# Patient Record
Sex: Male | Born: 1945 | Race: White | Hispanic: No | Smoking: Former smoker
Health system: Southern US, Community
[De-identification: ages and names within clinical notes are randomized; demographics above are authoritative.]

## PROBLEM LIST (undated history)

## (undated) DIAGNOSIS — E119 Type 2 diabetes mellitus without complications: Secondary | ICD-10-CM

## (undated) DIAGNOSIS — C439 Malignant melanoma of skin, unspecified: Secondary | ICD-10-CM

## (undated) DIAGNOSIS — I10 Essential (primary) hypertension: Secondary | ICD-10-CM

## (undated) DIAGNOSIS — G8929 Other chronic pain: Secondary | ICD-10-CM

## (undated) DIAGNOSIS — M069 Rheumatoid arthritis, unspecified: Secondary | ICD-10-CM

## (undated) DIAGNOSIS — Z9889 Other specified postprocedural states: Secondary | ICD-10-CM

## (undated) DIAGNOSIS — M549 Dorsalgia, unspecified: Secondary | ICD-10-CM

## (undated) DIAGNOSIS — I719 Aortic aneurysm of unspecified site, without rupture: Secondary | ICD-10-CM

## (undated) DIAGNOSIS — Z8719 Personal history of other diseases of the digestive system: Secondary | ICD-10-CM

## (undated) DIAGNOSIS — R112 Nausea with vomiting, unspecified: Secondary | ICD-10-CM

## (undated) DIAGNOSIS — K219 Gastro-esophageal reflux disease without esophagitis: Secondary | ICD-10-CM

## (undated) DIAGNOSIS — E785 Hyperlipidemia, unspecified: Secondary | ICD-10-CM

## (undated) DIAGNOSIS — K224 Dyskinesia of esophagus: Secondary | ICD-10-CM

## (undated) HISTORY — PX: NASAL SEPTUM SURGERY: SHX37

## (undated) HISTORY — PX: ESOPHAGOGASTRODUODENOSCOPY (EGD) WITH ESOPHAGEAL DILATION: SHX5812

## (undated) HISTORY — PX: OTHER SURGICAL HISTORY: SHX169

## (undated) HISTORY — PX: MELANOMA EXCISION: SHX5266

## (undated) HISTORY — DX: Aortic aneurysm of unspecified site, without rupture: I71.9

## (undated) HISTORY — PX: CARPAL TUNNEL RELEASE: SHX101

## (undated) HISTORY — PX: HAND SURGERY: SHX662

## (undated) HISTORY — DX: Type 2 diabetes mellitus without complications: E11.9

---
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
## 2004-04-14 ENCOUNTER — Inpatient Hospital Stay (HOSPITAL_COMMUNITY): Admission: EM | Admit: 2004-04-14 | Discharge: 2004-04-18 | Payer: Self-pay | Admitting: Emergency Medicine

## 2004-05-05 ENCOUNTER — Encounter: Admission: RE | Admit: 2004-05-05 | Discharge: 2004-05-05 | Payer: Self-pay | Admitting: General Surgery

## 2004-05-15 ENCOUNTER — Encounter: Admission: RE | Admit: 2004-05-15 | Discharge: 2004-05-15 | Payer: Self-pay | Admitting: General Surgery

## 2004-06-22 ENCOUNTER — Inpatient Hospital Stay (HOSPITAL_COMMUNITY): Admission: RE | Admit: 2004-06-22 | Discharge: 2004-06-27 | Payer: Self-pay | Admitting: General Surgery

## 2004-06-22 ENCOUNTER — Encounter (INDEPENDENT_AMBULATORY_CARE_PROVIDER_SITE_OTHER): Payer: Self-pay | Admitting: Specialist

## 2004-08-11 ENCOUNTER — Encounter: Admission: RE | Admit: 2004-08-11 | Discharge: 2004-08-11 | Payer: Self-pay | Admitting: General Surgery

## 2004-12-27 HISTORY — PX: TIBIA FRACTURE SURGERY: SHX806

## 2005-10-23 ENCOUNTER — Inpatient Hospital Stay (HOSPITAL_COMMUNITY): Admission: EM | Admit: 2005-10-23 | Discharge: 2005-10-27 | Payer: Self-pay | Admitting: Emergency Medicine

## 2005-10-23 ENCOUNTER — Ambulatory Visit: Payer: Self-pay | Admitting: Physical Medicine & Rehabilitation

## 2005-10-27 ENCOUNTER — Ambulatory Visit: Payer: Self-pay | Admitting: Physical Medicine & Rehabilitation

## 2005-10-27 ENCOUNTER — Inpatient Hospital Stay (HOSPITAL_COMMUNITY)
Admission: RE | Admit: 2005-10-27 | Discharge: 2005-11-05 | Payer: Self-pay | Admitting: Physical Medicine & Rehabilitation

## 2005-11-26 HISTORY — PX: ERCP W/ METAL STENT PLACEMENT: SHX1521

## 2005-12-13 ENCOUNTER — Ambulatory Visit: Payer: Self-pay | Admitting: Internal Medicine

## 2005-12-13 ENCOUNTER — Encounter (INDEPENDENT_AMBULATORY_CARE_PROVIDER_SITE_OTHER): Payer: Self-pay | Admitting: *Deleted

## 2005-12-13 ENCOUNTER — Inpatient Hospital Stay (HOSPITAL_COMMUNITY): Admission: EM | Admit: 2005-12-13 | Discharge: 2005-12-23 | Payer: Self-pay | Admitting: Emergency Medicine

## 2005-12-14 ENCOUNTER — Ambulatory Visit: Payer: Self-pay | Admitting: Pulmonary Disease

## 2005-12-27 HISTORY — PX: COLON SURGERY: SHX602

## 2006-01-27 HISTORY — PX: LAPAROSCOPIC CHOLECYSTECTOMY: SUR755

## 2006-02-07 ENCOUNTER — Encounter (INDEPENDENT_AMBULATORY_CARE_PROVIDER_SITE_OTHER): Payer: Self-pay | Admitting: *Deleted

## 2006-02-07 ENCOUNTER — Inpatient Hospital Stay (HOSPITAL_COMMUNITY): Admission: RE | Admit: 2006-02-07 | Discharge: 2006-02-12 | Payer: Self-pay | Admitting: General Surgery

## 2006-02-28 ENCOUNTER — Encounter: Admission: RE | Admit: 2006-02-28 | Discharge: 2006-05-03 | Payer: Self-pay | Admitting: Orthopedic Surgery

## 2006-03-05 ENCOUNTER — Emergency Department (HOSPITAL_COMMUNITY): Admission: EM | Admit: 2006-03-05 | Discharge: 2006-03-05 | Payer: Self-pay | Admitting: Emergency Medicine

## 2006-03-10 ENCOUNTER — Ambulatory Visit (HOSPITAL_COMMUNITY): Admission: RE | Admit: 2006-03-10 | Discharge: 2006-03-10 | Payer: Self-pay | Admitting: Gastroenterology

## 2006-03-23 ENCOUNTER — Encounter: Admission: RE | Admit: 2006-03-23 | Discharge: 2006-03-23 | Payer: Self-pay | Admitting: Orthopedic Surgery

## 2006-04-29 ENCOUNTER — Encounter: Admission: RE | Admit: 2006-04-29 | Discharge: 2006-04-29 | Payer: Self-pay | Admitting: Gastroenterology

## 2006-05-02 ENCOUNTER — Encounter: Admission: RE | Admit: 2006-05-02 | Discharge: 2006-05-02 | Payer: Self-pay | Admitting: Orthopedic Surgery

## 2006-05-09 ENCOUNTER — Encounter: Admission: RE | Admit: 2006-05-09 | Discharge: 2006-07-21 | Payer: Self-pay | Admitting: Orthopedic Surgery

## 2006-05-18 ENCOUNTER — Encounter: Admission: RE | Admit: 2006-05-18 | Discharge: 2006-05-18 | Payer: Self-pay | Admitting: Gastroenterology

## 2006-05-31 ENCOUNTER — Encounter (INDEPENDENT_AMBULATORY_CARE_PROVIDER_SITE_OTHER): Payer: Self-pay | Admitting: Specialist

## 2006-05-31 ENCOUNTER — Ambulatory Visit (HOSPITAL_COMMUNITY): Admission: RE | Admit: 2006-05-31 | Discharge: 2006-05-31 | Payer: Self-pay | Admitting: Gastroenterology

## 2006-08-22 ENCOUNTER — Encounter: Admission: RE | Admit: 2006-08-22 | Discharge: 2006-10-24 | Payer: Self-pay | Admitting: Orthopedic Surgery

## 2006-09-02 ENCOUNTER — Inpatient Hospital Stay (HOSPITAL_COMMUNITY): Admission: EM | Admit: 2006-09-02 | Discharge: 2006-09-04 | Payer: Self-pay | Admitting: Emergency Medicine

## 2006-09-02 ENCOUNTER — Ambulatory Visit: Payer: Self-pay | Admitting: Internal Medicine

## 2006-09-04 IMAGING — CR DG ANKLE COMPLETE 3+V*R*
3 series · 3 of 3 positions shown · non-contrast
Comparison: none

CLINICAL DATA: Trauma, fall from ladder with a left femur fracture, status post open reduction internal fixation. The patient also has right ankle pain.  
RIGHT ANKLE - 3 VIEW:

[t ankle joint ap right]
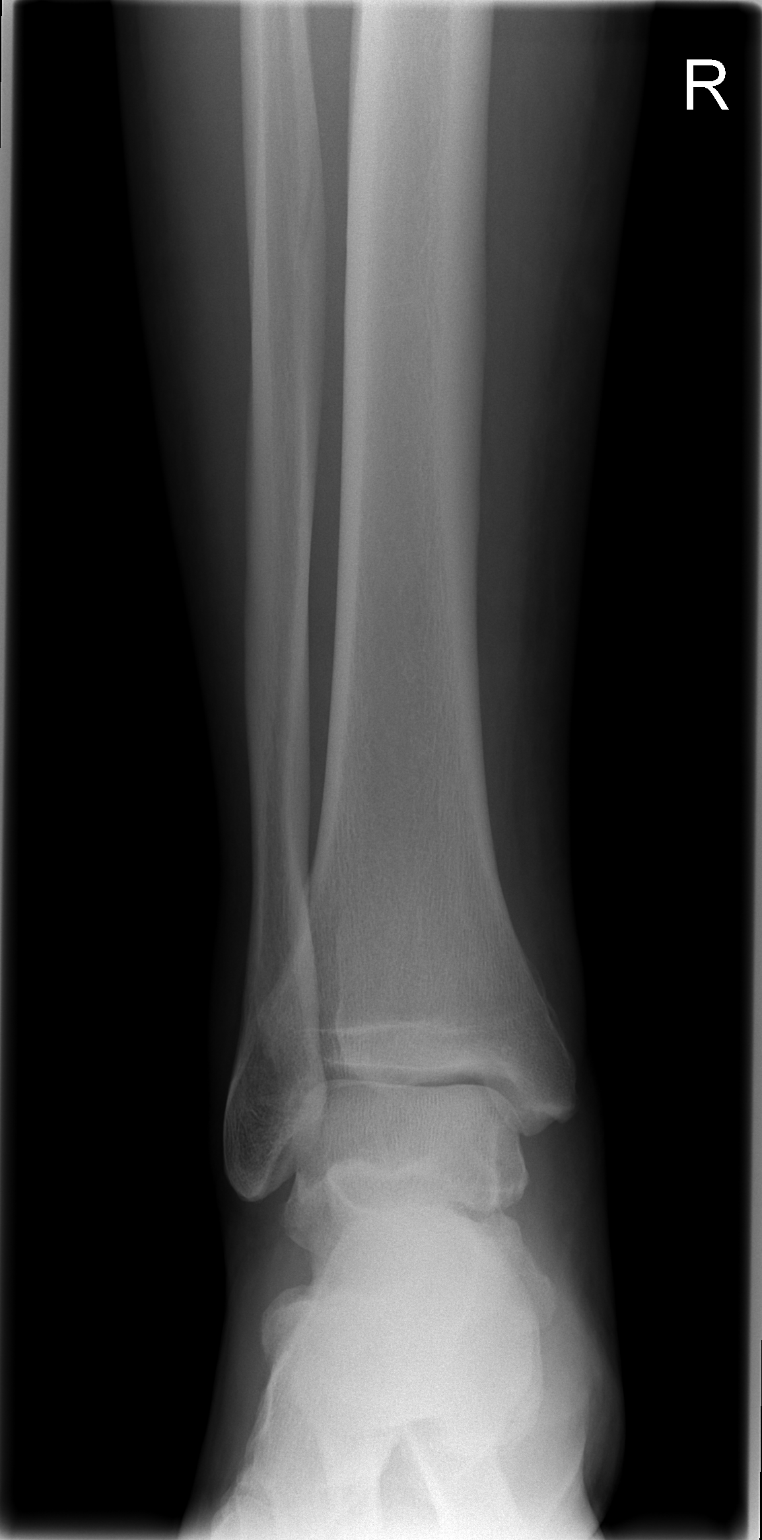

[t ankle joint oblique right]
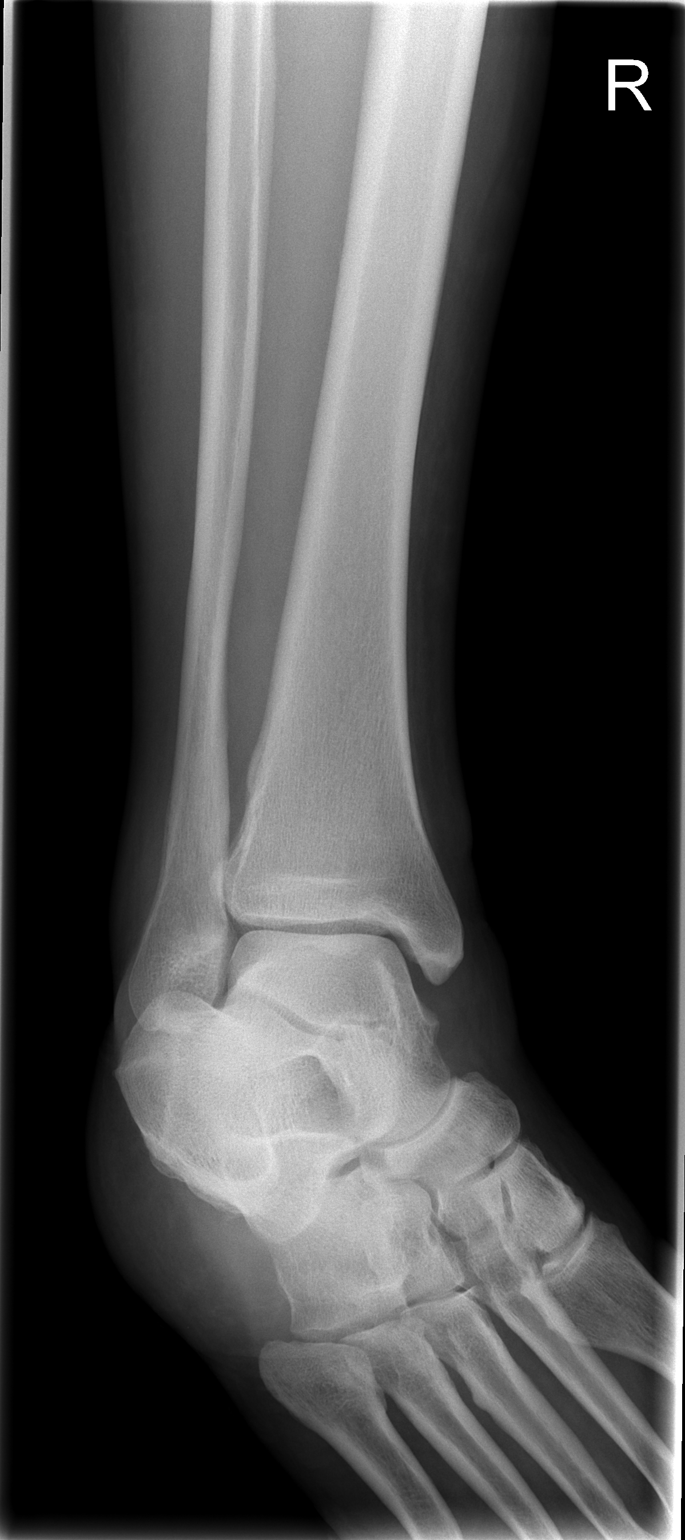

[t ankle joint lat right]
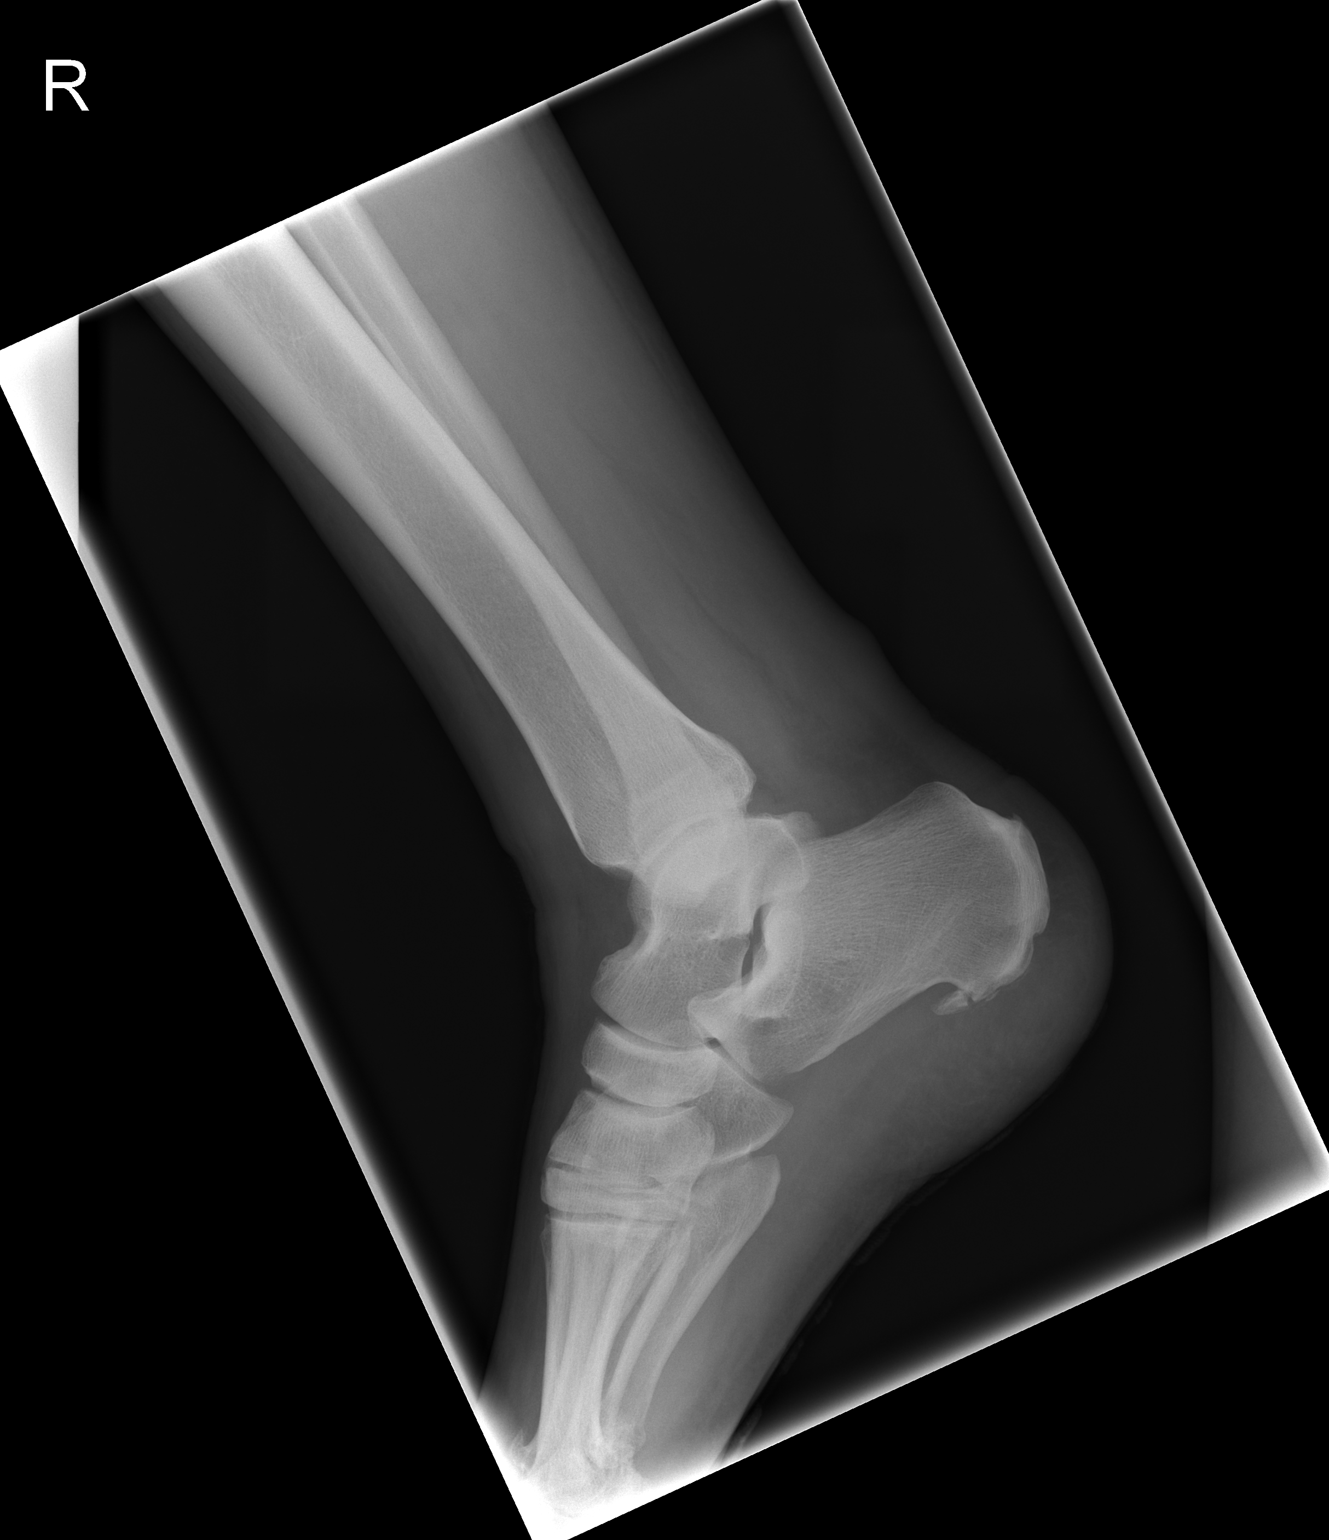

[3 of 3 positions shown; findings below may reference images not displayed]

FINDINGS: Normal bone density and alignment.  The ankle mortise and talar dome are intact.   Along the plantar surface of the calcaneus, there is a small minimally displaced fracture of a plantar calcaneal spur.  This is best demonstrated on the lateral view.
IMPRESSION: Right calcaneal plantar spur fracture.

## 2006-10-21 IMAGING — CR DG CHEST 1V PORT
1 series · 1 of 1 positions shown · non-contrast
Comparison: 12/18/05.

CLINICAL DATA: Respiratory failure.  
 PORTABLE SINGLE VIEW CHEST - 12/19/05 AT 8388 HOURS:

[view not recorded]
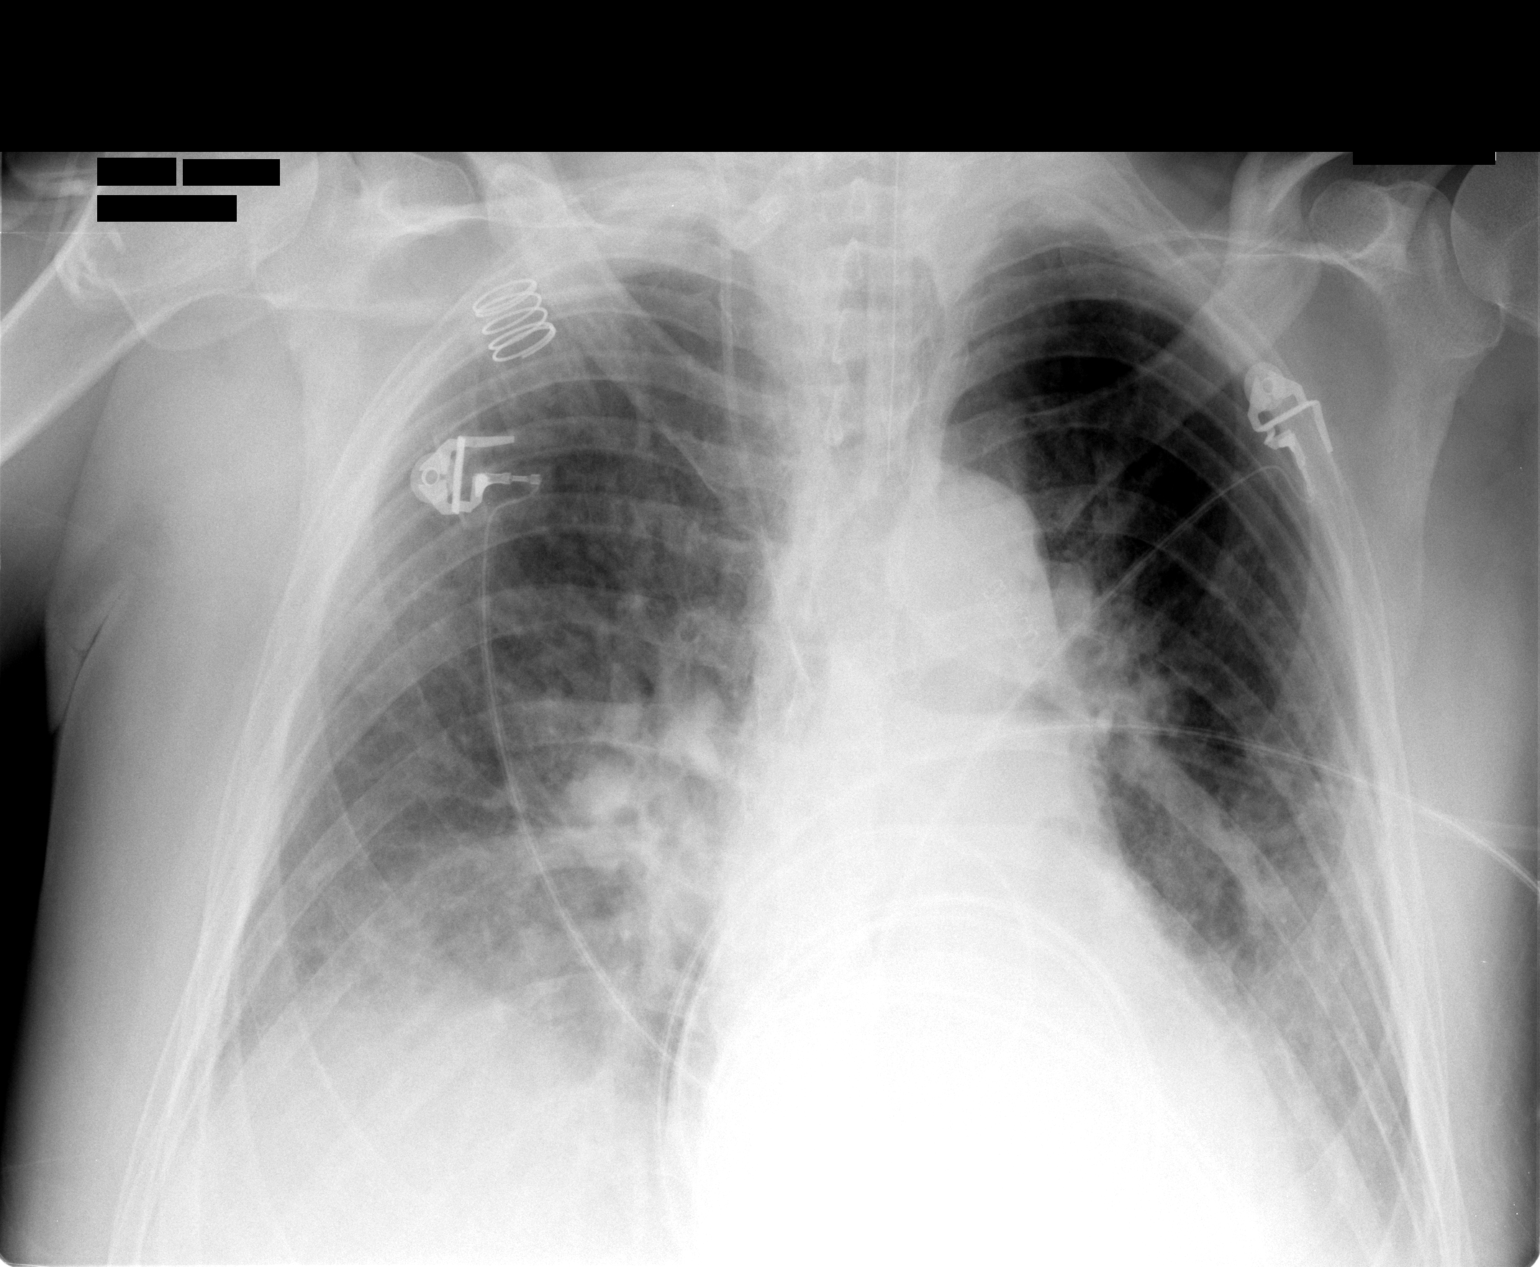

[1 of 1 positions shown; findings below may reference images not displayed]

FINDINGS: There is stable positioning of the endotracheal tube and right jugular central line.  Overall edema pattern has worsened since the prior study and there also is increase in atelectasis involving both lower lobes, left greater than right.
IMPRESSION: Increase in edema.  Increase in bilateral lower lobe atelectasis, left greater than right.

## 2006-10-22 IMAGING — CR DG CHEST 1V PORT
1 series · 1 of 1 positions shown · non-contrast
Comparison: 12/19/05.
 PORTABLE CHEST - 1 VIEW:

CLINICAL DATA: Pulmonary edema and bibasilar atelectasis.

[view not recorded]
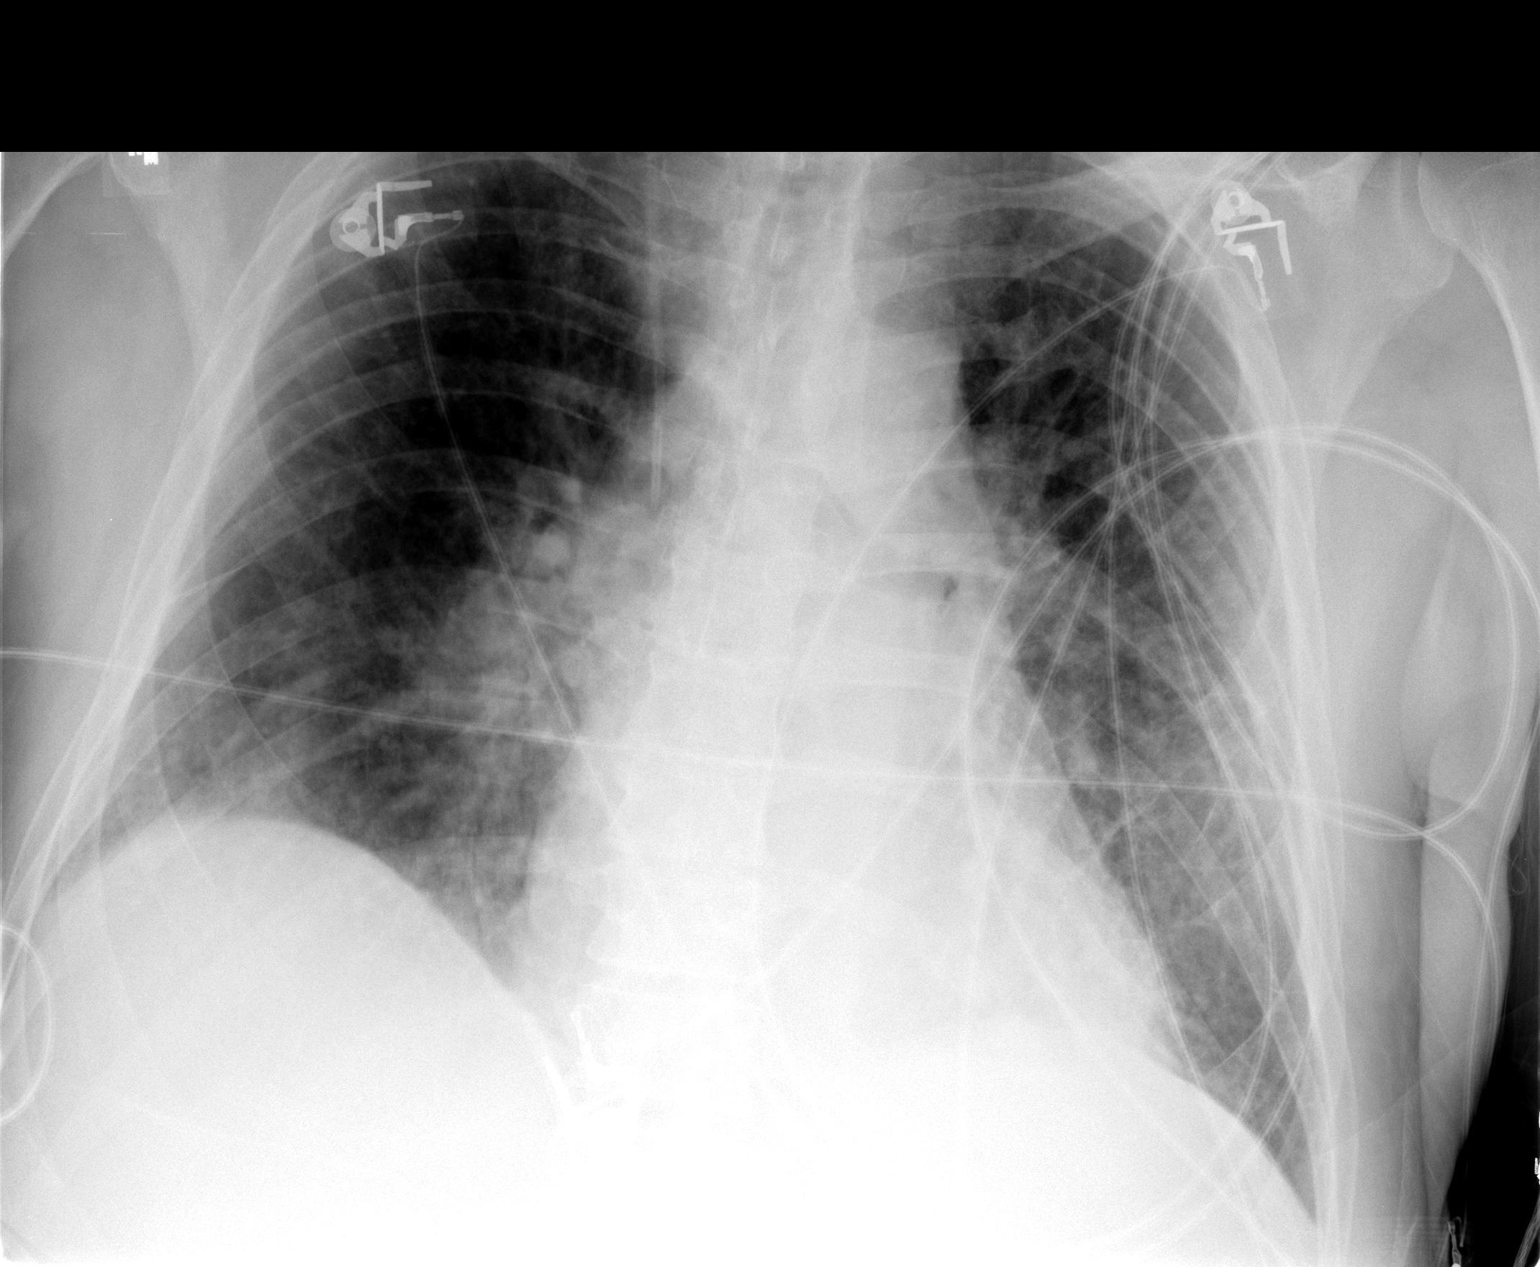

[1 of 1 positions shown; findings below may reference images not displayed]

FINDINGS: Endotracheal tube has been removed.  Central venous catheter tip overlies the superior vena cava, unchanged.  There is improved aeration at both bases with some mild residual atelectasis with slight residual vascular prominence.
IMPRESSION: Significant improvement in the edema and atelectasis.

## 2007-09-21 ENCOUNTER — Encounter: Admission: RE | Admit: 2007-09-21 | Discharge: 2007-09-21 | Payer: Self-pay | Admitting: Otolaryngology

## 2008-07-04 ENCOUNTER — Emergency Department (HOSPITAL_COMMUNITY): Admission: EM | Admit: 2008-07-04 | Discharge: 2008-07-04 | Payer: Self-pay | Admitting: Emergency Medicine

## 2008-09-23 ENCOUNTER — Ambulatory Visit: Payer: Self-pay | Admitting: Internal Medicine

## 2008-09-26 ENCOUNTER — Ambulatory Visit: Payer: Self-pay | Admitting: Internal Medicine

## 2008-11-11 ENCOUNTER — Ambulatory Visit: Payer: Self-pay | Admitting: Internal Medicine

## 2008-11-26 ENCOUNTER — Ambulatory Visit: Payer: Self-pay | Admitting: Internal Medicine

## 2008-12-27 ENCOUNTER — Ambulatory Visit: Payer: Self-pay | Admitting: Internal Medicine

## 2009-01-27 ENCOUNTER — Ambulatory Visit: Payer: Self-pay | Admitting: Internal Medicine

## 2009-02-24 ENCOUNTER — Ambulatory Visit: Payer: Self-pay | Admitting: Internal Medicine

## 2009-03-27 ENCOUNTER — Ambulatory Visit: Payer: Self-pay | Admitting: Internal Medicine

## 2009-04-26 ENCOUNTER — Ambulatory Visit: Payer: Self-pay | Admitting: Internal Medicine

## 2009-05-27 ENCOUNTER — Ambulatory Visit: Payer: Self-pay | Admitting: Internal Medicine

## 2009-06-16 ENCOUNTER — Ambulatory Visit: Payer: Self-pay

## 2009-06-26 ENCOUNTER — Ambulatory Visit: Payer: Self-pay | Admitting: Internal Medicine

## 2009-07-27 ENCOUNTER — Ambulatory Visit: Payer: Self-pay | Admitting: Internal Medicine

## 2009-08-27 ENCOUNTER — Ambulatory Visit: Payer: Self-pay | Admitting: Internal Medicine

## 2009-09-08 ENCOUNTER — Ambulatory Visit: Payer: Self-pay

## 2009-09-26 ENCOUNTER — Ambulatory Visit: Payer: Self-pay | Admitting: Internal Medicine

## 2009-11-03 ENCOUNTER — Ambulatory Visit: Payer: Self-pay | Admitting: Internal Medicine

## 2009-11-26 ENCOUNTER — Ambulatory Visit: Payer: Self-pay | Admitting: Internal Medicine

## 2009-12-27 ENCOUNTER — Ambulatory Visit: Payer: Self-pay | Admitting: Internal Medicine

## 2010-01-27 ENCOUNTER — Ambulatory Visit: Payer: Self-pay | Admitting: Internal Medicine

## 2010-02-24 ENCOUNTER — Ambulatory Visit: Payer: Self-pay | Admitting: Internal Medicine

## 2010-03-30 ENCOUNTER — Ambulatory Visit: Payer: Self-pay | Admitting: Internal Medicine

## 2010-04-26 ENCOUNTER — Ambulatory Visit: Payer: Self-pay | Admitting: Internal Medicine

## 2010-05-27 ENCOUNTER — Ambulatory Visit: Payer: Self-pay | Admitting: Internal Medicine

## 2010-06-26 ENCOUNTER — Ambulatory Visit: Payer: Self-pay | Admitting: Internal Medicine

## 2010-07-27 ENCOUNTER — Ambulatory Visit: Payer: Self-pay | Admitting: Internal Medicine

## 2010-08-03 ENCOUNTER — Ambulatory Visit: Payer: Self-pay | Admitting: Family Medicine

## 2010-08-27 ENCOUNTER — Ambulatory Visit: Payer: Self-pay | Admitting: Internal Medicine

## 2010-09-26 ENCOUNTER — Ambulatory Visit: Payer: Self-pay | Admitting: Internal Medicine

## 2010-10-27 ENCOUNTER — Ambulatory Visit: Payer: Self-pay | Admitting: Internal Medicine

## 2010-11-26 ENCOUNTER — Ambulatory Visit: Payer: Self-pay | Admitting: Internal Medicine

## 2010-12-27 ENCOUNTER — Ambulatory Visit: Payer: Self-pay | Admitting: Internal Medicine

## 2011-01-16 ENCOUNTER — Encounter: Payer: Self-pay | Admitting: Gastroenterology

## 2011-01-27 ENCOUNTER — Ambulatory Visit: Payer: Self-pay | Admitting: Internal Medicine

## 2011-02-10 ENCOUNTER — Ambulatory Visit: Payer: Self-pay

## 2011-02-25 ENCOUNTER — Ambulatory Visit: Payer: Self-pay | Admitting: Internal Medicine

## 2011-03-28 ENCOUNTER — Ambulatory Visit: Payer: Self-pay | Admitting: Internal Medicine

## 2011-04-27 ENCOUNTER — Ambulatory Visit: Payer: Self-pay | Admitting: Internal Medicine

## 2011-05-14 NOTE — Discharge Summary (Signed)
[name], [name]            ACCOUNT NO.:  [id number]   MEDICAL RECORD NO.:  [id number]          PATIENT TYPE:  IPS   LOCATION:  4033                         FACILITY:  [facility]   PHYSICIAN:  Erick Colace, M.D.DATE OF BIRTH:  04/04/1946   DATE OF ADMISSION:  10/27/2005  DATE OF DISCHARGE:  11/05/2005                                 DISCHARGE SUMMARY   ADDENDUM:  During final plans of discharge planning, a venous Doppler study  had been completed of the patient's left lower extremity due to some mild  swelling as well as his recent history of proximal left femur fracture of  October 23, 2005.  Findings of venous Doppler studies on the left showed no  evidence of DVT; there was a superficial thrombosis of the LSV from proximal  calf to the ankle.  A small amount of fluid was noted in the popliteal  fossa, sluggish flow noted in the PTV.  The right showed no evidence of deep  venous thrombosis.  Due to these findings, his INR on day of venous Doppler  study, November 04, 2005, was 1.7.  He was placed on therapeutic doses of  subcutaneous Lovenox due to findings of DVT.  Dr. Sherlyn Lick, [phone], was  notified of the patient's deep venous thrombosis and planned discharge, and  with these findings, graciously accepted to follow Coumadin, which the  patient would remain on for approximately 3 months.  A home health nurse  would be arranged for necessary blood draws, results to Dr. Sherlyn Lick, as well  as medical management.  On day of discharge, his INR was 2.0.  It was felt  that his subcutaneous Lovenox could be discontinued.  He would be discharged  on 7.5 mg of Coumadin to be ongoing until the date of January 23, 2005 and  could be discontinued at that time with followup venous Doppler studies.      Mariam Dollar, P.A.      Erick Colace, M.D.  Electronically Signed    DA/MEDQ  D:  11/05/2005  T:  11/05/2005  Job:  [id number]

## 2011-05-14 NOTE — Consult Note (Signed)
NAMEPEARLY, APACHITO            ACCOUNT NO.:  [id number]   MEDICAL RECORD NO.:  [id number]          PATIENT TYPE:  INP   LOCATION:  2914                         FACILITY:  [facility]   PHYSICIAN:  Maree Krabbe, M.D.DATE OF BIRTH:  Feb 13, 1946   DATE OF CONSULTATION:  12/17/2005  DATE OF DISCHARGE:                                   CONSULTATION   RENAL CONSULT   PRIMARY PHYSICIAN:  Dr. Lowella Bandy.   IMPRESSION:  1.  Acute renal failure secondary to shock and acute tubular necrosis.      Creatinine has risen from 0.7 to 3.4 over the last several days.  2.  Volume excess without pulmonary edema.  3.  Acute cholangitis with cholelithiasis, status post ERCP and stent      placement.  4.  History of rheumatoid arthritis on prednisone, methotrexate and Humira.  5.  Septic shock, resolved.   RECOMMENDATIONS:  Continue supportive care for now.  With resolving sepsis  and improving urine output, I would expect renal recovery relatively soon,  and hopefully we will avoid need for any hemodialysis at all.  Agree with  current fluids and continue to avoid nephrotoxins.  I do not see any need  for antibiotic dosing adjustment.  Will follow with you.   HISTORY:  A 65 year old white male with history of rheumatoid arthritis, no  prior renal history, who was admitted with high fever, temperature of 105,  tender right upper quadrant, jaundice, and a normal creatinine of 0.7 on  December 18.  Within the first 24 hours of admission he had acute decline  with hypotension and eventually required intubation.  He underwent ERCP with  sphincterotomy and stent placement within the first 48 hours of admission  and has been in the ICU on vasopressors and has got multiple liters of extra  fluid over the past couple of days.  His creatinine has been rising, up to  3.1 yesterday and 3.4 today.  Urine output was the poorest with the lowest  blood pressure but with improving sepsis and blood pressure  currently  140/80, urine output is about 300 mL every 8 hours.  CVP has ranged from 18  to 20.  Chest x-ray shows no pulmonary edema.  The patient is sedated, on  the ventilator.  Liver enzymes have been improving.  Blood cultures grew  enterobacter cloacae.  Also, the patient was recently treated for a left  femur fracture due to a fall on October 28 with ORIF and was receiving  Coumadin.   PAST MEDICAL HISTORY:  1.  History of rheumatoid arthritis on Humira and prednisone.  2.  GERD.  3.  History of lung nodule, followed at [facility].  4.  Diverticulitis.  5.  Left femur fracture October 28, status post ORIF.  6.  Hyperlipidemia.  7.  Left foot neuropathy.  8.  History of prior sigmoidectomy.   SOCIAL HISTORY:  Divorced, Corporate investment banker, prior tobacco use,  occasional alcohol.   FAMILY HISTORY:  No renal disease.   MEDICATIONS:  Methotrexate, Humira, prednisone, on admission, Coumadin,  Vicodin, folate, Lipitor, Prevacid, methocarbamol.   REVIEW  OF SYSTEMS:  Unavailable currently.   PHYSICAL EXAMINATION:  VITAL SIGNS:  Temperature 99.4, blood pressure  130/80, respirations 16, the patient is on a ventilator and sedated.  FIO2  is 0.35, CVP is 15-20.  SKIN:  Without rash.  HEENT:  Pupils are equally round and reactive to light.  CHEST:  Clear throughout.  NECK:  No JVD.  CARDIAC:  Regular rate and rhythm without murmur, rub or gallop.  ABDOMEN:  Soft, nontender, nondistended.  There is a lower midline scar.  EXTREMITIES:  Lower extremities with 2+ edema, upper extremities 1+ edema.  NEUROLOGIC:  Not testable.   LABS:  Hemoglobin 9, white blood count 19,000, platelets 28,000.  Sodium  145, potassium 3.7, CO2 26, BUN 69, creatinine 3.4.  Glucose 99, calcium  7.8.   Chest x-ray:  No pulmonary edema.      Maree Krabbe, M.D.  Electronically Signed     RDS/MEDQ  D:  12/17/2005  T:  12/20/2005  Job:  [id number]   cc:   C. Ulyess Mort, M.D.  Fax: [phone]

## 2011-05-14 NOTE — Discharge Summary (Signed)
NAMEHOY, FALLERT            ACCOUNT NO.:  [id number]   MEDICAL RECORD NO.:  [id number]          PATIENT TYPE:  INP   LOCATION:  3711                         FACILITY:  [facility]   PHYSICIAN:  Edsel Petrin, D.O.DATE OF BIRTH:  January 05, 1946   DATE OF ADMISSION:  09/02/2006  DATE OF DISCHARGE:  09/04/2006                                 DISCHARGE SUMMARY   DISCHARGE DIAGNOSES:  1. Acute pancreatitis.  2. Hyperlipidemia.  3. Rheumatoid arthritis.  4. Alcohol abuse.  5. Gastroesophageal reflux disease.  6. Left pulmonary nodule, being followed by CT.  7. History of tobacco abuse.  8. History of femur fracture.  9. History of cholecystectomy in February of 2007.  10.History of acute cholangitis, December of 2006.  11.History of vent-dependent respiratory failure and septic shock in      December of 2006.  12.History of sigmoidectomy for severe diverticulosis in 2005.  13.Status post nasal surgery   DISCHARGE MEDICATIONS:  1. Prevacid 30 mg p.o. daily.  2. Methotrexate 1.2 mg once daily.  3. Omnicef 300 mg p.o. daily.  4. Oxycodone 5 mg p.o. q.4 hours p.r.n. for pain.  5. Prednisone 5 mg p.o. daily.  6. Potassium chloride 30 mEq  daily.  7. Orencia as prescribed by rheumatologist for RA.  8. Lipitor 40 mg p.o. daily.   DISPOSITION/HOSPITAL FOLLOWUP:  The patient was discharged in good condition  to his private residence.  On the day of discharge, the patient stated that  he felt much better and desired to finish his recovery at home.  He was  advised to advance his diet slowly. He did demonstrate taking p.o. well in  the hospital.  He was also told to schedule an appointment with Dr.  Teresita Madura, who is his primary doctor and following him for his rheumatoid  arthritis.  At his hospital followup, he will need to have his triglycerides  checked.  I advised him to continue his at home Lipitor.  He was also  discharged with pain medication.  The patient was told that if he  has a  serious increase in abdominal pain, develops fever, chills or nausea,  vomiting that he should return to the emergency room for evaluation.   PROCEDURES PERFORMED:  CT abdomen, showed diffuse inflammatory changes of  the pancreas with fluid extending into the retroperitoneal space  bilaterally, compatible with pancreatitis.  Secondary inflammatory change to  the second portion of duodenum were also seen.  He is status post  cholecystectomy.  He has a stable right renal cyst.  There is also a left  renal calculus noted.  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   BRIEF ADMITTING H&P:  Mr. Girouard is a pleasant 65 year old man with a past  medical history significant for acute cholangitis in December of 2006,  complicated by septic shock, which required ventilation support.  He also  had an ERCP with bile duct stent placement and is status post elective  cholecystectomy in February of 2007.  He came into the emergency department  complaining of nausea, vomiting and severe acute abdominal pain for  one day,  which had gotten to the point where he could not tolerate any food or the  pain.  The day before his admission, he had ENT surgery for septal  deviation. At the time of presentation, he had nasal packing in place and  was complaining of a headache related to this recent surgery.  He denies the  use of alcohol or the abuse of alcohol.  He states that he has abused  alcohol in the past, but only drinks occasionally at this time.  He  described his pain as 10/10, sharp, epigastric.  He denies any chest pain,  shortness of breath, any fever or chills.  He denied any prior diagnoses of  pancreatitis; however, he states that he has had similar pain in the past,  but which has resolved for after about a week.   HOSPITAL COURSE:  1. Abdominal pain, Acute pancreatitis:  This diagnosis was confirmed by      the findings on CT scan.  The patient was  afebrile.  He was placed on      n.p.o.  He was given IV fluid resuscitation.  He was started on      Dilaudid PCA.  He was given Zofran for nausea.  On day number 2 of his      hospitalization, he felt well enough to begin p.o. intake.  He      requested to leave the hospital on day number 2 and since he was      tolerating his food and had a significant decrease in his pain, he was      discharged.  2. Headache, status post nasal septal surgery:  ENT was contacted.  They      came to the bedside to remove the nasal packing per his surgeon at      [facility].  He will complete followup post surgery with his ENT surgeon.  3. History of GERD:  Patient was given IV Protonix and discharged on      Prevacid.  4. Rheumatoid arthritis:  Patient was continued on Solu-Medrol 40 mg IV      and on discharge was continued on his outpatient home medication      regimen.      Edsel Petrin, D.O.  Electronically Signed     ELG/MEDQ  D:  09/22/2006  T:  09/22/2006  Job:  [id number]   cc:   Mikael Spray, M.D.  Petra Kuba, M.D.

## 2011-05-14 NOTE — Op Note (Signed)
[name], [name]            ACCOUNT NO.:  [id number]   MEDICAL RECORD NO.:  [id number]          PATIENT TYPE:  INP   LOCATION:  2914                         FACILITY:  [facility]   PHYSICIAN:  Petra Kuba, M.D.    DATE OF BIRTH:  [birth year]   DATE OF PROCEDURE:  12/14/2005  DATE OF DISCHARGE:                                 OPERATIVE REPORT   PROCEDURE:  Endoscopic retrograde cholangiopancreatogram, sphincterotomy and  stent placement.   ENDOSCOPIST:  Petra Kuba, M.D.   INDICATIONS:  Patient with probable cholangitis.  Consent was signed after  risks, benefits, methods and options were thoroughly discussed by Dr.  Randa Evens in by me prior to any sedation given.   MEDICINES USED THROUGH THE PROCEDURE:  Fentanyl 25 mcg, Versed 4.5 mg.   PROCEDURE:  The side-viewing therapeutic video duodenoscope was inserted by  indirect vision into the stomach through a normal antrum and normal pylorus  into a normal duodenum and a normal-appearing ampulla was brought into view.  Because we had to keep him on his left side, unable to roll him on his  belly, long position was used.  Unfortunately, we could cannulate the PD  readily; it was normal on a few injections.  We had poor fluoro help, being  that it had to be done at the bedside.  We did try to get better position  and multiple attempts using the triple-lumen sphincterotome and Jag wire,  but were unsuccessful.  We then tried the taper sphincterotome loaded with  the 0.025 wire.  Again. after a few more PD injections and advancements of  the wire, we left the wire in the pancreatic duct, removed the  sphincterotome and then went ahead and then we were able to freely cannulate  the CBD using the triple-lumen sphincterotome.  The CBD was slightly  dilated, but we could not get good fluoro pictures, based on his critical  state, did not overfill the ducts or intrahepatics.  The Jag wire, 0.035,  was advanced into the intrahepatics.   Because of the long position and  difficulty in getting deep selective cannulation, possibly due to a distal  CBD stricture, we went ahead and proceeded with a small sphincterotomy.  Despite his elevated PT, no significant heme was seen.  We then proceeded  with a 10-French 5-cm stent placement using the Oasis system over the wire  in the customary fashion.  Once we had obtained deep selective cannulation  into the CBD, the PD wire was removed, not mentioned above.  There was  adequate biliary drainage.  Once the stent was placed, the introducer was  removed.  The proper position in the stent was confirmed under fluoro.  Scope was removed.  The patient tolerated the procedure well.  There were no  obvious immediate complications.   ENDOSCOPIC DIAGNOSES:  1.  Normal ampulla.  2.  Normal PD on a few injections, not overfilled, did leave the 0.025 wire      in the PD for short time in order to help cannulate the common bile      duct.  3.  Common bile duct not well seen secondary to scope position, patient      position and using the C-arm at the bedside, did not overfill the duct.  4.  Status post small sphincterotomy.  5.  10-French 5-cm stents placed with adequate biliary drainage using the      Oasis system.   PLAN:  Observe for delayed complications.  Note:  The patient had an  increased lipase earlier today and will treat him medically for now.  Dr.  Randa Evens, my partner, will follow with you and call me p.r.n.           ______________________________  Petra Kuba, M.D.     MEM/MEDQ  D:  12/14/2005  T:  12/16/2005  Job:  [id number]   cc:   C. Ulyess Mort, M.D.  Fax: [phone]   Gabrielle Dare. Janee Morn, M.D.  [facility]  [address] [place], [place] [address]   Llana Aliment. Malon Kindle., M.D.  Fax: [phone]

## 2011-05-14 NOTE — Discharge Summary (Signed)
[name], [name]            ACCOUNT NO.:  [id number]   MEDICAL RECORD NO.:  [id number]          PATIENT TYPE:  IPS   LOCATION:  4033                         FACILITY:  [facility]   PHYSICIAN:  Erick Colace, M.D.DATE OF BIRTH:  22-Jun-1946   DATE OF ADMISSION:  10/27/2005  DATE OF DISCHARGE:                                 DISCHARGE SUMMARY   ADDENDUM:  After fully discussing the latest venous Doppler studies of a  superficial thrombosis to the left lower extremity proximal to the ankle, it  was felt this clot had not embolized causing any decreased blood flow.  Thus, the anticipated need for prolonged Coumadin was discontinued.  He  would complete prophylaxis of Coumadin on November 23, 2005.  He still would  have a home health nurse to monitor Coumadin therapy.  Dr. Sherlyn Lick [facility]  would still be contacted due to these latest changes on Coumadin therapy,  and anticipated need for only Coumadin for prophylaxis.      Mariam Dollar, P.A.      Erick Colace, M.D.  Electronically Signed    DA/MEDQ  D:  11/05/2005  T:  11/05/2005  Job:  [id number]

## 2011-05-14 NOTE — Consult Note (Signed)
NAMEBROUGHTON, EPPINGER            ACCOUNT NO.:  [id number]   MEDICAL RECORD NO.:  [id number]          PATIENT TYPE:  INP   LOCATION:  2914                         FACILITY:  [facility]   PHYSICIAN:  Fayrene Fearing L. Malon Kindle., M.D.DATE OF BIRTH:  October 14, 1946   DATE OF CONSULTATION:  12/14/2005  DATE OF DISCHARGE:                                   CONSULTATION   REFERRING PHYSICIAN:  C. Ulyess Mort, M.D., teaching service.   HISTORY:  [name] is a nice 65 year old gentleman with no known history  of gallbladder disease.  He was hospitalized recently for fracture of his  femur and was discharged on November 1.  He was going to rehab and is on  Coumadin for this.  He presented to the emergency room with nausea,  vomiting, diarrhea, cough, shortness of breath.  Thought he might have a  gastroenteritis.  He was admitted and workup since yesterday has revealed  the patient to be jaundiced with a bilirubin rising to 6, elevated liver  tests with transaminases over 200, and ultrasound showing extrahepatic  biliary dilatation and a positive Murphy sign.  The pancreatic duct was  slightly dilated.  There was marked cholelithiasis and pericholecystic  fluid.  There was a question of obstructing lesion in the distal common  duct.  The patient has become progressively hypotensive and had to have  central line insertion.  He was on dopamine, now has been switched over to  Levophed.  His urine output is minimal.  He has been seen and evaluated by  surgery, who feels that he has acute cholangitis and needs urgent biliary  decompression.   MEDICATIONS ON ADMISSION:  Coumadin, Vicodin, folate, Lipitor, Prevacid,  methotrexate, prednisone, methocarbamol and Humira.   He has no drug allergies.   MEDICAL HISTORY:  1.  Recent femur fracture undergoing rehab, is on Coumadin for this.  2.  History of rheumatoid arthritis, treated with Humira and low-dose      prednisone.  3.  History of GERD.  4.   History of a lung nodule.  5.  History of diverticulitis in the past with a previous sigmoidoscopy      negative.   FAMILY HISTORY:  Negative for gallstones.  This is obtained fro the patient  as well as from his aunt and uncle.   SOCIAL HISTORY:  He is divorced.  Works in [employer] and previously  smoked.  Drinks several drinks a week, not daily.   PHYSICAL EXAMINATION:  VITAL SIGNS:  Blood pressure currently 100/32 on  Levophed, temperature 98.3, pulse 120.  GENERAL:  Alert, well-nourished, middle-aged male who appears younger than  his stated age.  HEENT:  Eyes:  Sclerae icteric.  Mucous membranes dry.  LUNGS:  Clear anteriorly and posteriorly.  CARDIAC:  Heart with regular rate and rhythm, without murmurs or gallops.  ABDOMEN:  Devoid of bowel sounds.  Marked tenderness in the right upper  quadrant.   ASSESSMENT:  Acute cholangitis.  I agree the patient needs urgent  decompression.  I have had an extensive discussion with him and his uncle,  [name], and recommended endoscopic retrograde  cholangiopancreatography with stent placement and possible stone extraction.  The other possibilities of cholecystostomy tube or surgery were discussed,  and the procedure of endoscopic retrograde cholangiopancreatography were  explained, the possibilities of failure to cannulate or to remove the stone  and possible pancreatitis were all explained.   PLAN:  This will be performed later today by Dr. Ewing Schlein.  The patient and his  family's questions were answered.           ______________________________  Llana Aliment Malon Kindle., M.D.     Waldron Session  D:  12/14/2005  T:  12/16/2005  Job:  [id number]   cc:   C. Ulyess Mort, M.D.  Fax: [phone]

## 2011-05-14 NOTE — Discharge Summary (Signed)
NAME:  [name], [name]                      ACCOUNT NO.:  [id number]   MEDICAL RECORD NO.:  [id number]                   PATIENT TYPE:  INP   LOCATION:  5024                                 FACILITY:  [facility]   PHYSICIAN:  Isla Pence, M.D.             DATE OF BIRTH:  [birth year]   DATE OF ADMISSION:  04/14/2004  DATE OF DISCHARGE:  04/18/2004                                 DISCHARGE SUMMARY   The patient is leaving against medical advice on April 18, 2004.   DISCHARGE DIAGNOSES:  1. Sigmoid diverticulitis with small perforation that was contained.  The     patient had mild DIC.  2. History of rheumatoid arthritis.  3. History of gastroesophageal reflux disease.  4. History of mild hyperlipidemia.  5. History of colon polyps.  6. Mild hyponatremia and mild hypokalemia secondary to decreased oral     intake.  7. Noted to have left renal calculus that is nonobstructing on CAT scan.  8. Also noted to have right renal cyst on CAT scan.  9. 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  10.      Also noted is nodular density on the left base, which will need to     be followed up by his primary care physician, Dr. Sherlyn Lick, who the patient     tells is acting as his primary care physician.   DISCHARGE MEDICATIONS:  1. Augmentin 875 mg p.o. q.d.  2. Prednisone 5 mg p.o. q.d.  3. The patient is to continue his Prilosec 40 mg p.o. q.d.  4. The patient is to continue his folic acid p.o. q.d.  5. The patient is to hold on his Remicade and also methotrexate until he is     further advised by his rheumatologist.  This is secondary to his current     acute infectious process.   DISCHARGE ACTIVITY:  As tolerated.   DISCHARGE DIET:  The patient is to try a full liquid with his next meal and  if he tolerates that he will advance it to a regular high fibrous diet.   DISCHARGE FOLLOW-UP:  1. The patient has been advised to follow-up with Dr. Sherlyn Lick, who  is acting     as his primary care physician, who I understand is also a cardiologist in     [place], in one week.  2. He is to follow-up with Dr. Charm Barges, his gastroenterologist, in one week.  3. We also recommended follow-up with his rheumatologist at [facility] in one week     also.  4. Dr. Janee Morn, the general surgeon who was consulting on this patient, had     recommended a follow-up with him in one month and the patient was given     Dr. Carollee Massed number on the sheet that the patient was given from the     hospital.   HOSPITAL COURSE:  This 65 year old gentleman was admitted to the [facility] when he presented to the emergency room with 24-hour  history of left lower quadrant pain.  The patient apparently told the  admitting doctor he had some loose stools, but he denied diarrhea to me.  The patient denied any vomiting or melena.  He does have a history of  diverticulosis and with diverticulitis in the past.  Apparently he had been  having some fever, chills and night sweats also.   On his initial laboratory workup his white count was at 13,000, H&H of 15  and 45, platelet count of 200,000.  He had 88% neutrophils, 7% lymphocytes.  His comprehensive metabolic panel was remarkable for total bili of 2.  Otherwise, it was essentially normal.  He had a CT scan done of the abdomen  and pelvis which showed the focal area of diverticulitis at the junction of  the descending and sigmoid colon with contained perforation.  There was no  evidence for focal or rim enhancing fluid collection at this time to suggest  organized abscess; 2 cm focused fluid adjacent to the inflammatory change  was noted.  A follow-up evaluation was recommended.  The other findings are  as listed under the discharge diagnoses.   General surgery was consulted and Dr. Janee Morn saw the patient and felt that  this patient did not have an acute abdomen and recommended IV antibiotics,  as we had already  planned on.  The patient was started on Unasyn.  He  clinically continued to improve with each day.  However, it was noted that  his platelet count was trending down with the lowest being at 124,000 and  therefore the concern of mild DIC was entertained, which with a PTT at 45,  although the PTT time was normal.  With this I had recommended to the  patient that he stay on IV antibiotics.  The plan that was discussed with  him on the 22nd of April was if his platelet count was showing some  improvement and his white count stayed normal, I would switch him to oral  antibiotics on the 23rd, watch him and plan on sending him home on the 24th,  which is Sunday.  His white count has normalized, so too the left shift that  was noted on him, and on the day of discharge his white count is at 7.3  thousand, H&H of 13.4 and 39, platelet count had come back up to 144,000.  His differential was normal with 70% neutrophils and lymphocytes of 18.   The patient, however, at this time tells me that the surgeon who was  covering for Dr. Janee Morn had said that, and this is per patient, he did not  see any reason for him to stay on and that it was okay for him to be  discharged.  I tried to explain to the patient once again that he had not  been switched from IV antibiotics to oral antibiotics and for [place] to check  his white count tomorrow after making this change.  The patient insisted on  wanting to be discharged.  Therefore I felt that he needed to sign against  medical advice to leaving.  I did give him the prescription for the  Augmentin 875 mg p.o. b.i.d. that he is to take for another 14 days.  I have  told him to continue to hold on his Remicade and his methotrexate, which we  had not given to him  while he was here, and to follow-up with his  physicians, including Dr. Janee Morn, who is the surgeon who had seen him  here.  The patient was started on clear liquids last night, which he tolerated, and into  this morning the plan was to try him on full liquids  with the next meal and advance him to regular.  However, he will do this at  home as I have advised him to.  He did have two sets of blood cultures done  on the 20th of April and so far this is no growth to date.   In regards to his history of rheumatoid arthritis, since he was never  clinically appearing septic and all of his vital signs were stable, the  admitting doctor and I felt that we did not need to give him stress-dose  steroids and he did very well with this.  The plan was if he needed surgery,  he would have been placed on IV stress-dose steroids.  The patient will  continue his prednisone as he has been taking.   For his history of gastroesophageal reflux disease, we have placed him on IV  Protonix.  He will resume back his home Prilosec.   In regards to his mild hyponatremia and hypokalemia, this has resolved,  status post replacement and with increased oral intake.  He did tolerate  clear liquids, both yesterday and today.                                                Isla Pence, M.D.    RRV/MEDQ  D:  04/18/2004  T:  04/20/2004  Job:  [id number]   cc:   Gabrielle Dare. Janee Morn, M.D.  [facility]  [address] [place], [place] [address]  Fax: [phone]   Harl Bowie, M.D.  [address]  [place]  [place] [address]  Fax: [phone]   [facility]  [address]  [place]  [place] [address]  Fax: [phone]   Rheumatology Section  [facility]

## 2011-05-14 NOTE — Discharge Summary (Signed)
[name], Johnson            ACCOUNT NO.:  [id number]   MEDICAL RECORD NO.:  [id number]          PATIENT TYPE:  IPS   LOCATION:  4033                         FACILITY:  [facility]   PHYSICIAN:  Erick Colace, M.D.DATE OF BIRTH:  Dec 18, 1946   DATE OF ADMISSION:  10/27/2005  DATE OF DISCHARGE:  11/05/2005                                 DISCHARGE SUMMARY   DISCHARGE DIAGNOSES:  1.  Proximal left femur fracture, status post open reduction and internal      fixation October 23, 2005.  2.  Pain management.  3.  Coumadin for deep venous thrombosis prophylaxis.  4.  Right calcaneal plantar spur fracture.  5.  Rheumatoid arthritis.  6.  Hyperlipidemia.  7.  Gastroesophageal reflux disease.  8.  History of right lung nodule.   This is a 65 year old male with history of rheumatoid arthritis, steroid  dependent, who was admitted October 23, 2005, after a fall without loss of  consciousness.  Cranial CT scan was negative.  Sustained a proximal left  femur fracture.  Underwent open reduction and internal fixation October 23, 2005, per Loreta Ave, M.D.  Placed on Coumadin for deep venous  thrombosis prophylaxis, nonweightbearing left lower extremity.  Postoperative anemia, 8.0, transfused two units of packed red blood cells  October 26, 2005, with follow-up hemoglobin 10.2.  He was admitted for  comprehensive rehab program.   PAST MEDICAL HISTORY:  See discharge diagnoses.  Occasional alcohol, noted  history of tobacco use.   SOCIAL HISTORY:  Lives alone.  Works as at a [employer] as a  Merchandiser, retail.  No local family.  Two level home.  Bedroom downstairs.  Three  steps to entry.   ALLERGIES:  NO KNOWN DRUG ALLERGIES.   MEDICATIONS PRIOR TO ADMISSION:  1.  Prevacid 30 mg daily.  2.  Lipitor 30 mg daily.  3.  Prednisone 5 mg daily.  4.  Methotrexate 12 mg weekly.  5.  Humira 40 mg subcutaneously every two weeks.   HOSPITAL COURSE:  The patient with  progressive gains while in rehab services  with therapies initiated on a b.i.d. basis.  The following issues are  followed during the patient's rehab course.  Pertaining to [name]'s  proximal left femur fracture, surgical site healing nicely.  He was  nonweightbearing.  Neurovascular sensation intact.  He would follow up with  orthopedic services, Dr. Mckinley Jewel.  Pain management.  He continued on  the use of oxycodone as needed for pain, Robaxin for muscle spasms,  OxyContin sustained release was added to his regimen and titrated to 20 mg  every 12 hours with good results.  He remained on Coumadin for deep venous  thrombosis prophylaxis with latest INR of 1.7.  He remained on subcutaneous  Lovenox until INR greater than 2.  He would complete Coumadin protocol per  home health nursing.  He had a documented history of rheumatoid arthritis.  He remained on his prednisone as advised per his rheumatologist at [facility] as well as his weekly methotrexate.  Zocor for hyperlipidemia  which was without issue.  He had no bowel or bladder disturbances.  During  his rehab course, he noted some increased discomfort in  his right heel.  Follow-up three view x-ray showed a right calcaneal plantar spur fracture.  He was fitted with a shoe support, right heel cushion pressure relief to  accommodate the calcaneal spur fracture, again with good results. He had a  history of a right lung nodule which was again without issue, but followed  up annually at [facility].  Overall, for his functional status, he  was supervision ambulating with a rolling walker, simple set up for  activities of daily living, ongoing therapies would be dictated per rehab  services.   Latest labs showed an INR of 1.7. Hemoglobin 9.8, hematocrit 28.3.  Sodium  140, potassium 3.5, BUN 14, creatinine 1.0.   DISCHARGE MEDICATIONS:  At time of dictation:  1.  Coumadin with latest dose of 6 mg daily to be  completed on November 23, 2005.  2.  Prednisone 5 mg p.o. daily.  3.  Zocor 20 mg p.o. daily.  4.  FiberCon 625 mg p.o. daily.  5.  OxyContin sustained release 20 mg q.12h. x1 week then 10 mg q.12h. x1      week and discontinue.  6.  Methotrexate 12.5 mg p.o. every Monday.  7.  Humira 40 mg subcutaneously every 14 days.  8.  Protonix 40 mg twice daily.  9.  Oxycodone immediate release 5 mg one or two tablets q.4h. p.r.n. pain.  10. Robaxin 500 mg q.6h. p.r.n.   DIET:  Regular.   ACTIVITY:  Nonweightbearing to left lower extremity.   SPECIAL INSTRUCTIONS:  Continue Coumadin therapy per home health nursing  until protocol completed.  Patient should follow up with Dr. Mckinley Jewel,  orthopedic services.      Mariam Dollar, P.A.      Erick Colace, M.D.  Electronically Signed    DA/MEDQ  D:  11/04/2005  T:  11/04/2005  Job:  [id number]   cc:   Loreta Ave, M.D.  Fax: [phone]   Malkiat Dhatt, M.D.  Fax: [phone]

## 2011-05-14 NOTE — Op Note (Signed)
NAME:  [name], [name]                      ACCOUNT NO.:  [id number]   MEDICAL RECORD NO.:  [id number]                   PATIENT TYPE:  INP   LOCATION:  2550                                 FACILITY:  [facility]   PHYSICIAN:  Gabrielle Dare. Janee Morn, M.D.             DATE OF BIRTH:  [birth year]   DATE OF PROCEDURE:  06/22/2004  DATE OF DISCHARGE:                                 OPERATIVE REPORT   PREOPERATIVE DIAGNOSIS:  Sigmoid diverticulitis.   POSTOPERATIVE DIAGNOSIS:  Sigmoid diverticulitis.   OPERATION/PROCEDURE:  Sigmoid colectomy.   SURGEON:  Gabrielle Dare. Janee Morn, M.D.   ASSISTANT:  Rose Phi. Maple Hudson, M.D.   ANESTHESIA:  General.   HISTORY OF PRESENT ILLNESS:  The patient is a 65 year old white male who was  admitted to the hospital with an episode of sigmoid diverticulitis in April  of this year.  He was treated with antibiotics.  He did have a small abscess  collection there but improved greatly on antibiotics and did have one  recurrent flare-up after being discharged home.  He did well with oral  antibiotics.  Now he presents for elective sigmoid resection.   DESCRIPTION OF PROCEDURE:  The patient underwent bowel prep and tolerated it  well.  Informed consent was obtained.  He was brought to the operating room  and general anesthesia was administered.  His abdomen was prepped and draped  in the sterile fashion.  A lower midline incision was made and subcutaneous  tissues were dissected down to the anterior fascia which was divided.  The  peritoneal cavity was then entered under direct vision without difficulty.  Exploration revealed the proximal portion of the sigmoid colon to be stuck  over to the sidewall.  There was about a 2 cm collection of some old-looking  purulent material there that was entered.  This was sent for cultures.  The  left colon was freed up from its lateral attachments with care to protect  the ureter and the involved section of the sigmoid was taken off of  the  sidewall there and sigmoid colon was further mobilized from the peritoneum  attachments extending downward.  We planned our resection to include the  area involved with the diverticulosis.  Several diverticula were present,  but they stopped in the more distal sigmoid, well up from the rectum.  Once  the sigmoid was mobilized well, the bowel clamps were placed proximally and  distally.  The colon was  then divided at the proximal area.  The mesentery  was sequentially taken down with Kelly clamps and divided.  The mesentery  was then securely ligated with 2-0 silk sutures to get good hemostasis.  This continued until we reached the distal clamps and passed the area  involved with diverticulosis.  The bowel was again divided.  Specimen was  sent to pathology.  The other mesenteric clamps were securely ligated with  good hemostasis.  We then rejoined the bowel  together with a hand-sewn end-  to-end anastomosis.  This was accomplished with a intermittent 2-0 silk  sutures in a mattress and interrupted fashion.  A nice lumen of the  anastomosis was created and after we completed this hand-sewn anastomosis,  we did inspect the posterior part and one additional stitch was placed.  The  lumen of the anastomosis remained pink and we then changed our gloves.   The abdomen was copiously irrigated.  The anastomosis was reinspected and  appeared nicely intact.  The peritoneal wall was hemostatic.  Sponge counts  were all correct.  After copiously irrigating the abdomen, we closed the  fascia with two lengths of running #1 PDS coming from each end of the wound.  Subcutaneous tissues were irrigated.  Hemostasis was assured and the skin was closed with staples.  Sterile  dressing was applied.  Sponge, needle and instrument counts were correct.  The patient tolerated the procedure well without apparent complications and  was taken to the recovery room in stable condition.                                                Gabrielle Dare Janee Morn, M.D.    BET/MEDQ  D:  06/22/2004  T:  06/22/2004  Job:  [id number]

## 2011-05-14 NOTE — Discharge Summary (Signed)
[name], [name]            ACCOUNT NO.:  [id number]   MEDICAL RECORD NO.:  [id number]          PATIENT TYPE:  INP   LOCATION:  5030                         FACILITY:  [facility]   PHYSICIAN:  [name], M.D. DATE OF BIRTH:  [birth year]   DATE OF ADMISSION:  10/23/2005  DATE OF DISCHARGE:  10/27/2005                                 DISCHARGE SUMMARY   FINAL DIAGNOSES:  1.  Status post open reduction internal fixation left hip.  2.  History of gastroesophageal reflux disease.  3.  Rheumatoid arthritis.  4.  Hyperlipidemia.   HISTORY OF PRESENT ILLNESS:  A 65 year old white male who presented to the  [facility] ED after falling from a 15-foot ladder at his home. The incident  happened the evening of October 22, 2005, but he was not found until the  next morning by a neighbor. X-rays in the ED showed a proximal left femur  fracture.   ADMISSION LABORATORY:  Sodium 136, potassium 4.2, chloride 102, CO2 24, BUN  14, creatinine 0.9, glucose 107. WBC 19, hematocrit 38.8, hemoglobin 13.3,  platelets 187. AST 50, ALT 27, alk phos 62. PTT 31, PT 13.7, INR 1.0.   HOSPITAL COURSE:  On October 23, 2005, the patient was taken to the [facility] operating room and an open reduction internal fixation left hip  procedure performed. Surgeon [name], M.D. and assistant Genene Churn.  Barry Dienes, P.A.-C. Anesthesia general. EBL 400 mL. There were no surgical or  anesthesia complications and the patient was transferred to recovery in  stable condition. October 24, 2005, pharmacy protocol Coumadin started.  Hemoglobin 9.0, INR 1.2. Vital signs stable, afebrile. October 25, 2005,  left hip pain improved with complaints of some fatigue. No complaints of  chest pain or shortness of breath. Vital signs stable, afebrile. Hemoglobin  8.3, sodium 132, potassium 3.7, chloride 100, CO2 28, BUN 9, creatinine 1.0,  and glucose 137, INR 2.6. Wound looked good, staples intact. No signs of  infection.  Neurovascularly intact distally. Calf nontender. Leg lengths  looked good. Discontinued morphine PCA. Monitor hemoglobin. Admission CT  abdomen showed a left lung nodule and CT chest was recommended. The patient  elected not to have this and stated that this nodule is being followed by  Dr. Katrine Coho, pulmonologist at [facility]. I did speak with Dr. Katrine Coho in regards to  this and he did not feel CT chest was indicated at this time, and he would  repeat this in 6 months through his office. Discontinued Foley and hep-  locked IV. October 26, 2005, good pain control. Continues to complain of  some fatigue and lightheadedness. No chest pain or shortness of breath.  Temperature 101.3, pulse 96, respirations 18, blood pressure 122/67.  Hemoglobin 8.0, hematocrit 23.4. INR 2.6. UA showed positive blood,  leukocytes, protein, and mucus. Started Cipro 250 mg p.o. b.i.d. x5 days.  The wound looked good, staples intact. Slight serous drainage on dressing,  no signs of infection. Calf not tender and neurovascularly intact.  Transfused 2 units of packed red blood cells due to postoperative anemia.  October 27, 2005, the  patient feeling much better after transfusion. Good  pain control. Vital signs stable, afebrile. INR 1.8. H&H pending. Wound  looks good, staples intact, no signs of infection. Slight serous drainage on  dressing. Calf nontender and neurovascularly intact. The patient improved  and ready for transfer to rehab.   CURRENT MEDICATIONS:  Continue all current medications.   DISPOSITION:  Transfer to inpatient rehab.   CONDITION:  Good and stable.   INSTRUCTIONS:  The patient will continue to work with PT and OT to improve  ambulation and strengthening. He will be nonweightbearing left lower  extremity x6-8 weeks. Dressing changes p.r.n. Hip staples to be removed 2  weeks postoperatively. Will be on Coumadin x4 weeks postoperatively for DVT  prophylaxis. Follow up in our office 1 week after his  discharge.      Genene Churn. Denton Meek.      [name], M.D.  Electronically Signed    JMO/MEDQ  D:  12/13/2005  T:  12/13/2005  Job:  [id number]

## 2011-05-14 NOTE — Op Note (Signed)
[name], [name]            ACCOUNT NO.:  [id number]   MEDICAL RECORD NO.:  [id number]          PATIENT TYPE:  INP   LOCATION:  5030                         FACILITY:  [facility]   PHYSICIAN:  Loreta Ave, M.D. DATE OF BIRTH:  November 12, 1946   DATE OF PROCEDURE:  10/23/2005  DATE OF DISCHARGE:                                 OPERATIVE REPORT   PREOPERATIVE DIAGNOSIS:  Comminuted displaced subtrochanteric fracture left  femur.   POSTOPERATIVE DIAGNOSIS:  Comminuted displaced subtrochanteric fracture left  femur.   PROCEDURE:  Open reduction and internal fixation subtrochanteric fracture  left femur utilizing a Stryker Gamma nail, 11 mm x 400 mm, with a 130-degree  neck angle, 95-degree lag screw, end cap and set screw, two distal locking  screws 40 mm and 45 mm, also cerclage of fracture with one Dall-Miles cable.   SURGEON:  Loreta Ave, M.D.   ASSISTANT:  Genene Churn. Denton Meek.   ANESTHESIA:  General.   BLOOD LOSS:  200 cc.   BLOOD GIVEN:  None.   SPECIMENS:  None.   CULTURES:  None.   COMPLICATIONS:  None.   DRESSINGS:  Soft compressive.   PROCEDURE:  The patient was brought to the operating room, and after  adequate anesthesia had been obtained he was placed on the [facility] fracture  table.  Appropriate padding and support.  The right hip flexed.  Longitudinal traction to realign the fracture on the left.  Fluoroscopy used  throughout.  Able to get him out to a reasonable length.  He was then  prepped and draped in the usual sterile fashion.  Incision made at the top  of the trochanter.  Skin and subcutaneous tissue and fascia incised.  Blunt  dissection used to expose the tip of the trochanter. This was entered with  an awl slightly anterior to the midline, right at the tip of the trochanter.  Guide wire was then placed through the opening, across the fracture site and  then down to the distal femur.  The proximal fragment tended to stay very  abducted, and  this was very difficult to hold aligned.  I was able to get  the guide wire down and ream up to 13 mm distally and 15 mm proximally for  the Gamma nail.  I could not however hold the fracture in good apposition by  closed means.  I made a lateral incision at the same level where I was going  to put in the lag screw.  Skin and subcutaneous tissue and fascia incised.  Blunt dissection used to expose the fracture, which was then clamped with a  [place] claw to reduce this anatomically.  It was then hold in place with a  cerclage Dall-Miles cable which was carefully brought around the femur and  then tensioned to hold the fracture reduced.  With that in place, the nail  was then driven from proximal to distal across the fracture.  Appropriate  rotation for the lag screw, and driven down so the lag screw would be at the  inferior half of the neck and ball of the femur.  Good alignment capturing  of the fracture above and below.  With fluoroscopic guidance and appropriate  guides, a K-wire was driven up centrally in the head on the lateral view and  slightly inferior on the AP view.  Measured for a 95 mm lag screw,  predrilled, and then lag screw placed through the lateral cortex and through  the rod.  Good positioning confirmed.  Fixed in place in the rod with the  set screw and then an end cap screw applied.  The fracture was reduced  anatomically, with good capturing proximally.  I then used fluoroscopy to  lock it distally because of the unstable nature of the fracture.  Small  lateral incision.  With fluoroscopic guidance, two drill holes were then  made across the cortex and through the distal interlocking holes of the rod.  A 40 mm screw in the proximal and distal hole and a 45 mm screw in the more  distal of the two distal holes.  These were countersunk, well capturing the  rod and bone.  Confirmed fluoroscopically on AP and lateral view.  Overall  construct examined.  Anatomic alignment in  good fixation throughout.  All  clamps had been removed.  Wounds were irrigated.  Fascia closed with Vicryl.  Skin closed with Vicryl and then staples.  Wounds were injected with  Marcaine.  All traction had been removed before the rod was interlocked.  Once the wounds were closed and dressing in place, he was taken off the  fracture table.  Anesthesia reversed.  Brought to the recovery room.  Tolerated the surgery well.  No complications.      Loreta Ave, M.D.  Electronically Signed     DFM/MEDQ  D:  10/25/2005  T:  10/25/2005  Job:  [id number]

## 2011-05-14 NOTE — H&P (Signed)
NAMEINRI, SOBIESKI            ACCOUNT NO.:  [id number]   MEDICAL RECORD NO.:  [id number]          PATIENT TYPE:  IPS   LOCATION:  4033                         FACILITY:  [facility]   PHYSICIAN:  Ranelle Oyster, M.D.DATE OF BIRTH:  22-Jun-1946   DATE OF ADMISSION:  10/27/2005  DATE OF DISCHARGE:                                HISTORY & PHYSICAL   CHIEF COMPLAINT:  Left hip pain.   HISTORY OF PRESENT ILLNESS:  This 65 year old white male with history of  rheumatoid arthritis is admitted on October 23, 2005, after a fall. The  patient sustained a proximal left femur fracture and underwent an ORIF on  October 28 by Dr. Eulah Pont. He was placed on Coumadin for DVT prophylaxis. The  patient developed postoperative anemia of 8.0 and was transfused 2 units of  packed red blood cells yesterday. The patient still is needing assistance  with self care mobility. He will be alone at discharge.   REVIEW OF SYSTEMS:  The patient reports joint swelling, reflux,  constipation, pain at a 6/10 in the left leg. Full review of systems is in  the written H&P.   PAST MEDICAL HISTORY:  Positive for:  1.  Gastroesophageal reflux disease.  2.  Rheumatoid arthritis.  3.  Hyperlipidemia.  4.  Diverticulitis with history of colon resection.  5.  Left lung pulmonary nodule being followed at [facility].  6.  History of tobacco use.   FAMILY HISTORY:  Positive for coronary artery disease and hypertension.   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 He has no local family. He has a two-level home with a bathroom  downstairs and three steps to enter. The patient was independent prior to  arrival.   MEDICATIONS PRIOR TO ARRIVAL:  Include Prevacid, Lipitor, prednisone,  methotrexate, Humira, FiberCon.   ALLERGIES:  None.   LABORATORY TODAY:  Include hemoglobin 10.2, hematocrit 29.3. Sodium 137,  potassium 3.6, BUN and creatinine 6 and 0.9. Urine culture is negative. INR  1.8.   PHYSICAL EXAMINATION:  VITAL SIGNS:  Blood pressure is 134/78, pulse 79,  respiratory rate 18, temperature 98.7.  GENERAL:  The patient is pleasant, in no acute distress. He is slightly  overweight.  HEENT:  Pupils are equal, round, and reactive to light and accommodation.  Extraocular eye movements are intact. Ear, nose, and throat exam is  unremarkable. Oral mucosa is pink and moist.  NECK:  Supple without JVD or lymphadenopathy.  CHEST:  Clear to auscultation bilaterally without wheezes, rales, or  rhonchi.  HEART:  Regular rate and rhythm without murmur, rubs, or gallops.  ABDOMEN:  Soft, nontender, bowel sounds are  positive.  EXTREMITIES:  Show no clubbing, cyanosis, or edema. The patient has  rheumatoid deformities of both hands. He has some flattening and loss of  form in both feet. Left hip was intact with serosanguineous drainage of a  moderate amount. The area is appropriately tender.  NEUROLOGIC:  Motor function was 4 to 4+ out of 5 proximally in the upper  extremities. Hands were 2+ to 3+ out of 5. Lower  extremity strength is 4 out  of 5 on the right and 2+ to 3 out of 5 proximally on the left, and 4+ out of  5 on the left distally. Sensory exam is grossly intact. Judgment,  orientation, memory were normal. The patient is slightly antagonistic on  exam. Cranial nerves are intact. Deep tendon reflexes are 1+.   ASSESSMENT AND PLAN:  1.  Proximal left femur fracture status post fall and status post open      reduction internal fixation. Begin comprehensive inpatient rehab with      modified independent goals. Estimated length of stay 7+ days. Prognosis      is fair to good.  2.  Pain management with OxyContin 20 mg q.12h. and Percocet 5 one to two      q.6h. p.r.n. The patient may use Robaxin p.r.n. for spasm as well.  3.  Deep venous thrombosis prophylaxis with Lovenox to Coumadin.  4.  Rheumatoid arthritis. Continue prednisone, methotrexate, and Humira.  5.   Hyperlipidemia:  Zocor.  6.  Gastroesophageal reflux disease:  Protonix.  7.  Constipation. Begin Senokot and sorbitol.      Ranelle Oyster, M.D.  Electronically Signed     ZTS/MEDQ  D:  10/27/2005  T:  10/27/2005  Job:  [id number]

## 2011-05-14 NOTE — Op Note (Signed)
NAME:  [name], [name]            ACCOUNT NO.:  [id number]   MEDICAL RECORD NO.:  [id number]          PATIENT TYPE:  AMB   LOCATION:  ENDO                         FACILITY:  [facility]   PHYSICIAN:  Petra Kuba, M.D.    DATE OF BIRTH:  October 01, 1946   DATE OF PROCEDURE:  03/10/2006  DATE OF DISCHARGE:                                 OPERATIVE REPORT   PROCEDURE:  Endoscopic retrograde cholangiopancreatography, stent removal,  balloon pull through.   INDICATIONS FOR PROCEDURE:  Patient with fevers, stent in place, occasional  elevated liver tests, wanted to remove stent, remove any residual debris or  stones from the duct, see if he feels any better.  Consent was signed after  risks, benefits, methods, and options were thoroughly discussed in the  office.   MEDICATIONS USED:  Demerol 100 mg, Versed 9.   PROCEDURE:  The side viewing therapeutic video duodenoscope was inserted by  indirect vision into the stomach and advanced through a widely patent  pylorus into the duodenum.  The stent was seen in the proper position, the  flange seemed to be slightly inside the ampulla.  Based on slightly abnormal  anatomy, we could not get short position throughout the procedure.  With  long position, we grasped the stent with the rat-tooth, pulled the flange  out of the ampulla, and then grasped the stent with the snare, and the snare  and the stent were removed.  We then went ahead and easily cannulated using  the triple lumen sphinctertome.  No PD injections were done throughout the  procedure.  We were able to put the JAG wire into the intrahepatic.  There  may have been a little debris versus air in the CBD, but no frank obvious  stones.  We went ahead and did two balloon pull through's using the [facility]  Scientific adjustable balloon at the 9 mm mark, two pull through's went  smoothly without much debris, maybe a little bit of inflammatory substance,  and no resistance on passing through the  ampulla.  He did seem to drain  readily from a very small sphincterotomy in the past.  He had a very small  ampulla and we deemed it not wise at this juncture, due to no obvious stones  and seemingly draining well and a very small ampulla, plus with our atypical  position, we thought it was best not to increase his sphincterotomy size,  and after our two balloon pull through's, elected to stop the procedure at  this junction.  The scope was removed.  The patient tolerated the procedure  well.  There was no obvious immediate complications.   ENDOSCOPIC DIAGNOSIS:  1.  Difficult long position.  2.  Stent slightly up the duct, grasped with the rat-tooth, snared, and      removed.  3.  Adequate drainage.  4.  Small ampulla.  5.  No PD injection.  6.  No obvious stones, although on initial injection, slight possible debris      sludge versus air.  7.  Status post two 9 mm balloon pull through's without resistance or  significant abnormality.  8.  Adequate drainage at the end of the procedure.   PLAN:  Observe for delayed complications, if none, further workup and plans  pending symptoms.  I do not know who is primary doctor is or rheumatologist,  but possibly they would be better served seeing him back if he continues to  have issues.  Mostly, he is complaining of low grade fever, fatigue, without  specific symptoms, although he did have some mild abdominal pain that,  hopefully, removing the stent will help.  I will be happy to see back p.r.n.           ______________________________  Petra Kuba, M.D.     MEM/MEDQ  D:  03/10/2006  T:  03/11/2006  Job:  [id number]   cc:   Gabrielle Dare. Janee Morn, M.D.  [facility]  [address] [place], [place] [address]   Loreta Ave, M.D.  Fax: [phone]

## 2011-05-14 NOTE — H&P (Signed)
NAME:  [name], [name]                      ACCOUNT NO.:  [id number]   MEDICAL RECORD NO.:  [id number]                   PATIENT TYPE:  EMS   LOCATION:  MAJO                                 FACILITY:  [facility]   PHYSICIAN:  Corinna L. Lendell Caprice, MD             DATE OF BIRTH:  Apr 16, 1946   DATE OF ADMISSION:  04/14/2004  DATE OF DISCHARGE:                                HISTORY & PHYSICAL   CHIEF COMPLAINT:  Belly pain.   HISTORY OF PRESENT ILLNESS:  [name] is a 65 year old unassigned  patient who presents to the emergency room with an approximately 24-hour  history of left lower quadrant pain; he also had some loose stool.  He has  not had any vomiting.  He has had no melena.  He has a history of  diverticulitis per his report on the right side.  He has been having fevers,  chills and night sweats.   PAST MEDICAL HISTORY:  1. Rheumatoid arthritis.  2. Gastroesophageal reflux disease.  3. Hyperlipidemia.  4. History of colon polyps.   MEDICATIONS:  1. Prednisone 5 mg p.o. daily.  2. Prilosec.  3. Remicade q.8 wk.  4. Methotrexate weekly.  5. Folic acid daily.   SOCIAL HISTORY:  The patient is not married.  He smokes a pack of cigarettes  a day.  He drinks moderately several times a week.   FAMILY HISTORY:  His mother died of a heart attack in her 71s.  His father  died of an intracranial hemorrhage in his 14s.   REVIEW OF SYSTEMS:  Review of systems as above, otherwise, negative.   PHYSICAL EXAMINATION:  VITAL SIGNS:  On physical examination, temperature  currently is 102.9, blood pressure 110/60, pulse 95, respiratory rate 24,  oxygen saturation 96% on room air.  GENERAL:  In general, the patient is in no acute distress.  HEENT:  Normocephalic, atraumatic.  Pupils equal, round and reactive to  light.  Sclerae nonicteric.  Conjunctivae pink.  Moist mucous membranes.  Oropharynx is without erythema or exudate.  NECK:  Neck is supple.  No lymphadenopathy.  LUNGS:   Lungs are clear to auscultation bilaterally without wheezes, rhonchi  or rales.  CARDIOVASCULAR:  Regular rate and rhythm without murmurs, gallops or rubs.  ABDOMEN:  Normal bowel sounds.  Soft.  He has left lower quadrant tenderness  with some local rebound tenderness.  GU AND RECTAL:  Deferred.  EXTREMITIES:  No clubbing, cyanosis, or edema.  SKIN:  Skin is hot to touch and flushed.  No rash.  NEUROLOGIC:  The patient is alert and oriented.  Cranial nerves and  sensorimotor exam are intact.   LABORATORIES:  White blood cell count is 13,000, hemoglobin 15, hematocrit  45, platelet count 200,000; 88% neutrophils, 7% lymphocytes.  Complete  metabolic panel is remarkable for total bilirubin of 2.0, otherwise,  essentially normal.  UA reveals 15 ketones, 30 proteins, negative nitrites,  trace leukocyte esterase,  0-2 white cells, a few bacteria.   CT -- wet reading -- of the abdomen and pelvis shows sigmoid diverticulitis  with small loculi of free air and a focal 2.5-cm fluid collection that does  not show substantial rim enhancement but may be a developing abscess.  There  is also a left lower lobe nodule and left renal stone.   ASSESSMENT AND PLAN:  1. Sigmoid diverticulitis with early abscess.  The emergency room physician     has discussed the case with Dr. Gabrielle Dare. Janee Morn, who is on call for     general surgery, and has referred patient to internal medicine to admit.     I will admit the patient to the floor.  He has been given a dose of     Unasyn, which I will continue.  He will be n.p.o. except for ice chips.     I have consulted Dr. Janee Morn to follow along in case the patient should     worsen and need surgical intervention.  He will get serial abdominal     exams and most likely serial CAT scans.  If he does well overnight, we     can probably advance his diet slowly.  2. Rheumatoid arthritis.  The patient will get prednisone at his outpatient     dose, but if he becomes  unstable at all, he would require stress-dose     steroids.  Of course, his methotrexate and folate will be held.  3. Gastroesophageal reflux disease.  The patient will get intravenous     Protonix.  4. Left lower lobe nodule.  This can be worked up as an outpatient.  5. Hyperlipidemia.  The patient recently had his Lipitor discontinued     because he was apparently having some musculoskeletal complaints with it.                                                Corinna L. Lendell Caprice, MD    CLS/MEDQ  D:  04/14/2004  T:  04/15/2004  Job:  [id number]

## 2011-05-14 NOTE — Op Note (Signed)
NAMEETHANIEL, GARFIELD            ACCOUNT NO.:  [id number]   MEDICAL RECORD NO.:  [id number]          PATIENT TYPE:  OIB   LOCATION:  5012                         FACILITY:  [facility]   PHYSICIAN:  Gabrielle Dare. Janee Morn, M.D.DATE OF BIRTH:  1946-10-09   DATE OF PROCEDURE:  02/07/2006  DATE OF DISCHARGE:                                 OPERATIVE REPORT   PREOPERATIVE DIAGNOSIS:  History of choledocholithiasis.   POSTOP DIAGNOSIS:  History of choledocholithiasis.   PROCEDURES:  Laparoscopic cholecystectomy with intraoperative cholangiogram.   SURGEON:  Violeta Gelinas.   ASSISTANT:  Velora Heckler, MD   HISTORY OF PRESENT ILLNESS:  The patient is a 65 year old male who is well-  known to me from previous surgery who was admitted recently with a common  bile duct stone and septic shock. He was treated with ERCP and stent  placement and required several days on the ventilator with severe sepsis.  He is completely recovered from that and now presents for elective  cholecystectomy to prevent recurrence of this troublesome issue.   PROCEDURE IN DETAIL:  Informed consent was obtained. The patient was  identified in preoperative holding area. He received intravenous  antibiotics. He is brought to the operating room and general anesthesia was  administered.  His abdomen was prepped and draped in sterile fashion. The  supraumbilical incision was made due to his previous scar after injecting  0.25% Marcaine with epinephrine for local anesthetic. Subcutaneous tissues  were dissected down to the anterior fascia. This was divided sharply. The  peritoneal cavity was entered under direct vision without difficulty. A 0  Vicryl pursestring suture was placed around the fascial opening.  The Hasson  trocar was inserted into the abdomen and the abdomen was insufflated with  carbon dioxide in standard fashion. Under direct vision a 11 mm epigastric  and two 5-mm lateral ports were placed.  Local anesthetic  was injected at  all port sites. Exploration revealed a somewhat intrahepatic gallbladder,  the dome was retracted superior medially. Some filmy adhesions were swept  down from the infundibulum. The infundibulum was retracted inferolaterally.  Dissection was somewhat hindered by a large floppy lobe of left lobe of the  liver.  It was necessary to place a 5 mm port midway up along the midline in  order to put a blunt retractor and to hold this lobe out of the way.  Local  anesthetic was again used at port site. Dissection was begun laterally and  progressed medially easily identifying the infundibulocystic duct junction.  There was a considerable length of cystic duct and cystic duct was well  dissected until a large window was created between the cystic duct, the  liver and the infundibulum of the gallbladder. Once this was accomplished, a  clip was placed on the infundibulocystic duct junction. A small nick was  made in the cystic duct. A Reddick cholangiogram catheter was inserted.  Intraoperative cholangiogram was obtained demonstrating the stent present in  the common bile duct. No filling defects were seen. There was good flow of  contrast into the duodenum. The cholangiogram catheter was removed and the  three clips were placed proximally on the cystic duct and it was divided.  Further dissection revealed a cystic artery. This was dissected well.  Two  clips placed proximally, one distally and it was divided. The gallbladder  was gradually taken off the liver bed with Bovie cautery. We did encounter a  posterior branch of cystic artery. This was dissected then clipped twice  proximally and divided distally with cautery. The gallbladder was removed  from the liver bed with Bovie cautery. Excellent hemostasis was obtained.  The gallbladder was placed and the EndoCatch bag and taken out the abdomen  via the supraumbilical port site. The liver bed was copiously irrigated.  Cautery was  used excellent hemostasis and the irrigation fluid was evacuated  and it returned clear. The liver bed was rechecked. Excellent hemostasis was  present. The remainder of the irrigation fluid was evacuated and the ports  removed under direct vision. The pneumoperitoneum was released. The abdomen  was inspected and he had a few adhesions to the anterior abdominal wall  inferiorly.  There was no evidence of any injury to abdominal contents on  insertion of ports. The remainder of ports removed. The pneumoperitoneum was  released. The Hasson trocar was removed. The supraumbilical fascia was  closed by tying the 0 Vicryl pursestring suture. All five wounds were  copiously irrigated. Some additional local anesthetic was injected and the  skin of each was closed with running 4-0 Vicryl subcuticular stitch. Sponge,  needle and instrument counts were correct. Benzoin, Steri-Strips and sterile  dressings were applied. The patient tolerated the procedure well without  apparent complication. Taken to recovery room in stable condition.      Gabrielle Dare Janee Morn, M.D.  Electronically Signed     BET/MEDQ  D:  02/07/2006  T:  02/08/2006  Job:  [id number]   cc:   Petra Kuba, M.D.  Fax: [phone]   Loreta Ave, M.D.  Fax: [phone]   [facility]

## 2011-05-14 NOTE — Discharge Summary (Signed)
NAME:  [name], [name]                      ACCOUNT NO.:  [id number]   MEDICAL RECORD NO.:  [id number]                   PATIENT TYPE:  INP   LOCATION:  5706                                 FACILITY:  [facility]   PHYSICIAN:  Gabrielle Dare. Janee Morn, M.D.             DATE OF BIRTH:  September 15, 1946   DATE OF ADMISSION:  06/22/2004  DATE OF DISCHARGE:  06/27/2004                                 DISCHARGE SUMMARY   DISCHARGE DIAGNOSES:  1. Sigmoid diverticula.  2. Status post sigmoid colectomy.   HISTORY OF PRESENT ILLNESS:  The patient is a 65 year old white male who was  admitted to the hospital in April with acute sigmoid diverticulitis.  The  patient was treated with antibiotics and improved greatly, but presents for  elective sigmoid resection.   HOSPITAL COURSE:  The patient underwent sigmoid colectomy.  At the time of  surgery, a small fluid collection was localized and walled off next to the  sigmoid colon.  Cultures of this area were taken.  The patient had otherwise  uneventful surgery.  Postoperatively, he was given cefotetan IV.  He  remained afebrile and hemodynamically stable.  He was continued on cefotetan  until his white count was normal, and he did remain afebrile.  He had a mild  postoperative ileus that resolved, and he moved his bowels on postoperative  day #4.  He tolerated clear liquids and gradual further advancement of his  diet, and had an otherwise uncomplicated course.  He was discharged home on  postoperative day #5 in stable condition.   DISCHARGE DIET:  Avoid heavy fiber for the first week.   DISCHARGE MEDICATIONS:  The patient is to resume all of his home medications  including his prednisone, and also Percocet 5/325 one to two every six hours  as needed for pain.   DISCHARGE ACTIVITIES:  No lifting.   FOLLOWUP:  The patient is to come out for a follow up appointment,  Wednesday, July 01, 2004, for staple removal.   NOTE:  The patient's intraoperative culture  came back E coli which had some  resistance to some antibiotics, but it was indeed sensitive to the cefotetan  that the patient was treated with.                                                Gabrielle Dare Janee Morn, M.D.    BET/MEDQ  D:  06/27/2004  T:  06/28/2004  Job:  [id number]

## 2011-05-14 NOTE — Discharge Summary (Signed)
NAMEJAMIAN, ANDUJO            ACCOUNT NO.:  [id number]   MEDICAL RECORD NO.:  [id number]          PATIENT TYPE:  INP   LOCATION:  3027                         FACILITY:  [facility]   PHYSICIAN:  C. Ulyess Mort, M.D.DATE OF BIRTH:  04-29-1946   DATE OF ADMISSION:  12/13/2005  DATE OF DISCHARGE:  12/23/2005                                 DISCHARGE SUMMARY   DISCHARGE DIAGNOSES:  1.  Acute cholangitis with cholelithiasis status post endoscopic retrograde      cholangiopancreatography and stent placement.  2.  Ventilator-dependent respiratory failure secondary to septic shock      secondary to acute cholangitis.  3.  Acute renal failure secondary to shock and acute tubular necrosis.  4.  Volume excess without pulmonary edema.  5.  Septic shock.  6.  History of rheumatoid arthritis on Humira and prednisone.  7.  Gastroesophageal reflux disease.  8.  History of lung nodules.  9.  Diverticulitis.  10. Recent femur fracture undergoing rehabilitation on Coumadin at home.  11. Hyperlipidemia.  12. Left foot neuropathy.  13. History of prior sigmoidectomy.   DISCHARGE MEDICATIONS:  1.  Cipro 500 mg p.o. b.i.d. for four more days.  2.  Lopressor 12.5 mg p.o. b.i.d.  3.  Prednisone 40 mg p.o. daily x2 days and then 30 mg x2 days and then 20      mg x2 days and then 10 mg x2 days, and then 5 mg once a day.  4.  Humira 40 mg subcu every 14 days.  5.  Methotrexate restarting one week.  6.  Lipitor 10 mg p.o. daily.  7.  Prevacid one capsule p.o. daily.  8.  Vicodin p.r.n. for pain.  9.  Folate 1 mg p.o. daily.   FOLLOW UP:  1.  The patient is supposed to return to Dr. Lindie Spruce at [facility]      [facility]gery on December 28, 2005 at 9:45 a.m.  2.  He is also supposed to follow up Dr. Sherlyn Lick in one to two weeks.  The      patient will call for appointment.  3.  The patient was especially instructed to follow up with Dr. Alene Mires      for appointment sooner than scheduled if arthritis  flares up.   CONSULTANTS ON THIS ADMISSION:  1.  Dr. Arlean Hopping with [facility].  2.  Dr. Randa Evens with gastroenterology.  3.  Dr. Ewing Schlein also with gastroenterology.   PROCEDURES AT THIS ADMISSION:  1.  Continuous mechanical ventilation on the date of December 14, 2005.  2.  ERCP with stent placement on December 14, 2005.  3.  Endoscopic sphincterotomy and papillotomy on December 14, 2005.  4.  Arterial catheterization on December 14, 2005.  5.  Central venous catheter placement on December 14, 2005.  6.  Extubation on December 20, 2005.   IMAGES AT THIS ADMISSION:  1.  Chest x-ray on December 13, 2005 showed low inspiratory bronchial      inflammatory changes and wide basilar atelectasis.  2.  Abdominal film on December 14, 2005 showed questionable left mid abdomen  calcification versus artifact of bowel gas.  3.  Ultrasound of the abdomen on December 14, 2005 showed cholelithiasis      with liver wall thickening, large unsinography Murphy's sign compatible      with acute cholecystitis.  Also gallbladder thickening and focal      tenderness over the gallbladder can occur with hepatitis.  Biliary and      pancreatic ductal dilatation, question distal common bile duct      obstruction, more right renal cysts and left renal calculus.      Heterogeneously mildly enlarged liver.  4.  Renal ultrasound on December 17, 2005 showed both kidneys likely larger      than demonstrated on prior exam four days prior.  This could represent      mild renal inflammation.  No evidence of hydronephrosis, ascites, and      small left pleural effusion.  5.  Chest x-ray on December 20, 2005 showed endotracheal tube removed,      significant improvement in edema and atelectasis.   HISTORY OF PRESENT ILLNESS:  Mr. Kennard is a 65 year old white male with  history of rheumatoid arthritis, no prior renal history, who was admitted  with high fever, temperature of 105, tender right upper  quadrant, jaundice  on December 18.  Within the first 24 hours of admission he had an acute  decline with hypertension and required intubation.  He underwent ERCP with  sphincterotomy and stent placement within the first 48 hours of admission  and has been in the ICU on vasopressin and has got multiple liters of extra  fluid over several days.  With fluid resuscitation, his creatinine has been  rising.  His urine output has been poor, but blood pressure has been good.  He was weaned off pressors.  His CVPs have ranged from 18 to 20 throughout  hospitalization.  He was recently hospitalized for fracture of his femur and  was discharged on November 1.  He was going to rehab and he was on Coumadin  for DVT prophylaxis.  His bilirubin at admission was high to 6.  He had  elevated liver tests with transaminases over 200 and an ultrasound showing  extrahepatic biliary dilation and positive Murphy's sign.  Pancreatic duct  was also slightly dilated.  There was marked cholelithiasis and  pericholecystic fluid.  There was a question of obstructing lesion in the  distal common duct.   PHYSICAL EXAMINATION ON ADMISSION:  VITAL SIGNS:  Temperature of 104, blood  pressure 187/109, heart rate of 104, respiratory rate 22, O2 saturation 94%  on 2 L.  GENERAL:  The patient is in severe distress.  Cursing and moaning with each  breath.  HEENT:  Eyes slightly icteric and PERRLA.  Extraocular movements intact.  Oropharynx clear.  Mucosa dry.  Poor dentition with enamel erosion.  NECK:  Supple.  No lymphadenopathy.  RESPIRATORY:  Coarse breath sounds bilaterally.  No wheezes or rales.  CARDIOVASCULAR:  Soft, normal S1 and S2, slightly tachycardic.  GASTROINTESTINAL:  Obese, diffusely tender to palpation especially in the  right upper quadrant, but no rebound.  Mild guarding in the right upper  quadrant.  Vertical midline scar below umbilicus and decreased bowel sounds,  but positive. EXTREMITIES:  No  edema.  No calf tenderness.  RECTAL:  Normal rectal tone.  Stool around anus and buttocks, brown, no  pain.  SKIN:  Warm and dry.  MUSCULOSKELETAL:  Bilateral 5/5 in upper extremities.  Unable to cooperate  with lower extremity exam due to pain.  NEUROLOGIC:  II-XII grossly intact and nonfocal.  PSYCHIATRIC:  Marginally cooperative.  Restless secondary to pain.   LABORATORIES ON ADMISSION:  Urine:  Amber.  Specific gravity 1.022, pH 6.5,  15 ketones, glucose negative, trace leukocytes, 0-2 white blood cells, red  blood cells, lymphs positive, rare squamous cells.  Sodium 135, potassium  3.2, chloride 107, bicarb 19, BUN 17, creatinine 0.7, glucose 104.  Hemoglobin 13.6, white blood cells 8.9, and platelets 177.  ANC 8.3, MCV  92.3.  Anion gap of 7.  Bili 5.1 with a direct of 2.9, indirect of 2.2,  alkaline phosphatase of 179, SGOT 264, SGPT 330, protein 6.3, albumin 3.5,  ammonia 45.  CT of the head:  Stable, minimal diffuse cerebral and  cerebellar atrophy, improved chronic sinusitis.  Chest x-ray:  Bronchitis.   HOSPITAL COURSE:  Problem 1. Abdominal pain.  Considered secondary to  cholangitis.  The patient underwent ERCP with stent placement and  papillotomy.  He required intubation secondary to septic shock.  He was  placed on pressors and successfully weaned after several days of intensive  treatment.  He will require as an outpatient elective cholecystectomy.  Acute cholangitis was secondary to cholelithiasis and stone impaction.  Also  the patient developed pancreatitis which was also considered secondary to  cholelithiasis and stone migration.  The patient will follow up as an  outpatient with Dr. Lindie Spruce.   Problem 2. Rheumatoid arthritis.  Prednisone was started.  The patient was  placed on stress dose steroids and methotrexate will be restarted post  discharge.  The patient will follow up with Dr. Satira Sark. Sherron Monday at [facility].   Problem 3. Septic shock, resolved with antibiotics and  post ERCP.   Problem 4. Polysubstance abuse.  The patient was advised to quit drinking.   Problem 5. Acute renal failure, considered secondary to septic shock.  Creatinine improved throughout hospitalization and at discharge it was  within normal limits.   Problem 6. Thrombocytopenia, considered secondary to DIC.  PT was slightly  elevated.  PTT was slightly elevated, but fibrinogen was increased as well  as D-dimer.  There were no signs of bleeding.  At discharge, platelets are  within normal limits.   Problem 7. Anemia, considered secondary to acute illness and sepsis.  Improved at discharge.   Problem 8. Relative adrenal insufficiency.  The patient was started on  hydrocortisone and was tapered off and discharged home on prednisone 5 mg.   Problem 9. Prophylaxis.  The patient was prophylactically placed on  Protonix.   LABORATORIES AT DISCHARGE:  Sodium 141, potassium 3.4, chloride 114, bicarb 22, BUN 46, creatinine 1.9.  Platelets 164, hemoglobin 10.3.      Vanetta Mulders, MD    ______________________________  C. Ulyess Mort, M.D.    DA/MEDQ  D:  01/10/2006  T:  01/11/2006  Job:  [id number]   cc:   Cherylynn Ridges, M.D.  [address], [address]  [place]  [place] [address]   Harl Bowie, M.D.  Fax: [phone]   DR. Alene Mires  [facility]

## 2011-05-14 NOTE — Op Note (Signed)
[name], [name]            ACCOUNT NO.:  [id number]   MEDICAL RECORD NO.:  [id number]          PATIENT TYPE:  AMB   LOCATION:  ENDO                         FACILITY:  [facility]   PHYSICIAN:  Petra Kuba, M.D.    DATE OF BIRTH:  Aug 14, 1946   DATE OF PROCEDURE:  05/31/2006  DATE OF DISCHARGE:                                 OPERATIVE REPORT   PROCEDURE:  EGD with biopsy and dilatation.   INDICATIONS FOR PROCEDURE:  Dysphagia, abnormal barium swallow.   Consent was signed after risks, benefits, methods, and options were  thoroughly discussed in the office on multiple occasions.   MEDICINES USED:  Fentanyl 100 mcg, Versed 10 mg.   DESCRIPTION OF PROCEDURE:  The video endoscope was inserted by direct  vision. He did have some spasm in the mid to distal esophagus but once that  released, we were easily able to advance into the stomach. He did have a  small to medium size hiatal hernia with a widely patent fibrous ring. The  scope passed into the stomach and advanced through the antrum pertinent for  some minimal linear antritis into the duodenal bulb where a small bulb polyp  was seen and a few cold biopsies were obtained. The scope was inserted into  a normal second portion of the duodenum. The scope was withdrawn back to the  bulb and no other abnormalities were seen. The scope was withdrawn back to  the stomach and retroflexed. High in the cardia, the hiatal hernia was  confirmed. The angularis, fundus, lesser and greater curve were normal on  retroflexed visualization. Straight visualization of the stomach did not  reveal any additional findings. The scope was then slowly withdrawn back to  about 20 cm and readvanced through the essentially normal esophagus except  for the hiatal hernia and ring mentioned above. The scope was advanced to  the antrum, the customary J-loop of the scope was confirmed under fluoro.  The Savary wire was advanced and confirmed in the proper position  under  fluoro both endoscopically and under fluoro. The scope was removed making  sure to keep the wire in the proper position and once the scope was removed  we proceeded with Savary 14 and 16 mm dilators. Both were confirmed in the  proper position in the stomach under fluoro. There was no resistance or heme  in passing either dilator. Once the 16 was confirmed in the stomach, the  wire was withdrawn back into the dilator, both were removed in tandem. The  procedure was terminated at the junction. The patient tolerated the  procedure well. There was no obvious immediate complication.   ENDOSCOPIC DIAGNOSES:  1.  Small to medium size hiatal hernia with a widely patent fibrous ring.  2.  Some distal esophageal spasm.  3.  Minimal antritis.  4.  Small bulb polyp cold biopsied.  5.  Otherwise normal EGD therapy, Savary dilatation under fluoro to 16 cm      without resistance or heme.   PLAN:  See how the dilation  works. Continue Prevacid. Followup with me  p.r.n. or in two  months. Touch base in a week about the biopsies.           ______________________________  Petra Kuba, M.D.     MEM/MEDQ  D:  05/31/2006  T:  06/01/2006  Job:  [id number]

## 2011-05-14 NOTE — Consult Note (Signed)
NAME:  [name], [name]                      ACCOUNT NO.:  [id number]   MEDICAL RECORD NO.:  [id number]                   PATIENT TYPE:  INP   LOCATION:  1828                                 FACILITY:  [facility]   PHYSICIAN:  Gabrielle Dare. Janee Morn, M.D.             DATE OF BIRTH:  03-16-1946   DATE OF CONSULTATION:  DATE OF DISCHARGE:                                   CONSULTATION   REASON FOR CONSULTATION:  Diverticulitis.   HISTORY OF PRESENT ILLNESS:  I was asked by Dr. Cammie Mcgee L. Lendell Caprice to  evaluate this 65 year old white male in regards to a proximal sigmoid  diverticulitis.  The patient has a history of rheumatoid arthritis and  presents to the [facility] Emergency Department today with a two-day history  of left abdominal pain.  He has had episodes like this similarly in the past  which have always resolved spontaneously without medical treatment.  This  episode was a little bit worse, and he came for evaluation.  He had been  trying to take some laxatives to work his way through this.  He did have a  bowel movement earlier today.  He had some subjective fevers as well prior  to coming to the hospital.   Workup in the emergency department demonstrated fever.  White blood cell  count was 13,000.  CT scan of the abdomen and pelvis demonstrates  diverticulitis in the proximal sigmoid with a tiny air bubble, and a small  phlegmon of about 2.5 cm.  I was asked to evaluate and follow along as this  is treated medically.   PAST MEDICAL HISTORY:  1. Rheumatoid arthritis.  2. Hypercholesterolemia.   PAST SURGICAL HISTORY:  Right-hand surgery.   ALLERGIES:  None.   MEDICATIONS:  1. Prednisone 5 mg p.o. daily.  2. He also takes Lipitor and Prilosec.   SOCIAL HISTORY:  He smokes one pack per day.  He drinks some alcohol daily.   REVIEW OF SYSTEMS:  GENERAL:  He feels mild to moderately ill.  CARDIOVASCULAR:  Negative.  PULMONARY:  Negative.  GI:  Please refer to the  history of  present illness.  GU:  Negative.  MUSCULOSKELETAL:  Negative  except for his history of rheumatoid arthritis.   PHYSICAL EXAMINATION:  VITAL SIGNS:  Temperature in the emergency department  was 102.9, pulse 95, respirations 24.  Saturations 96% on room air.  Blood  pressure 110/60.  GENERAL:  He is awake, alert, conversant.  HEENT:  Pupils are equal and reactive.  NECK:  Supple with no tenderness.  LUNGS:  Clear to auscultation with normal respiratory excursion.  HEART:  Regular rate and rhythm.  PMI is palpable in the left chest.  PULSES:  The distal pulses are 2+ and equal bilaterally.  ABDOMEN:  Soft, nondistended.  He does have some tenderness in his left mid  abdomen to left lower quadrant with no guarding or peritoneal signs.  Bowel  sounds are normal.  SKIN:  Scattered nevi but no rashes.  EXTREMITIES:  No significant edema.   LABORATORY DATA:  White blood cell count 13,000, hemoglobin 15.6, platelets  200.  Basic metabolic panel was unremarkable.  Liver function tests are  normal except for a bilirubin of 2.0.   CT scan of the abdomen and pelvis is as described above.   IMPRESSION:  Proximal sigmoid diverticulitis, with likely macroperforation  and phlegmon.   RECOMMENDATIONS:  I agree with his admission to Dr. Pincus Sanes service with  bowel rest and IV antibiotics.  He is receiving Unasyn.  I will follow him  along with you, and assure he improves without needing surgery in the acute  setting.  The plan was discussed in detail with the patient, and questions  were answered.                                               Gabrielle Dare Janee Morn, M.D.    BET/MEDQ  D:  04/14/2004  T:  04/16/2004  Job:  [id number]

## 2011-05-14 NOTE — Discharge Summary (Signed)
NAMEHORACE, [name]            ACCOUNT NO.:  [id number]   MEDICAL RECORD NO.:  [id number]          PATIENT TYPE:  INP   LOCATION:  5012                         FACILITY:  [facility]   PHYSICIAN:  Gabrielle Dare. Janee Morn, M.D.DATE OF BIRTH:  Apr 12, 1946   DATE OF ADMISSION:  02/07/2006  DATE OF DISCHARGE:  02/12/2006                                 DISCHARGE SUMMARY   DISCHARGE DIAGNOSES:  1.  Status post laparoscopic cholecystectomy.  2.  Pneumonia.  3.  Oral candidiasis.   HISTORY OF PRESENT ILLNESS:  The patient is a 65 year old male who had an  episode of choledocholithiasis and sepsis from cholangitis a couple months  ago.  He underwent ERCP at that time and stent was placed.  He now presents  for elective cholecystectomy.   HOSPITAL COURSE:  The patient underwent an uneventful laparoscopic  cholecystectomy with intraoperative cholangiogram.  Postoperatively, he  developed some fever and cough.  He was empirically placed on Cipro.  Liver  function tests were normal.  He was continued on his medications for  rheumatoid arthritis.  White blood cell count was somewhat elevated around  14,000.  Chest x-ray demonstrated right hilar infiltrate and he was changed  to Avelox p.o.  Over the next couple days he defervesced, had good pulmonary  toilet and became afebrile.  White blood cell count normalized to 8.7 and on  postoperative day #6 he was discharged home in stable condition.   DISCHARGE DIET:  Low-fat.   DISCHARGE ACTIVITY:  As tolerated including no restrictions he has from his  femur fracture in the past. No lifting.   FOLLOW UP:  Follow up is with myself in two weeks and with Dr. Leary Roca to  evaluate for stent removal.      Gabrielle Dare. Janee Morn, M.D.  Electronically Signed     BET/MEDQ  D:  03/24/2006  T:  03/25/2006  Job:  [id number]

## 2011-05-14 NOTE — H&P (Signed)
[name], [name]            ACCOUNT NO.:  [id number]   MEDICAL RECORD NO.:  [id number]          PATIENT TYPE:  INP   LOCATION:  1823                         FACILITY:  [facility]   PHYSICIAN:  Loreta Ave, M.D. DATE OF BIRTH:  1946-02-13   DATE OF ADMISSION:  10/23/2005  DATE OF DISCHARGE:                                HISTORY & PHYSICAL   CHIEF COMPLAINT:  Left lower extremity pain.   HISTORY OF PRESENT ILLNESS:  A 65 year old white male states that he fell  from a 15-foot ladder at his home last night onto a concrete surface. He was  not found until this a.m. by a neighbor. He was brought to the [facility]  ED, and x-rays were taken which showed a proximal left femur fracture.  Denies any other injuries.   CURRENT MEDICATIONS:  1.  Lipitor.  2.  Prednisone.  3.  Prilosec.   ALLERGIES:  No known drug allergies.   PAST MEDICAL AND SURGICAL HISTORY:  1.  Diverticulitis.  2.  Sigmoidectomy 2005.  3.  GERD.  4.  Hyperlipidemia.  5.  Rheumatoid arthritis.  6.  Left foot neuropathy.   FAMILY HISTORY:  Noncontributory.   SOCIAL HISTORY:  The patient admits ETOH use and is smoking one pack per  day.   REVIEW OF SYSTEMS:  Denies fevers, chills, lightheadedness, dizziness,  seizures, chest pain, palpitations, shortness of breath, abdominal pain.   PHYSICAL EXAMINATION:  VITAL SIGNS:  Temperature 97, pulse 103, respirations  26, blood pressure 160/87.  GENERAL:  Pleasant white male alert and oriented x3 and lying in some  discomfort on gurney.  NECK:  Good range of motion. Supple, nontender. Carotids are intact.  LUNGS:  CTA bilaterally. No wheezes, rales, or rhonchi.  HEART:  RRR. Tachycardic. S1 and S2. No murmurs, rubs, or gallops noted.  ABDOMEN:  Round. Nondistended. NBS x4. Soft. Nontender.  GENITOURINARY:  Not examined.  EXTREMITIES:  On the right, the patient has a positive log roll. He is  neurovascularly intact distally.  SKIN:  Warm and dry.   STUDIES:   X-rays show proximal left femur fracture. Sodium 136, potassium  4.2, chloride 102, CO2 24, BUN 14, creatinine 0.9, glucose 107. WBC 19.0,  hematocrit 38.8, hemoglobin 13.3, platelets 187. AST 50, ALT 27, alkaline  phosphatase 62, PTT 31, PT 13.7, INR 1.0.   ASSESSMENT:  Proximal left femur fracture after fall off ladder last night.   PLAN:  Will send patient to the OR for open reduction and internal fixation  of left femur today. Surgical procedure discussed with patient along with  the need for this. Ancef 1 g IV upon induction.      Genene Churn. Denton Meek.      Loreta Ave, M.D.  Electronically Signed    JMO/MEDQ  D:  10/23/2005  T:  10/23/2005  Job:  [id number]

## 2011-05-28 ENCOUNTER — Ambulatory Visit: Payer: Self-pay | Admitting: Internal Medicine

## 2011-06-27 ENCOUNTER — Ambulatory Visit: Payer: Self-pay | Admitting: Internal Medicine

## 2011-07-23 ENCOUNTER — Ambulatory Visit: Payer: Self-pay | Admitting: Internal Medicine

## 2011-07-28 ENCOUNTER — Ambulatory Visit: Payer: Self-pay | Admitting: Internal Medicine

## 2011-09-23 LAB — CBC
MCHC: 34.3
Platelets: 183
RBC: 4.55
WBC: 10.4

## 2011-09-23 LAB — COMPREHENSIVE METABOLIC PANEL
CO2: 27
Chloride: 105
Creatinine, Ser: 1
GFR calc Af Amer: >60
Glucose, Bld: 158 — ABNORMAL HIGH
Potassium: 4.4
Sodium: 140
Total Bilirubin: 0.8
Total Protein: 6.5

## 2011-09-23 LAB — POCT CARDIAC MARKERS: Operator id: 272551

## 2011-09-27 ENCOUNTER — Ambulatory Visit: Payer: Self-pay | Admitting: Internal Medicine

## 2011-10-15 DIAGNOSIS — K8309 Other cholangitis: Secondary | ICD-10-CM | POA: Insufficient documentation

## 2011-10-15 DIAGNOSIS — K224 Dyskinesia of esophagus: Secondary | ICD-10-CM | POA: Insufficient documentation

## 2011-10-15 DIAGNOSIS — K269 Duodenal ulcer, unspecified as acute or chronic, without hemorrhage or perforation: Secondary | ICD-10-CM | POA: Insufficient documentation

## 2011-10-15 DIAGNOSIS — K222 Esophageal obstruction: Secondary | ICD-10-CM | POA: Insufficient documentation

## 2011-10-15 DIAGNOSIS — K264 Chronic or unspecified duodenal ulcer with hemorrhage: Secondary | ICD-10-CM | POA: Insufficient documentation

## 2011-10-25 ENCOUNTER — Ambulatory Visit: Payer: Self-pay | Admitting: Internal Medicine

## 2011-10-28 ENCOUNTER — Ambulatory Visit: Payer: Self-pay | Admitting: Internal Medicine

## 2011-11-29 ENCOUNTER — Ambulatory Visit: Payer: Self-pay | Admitting: Internal Medicine

## 2011-12-28 ENCOUNTER — Ambulatory Visit: Payer: Self-pay | Admitting: Internal Medicine

## 2012-01-28 ENCOUNTER — Ambulatory Visit: Payer: Self-pay | Admitting: Internal Medicine

## 2012-02-25 ENCOUNTER — Ambulatory Visit: Payer: Self-pay | Admitting: Internal Medicine

## 2012-04-03 ENCOUNTER — Ambulatory Visit: Payer: Self-pay

## 2012-04-26 ENCOUNTER — Ambulatory Visit: Payer: Self-pay

## 2012-05-27 ENCOUNTER — Ambulatory Visit: Payer: Self-pay

## 2012-06-26 ENCOUNTER — Ambulatory Visit: Payer: Self-pay

## 2012-06-26 ENCOUNTER — Ambulatory Visit: Payer: Self-pay | Admitting: Internal Medicine

## 2012-06-26 LAB — CBC WITH DIFFERENTIAL/PLATELET
Basophil #: 0.1 10*3/uL (ref 0.0–0.1)
Basophil %: 0.6 %
Eosinophil #: 0.1 10*3/uL (ref 0.0–0.7)
Eosinophil %: 1.1 %
HCT: 44.1 % (ref 40.0–52.0)
HGB: 14.8 g/dL (ref 13.0–18.0)
Lymphocyte #: 1.3 10*3/uL (ref 1.0–3.6)
MCH: 34.1 pg — ABNORMAL HIGH (ref 26.0–34.0)
MCHC: 33.6 g/dL (ref 32.0–36.0)
MCV: 102 fL — ABNORMAL HIGH (ref 80–100)
Monocyte #: 0.7 x10 3/mm (ref 0.2–1.0)
Neutrophil #: 9.3 10*3/uL — ABNORMAL HIGH (ref 1.4–6.5)
RDW: 15.7 % — ABNORMAL HIGH (ref 11.5–14.5)

## 2012-06-26 LAB — CREATININE, SERUM
Creatinine: 1 mg/dL (ref 0.60–1.30)
EGFR (African American): >60
EGFR (Non-African Amer.): >60

## 2012-06-26 LAB — SGOT (AST)(ARMC): SGOT(AST): 17 U/L (ref 15–37)

## 2012-07-27 ENCOUNTER — Ambulatory Visit: Payer: Self-pay

## 2012-08-14 ENCOUNTER — Inpatient Hospital Stay (HOSPITAL_COMMUNITY)
Admission: EM | Admit: 2012-08-14 | Discharge: 2012-08-16 | DRG: 948 | Disposition: A | Discharge status: Home / Self Care | Payer: Medicare Other | Attending: Internal Medicine | Admitting: Internal Medicine

## 2012-08-14 ENCOUNTER — Encounter (HOSPITAL_COMMUNITY): Payer: Self-pay | Admitting: *Deleted

## 2012-08-14 ENCOUNTER — Emergency Department (HOSPITAL_COMMUNITY): Payer: Medicare Other

## 2012-08-14 DIAGNOSIS — R0989 Other specified symptoms and signs involving the circulatory and respiratory systems: Secondary | ICD-10-CM

## 2012-08-14 DIAGNOSIS — R531 Weakness: Secondary | ICD-10-CM

## 2012-08-14 DIAGNOSIS — K219 Gastro-esophageal reflux disease without esophagitis: Secondary | ICD-10-CM

## 2012-08-14 DIAGNOSIS — M069 Rheumatoid arthritis, unspecified: Secondary | ICD-10-CM

## 2012-08-14 DIAGNOSIS — J439 Emphysema, unspecified: Secondary | ICD-10-CM | POA: Diagnosis present

## 2012-08-14 DIAGNOSIS — J438 Other emphysema: Secondary | ICD-10-CM

## 2012-08-14 DIAGNOSIS — R0600 Dyspnea, unspecified: Secondary | ICD-10-CM | POA: Diagnosis present

## 2012-08-14 DIAGNOSIS — J479 Bronchiectasis, uncomplicated: Secondary | ICD-10-CM | POA: Diagnosis present

## 2012-08-14 DIAGNOSIS — Z79899 Other long term (current) drug therapy: Secondary | ICD-10-CM

## 2012-08-14 DIAGNOSIS — I10 Essential (primary) hypertension: Secondary | ICD-10-CM

## 2012-08-14 DIAGNOSIS — E785 Hyperlipidemia, unspecified: Secondary | ICD-10-CM | POA: Diagnosis present

## 2012-08-14 DIAGNOSIS — Z85820 Personal history of malignant melanoma of skin: Secondary | ICD-10-CM

## 2012-08-14 DIAGNOSIS — R5381 Other malaise: Principal | ICD-10-CM

## 2012-08-14 DIAGNOSIS — IMO0002 Reserved for concepts with insufficient information to code with codable children: Secondary | ICD-10-CM

## 2012-08-14 DIAGNOSIS — Z87891 Personal history of nicotine dependence: Secondary | ICD-10-CM

## 2012-08-14 HISTORY — DX: Gastro-esophageal reflux disease without esophagitis: K21.9

## 2012-08-14 HISTORY — DX: Essential (primary) hypertension: I10

## 2012-08-14 HISTORY — DX: Malignant melanoma of skin, unspecified: C43.9

## 2012-08-14 HISTORY — DX: Hyperlipidemia, unspecified: E78.5

## 2012-08-14 LAB — CARDIAC PANEL(CRET KIN+CKTOT+MB+TROPI)
Relative Index: INVALID (ref 0.0–2.5)
Total CK: 25 U/L (ref 7–232)
Troponin I: <0.3 ng/mL (ref <0.30)

## 2012-08-14 LAB — PRO B NATRIURETIC PEPTIDE: Pro B Natriuretic peptide (BNP): 407.3 pg/mL — ABNORMAL HIGH (ref 0–125)

## 2012-08-14 LAB — COMPREHENSIVE METABOLIC PANEL
AST: 24 U/L (ref 0–37)
Albumin: 3.3 g/dL — ABNORMAL LOW (ref 3.5–5.2)
Calcium: 9.9 mg/dL (ref 8.4–10.5)
Chloride: 101 mEq/L (ref 96–112)
Creatinine, Ser: 1.14 mg/dL (ref 0.50–1.35)

## 2012-08-14 LAB — URINALYSIS, ROUTINE W REFLEX MICROSCOPIC
Bilirubin Urine: NEGATIVE
Leukocytes, UA: NEGATIVE
Nitrite: NEGATIVE
Specific Gravity, Urine: <1.005 — ABNORMAL LOW (ref 1.005–1.030)
pH: 6 (ref 5.0–8.0)

## 2012-08-14 LAB — BLOOD GAS, ARTERIAL
Acid-base deficit: 4.3 mmol/L — ABNORMAL HIGH (ref 0.0–2.0)
Bicarbonate: 18.7 mEq/L — ABNORMAL LOW (ref 20.0–24.0)
O2 Saturation: 97.5 %
Patient temperature: 98.6
TCO2: 19.4 mmol/L (ref 0–100)

## 2012-08-14 LAB — CBC
HCT: 36.1 % — ABNORMAL LOW (ref 39.0–52.0)
Hemoglobin: 12.7 g/dL — ABNORMAL LOW (ref 13.0–17.0)
Hemoglobin: 12.4 g/dL — ABNORMAL LOW (ref 13.0–17.0)
MCH: 32.5 pg (ref 26.0–34.0)
MCHC: 35.2 g/dL (ref 30.0–36.0)
MCV: 91.6 fL (ref 78.0–100.0)
Platelets: 270 10*3/uL (ref 150–400)
RBC: 3.81 MIL/uL — ABNORMAL LOW (ref 4.22–5.81)

## 2012-08-14 LAB — SEDIMENTATION RATE: Sed Rate: 97 mm/hr — ABNORMAL HIGH (ref 0–16)

## 2012-08-14 LAB — LIPASE, BLOOD: Lipase: 22 U/L (ref 11–59)

## 2012-08-14 MED ORDER — ATORVASTATIN CALCIUM 20 MG PO TABS: 20.0000 mg | ORAL_TABLET | Freq: Every day | ORAL | Status: DC

## 2012-08-14 MED ORDER — FLUTICASONE PROPIONATE 50 MCG/ACT NA SUSP
2.0000 | Freq: Every day | NASAL | Status: DC
  Administered 2012-08-14 – 2012-08-16 (×2): 2 via NASAL
  Filled 2012-08-14: qty 16

## 2012-08-14 MED ORDER — PANTOPRAZOLE SODIUM 40 MG PO TBEC
80.0000 mg | DELAYED_RELEASE_TABLET | Freq: Two times a day (BID) | ORAL | Status: DC
  Administered 2012-08-15 – 2012-08-16 (×3): 80 mg via ORAL
  Filled 2012-08-14 (×3): qty 2

## 2012-08-14 MED ORDER — ALBUTEROL SULFATE (5 MG/ML) 0.5% IN NEBU
2.5000 mg | INHALATION_SOLUTION | Freq: Four times a day (QID) | RESPIRATORY_TRACT | Status: DC
  Administered 2012-08-14: 2.5 mg via RESPIRATORY_TRACT
  Filled 2012-08-14: qty 0.5

## 2012-08-14 MED ORDER — IPRATROPIUM BROMIDE 0.02 % IN SOLN
0.5000 mg | Freq: Two times a day (BID) | RESPIRATORY_TRACT | Status: DC
  Administered 2012-08-15 – 2012-08-16 (×2): 0.5 mg via RESPIRATORY_TRACT
  Filled 2012-08-14 (×2): qty 2.5

## 2012-08-14 MED ORDER — IOHEXOL 350 MG/ML SOLN
100.0000 mL | Freq: Once | INTRAVENOUS | Status: AC | PRN
  Administered 2012-08-14: 100 mL via INTRAVENOUS

## 2012-08-14 MED ORDER — ONDANSETRON HCL 4 MG/2ML IJ SOLN: 4.0000 mg | Freq: Four times a day (QID) | INTRAMUSCULAR | Status: DC | PRN

## 2012-08-14 MED ORDER — SODIUM CHLORIDE 0.9 % IJ SOLN
3.0000 mL | Freq: Two times a day (BID) | INTRAMUSCULAR | Status: DC
  Administered 2012-08-14 – 2012-08-16 (×2): 3 mL via INTRAVENOUS

## 2012-08-14 MED ORDER — IPRATROPIUM BROMIDE 0.02 % IN SOLN
0.5000 mg | Freq: Four times a day (QID) | RESPIRATORY_TRACT | Status: DC
  Administered 2012-08-14: 0.5 mg via RESPIRATORY_TRACT
  Filled 2012-08-14: qty 2.5

## 2012-08-14 MED ORDER — ACETAMINOPHEN 325 MG PO TABS: 650.0000 mg | ORAL_TABLET | ORAL | Status: DC | PRN

## 2012-08-14 MED ORDER — AZELASTINE HCL 0.1 % NA SOLN
1.0000 | Freq: Two times a day (BID) | NASAL | Status: DC
  Administered 2012-08-14 – 2012-08-16 (×2): 1 via NASAL
  Filled 2012-08-14: qty 30

## 2012-08-14 MED ORDER — ENOXAPARIN SODIUM 40 MG/0.4ML ~~LOC~~ SOLN
40.0000 mg | SUBCUTANEOUS | Status: DC
  Administered 2012-08-14 – 2012-08-15 (×2): 40 mg via SUBCUTANEOUS
  Filled 2012-08-14 (×3): qty 0.4

## 2012-08-14 MED ORDER — PREDNISONE 50 MG PO TABS
60.0000 mg | ORAL_TABLET | Freq: Every day | ORAL | Status: DC
  Filled 2012-08-14 (×2): qty 1

## 2012-08-14 MED ORDER — ALBUTEROL SULFATE (5 MG/ML) 0.5% IN NEBU
2.5000 mg | INHALATION_SOLUTION | Freq: Two times a day (BID) | RESPIRATORY_TRACT | Status: DC
  Administered 2012-08-15 – 2012-08-16 (×2): 2.5 mg via RESPIRATORY_TRACT
  Filled 2012-08-14 (×2): qty 0.5

## 2012-08-14 MED ORDER — ALBUTEROL SULFATE (5 MG/ML) 0.5% IN NEBU: 2.5000 mg | INHALATION_SOLUTION | Freq: Four times a day (QID) | RESPIRATORY_TRACT | Status: DC | PRN

## 2012-08-14 MED ORDER — SODIUM CHLORIDE 0.9 % IV BOLUS (SEPSIS)
1000.0000 mL | Freq: Once | INTRAVENOUS | Status: AC
  Administered 2012-08-14: 1000 mL via INTRAVENOUS

## 2012-08-14 MED ORDER — IRBESARTAN 300 MG PO TABS
300.0000 mg | ORAL_TABLET | Freq: Every day | ORAL | Status: DC
  Administered 2012-08-15 – 2012-08-16 (×2): 300 mg via ORAL
  Filled 2012-08-14 (×3): qty 1

## 2012-08-14 MED ORDER — TADALAFIL 5 MG PO TABS: 5.0000 mg | ORAL_TABLET | Freq: Every day | ORAL | Status: DC

## 2012-08-14 MED ORDER — SODIUM CHLORIDE 0.9 % IV SOLN: 250.0000 mL | INTRAVENOUS | Status: DC | PRN

## 2012-08-14 MED ORDER — SODIUM CHLORIDE 0.9 % IV BOLUS (SEPSIS): 1000.0000 mL | Freq: Once | INTRAVENOUS | Status: DC

## 2012-08-14 MED ORDER — FUROSEMIDE 10 MG/ML IJ SOLN
40.0000 mg | Freq: Two times a day (BID) | INTRAMUSCULAR | Status: DC
  Filled 2012-08-14: qty 4

## 2012-08-14 MED ORDER — SODIUM CHLORIDE 0.9 % IJ SOLN: 3.0000 mL | INTRAMUSCULAR | Status: DC | PRN

## 2012-08-14 MED ORDER — PREDNISONE 50 MG PO TABS: 60.0000 mg | ORAL_TABLET | Freq: Every day | ORAL | Status: DC

## 2012-08-14 NOTE — ED Notes (Signed)
Pt reports sob, headache, and neck pain x 1 week, progressively worsening. Respirations 25. 02 sats 100% on RA. Pt sent from [facility] physicians. No fever.

## 2012-08-14 NOTE — H&P (Signed)
History and Physical       Hospital Admission Note Date: 08/14/2012  Patient name: [name] Medical record number: [id number] Date of birth: Nov 21, 1946 Age: 66 y.o. Gender: male PCP: Mickie Hillier, MD  Primary rheumatologist: Abran Cantor, [facility]  Chief Complaint:  Worsening dyspnea and progressive weakness in the last 1 month.  HPI: Patient is a 66 year old male who has history of moderate to severe rheumatoid arthritis (on prednisone, methotrexate, abatacept), former heavy smoker, hypertension, severe GERD (status post Botox injection at [facility]) was sent by his PCP for further evaluation of his unexplained progressive weakness and worsening dyspnea in the last 1 month. Patient reports that he has no history of cardiac or pulmonary problems however in the last 1 month he is having progressive weakness and shortness of breath. In the last 2 weeks, patient has been too weak to even go to the grocery store. He denies any gain in weight or peripheral edema but has not been eating well either. He has no prior history of heart failure. Patient states that in the last 1 week he's been having some chills but no fever, some intermittent coughing with phlegm, he went to his PCP last week and was started on Levaquin with no improvement in his symptoms. He presented to his PCP office today again and was sent to the ED for evaluation. In the ED workup, chest x-ray showed chronic lung disease with hyperinflation and pulmonary scarring but no active process. 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 BNP is elevated at 407.3.  Of note, patient was a heavy nicotine abuser, smoked 2 ppd x 50years, worked in [place] for 30 years and per patient, had chemical  inhalation with his work/job. Patient also states that he has missed his prednisone for 4-5 days and just filled his prescription.  Review of Systems:  Constitutional: Please see history of present illness  HEENT: Denies photophobia, eye pain, redness, hearing loss, ear pain, congestion, sore throat, rhinorrhea, sneezing, mouth sores, trouble swallowing, neck pain, neck stiffness and tinnitus.   Respiratory: please see history of present illness  Cardiovascular: Denies chest pain, palpitations and leg swelling.  Gastrointestinal: Denies nausea, vomiting, abdominal pain, diarrhea, constipation, blood in stool and abdominal distention.  he has a history of severe GERD, recently had Botox injection to esophagus last month at [facility] which did improve his nutrition. Genitourinary: Denies dysuria, urgency, frequency, hematuria, flank pain and difficulty urinating.  Musculoskeletal: Patient feels progressive weakness and myalgias  Skin: Denies pallor, rash and wound.  Neurological: Denies any seizures or syncopal episode however has significant generalized weakness Hematological: Denies adenopathy. Easy bruising, personal or family bleeding history  Psychiatric/Behavioral: Denies suicidal ideation, mood changes, confusion, nervousness, sleep disturbance and agitation  Past Medical History: Past Medical History  Diagnosis Date  . Arthritis   . Hypertension   . Dyslipidemia   . GERD (gastroesophageal reflux disease)   . Melanoma    No past surgical history on file.  Medications: Prior to Admission medications   Medication Sig Start Date End Date Taking? Authorizing Provider  atorvastatin (LIPITOR) 20 MG tablet Take 20 mg by mouth daily.   Yes Historical Provider, MD  azelastine (ASTELIN) 137 MCG/SPRAY nasal spray Place 1 spray into the nose 2 (two) times daily. Use in each nostril as directed  Yes Historical Provider, MD  esomeprazole (NEXIUM) 40 MG capsule Take 40 mg by mouth daily before  breakfast.   Yes Historical Provider, MD  fluticasone (FLONASE) 50 MCG/ACT nasal spray Place 2 sprays into the nose daily.   Yes Historical Provider, MD  irbesartan (AVAPRO) 300 MG tablet Take 300 mg by mouth daily.   Yes Historical Provider, MD  methotrexate (RHEUMATREX) 2.5 MG tablet Take 120.5 mg by mouth once a week. Takes 5 tablets of 2.5 mg weekly   Yes Historical Provider, MD  omeprazole (PRILOSEC) 40 MG capsule Take 40 mg by mouth daily.   Yes Historical Provider, MD  prednisoLONE 5 MG TABS Take 5 mg by mouth daily.   Yes Historical Provider, MD  tadalafil (CIALIS) 5 MG tablet Take 5 mg by mouth daily.   Yes Historical Provider, MD  abatacept (ORENCIA) 250 MG injection Inject into the vein     Historical Provider, MD    Allergies:  No Known Allergies  Social History: Patient states that he was a heavy smoker, 2 packs per day for at least 50 years. He drinks alcohol occasionally and denies any drug use he currently lives at home alone. He worked in a Facilities manager.  Family History: No family history on file.  Physical Exam: Blood pressure 125/64, pulse 67, temperature 97.8 F (36.6 C), temperature source Oral, resp. rate 20, SpO2 100.00%. General: Alert, awake, oriented x3, in no acute distress, frustrated and sickly appearing. HEENT: normocephalic, atraumatic, anicteric sclera, pink conjunctiva, pupils equal and reactive to light and accomodation, oropharynx clear Neck: supple, no masses or lymphadenopathy, no goiter, no bruits  Heart: Regular rate and rhythm, without murmurs, rubs or gallops. Lungs: Decreased breath sounds bases but fairly clear Abdomen: Soft, nontender, nondistended, positive bowel sounds, no masses. Extremities: No clubbing, cyanosis or edema with positive pedal pulses. Neuro: Grossly intact, no focal neurological deficits, strength 5/5 upper and lower extremities bilaterally Psych: alert and oriented x 3, normal mood and affect Skin: no rashes or  lesions, warm and dry   LABS on Admission:  Basic Metabolic Panel:  Lab 08/14/12 7829  NA 136  K 4.3  CL 101  CO2 22  GLUCOSE 137*  BUN 16  CREATININE 1.14  CALCIUM 9.9  MG --  PHOS --   Liver Function Tests:  Lab 08/14/12 1336  AST 24  ALT 19  ALKPHOS 68  BILITOT 0.5  PROT 7.0  ALBUMIN 3.3*    Lab 08/14/12 1335  LIPASE 22  AMYLASE --     Lab 08/14/12 1335  WBC 11.2*  NEUTROABS --  HGB 12.7*  HCT 36.1*  MCV 92.8  PLT 255   Cardiac Enzymes:  Lab 08/14/12 1336  CKTOTAL --  CKMB --  CKMBINDEX --  TROPONINI <0.30     Radiological Exams on Admission: Dg Chest 2 View  08/14/2012  *RADIOLOGY REPORT*  Clinical Data: Short of breath.  Hypertension.  CHEST - 2 VIEW  Comparison: 08/09/2012 and multiple previous  Findings: Heart size is normal.  The mediastinum is unremarkable except for unfolding of the thoracic aorta.  Lungs are hyperinflated with scarring consistent with chronic emphysema. Chronically seen 9 mm nodule in the left mid to lower lung, variably demonstrated on prior studies.  No new or progressive disease identified.  IMPRESSION: Chronic lung disease with hyperinflation and pulmonary scarring. Chronic nodular shadow left mid lung.  No active process identified.   Original Report Authenticated By: Thomasenia Sales, M.D.    Ct Angio  Chest W/cm &/or Wo Cm  08/14/2012  *RADIOLOGY REPORT*  Clinical Data: Shortness of breath, elevated d-dimer, weakness, headache, neck pain, worsening symptoms over 1 week  CT ANGIOGRAPHY CHEST  Technique:  Multidetector CT imaging of the chest using the standard protocol during bolus administration of intravenous contrast. Multiplanar reconstructed images including MIPs were obtained and reviewed to evaluate the vascular anatomy.  Contrast: OMNIPAQUE IOHEXOL 350 MG/ML SOLN  Comparison: 08/23/2006  Findings: Scattered atherosclerotic calcifications aorta and coronary arteries. Minimal aneurysmal dilatation ascending thoracic  aorta 4.2 x 4.2 cm image 54. No gross evidence of aortic dissection. Small hiatal hernia. Visualized portion of upper abdomen otherwise unremarkable. Scattered normal-sized mediastinal lymph nodes. Respiratory motion artifacts at lung bases, degrading assessment. No definite pulmonary emboli identified.  Dependent atelectasis right greater than left lower lobes. Tubular intermediate to low attenuation structure in the left lower lobe is again identified, not demonstrating significant vascular enhancement to suggest a pulmonary vascular structure, question mucus plug within a bronchiectatic airway. A similar finding is seen in the left upper lobe image 32; both were present on the 2007 exam as well. Questionable 8 mm diameter left lower lobe nodular density on sagittal image 81 series 603 is shown to be a linear area of potential scarring on axial images. No additional pulmonary mass or nodule identified. No acute osseous findings.  IMPRESSION: No evidence of pulmonary embolism. Scattered atherosclerotic changes with minimal aneurysmal dilatation of ascending thoracic aorta 4.2 x 4.2 cm greatest size. Small hiatal hernia. Tubular/branching intermediate to low attenuation structure in left lower and left upper lobes, question mucus plugs, slightly increased in size since 2007 exam. Dependent atelectasis in lower lobes right greater than left.   Original Report Authenticated By: Lollie Marrow, M.D.     Assessment/Plan  Principal problem : .Dyspnea with progressive weakness: Multifactorial but unclear for now, patient may have underlying severe emphysema due to his past heavy nicotine abuse, new onset CHF, bronchiectasis (? Mucous plugging on the CT scan), pneumonitis/pulmonary fibrosis side effect from the immunosuppressants (methotrexate/ abatacept), rule out hypothyroidism.  - Admit to telemetry, obtain cardiac enzymes (? CPK to rule out rhabdomyolysis), 2-D echocardiogram, Lasix IV trial, obtain TSH, cortisol  level - Place him on scheduled nebs, O2, prednisone 60 mg, PFTs once more stable, pulmonology consult placed  Active problems  .Rheumatoid arthritis: - Placed on prednisone high dose but hold off on methotrexate and abatacept for now  - I did attempt to call patient's the rheumatologist, Dr. Alene Mires in [facility] rheumatology however their office was closed I will reattempt tomorrow   .HTN (hypertension): Continue Avapro   .GERD (gastroesophageal reflux disease): Continue PPI twice a day   .Emphysema: Likely secondary to former heavy smoking, see #1  DVT prophylaxis: Lovenox  CODE STATUS: Full code  Further plan will depend as patient's clinical course evolves and further radiologic and laboratory data become available.   Time Spent on Admission: 1 hour  Debrah Granderson M.D. [facility] 08/14/2012, 5:31 PM Pager: [phone]  If 7PM-7AM, please contact night-coverage [email or web address] Password [email or web address]

## 2012-08-14 NOTE — ED Provider Notes (Signed)
History     CSN: [id number]  Arrival date & time 08/14/12  1137   First MD Initiated Contact with Patient 08/14/12 1138      Chief Complaint  Patient presents with  . Shortness of Breath  . Headache  . Neck Pain     HPI The patient reports one week of worsening generalized progressive weakness.  Is also had no exertional shortness of breath.  He denies orthopnea.  He has no prior history of cardiac disease.  He reports his been too weak to even go to the grocery store when he does try to get aggressive pressure sore he would become too short of breath even by the time he reached a chronic pressure sore.  He has no prior history of heart failure.  No history of DVT or pulmonary embolism.  He has no new unilateral leg swelling.  He denies chest pain jaw pain shoulder pain.  He has had some headache and posterior neck pain.  He has had decreased oral intake.  He is on methotrexate for severe rheumatoid arthritis.  He was seen by his physician last week and started on a course of Levaquin without improvement in his symptoms and thus he presented to his PCP office today was sent to the emergency department for evaluation.   Past Medical History  Diagnosis Date  . Arthritis   . Hypertension   . Dyslipidemia   . GERD (gastroesophageal reflux disease)   . Melanoma     No past surgical history on file.  No family history on file.  History  Substance Use Topics  . Smoking status: Former Games developer  . Smokeless tobacco: Not on file  . Alcohol Use:       Review of Systems  All other systems reviewed and are negative.    Allergies  Review of patient's allergies indicates no known allergies.  Home Medications   Current Outpatient Rx  Name Route Sig Dispense Refill  . ATORVASTATIN CALCIUM 20 MG PO TABS Oral Take 20 mg by mouth daily.    . AZELASTINE HCL 137 MCG/SPRAY NA SOLN Nasal Place 1 spray into the nose 2 (two) times daily. Use in each nostril as directed    . ESOMEPRAZOLE  MAGNESIUM 40 MG PO CPDR Oral Take 40 mg by mouth daily before breakfast.    . FLUTICASONE PROPIONATE 50 MCG/ACT NA SUSP Nasal Place 2 sprays into the nose daily.    . IRBESARTAN 300 MG PO TABS Oral Take 300 mg by mouth daily.    Marland Kitchen METHOTREXATE 2.5 MG PO TABS Oral Take 120.5 mg by mouth once a week. Takes 5 tablets of 2.5 mg weekly    . OMEPRAZOLE 40 MG PO CPDR Oral Take 40 mg by mouth daily.    Marland Kitchen PREDNISOLONE 5 MG PO TABS Oral Take 5 mg by mouth daily.    Marland Kitchen TADALAFIL 5 MG PO TABS Oral Take 5 mg by mouth daily.      BP 134/63  Pulse 65  Temp 97.8 F (36.6 C) (Oral)  Resp 28  SpO2 100%  Physical Exam  Nursing note and vitals reviewed. Constitutional: He is oriented to person, place, and time. He appears well-developed and well-nourished.  HENT:  Head: Normocephalic and atraumatic.  Eyes: EOM are normal.  Neck: Normal range of motion.  Cardiovascular: Normal rate, regular rhythm, normal heart sounds and intact distal pulses.   Pulmonary/Chest: Effort normal and breath sounds normal. No respiratory distress.  Abdominal: Soft. He exhibits no  distension. There is no tenderness.  Musculoskeletal: Normal range of motion.  Neurological: He is alert and oriented to person, place, and time.  Skin: Skin is warm and dry.  Psychiatric: He has a normal mood and affect. Judgment normal.    ED Course  Procedures (including critical care time)   Date: 08/14/2012  Rate: 63  Rhythm: normal sinus rhythm  QRS Axis: normal  Intervals: normal  ST/T Wave abnormalities: normal  Conduction Disutrbances: none  Narrative Interpretation:   Old EKG Reviewed: No significant changes noted     Labs Reviewed  CBC - Abnormal; Notable for the following:    WBC 11.2 (*)     RBC 3.89 (*)     Hemoglobin 12.7 (*)     HCT 36.1 (*)     All other components within normal limits  PRO B NATRIURETIC PEPTIDE - Abnormal; Notable for the following:    Pro B Natriuretic peptide (BNP) 407.3 (*)     All other  components within normal limits  D-DIMER, QUANTITATIVE - Abnormal; Notable for the following:    D-Dimer, Quant 2.61 (*)     All other components within normal limits  COMPREHENSIVE METABOLIC PANEL - Abnormal; Notable for the following:    Glucose, Bld 137 (*)     Albumin 3.3 (*)     GFR calc non Af Amer 65 (*)     GFR calc Af Amer 76 (*)     All other components within normal limits  TROPONIN I  LIPASE, BLOOD  URINALYSIS, ROUTINE W REFLEX MICROSCOPIC  CORTISOL  TSH  T4, FREE   Dg Chest 2 View  08/14/2012  *RADIOLOGY REPORT*  Clinical Data: Short of breath.  Hypertension.  CHEST - 2 VIEW  Comparison: 08/09/2012 and multiple previous  Findings: Heart size is normal.  The mediastinum is unremarkable except for unfolding of the thoracic aorta.  Lungs are hyperinflated with scarring consistent with chronic emphysema. Chronically seen 9 mm nodule in the left mid to lower lung, variably demonstrated on prior studies.  No new or progressive disease identified.  IMPRESSION: Chronic lung disease with hyperinflation and pulmonary scarring. Chronic nodular shadow left mid lung.  No active process identified.   Original Report Authenticated By: Thomasenia Sales, M.D.    Ct Angio Chest W/cm &/or Wo Cm  08/14/2012  *RADIOLOGY REPORT*  Clinical Data: Shortness of breath, elevated d-dimer, weakness, headache, neck pain, worsening symptoms over 1 week  CT ANGIOGRAPHY CHEST  Technique:  Multidetector CT imaging of the chest using the standard protocol during bolus administration of intravenous contrast. Multiplanar reconstructed images including MIPs were obtained and reviewed to evaluate the vascular anatomy.  Contrast: OMNIPAQUE IOHEXOL 350 MG/ML SOLN  Comparison: 08/23/2006  Findings: Scattered atherosclerotic calcifications aorta and coronary arteries. Minimal aneurysmal dilatation ascending thoracic aorta 4.2 x 4.2 cm image 54. No gross evidence of aortic dissection. Small hiatal hernia. Visualized  portion of upper abdomen otherwise unremarkable. Scattered normal-sized mediastinal lymph nodes. Respiratory motion artifacts at lung bases, degrading assessment. No definite pulmonary emboli identified.  Dependent atelectasis right greater than left lower lobes. Tubular intermediate to low attenuation structure in the left lower lobe is again identified, not demonstrating significant vascular enhancement to suggest a pulmonary vascular structure, question mucus plug within a bronchiectatic airway. A similar finding is seen in the left upper lobe image 32; both were present on the 2007 exam as well. Questionable 8 mm diameter left lower lobe nodular density on sagittal image 81 series  603 is shown to be a linear area of potential scarring on axial images. No additional pulmonary mass or nodule identified. No acute osseous findings.  IMPRESSION: No evidence of pulmonary embolism. Scattered atherosclerotic changes with minimal aneurysmal dilatation of ascending thoracic aorta 4.2 x 4.2 cm greatest size. Small hiatal hernia. Tubular/branching intermediate to low attenuation structure in left lower and left upper lobes, question mucus plugs, slightly increased in size since 2007 exam. Dependent atelectasis in lower lobes right greater than left.   Original Report Authenticated By: Lollie Marrow, M.D.     I personally reviewed the imaging tests through PACS system  I reviewed available ER/hospitalization records thought the EMR   1. Weakness   2. Dyspnea       MDM  No clear etiology found for her symptoms on the emergency department today.  I think the patient will need additional workup in the hospital for workup of his symptoms.  Spoke with Dr Isidoro Donning who evaluate at the bedside       Lyanne Co, MD 08/14/12 [phone]

## 2012-08-14 NOTE — ED Notes (Signed)
Patient attempted to urinate but was not able to. Will call when ready.

## 2012-08-14 NOTE — ED Notes (Signed)
Pt states "I've had a headache for 2 weeks, neck pain just started, and the shortness of breath is getting better. I just haven't been feeling well for a couple months." Pt is in no resp distress, 100% on 2L o2 via [place]. Pt has been taking percocet for headache and it has been helping. Pt denies any injury in neck, states "the pain just started." Able to move neck with full ROM.

## 2012-08-14 NOTE — Consult Note (Signed)
Name: TABIUS ROOD MRN: [id number] DOB: [birth year]    LOS: 0  Referring Provider:  [facility] Reason for Referral:  Dyspnea  PULMONARY / CRITICAL CARE MEDICINE  HPI:  66 yo former smoker (100 pack-years history) with rheumatoid arthritis on Prednisone (did not take 5 days prior to admission) and Methotrexate Abran Cantor, MD, [facility]), RA related pulmonary nodules  Jomarie Longs. Blenda Peals, MD, [facility]) and severe GERD ( s/p Botox injection several months ago) admitted for evaluation of progressive weakness and "dyspnea" since about one month ago.  Denies being short of breath at rest.  On exertion, reports stopping activity because of severe weakness, but denies air hunger, gasping for air, being unable to take deep breath in, increased respirations or wheezing. Denies chest pain. There is intermittent cough with clear sputum. Denies fever, purulent sputum or hemoptysis.  Was treated with Levaquin for one week for suspected pulmonary infection, but it did not improved symptoms.  Denies symptoms prior to  Botox injection.  Past Medical History  Diagnosis Date  . Arthritis   . Hypertension   . Dyslipidemia   . GERD (gastroesophageal reflux disease)   . Melanoma    No past surgical history on file. Prior to Admission medications   Medication Sig Start Date End Date Taking? Authorizing Provider  atorvastatin (LIPITOR) 20 MG tablet Take 20 mg by mouth daily.   Yes Historical Provider, MD  azelastine (ASTELIN) 137 MCG/SPRAY nasal spray Place 1 spray into the nose 2 (two) times daily. Use in each nostril as directed   Yes Historical Provider, MD  esomeprazole (NEXIUM) 40 MG capsule Take 40 mg by mouth daily before breakfast.   Yes Historical Provider, MD  fluticasone (FLONASE) 50 MCG/ACT nasal spray Place 2 sprays into the nose daily.   Yes Historical Provider, MD  irbesartan (AVAPRO) 300 MG tablet Take 300 mg by mouth daily.   Yes Historical Provider, MD  methotrexate (RHEUMATREX) 2.5 MG tablet  Take 12.5 mg by mouth once a week. Caution:Chemotherapy. Protect from light.; takes 5 tablets (12.5mg ) weekly on Mondays   Yes Historical Provider, MD  omeprazole (PRILOSEC) 40 MG capsule Take 40 mg by mouth daily.   Yes Historical Provider, MD  predniSONE (DELTASONE) 5 MG tablet Take 5 mg by mouth daily.   Yes Historical Provider, MD  tadalafil (CIALIS) 5 MG tablet Take 5 mg by mouth daily.   Yes Historical Provider, MD  abatacept (ORENCIA) 250 MG injection Inject into the vein.    Historical Provider, MD   Allergies No Known Allergies  Family History No family history on file. Social History  reports that he has quit smoking. He does not have any smokeless tobacco history on file. His alcohol and drug histories not on file.  Review Of Systems: Constitutional: Please see history of present illness  HEENT: Denies photophobia, eye pain, redness, hearing loss, ear pain, congestion, sore throat, rhinorrhea, sneezing, mouth sores, trouble swallowing, neck pain, neck stiffness and tinnitus.  Respiratory: please see history of present illness  Cardiovascular: Denies chest pain, palpitations and leg swelling.  Gastrointestinal: Denies nausea, vomiting, abdominal pain, diarrhea, constipation, blood in stool and abdominal distention. he has a history of severe GERD, recently had Botox injection to esophagus last month at [facility] which did improve his nutrition.  Genitourinary: Denies dysuria, urgency, frequency, hematuria, flank pain and difficulty urinating.  Musculoskeletal: Patient feels progressive weakness and myalgias  Skin: Denies pallor, rash and wound.  Neurological: Denies any seizures or syncopal episode however  has significant generalized weakness  Hematological: Denies adenopathy. Easy bruising, personal or family bleeding history  Psychiatric/Behavioral: Denies suicidal ideation, mood changes, confusion, nervousness, sleep disturbance and agitation  Vital Signs: Temp:  [97.8 F (36.6  C)-98.4 F (36.9 C)] 98.4 F (36.9 C) (08/19 1800) Pulse Rate:  [61-77] 61  (08/19 1800) Resp:  [14-28] 22  (08/19 1800) BP: (118-145)/(60-76) 128/68 mmHg (08/19 1800) SpO2:  [100 %] 100 % (08/19 1800) FiO2 (%):  [2 %] 2 % (08/19 1146)  Physical Examination: General:  Anxious, no respiratory distress Neuro:  Awake, alert, cooperative, no focal deficits HEENT:  PERRL Neck:  No JVD Cardiovascular:  RRR, no m/r/g Lungs:  Bilateral air entry, no w/r/r Abdomen:  Soft, nontender, nondistended, bowel sounds present Musculoskeletal:  Moves all extremities, equal strength 5/5 Skin:  No rash  Labs:  Lab 08/14/12 1817 08/14/12 1815 08/14/12 1336 08/14/12 1335  HGB 12.4* -- -- 12.7*  HCT 34.9* -- -- 36.1*  WBC 10.0 -- -- 11.2*  PLT 270 -- -- 255  NA -- -- 136 --  K -- -- 4.3 --  CL -- -- 101 --  CO2 -- -- 22 --  GLUCOSE -- -- 137* --  BUN -- -- 16 --  CREATININE 1.20 -- 1.14 --  CALCIUM -- -- 9.9 --  MG -- -- -- --  PHOS -- -- -- --  AST -- -- 24 --  ALT -- -- 19 --  ALKPHOS -- -- 68 --  BILITOT -- -- 0.5 --  PROT -- -- 7.0 --  ALBUMIN -- -- 3.3* --  INR -- -- -- --  APTT -- -- -- --  INR -- -- -- --  LATICACIDVEN -- -- -- --  TROPONINI -- <0.30 <0.30 --   Imaging (reviewed):  Dg Chest 2 View  08/14/2012  *RADIOLOGY REPORT*  Clinical Data: Short of breath.  Hypertension.  CHEST - 2 VIEW  Comparison: 08/09/2012 and multiple previous  Findings: Heart size is normal.  The mediastinum is unremarkable except for unfolding of the thoracic aorta.  Lungs are hyperinflated with scarring consistent with chronic emphysema. Chronically seen 9 mm nodule in the left mid to lower lung, variably demonstrated on prior studies.  No new or progressive disease identified.  IMPRESSION: Chronic lung disease with hyperinflation and pulmonary scarring. Chronic nodular shadow left mid lung.  No active process identified.   Original Report Authenticated By: Thomasenia Sales, M.D.    Ct Angio Chest  W/cm &/or Wo Cm  08/14/2012  *RADIOLOGY REPORT*  Clinical Data: Shortness of breath, elevated d-dimer, weakness, headache, neck pain, worsening symptoms over 1 week  CT ANGIOGRAPHY CHEST  Technique:  Multidetector CT imaging of the chest using the standard protocol during bolus administration of intravenous contrast. Multiplanar reconstructed images including MIPs were obtained and reviewed to evaluate the vascular anatomy.  Contrast: OMNIPAQUE IOHEXOL 350 MG/ML SOLN  Comparison: 08/23/2006  Findings: Scattered atherosclerotic calcifications aorta and coronary arteries. Minimal aneurysmal dilatation ascending thoracic aorta 4.2 x 4.2 cm image 54. No gross evidence of aortic dissection. Small hiatal hernia. Visualized portion of upper abdomen otherwise unremarkable. Scattered normal-sized mediastinal lymph nodes. Respiratory motion artifacts at lung bases, degrading assessment. No definite pulmonary emboli identified.  Dependent atelectasis right greater than left lower lobes. Tubular intermediate to low attenuation structure in the left lower lobe is again identified, not demonstrating significant vascular enhancement to suggest a pulmonary vascular structure, question mucus plug within a bronchiectatic airway. A similar finding is  seen in the left upper lobe image 32; both were present on the 2007 exam as well. Questionable 8 mm diameter left lower lobe nodular density on sagittal image 81 series 603 is shown to be a linear area of potential scarring on axial images. No additional pulmonary mass or nodule identified. No acute osseous findings.  IMPRESSION: No evidence of pulmonary embolism. Scattered atherosclerotic changes with minimal aneurysmal dilatation of ascending thoracic aorta 4.2 x 4.2 cm greatest size. Small hiatal hernia. Tubular/branching intermediate to low attenuation structure in left lower and left upper lobes, question mucus plugs, slightly increased in size since 2007 exam. Dependent  atelectasis in lower lobes right greater than left.   Original Report Authenticated By: Lollie Marrow, M.D.    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 Pulmonary and Critical Care Medicine [facility] Pager: [phone]  08/14/2012, 8:06 PM

## 2012-08-15 ENCOUNTER — Inpatient Hospital Stay (HOSPITAL_COMMUNITY): Payer: Medicare Other

## 2012-08-15 LAB — CARDIAC PANEL(CRET KIN+CKTOT+MB+TROPI)
CK, MB: 1.9 ng/mL (ref 0.3–4.0)
Relative Index: INVALID (ref 0.0–2.5)
Relative Index: INVALID (ref 0.0–2.5)
Troponin I: <0.3 ng/mL (ref <0.30)
Troponin I: <0.3 ng/mL (ref <0.30)

## 2012-08-15 LAB — C-REACTIVE PROTEIN: CRP: 6.5 mg/dL — ABNORMAL HIGH (ref <0.60)

## 2012-08-15 LAB — CORTISOL: Cortisol, Plasma: 17 ug/dL

## 2012-08-15 LAB — BASIC METABOLIC PANEL
CO2: 23 mEq/L (ref 19–32)
Calcium: 9.3 mg/dL (ref 8.4–10.5)
Glucose, Bld: 107 mg/dL — ABNORMAL HIGH (ref 70–99)
Sodium: 140 mEq/L (ref 135–145)

## 2012-08-15 LAB — CORTISOL-AM, BLOOD: Cortisol - AM: 7.4 ug/dL (ref 4.3–22.4)

## 2012-08-15 LAB — T4, FREE: Free T4: 1.58 ng/dL (ref 0.80–1.80)

## 2012-08-15 LAB — C4 COMPLEMENT: Complement C4, Body Fluid: 24 mg/dL (ref 10–40)

## 2012-08-15 LAB — TSH: TSH: 2.278 u[IU]/mL (ref 0.350–4.500)

## 2012-08-15 MED ORDER — PREDNISONE 5 MG PO TABS
5.0000 mg | ORAL_TABLET | Freq: Every day | ORAL | Status: DC
  Administered 2012-08-15 – 2012-08-16 (×2): 5 mg via ORAL
  Filled 2012-08-15 (×3): qty 1

## 2012-08-15 MED ORDER — ASPIRIN EC 325 MG PO TBEC
325.0000 mg | DELAYED_RELEASE_TABLET | Freq: Every day | ORAL | Status: DC
  Administered 2012-08-15 – 2012-08-16 (×2): 325 mg via ORAL
  Filled 2012-08-15 (×2): qty 1

## 2012-08-15 MED ORDER — ALBUTEROL SULFATE (5 MG/ML) 0.5% IN NEBU
2.5000 mg | INHALATION_SOLUTION | Freq: Once | RESPIRATORY_TRACT | Status: AC
  Administered 2012-08-15: 2.5 mg via RESPIRATORY_TRACT

## 2012-08-15 NOTE — Progress Notes (Signed)
UR Completed Raymie Trani Graves-Bigelow, RN,BSN [phone]  

## 2012-08-15 NOTE — Evaluation (Signed)
Physical Therapy Evaluation Patient Details Name: RJ PEDROSA MRN: [id number] DOB: 01-Jan-1946 Today's Date: 08/15/2012 Time: 1100-1110 PT Time Calculation (min): 10 min  PT Assessment / Plan / Recommendation Clinical Impression  pt presents with SOB.  pt moves Independently and indicates to this PT no increased SOB with activity.  pt very impatient and moves quickly.  No skilled PT needs at this time.  Will sign off.      PT Assessment  Patent does not need any further PT services    Follow Up Recommendations  No PT follow up    Barriers to Discharge        Equipment Recommendations  None recommended by PT    Recommendations for Other Services     Frequency      Precautions / Restrictions Precautions Precautions: None Restrictions Weight Bearing Restrictions: No   Pertinent Vitals/Pain Denies pain.        Mobility  Bed Mobility Bed Mobility: Supine to Sit;Sitting - Scoot to Edge of Bed Supine to Sit: 7: Independent Sitting - Scoot to Edge of Bed: 7: Independent Transfers Transfers: Sit to Stand;Stand to Sit Sit to Stand: 7: Independent;From bed Stand to Sit: 7: Independent;To bed Ambulation/Gait Ambulation/Gait Assistance: 7: Independent Ambulation Distance (Feet): 150 Feet Assistive device: None Ambulation/Gait Assistance Details: pt moving well, denies increased SOB with activity.  pt moves quickly.   Gait Pattern: Within Functional Limits Stairs: No Wheelchair Mobility Wheelchair Mobility: No    Exercises     PT Diagnosis:    PT Problem List:   PT Treatment Interventions:     PT Goals    Visit Information  Last PT Received On: 08/15/12 Assistance Needed: +1    Subjective Data  Subjective: I can move fine.   Patient Stated Goal: Back to work.     Prior Functioning  Home Living Lives With: Alone Type of Home: House Home Access: Level entry Home Layout: One level Prior Function Level of Independence: Independent Able to Take  Stairs?: Yes Driving: Yes Vocation: Full time employment Comments: pt owns a factory.   Communication Communication: No difficulties    Cognition  Overall Cognitive Status: Appears within functional limits for tasks assessed/performed Arousal/Alertness: Awake/alert Orientation Level: Oriented X4 / Intact Behavior During Session: [facility] for tasks performed    Extremity/Trunk Assessment Right Lower Extremity Assessment RLE ROM/Strength/Tone: Within functional levels RLE Sensation: WFL - Light Touch Left Lower Extremity Assessment LLE ROM/Strength/Tone: Within functional levels LLE Sensation: WFL - Light Touch Trunk Assessment Trunk Assessment: Normal   Balance Balance Balance Assessed: No  End of Session PT - End of Session Equipment Utilized During Treatment:  (None) Activity Tolerance: Patient tolerated treatment well Patient left: in bed;with call bell/phone within reach (Sitting EOB) Nurse Communication: Mobility status  GP     RitenourAlison Murray, PT [phone] 08/15/2012, 11:17 AM

## 2012-08-15 NOTE — Progress Notes (Signed)
Patient ID: [name]  male  ZOX:[id number]    DOB: [birth year]    DOA: 08/14/2012  PCP: Mickie Hillier, MD  Subjective: Feels somewhat better today, denies any shortness of breath or any chest pain, nausea and vomiting.  Objective: Weight change:   Intake/Output Summary (Last 24 hours) at 08/15/12 1312 Last data filed at 08/15/12 0900  Gross per 24 hour  Intake    950 ml  Output      0 ml  Net    950 ml   Blood pressure 121/64, pulse 63, temperature 98.4 F (36.9 C), temperature source Oral, resp. rate 18, weight 79.788 kg (175 lb 14.4 oz), SpO2 98.00%.  Physical Exam: General: Alert and awake, oriented x3, not in any acute distress. HEENT: anicteric sclera, pupils reactive to light and accommodation, EOMI CVS: S1-S2 clear, no murmur rubs or gallops Chest: clear to auscultation bilaterally, no wheezing, rales or rhonchi Abdomen: soft nontender, nondistended, normal bowel sounds, no organomegaly Extremities: no cyanosis, clubbing or edema noted bilaterally Neuro: Cranial nerves II-XII intact, no focal neurological deficits  Lab Results: Basic Metabolic Panel:  Lab 08/15/12 8119 08/14/12 1817 08/14/12 1336  NA 140 -- 136  K 3.7 -- 4.3  CL 104 -- 101  CO2 23 -- 22  GLUCOSE 107* -- 137*  BUN 16 -- 16  CREATININE 1.16 1.20 --  CALCIUM 9.3 -- 9.9  MG -- -- --  PHOS -- -- --   Liver Function Tests:  Lab 08/14/12 1336  AST 24  ALT 19  ALKPHOS 68  BILITOT 0.5  PROT 7.0  ALBUMIN 3.3*    Lab 08/14/12 1335  LIPASE 22  AMYLASE --   CBC:  Lab 08/14/12 1817 08/14/12 1335  WBC 10.0 11.2*  NEUTROABS -- --  HGB 12.4* 12.7*  HCT 34.9* 36.1*  MCV 91.6 92.8  PLT 270 255   Cardiac Enzymes:  Lab 08/15/12 0710 08/15/12 0003 08/14/12 1815  CKTOTAL 52 40 25  CKMB 1.9 1.6 1.3  CKMBINDEX -- -- --  TROPONINI <0.30 <0.30 <0.30   BNP: No components found with this basename: POCBNP:2 CBG: No results found for this basename: GLUCAP:5 in the last 168  hours   Micro Results: No results found for this or any previous visit (from the past 240 hour(s)).  Studies/Results: Dg Chest 2 View  08/14/2012  *RADIOLOGY REPORT*  Clinical Data: Short of breath.  Hypertension.  CHEST - 2 VIEW  Comparison: 08/09/2012 and multiple previous  Findings: Heart size is normal.  The mediastinum is unremarkable except for unfolding of the thoracic aorta.  Lungs are hyperinflated with scarring consistent with chronic emphysema. Chronically seen 9 mm nodule in the left mid to lower lung, variably demonstrated on prior studies.  No new or progressive disease identified.  IMPRESSION: Chronic lung disease with hyperinflation and pulmonary scarring. Chronic nodular shadow left mid lung.  No active process identified.   Original Report Authenticated By: Thomasenia Sales, M.D.    Ct Angio Chest W/cm &/or Wo Cm  08/14/2012  *RADIOLOGY REPORT*  Clinical Data: Shortness of breath, elevated d-dimer, weakness, headache, neck pain, worsening symptoms over 1 week  CT ANGIOGRAPHY CHEST  Technique:  Multidetector CT imaging of the chest using the standard protocol during bolus administration of intravenous contrast. Multiplanar reconstructed images including MIPs were obtained and reviewed to evaluate the vascular anatomy.  Contrast: OMNIPAQUE IOHEXOL 350 MG/ML SOLN  Comparison: 08/23/2006  Findings: Scattered atherosclerotic calcifications aorta and coronary arteries. Minimal aneurysmal dilatation  ascending thoracic aorta 4.2 x 4.2 cm image 54. No gross evidence of aortic dissection. Small hiatal hernia. Visualized portion of upper abdomen otherwise unremarkable. Scattered normal-sized mediastinal lymph nodes. Respiratory motion artifacts at lung bases, degrading assessment. No definite pulmonary emboli identified.  Dependent atelectasis right greater than left lower lobes. Tubular intermediate to low attenuation structure in the left lower lobe is again identified, not demonstrating  significant vascular enhancement to suggest a pulmonary vascular structure, question mucus plug within a bronchiectatic airway. A similar finding is seen in the left upper lobe image 32; both were present on the 2007 exam as well. Questionable 8 mm diameter left lower lobe nodular density on sagittal image 81 series 603 is shown to be a linear area of potential scarring on axial images. No additional pulmonary mass or nodule identified. No acute osseous findings.  IMPRESSION: No evidence of pulmonary embolism. Scattered atherosclerotic changes with minimal aneurysmal dilatation of ascending thoracic aorta 4.2 x 4.2 cm greatest size. Small hiatal hernia. Tubular/branching intermediate to low attenuation structure in left lower and left upper lobes, question mucus plugs, slightly increased in size since 2007 exam. Dependent atelectasis in lower lobes right greater than left.   Original Report Authenticated By: Lollie Marrow, M.D.     Medications: Scheduled Meds:   . albuterol  2.5 mg Nebulization BID  . aspirin EC  325 mg Oral Daily  . azelastine  1 spray Each Nare BID  . enoxaparin  40 mg Subcutaneous Q24H  . fluticasone  2 spray Each Nare Daily  . ipratropium  0.5 mg Nebulization BID  . irbesartan  300 mg Oral Daily  . pantoprazole  80 mg Oral BID AC  . predniSONE  5 mg Oral Q breakfast  . sodium chloride  1,000 mL Intravenous Once  . sodium chloride  3 mL Intravenous Q12H  . DISCONTD: albuterol  2.5 mg Nebulization Q6H  . DISCONTD: atorvastatin  20 mg Oral Daily  . DISCONTD: furosemide  40 mg Intravenous BID  . DISCONTD: ipratropium  0.5 mg Nebulization Q6H  . DISCONTD: predniSONE  60 mg Oral Q breakfast  . DISCONTD: predniSONE  60 mg Oral Q breakfast  . DISCONTD: sodium chloride  1,000 mL Intravenous Once  . DISCONTD: tadalafil  5 mg Oral Daily   Continuous Infusions:    Assessment/Plan: Principal Problem:  *Dyspnea with increasing generalized weakness: Unclear etiology, no evidence  of hypoxia, pulmonary embolism ruled out - 2-D echocardiogram pending. Full PFTs per pulmonology appreciate recommendations - CK is normal, no rhabdomyolysis, autoimmune workup ordered by pulmonology - May need a muscle biopsy or if pulmonary workup is nondiagnostic, need cardiopulmonary exercise test ? Stress test  Active Problems:  Rheumatoid arthritis:  - Continue steroids, patient was on methotrexate and abatacept. ? RA related lung disease versus methotrexate/orencia related worsening generalized weakness and myopathy. I attempted to contact patient's rheumatologist, Dr. Wonda Cerise (rheumatology [facility]), unfortunately Dr. Satira Sark. Sherron Monday is out of office 08/22/12. I made him an appointment with Dr. Alene Mires or the nurse practitioner on 08/23/12 at 11 AM for followup at [facility] rheumatology office.   HTN (hypertension): Stable   GERD (gastroesophageal reflux disease): Continue PPI  DVT Prophylaxis: Lovenox  Code Status: Full code  Disposition: Hopefully tomorrow   LOS: 1 day   Doriann Zuch M.D. [facility] 08/15/2012, 1:12 PM Pager: [phone]  If 7PM-7AM, please contact night-coverage [email or web address] Password [email or web address]

## 2012-08-15 NOTE — Care Management Note (Signed)
    Page 1 of 1   08/16/2012     2:05:38 PM   CARE MANAGEMENT NOTE 08/16/2012  Patient:  [name], [name]   Account Number:  [id number]  Date Initiated:  08/15/2012  Documentation initiated by:  GRAVES-BIGELOW,Finnegan Gatta  Subjective/Objective Assessment:   Pt admitted with dyspnea.     Action/Plan:   CM will continue to monitor  for disposition needs.   Anticipated DC Date:  08/17/2012   Anticipated DC Plan:  HOME W HOME HEALTH SERVICES      DC Planning Services  CM consult  Patient refused services      Choice offered to / List presented to:             Status of service:  Completed, signed off Medicare Important Message given?   (If response is "NO", the following Medicare IM given date fields will be blank) Date Medicare IM given:   Date Additional Medicare IM given:    Discharge Disposition:  HOME/SELF CARE  Per UR Regulation:  Reviewed for med. necessity/level of care/duration of stay  If discussed at Long Length of Stay Meetings, dates discussed:    Comments:  08-16-12 1403 Tomi Bamberger, RN, BSN [phone] CM spoke to pt and he seems pretty independent. Pt ambulating around halls. No services needed for home at this time.

## 2012-08-15 NOTE — Progress Notes (Signed)
  Echocardiogram 2D Echocardiogram has been performed.  [name] 08/15/2012, 4:13 PM

## 2012-08-15 NOTE — Progress Notes (Signed)
Name: [name] MRN: [id number] DOB: 21-May-1946    LOS: 1  Referring Provider:  Thad Ranger Reason for Referral:  Dyspnea.  PULMONARY / CRITICAL CARE MEDICINE  HPI:  66 yo male former smoker admitted 08/14/2012 with progressive dyspnea and weakness for one month.  PMHx of RA (on chronic prednisone, MTX, abatacept), Rheumatoid pulmonary nodules, severe GERD, HTN  Events Since Admission: 8/19 CT chest>>ascending thoracic aorta 4.2 x 4.2 cm, small HH, b/l lower ATX, LLL BTX, 8 mm LLL nodule  Current Status: Breathing better.  Anxious to go home.  Denies cough, wheeze, or chest pain.  He reports feeling more fatigue rather than weakness.  Reports that he exercises regularly w/o difficulty in the gym, including lifting weights.  Vital Signs: Temp:  [98.4 F (36.9 C)-98.8 F (37.1 C)] 98.4 F (36.9 C) (08/20 0556) Pulse Rate:  [61-67] 63  (08/20 0556) Resp:  [16-24] 18  (08/20 0556) BP: (115-133)/(56-68) 121/64 mmHg (08/20 0556) SpO2:  [98 %-100 %] 98 % (08/20 0556) Weight:  [175 lb 14.4 oz (79.788 kg)] 175 lb 14.4 oz (79.788 kg) (08/20 0556)  Physical Examination: General:  No distress Neuro:  Normal strength HEENT:  No sinus tenderness Neck:  supple Cardiovascular:  s1s2 regular, no murmur Lungs:  Good air entry, no wheeze/rales Abdomen:  Soft, non tender Musculoskeletal:  No edema, changes of RA involving MCP joints Skin:  No rashes  Lab Results  Component Value Date   WBC 10.0 08/14/2012   HGB 12.4* 08/14/2012   HCT 34.9* 08/14/2012   MCV 91.6 08/14/2012   PLT 270 08/14/2012   Lab Results  Component Value Date   CREATININE 1.16 08/15/2012   BUN 16 08/15/2012   NA 140 08/15/2012   K 3.7 08/15/2012   CL 104 08/15/2012   CO2 23 08/15/2012   Lab Results  Component Value Date   ALT 19 08/14/2012   AST 24 08/14/2012   ALKPHOS 68 08/14/2012   BILITOT 0.5 08/14/2012    ABG    Component Value Date/Time   PHART 7.476* 08/14/2012 2141   PCO2ART 25.6* 08/14/2012 2141     PO2ART 101.0* 08/14/2012 2141   HCO3 18.7* 08/14/2012 2141   TCO2 19.4 08/14/2012 2141   ACIDBASEDEF 4.3* 08/14/2012 2141   O2SAT 97.5 08/14/2012 2141       Component Value Date/Time   PROBNP 407.3* 08/14/2012 1336   Lab Results  Component Value Date   ESRSEDRATE 97* 08/14/2012   Lab Results  Component Value Date   RF 88* 08/14/2012   Lab Results  Component Value Date   TSH 2.278 08/14/2012   Cardiac Panel (last 3 results)  Basename 08/15/12 0710 08/15/12 0003 08/14/12 1815  CKTOTAL 52 40 25  CKMB 1.9 1.6 1.3  TROPONINI <0.30 <0.30 <0.30  RELINDX RELATIVE INDEX IS INVALID RELATIVE INDEX IS INVALID RELATIVE INDEX IS INVALID    Dg Chest 2 View  08/14/2012  *RADIOLOGY REPORT*  Clinical Data: Short of breath.  Hypertension.  CHEST - 2 VIEW  Comparison: 08/09/2012 and multiple previous  Findings: Heart size is normal.  The mediastinum is unremarkable except for unfolding of the thoracic aorta.  Lungs are hyperinflated with scarring consistent with chronic emphysema. Chronically seen 9 mm nodule in the left mid to lower lung, variably demonstrated on prior studies.  No new or progressive disease identified.  IMPRESSION: Chronic lung disease with hyperinflation and pulmonary scarring. Chronic nodular shadow left mid lung.  No active process identified.   Original Report  Authenticated By: Thomasenia Sales, M.D.    Ct Angio Chest W/cm &/or Wo Cm  08/14/2012  *RADIOLOGY REPORT*  Clinical Data: Shortness of breath, elevated d-dimer, weakness, headache, neck pain, worsening symptoms over 1 week  CT ANGIOGRAPHY CHEST  Technique:  Multidetector CT imaging of the chest using the standard protocol during bolus administration of intravenous contrast. Multiplanar reconstructed images including MIPs were obtained and reviewed to evaluate the vascular anatomy.  Contrast: OMNIPAQUE IOHEXOL 350 MG/ML SOLN  Comparison: 08/23/2006  Findings: Scattered atherosclerotic calcifications aorta and coronary  arteries. Minimal aneurysmal dilatation ascending thoracic aorta 4.2 x 4.2 cm image 54. No gross evidence of aortic dissection. Small hiatal hernia. Visualized portion of upper abdomen otherwise unremarkable. Scattered normal-sized mediastinal lymph nodes. Respiratory motion artifacts at lung bases, degrading assessment. No definite pulmonary emboli identified.  Dependent atelectasis right greater than left lower lobes. Tubular intermediate to low attenuation structure in the left lower lobe is again identified, not demonstrating significant vascular enhancement to suggest a pulmonary vascular structure, question mucus plug within a bronchiectatic airway. A similar finding is seen in the left upper lobe image 32; both were present on the 2007 exam as well. Questionable 8 mm diameter left lower lobe nodular density on sagittal image 81 series 603 is shown to be a linear area of potential scarring on axial images. No additional pulmonary mass or nodule identified. No acute osseous findings.  IMPRESSION: No evidence of pulmonary embolism. Scattered atherosclerotic changes with minimal aneurysmal dilatation of ascending thoracic aorta 4.2 x 4.2 cm greatest size. Small hiatal hernia. Tubular/branching intermediate to low attenuation structure in left lower and left upper lobes, question mucus plugs, slightly increased in size since 2007 exam. Dependent atelectasis in lower lobes right greater than left.   Original Report Authenticated By: Lollie Marrow, M.D.       ASSESSMENT AND PLAN  A:  Progressive dyspnea in former smoker with hx of RA.  Has focal BTX on CT chest.  CT chest relative unremarkable. P:   F/u PFT F/u Echo  A: Pulmonary nodule with hx of rheumatoid nodule. P:  Will need to compare to prior chest imaging studies>>not acute issue and can be done as outpt  A: ?muscle weakness contributing to dyspnea>>not evident on clinical exam; CPK not elevated. P:   Hold lipitor Check MIP/MEP on  PFT Rest per primary team  A: Rheumatoid arthritis>>followed by rheumatology at [facility].  ESR and RF elevated.  ?if some of his symptoms are related to fatigue from RA. P: He has outpt f/u with rheumatology scheduled by hospitalist  Await results of PFT and Echo.  Explained to pt that these can be done as outpt if not able to be completed today.     Coralyn Helling, MD [facility] Pulmonary/Critical Care 08/15/2012, 1:54 PM Pager:  [phone] After 3pm call: [phone]

## 2012-08-16 LAB — BASIC METABOLIC PANEL
BUN: 13 mg/dL (ref 6–23)
Chloride: 105 mEq/L (ref 96–112)
Glucose, Bld: 120 mg/dL — ABNORMAL HIGH (ref 70–99)
Potassium: 4.1 mEq/L (ref 3.5–5.1)

## 2012-08-16 NOTE — Progress Notes (Signed)
Name: [name] MRN: [id number] DOB: 02/26/1946    LOS: 2  Referring Provider:  Thad Ranger Reason for Referral:  Dyspnea.  PULMONARY / CRITICAL CARE MEDICINE  HPI:  66 yo male former smoker admitted 08/14/2012 with progressive dyspnea and weakness for one month.  PMHx of RA (on chronic prednisone, MTX, abatacept), Rheumatoid pulmonary nodules, severe GERD, HTN  Events Since Admission: 8/19 CT chest>>ascending thoracic aorta 4.2 x 4.2 cm, small HH, b/l lower ATX, LLL BTX, 8 mm LLL nodule 8/20 PFT>>FEV1 3.73 (102%), FEV1% 76, TLC 7.64 (102%), DLCO 76%, no bronchodilator response.  MIP/MEP not done.  Current Status: Walked multiple times in hallway without difficulty.  Denies chest pain, cough, or wheeze.  Vital Signs: Temp:  [97.8 F (36.6 C)-98 F (36.7 C)] 97.8 F (36.6 C) (08/21 0500) Pulse Rate:  [74] 74  (08/21 0500) Resp:  [18] 18  (08/21 0500) BP: (124-126)/(68) 124/68 mmHg (08/21 0500) SpO2:  [98 %-100 %] 99 % (08/21 0857)  Physical Examination: General:  No distress Neuro:  Normal strength HEENT:  No sinus tenderness Neck:  supple Cardiovascular:  s1s2 regular, no murmur Lungs:  Good air entry, no wheeze/rales Abdomen:  Soft, non tender Musculoskeletal:  No edema, changes of RA involving MCP joints Skin:  No rashes  Lab Results  Component Value Date   WBC 10.0 08/14/2012   HGB 12.4* 08/14/2012   HCT 34.9* 08/14/2012   MCV 91.6 08/14/2012   PLT 270 08/14/2012   Lab Results  Component Value Date   CREATININE 1.05 08/16/2012   BUN 13 08/16/2012   NA 139 08/16/2012   K 4.1 08/16/2012   CL 105 08/16/2012   CO2 25 08/16/2012   Lab Results  Component Value Date   ALT 19 08/14/2012   AST 24 08/14/2012   ALKPHOS 68 08/14/2012   BILITOT 0.5 08/14/2012    ABG    Component Value Date/Time   PHART 7.476* 08/14/2012 2141   PCO2ART 25.6* 08/14/2012 2141   PO2ART 101.0* 08/14/2012 2141   HCO3 18.7* 08/14/2012 2141   TCO2 19.4 08/14/2012 2141   ACIDBASEDEF 4.3*  08/14/2012 2141   O2SAT 97.5 08/14/2012 2141       Component Value Date/Time   PROBNP 407.3* 08/14/2012 1336   Lab Results  Component Value Date   ESRSEDRATE 97* 08/14/2012   Lab Results  Component Value Date   RF 88* 08/14/2012   Lab Results  Component Value Date   TSH 2.278 08/14/2012   Cardiac Panel (last 3 results)  Basename 08/15/12 0710 08/15/12 0003 08/14/12 1815  CKTOTAL 52 40 25  CKMB 1.9 1.6 1.3  TROPONINI <0.30 <0.30 <0.30  RELINDX RELATIVE INDEX IS INVALID RELATIVE INDEX IS INVALID RELATIVE INDEX IS INVALID    Dg Chest 2 View  08/14/2012  *RADIOLOGY REPORT*  Clinical Data: Short of breath.  Hypertension.  CHEST - 2 VIEW  Comparison: 08/09/2012 and multiple previous  Findings: Heart size is normal.  The mediastinum is unremarkable except for unfolding of the thoracic aorta.  Lungs are hyperinflated with scarring consistent with chronic emphysema. Chronically seen 9 mm nodule in the left mid to lower lung, variably demonstrated on prior studies.  No new or progressive disease identified.  IMPRESSION: Chronic lung disease with hyperinflation and pulmonary scarring. Chronic nodular shadow left mid lung.  No active process identified.   Original Report Authenticated By: Thomasenia Sales, M.D.    Ct Angio Chest W/cm &/or Wo Cm  08/14/2012  *RADIOLOGY REPORT*  Clinical Data: Shortness of breath, elevated d-dimer, weakness, headache, neck pain, worsening symptoms over 1 week  CT ANGIOGRAPHY CHEST  Technique:  Multidetector CT imaging of the chest using the standard protocol during bolus administration of intravenous contrast. Multiplanar reconstructed images including MIPs were obtained and reviewed to evaluate the vascular anatomy.  Contrast: OMNIPAQUE IOHEXOL 350 MG/ML SOLN  Comparison: 08/23/2006  Findings: Scattered atherosclerotic calcifications aorta and coronary arteries. Minimal aneurysmal dilatation ascending thoracic aorta 4.2 x 4.2 cm image 54. No gross evidence of  aortic dissection. Small hiatal hernia. Visualized portion of upper abdomen otherwise unremarkable. Scattered normal-sized mediastinal lymph nodes. Respiratory motion artifacts at lung bases, degrading assessment. No definite pulmonary emboli identified.  Dependent atelectasis right greater than left lower lobes. Tubular intermediate to low attenuation structure in the left lower lobe is again identified, not demonstrating significant vascular enhancement to suggest a pulmonary vascular structure, question mucus plug within a bronchiectatic airway. A similar finding is seen in the left upper lobe image 32; both were present on the 2007 exam as well. Questionable 8 mm diameter left lower lobe nodular density on sagittal image 81 series 603 is shown to be a linear area of potential scarring on axial images. No additional pulmonary mass or nodule identified. No acute osseous findings.  IMPRESSION: No evidence of pulmonary embolism. Scattered atherosclerotic changes with minimal aneurysmal dilatation of ascending thoracic aorta 4.2 x 4.2 cm greatest size. Small hiatal hernia. Tubular/branching intermediate to low attenuation structure in left lower and left upper lobes, question mucus plugs, slightly increased in size since 2007 exam. Dependent atelectasis in lower lobes right greater than left.   Original Report Authenticated By: Lollie Marrow, M.D.       ASSESSMENT AND PLAN  A:  Dyspnea in former smoker with hx of RA.  Has focal BTX on CT chest.  CT chest relative unremarkable otherwise.  PFT's normal except for very mild diffusion defect.  ?if his dyspnea is actually related to fatigue from RA. P:   MIP/MEP not done with PFT's>>no clinical evidence for muscle weakness, and remainder of PFT's relatively unremarkable so will defer MIP/MEP No further inpt pulmonary testing needed Hospitalist will f/u Echo results  A: Pulmonary nodule with hx of rheumatoid nodule. P:  Will need to compare to prior chest  imaging studies>>not acute issue and can be done as outpt  A: ?muscle weakness contributing to dyspnea>>not evident on clinical exam; CPK not elevated. P:   Defer decision to resume lipitor to primary team  A: Rheumatoid arthritis>>followed by rheumatology at [facility].  ESR and RF elevated.  ?if some of his symptoms are related to fatigue from RA. P: He has outpt f/u with rheumatology scheduled by hospitalist  No additional pulmonary follow up needed.  Pulmonary will sign off.  Please call if further help needed.   Coralyn Helling, MD [facility] Pulmonary/Critical Care 08/16/2012, 12:10 PM Pager:  [phone] After 3pm call: [phone]

## 2012-08-16 NOTE — Discharge Summary (Signed)
Physician Discharge Summary  [name]:[id number] DOB: [birth year] DOA: 08/14/2012  PCP: Mickie Hillier, MD  Admit date: 08/14/2012 Discharge date: 08/16/2012  Recommendations for Outpatient Follow-up:  1. Follow up with your primary care physician in 1 week. 2. Followup with rheumatology as scheduled.  Discharge Diagnoses:  Principal Problem:  *Dyspnea Active Problems:  Rheumatoid arthritis  HTN (hypertension)  GERD (gastroesophageal reflux disease)  Emphysema   Discharge Condition: Stable  Diet recommendation: Heart healthy diet  Filed Weights   08/15/12 0556  Weight: 79.788 kg (175 lb 14.4 oz)    History of present illness:  66 year old male with history of moderate to severe rheumatoid arthritis, hypertension, and GERD presented on 08/14/2012 with complaints of dyspnea and progressive weakness over the last month.  Hospital Course:  Dyspnea with increasing generalized weakness Upon further questioning, patient reports that he has had poor appetite and lack of sleep.  Suspect patient is fatigued rather than short of breath.  Discussed proper diet and proper sleep hygiene.  2-D echocardiogram done with results as indicated below.  Pulmonary function tests unremarkable/normal, per pulmonary no further workup needed.  CT of the chest negative for pulmonary embolism. CK is normal, no rhabdomyolysis.  ESR and CRP elevated likely due to rheumatoid arthritis.  Likely will need further workup as outpatient if patient has persistent complaints of shortness of breath rather than fatigue.  Rheumatoid arthritis Continue steroids, methotrexate and abatacept. Question RA related lung disease versus methotrexate/orencia related worsening generalized weakness and myopathy.  Patient to followup with rheumatology at [facility] as scheduled by Dr. Isidoro Donning, with Dr. Wonda Cerise (rheumatology [facility]) or the nurse practitioner on 08/23/12 at 11 AM.   HTN  (hypertension) Stable   GERD (gastroesophageal reflux disease) Continue PPI   Procedures:  As indicated below.  Consultations:  Dr. Craige Cotta, pulmonary.  Discharge Exam: Filed Vitals:   08/16/12 1254  BP: 119/76  Pulse: 75  Temp: 98.5 F (36.9 C)  Resp: 18   Filed Vitals:   08/15/12 2100 08/16/12 0500 08/16/12 0857 08/16/12 1254  BP: 126/68 124/68  119/76  Pulse: 74 74  75  Temp: 98 F (36.7 C) 97.8 F (36.6 C)  98.5 F (36.9 C)  TempSrc: Oral Oral  Oral  Resp: 18 18  18   Weight:      SpO2: 98% 100% 99% 96%    Discharge Instructions  Discharge Orders    Future Orders Please Complete By Expires   Diet - low sodium heart healthy      Increase activity slowly      Discharge instructions      Comments:   Followup with Mickie Hillier, MD (PCP) in 1 week.  Followup with your rheumatologist at [facility] as scheduled.     Medication List  As of 08/16/2012  2:31 PM   TAKE these medications         abatacept 250 MG injection   Commonly known as: ORENCIA   Inject into the vein.      atorvastatin 20 MG tablet   Commonly known as: LIPITOR   Take 20 mg by mouth daily.      azelastine 137 MCG/SPRAY nasal spray   Commonly known as: ASTELIN   Place 1 spray into the nose 2 (two) times daily. Use in each nostril as directed      esomeprazole 40 MG capsule   Commonly known as: NEXIUM   Take 40 mg by mouth daily before breakfast.  fluticasone 50 MCG/ACT nasal spray   Commonly known as: FLONASE   Place 2 sprays into the nose daily.      irbesartan 300 MG tablet   Commonly known as: AVAPRO   Take 300 mg by mouth daily.      methotrexate 2.5 MG tablet   Commonly known as: RHEUMATREX   Take 12.5 mg by mouth once a week. Caution:Chemotherapy. Protect from light.; takes 5 tablets (12.5mg ) weekly on Mondays      omeprazole 40 MG capsule   Commonly known as: PRILOSEC   Take 40 mg by mouth daily.      predniSONE 5 MG tablet   Commonly known as: DELTASONE   Take 5  mg by mouth daily.      tadalafil 5 MG tablet   Commonly known as: CIALIS   Take 5 mg by mouth daily.           Follow-up Information    Follow up with Wonda Cerise, MD Rheumatology, [facility] on 08/23/2012. (at 11:00AM)       Follow up with Mickie Hillier, MD. Schedule an appointment as soon as possible for a visit in 1 week.   Contact information:   [address] [place] [address] [phone]           The results of significant diagnostics from this hospitalization (including imaging, microbiology, ancillary and laboratory) are listed below for reference.    Significant Diagnostic Studies: Dg Chest 2 View  08/14/2012  *RADIOLOGY REPORT*  Clinical Data: Short of breath.  Hypertension.  CHEST - 2 VIEW  Comparison: 08/09/2012 and multiple previous  Findings: Heart size is normal.  The mediastinum is unremarkable except for unfolding of the thoracic aorta.  Lungs are hyperinflated with scarring consistent with chronic emphysema. Chronically seen 9 mm nodule in the left mid to lower lung, variably demonstrated on prior studies.  No new or progressive disease identified.  IMPRESSION: Chronic lung disease with hyperinflation and pulmonary scarring. Chronic nodular shadow left mid lung.  No active process identified.   Original Report Authenticated By: Thomasenia Sales, M.D.    Ct Angio Chest W/cm &/or Wo Cm  08/14/2012  *RADIOLOGY REPORT*  Clinical Data: Shortness of breath, elevated d-dimer, weakness, headache, neck pain, worsening symptoms over 1 week  CT ANGIOGRAPHY CHEST  Technique:  Multidetector CT imaging of the chest using the standard protocol during bolus administration of intravenous contrast. Multiplanar reconstructed images including MIPs were obtained and reviewed to evaluate the vascular anatomy.  Contrast: OMNIPAQUE IOHEXOL 350 MG/ML SOLN  Comparison: 08/23/2006  Findings: Scattered atherosclerotic calcifications aorta and coronary  arteries. Minimal aneurysmal dilatation ascending thoracic aorta 4.2 x 4.2 cm image 54. No gross evidence of aortic dissection. Small hiatal hernia. Visualized portion of upper abdomen otherwise unremarkable. Scattered normal-sized mediastinal lymph nodes. Respiratory motion artifacts at lung bases, degrading assessment. No definite pulmonary emboli identified.  Dependent atelectasis right greater than left lower lobes. Tubular intermediate to low attenuation structure in the left lower lobe is again identified, not demonstrating significant vascular enhancement to suggest a pulmonary vascular structure, question mucus plug within a bronchiectatic airway. A similar finding is seen in the left upper lobe image 32; both were present on the 2007 exam as well. Questionable 8 mm diameter left lower lobe nodular density on sagittal image 81 series 603 is shown to be a linear area of potential scarring on axial images. No additional pulmonary mass or nodule identified. No acute osseous  findings.  IMPRESSION: No evidence of pulmonary embolism. Scattered atherosclerotic changes with minimal aneurysmal dilatation of ascending thoracic aorta 4.2 x 4.2 cm greatest size. Small hiatal hernia. Tubular/branching intermediate to low attenuation structure in left lower and left upper lobes, question mucus plugs, slightly increased in size since 2007 exam. Dependent atelectasis in lower lobes right greater than left.   Original Report Authenticated By: Lollie Marrow, M.D.    2-D echocardiogram on 08/15/2012  Study Conclusions - Left ventricle: The cavity size was normal. Wall thickness was normal. Systolic function was vigorous. The estimated ejection fraction was in the range of 65% to 70%. Wall motion was normal; there were no regional wall motion abnormalities. Left ventricular diastolic function parameters were normal. - Left atrium: The atrium was normal in size. - Right ventricle: The cavity size was normal. Wall thickness  was normal. The moderator band was prominent. Systolic function was normal. - Right atrium: The atrium was mildly dilated. - Inferior vena cava: The vessel was normal in size; the respirophasic diameter changes were in the normal range (=50%); findings are consistent with normal central venous pressure. - Pericardium, extracardiac: There was no pericardial effusion.   Microbiology: No results found for this or any previous visit (from the past 240 hour(s)).   Labs: Basic Metabolic Panel:  Lab 08/16/12 1610 08/15/12 0710 08/14/12 1817 08/14/12 1336  NA 139 140 -- 136  K 4.1 3.7 -- 4.3  CL 105 104 -- 101  CO2 25 23 -- 22  GLUCOSE 120* 107* -- 137*  BUN 13 16 -- 16  CREATININE 1.05 1.16 1.20 1.14  CALCIUM 9.7 9.3 -- 9.9  MG -- -- -- --  PHOS -- -- -- --   Liver Function Tests:  Lab 08/14/12 1336  AST 24  ALT 19  ALKPHOS 68  BILITOT 0.5  PROT 7.0  ALBUMIN 3.3*    Lab 08/14/12 1335  LIPASE 22  AMYLASE --   No results found for this basename: AMMONIA:5 in the last 168 hours CBC:  Lab 08/14/12 1817 08/14/12 1335  WBC 10.0 11.2*  NEUTROABS -- --  HGB 12.4* 12.7*  HCT 34.9* 36.1*  MCV 91.6 92.8  PLT 270 255   Cardiac Enzymes:  Lab 08/15/12 0710 08/15/12 0003 08/14/12 1815 08/14/12 1336  CKTOTAL 52 40 25 --  CKMB 1.9 1.6 1.3 --  CKMBINDEX -- -- -- --  TROPONINI <0.30 <0.30 <0.30 <0.30   BNP: BNP (last 3 results)  Basename 08/14/12 1336  PROBNP 407.3*   CBG: No results found for this basename: GLUCAP:5 in the last 168 hours  Time coordinating discharge: 25 minutes  Signed:  Merton Wadlow A  [facility] 08/16/2012, 2:31 PM

## 2012-08-16 NOTE — Progress Notes (Signed)
DC orders received.  Patient stable with no S/S of distress.  Discharge and medication information reviewed with patient and questions answered. Patient DC home. [name]

## 2012-08-16 NOTE — Progress Notes (Signed)
Subjective: Feeling better today.  Wondering if he can go home today.  Objective: Vital signs in last 24 hours: Filed Vitals:   08/15/12 2100 08/16/12 0500 08/16/12 0857 08/16/12 1254  BP: 126/68 124/68  119/76  Pulse: 74 74  75  Temp: 98 F (36.7 C) 97.8 F (36.6 C)  98.5 F (36.9 C)  TempSrc: Oral Oral  Oral  Resp: 18 18  18   Weight:      SpO2: 98% 100% 99% 96%   Weight change:   Intake/Output Summary (Last 24 hours) at 08/16/12 1255 Last data filed at 08/15/12 1700  Gross per 24 hour  Intake    360 ml  Output      0 ml  Net    360 ml    Physical Exam: General: Awake, Oriented, No acute distress. HEENT: EOMI. Neck: Supple CV: S1 and S2 Lungs: Clear to ascultation bilaterally Abdomen: Soft, Nontender, Nondistended, +bowel sounds. Ext: Good pulses. Trace edema.  Lab Results: Basic Metabolic Panel:  Lab 08/16/12 4696 08/15/12 0710 08/14/12 1817 08/14/12 1336  NA 139 140 -- 136  K 4.1 3.7 -- 4.3  CL 105 104 -- 101  CO2 25 23 -- 22  GLUCOSE 120* 107* -- 137*  BUN 13 16 -- 16  CREATININE 1.05 1.16 1.20 1.14  CALCIUM 9.7 9.3 -- 9.9  MG -- -- -- --  PHOS -- -- -- --   Liver Function Tests:  Lab 08/14/12 1336  AST 24  ALT 19  ALKPHOS 68  BILITOT 0.5  PROT 7.0  ALBUMIN 3.3*    Lab 08/14/12 1335  LIPASE 22  AMYLASE --   No results found for this basename: AMMONIA:5 in the last 168 hours CBC:  Lab 08/14/12 1817 08/14/12 1335  WBC 10.0 11.2*  NEUTROABS -- --  HGB 12.4* 12.7*  HCT 34.9* 36.1*  MCV 91.6 92.8  PLT 270 255   Cardiac Enzymes:  Lab 08/15/12 0710 08/15/12 0003 08/14/12 1815 08/14/12 1336  CKTOTAL 52 40 25 --  CKMB 1.9 1.6 1.3 --  CKMBINDEX -- -- -- --  TROPONINI <0.30 <0.30 <0.30 <0.30   BNP (last 3 results)  Basename 08/14/12 1336  PROBNP 407.3*   CBG: No results found for this basename: GLUCAP:5 in the last 168 hours No results found for this basename: HGBA1C:5 in the last 72 hours Other Labs: No components found with  this basename: POCBNP:3  Lab 08/14/12 1335  DDIMER 2.61*   No results found for this basename: CHOL:2,HDL:2,LDLCALC:2,TRIG:2,CHOLHDL:2,LDLDIRECT:2 in the last 168 hours  Lab 08/14/12 1629  TSH 2.278  T4TOTAL --  T3FREE --  FREET4 1.58  THYROIDAB --   No results found for this basename: VITAMINB12:2,FOLATE:2,FERRITIN:2,TIBC:2,IRON:2,RETICCTPCT:2 in the last 168 hours  Micro Results: No results found for this or any previous visit (from the past 240 hour(s)).  Studies/Results: Dg Chest 2 View  08/14/2012  *RADIOLOGY REPORT*  Clinical Data: Short of breath.  Hypertension.  CHEST - 2 VIEW  Comparison: 08/09/2012 and multiple previous  Findings: Heart size is normal.  The mediastinum is unremarkable except for unfolding of the thoracic aorta.  Lungs are hyperinflated with scarring consistent with chronic emphysema. Chronically seen 9 mm nodule in the left mid to lower lung, variably demonstrated on prior studies.  No new or progressive disease identified.  IMPRESSION: Chronic lung disease with hyperinflation and pulmonary scarring. Chronic nodular shadow left mid lung.  No active process identified.   Original Report Authenticated By: Thomasenia Sales, M.D.  Ct Angio Chest W/cm &/or Wo Cm  08/14/2012  *RADIOLOGY REPORT*  Clinical Data: Shortness of breath, elevated d-dimer, weakness, headache, neck pain, worsening symptoms over 1 week  CT ANGIOGRAPHY CHEST  Technique:  Multidetector CT imaging of the chest using the standard protocol during bolus administration of intravenous contrast. Multiplanar reconstructed images including MIPs were obtained and reviewed to evaluate the vascular anatomy.  Contrast: OMNIPAQUE IOHEXOL 350 MG/ML SOLN  Comparison: 08/23/2006  Findings: Scattered atherosclerotic calcifications aorta and coronary arteries. Minimal aneurysmal dilatation ascending thoracic aorta 4.2 x 4.2 cm image 54. No gross evidence of aortic dissection. Small hiatal hernia. Visualized  portion of upper abdomen otherwise unremarkable. Scattered normal-sized mediastinal lymph nodes. Respiratory motion artifacts at lung bases, degrading assessment. No definite pulmonary emboli identified.  Dependent atelectasis right greater than left lower lobes. Tubular intermediate to low attenuation structure in the left lower lobe is again identified, not demonstrating significant vascular enhancement to suggest a pulmonary vascular structure, question mucus plug within a bronchiectatic airway. A similar finding is seen in the left upper lobe image 32; both were present on the 2007 exam as well. Questionable 8 mm diameter left lower lobe nodular density on sagittal image 81 series 603 is shown to be a linear area of potential scarring on axial images. No additional pulmonary mass or nodule identified. No acute osseous findings.  IMPRESSION: No evidence of pulmonary embolism. Scattered atherosclerotic changes with minimal aneurysmal dilatation of ascending thoracic aorta 4.2 x 4.2 cm greatest size. Small hiatal hernia. Tubular/branching intermediate to low attenuation structure in left lower and left upper lobes, question mucus plugs, slightly increased in size since 2007 exam. Dependent atelectasis in lower lobes right greater than left.   Original Report Authenticated By: Lollie Marrow, M.D.     Medications: I have reviewed the patient's current medications. Scheduled Meds:   . albuterol  2.5 mg Nebulization BID  . albuterol  2.5 mg Nebulization Once  . aspirin EC  325 mg Oral Daily  . azelastine  1 spray Each Nare BID  . enoxaparin  40 mg Subcutaneous Q24H  . fluticasone  2 spray Each Nare Daily  . ipratropium  0.5 mg Nebulization BID  . irbesartan  300 mg Oral Daily  . pantoprazole  80 mg Oral BID AC  . predniSONE  5 mg Oral Q breakfast  . sodium chloride  3 mL Intravenous Q12H   Continuous Infusions:  PRN Meds:.sodium chloride, acetaminophen, albuterol, ondansetron (ZOFRAN) IV, sodium  chloride  2-D echocardiogram on 08/15/2012 Study Conclusions - Left ventricle: The cavity size was normal. Wall thickness was normal. Systolic function was vigorous. The estimated ejection fraction was in the range of 65% to 70%. Wall motion was normal; there were no regional wall motion abnormalities. Left ventricular diastolic function parameters were normal. - Left atrium: The atrium was normal in size. - Right ventricle: The cavity size was normal. Wall thickness was normal. The moderator band was prominent. Systolic function was normal. - Right atrium: The atrium was mildly dilated. - Inferior vena cava: The vessel was normal in size; the respirophasic diameter changes were in the normal range (=50%); findings are consistent with normal central venous pressure. - Pericardium, extracardiac: There was no pericardial effusion.  Assessment/Plan: Dyspnea with increasing generalized weakness Upon further questioning, patient reports that he has had poor appetite and lack of sleep.  Suspect patient is fatigued rather than truly short of breath.  Discussed proper diet and proper sleep hygiene.  2-D echocardiogram done  with results as indicated above.  Pulmonary function tests unremarkable/normal, per pulmonary no further workup needed.  CK is normal, no rhabdomyolysis.  ESR and CRP elevated likely due to rheumatoid arthritis.  Likely will need further workup as outpatient if patient has persistent complaints of shortness of breath rather than fatigue.  Rheumatoid arthritis:  Continue steroids, patient was on methotrexate and abatacept. ? RA related lung disease versus methotrexate/orencia related worsening generalized weakness and myopathy.  Patient to followup with rheumatology at [facility] as scheduled by Dr. Isidoro Donning, with Dr. Wonda Cerise (rheumatology [facility]) or the nurse practitioner on 08/23/12 at 11 AM.   HTN (hypertension) Stable   GERD (gastroesophageal reflux  disease) Continue PPI   DVT Prophylaxis Lovenox   Code Status Full code    LOS: 2 days  Oshay Stranahan A, MD 08/16/2012, 12:55 PM

## 2012-08-23 DIAGNOSIS — Z79899 Other long term (current) drug therapy: Secondary | ICD-10-CM | POA: Insufficient documentation

## 2012-08-27 ENCOUNTER — Ambulatory Visit: Payer: Self-pay

## 2012-08-29 DIAGNOSIS — D039 Melanoma in situ, unspecified: Secondary | ICD-10-CM | POA: Insufficient documentation

## 2012-08-29 DIAGNOSIS — D229 Melanocytic nevi, unspecified: Secondary | ICD-10-CM | POA: Insufficient documentation

## 2012-09-26 ENCOUNTER — Ambulatory Visit: Payer: Self-pay

## 2012-10-27 ENCOUNTER — Ambulatory Visit: Payer: Self-pay

## 2012-11-13 DIAGNOSIS — I781 Nevus, non-neoplastic: Secondary | ICD-10-CM | POA: Insufficient documentation

## 2012-11-13 DIAGNOSIS — L814 Other melanin hyperpigmentation: Secondary | ICD-10-CM | POA: Insufficient documentation

## 2012-11-26 ENCOUNTER — Ambulatory Visit: Payer: Self-pay

## 2012-12-13 ENCOUNTER — Ambulatory Visit: Payer: Self-pay

## 2012-12-13 LAB — CBC WITH DIFFERENTIAL/PLATELET
Basophil #: 0.1 10*3/uL (ref 0.0–0.1)
Basophil %: 0.8 %
Eosinophil #: 0 10*3/uL (ref 0.0–0.7)
Eosinophil %: 0.2 %
HCT: 40.4 % (ref 40.0–52.0)
HGB: 13.7 g/dL (ref 13.0–18.0)
Lymphocyte #: 1.1 10*3/uL (ref 1.0–3.6)
Lymphocyte %: 11.3 %
MCH: 33.1 pg (ref 26.0–34.0)
MCHC: 34 g/dL (ref 32.0–36.0)
MCV: 98 fL (ref 80–100)
Monocyte #: 0.7 x10 3/mm (ref 0.2–1.0)
Monocyte %: 7.3 %
Neutrophil #: 7.7 10*3/uL — ABNORMAL HIGH (ref 1.4–6.5)
Neutrophil %: 80.4 %
Platelet: 173 10*3/uL (ref 150–440)
RBC: 4.14 10*6/uL — ABNORMAL LOW (ref 4.40–5.90)
RDW: 15.1 % — ABNORMAL HIGH (ref 11.5–14.5)
WBC: 9.6 10*3/uL (ref 3.8–10.6)

## 2012-12-13 LAB — CREATININE, SERUM
Creatinine: 1.37 mg/dL — ABNORMAL HIGH (ref 0.60–1.30)
EGFR (African American): >60
EGFR (Non-African Amer.): 53 — ABNORMAL LOW

## 2012-12-13 LAB — ALT: SGPT (ALT): 27 U/L (ref 12–78)

## 2012-12-13 LAB — SGOT (AST)(ARMC): SGOT(AST): 22 U/L (ref 15–37)

## 2012-12-27 ENCOUNTER — Ambulatory Visit: Payer: Self-pay

## 2012-12-27 ENCOUNTER — Ambulatory Visit: Payer: Self-pay | Admitting: Hematology and Oncology

## 2013-01-27 ENCOUNTER — Ambulatory Visit: Payer: Self-pay

## 2013-01-27 ENCOUNTER — Ambulatory Visit: Payer: Self-pay | Admitting: Oncology

## 2013-02-24 ENCOUNTER — Ambulatory Visit: Payer: Self-pay | Admitting: Oncology

## 2013-02-24 ENCOUNTER — Ambulatory Visit: Payer: Self-pay

## 2013-03-27 ENCOUNTER — Ambulatory Visit: Payer: Self-pay

## 2013-04-26 ENCOUNTER — Ambulatory Visit: Payer: Self-pay | Admitting: Oncology

## 2013-04-26 ENCOUNTER — Ambulatory Visit: Payer: Self-pay

## 2013-05-27 ENCOUNTER — Ambulatory Visit: Payer: Self-pay

## 2013-06-26 ENCOUNTER — Ambulatory Visit: Payer: Self-pay

## 2013-07-04 ENCOUNTER — Ambulatory Visit: Payer: Self-pay

## 2013-07-04 LAB — CBC WITH DIFFERENTIAL/PLATELET
Basophil #: 0 10*3/uL (ref 0.0–0.1)
Basophil %: 0.5 %
Eosinophil #: 0 10*3/uL (ref 0.0–0.7)
Eosinophil %: 0.3 %
HGB: 14.8 g/dL (ref 13.0–18.0)
Lymphocyte #: 0.8 10*3/uL — ABNORMAL LOW (ref 1.0–3.6)
Lymphocyte %: 8.4 %
MCH: 34 pg (ref 26.0–34.0)
MCV: 98 fL (ref 80–100)
Monocyte #: 0.4 x10 3/mm (ref 0.2–1.0)
Monocyte %: 4.9 %
Neutrophil #: 7.9 10*3/uL — ABNORMAL HIGH (ref 1.4–6.5)
Neutrophil %: 85.9 %
WBC: 9.2 10*3/uL (ref 3.8–10.6)

## 2013-07-04 LAB — SGOT (AST)(ARMC): SGOT(AST): 28 U/L (ref 15–37)

## 2013-07-04 LAB — CREATININE, SERUM: EGFR (Non-African Amer.): >60

## 2013-07-04 LAB — ALT: SGPT (ALT): 29 U/L (ref 12–78)

## 2013-07-27 ENCOUNTER — Ambulatory Visit: Payer: Self-pay | Admitting: Oncology

## 2013-07-31 ENCOUNTER — Encounter (INDEPENDENT_AMBULATORY_CARE_PROVIDER_SITE_OTHER): Payer: Self-pay | Admitting: Surgery

## 2013-07-31 ENCOUNTER — Ambulatory Visit (INDEPENDENT_AMBULATORY_CARE_PROVIDER_SITE_OTHER): Payer: Medicare Other | Admitting: Surgery

## 2013-07-31 VITALS — BP 128/68 | HR 68 | Temp 98.1°F | Resp 15 | Ht 74.0 in | Wt 200.4 lb

## 2013-07-31 DIAGNOSIS — K649 Unspecified hemorrhoids: Secondary | ICD-10-CM | POA: Insufficient documentation

## 2013-07-31 DIAGNOSIS — K611 Rectal abscess: Secondary | ICD-10-CM

## 2013-07-31 DIAGNOSIS — K612 Anorectal abscess: Secondary | ICD-10-CM

## 2013-07-31 NOTE — Patient Instructions (Signed)
Continue antibiotic and sitz baths See [place] again in a week or so Peri-Rectal Abscess Your caregiver has diagnosed you as having a peri-rectal abscess. This is an infected area near the rectum that is filled with pus. If the abscess is near the surface of the skin, your caregiver may open (incise) the area and drain the pus. HOME CARE INSTRUCTIONS   If your abscess was opened up and drained. A small piece of gauze may be placed in the opening so that it can drain. Do not remove the gauze unless directed by your caregiver.  A loose dressing may be placed over the abscess site. Change the dressing as often as necessary to keep it clean and dry.  After the drain is removed, the area may be washed with a gentle antiseptic (soap) four times per day.  A warm sitz bath, warm packs or heating pad may be used for pain relief, taking care not to burn yourself.  Return for a wound check in 1 day or as directed.  An "inflatable doughnut" may be used for sitting with added comfort. These can be purchased at a drugstore or medical supply house.  To reduce pain and straining with bowel movements, eat a high fiber diet with plenty of fruits and vegetables. Use stool softeners as recommended by your caregiver. This is especially important if narcotic type pain medications were prescribed as these may cause marked constipation.  Only take over-the-counter or prescription medicines for pain, discomfort, or fever as directed by your caregiver. SEEK IMMEDIATE MEDICAL CARE IF:   You have increasing pain that is not controlled by medication.  There is increased inflammation (redness), swelling, bleeding, or drainage from the area.  An oral temperature above 102 F (38.9 C) develops.  You develop chills or generalized malaise (feel lethargic or feel "washed out").  You develop any new symptoms (problems) you feel may be related to your present problem. Document Released: 12/10/2000 Document Revised:  03/06/2012 Document Reviewed: 12/10/2008 [facility] Patient Information 2014 [place], [place].

## 2013-07-31 NOTE — Progress Notes (Signed)
[name]       DOB: 1946-09-20           DATE: 07/31/2013       AVW:[id number]  CC:  Chief Complaint  Patient presents with  . Abscess    eval perirectal abcess/also hems    HPI: [name] is referred to the urgent office for a peri-anal abscess. It has spontaneously drained with a little improvement in symptoms, but continuing to drain. HGe was started on antibiotics. [name] has issues over the years with hemorrhoids. No fever,'chills.   PMH reviewed in Epic  EXAM: Vital signs: BP 128/68  Pulse 68  Temp(Src) 98.1 F (36.7 C) (Temporal)  Resp 15  Ht 6\' 2"  (1.88 m)  Wt 200 lb 6.4 oz (90.901 kg)  BMI 25.72 kg/m2  General: Patient alert, oriented, NAD  Rectal: [name] has a draining abscess at the left anterior area and a prolapsing hemorrhoid at the left lateral/posterior IMP: Perianal/rectal abscess, partially drained  PLAN: Recommend I&D. [name] agrees  Procedure NOte: The area ia anesthetized with 8 cc of 1%xylo with Epi, 10 minutes wiat and I%D done. There is a tract/cavity directly towards the rectum. Complete drainage done and 2X2 pack placed . [name] will continue antibiotics and sitzbats and F/U next week  Jaquaveon Bilal J 07/31/2013

## 2013-08-10 ENCOUNTER — Ambulatory Visit (INDEPENDENT_AMBULATORY_CARE_PROVIDER_SITE_OTHER): Payer: Medicare Other | Admitting: General Surgery

## 2013-08-10 ENCOUNTER — Encounter (INDEPENDENT_AMBULATORY_CARE_PROVIDER_SITE_OTHER): Payer: Self-pay | Admitting: General Surgery

## 2013-08-10 VITALS — BP 124/60 | HR 68 | Temp 98.6°F | Resp 14 | Ht 74.0 in | Wt 199.0 lb

## 2013-08-10 DIAGNOSIS — K644 Residual hemorrhoidal skin tags: Secondary | ICD-10-CM

## 2013-08-10 DIAGNOSIS — K625 Hemorrhage of anus and rectum: Secondary | ICD-10-CM

## 2013-08-10 NOTE — Progress Notes (Signed)
Chief Complaint  Patient presents with  . Follow-up    reck peri-rectal abs    HISTORY: [name] is a 67 y.o. male who presents to the office with rectal bleeding, although he is s/p abscess drainage by Dr Jamey Ripa 10 days ago.  Other symptoms include inability to get clean after BM's.  This had been occurring for years.  He has tried diet modifications in the past with little success.  Spicy foods makes the symptoms worse.   It is intermittent in nature.  His bowel habits are somewhat irregular and his bowel movements are varied in consistency.  He does have to strain quite often.  His fiber intake is minimal.  He thinks his last colonoscopy was 4-5 years ago.  It was normal and he is due again in 7yrs from his last scope.  His abscess is better.  His pain and drainage have subsided.      Past Medical History  Diagnosis Date  . Arthritis   . Hypertension   . Dyslipidemia   . GERD (gastroesophageal reflux disease)   . Melanoma       Past Surgical History  Procedure Laterality Date  . Colon surgery  2007    removed 18" of colon  . Leg surgery          Current Outpatient Prescriptions  Medication Sig Dispense Refill  . abatacept (ORENCIA) 250 MG injection Inject into the vein.      Marland Kitchen atorvastatin (LIPITOR) 20 MG tablet Take 20 mg by mouth daily.      Marland Kitchen azelastine (ASTELIN) 137 MCG/SPRAY nasal spray Place 1 spray into the nose 2 (two) times daily. Use in each nostril as directed      . diltiazem (DILACOR XR) 180 MG 24 hr capsule       . fluticasone (FLONASE) 50 MCG/ACT nasal spray Place 2 sprays into the nose daily.      . irbesartan (AVAPRO) 300 MG tablet Take 300 mg by mouth daily.      . methotrexate (RHEUMATREX) 2.5 MG tablet Take 12.5 mg by mouth once a week. Caution:Chemotherapy. Protect from light.; takes 5 tablets (12.5mg ) weekly on Mondays      . omeprazole (PRILOSEC) 40 MG capsule Take 40 mg by mouth daily.      . predniSONE (DELTASONE) 5 MG tablet Take 5 mg by mouth  daily.      . tadalafil (CIALIS) 5 MG tablet Take 5 mg by mouth daily.       No current facility-administered medications for this visit.      No Known Allergies    Family History  Problem Relation Age of Onset  . Heart disease Mother   . Heart disease Father   . Cancer Maternal Grandmother     History   Social History  . Marital Status: Divorced    Spouse Name: N/A    Number of Children: N/A  . Years of Education: N/A   Social History Main Topics  . Smoking status: Former Smoker    Quit date: 11/26/2005  . Smokeless tobacco: Never Used  . Alcohol Use: No  . Drug Use: No  . Sexual Activity: None   Other Topics Concern  . None   Social History Narrative  . None      REVIEW OF SYSTEMS - PERTINENT POSITIVES ONLY: Review of Systems - General ROS: negative for - chills, fever or weight loss Hematological and Lymphatic ROS: negative for - bleeding problems, blood clots or  bruising Respiratory ROS: no cough, shortness of breath, or wheezing Cardiovascular ROS: no chest pain or dyspnea on exertion Gastrointestinal ROS: positive for - blood in stools negative for - abdominal pain Genito-Urinary ROS: no dysuria, trouble voiding, or hematuria  EXAM: Filed Vitals:   08/10/13 1421  BP: 124/60  Pulse: 68  Temp: 98.6 F (37 C)  Resp: 14    General appearance: alert and cooperative Resp: clear to auscultation bilaterally Cardio: regular rate and rhythm GI: soft, non-tender; bowel sounds normal; no masses,  no organomegaly   Procedure: Anoscopy Surgeon: Maisie Fus Diagnosis: rectal bleeding  Assistant: Christella Scheuermann After the risks and benefits were explained, verbal consent was obtained for above procedure  Anesthesia: none Findings: Grade 1 internal hemorrhoids, significant external skin tags, abscess wound noted, no drainage    ASSESSMENT AND PLAN: [name] is a 66 y.o. M with significant external tissue from hemorrhoids.  Internally, he has some visible  vessels internally but no inflammation or prolapse noted.  I recommended stopping his aspirin, if possible, and starting a fiber supplement.  After discussing excisional hemorrhoidectomy, he is not interested in that at this time.  I will see him back as needed.   His abscess site is healing well.    Vanita Panda, MD Colon and Rectal Surgery / General Surgery [facility]      Visit Diagnoses: 1. Hemorrhoidal skin tags   2. Rectal bleeding     Primary Care Physician: Mickie Hillier, MD

## 2013-08-10 NOTE — Patient Instructions (Signed)
HEMORRHOIDS    Did you know... Hemorrhoids are one of the most common ailments known.  More than half the population will develop hemorrhoids, usually after age 68.  Millions of Americans currently suffer from hemorrhoids.  The average person suffers in silence for a long period before seeking medical care.  Today's treatment methods make some types of hemorrhoid removal much less painful.  What are hemorrhoids? Often described as "varicose veins of the anus and rectum", hemorrhoids are enlarged, bulging blood vessels in and about the anus and lower rectum. There are two types of hemorrhoids: external and internal, which refer to their location.  External (outside) hemorrhoids develop near the anus and are covered by very sensitive skin. These are usually painless. However, if a blood clot (thrombosis) develops in an external hemorrhoid, it becomes a painful, hard lump. The external hemorrhoid may bleed if it ruptures. Internal (inside) hemorrhoids develop within the anus beneath the lining. Painless bleeding and protrusion during bowel movements are the most common symptom. However, an internal hemorrhoid can cause severe pain if it is completely "prolapsed" - protrudes from the anal opening and cannot be pushed back inside.   What causes hemorrhoids? An exact cause is unknown; however, the upright posture of humans alone forces a great deal of pressure on the rectal veins, which sometimes causes them to bulge. Other contributing factors include:  . Aging  . Chronic constipation or diarrhea  . Pregnancy  . Heredity  . Straining during bowel movements  . Faulty bowel function due to overuse of laxatives or enemas . Spending long periods of time (e.g., reading) on the toilet  Whatever the cause, the tissues supporting the vessels stretch. As a result, the vessels dilate; their walls become thin and bleed. If the stretching and pressure continue, the weakened vessels protrude.  What are the  symptoms? If you notice any of the following, you could have hemorrhoids:  . Bleeding during bowel movements  . Protrusion during bowel movements . Itching in the anal area  . Pain  . Sensitive lump(s)  How are hemorrhoids treated? Mild symptoms can be relieved frequently by increasing the amount of fiber (e.g., fruits, vegetables, breads and cereals) and fluids in the diet. Eliminating excessive straining reduces the pressure on hemorrhoids and helps prevent them from protruding. A sitz bath - sitting in plain warm water for about 10 minutes - can also provide some relief . With these measures, the pain and swelling of most symptomatic hemorrhoids will decrease in two to seven days, and the firm lump should recede within four to six weeks. In cases of severe or persistent pain from a thrombosed hemorrhoid, your physician may elect to remove the hemorrhoid containing the clot with a small incision. Performed under local anesthesia as an outpatient, this procedure generally provides relief. Severe hemorrhoids may require special treatment, much of which can be performed on an outpatient basis.  . Ligation - the rubber band treatment - works effectively on internal hemorrhoids that protrude with bowel movements. A small rubber band is placed over the hemorrhoid, cutting off its blood supply. The hemorrhoid and the band fall off in a few days and the wound usually heals in a week or two. This procedure sometimes produces mild discomfort and bleeding and may need to be repeated for a full effect.  There is a more intense version of this procedure that is done in the OR as outpatient surgery called THD.  It involves identifying blood vessels leading to the   hemorrhoids and then tying them off with sutures.  This method is a little more painful than rubber band ligation but less painful than traditional hemorrhoidectomy and usually does not have to be repeated.  It is best for internal hemorrhoids that  bleed.  Rubber Band Ligation of Internal Hemorrhoids:  A.  Bulging, bleeding, internal hemorrhoid B.  Rubber band applied at the base of the hemorrhoid C.  About 7 days later, the banded hemorrhoid has fallen off leaving a small scar (arrow)  . Injection and Coagulation can also be used on bleeding hemorrhoids that do not protrude. Both methods are relatively painless and cause the hemorrhoid to shrivel up. . Hemorrhoidectomy - surgery to remove the hemorrhoids - is the most complete method for removal of internal and external hemorrhoids. It is necessary when (1) clots repeatedly form in external hemorrhoids; (2) ligation fails to treat internal hemorrhoids; (3) the protruding hemorrhoid cannot be reduced; or (4) there is persistent bleeding. A hemorrhoidectomy removes excessive tissue that causes the bleeding and protrusion. It is done under anesthesia using sutures, and may, depending upon circumstances, require hospitalization and a period of inactivity. Laser hemorrhoidectomies do not offer any advantage over standard operative techniques. They are also quite expensive, and contrary to popular belief, are no less painful.  Do hemorrhoids lead to cancer? No. There is no relationship between hemorrhoids and cancer. However, the symptoms of hemorrhoids, particularly bleeding, are similar to those of colorectal cancer and other diseases of the digestive system. Therefore, it is important that all symptoms are investigated by a physician specially trained in treating diseases of the colon and rectum and that everyone 50 years or older undergo screening tests for colorectal cancer. Do not rely on over-the-counter medications or other self-treatments. See a colorectal surgeon first so your symptoms can be properly evaluated and effective treatment prescribed.  2012 American Society of Colon & Rectal Surgeons    Fiber Chart  You should 25-30g of fiber per day and drinking 8 glasses of water to help  your bowels move regularly.  In the chart below you can look up how much fiber you are getting in an average day.  If you are not getting enough fiber, you should add a fiber supplement to your diet.  Examples of this include Metamucil, FiberCon and Citrucel.  These can be purchased at your local grocery store or pharmacy.      http://www.[employer].edu/offices/health/nutritioncoach/AtoZ/handouts/Fiber.pdf    GETTING TO GOOD BOWEL HEALTH. Irregular bowel habits such as constipation can lead to many problems over time.  Having one soft bowel movement a day is the most important way to prevent further problems.  The anorectal canal is designed to handle stretching and feces to safely manage our ability to get rid of solid waste (feces, poop, stool) out of our body.  BUT, hard constipated stools can act like ripping concrete bricks causing inflamed hemorrhoids, anal fissures, abdominal pain and bloating.     The goal: ONE SOFT BOWEL MOVEMENT A DAY!  To have soft, regular bowel movements:    Drink at least 8 tall glasses of water a day.     Take plenty of fiber.  Fiber is the undigested part of plant food that passes into the colon, acting s "natures broom" to encourage bowel motility and movement.  Fiber can absorb and hold large amounts of water. This results in a larger, bulkier stool, which is soft and easier to pass. Work gradually over several weeks up to 6 servings a   day of fiber (25g a day even more if needed) in the form of: o Vegetables -- Root (potatoes, carrots, turnips), leafy green (lettuce, salad greens, celery, spinach), or cooked high residue (cabbage, broccoli, etc) o Fruit -- Fresh (unpeeled skin & pulp), Dried (prunes, apricots, cherries, etc ),  or stewed ( applesauce)  o Whole grain breads, pasta, etc (whole wheat)  o Bran cereals    Bulking Agents -- This type of water-retaining fiber generally is easily obtained each day by one of the following:  o Psyllium bran -- The psyllium  plant is remarkable because its ground seeds can retain so much water. This product is available as Metamucil, Konsyl, Effersyllium, Per Diem Fiber, or the less expensive generic preparation in drug and health food stores. Although labeled a laxative, it really is not a laxative.  o Methylcellulose -- This is another fiber derived from wood which also retains water. It is available as Citrucel. o Polyethylene Glycol - and "artificial" fiber commonly called Miralax or Glycolax.  It is helpful for people with gassy or bloated feelings with regular fiber o Flax Seed - a less gassy fiber than psyllium   No reading or other relaxing activity while on the toilet. If bowel movements take longer than 5 minutes, you are too constipated.   AVOID CONSTIPATION.  High fiber and water intake usually takes care of this.  Sometimes a laxative is needed to stimulate more frequent bowel movements, but    Laxatives are not a good long-term solution as it can wear the colon out. o Osmotics (Milk of Magnesia, Fleets phosphosoda, Magnesium citrate, MiraLax, GoLytely) are safer than  o Stimulants (Senokot, Castor Oil, Dulcolax, Ex Lax)    o Do not take laxatives for more than 7days in a row.    IF SEVERELY CONSTIPATED, try a Bowel Retraining Program: o Do not use laxatives.  o Eat a diet high in roughage, such as bran cereals and leafy vegetables.  o Drink six (6) ounces of prune or apricot juice each morning.  o Eat two (2) large servings of stewed fruit each day.  o Take one (1) heaping tablespoon of a psyllium-based bulking agent twice a day. Use sugar-free sweetener when possible to avoid excessive calories.  o Eat a normal breakfast.  o Set aside 15 minutes after breakfast to sit on the toilet, but do not strain to have a bowel movement.  o If you do not have a bowel movement by the third day, use an enema and repeat the above steps.    

## 2013-08-27 ENCOUNTER — Ambulatory Visit: Payer: Self-pay | Admitting: Oncology

## 2013-09-26 ENCOUNTER — Ambulatory Visit: Payer: Self-pay | Admitting: Oncology

## 2013-10-27 ENCOUNTER — Ambulatory Visit: Payer: Self-pay | Admitting: Oncology

## 2013-11-26 ENCOUNTER — Ambulatory Visit: Payer: Self-pay | Admitting: Oncology

## 2013-12-27 ENCOUNTER — Ambulatory Visit: Payer: Self-pay | Admitting: Oncology

## 2014-01-27 ENCOUNTER — Ambulatory Visit: Payer: Self-pay | Admitting: Oncology

## 2014-02-24 ENCOUNTER — Ambulatory Visit: Payer: Self-pay | Admitting: Oncology

## 2014-03-27 ENCOUNTER — Ambulatory Visit: Payer: Self-pay | Admitting: Oncology

## 2014-04-24 ENCOUNTER — Ambulatory Visit: Payer: Self-pay | Admitting: Oncology

## 2014-04-26 ENCOUNTER — Ambulatory Visit: Payer: Self-pay | Admitting: Oncology

## 2014-05-01 DIAGNOSIS — E119 Type 2 diabetes mellitus without complications: Secondary | ICD-10-CM | POA: Insufficient documentation

## 2014-05-22 ENCOUNTER — Ambulatory Visit: Payer: Self-pay | Admitting: Oncology

## 2014-05-23 ENCOUNTER — Observation Stay (HOSPITAL_COMMUNITY)
Admission: EM | Admit: 2014-05-23 | Discharge: 2014-05-24 | Disposition: A | Discharge status: Home / Self Care | Payer: Medicare Other | Attending: General Surgery | Admitting: General Surgery

## 2014-05-23 ENCOUNTER — Emergency Department (HOSPITAL_COMMUNITY): Payer: Medicare Other

## 2014-05-23 ENCOUNTER — Encounter (HOSPITAL_COMMUNITY): Payer: Self-pay | Admitting: Emergency Medicine

## 2014-05-23 DIAGNOSIS — K219 Gastro-esophageal reflux disease without esophagitis: Secondary | ICD-10-CM | POA: Insufficient documentation

## 2014-05-23 DIAGNOSIS — S270XXA Traumatic pneumothorax, initial encounter: Secondary | ICD-10-CM

## 2014-05-23 DIAGNOSIS — Y92009 Unspecified place in unspecified non-institutional (private) residence as the place of occurrence of the external cause: Secondary | ICD-10-CM | POA: Insufficient documentation

## 2014-05-23 DIAGNOSIS — W19XXXA Unspecified fall, initial encounter: Secondary | ICD-10-CM | POA: Diagnosis present

## 2014-05-23 DIAGNOSIS — M069 Rheumatoid arthritis, unspecified: Secondary | ICD-10-CM | POA: Insufficient documentation

## 2014-05-23 DIAGNOSIS — Z85820 Personal history of malignant melanoma of skin: Secondary | ICD-10-CM | POA: Insufficient documentation

## 2014-05-23 DIAGNOSIS — W1809XA Striking against other object with subsequent fall, initial encounter: Secondary | ICD-10-CM | POA: Insufficient documentation

## 2014-05-23 DIAGNOSIS — T797XXA Traumatic subcutaneous emphysema, initial encounter: Secondary | ICD-10-CM

## 2014-05-23 DIAGNOSIS — Z87891 Personal history of nicotine dependence: Secondary | ICD-10-CM | POA: Insufficient documentation

## 2014-05-23 DIAGNOSIS — I10 Essential (primary) hypertension: Secondary | ICD-10-CM | POA: Insufficient documentation

## 2014-05-23 DIAGNOSIS — E785 Hyperlipidemia, unspecified: Secondary | ICD-10-CM | POA: Insufficient documentation

## 2014-05-23 HISTORY — DX: Other specified postprocedural states: Z98.890

## 2014-05-23 HISTORY — DX: Nausea with vomiting, unspecified: R11.2

## 2014-05-23 HISTORY — DX: Personal history of other diseases of the digestive system: Z87.19

## 2014-05-23 HISTORY — DX: Rheumatoid arthritis, unspecified: M06.9

## 2014-05-23 HISTORY — DX: Dorsalgia, unspecified: M54.9

## 2014-05-23 HISTORY — DX: Other chronic pain: G89.29

## 2014-05-23 HISTORY — DX: Dyskinesia of esophagus: K22.4

## 2014-05-23 LAB — CBC WITH DIFFERENTIAL/PLATELET
Basophils Absolute: 0 10*3/uL (ref 0.0–0.1)
Basophils Relative: 0 % (ref 0–1)
Eosinophils Absolute: 0 10*3/uL (ref 0.0–0.7)
Eosinophils Relative: 0 % (ref 0–5)
HCT: 42.7 % (ref 39.0–52.0)
Hemoglobin: 14.7 g/dL (ref 13.0–17.0)
LYMPHS PCT: 5 % — AB (ref 12–46)
Lymphs Abs: 0.6 10*3/uL — ABNORMAL LOW (ref 0.7–4.0)
MCH: 33.4 pg (ref 26.0–34.0)
MCHC: 34.4 g/dL (ref 30.0–36.0)
MCV: 97 fL (ref 78.0–100.0)
MONOS PCT: 4 % (ref 3–12)
Monocytes Absolute: 0.4 10*3/uL (ref 0.1–1.0)
NEUTROS PCT: 91 % — AB (ref 43–77)
Neutro Abs: 10.5 10*3/uL — ABNORMAL HIGH (ref 1.7–7.7)
PLATELETS: 204 10*3/uL (ref 150–400)
RBC: 4.4 MIL/uL (ref 4.22–5.81)
RDW: 14.1 % (ref 11.5–15.5)
WBC: 11.6 10*3/uL — AB (ref 4.0–10.5)

## 2014-05-23 LAB — I-STAT CHEM 8, ED
BUN: 9 mg/dL (ref 6–23)
CREATININE: 0.9 mg/dL (ref 0.50–1.35)
Calcium, Ion: 1.09 mmol/L — ABNORMAL LOW (ref 1.13–1.30)
Chloride: 100 mEq/L (ref 96–112)
GLUCOSE: 148 mg/dL — AB (ref 70–99)
HCT: 47 % (ref 39.0–52.0)
Hemoglobin: 16 g/dL (ref 13.0–17.0)
POTASSIUM: 4 meq/L (ref 3.7–5.3)
SODIUM: 140 meq/L (ref 137–147)
TCO2: 23 mmol/L (ref 0–100)

## 2014-05-23 MED ORDER — FOLIC ACID 1 MG PO TABS
1.0000 mg | ORAL_TABLET | Freq: Every day | ORAL | Status: DC
  Administered 2014-05-23 – 2014-05-24 (×2): 1 mg via ORAL
  Filled 2014-05-23 (×2): qty 1

## 2014-05-23 MED ORDER — ENOXAPARIN SODIUM 40 MG/0.4ML ~~LOC~~ SOLN
40.0000 mg | SUBCUTANEOUS | Status: DC
  Administered 2014-05-23: 40 mg via SUBCUTANEOUS
  Filled 2014-05-23 (×2): qty 0.4

## 2014-05-23 MED ORDER — FLUTICASONE PROPIONATE 50 MCG/ACT NA SUSP
2.0000 | Freq: Every day | NASAL | Status: DC
  Filled 2014-05-23: qty 16

## 2014-05-23 MED ORDER — ONDANSETRON HCL 4 MG PO TABS: 4.0000 mg | ORAL_TABLET | Freq: Four times a day (QID) | ORAL | Status: DC | PRN

## 2014-05-23 MED ORDER — PREDNISONE 5 MG PO TABS
5.0000 mg | ORAL_TABLET | Freq: Every day | ORAL | Status: DC
  Administered 2014-05-24: 5 mg via ORAL
  Filled 2014-05-23 (×2): qty 1

## 2014-05-23 MED ORDER — ONDANSETRON HCL 4 MG/2ML IJ SOLN
4.0000 mg | Freq: Once | INTRAMUSCULAR | Status: AC
  Administered 2014-05-23: 4 mg via INTRAVENOUS
  Filled 2014-05-23: qty 2

## 2014-05-23 MED ORDER — MORPHINE SULFATE 2 MG/ML IJ SOLN
2.0000 mg | INTRAMUSCULAR | Status: DC | PRN
  Administered 2014-05-23: 2 mg via INTRAVENOUS
  Filled 2014-05-23: qty 1

## 2014-05-23 MED ORDER — PANTOPRAZOLE SODIUM 40 MG PO TBEC
40.0000 mg | DELAYED_RELEASE_TABLET | Freq: Two times a day (BID) | ORAL | Status: DC
  Administered 2014-05-23 – 2014-05-24 (×2): 40 mg via ORAL
  Filled 2014-05-23 (×2): qty 1

## 2014-05-23 MED ORDER — OXYCODONE HCL 5 MG PO TABS
5.0000 mg | ORAL_TABLET | ORAL | Status: DC | PRN
Start: 2014-05-23 — End: 2014-05-24
  Administered 2014-05-23 – 2014-05-24 (×3): 10 mg via ORAL
  Filled 2014-05-23 (×3): qty 2

## 2014-05-23 MED ORDER — HYDROMORPHONE HCL PF 1 MG/ML IJ SOLN
1.0000 mg | Freq: Once | INTRAMUSCULAR | Status: AC
  Administered 2014-05-23: 1 mg via INTRAVENOUS
  Filled 2014-05-23: qty 1

## 2014-05-23 MED ORDER — AZELASTINE HCL 0.1 % NA SOLN
1.0000 | Freq: Two times a day (BID) | NASAL | Status: DC
  Filled 2014-05-23: qty 30

## 2014-05-23 MED ORDER — ONDANSETRON HCL 4 MG/2ML IJ SOLN: 4.0000 mg | Freq: Four times a day (QID) | INTRAMUSCULAR | Status: DC | PRN

## 2014-05-23 MED ORDER — IRBESARTAN 300 MG PO TABS
300.0000 mg | ORAL_TABLET | Freq: Every day | ORAL | Status: DC
  Administered 2014-05-24: 300 mg via ORAL
  Filled 2014-05-23: qty 1

## 2014-05-23 MED ORDER — ONDANSETRON 4 MG PO TBDP
8.0000 mg | ORAL_TABLET | Freq: Once | ORAL | Status: AC
  Administered 2014-05-23: 8 mg via ORAL
  Filled 2014-05-23: qty 2

## 2014-05-23 MED ORDER — TRAMADOL HCL 50 MG PO TABS
100.0000 mg | ORAL_TABLET | Freq: Four times a day (QID) | ORAL | Status: DC
  Administered 2014-05-23 – 2014-05-24 (×3): 100 mg via ORAL
  Filled 2014-05-23 (×3): qty 2

## 2014-05-23 MED ORDER — PANTOPRAZOLE SODIUM 40 MG IV SOLR
40.0000 mg | Freq: Two times a day (BID) | INTRAVENOUS | Status: DC
  Filled 2014-05-23 (×2): qty 40

## 2014-05-23 MED ORDER — ATORVASTATIN CALCIUM 20 MG PO TABS
20.0000 mg | ORAL_TABLET | Freq: Every day | ORAL | Status: DC
  Administered 2014-05-23: 20 mg via ORAL
  Filled 2014-05-23 (×2): qty 1

## 2014-05-23 MED ORDER — DOCUSATE SODIUM 100 MG PO CAPS
100.0000 mg | ORAL_CAPSULE | Freq: Two times a day (BID) | ORAL | Status: DC
  Administered 2014-05-23: 100 mg via ORAL
  Filled 2014-05-23 (×2): qty 1

## 2014-05-23 MED ORDER — POLYETHYLENE GLYCOL 3350 17 G PO PACK
17.0000 g | PACK | Freq: Every day | ORAL | Status: DC
  Filled 2014-05-23: qty 1

## 2014-05-23 NOTE — H&P (Signed)
[name] is an 68 y.o. male.   Chief Complaint: Left-sided chest pain HPI: Patient was walking behind his couch last night he tripped on a rug. This was around 10 PM. He fell, striking his left lateral chest beneath his left arm on a concrete planter. He has some localized pain at that time. The pain persisted underneath his arm today and he came to the emergency department for evaluation. Chest x-ray demonstrated some subcutaneous emphysema and a small left pneumothorax. We are asked to evaluate from a trauma standpoint. He denies other injuries or complaints after the fall. He did not strike his head and had no loss of consciousness. He is well known to me status post colectomy in the past.  Past Medical History  Diagnosis Date  . Arthritis   . Hypertension   . Dyslipidemia   . GERD (gastroesophageal reflux disease)   . Melanoma     Past Surgical History  Procedure Laterality Date  . Colon surgery  2007    removed 18" of colon  . Leg surgery      Family History  Problem Relation Age of Onset  . Heart disease Mother   . Heart disease Father   . Cancer Maternal Grandmother    Social History:  reports that he quit smoking about 8 years ago. He has never used smokeless tobacco. He reports that he drinks alcohol. He reports that he does not use illicit drugs.  Allergies: No Known Allergies   (Not in a hospital admission)  Results for orders placed during the hospital encounter of 05/23/14 (from the past 48 hour(s))  CBC WITH DIFFERENTIAL     Status: Abnormal   Collection Time    05/23/14 12:17 PM      Result Value Ref Range   WBC 11.6 (*) 4.0 - 10.5 K/uL   RBC 4.40  4.22 - 5.81 MIL/uL   Hemoglobin 14.7  13.0 - 17.0 g/dL   HCT 42.7  39.0 - 52.0 %   MCV 97.0  78.0 - 100.0 fL   MCH 33.4  26.0 - 34.0 pg   MCHC 34.4  30.0 - 36.0 g/dL   RDW 14.1  11.5 - 15.5 %   Platelets 204  150 - 400 K/uL   Neutrophils Relative % 91 (*) 43 - 77 %   Neutro Abs 10.5 (*) 1.7 - 7.7  K/uL   Lymphocytes Relative 5 (*) 12 - 46 %   Lymphs Abs 0.6 (*) 0.7 - 4.0 K/uL   Monocytes Relative 4  3 - 12 %   Monocytes Absolute 0.4  0.1 - 1.0 K/uL   Eosinophils Relative 0  0 - 5 %   Eosinophils Absolute 0.0  0.0 - 0.7 K/uL   Basophils Relative 0  0 - 1 %   Basophils Absolute 0.0  0.0 - 0.1 K/uL  I-STAT CHEM 8, ED     Status: Abnormal   Collection Time    05/23/14 12:26 PM      Result Value Ref Range   Sodium 140  137 - 147 mEq/L   Potassium 4.0  3.7 - 5.3 mEq/L   Chloride 100  96 - 112 mEq/L   BUN 9  6 - 23 mg/dL   Creatinine, Ser 0.90  0.50 - 1.35 mg/dL   Glucose, Bld 148 (*) 70 - 99 mg/dL   Calcium, Ion 1.09 (*) 1.13 - 1.30 mmol/L   TCO2 23  0 - 100 mmol/L   Hemoglobin 16.0  13.0 -  17.0 g/dL   HCT 47.0  39.0 - 52.0 %   Dg Chest Portable 1 View  05/23/2014   CLINICAL DATA:  Recent traumatic injury with pain  EXAM: PORTABLE CHEST - 1 VIEW  COMPARISON:  03/21/2013  FINDINGS: Cardiac shadow is within normal limits. The lungs are well aerated bilaterally. A mild left pneumothorax is noted predominately laterally. It is associated subcutaneous emphysema is noted. These changes are likely related underlying rib fractures although these are incompletely evaluated on this exam.  IMPRESSION: Changes consistent with a small left pneumothorax and subcutaneous emphysema. There are likely rib fractures present although they are incompletely evaluated on this exam.  These results were called by telephone at the time of interpretation on 05/23/2014 at 12:06 PM to Dr. Tamera Punt, who verbally acknowledged these results.   Electronically Signed   By: Inez Catalina M.D.   On: 05/23/2014 12:06    Review of Systems  Constitutional: Negative for weight loss and malaise/fatigue.  HENT: Negative.   Eyes: Negative.   Respiratory:       Pain with deep respiration left chest  Cardiovascular: Positive for chest pain. Negative for palpitations.       Left lateral rib pain  Gastrointestinal: Negative.    Genitourinary: Negative.   Musculoskeletal: Positive for joint pain.       History of rheumatoid arthritis  Skin: Negative.   Neurological: Negative.   Endo/Heme/Allergies: Negative.   Psychiatric/Behavioral: Negative.     Pulse 103, temperature 97.2 F (36.2 C), temperature source Oral, resp. rate 18, height 6\' 1"  (1.854 m), weight 200 lb (90.719 kg), SpO2 96.00%. Physical Exam  Constitutional: He is oriented to person, place, and time. He appears well-developed and well-nourished. No distress.  HENT:  Head: Normocephalic and atraumatic. Head is without abrasion and without contusion.  Right Ear: Hearing, tympanic membrane, external ear and ear canal normal.  Left Ear: Hearing, tympanic membrane, external ear and ear canal normal.  Nose: No sinus tenderness or nasal deformity. No epistaxis.  Mouth/Throat: Uvula is midline, oropharynx is clear and moist and mucous membranes are normal.  Neck: Normal range of motion. Neck supple. No tracheal deviation present.  Cardiovascular: Normal rate and normal heart sounds.   Respiratory: Effort normal and breath sounds normal. No stridor. No respiratory distress. He has no wheezes. He exhibits tenderness.  Left lateral rib pain, especially lower ribs, mild subcutaneous emphysema, breath sounds clear and equal bilaterally  Musculoskeletal: Normal range of motion.  Mild peripheral edema ankles  Neurological: He is alert and oriented to person, place, and time. He displays no atrophy and no tremor. He exhibits normal muscle tone. He displays no seizure activity. GCS eye subscore is 4. GCS verbal subscore is 5. GCS motor subscore is 6.  Moves all extremities well  Skin: Skin is warm and dry.  Psychiatric: He has a normal mood and affect.     Assessment/Plan Status post fall with left pneumothorax and mild subcutaneous emphysema. I suspect he has a rib fracture or 2. We'll plan to admit, give oxygen therapy and continuous pulse oximetry, and check  followup chest x-ray in the morning. If his pneumothorax gets significantly larger, he may need chest tube insertion. At this time we will observe. Plan was discussed in detail with the patient.  Zenovia Jarred 05/23/2014, 12:37 PM
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
## 2014-05-23 NOTE — ED Provider Notes (Signed)
Medical screening examination/treatment/procedure(s) were conducted as a shared visit with non-physician practitioner(s) and myself.  I personally evaluated the patient during the encounter.   EKG Interpretation   Date/Time:  Thursday May 23 2014 11:12:37 EDT Ventricular Rate:  109 PR Interval:  134 QRS Duration: 99 QT Interval:  372 QTC Calculation: 501 R Axis:   72 Text Interpretation:  Sinus tachycardia Probable left atrial enlargement  Low voltage, precordial leads Prolonged QT interval Baseline wander in  lead(s) II III aVF since last tracing no significant change Confirmed by  Kalecia Hartney  MD, Emmerson Shuffield ([id number]) on 05/23/2014 1:09:57 PM      Pt with left rib pain after fall, +subcut emphysema, small PTX on x-ray.  Will admit to trauma  Malvin Johns, MD 05/23/14 [phone]

## 2014-05-23 NOTE — ED Notes (Signed)
Per EMS: Pt tripped and fell last night and landed on left side, pt c/o left sided rib pain and pain with inspiration. EMS notes no deformity, lung sounds equal and clear. Pt started vomiting today and was sent from [facility] here. Denies LOC or taking any blood thinners. Axo x4.

## 2014-05-24 ENCOUNTER — Observation Stay (HOSPITAL_COMMUNITY): Payer: Medicare Other

## 2014-05-24 DIAGNOSIS — W19XXXA Unspecified fall, initial encounter: Secondary | ICD-10-CM | POA: Diagnosis present

## 2014-05-24 LAB — CBC
HCT: 37.9 % — ABNORMAL LOW (ref 39.0–52.0)
Hemoglobin: 12.5 g/dL — ABNORMAL LOW (ref 13.0–17.0)
MCH: 32.8 pg (ref 26.0–34.0)
MCHC: 33 g/dL (ref 30.0–36.0)
MCV: 99.5 fL (ref 78.0–100.0)
PLATELETS: 157 10*3/uL (ref 150–400)
RBC: 3.81 MIL/uL — ABNORMAL LOW (ref 4.22–5.81)
RDW: 14.3 % (ref 11.5–15.5)
WBC: 7.9 10*3/uL (ref 4.0–10.5)

## 2014-05-24 MED ORDER — TRAMADOL HCL 50 MG PO TABS: 100.0000 mg | ORAL_TABLET | Freq: Four times a day (QID) | ORAL | Status: DC

## 2014-05-24 MED ORDER — OXYCODONE-ACETAMINOPHEN 5-325 MG PO TABS: 1.0000 | ORAL_TABLET | ORAL | Status: DC | PRN

## 2014-05-24 NOTE — Discharge Summary (Signed)
Physician Discharge Summary  Patient ID: [name] MRN: [id number] DOB/AGE: 69/17/1947 69 y.o.  Admit date: 05/23/2014 Discharge date: 05/24/2014  Discharge Diagnoses Patient Active Problem List   Diagnosis Date Noted  . Fall 05/24/2014  . Traumatic pneumothorax 05/23/2014  . Perirectal abscess 07/31/2013  . Hemorrhoids 07/31/2013  . Dyspnea 08/14/2012  . Rheumatoid arthritis 08/14/2012  . HTN (hypertension) 08/14/2012  . GERD (gastroesophageal reflux disease) 08/14/2012  . Emphysema 08/14/2012    Consultants None   Procedures None   HPI: [name] was walking behind his couch the night prior to admission when he tripped on a rug. This was around 10 PM. He fell, striking his left lateral chest beneath his left arm on a concrete planter. He has some localized pain at that time. The pain persisted and he came to the emergency department for evaluation. Chest x-ray demonstrated some subcutaneous emphysema and a small left pneumothorax. He was admitted for pain control and pulmonary toilet.   Hospital Course: The patient did well overnight in the hospital. His pain was controlled with oral medications and his pneumothorax was no longer visible on a follow-up chest x-ray the next morning. He was discharged home in good condition.      Medication List         abatacept 250 MG injection  Commonly known as:  ORENCIA  Inject into the vein every 28 (twenty-eight) days.     atorvastatin 20 MG tablet  Commonly known as:  LIPITOR  Take 20 mg by mouth daily.     azelastine 0.1 % nasal spray  Commonly known as:  ASTELIN  Place 1 spray into the nose 2 (two) times daily. Use in each nostril as directed     DIOVAN 320 MG tablet  Generic drug:  valsartan  Take 320 mg by mouth daily.     fluticasone 50 MCG/ACT nasal spray  Commonly known as:  FLONASE  Place 2 sprays into the nose daily.     folic acid 1 MG tablet  Commonly known as:  FOLVITE  Take 1 mg by mouth daily.      irbesartan 300 MG tablet  Commonly known as:  AVAPRO  Take 300 mg by mouth daily.     methotrexate 2.5 MG tablet  Commonly known as:  RHEUMATREX  Take 12.5 mg by mouth once a week. Caution:Chemotherapy. Protect from light.; takes 5 tablets (12.5mg ) weekly on Mondays     omeprazole 20 MG capsule  Commonly known as:  PRILOSEC  Take 20 mg by mouth 2 (two) times daily.     omeprazole 40 MG capsule  Commonly known as:  PRILOSEC  Take 40 mg by mouth daily.     oxyCODONE-acetaminophen 5-325 MG per tablet  Commonly known as:  ROXICET  Take 1-2 tablets by mouth every 4 (four) hours as needed (Pain).     predniSONE 5 MG tablet  Commonly known as:  DELTASONE  Take 5 mg by mouth daily.     tadalafil 5 MG tablet  Commonly known as:  CIALIS  Take 5 mg by mouth daily.     traMADol 50 MG tablet  Commonly known as:  ULTRAM  Take 2 tablets (100 mg total) by mouth every 6 (six) hours.             Follow-up Information   Call [facility]. (As needed)    Contact information:   [address] [place] [address] [phone]  Signed: Lisette Abu, PA-C Pager: [phone] General Trauma PA Pager: [phone] 05/24/2014, 10:30 AM

## 2014-05-24 NOTE — Discharge Instructions (Signed)
No driving while taking oxycodone. ° °Increase activity as pain allows. °

## 2014-05-24 NOTE — Progress Notes (Signed)
Discussed discharge summary with pt. Pt received Rx. Pt did not have any questions. Pt ready for discharge.

## 2014-05-24 NOTE — Progress Notes (Signed)
Improved. CXR better. Will D/C. Patient examined and I agree with the assessment and plan  Georganna Skeans, MD, MPH, FACS Trauma: [phone] General Surgery: [phone]  05/24/2014 10:24 AM

## 2014-05-24 NOTE — Discharge Summary (Signed)
[name], MD, MPH, FACS Trauma: [phone] General Surgery: [phone]  

## 2014-05-24 NOTE — Progress Notes (Signed)
Patient ID: [name], male   DOB: Apr 11, 1946, 68 y.o.   MRN: [id number]   LOS: 1 day   Subjective: No unexpected c/o. Pain manageable, no SOB.   Objective: Vital signs in last 24 hours: Temp:  [97.2 F (36.2 C)-98.9 F (37.2 C)] 98.9 F (37.2 C) (05/29 0554) Pulse Rate:  [59-103] 59 (05/29 0554) Resp:  [17-24] 18 (05/29 0554) BP: (123-151)/(66-99) 144/76 mmHg (05/29 0554) SpO2:  [95 %-100 %] 98 % (05/29 0554) Weight:  [200 lb (90.719 kg)] 200 lb (90.719 kg) (05/28 1111) Last BM Date: 05/22/14   IS: 2260ml   Radiology Results CXR: No obvious PTX, slight increase BLL ATX (official read pending)   Physical Exam General appearance: alert and no distress Resp: clear to auscultation bilaterally Cardio: regular rate and rhythm GI: normal findings: bowel sounds normal and soft, non-tender   Assessment/Plan: Fall Left PTX -- Resolved on CXR, ATX worse. Pulmonary toilet. Multiple medical problems -- Home meds Dispo -- Likely home today if formal read of CXR agrees and MD approves    Lisette Abu, PA-C Pager: [phone] General Trauma PA Pager: [phone]  05/24/2014

## 2014-05-27 ENCOUNTER — Ambulatory Visit: Payer: Self-pay | Admitting: Oncology

## 2014-06-26 ENCOUNTER — Ambulatory Visit: Payer: Self-pay | Admitting: Oncology

## 2014-07-23 ENCOUNTER — Encounter: Payer: Medicare Other | Attending: Family Medicine | Admitting: *Deleted

## 2014-07-23 ENCOUNTER — Encounter: Payer: Self-pay | Admitting: *Deleted

## 2014-07-23 VITALS — Ht 73.5 in | Wt 207.0 lb

## 2014-07-23 DIAGNOSIS — Z713 Dietary counseling and surveillance: Secondary | ICD-10-CM | POA: Insufficient documentation

## 2014-07-23 DIAGNOSIS — E119 Type 2 diabetes mellitus without complications: Secondary | ICD-10-CM | POA: Insufficient documentation

## 2014-07-23 NOTE — Progress Notes (Signed)
Appt start time: 1400 end time:  1530.  Assessment:  Patient was seen on  07/23/14 for individual diabetes education and glucose monitoring instruction. Lives alone, buys and cooks own meals. Eats out occasionally. 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 He states he has elypitical machine at home plus does extra push ups etc for 30 minutes 4 times a week. Does not have a meter yet. He states he has Rheumatoid Arthritis and has been on Prednisone since 1988. This is the first time he is aware of having any problems with his BG.  Patient Education Plan per assessed needs and concerns is to attend individual session for Diabetes Self Management Education.  Current HbA1c: 6.5%  Preferred Learning Style:   No preference indicated   Learning Readiness:   Ready  Change in progress  MEDICATIONS: see list  DIETARY INTAKE: Avoided foods include chips.    24-hr recall:  B ( AM): skips  Snk ( AM): no  L ( PM): main meal: cafeteria for lean meat, vegetables, starch in moderation, bread occasionally, water or occasionally Dr. Malachi Bonds Snk ( PM): no D ( PM): breakfast for dinner; sausage, egg, potato, milk OR sandwich, water Snk ( PM): candy occasionally Beverages: water, Dr. Malachi Bonds,   Usual physical activity: works out at home for 30 minutes 4 days a week  Estimated energy needs: 1600 calories 180 g carbohydrates 120 g protein 44 g fat  Progress Towards Goal(s):  In progress.   Nutritional Diagnosis:  NB-1.1 Food and nutrition-related knowledge deficit As related to diabetes.  As evidenced by new diagnosis.    Intervention:  Nutrition counseling provided.  Discussed diabetes disease process and treatment options.  Discussed physiology of diabetes and role of obesity on insulin resistance.  Encouraged moderate weight reduction to improve glucose levels.    Provided education on macronutrients on glucose levels.  Provided education on carb  counting, importance of regularly scheduled meals/snacks, and meal planning  Discussed effects of physical activity on glucose levels and long-term glucose control.  Recommended 150 minutes of physical activity/week.  Reviewed patient medications.  Discussed role of medication on blood glucose and possible side effects  Discussed blood glucose monitoring and interpretation.  Discussed recommended target ranges and individual ranges.    Described short-term complications: hyper- and hypo-glycemia.  Discussed causes,symptoms, and treatment options.  Discussed prevention, detection, and treatment of long-term complications.  Discussed the role of prolonged elevated glucose levels on body systems.  Discussed role of stress on blood glucose levels and discussed strategies to manage psychosocial issues.  Discussed recommendations for long-term diabetes self-care.  Provided checklist for medical, dental, and emotional self-care. Intervention for BG Monitoring:    Explained rationale of testing BG to obtain data as to how their diabetes is being managed.  Provided Target Ranges for both pre and post meals  Explained factors that effect BG including food (carbohydrate), stress, activity level and insulin availability in the body including diabetes medications  Taught patient techniques for using BG monitor and lancing device  Discussed need for Rx for strips and lancets   Explained rationale of recording BG both for patient and MD to assess patterns as needed.  Plan:  Aim for 3 Carb Choices per meal (45 grams) +/- 1 either way  Aim for 0-2 Carbs per snack if hungry  Include protein in moderation with your meals and snacks Consider reading food labels for Total Carbohydrate of foods Continue with your  activity level as tolerated Consider checking BG at alternate times per day    Teaching Method Utilized: Visual, Auditory and Hands on  Handouts given during visit include: Living Well  with Diabetes Carb Counting and Food Label handouts Meal Plan Card  BG Monitoring handout  Meter Provided:  Yes or No  If Yes, Brand: Accu Chek Nano BG Monitoring Kit Lot #: [id number] Expiration Date:06/26/15  Barriers to learning/adherence to lifestyle change: none  Diabetes self-care support plan:   [facility] support group available  Demonstrated degree of understanding via:  Teach Back   Monitoring/Evaluation:  Dietary intake, exercise, SMBG, and body weight prn.

## 2014-07-23 NOTE — Patient Instructions (Signed)
Plan:  Aim for 3 Carb Choices per meal (45 grams) +/- 1 either way  Aim for 0-2 Carbs per snack if hungry  Include protein in moderation with your meals and snacks Consider reading food labels for Total Carbohydrate of foods Continue with your activity level as tolerated Consider checking BG at alternate times per day

## 2014-07-27 ENCOUNTER — Ambulatory Visit: Payer: Self-pay | Admitting: Oncology

## 2014-08-27 ENCOUNTER — Ambulatory Visit: Payer: Self-pay | Admitting: Oncology

## 2014-09-26 ENCOUNTER — Ambulatory Visit: Payer: Self-pay | Admitting: Oncology

## 2014-10-27 ENCOUNTER — Ambulatory Visit: Payer: Self-pay | Admitting: Oncology

## 2014-11-20 ENCOUNTER — Ambulatory Visit: Payer: Self-pay

## 2014-11-20 LAB — BASIC METABOLIC PANEL
ANION GAP: 8 (ref 7–16)
BUN: 14 mg/dL (ref 7–18)
CHLORIDE: 102 mmol/L (ref 98–107)
CO2: 29 mmol/L (ref 21–32)
Calcium, Total: 9.4 mg/dL (ref 8.5–10.1)
Creatinine: 1.29 mg/dL (ref 0.60–1.30)
EGFR (Non-African Amer.): 59 — ABNORMAL LOW
Glucose: 144 mg/dL — ABNORMAL HIGH (ref 65–99)
OSMOLALITY: 281 (ref 275–301)
Potassium: 4.6 mmol/L (ref 3.5–5.1)
Sodium: 139 mmol/L (ref 136–145)

## 2014-11-20 LAB — CBC WITH DIFFERENTIAL/PLATELET
BASOS PCT: 0.7 %
Basophil #: 0.1 10*3/uL (ref 0.0–0.1)
EOS ABS: 0 10*3/uL (ref 0.0–0.7)
EOS PCT: 0.5 %
HCT: 41.8 % (ref 40.0–52.0)
HGB: 13.7 g/dL (ref 13.0–18.0)
LYMPHS PCT: 10.6 %
Lymphocyte #: 1 10*3/uL (ref 1.0–3.6)
MCH: 32.8 pg (ref 26.0–34.0)
MCHC: 32.7 g/dL (ref 32.0–36.0)
MCV: 100 fL (ref 80–100)
Monocyte #: 0.5 x10 3/mm (ref 0.2–1.0)
Monocyte %: 4.9 %
NEUTROS ABS: 8 10*3/uL — AB (ref 1.4–6.5)
NEUTROS PCT: 83.3 %
PLATELETS: 172 10*3/uL (ref 150–440)
RBC: 4.18 10*6/uL — ABNORMAL LOW (ref 4.40–5.90)
RDW: 15 % — ABNORMAL HIGH (ref 11.5–14.5)
WBC: 9.6 10*3/uL (ref 3.8–10.6)

## 2014-11-20 LAB — HEMOGLOBIN A1C: HEMOGLOBIN A1C: 7.5 % — AB (ref 4.2–6.3)

## 2014-11-20 LAB — SGOT (AST)(ARMC): AST: 28 U/L (ref 15–37)

## 2014-11-20 LAB — ALT: SGPT (ALT): 31 U/L

## 2014-11-26 ENCOUNTER — Ambulatory Visit: Payer: Self-pay | Admitting: Oncology

## 2014-12-27 ENCOUNTER — Ambulatory Visit: Payer: Self-pay | Admitting: Oncology

## 2015-01-21 ENCOUNTER — Other Ambulatory Visit: Payer: Self-pay | Admitting: Family Medicine

## 2015-01-21 DIAGNOSIS — R221 Localized swelling, mass and lump, neck: Secondary | ICD-10-CM

## 2015-01-24 ENCOUNTER — Ambulatory Visit
Admission: RE | Admit: 2015-01-24 | Discharge: 2015-01-24 | Disposition: A | Discharge status: Home / Self Care | Payer: Medicare Other | Source: Ambulatory Visit | Attending: Family Medicine | Admitting: Family Medicine

## 2015-01-24 DIAGNOSIS — R221 Localized swelling, mass and lump, neck: Secondary | ICD-10-CM

## 2015-01-24 MED ORDER — IOHEXOL 300 MG/ML  SOLN
75.0000 mL | Freq: Once | INTRAMUSCULAR | Status: AC | PRN
  Administered 2015-01-24: 75 mL via INTRAVENOUS

## 2015-01-27 ENCOUNTER — Ambulatory Visit: Payer: Self-pay | Admitting: Oncology

## 2015-01-29 ENCOUNTER — Other Ambulatory Visit (HOSPITAL_COMMUNITY): Payer: Self-pay | Admitting: Otolaryngology

## 2015-01-29 DIAGNOSIS — R599 Enlarged lymph nodes, unspecified: Secondary | ICD-10-CM

## 2015-02-05 ENCOUNTER — Other Ambulatory Visit: Payer: Self-pay | Admitting: Radiology

## 2015-02-06 ENCOUNTER — Ambulatory Visit (HOSPITAL_COMMUNITY)
Admission: RE | Admit: 2015-02-06 | Discharge: 2015-02-06 | Disposition: A | Discharge status: Home / Self Care | Payer: Medicare Other | Source: Ambulatory Visit | Attending: Otolaryngology | Admitting: Otolaryngology

## 2015-02-06 ENCOUNTER — Encounter (HOSPITAL_COMMUNITY): Payer: Self-pay

## 2015-02-06 DIAGNOSIS — J439 Emphysema, unspecified: Secondary | ICD-10-CM | POA: Insufficient documentation

## 2015-02-06 DIAGNOSIS — Z85820 Personal history of malignant melanoma of skin: Secondary | ICD-10-CM | POA: Diagnosis not present

## 2015-02-06 DIAGNOSIS — K219 Gastro-esophageal reflux disease without esophagitis: Secondary | ICD-10-CM | POA: Diagnosis not present

## 2015-02-06 DIAGNOSIS — Z7952 Long term (current) use of systemic steroids: Secondary | ICD-10-CM | POA: Insufficient documentation

## 2015-02-06 DIAGNOSIS — I10 Essential (primary) hypertension: Secondary | ICD-10-CM | POA: Insufficient documentation

## 2015-02-06 DIAGNOSIS — E119 Type 2 diabetes mellitus without complications: Secondary | ICD-10-CM | POA: Insufficient documentation

## 2015-02-06 DIAGNOSIS — M069 Rheumatoid arthritis, unspecified: Secondary | ICD-10-CM | POA: Diagnosis not present

## 2015-02-06 DIAGNOSIS — E785 Hyperlipidemia, unspecified: Secondary | ICD-10-CM | POA: Diagnosis not present

## 2015-02-06 DIAGNOSIS — R591 Generalized enlarged lymph nodes: Secondary | ICD-10-CM | POA: Insufficient documentation

## 2015-02-06 DIAGNOSIS — K224 Dyskinesia of esophagus: Secondary | ICD-10-CM | POA: Diagnosis not present

## 2015-02-06 DIAGNOSIS — Z7982 Long term (current) use of aspirin: Secondary | ICD-10-CM | POA: Insufficient documentation

## 2015-02-06 DIAGNOSIS — Z87891 Personal history of nicotine dependence: Secondary | ICD-10-CM | POA: Diagnosis not present

## 2015-02-06 DIAGNOSIS — R599 Enlarged lymph nodes, unspecified: Secondary | ICD-10-CM

## 2015-02-06 DIAGNOSIS — Z79899 Other long term (current) drug therapy: Secondary | ICD-10-CM | POA: Diagnosis not present

## 2015-02-06 LAB — CBC
HEMATOCRIT: 40.5 % (ref 39.0–52.0)
HEMOGLOBIN: 13.6 g/dL (ref 13.0–17.0)
MCH: 31.9 pg (ref 26.0–34.0)
MCHC: 33.6 g/dL (ref 30.0–36.0)
MCV: 95.1 fL (ref 78.0–100.0)
Platelets: 177 10*3/uL (ref 150–400)
RBC: 4.26 MIL/uL (ref 4.22–5.81)
RDW: 14.8 % (ref 11.5–15.5)
WBC: 9.3 10*3/uL (ref 4.0–10.5)

## 2015-02-06 LAB — APTT: aPTT: 32 seconds (ref 24–37)

## 2015-02-06 LAB — PROTIME-INR
INR: 1.02 (ref 0.00–1.49)
Prothrombin Time: 13.5 seconds (ref 11.6–15.2)

## 2015-02-06 MED ORDER — SODIUM CHLORIDE 0.9 % IV SOLN
INTRAVENOUS | Status: DC
  Administered 2015-02-06: 14:00:00 via INTRAVENOUS

## 2015-02-06 MED ORDER — LIDOCAINE HCL (PF) 1 % IJ SOLN
INTRAMUSCULAR | Status: AC
  Filled 2015-02-06: qty 10

## 2015-02-06 NOTE — Procedures (Signed)
US guided FNAs (7) of right upper cervical lymph node.  No immediate complication.

## 2015-02-06 NOTE — H&P (Signed)
Chief Complaint: Enlarged Rt cervical lymph node  Referring Physician(s): Crossley,James J  History of Present Illness: [name] is a 69 y.o. male   Pt with Hx melanoma New Rt neck pain x few weeks CT 01/24/15 reveals R cervical lymph node enlargement Now scheduled for bx of same   Past Medical History  Diagnosis Date  . Hypertension   . Dyslipidemia   . GERD (gastroesophageal reflux disease)   . PONV (postoperative nausea and vomiting)   . Pneumonia     "couple times"  . Type II diabetes mellitus 04/2014 dx'd    "mild case"  . History of blood transfusion 1964  . H/O hiatal hernia   . Rheumatoid arthritis   . Melanoma     "back; left arm"  . Chronic back pain   . Spasm of esophagus     Past Surgical History  Procedure Laterality Date  . Colon surgery  2007    removed 18" of colon  . Tibia fracture surgery Left 2006    "put a steel rod in it"  . Carpal tunnel release Bilateral   . Hand surgery Bilateral     "plastic knuckles"  . Pelvic fracture surgery  1964    "busted in 2 places"  . Nasal septum surgery    . Melanoma excision      "back; left arm"  . Laparoscopic cholecystectomy  01/2006  . Ercp w/ metal stent placement  11/2005    Archie Endo 12/14/2005  . Esophagogastroduodenoscopy (egd) with esophageal dilation      "several times'    Allergies: Review of patient's allergies indicates no known allergies.  Medications: Prior to Admission medications   Medication Sig Start Date End Date Taking? Authorizing Provider  abatacept (ORENCIA) 250 MG injection Inject 750 mg into the vein every 28 (twenty-eight) days.    Yes Historical Provider, MD  aspirin 325 MG tablet Take 325 mg by mouth daily.   Yes Historical Provider, MD  atorvastatin (LIPITOR) 20 MG tablet Take 20 mg by mouth daily.   Yes Historical Provider, MD  azelastine (ASTELIN) 137 MCG/SPRAY nasal spray Place 1 spray into the nose 2 (two) times daily. Use in each nostril as directed   Yes  Historical Provider, MD  fluticasone (FLONASE) 50 MCG/ACT nasal spray Place 2 sprays into the nose daily.   Yes Historical Provider, MD  folic acid (FOLVITE) 1 MG tablet Take 1 mg by mouth daily. 02/13/10  Yes Historical Provider, MD  irbesartan (AVAPRO) 300 MG tablet Take 300 mg by mouth daily.   Yes Historical Provider, MD  methotrexate (RHEUMATREX) 2.5 MG tablet Take 12.5 mg by mouth once a week. Caution:Chemotherapy. Protect from light.; takes 5 tablets (12.5mg ) weekly on Mondays   Yes Historical Provider, MD  omeprazole (PRILOSEC) 40 MG capsule Take 40 mg by mouth daily.   Yes Historical Provider, MD  oxyCODONE-acetaminophen (ROXICET) 5-325 MG per tablet Take 1-2 tablets by mouth every 4 (four) hours as needed (Pain). 05/24/14  Yes Lisette Abu, PA-C  predniSONE (DELTASONE) 5 MG tablet Take 5 mg by mouth daily.   Yes Historical Provider, MD  tadalafil (CIALIS) 5 MG tablet Take 5 mg by mouth daily.   Yes Historical Provider, MD  traMADol (ULTRAM) 50 MG tablet Take 2 tablets (100 mg total) by mouth every 6 (six) hours. 05/24/14  Yes Lisette Abu, PA-C  valsartan (DIOVAN) 320 MG tablet Take 320 mg by mouth daily. 07/29/10  Yes Historical Provider, MD  Family History  Problem Relation Age of Onset  . Heart disease Mother   . Heart disease Father   . Cancer Maternal Grandmother     History   Social History  . Marital Status: Divorced    Spouse Name: N/A  . Number of Children: N/A  . Years of Education: N/A   Social History Main Topics  . Smoking status: Former Smoker -- 1.00 packs/day for 46 years    Types: Cigarettes    Quit date: 11/26/2005  . Smokeless tobacco: Never Used  . Alcohol Use: 5.4 oz/week    9 Shots of liquor per week     Comment: 05/23/2014 "3, 1 shot drinks maybe 3 times/wk"  . Drug Use: No  . Sexual Activity: Not Currently   Other Topics Concern  . None   Social History Narrative    Review of Systems: A 12 point ROS discussed and pertinent positives  are indicated in the HPI above.  All other systems are negative.  Review of Systems  Constitutional: Negative for fever, activity change and fatigue.  HENT: Negative for mouth sores, sore throat, trouble swallowing and voice change.   Cardiovascular: Negative for chest pain.  Gastrointestinal: Negative for abdominal pain.  Musculoskeletal: Negative for back pain and neck pain.  Neurological: Negative for weakness.  Psychiatric/Behavioral: Negative for behavioral problems and confusion.    Vital Signs: BP 112/62 mmHg  Pulse 73  Temp(Src) 98.2 F (36.8 C) (Oral)  Resp 18  Ht 6\' 2"  (1.88 m)  Wt 90.719 kg (200 lb)  BMI 25.67 kg/m2  SpO2 100%  Physical Exam  Constitutional: He is oriented to person, place, and time. He appears well-nourished.  HENT:  Rt neck pain Not able to palpate abnormality  Cardiovascular: Normal rate, regular rhythm and normal heart sounds.   No murmur heard. Pulmonary/Chest: Effort normal and breath sounds normal. He has no wheezes.  Abdominal: Soft. Bowel sounds are normal. There is no tenderness.  Musculoskeletal: Normal range of motion.  Neurological: He is alert and oriented to person, place, and time.  Skin: Skin is warm and dry.  Psychiatric: He has a normal mood and affect. His behavior is normal. Judgment and thought content normal.  Nursing note and vitals reviewed.   Imaging: Ct Soft Tissue Neck W Contrast  01/24/2015   ADDENDUM REPORT: 01/24/2015 16:22  ADDENDUM: Study discussed by telephone with Dr. Doreene Burke NNODI on 01/24/2015 at 1620 hrs.   Electronically Signed   By: Lars Pinks M.D.   On: 01/24/2015 16:22   01/24/2015   CLINICAL DATA:  69 year old male with right neck swelling discovered 1 week ago. Painful to palpation. Personal history of previous melanoma.  BUN and creatinine were obtained on site at [facility] at  [address]  Results:  BUN 1.1 mg/dL,  Creatinine 21 mg/dL.  EXAM: CT NECK WITH CONTRAST  TECHNIQUE:  Multidetector CT imaging of the neck was performed using the standard protocol following the bolus administration of intravenous contrast.  CONTRAST:  5mL OMNIPAQUE IOHEXOL 300 MG/ML  SOLN  COMPARISON:  [facility] MRI cervical spine 03/02/2013. Paranasal sinus CT 09/21/2007.  FINDINGS: Pharynx and larynx: Within normal limits, no discrete laryngeal or pharyngeal mass identified. Negative retropharyngeal space, parapharyngeal spaces and sublingual space.  Salivary glands: Mild mass effect on the right submandibular gland from abnormal nodal tissue (see below), otherwise negative submandibular and salivary glands.  Thyroid: Within normal limits for age; subcentimeter low-density nodule in the right lobe does not  meet criteria for further ultrasound evaluation.  Lymph nodes: Area of clinical concern marked at the right lateral neck on series 2, image 44. In the region of the skin marker there are multiple abnormally enlarged and heterogeneous soft tissue nodules which most resemble malignant lymph nodes. The largest is at the right level IIa station measuring 25 x 18 x 30 (AP by transverse by CC). This lesion is mildly spiculated, as is a smaller lesion located just inferior and lateral to the hyoid bone encompassing 15 x 21 x 18 mm.  No level 1 lymphadenopathy. Small lower right level IIIb node on series 2, image 50 9 May be physiologic. No level 4 lymphadenopathy. No contralateral lymphadenopathy.  Vascular: The right internal jugular vein is almost completely effaced by the right neck soft tissue masses (series 2, image 39). Otherwise, major vascular structures in the neck and at the skullbase are patent. Right greater than left carotid bifurcation atherosclerosis.  Limited intracranial: Negative.  Visualized orbits: Negative.  Mastoids and visualized paranasal sinuses: Well pneumatized. Changes of chronic sinusitis suspected on the right.  Skeleton: Advanced degenerative changes in the  cervical spine. Lucency in the right C2 vertebra at near the base of the odontoid is nonspecific. Chronic left upper rib fractures posteriorly. No other suspicious osseous lesion identified.  Upper chest: Negative lung apices. Small superior mediastinal lymph nodes are within normal limits. There is a small amount of retained debris in the upper esophagus.  IMPRESSION: 1. Palpable area of concern corresponds to malignant appearing soft tissue nodules in the neck most compatible with nodal/ex nodal metastatic disease. This might reflect metastatic melanoma given the patient history, or be related to an occult pharyngeal/laryngeal primary. 2. Indeterminate 9 mm lucent lesion in the C2 vertebra, such that early metastatic disease to bone is difficult to exclude. 3. Severely compressed right internal jugular vein, but remains patent at this time.  Electronically Signed: By: Lars Pinks M.D. On: 01/24/2015 16:12    Labs:  CBC:  Recent Labs  05/23/14 1217 05/23/14 1226 05/24/14 0845  WBC 11.6*  --  7.9  HGB 14.7 16.0 12.5*  HCT 42.7 47.0 37.9*  PLT 204  --  157    COAGS: No results for input(s): INR, APTT in the last 8760 hours.  BMP:  Recent Labs  05/23/14 1226  NA 140  K 4.0  CL 100  GLUCOSE 148*  BUN 9  CREATININE 0.90    LIVER FUNCTION TESTS: No results for input(s): BILITOT, AST, ALT, ALKPHOS, PROT, ALBUMIN in the last 8760 hours.  TUMOR MARKERS: No results for input(s): AFPTM, CEA, CA199, CHROMGRNA in the last 8760 hours.  Assessment and Plan:  Rt neck pain CT reveals Rt cervical lymph node enlargement Now scheduled for bx (Hx melanoma) Pt aware procedure benefits and risks including but not limited to: Infection, bleeding, vessel damage; damage to surrounding structures Agreeable to proceed Consent signed andin chart  Thank you for this interesting consult.  I greatly enjoyed meeting [name] and look forward to participating in their  care.  Signed: Sonia Stickels A 02/06/2015, 1:11 PM   I spent a total of 20 minutes face to face in clinical consultation, greater than 50% of which was counseling/coordinating care for Rt cervical LN bx

## 2015-02-15 DIAGNOSIS — C770 Secondary and unspecified malignant neoplasm of lymph nodes of head, face and neck: Secondary | ICD-10-CM | POA: Insufficient documentation

## 2015-02-25 ENCOUNTER — Ambulatory Visit
Admit: 2015-02-25 | Disposition: A | Discharge status: Home / Self Care | Payer: Self-pay | Attending: Oncology | Admitting: Oncology

## 2015-04-02 DIAGNOSIS — C01 Malignant neoplasm of base of tongue: Secondary | ICD-10-CM | POA: Insufficient documentation

## 2015-04-16 DIAGNOSIS — IMO0001 Reserved for inherently not codable concepts without codable children: Secondary | ICD-10-CM | POA: Insufficient documentation

## 2015-04-16 DIAGNOSIS — B3781 Candidal esophagitis: Secondary | ICD-10-CM | POA: Insufficient documentation

## 2015-04-16 DIAGNOSIS — R1314 Dysphagia, pharyngoesophageal phase: Secondary | ICD-10-CM | POA: Insufficient documentation

## 2015-04-18 DIAGNOSIS — R509 Fever, unspecified: Secondary | ICD-10-CM | POA: Insufficient documentation

## 2015-04-18 NOTE — Consult Note (Signed)
   Note Type Consult   HPI: Referred by Dr. Remer Macho , [facility].   This 69 year old Male patient presents to the clinic for follow up (1)Orencia infusion for rheumatoid arthritis.  Subjective: Chief Complaint/Diagnosis:   Orencia infusion for rheumatoid arthritis. HPI:   Patient presents to clinic today as routine six-month followup.  He continues to receive his Orencia infusions for rheumatoid arthritis every 4 weeks without significant side effects.  Patient states he is going to discuss with his rheumatologist about increasing the dose, because he states the treatment seems to wear off in about 3 weeks.  He had no recent fevers or illnesses.  He has no new medications.  Patient feels at his baseline and offers no specific complaints today.    Review of Systems:  General: denies complaints  Performance Status (ECOG): 0  Review of Systems:   As per HPI. Otherwise, 10 point system review was negative.   Allergies:  No Known Allergies:   PFSH: Additional Past Medical and Surgical History: rheumatoid arthritis, one ulcer, esophageal shifter, diverticulitis, right Heaney colectomy, ORIF, cholecystectomy.    Family history: Negative and noncontributory.  Next    Social history: Patient denies tobacco or alcohol.   Home Medications: Medication Instructions Last Modified Date/Time  methotrexate 2.5 mg oral tablet 5  orally once a day 12-Feb-14 08:51  Diovan 320 mg oral tablet 2 tab(s) po  once a day  12-Feb-14 08:51  orencia  47-QQV-95 63:87  folic acid  56-EPP-29 51:88  lipitor 30mg    once a day  12-Feb-14 08:51  Nexium 30mg    once a day  12-Feb-14 08:51  Prednisone 5mg    once a day  12-Feb-14 08:51   Vital Signs:  :: Wt(KG): 90.9 Temp: 96.3 Pulse: 81 RR: 20  BP: 119/61   Physical Exam:  General: well developed, well nourished, and in no acute distress  Mental Status: normal affect  Eyes: anicteric sclera  Respiratory: clear to auscultation bilaterally   Cardiovascular: regular rate and rhythm, no murmur, rub, or gallop  Gastrointestinal: soft, normal active bowel sounds  Musculoskeletal: No edema  Skin: No rash or petechiae noted  Neurological: alert, answering all questions appropriately.  Cranial nerves grossly intact   Assessment and Plan: Impression:   Orencia infusion for rheumatoid arthritis Plan:   1.  RA:  Continue with Orencia 750 mg IV q 4 weeks per Dr. Remer Macho orders.  Patient will require new orders in July 2014.  Patient understands that if he has questions or concerns regarding his rheumatoid arthritis or Orencia he should defer them his primary rheumatologist.  All laboratory work will also be monitored by his rheumatologist.  Patient expressed understanding and was in agreement with this plan.   CC Referral:  cc: Dr. Remer Macho   Electronic Signatures: Delight Hoh (MD)  (Signed 12-Feb-14 13:04)  Authored: Note Type, History of Present Illness, CC/HPI, Review of Systems, ALLERGIES, Patient Family Social History, HOME MEDICATIONS, Vital Signs, Physical Exam, Assessment and Plan, CC Referring Physician   Last Updated: 12-Feb-14 13:04 by Delight Hoh (MD)  References: 1.  Data Referenced From "[facility] Nurse Note" 07-Feb-2013 8:49 AM

## 2015-04-18 NOTE — Consult Note (Signed)
   Note Type Consult   HPI: Referred by Dr. Remer Macho , [facility].   This 69 year old Male patient presents to the clinic for follow up  Orencia infusion for rheumatoid arthritis.  Subjective: Chief Complaint/Diagnosis:   Orencia infusion for rheumatoid arthritis. HPI:   Patient presents to clinic today as routine six-month followup.  He continues to receive his Orencia infusions for rheumatoid arthritis every 4 weeks without significant side effects.  He currently feels well and is at his baseline.  He had no recent fevers or illnesses.  He has no new medications.  Patient offers no specific complaints today.    Review of Systems:  General: denies complaints  Performance Status (ECOG): 0  Review of Systems:   As per HPI. Otherwise, 10 point system review was negative.   Allergies:  No Known Allergies:   PFSH: Additional Past Medical and Surgical History: rheumatoid arthritis, one ulcer, esophageal shifter, diverticulitis, right Heaney colectomy, ORIF, cholecystectomy.    Family history: Negative and noncontributory.  Next    Social history: Patient denies tobacco or alcohol.   Home Medications: Medication Instructions Last Modified Date/Time  methotrexate 2.5 mg oral tablet 5  orally once a day 12-Feb-14 08:51  Diovan 320 mg oral tablet 2 tab(s) po  once a day  12-Feb-14 08:51  orencia  41-DQQ-22 97:98  folic acid  92-JJH-41 74:08  lipitor 30mg    once a day  12-Feb-14 08:51  Nexium 30mg    once a day  12-Feb-14 08:51  Prednisone 5mg    once a day  12-Feb-14 08:51   Vital Signs:  :: vital signs stable, patient afebrile.   Physical Exam:  General: well developed, well nourished, and in no acute distress  Mental Status: normal affect  Eyes: anicteric sclera  Respiratory: clear to auscultation bilaterally  Cardiovascular: regular rate and rhythm, no murmur, rub, or gallop  Gastrointestinal: soft, normal active bowel sounds  Musculoskeletal: No edema  Skin: No rash  or petechiae noted  Neurological: alert, answering all questions appropriately.  Cranial nerves grossly intact   Assessment and Plan: Impression:   Orencia infusion for rheumatoid arthritis Plan:   1.  RA:  Continue with Orencia 750 mg IV q 4 weeks per Dr. Remer Macho orders. Patient understands that if he has questions or concerns regarding his rheumatoid arthritis or Orencia he should defer them his primary rheumatologist.  All laboratory work will also be monitored by his rheumatologist.  Patient expressed understanding and was in agreement with this plan.   CC Referral:  cc: Dr. Remer Macho   Electronic Signatures: Delight Hoh (MD)  (Signed 10-Sep-14 14:11)  Authored: Note Type, History of Present Illness, CC/HPI, Review of Systems, ALLERGIES, Patient Family Social History, HOME MEDICATIONS, Vital Signs, Physical Exam, Assessment and Plan, CC Referring Physician   Last Updated: 10-Sep-14 14:11 by Delight Hoh (MD)

## 2015-04-19 NOTE — Consult Note (Signed)
   Note Type Consult   HPI: Referred by Dr. Remer Macho , [facility].   This 69 year old Male patient presents to the clinic for follow up  Orencia infusion for rheumatoid arthritis.  Subjective: Chief Complaint/Diagnosis:   Orencia infusion for rheumatoid arthritis. HPI:   Patient presents to clinic today as routine six-month followup.  He continues to receive his Orencia infusions for rheumatoid arthritis every 4 weeks without significant side effects.  He currently feels well and is at his baseline.  He had no recent fevers or illnesses.  He has no new medications.  Patient offers no specific complaints today.    Review of Systems:  General: denies complaints  Performance Status (ECOG): 0  Review of Systems:   As per HPI. Otherwise, 10 point system review was negative.   Allergies:  No Known Allergies:   PFSH: Additional Past Medical and Surgical History: rheumatoid arthritis, diverticulitis, right partial colectomy, ORIF, cholecystectomy.    Family history: Negative and noncontributory.    Social history: Patient denies tobacco or alcohol.   Home Medications: Medication Instructions Last Modified Date/Time  methotrexate 2.5 mg oral tablet 12.5  orally once a week 27-May-15 10:35  Diovan 320 mg oral tablet 1 tab(s)  once a day 27-May-15 10:35  orencia  27-May-15 10:35  lipitor 30mg    once a day  27-May-15 10:35  Prednisone 5mg    once a day  29-FAO-13 08:65  folic acid 1   once a day 27-May-15 10:35  omeprazole 40 mg oral delayed release capsule 1  orally 2 times a day 27-May-15 10:35  Cialis 5 mg oral tablet 1 tab(s) orally once a day for his esophagus 27-May-15 10:35  Vitamin D3 2000 intl units oral capsule 1 cap(s) orally once a day 27-May-15 10:35   Vital Signs:  :: Wt(KG): 90.7 Temp: 97.9 Pulse: 70 RR: 20 O2 Sat: 96  BP: 139/68   Physical Exam:  General: well developed, well nourished, and in no acute distress  Mental Status: normal affect  Eyes: anicteric  sclera  Respiratory: clear to auscultation bilaterally  Cardiovascular: regular rate and rhythm, no murmur, rub, or gallop  Gastrointestinal: soft, normal active bowel sounds  Musculoskeletal: No edema  Skin: No rash or petechiae noted  Neurological: alert, answering all questions appropriately.  Cranial nerves grossly intact   Assessment and Plan: Impression:   Orencia infusion for rheumatoid arthritis Plan:   1.  RA:  Continue with Orencia 750 mg IV q 4 weeks per Dr. Remer Macho orders. Patient understands that if he has questions or concerns regarding his rheumatoid arthritis or Orencia he should defer them his primary rheumatologist.  All laboratory work will also be monitored by his rheumatologist.  Patient expressed understanding and was in agreement with this plan.   CC Referral:  cc: Dr. Remer Macho @ [facility]   Electronic Signatures: Delight Hoh (MD)  (Signed 27-May-15 12:53)  Authored: Note Type, History of Present Illness, CC/HPI, Review of Systems, ALLERGIES, Patient Family Social History, HOME MEDICATIONS, Vital Signs, Physical Exam, Assessment and Plan, CC Referring Physician   Last Updated: 27-May-15 12:53 by Delight Hoh (MD)

## 2015-04-22 DIAGNOSIS — R000 Tachycardia, unspecified: Secondary | ICD-10-CM | POA: Insufficient documentation

## 2015-04-22 DIAGNOSIS — R4182 Altered mental status, unspecified: Secondary | ICD-10-CM | POA: Insufficient documentation

## 2015-05-12 DIAGNOSIS — Z9229 Personal history of other drug therapy: Secondary | ICD-10-CM | POA: Insufficient documentation

## 2015-05-22 DIAGNOSIS — D049 Carcinoma in situ of skin, unspecified: Secondary | ICD-10-CM | POA: Insufficient documentation

## 2015-05-28 ENCOUNTER — Emergency Department (HOSPITAL_COMMUNITY)
Admission: EM | Admit: 2015-05-28 | Discharge: 2015-05-29 | Disposition: A | Discharge status: Other Acute Inpatient Hospital | Payer: Medicare Other | Attending: Emergency Medicine | Admitting: Emergency Medicine

## 2015-05-28 ENCOUNTER — Emergency Department (HOSPITAL_COMMUNITY): Payer: Medicare Other

## 2015-05-28 ENCOUNTER — Encounter (HOSPITAL_COMMUNITY): Payer: Self-pay | Admitting: Radiology

## 2015-05-28 DIAGNOSIS — K219 Gastro-esophageal reflux disease without esophagitis: Secondary | ICD-10-CM | POA: Diagnosis not present

## 2015-05-28 DIAGNOSIS — Z7951 Long term (current) use of inhaled steroids: Secondary | ICD-10-CM | POA: Diagnosis not present

## 2015-05-28 DIAGNOSIS — R531 Weakness: Secondary | ICD-10-CM | POA: Insufficient documentation

## 2015-05-28 DIAGNOSIS — E119 Type 2 diabetes mellitus without complications: Secondary | ICD-10-CM | POA: Insufficient documentation

## 2015-05-28 DIAGNOSIS — Z85828 Personal history of other malignant neoplasm of skin: Secondary | ICD-10-CM | POA: Insufficient documentation

## 2015-05-28 DIAGNOSIS — R112 Nausea with vomiting, unspecified: Secondary | ICD-10-CM | POA: Diagnosis not present

## 2015-05-28 DIAGNOSIS — Z7982 Long term (current) use of aspirin: Secondary | ICD-10-CM | POA: Insufficient documentation

## 2015-05-28 DIAGNOSIS — R1084 Generalized abdominal pain: Secondary | ICD-10-CM | POA: Insufficient documentation

## 2015-05-28 DIAGNOSIS — R197 Diarrhea, unspecified: Secondary | ICD-10-CM | POA: Insufficient documentation

## 2015-05-28 DIAGNOSIS — Z85810 Personal history of malignant neoplasm of tongue: Secondary | ICD-10-CM | POA: Diagnosis not present

## 2015-05-28 DIAGNOSIS — E785 Hyperlipidemia, unspecified: Secondary | ICD-10-CM | POA: Insufficient documentation

## 2015-05-28 DIAGNOSIS — Z8701 Personal history of pneumonia (recurrent): Secondary | ICD-10-CM | POA: Diagnosis not present

## 2015-05-28 DIAGNOSIS — M069 Rheumatoid arthritis, unspecified: Secondary | ICD-10-CM | POA: Insufficient documentation

## 2015-05-28 DIAGNOSIS — R109 Unspecified abdominal pain: Secondary | ICD-10-CM | POA: Diagnosis present

## 2015-05-28 DIAGNOSIS — Z931 Gastrostomy status: Secondary | ICD-10-CM | POA: Insufficient documentation

## 2015-05-28 DIAGNOSIS — R6883 Chills (without fever): Secondary | ICD-10-CM | POA: Insufficient documentation

## 2015-05-28 DIAGNOSIS — I10 Essential (primary) hypertension: Secondary | ICD-10-CM | POA: Insufficient documentation

## 2015-05-28 DIAGNOSIS — Z79899 Other long term (current) drug therapy: Secondary | ICD-10-CM | POA: Insufficient documentation

## 2015-05-28 DIAGNOSIS — Z7952 Long term (current) use of systemic steroids: Secondary | ICD-10-CM | POA: Insufficient documentation

## 2015-05-28 DIAGNOSIS — G8929 Other chronic pain: Secondary | ICD-10-CM | POA: Insufficient documentation

## 2015-05-28 DIAGNOSIS — Z87891 Personal history of nicotine dependence: Secondary | ICD-10-CM | POA: Diagnosis not present

## 2015-05-28 LAB — URINALYSIS, ROUTINE W REFLEX MICROSCOPIC
Bilirubin Urine: NEGATIVE
Glucose, UA: NEGATIVE mg/dL
Hgb urine dipstick: NEGATIVE
Ketones, ur: NEGATIVE mg/dL
Leukocytes, UA: NEGATIVE
Nitrite: NEGATIVE
Protein, ur: 30 mg/dL — AB
Specific Gravity, Urine: 1.018 (ref 1.005–1.030)
Urobilinogen, UA: 0.2 mg/dL (ref 0.0–1.0)
pH: 8.5 — ABNORMAL HIGH (ref 5.0–8.0)

## 2015-05-28 LAB — I-STAT CG4 LACTIC ACID, ED
LACTIC ACID, VENOUS: 3.04 mmol/L — AB (ref 0.5–2.0)
Lactic Acid, Venous: 1.63 mmol/L (ref 0.5–2.0)

## 2015-05-28 LAB — COMPREHENSIVE METABOLIC PANEL
ALT: 13 U/L — ABNORMAL LOW (ref 17–63)
AST: 18 U/L (ref 15–41)
Albumin: 3.7 g/dL (ref 3.5–5.0)
Alkaline Phosphatase: 72 U/L (ref 38–126)
Anion gap: 14 (ref 5–15)
BUN: 23 mg/dL — ABNORMAL HIGH (ref 6–20)
CO2: 33 mmol/L — ABNORMAL HIGH (ref 22–32)
Calcium: 9.9 mg/dL (ref 8.9–10.3)
Chloride: 92 mmol/L — ABNORMAL LOW (ref 101–111)
Creatinine, Ser: 0.77 mg/dL (ref 0.61–1.24)
GFR calc Af Amer: >60 mL/min (ref >60)
GFR calc non Af Amer: >60 mL/min (ref >60)
Glucose, Bld: 184 mg/dL — ABNORMAL HIGH (ref 65–99)
Potassium: 2.9 mmol/L — ABNORMAL LOW (ref 3.5–5.1)
Sodium: 139 mmol/L (ref 135–145)
Total Bilirubin: 0.8 mg/dL (ref 0.3–1.2)
Total Protein: 6.9 g/dL (ref 6.5–8.1)

## 2015-05-28 LAB — CBC WITH DIFFERENTIAL/PLATELET
Basophils Absolute: 0 10*3/uL (ref 0.0–0.1)
Basophils Relative: 0 % (ref 0–1)
Eosinophils Absolute: 0.1 10*3/uL (ref 0.0–0.7)
Eosinophils Relative: 1 % (ref 0–5)
HCT: 33.5 % — ABNORMAL LOW (ref 39.0–52.0)
Hemoglobin: 11.4 g/dL — ABNORMAL LOW (ref 13.0–17.0)
Lymphocytes Relative: 6 % — ABNORMAL LOW (ref 12–46)
Lymphs Abs: 0.7 10*3/uL (ref 0.7–4.0)
MCH: 31.1 pg (ref 26.0–34.0)
MCHC: 34 g/dL (ref 30.0–36.0)
MCV: 91.3 fL (ref 78.0–100.0)
Monocytes Absolute: 0.8 10*3/uL (ref 0.1–1.0)
Monocytes Relative: 7 % (ref 3–12)
Neutro Abs: 10.1 10*3/uL — ABNORMAL HIGH (ref 1.7–7.7)
Neutrophils Relative %: 86 % — ABNORMAL HIGH (ref 43–77)
Platelets: 173 10*3/uL (ref 150–400)
RBC: 3.67 MIL/uL — ABNORMAL LOW (ref 4.22–5.81)
RDW: 15.3 % (ref 11.5–15.5)
WBC: 11.7 10*3/uL — ABNORMAL HIGH (ref 4.0–10.5)

## 2015-05-28 LAB — OCCULT BLOOD GASTRIC / DUODENUM (SPECIMEN CUP)
OCCULT BLOOD, GASTRIC: POSITIVE — AB
PH, GASTRIC: 2

## 2015-05-28 LAB — LIPASE, BLOOD: Lipase: 25 U/L (ref 22–51)

## 2015-05-28 LAB — URINE MICROSCOPIC-ADD ON

## 2015-05-28 MED ORDER — POTASSIUM CHLORIDE 10 MEQ/100ML IV SOLN
10.0000 meq | Freq: Once | INTRAVENOUS | Status: AC
  Administered 2015-05-28: 10 meq via INTRAVENOUS
  Filled 2015-05-28: qty 100

## 2015-05-28 MED ORDER — HYDROMORPHONE HCL 1 MG/ML IJ SOLN
1.0000 mg | Freq: Once | INTRAMUSCULAR | Status: AC
  Administered 2015-05-28: 1 mg via INTRAVENOUS
  Filled 2015-05-28: qty 1

## 2015-05-28 MED ORDER — SODIUM CHLORIDE 0.9 % IV SOLN
Freq: Once | INTRAVENOUS | Status: AC
  Administered 2015-05-28: 16:00:00 via INTRAVENOUS

## 2015-05-28 MED ORDER — IOHEXOL 300 MG/ML  SOLN
100.0000 mL | Freq: Once | INTRAMUSCULAR | Status: AC | PRN
  Administered 2015-05-28: 100 mL via INTRAVENOUS

## 2015-05-28 MED ORDER — SODIUM CHLORIDE 0.9 % IV BOLUS (SEPSIS)
1000.0000 mL | Freq: Once | INTRAVENOUS | Status: AC
  Administered 2015-05-28: 1000 mL via INTRAVENOUS

## 2015-05-28 MED ORDER — PROMETHAZINE HCL 25 MG/ML IJ SOLN
12.5000 mg | Freq: Once | INTRAMUSCULAR | Status: AC
  Administered 2015-05-28: 12.5 mg via INTRAVENOUS
  Filled 2015-05-28: qty 1

## 2015-05-28 MED ORDER — ACETAMINOPHEN 650 MG RE SUPP
650.0000 mg | Freq: Once | RECTAL | Status: AC
  Administered 2015-05-28: 650 mg via RECTAL
  Filled 2015-05-28: qty 1

## 2015-05-28 MED ORDER — PANTOPRAZOLE SODIUM 40 MG IV SOLR
40.0000 mg | Freq: Once | INTRAVENOUS | Status: AC
  Administered 2015-05-28: 40 mg via INTRAVENOUS
  Filled 2015-05-28: qty 40

## 2015-05-28 MED ORDER — MORPHINE SULFATE 4 MG/ML IJ SOLN
4.0000 mg | Freq: Once | INTRAMUSCULAR | Status: AC
  Administered 2015-05-28: 4 mg via INTRAVENOUS
  Filled 2015-05-28: qty 1

## 2015-05-28 MED ORDER — ONDANSETRON HCL 4 MG/2ML IJ SOLN
4.0000 mg | Freq: Once | INTRAMUSCULAR | Status: AC
  Administered 2015-05-28: 4 mg via INTRAVENOUS
  Filled 2015-05-28: qty 2

## 2015-05-28 MED ORDER — IOHEXOL 300 MG/ML  SOLN
50.0000 mL | Freq: Once | INTRAMUSCULAR | Status: AC | PRN
  Administered 2015-05-28: 50 mL via ORAL

## 2015-05-28 MED ORDER — METOCLOPRAMIDE HCL 5 MG/ML IJ SOLN
10.0000 mg | Freq: Once | INTRAMUSCULAR | Status: AC
  Administered 2015-05-28: 10 mg via INTRAVENOUS
  Filled 2015-05-28: qty 2

## 2015-05-28 NOTE — ED Notes (Addendum)
Per EMS pt with Hx of throat and tongue cancer, g tube placed 4 weeks ago on left mid abdomen, c/o n/v, bilateral lower abdominal pain onset last night, along with weakness, headache, dizziness and chills. Per EMS pt is A & O x 4, lives at home. Pt currently has Fentanyl patch 75 mcg due to be removed in 3 days. Per EMS pt has Hx of multiple aspiration episodes. Pt denies SOB. EMS administered 4 mg zofran IV en route.  On assessment, pt is abnormally drowsy. O2 saturation drops to 84% when pt is sleeping, rises to 95% while patient is awake. Constant stimulation required to keep patient awake. Altered mental status listed as a history on patient's chart from [place].

## 2015-05-28 NOTE — ED Notes (Addendum)
PA Tatyana made aware of high  lactic acid value

## 2015-05-28 NOTE — ED Provider Notes (Signed)
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  Spoke with Dr.Zhang with [facility] oncology consultation patient's case. Dr. Roosevelt Locks agrees to admit patient to oncology service at [facility] for further surgical consults, and further evaluation regarding small bowel obstruction.  PE: Constitutional: well-developed, sickly appearing, uncomfortable. HENT: normocephalic, atraumatic Cardiovascular: normal rate and rhythm, distal pulses intact Pulmonary/Chest: effort normal; breath sounds clear and equal bilaterally; no wheezes or rales Abdominal: Mild, generalized tenderness. Musculoskeletal: full ROM, no edema Lymphadenopathy: no cervical adenopathy Neurological: alert with goal directed thinking Skin: warm and dry, no rash, no diaphoresis Psychiatric: normal mood and affect, normal behavior    On reexam here, patient is in mild discomfort, hemodynamically stable, in no acute distress. Lactate noted to have trended upward, patient has been receiving maintenance fluids along with 2 L of fluid boluses. Patient treated symptomatically until transferred. Patient transferred by Myna Bright to [facility] room [phone].    BP 147/81 mmHg  Pulse 107  Temp(Src) 98.4 F (36.9 C) (Oral)  Resp 18  SpO2 95%  Signed,  Dahlia Bailiff, PA-C 11:57 PM   Dahlia Bailiff, PA-C 05/28/15 2358  Everlene Balls, MD 05/29/15 2300

## 2015-05-28 NOTE — ED Provider Notes (Signed)
CSN: [id number]     Arrival date & time 05/28/15  1320 History   First MD Initiated Contact with Patient 05/28/15 1326     Chief Complaint  Patient presents with  . Abdominal Pain     (Consider location/radiation/quality/duration/timing/severity/associated sxs/prior Treatment) HPI [name] is a 69 y.o. male  With history of hypertension, diabetes , rheumatoid arthritis, squamous cell carcinoma of the tongue, currently followed by [facility] for his medical problems, presents to emergency department complaining of abdominal pain, nausea, vomiting. Pain started last night. Patient  Unable to keep anything down. He reports associated diarrhea. Patient had G abdomen J-tube placed 4 weeks ago  For poor appetite. Also reports multiple episodes of aspiration. Patient is coughing up also throwing up thick mucus and stomach contents. Patient reports chills, no fevers. Abdominal pain is diffuse. Denies any back pain. Denies any other symptoms. No medications taken at home. Patient is on chronic pain medications for his cancer  And rheumatoid arthritis, he is on fentanyl patch and takes morphine, he has not taken any morphine since yesterday. Pt state "this is the worst I have felt in a long time.   Past Medical History  Diagnosis Date  . Hypertension   . Dyslipidemia   . GERD (gastroesophageal reflux disease)   . PONV (postoperative nausea and vomiting)   . Pneumonia     "couple times"  . Type II diabetes mellitus 04/2014 dx'd    "mild case"  . History of blood transfusion 1964  . H/O hiatal hernia   . Rheumatoid arthritis   . Melanoma     "back; left arm"  . Chronic back pain   . Spasm of esophagus    Past Surgical History  Procedure Laterality Date  . Colon surgery  2007    removed 18" of colon  . Tibia fracture surgery Left 2006    "put a steel rod in it"  . Carpal tunnel release Bilateral   . Hand surgery Bilateral     "plastic knuckles"  . Pelvic fracture surgery  1964     "busted in 2 places"  . Nasal septum surgery    . Melanoma excision      "back; left arm"  . Laparoscopic cholecystectomy  01/2006  . Ercp w/ metal stent placement  11/2005    Archie Endo 12/14/2005  . Esophagogastroduodenoscopy (egd) with esophageal dilation      "several times'   Family History  Problem Relation Age of Onset  . Heart disease Mother   . Heart disease Father   . Cancer Maternal Grandmother    History  Substance Use Topics  . Smoking status: Former Smoker -- 1.00 packs/day for 46 years    Types: Cigarettes    Quit date: 11/26/2005  . Smokeless tobacco: Never Used  . Alcohol Use: 5.4 oz/week    9 Shots of liquor per week     Comment: 05/23/2014 "3, 1 shot drinks maybe 3 times/wk"    Review of Systems  Constitutional: Positive for chills. Negative for fever.  Respiratory: Negative for cough, chest tightness and shortness of breath.   Cardiovascular: Negative for chest pain, palpitations and leg swelling.  Gastrointestinal: Positive for nausea, vomiting, abdominal pain and diarrhea. Negative for abdominal distention.  Genitourinary: Negative for dysuria.  Musculoskeletal: Negative for myalgias, arthralgias, neck pain and neck stiffness.  Skin: Negative for rash.  Allergic/Immunologic: Negative for immunocompromised state.  Neurological: Positive for weakness. Negative for dizziness, light-headedness, numbness and headaches.  All other systems  reviewed and are negative.     Allergies  Review of patient's allergies indicates no known allergies.  Home Medications   Prior to Admission medications   Medication Sig Start Date End Date Taking? Authorizing Provider  abatacept (ORENCIA) 250 MG injection Inject 750 mg into the vein every 28 (twenty-eight) days.     Historical Provider, MD  aspirin 325 MG tablet Take 325 mg by mouth daily.    Historical Provider, MD  atorvastatin (LIPITOR) 20 MG tablet Take 20 mg by mouth daily.    Historical Provider, MD  azelastine  (ASTELIN) 137 MCG/SPRAY nasal spray Place 1 spray into the nose 2 (two) times daily. Use in each nostril as directed    Historical Provider, MD  fluticasone (FLONASE) 50 MCG/ACT nasal spray Place 2 sprays into the nose daily.    Historical Provider, MD  folic acid (FOLVITE) 1 MG tablet Take 1 mg by mouth daily. 02/13/10   Historical Provider, MD  irbesartan (AVAPRO) 300 MG tablet Take 300 mg by mouth daily.    Historical Provider, MD  methotrexate (RHEUMATREX) 2.5 MG tablet Take 12.5 mg by mouth once a week. Caution:Chemotherapy. Protect from light.; takes 5 tablets (12.5mg ) weekly on Mondays    Historical Provider, MD  omeprazole (PRILOSEC) 40 MG capsule Take 40 mg by mouth daily.    Historical Provider, MD  oxyCODONE-acetaminophen (ROXICET) 5-325 MG per tablet Take 1-2 tablets by mouth every 4 (four) hours as needed (Pain). 05/24/14   Lisette Abu, PA-C  predniSONE (DELTASONE) 5 MG tablet Take 5 mg by mouth daily.    Historical Provider, MD  tadalafil (CIALIS) 5 MG tablet Take 5 mg by mouth daily.    Historical Provider, MD  traMADol (ULTRAM) 50 MG tablet Take 2 tablets (100 mg total) by mouth every 6 (six) hours. 05/24/14   Lisette Abu, PA-C  valsartan (DIOVAN) 320 MG tablet Take 320 mg by mouth daily. 07/29/10   Historical Provider, MD   BP 144/72 mmHg  Pulse 86  Temp(Src) 98.9 F (37.2 C) (Oral)  Resp 18  SpO2 96% Physical Exam  Constitutional: He appears well-developed and well-nourished. No distress.  HENT:  Head: Normocephalic and atraumatic.  Eyes: Conjunctivae are normal.  Neck: Neck supple.  Cardiovascular: Normal rate, regular rhythm and normal heart sounds.   Pulmonary/Chest: Effort normal. No respiratory distress. He has no wheezes. He has no rales.  Abdominal: Soft. Bowel sounds are normal. He exhibits no distension. There is tenderness. There is no rebound.   Diffuse tenderness. GJ tube in the left abdomen, appears normal. No guarding.  Musculoskeletal: He exhibits  no edema.  Neurological: He is alert.  Skin: Skin is warm and dry.  Nursing note and vitals reviewed.   ED Course  Procedures (including critical care time) Labs Review Labs Reviewed  CBC WITH DIFFERENTIAL/PLATELET - Abnormal; Notable for the following:    WBC 11.7 (*)    RBC 3.67 (*)    Hemoglobin 11.4 (*)    HCT 33.5 (*)    Neutrophils Relative % 86 (*)    Neutro Abs 10.1 (*)    Lymphocytes Relative 6 (*)    All other components within normal limits  COMPREHENSIVE METABOLIC PANEL - Abnormal; Notable for the following:    Potassium 2.9 (*)    Chloride 92 (*)    CO2 33 (*)    Glucose, Bld 184 (*)    BUN 23 (*)    ALT 13 (*)    All other components  within normal limits  LIPASE, BLOOD  URINALYSIS, ROUTINE W REFLEX MICROSCOPIC (NOT AT [facility])  I-STAT CG4 LACTIC ACID, ED    Imaging Review Ct Abdomen Pelvis W Contrast  05/28/2015   CLINICAL DATA:  Abdominal pain post percutaneous gastrojejunal tube placement for weeks ago. History of head and neck carcinoma.  EXAM: CT ABDOMEN AND PELVIS WITH CONTRAST  TECHNIQUE: Multidetector CT imaging of the abdomen and pelvis was performed using the standard protocol following bolus administration of intravenous contrast.  CONTRAST:  159mL OMNIPAQUE IOHEXOL 300 MG/ML  SOLN  COMPARISON:  CT of the abdomen and pelvis on 09/02/2006  FINDINGS: A percutaneous gastrojejunal feeding catheter is present with retention balloon in the body of the stomach and attached jejunal feeding catheter extending into the proximal jejunum. No evidence of free air or abscess along the percutaneous course of the catheter.  There is evidence of gastric distention containing a large amount of fluid and some ingested material. There also is evidence of small bowel dilatation involving the jejunum with maximal small bowel caliber of 3.8 cm. Distal jejunum and ileum are decompressed and findings are suggestive of partial small bowel obstruction. The transition point between dilated  nondilated bowel is identifiable at the level of mid to distal jejunum in the left upper pelvis just to the left of midline. No evidence of bowel perforation or focal abscess. The colon is decompressed.  The liver, spleen, adrenal glands and kidneys are unremarkable. There is some interval enlargement of a medial and posterior interpolar right renal cyst since 2007 with maximal diameter increasing from approximately 2 cm previously up to 3.5 cm. Characteristics of the cyst and internal density measurements remain consistent with a benign cyst. Nonobstructing calculus in the lower pole of the left kidney appears stable compared to the prior study and measures approximately 6 mm.  The pancreas is atrophic. Changes seen previously of pancreatitis have resolved. There is no evidence of visualized pancreatic mass or abnormal fluid collections. The bladder is decompressed. No masses or enlarged lymph nodes seen. No hernias are identified.  Atherosclerosis of the abdominal aorta and iliac arteries again visualized. Caliber of the distal abdominal aorta has increased to 3 x 3.5 cm. 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 No evidence of aneurysm rupture or dissection.  IMPRESSION: 1. Appropriate positioning of percutaneous gastrojejunal feeding tube without evidence of complication after placement. 2. Evidence of partial small bowel obstruction involving the jejunum with clear transition point identified in the left pelvis between dilated and nondilated jejunum. No free air or focal abscess is identified. 3. 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 See measurements above. Elective Vascular Surgical consultation is recommended for follow-up. 4. Increased size of benign appearing right renal cyst  since 2007.   Electronically Signed   By: Aletta Edouard M.D.   On: 05/28/2015 17:03   Dg Abd Acute W/chest  05/28/2015   CLINICAL DATA:  69 year old male with nausea and history of G-tube placement several weeks previously.  EXAM: DG ABDOMEN ACUTE W/ 1V CHEST  COMPARISON:  Prior chest x-ray 05/30/2014  FINDINGS: Cardiac and mediastinal contours remain within normal limits. Background of moderately severe pulmonary emphysema. Linear pleural parenchymal scarring in the left mid lung and oriented vertically in the periphery of the right lung base. A 9 mm  nodular opacity overlies the anterior aspect of the right second rib. No focal airspace consolidation. No pneumothorax or pleural effusion. No acute osseous abnormality.  Opacified gastrostomy balloon projects over the gastric body. There is a J arm extension which appears well position with the tip overlying the proximal small bowel. Two T tacks remain present in the region of the stomach. Surgical clips in the right upper quadrant suggest prior cholecystectomy. Minimal gaseous distension of proximal small bowel. No definite obstruction. No evidence of free air.  IMPRESSION: 1. Well-positioned percutaneous gastrojejunostomy tube. The tip of the J arm overlies the proximal jejunum. 2. Mild gaseous distention of several loops of proximal small bowel without definitive obstruction. 3. Possible 9 mm nodule in the right upper lobe. Consider further evaluation with non emergent (outpatient) CT scan of the chest to confirm. 4. Background of pulmonary pleural-parenchymal scarring and emphysema.   Electronically Signed   By: Jacqulynn Cadet M.D.   On: 05/28/2015 14:45     EKG Interpretation None      MDM   Final diagnoses:  Abdominal pain    2:31 PM  patient seen and examined, patient with nausea, vomiting, diffuse abdominal pain. History of aspiration pneumonia. Patient with G-tube in the left abdomen. Abdomen is diffusely tender. Patient is afebrile,  spitting up thick mucus while in the . Will get labs, chest x-ray and abdominal films, IV fluids and pain medications ordered.  3:30 PM Pt continues to have nausea and vomiting. Complaining of being cold. Rectal temp checked, 99.9. I ordered him tylenol. He continues to receive IV fluids. VS stable.  Will try dilaudid, phenergan IV, CT still pending.   5:36 PM CT showing partial small bowel obstruction. This is most likely the cause of his symptoms. G tube attached to suctioning with large amount of dark stomach contents out. Pt continues to have nausea and pain. Ordered another dose of dilaudid and reglan. Pt wishes to be admitted to [facility]. Will consult.    Pt signed out to [facility] pending admission.   Filed Vitals:   05/28/15 2100 05/28/15 2314 05/28/15 2315 05/28/15 2348  BP: 156/90 147/81  156/85  Pulse:  107 105   Temp:  98.4 F (36.9 C)    TempSrc:  Oral    Resp:  18    SpO2:  95% 97%      Jeannett Senior, PA-C 05/29/15 1602  Virgel Manifold, MD 06/05/15 1354

## 2015-05-28 NOTE — ED Notes (Signed)
PA at bedside.

## 2015-05-28 NOTE — ED Notes (Signed)
Patient's gtube suction equipment leaked gastric fluid in patient's bed. This RN attempted to chang patient's bed and clean him, pt refused change of linen or bath, states "I don't want to be messed with." Pt instructed to inform staff if he changes his mind.

## 2015-05-29 DIAGNOSIS — K56609 Unspecified intestinal obstruction, unspecified as to partial versus complete obstruction: Secondary | ICD-10-CM | POA: Insufficient documentation

## 2015-05-29 DIAGNOSIS — R1084 Generalized abdominal pain: Secondary | ICD-10-CM | POA: Diagnosis not present

## 2015-06-25 DIAGNOSIS — K1231 Oral mucositis (ulcerative) due to antineoplastic therapy: Secondary | ICD-10-CM | POA: Insufficient documentation

## 2015-06-25 DIAGNOSIS — K1233 Oral mucositis (ulcerative) due to radiation: Secondary | ICD-10-CM | POA: Insufficient documentation

## 2015-07-16 ENCOUNTER — Telehealth: Payer: Self-pay | Admitting: Oncology

## 2015-07-16 NOTE — Telephone Encounter (Signed)
Orencia infusions - he has not had one in a long time.

## 2015-07-22 ENCOUNTER — Other Ambulatory Visit: Payer: Self-pay | Admitting: Oncology

## 2015-07-22 DIAGNOSIS — M069 Rheumatoid arthritis, unspecified: Secondary | ICD-10-CM | POA: Insufficient documentation

## 2015-07-25 ENCOUNTER — Inpatient Hospital Stay: Payer: Medicare Other

## 2015-07-25 ENCOUNTER — Inpatient Hospital Stay: Payer: Medicare Other | Attending: Oncology | Admitting: Oncology

## 2015-07-25 VITALS — BP 146/74 | HR 76 | Temp 97.3°F | Resp 16 | Wt 163.9 lb

## 2015-07-25 VITALS — BP 119/75 | HR 70 | Temp 97.8°F | Resp 16

## 2015-07-25 DIAGNOSIS — E119 Type 2 diabetes mellitus without complications: Secondary | ICD-10-CM | POA: Diagnosis not present

## 2015-07-25 DIAGNOSIS — R531 Weakness: Secondary | ICD-10-CM | POA: Diagnosis not present

## 2015-07-25 DIAGNOSIS — K219 Gastro-esophageal reflux disease without esophagitis: Secondary | ICD-10-CM | POA: Diagnosis not present

## 2015-07-25 DIAGNOSIS — I10 Essential (primary) hypertension: Secondary | ICD-10-CM

## 2015-07-25 DIAGNOSIS — M549 Dorsalgia, unspecified: Secondary | ICD-10-CM | POA: Insufficient documentation

## 2015-07-25 DIAGNOSIS — M069 Rheumatoid arthritis, unspecified: Secondary | ICD-10-CM

## 2015-07-25 DIAGNOSIS — Z87891 Personal history of nicotine dependence: Secondary | ICD-10-CM | POA: Diagnosis not present

## 2015-07-25 DIAGNOSIS — Z8701 Personal history of pneumonia (recurrent): Secondary | ICD-10-CM | POA: Insufficient documentation

## 2015-07-25 DIAGNOSIS — E785 Hyperlipidemia, unspecified: Secondary | ICD-10-CM | POA: Insufficient documentation

## 2015-07-25 DIAGNOSIS — K449 Diaphragmatic hernia without obstruction or gangrene: Secondary | ICD-10-CM | POA: Insufficient documentation

## 2015-07-25 DIAGNOSIS — R5383 Other fatigue: Secondary | ICD-10-CM | POA: Diagnosis not present

## 2015-07-25 DIAGNOSIS — C61 Malignant neoplasm of prostate: Secondary | ICD-10-CM | POA: Insufficient documentation

## 2015-07-25 DIAGNOSIS — Z7952 Long term (current) use of systemic steroids: Secondary | ICD-10-CM | POA: Diagnosis not present

## 2015-07-25 DIAGNOSIS — Z85820 Personal history of malignant melanoma of skin: Secondary | ICD-10-CM | POA: Insufficient documentation

## 2015-07-25 DIAGNOSIS — Z79899 Other long term (current) drug therapy: Secondary | ICD-10-CM | POA: Diagnosis not present

## 2015-07-25 DIAGNOSIS — Z7982 Long term (current) use of aspirin: Secondary | ICD-10-CM | POA: Insufficient documentation

## 2015-07-25 MED ORDER — SODIUM CHLORIDE 0.9 % IV SOLN
1000.0000 mg | Freq: Once | INTRAVENOUS | Status: AC
  Administered 2015-07-25: 1000 mg via INTRAVENOUS
  Filled 2015-07-25: qty 40

## 2015-07-25 MED ORDER — SODIUM CHLORIDE 0.9 % IV SOLN
Freq: Once | INTRAVENOUS | Status: AC
  Administered 2015-07-25: 10:00:00 via INTRAVENOUS
  Filled 2015-07-25: qty 1000

## 2015-07-31 NOTE — Progress Notes (Signed)
[place]  Telephone:(336) [phone] Fax:(336) [phone]  ID: [name] OB: [birth year]  MR#: [id number]  JOI#:[id number]  Patient Care Team: Hulan Fess, MD as PCP - General (Family Medicine)  CHIEF COMPLAINT:  Chief Complaint  Patient presents with  . Follow-up    Is scheduled for Orencia today; states that he will start oral Methotrexate on Monday..    INTERVAL HISTORY: Patient presents to clinic today as routine six-month followup.  He he has not received Orencia recently secondary to completing treatment for newly diagnosed prostate cancer at [facility]. He returns to clinic today to resume treatments every 4 weeks. He multiple complaints that are unrelated to his rheumatoid arthritis or his infusions.  He currently feels well and is at his baseline.  He had no recent fevers or illnesses.  He has no new medications.  Patient offers no further specific complaints today.    REVIEW OF SYSTEMS:   Review of Systems  Constitutional: Positive for malaise/fatigue. Negative for fever.  Respiratory: Negative.   Cardiovascular: Negative.   Gastrointestinal: Negative.   Genitourinary: Negative.   Neurological: Positive for weakness.    As per HPI. Otherwise, a complete review of systems is negatve.  PAST MEDICAL HISTORY: Past Medical History  Diagnosis Date  . Hypertension   . Dyslipidemia   . GERD (gastroesophageal reflux disease)   . PONV (postoperative nausea and vomiting)   . Pneumonia     "couple times"  . Type II diabetes mellitus 04/2014 dx'd    "mild case"  . History of blood transfusion 1964  . H/O hiatal hernia   . Rheumatoid arthritis   . Melanoma     "back; left arm"  . Chronic back pain   . Spasm of esophagus     PAST SURGICAL HISTORY: Past Surgical History  Procedure Laterality Date  . Colon surgery  2007    removed 18" of colon  . Tibia fracture surgery Left 2006    "put a steel rod in it"  . Carpal tunnel release  Bilateral   . Hand surgery Bilateral     "plastic knuckles"  . Pelvic fracture surgery  1964    "busted in 2 places"  . Nasal septum surgery    . Melanoma excision      "back; left arm"  . Laparoscopic cholecystectomy  01/2006  . Ercp w/ metal stent placement  11/2005    Archie Endo 12/14/2005  . Esophagogastroduodenoscopy (egd) with esophageal dilation      "several times'    FAMILY HISTORY Family History  Problem Relation Age of Onset  . Heart disease Mother   . Heart disease Father   . Cancer Maternal Grandmother        ADVANCED DIRECTIVES:    HEALTH MAINTENANCE: History  Substance Use Topics  . Smoking status: Former Smoker -- 1.00 packs/day for 46 years    Types: Cigarettes    Quit date: 11/26/2005  . Smokeless tobacco: Never Used  . Alcohol Use: 5.4 oz/week    9 Shots of liquor per week     Comment: 05/23/2014 "3, 1 shot drinks maybe 3 times/wk"     Colonoscopy:  PAP:  Bone density:  Lipid panel:  No Known Allergies  Current Outpatient Prescriptions  Medication Sig Dispense Refill  . acetaminophen (TYLENOL) 160 MG/5ML liquid Take 500 mg by mouth every 4 (four) hours as needed for fever.    . ARTIFICIAL TEAR OINTMENT OP Apply 1 application to eye as needed. For dry  eyes    . aspirin 325 MG tablet Take 325 mg by mouth daily.    Marland Kitchen atorvastatin (LIPITOR) 20 MG tablet Take 20 mg by mouth daily.    Marland Kitchen azelastine (ASTELIN) 137 MCG/SPRAY nasal spray Place 1 spray into the nose 2 (two) times daily. Use in each nostril as directed    . famotidine (PEPCID) 20 MG tablet Take 20 mg by mouth 2 (two) times daily.    . fluticasone (FLONASE) 50 MCG/ACT nasal spray Place 2 sprays into the nose daily.    . folic acid (FOLVITE) 1 MG tablet Take 1 mg by mouth daily.    . magnesium oxide (MAG-OX) 400 MG tablet Take 800 mg by mouth 3 (three) times daily.    Marland Kitchen oxyCODONE (OXY IR/ROXICODONE) 5 MG immediate release tablet Take 1-3 tablets for pain as needed every 3-4 hours Earliest Fill  Date: 07/16/15    . predniSONE (DELTASONE) 5 MG tablet Take 5 mg by mouth daily with breakfast.    . prochlorperazine (COMPAZINE) 10 MG tablet Take 10 mg by mouth every 6 (six) hours as needed for nausea or vomiting.    . tadalafil (CIALIS) 5 MG tablet Take 5 mg by mouth daily.    . traMADol (ULTRAM) 50 MG tablet Take 2 tablets (100 mg total) by mouth every 6 (six) hours. 100 tablet 0  . white petrolatum (VASELINE) GEL Apply 1 application topically as needed for lip care or dry skin.    Marland Kitchen white petrolatum ointment Place 1 application into both eyes as needed (ever hour as needed for dry eyes).    . morphine 10 MG/5ML solution Take 6-12 mg by mouth every 3 (three) hours as needed for severe pain.    Marland Kitchen omeprazole (PRILOSEC) 20 MG capsule     . oxyCODONE-acetaminophen (ROXICET) 5-325 MG per tablet Take 1-2 tablets by mouth every 4 (four) hours as needed (Pain). (Patient not taking: Reported on 05/28/2015) 60 tablet 0   No current facility-administered medications for this visit.    OBJECTIVE: Filed Vitals:   07/25/15 0936  BP: 146/74  Pulse: 76  Temp: 97.3 F (36.3 C)  Resp: 16     Body mass index is 21.04 kg/(m^2).    ECOG FS:0 - Asymptomatic  General: Well-developed, well-nourished, no acute distress. Eyes: Pink conjunctiva, anicteric sclera. Lungs: Clear to auscultation bilaterally. Heart: Regular rate and rhythm. No rubs, murmurs, or gallops. Abdomen: Soft, nontender, nondistended. No organomegaly noted, normoactive bowel sounds. Musculoskeletal: No edema, cyanosis, or clubbing. Neuro: Alert, answering all questions appropriately. Cranial nerves grossly intact. Skin: No rashes or petechiae noted. Psych: Normal affect.   LAB RESULTS:  Lab Results  Component Value Date   NA 139 05/28/2015   K 2.9* 05/28/2015   CL 92* 05/28/2015   CO2 33* 05/28/2015   GLUCOSE 184* 05/28/2015   BUN 23* 05/28/2015   CREATININE 0.77 05/28/2015   CALCIUM 9.9 05/28/2015   PROT 6.9 05/28/2015    ALBUMIN 3.7 05/28/2015   AST 18 05/28/2015   ALT 13* 05/28/2015   ALKPHOS 72 05/28/2015   BILITOT 0.8 05/28/2015   GFRNONAA >60 05/28/2015   GFRAA >60 05/28/2015    Lab Results  Component Value Date   WBC 11.7* 05/28/2015   NEUTROABS 10.1* 05/28/2015   HGB 11.4* 05/28/2015   HCT 33.5* 05/28/2015   MCV 91.3 05/28/2015   PLT 173 05/28/2015     STUDIES: No results found.  ASSESSMENT: Orencia infusion for rheumatoid arthritis  PLAN:  1.  RA:  Continue with Orencia 1000 mg IV q 4 weeks per Dr. Francella Solian. Clair's orders. Patient will also reinitiate his methotrexate on Monday.  Patient understands that if he has questions or concerns regarding his rheumatoid arthritis or Orencia he should defer them his primary rheumatologist.  All laboratory work will also be monitored by his rheumatologist.  Return to clinic in 6 months for routine evaluation.    Patient expressed understanding and was in agreement with this plan. He also understands that He can call clinic at any time with any questions, concerns, or complaints.    Lloyd Huger, MD   07/31/2015 3:03 PM

## 2015-08-22 ENCOUNTER — Inpatient Hospital Stay: Payer: Medicare Other | Attending: Oncology

## 2015-08-22 ENCOUNTER — Ambulatory Visit: Payer: Medicare Other

## 2015-09-03 ENCOUNTER — Inpatient Hospital Stay: Payer: Medicare Other | Attending: Oncology

## 2015-09-03 VITALS — BP 138/75 | HR 57 | Temp 97.5°F | Resp 20

## 2015-09-03 DIAGNOSIS — M069 Rheumatoid arthritis, unspecified: Secondary | ICD-10-CM | POA: Insufficient documentation

## 2015-09-03 DIAGNOSIS — Z79899 Other long term (current) drug therapy: Secondary | ICD-10-CM | POA: Diagnosis not present

## 2015-09-03 MED ORDER — SODIUM CHLORIDE 0.9 % IV SOLN
Freq: Once | INTRAVENOUS | Status: AC
  Administered 2015-09-03: 10:00:00 via INTRAVENOUS
  Filled 2015-09-03: qty 1000

## 2015-09-03 MED ORDER — ABATACEPT 250 MG IV SOLR
1000.0000 mg | Freq: Once | INTRAVENOUS | Status: AC
  Administered 2015-09-03: 1000 mg via INTRAVENOUS
  Filled 2015-09-03: qty 40

## 2015-09-12 ENCOUNTER — Telehealth: Payer: Self-pay | Admitting: Pharmacist

## 2015-09-12 NOTE — Telephone Encounter (Signed)
Patient is on schedule for 09/19/15 for Orencia. Infusion. Patient has not received in quite some time. Messaged Mickel Baas Diab to reach out to patient and confirm if he is coming or not for his infusion. We Will need to get a new rx if he is planning to continue with therapy.

## 2015-09-19 ENCOUNTER — Ambulatory Visit: Payer: Medicare Other

## 2015-10-14 ENCOUNTER — Inpatient Hospital Stay: Payer: Medicare Other | Attending: Oncology

## 2015-10-14 VITALS — BP 126/81 | HR 67 | Temp 97.0°F | Resp 20

## 2015-10-14 DIAGNOSIS — M069 Rheumatoid arthritis, unspecified: Secondary | ICD-10-CM | POA: Diagnosis not present

## 2015-10-14 DIAGNOSIS — Z79899 Other long term (current) drug therapy: Secondary | ICD-10-CM | POA: Insufficient documentation

## 2015-10-14 DIAGNOSIS — M05719 Rheumatoid arthritis with rheumatoid factor of unspecified shoulder without organ or systems involvement: Secondary | ICD-10-CM

## 2015-10-14 MED ORDER — SODIUM CHLORIDE 0.9 % IV SOLN
Freq: Once | INTRAVENOUS | Status: AC
  Administered 2015-10-14: 10:00:00 via INTRAVENOUS
  Filled 2015-10-14: qty 1000

## 2015-10-14 MED ORDER — ABATACEPT 250 MG IV SOLR
1000.0000 mg | Freq: Once | INTRAVENOUS | Status: AC
Start: 2015-10-14 — End: 2015-10-14
  Administered 2015-10-14: 1000 mg via INTRAVENOUS
  Filled 2015-10-14: qty 40

## 2015-10-17 ENCOUNTER — Ambulatory Visit: Payer: Medicare Other

## 2015-11-14 ENCOUNTER — Ambulatory Visit: Payer: Medicare Other

## 2015-11-14 ENCOUNTER — Inpatient Hospital Stay: Payer: Medicare Other | Attending: Oncology

## 2015-11-14 VITALS — BP 129/62 | HR 67 | Temp 97.5°F

## 2015-11-14 DIAGNOSIS — M069 Rheumatoid arthritis, unspecified: Secondary | ICD-10-CM

## 2015-11-14 DIAGNOSIS — Z79899 Other long term (current) drug therapy: Secondary | ICD-10-CM | POA: Diagnosis not present

## 2015-11-14 MED ORDER — SODIUM CHLORIDE 0.9 % IV SOLN
Freq: Once | INTRAVENOUS | Status: AC
  Administered 2015-11-14: 10:00:00 via INTRAVENOUS
  Filled 2015-11-14: qty 1000

## 2015-11-14 MED ORDER — SODIUM CHLORIDE 0.9 % IV SOLN
1000.0000 mg | Freq: Once | INTRAVENOUS | Status: AC
  Administered 2015-11-14: 1000 mg via INTRAVENOUS
  Filled 2015-11-14: qty 40

## 2015-12-12 ENCOUNTER — Inpatient Hospital Stay: Payer: Medicare Other | Attending: Oncology

## 2015-12-12 ENCOUNTER — Ambulatory Visit: Payer: Medicare Other

## 2015-12-12 VITALS — BP 132/65 | HR 67 | Temp 97.6°F

## 2015-12-12 DIAGNOSIS — M069 Rheumatoid arthritis, unspecified: Secondary | ICD-10-CM | POA: Insufficient documentation

## 2015-12-12 DIAGNOSIS — M05752 Rheumatoid arthritis with rheumatoid factor of left hip without organ or systems involvement: Secondary | ICD-10-CM

## 2015-12-12 DIAGNOSIS — M05751 Rheumatoid arthritis with rheumatoid factor of right hip without organ or systems involvement: Secondary | ICD-10-CM

## 2015-12-12 DIAGNOSIS — Z79899 Other long term (current) drug therapy: Secondary | ICD-10-CM | POA: Diagnosis not present

## 2015-12-12 MED ORDER — SODIUM CHLORIDE 0.9 % IV SOLN
Freq: Once | INTRAVENOUS | Status: AC
  Administered 2015-12-12: 10:00:00 via INTRAVENOUS
  Filled 2015-12-12: qty 1000

## 2015-12-12 MED ORDER — SODIUM CHLORIDE 0.9 % IV SOLN
1000.0000 mg | Freq: Once | INTRAVENOUS | Status: AC
  Administered 2015-12-12: 1000 mg via INTRAVENOUS
  Filled 2015-12-12: qty 40

## 2016-01-09 ENCOUNTER — Ambulatory Visit: Payer: Medicare Other

## 2016-01-09 ENCOUNTER — Ambulatory Visit: Payer: Medicare Other | Admitting: Oncology

## 2016-01-16 ENCOUNTER — Inpatient Hospital Stay: Payer: Medicare Other

## 2016-01-16 ENCOUNTER — Inpatient Hospital Stay: Payer: Medicare Other | Attending: Oncology | Admitting: Oncology

## 2016-01-16 VITALS — BP 138/84 | HR 76 | Temp 97.6°F | Resp 20

## 2016-01-16 VITALS — BP 146/66 | HR 67 | Temp 97.3°F | Resp 18 | Wt 179.7 lb

## 2016-01-16 DIAGNOSIS — M05742 Rheumatoid arthritis with rheumatoid factor of left hand without organ or systems involvement: Principal | ICD-10-CM

## 2016-01-16 DIAGNOSIS — I10 Essential (primary) hypertension: Secondary | ICD-10-CM | POA: Diagnosis not present

## 2016-01-16 DIAGNOSIS — Z7982 Long term (current) use of aspirin: Secondary | ICD-10-CM

## 2016-01-16 DIAGNOSIS — K219 Gastro-esophageal reflux disease without esophagitis: Secondary | ICD-10-CM

## 2016-01-16 DIAGNOSIS — M069 Rheumatoid arthritis, unspecified: Secondary | ICD-10-CM

## 2016-01-16 DIAGNOSIS — Z79899 Other long term (current) drug therapy: Secondary | ICD-10-CM

## 2016-01-16 DIAGNOSIS — E119 Type 2 diabetes mellitus without complications: Secondary | ICD-10-CM | POA: Diagnosis not present

## 2016-01-16 DIAGNOSIS — M549 Dorsalgia, unspecified: Secondary | ICD-10-CM | POA: Diagnosis not present

## 2016-01-16 DIAGNOSIS — E785 Hyperlipidemia, unspecified: Secondary | ICD-10-CM | POA: Diagnosis not present

## 2016-01-16 DIAGNOSIS — Z85820 Personal history of malignant melanoma of skin: Secondary | ICD-10-CM | POA: Diagnosis not present

## 2016-01-16 DIAGNOSIS — G8929 Other chronic pain: Secondary | ICD-10-CM

## 2016-01-16 DIAGNOSIS — M05741 Rheumatoid arthritis with rheumatoid factor of right hand without organ or systems involvement: Secondary | ICD-10-CM

## 2016-01-16 DIAGNOSIS — Z7952 Long term (current) use of systemic steroids: Secondary | ICD-10-CM

## 2016-01-16 DIAGNOSIS — Z87891 Personal history of nicotine dependence: Secondary | ICD-10-CM | POA: Diagnosis not present

## 2016-01-16 MED ORDER — SODIUM CHLORIDE 0.9 % IV SOLN
Freq: Once | INTRAVENOUS | Status: AC
Start: 2016-01-16 — End: 2016-01-16
  Administered 2016-01-16: 10:00:00 via INTRAVENOUS
  Filled 2016-01-16: qty 1000

## 2016-01-16 MED ORDER — SODIUM CHLORIDE 0.9 % IV SOLN
1000.0000 mg | Freq: Once | INTRAVENOUS | Status: AC
  Administered 2016-01-16: 1000 mg via INTRAVENOUS
  Filled 2016-01-16: qty 40

## 2016-01-16 NOTE — Progress Notes (Signed)
[place]  Telephone:(336) [phone] Fax:(336) [phone]  ID: [name] OB: [birth year]  MR#: [id number]  [id number]  Patient Care Team: Hulan Fess, MD as PCP - General (Family Medicine)  CHIEF COMPLAINT:  Chief Complaint  Patient presents with  . Rheumatoid Arthritis    Orencia infusion    INTERVAL HISTORY: Patient presents to clinic today as routine six-month followup.  He continues to take Orencia every 4 weeks with good control of his pain from rheumatoid arthritis. He currently feels well and is asymptomatic.  He had no recent fevers or illnesses.  He has no new medications.  Patient offers no further specific complaints today.    REVIEW OF SYSTEMS:   Review of Systems  Constitutional: Negative for fever and malaise/fatigue.  Respiratory: Negative.   Cardiovascular: Negative.   Gastrointestinal: Negative.   Genitourinary: Negative.   Musculoskeletal: Negative.  Negative for back pain, joint pain and neck pain.  Neurological: Negative.  Negative for weakness.    As per HPI. Otherwise, a complete review of systems is negatve.  PAST MEDICAL HISTORY: Past Medical History  Diagnosis Date  . Hypertension   . Dyslipidemia   . GERD (gastroesophageal reflux disease)   . PONV (postoperative nausea and vomiting)   . Pneumonia     "couple times"  . Type II diabetes mellitus 04/2014 dx'd    "mild case"  . History of blood transfusion 1964  . H/O hiatal hernia   . Rheumatoid arthritis   . Melanoma     "back; left arm"  . Chronic back pain   . Spasm of esophagus     PAST SURGICAL HISTORY: Past Surgical History  Procedure Laterality Date  . Colon surgery  2007    removed 18" of colon  . Tibia fracture surgery Left 2006    "put a steel rod in it"  . Carpal tunnel release Bilateral   . Hand surgery Bilateral     "plastic knuckles"  . Pelvic fracture surgery  1964    "busted in 2 places"  . Nasal septum surgery    . Melanoma excision       "back; left arm"  . Laparoscopic cholecystectomy  01/2006  . Ercp w/ metal stent placement  11/2005    Archie Endo 12/14/2005  . Esophagogastroduodenoscopy (egd) with esophageal dilation      "several times'    FAMILY HISTORY Family History  Problem Relation Age of Onset  . Heart disease Mother   . Heart disease Father   . Cancer Maternal Grandmother        ADVANCED DIRECTIVES:    HEALTH MAINTENANCE: Social History  Substance Use Topics  . Smoking status: Former Smoker -- 1.00 packs/day for 46 years    Types: Cigarettes    Quit date: 11/26/2005  . Smokeless tobacco: Never Used  . Alcohol Use: 5.4 oz/week    9 Shots of liquor per week     Comment: 05/23/2014 "3, 1 shot drinks maybe 3 times/wk"     Colonoscopy:  PAP:  Bone density:  Lipid panel:  No Known Allergies  Current Outpatient Prescriptions  Medication Sig Dispense Refill  . acetaminophen (TYLENOL) 160 MG/5ML liquid Take 500 mg by mouth every 4 (four) hours as needed for fever.    . ARTIFICIAL TEAR OINTMENT OP Apply 1 application to eye as needed. For dry eyes    . aspirin 325 MG tablet Take 325 mg by mouth daily.    Marland Kitchen atorvastatin (LIPITOR) 20 MG tablet Take  20 mg by mouth daily.    Marland Kitchen azelastine (ASTELIN) 137 MCG/SPRAY nasal spray Place 1 spray into the nose 2 (two) times daily. Use in each nostril as directed    . famotidine (PEPCID) 20 MG tablet Take 20 mg by mouth 2 (two) times daily.    . fluticasone (FLONASE) 50 MCG/ACT nasal spray Place 2 sprays into the nose daily.    . folic acid (FOLVITE) 1 MG tablet Take 1 mg by mouth daily.    . magnesium oxide (MAG-OX) 400 MG tablet Take 800 mg by mouth 3 (three) times daily.    Marland Kitchen morphine 10 MG/5ML solution Take 6-12 mg by mouth every 3 (three) hours as needed for severe pain.    Marland Kitchen omeprazole (PRILOSEC) 20 MG capsule     . oxyCODONE (OXY IR/ROXICODONE) 5 MG immediate release tablet Take 1-3 tablets for pain as needed every 3-4 hours Earliest Fill Date: 07/16/15      . oxyCODONE-acetaminophen (ROXICET) 5-325 MG per tablet Take 1-2 tablets by mouth every 4 (four) hours as needed (Pain). 60 tablet 0  . potassium chloride (K-DUR) 10 MEQ tablet     . predniSONE (DELTASONE) 5 MG tablet Take 5 mg by mouth daily with breakfast.    . prochlorperazine (COMPAZINE) 10 MG tablet Take 10 mg by mouth every 6 (six) hours as needed for nausea or vomiting.    . tadalafil (CIALIS) 5 MG tablet Take 5 mg by mouth daily.    . traMADol (ULTRAM) 50 MG tablet Take 2 tablets (100 mg total) by mouth every 6 (six) hours. 100 tablet 0  . white petrolatum (VASELINE) GEL Apply 1 application topically as needed for lip care or dry skin.    Marland Kitchen white petrolatum ointment Place 1 application into both eyes as needed (ever hour as needed for dry eyes).     No current facility-administered medications for this visit.    OBJECTIVE: Filed Vitals:   01/16/16 0850  BP: 146/66  Pulse: 67  Temp: 97.3 F (36.3 C)  Resp: 18     Body mass index is 23.06 kg/(m^2).    ECOG FS:0 - Asymptomatic  General: Well-developed, well-nourished, no acute distress. Eyes: Pink conjunctiva, anicteric sclera. Lungs: Clear to auscultation bilaterally. Heart: Regular rate and rhythm. No rubs, murmurs, or gallops. Abdomen: Soft, nontender, nondistended. No organomegaly noted, normoactive bowel sounds. Musculoskeletal: No edema, cyanosis, or clubbing. Neuro: Alert, answering all questions appropriately. Cranial nerves grossly intact. Skin: No rashes or petechiae noted. Psych: Normal affect.   LAB RESULTS:  Lab Results  Component Value Date   NA 139 05/28/2015   K 2.9* 05/28/2015   CL 92* 05/28/2015   CO2 33* 05/28/2015   GLUCOSE 184* 05/28/2015   BUN 23* 05/28/2015   CREATININE 0.77 05/28/2015   CALCIUM 9.9 05/28/2015   PROT 6.9 05/28/2015   ALBUMIN 3.7 05/28/2015   AST 18 05/28/2015   ALT 13* 05/28/2015   ALKPHOS 72 05/28/2015   BILITOT 0.8 05/28/2015   GFRNONAA >60 05/28/2015   GFRAA >60  05/28/2015    Lab Results  Component Value Date   WBC 11.7* 05/28/2015   NEUTROABS 10.1* 05/28/2015   HGB 11.4* 05/28/2015   HCT 33.5* 05/28/2015   MCV 91.3 05/28/2015   PLT 173 05/28/2015     STUDIES: No results found.  ASSESSMENT: Orencia infusion for rheumatoid arthritis  PLAN:    1.  RA:  Continue with Orencia 1000 mg IV q 4 weeks per Dr. Francella Solian. Clair's orders.  Patient understands that if he has questions or concerns regarding his rheumatoid arthritis or Orencia he should defer them his primary rheumatologist.  All laboratory work will also be monitored by his rheumatologist.  Return to clinic in 6 months for routine evaluation.  2. 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 Treatment per [facility].   Patient expressed understanding and was in agreement with this plan. He also understands that He can call clinic at any time with any questions, concerns, or complaints.    Lloyd Huger, MD   01/16/2016 10:44 AM

## 2016-02-13 ENCOUNTER — Inpatient Hospital Stay: Payer: Medicare Other

## 2016-02-17 ENCOUNTER — Inpatient Hospital Stay: Payer: Medicare Other | Attending: Oncology

## 2016-02-17 VITALS — BP 151/77 | HR 65 | Temp 97.0°F | Resp 20

## 2016-02-17 DIAGNOSIS — Z79899 Other long term (current) drug therapy: Secondary | ICD-10-CM | POA: Insufficient documentation

## 2016-02-17 DIAGNOSIS — M069 Rheumatoid arthritis, unspecified: Secondary | ICD-10-CM | POA: Diagnosis present

## 2016-02-17 MED ORDER — SODIUM CHLORIDE 0.9 % IV SOLN
1000.0000 mg | Freq: Once | INTRAVENOUS | Status: AC
  Administered 2016-02-17: 1000 mg via INTRAVENOUS
  Filled 2016-02-17: qty 40

## 2016-02-17 MED ORDER — SODIUM CHLORIDE 0.9 % IV SOLN
Freq: Once | INTRAVENOUS | Status: AC
  Administered 2016-02-17: 14:00:00 via INTRAVENOUS
  Filled 2016-02-17: qty 1000

## 2016-03-12 ENCOUNTER — Inpatient Hospital Stay: Payer: Medicare Other

## 2016-03-16 ENCOUNTER — Inpatient Hospital Stay: Payer: Medicare Other | Attending: Oncology

## 2016-03-16 VITALS — BP 131/66 | HR 83 | Temp 98.4°F | Resp 20

## 2016-03-16 DIAGNOSIS — M05711 Rheumatoid arthritis with rheumatoid factor of right shoulder without organ or systems involvement: Secondary | ICD-10-CM

## 2016-03-16 DIAGNOSIS — M069 Rheumatoid arthritis, unspecified: Secondary | ICD-10-CM | POA: Insufficient documentation

## 2016-03-16 DIAGNOSIS — Z79899 Other long term (current) drug therapy: Secondary | ICD-10-CM | POA: Diagnosis not present

## 2016-03-16 MED ORDER — ABATACEPT 250 MG IV SOLR
1000.0000 mg | Freq: Once | INTRAVENOUS | Status: AC
  Administered 2016-03-16: 1000 mg via INTRAVENOUS
  Filled 2016-03-16: qty 40

## 2016-03-16 MED ORDER — SODIUM CHLORIDE 0.9 % IV SOLN
Freq: Once | INTRAVENOUS | Status: AC
  Administered 2016-03-16: 14:00:00 via INTRAVENOUS
  Filled 2016-03-16: qty 1000

## 2016-04-09 ENCOUNTER — Inpatient Hospital Stay: Payer: Medicare Other

## 2016-04-13 ENCOUNTER — Inpatient Hospital Stay: Payer: Medicare Other | Attending: Oncology

## 2016-04-13 VITALS — BP 173/76 | HR 75 | Temp 97.4°F | Resp 20

## 2016-04-13 DIAGNOSIS — M069 Rheumatoid arthritis, unspecified: Secondary | ICD-10-CM | POA: Insufficient documentation

## 2016-04-13 DIAGNOSIS — Z79899 Other long term (current) drug therapy: Secondary | ICD-10-CM | POA: Insufficient documentation

## 2016-04-13 MED ORDER — SODIUM CHLORIDE 0.9 % IV SOLN
1000.0000 mg | Freq: Once | INTRAVENOUS | Status: AC
  Administered 2016-04-13: 1000 mg via INTRAVENOUS
  Filled 2016-04-13: qty 40

## 2016-04-13 MED ORDER — SODIUM CHLORIDE 0.9 % IV SOLN
Freq: Once | INTRAVENOUS | Status: AC
  Administered 2016-04-13: 10:00:00 via INTRAVENOUS
  Filled 2016-04-13: qty 1000

## 2016-05-03 ENCOUNTER — Other Ambulatory Visit: Payer: Self-pay | Admitting: Physician Assistant

## 2016-05-03 NOTE — H&P (Signed)
[name] comes in for follow up, right shoulder.  He was seen in the past for this issue with a combination of significant cervical problems, as well as shoulder pain back in 2014.  At that time this was a marked flare up of radiculopathy with underlying changes at C5-7.  His shoulder symptoms predated that, but the marked exacerbation at that time was radicular.  That was treated with epidural cervical injection by Dr. Ron Agee.  He responded very well to that with resolution of a lot of his symptoms, but still the lingering shoulder issues which were tolerable.  He was seen subsequently for a flare up of symptoms in 2016, primarily lumbar, again treated with epidural injection, which was helpful.  With a flare up of his shoulder symptoms he was reevaluated in January by Dr. Ron Agee.  Unfortunately at this time treatment of his neck with injection really was not as helpful and he feels like his symptoms are different.  He also states he had a shoulder injection that he states really did not help at all.  He really cannot remember whether it was helpful or not with Marcaine in place.  MRI scan of his shoulder in 2013 showed some intersubstance partial tearing of his rotator cuff.  Nothing full thickness.  Repeat MRI completed on March 03, 2016 revealed further tendinopathy.  Partial tearing, as well as displacement tearing and tendinosis long head biceps tendon.  He apparently has not progressed to a full thickness tear, but his symptoms are dramatic.  He has unfavorable acromial anatomy, as well as arthritis AC joint.  He comes in to discuss treatment.   He has an underlying diagnosis of rheumatoid arthritis, currently on Methotrexate and one other medication he couldn't remember.  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  He never had any surgery.   Remaining history is reviewed.     EXAMINATION: General exam is reviewed.  Lungs clear to auscultation bilaterally.  Heart sounds normal. Specifically, 70 year-old male who does have some global changes from his rheumatoid arthritis in both hands.  His gait and stance isn't bad.  He has decreased cervical and lumbar motion, but I am not seeing a big radicular pattern today.  In regards to his right shoulder, positive impingement.  Positive palms down abduction.  He is lacking about 10% of motion.  His cuff and biceps are very irritated.  No instability.  This is not acutely inflamed.  Despite the chronic changes from his RA, there is no other marked acutely inflamed joints at this time.     X-RAYS: A dedicated three view x-ray shows a Type II-III acromion.  Marked degenerative changes AC joint.  The glenohumeral joint and subacromial space are still preserved.   DISPOSITION:  At this point in time I really do think this is probably coming from his shoulder more than his neck.  We have discussed definitive treatment with exam under anesthesia, arthroscopy.  Debridement, possible cuff repair.  Acromioplasty and distal clavicle excision.  Resection intraarticular portion of biceps.  I don't think there is an indication for tenodesis.  We went through what is involved.  We filled out paperwork.  Just to be sure I want to go ahead and re-inject him today.  This will be done both subacromial and intraarticular.  If he has absolutely no improvement with Marcaine I am  going to be hesitant to proceed with shoulder surgery.  He is to report that to [place].  Otherwise I will see him at the time of operative intervention, unless the Marcaine doesn't help at all.    PROCEDURE NOTE: The patient's clinical condition is marked by substantial pain and/or significant functional disability.  Other conservative therapy has not provided relief, is contraindicated, or not appropriate.  There is a reasonable likelihood that injection will significantly improve the  patient's pain and/or functional disability. After appropriate consent and under sterile technique right shoulder injected subacromial, as well as intraarticularly, with 80 mg of Depo-Medrol and Marcaine.  We had him sit for awhile and I think there is improvement with Marcaine in place.    In regards to his other issues, we are going to need to get clearance from his oncologist and rheumatologist.

## 2016-05-06 ENCOUNTER — Encounter (HOSPITAL_BASED_OUTPATIENT_CLINIC_OR_DEPARTMENT_OTHER): Payer: Self-pay | Admitting: *Deleted

## 2016-05-07 ENCOUNTER — Encounter (HOSPITAL_BASED_OUTPATIENT_CLINIC_OR_DEPARTMENT_OTHER)
Admission: RE | Admit: 2016-05-07 | Discharge: 2016-05-07 | Disposition: A | Discharge status: Home / Self Care | Payer: Medicare Other | Source: Ambulatory Visit | Attending: Orthopedic Surgery | Admitting: Orthopedic Surgery

## 2016-05-07 ENCOUNTER — Inpatient Hospital Stay: Payer: Medicare Other

## 2016-05-07 DIAGNOSIS — S46211A Strain of muscle, fascia and tendon of other parts of biceps, right arm, initial encounter: Secondary | ICD-10-CM | POA: Diagnosis not present

## 2016-05-07 DIAGNOSIS — J449 Chronic obstructive pulmonary disease, unspecified: Secondary | ICD-10-CM | POA: Diagnosis not present

## 2016-05-07 DIAGNOSIS — E119 Type 2 diabetes mellitus without complications: Secondary | ICD-10-CM | POA: Diagnosis not present

## 2016-05-07 DIAGNOSIS — M75101 Unspecified rotator cuff tear or rupture of right shoulder, not specified as traumatic: Secondary | ICD-10-CM | POA: Diagnosis not present

## 2016-05-07 DIAGNOSIS — M1990 Unspecified osteoarthritis, unspecified site: Secondary | ICD-10-CM | POA: Diagnosis not present

## 2016-05-07 DIAGNOSIS — M7541 Impingement syndrome of right shoulder: Secondary | ICD-10-CM | POA: Diagnosis not present

## 2016-05-07 DIAGNOSIS — M069 Rheumatoid arthritis, unspecified: Secondary | ICD-10-CM | POA: Diagnosis not present

## 2016-05-07 DIAGNOSIS — I739 Peripheral vascular disease, unspecified: Secondary | ICD-10-CM | POA: Diagnosis not present

## 2016-05-07 DIAGNOSIS — I10 Essential (primary) hypertension: Secondary | ICD-10-CM | POA: Diagnosis not present

## 2016-05-07 DIAGNOSIS — K219 Gastro-esophageal reflux disease without esophagitis: Secondary | ICD-10-CM | POA: Diagnosis not present

## 2016-05-07 DIAGNOSIS — Z87891 Personal history of nicotine dependence: Secondary | ICD-10-CM | POA: Diagnosis not present

## 2016-05-07 DIAGNOSIS — E785 Hyperlipidemia, unspecified: Secondary | ICD-10-CM | POA: Diagnosis not present

## 2016-05-07 DIAGNOSIS — M19011 Primary osteoarthritis, right shoulder: Secondary | ICD-10-CM | POA: Diagnosis not present

## 2016-05-07 LAB — BASIC METABOLIC PANEL
ANION GAP: 12 (ref 5–15)
BUN: 12 mg/dL (ref 6–20)
CALCIUM: 9.3 mg/dL (ref 8.9–10.3)
CO2: 24 mmol/L (ref 22–32)
Chloride: 96 mmol/L — ABNORMAL LOW (ref 101–111)
Creatinine, Ser: 0.98 mg/dL (ref 0.61–1.24)
GFR calc Af Amer: >60 mL/min (ref >60)
GLUCOSE: 131 mg/dL — AB (ref 65–99)
POTASSIUM: 3.8 mmol/L (ref 3.5–5.1)
SODIUM: 132 mmol/L — AB (ref 135–145)

## 2016-05-11 ENCOUNTER — Ambulatory Visit: Payer: Medicare Other

## 2016-05-12 NOTE — Anesthesia Preprocedure Evaluation (Addendum)
Anesthesia Evaluation  Patient identified by MRN, date of birth, ID band Patient awake    Reviewed: Allergy & Precautions, NPO status , Patient's Chart, lab work & pertinent test results  History of Anesthesia Complications (+) PONV and history of anesthetic complications  Airway Mallampati: I  TM Distance: >3 FB Neck ROM: Full    Dental  (+) Poor Dentition, Missing,    Pulmonary COPD, former smoker,    breath sounds clear to auscultation       Cardiovascular hypertension, + Peripheral Vascular Disease   Rhythm:Regular Rate:Normal     Neuro/Psych  Neuromuscular disease negative psych ROS   GI/Hepatic Neg liver ROS, hiatal hernia, PUD, GERD  Medicated,  Endo/Other  diabetes  Renal/GU negative Renal ROS  negative genitourinary   Musculoskeletal  (+) Arthritis ,   Abdominal   Peds negative pediatric ROS (+)  Hematology   Anesthesia Other Findings - HLD  Reproductive/Obstetrics negative OB ROS                           Lab Results  Component Value Date   WBC 11.7* 05/28/2015   HGB 11.4* 05/28/2015   HCT 33.5* 05/28/2015   MCV 91.3 05/28/2015   PLT 173 05/28/2015     Anesthesia Physical Anesthesia Plan  ASA: II  Anesthesia Plan: General   Post-op Pain Management: GA combined w/ Regional for post-op pain   Induction: Intravenous  Airway Management Planned: Oral ETT and Video Laryngoscope Planned  Additional Equipment:   Intra-op Plan:   Post-operative Plan: Extubation in OR  Informed Consent: I have reviewed the patients History and Physical, chart, labs and discussed the procedure including the risks, benefits and alternatives for the proposed anesthesia with the patient or authorized representative who has indicated his/her understanding and acceptance.   Dental advisory given  Plan Discussed with: CRNA  Anesthesia Plan Comments:        Anesthesia Quick  Evaluation

## 2016-05-13 ENCOUNTER — Ambulatory Visit (HOSPITAL_BASED_OUTPATIENT_CLINIC_OR_DEPARTMENT_OTHER): Payer: Medicare Other | Admitting: Certified Registered"

## 2016-05-13 ENCOUNTER — Encounter (HOSPITAL_BASED_OUTPATIENT_CLINIC_OR_DEPARTMENT_OTHER): Payer: Self-pay

## 2016-05-13 ENCOUNTER — Ambulatory Visit (HOSPITAL_BASED_OUTPATIENT_CLINIC_OR_DEPARTMENT_OTHER)
Admission: RE | Admit: 2016-05-13 | Discharge: 2016-05-13 | Disposition: A | Discharge status: Home / Self Care | Payer: Medicare Other | Source: Ambulatory Visit | Attending: Orthopedic Surgery | Admitting: Orthopedic Surgery

## 2016-05-13 ENCOUNTER — Encounter (HOSPITAL_BASED_OUTPATIENT_CLINIC_OR_DEPARTMENT_OTHER)
Admission: RE | Disposition: A | Discharge status: Home / Self Care | Payer: Self-pay | Source: Ambulatory Visit | Attending: Orthopedic Surgery

## 2016-05-13 DIAGNOSIS — K219 Gastro-esophageal reflux disease without esophagitis: Secondary | ICD-10-CM | POA: Insufficient documentation

## 2016-05-13 DIAGNOSIS — M19011 Primary osteoarthritis, right shoulder: Secondary | ICD-10-CM | POA: Insufficient documentation

## 2016-05-13 DIAGNOSIS — M069 Rheumatoid arthritis, unspecified: Secondary | ICD-10-CM | POA: Insufficient documentation

## 2016-05-13 DIAGNOSIS — M75101 Unspecified rotator cuff tear or rupture of right shoulder, not specified as traumatic: Secondary | ICD-10-CM | POA: Insufficient documentation

## 2016-05-13 DIAGNOSIS — I10 Essential (primary) hypertension: Secondary | ICD-10-CM | POA: Insufficient documentation

## 2016-05-13 DIAGNOSIS — S46211A Strain of muscle, fascia and tendon of other parts of biceps, right arm, initial encounter: Secondary | ICD-10-CM | POA: Insufficient documentation

## 2016-05-13 DIAGNOSIS — E785 Hyperlipidemia, unspecified: Secondary | ICD-10-CM | POA: Insufficient documentation

## 2016-05-13 DIAGNOSIS — Z87891 Personal history of nicotine dependence: Secondary | ICD-10-CM | POA: Insufficient documentation

## 2016-05-13 DIAGNOSIS — E119 Type 2 diabetes mellitus without complications: Secondary | ICD-10-CM | POA: Insufficient documentation

## 2016-05-13 DIAGNOSIS — M1990 Unspecified osteoarthritis, unspecified site: Secondary | ICD-10-CM | POA: Insufficient documentation

## 2016-05-13 DIAGNOSIS — J449 Chronic obstructive pulmonary disease, unspecified: Secondary | ICD-10-CM | POA: Insufficient documentation

## 2016-05-13 DIAGNOSIS — M7541 Impingement syndrome of right shoulder: Secondary | ICD-10-CM | POA: Diagnosis not present

## 2016-05-13 DIAGNOSIS — I739 Peripheral vascular disease, unspecified: Secondary | ICD-10-CM | POA: Insufficient documentation

## 2016-05-13 HISTORY — PX: SHOULDER ARTHROSCOPY WITH ROTATOR CUFF REPAIR AND SUBACROMIAL DECOMPRESSION: SHX5686

## 2016-05-13 HISTORY — PX: RESECTION DISTAL CLAVICAL: SHX5053

## 2016-05-13 SURGERY — SHOULDER ARTHROSCOPY WITH ROTATOR CUFF REPAIR AND SUBACROMIAL DECOMPRESSION
Anesthesia: General | Site: Shoulder | Laterality: Right

## 2016-05-13 MED ORDER — CEFAZOLIN SODIUM-DEXTROSE 2-4 GM/100ML-% IV SOLN
INTRAVENOUS | Status: AC
  Filled 2016-05-13: qty 100

## 2016-05-13 MED ORDER — MEPERIDINE HCL 25 MG/ML IJ SOLN: 6.2500 mg | INTRAMUSCULAR | Status: DC | PRN

## 2016-05-13 MED ORDER — SODIUM CHLORIDE 0.9 % IR SOLN
Status: DC | PRN
  Administered 2016-05-13: 9000 mL

## 2016-05-13 MED ORDER — DEXAMETHASONE SODIUM PHOSPHATE 4 MG/ML IJ SOLN
INTRAMUSCULAR | Status: DC | PRN
  Administered 2016-05-13: 10 mg via INTRAVENOUS

## 2016-05-13 MED ORDER — OXYCODONE HCL 5 MG PO TABS: 5.0000 mg | ORAL_TABLET | Freq: Once | ORAL | Status: DC | PRN

## 2016-05-13 MED ORDER — FENTANYL CITRATE (PF) 100 MCG/2ML IJ SOLN
INTRAMUSCULAR | Status: AC
  Filled 2016-05-13: qty 2

## 2016-05-13 MED ORDER — SUCCINYLCHOLINE CHLORIDE 200 MG/10ML IV SOSY
PREFILLED_SYRINGE | INTRAVENOUS | Status: AC
  Filled 2016-05-13: qty 10

## 2016-05-13 MED ORDER — CHLORHEXIDINE GLUCONATE 4 % EX LIQD: 60.0000 mL | Freq: Once | CUTANEOUS | Status: DC

## 2016-05-13 MED ORDER — LIDOCAINE 2% (20 MG/ML) 5 ML SYRINGE
INTRAMUSCULAR | Status: AC
  Filled 2016-05-13: qty 5

## 2016-05-13 MED ORDER — ONDANSETRON HCL 4 MG/2ML IJ SOLN
INTRAMUSCULAR | Status: AC
  Filled 2016-05-13: qty 4

## 2016-05-13 MED ORDER — MIDAZOLAM HCL 2 MG/2ML IJ SOLN
1.0000 mg | INTRAMUSCULAR | Status: DC | PRN
  Administered 2016-05-13: 2 mg via INTRAVENOUS

## 2016-05-13 MED ORDER — PROPOFOL 10 MG/ML IV BOLUS
INTRAVENOUS | Status: DC | PRN
  Administered 2016-05-13: 150 mg via INTRAVENOUS

## 2016-05-13 MED ORDER — DEXTROSE 5 % IV SOLN
10.0000 mg | INTRAVENOUS | Status: DC | PRN
  Administered 2016-05-13: 10 ug/min via INTRAVENOUS

## 2016-05-13 MED ORDER — DEXAMETHASONE SODIUM PHOSPHATE 10 MG/ML IJ SOLN
INTRAMUSCULAR | Status: AC
  Filled 2016-05-13: qty 1

## 2016-05-13 MED ORDER — LACTATED RINGERS IV SOLN: INTRAVENOUS | Status: DC

## 2016-05-13 MED ORDER — PROMETHAZINE HCL 25 MG/ML IJ SOLN: 6.2500 mg | INTRAMUSCULAR | Status: DC | PRN

## 2016-05-13 MED ORDER — FENTANYL CITRATE (PF) 100 MCG/2ML IJ SOLN: 25.0000 ug | INTRAMUSCULAR | Status: DC | PRN

## 2016-05-13 MED ORDER — GLYCOPYRROLATE 0.2 MG/ML IJ SOLN: 0.2000 mg | Freq: Once | INTRAMUSCULAR | Status: DC | PRN

## 2016-05-13 MED ORDER — LIDOCAINE HCL (CARDIAC) 20 MG/ML IV SOLN
INTRAVENOUS | Status: DC | PRN
  Administered 2016-05-13: 30 mg via INTRAVENOUS

## 2016-05-13 MED ORDER — MIDAZOLAM HCL 2 MG/2ML IJ SOLN
INTRAMUSCULAR | Status: AC
  Filled 2016-05-13: qty 2

## 2016-05-13 MED ORDER — ONDANSETRON HCL 4 MG/2ML IJ SOLN
INTRAMUSCULAR | Status: DC | PRN
Start: 2016-05-13 — End: 2016-05-13
  Administered 2016-05-13: 4 mg via INTRAVENOUS

## 2016-05-13 MED ORDER — OXYCODONE-ACETAMINOPHEN 5-325 MG PO TABS: 1.0000 | ORAL_TABLET | ORAL | Status: DC | PRN

## 2016-05-13 MED ORDER — CEFAZOLIN SODIUM-DEXTROSE 2-4 GM/100ML-% IV SOLN
2.0000 g | INTRAVENOUS | Status: AC
  Administered 2016-05-13: 2 g via INTRAVENOUS

## 2016-05-13 MED ORDER — OXYCODONE HCL 5 MG/5ML PO SOLN: 5.0000 mg | Freq: Once | ORAL | Status: DC | PRN

## 2016-05-13 MED ORDER — FENTANYL CITRATE (PF) 100 MCG/2ML IJ SOLN
50.0000 ug | INTRAMUSCULAR | Status: DC | PRN
  Administered 2016-05-13: 100 ug via INTRAVENOUS
  Administered 2016-05-13: 25 ug via INTRAVENOUS

## 2016-05-13 MED ORDER — SCOPOLAMINE 1 MG/3DAYS TD PT72: 1.0000 | MEDICATED_PATCH | Freq: Once | TRANSDERMAL | Status: DC | PRN

## 2016-05-13 MED ORDER — PROPOFOL 500 MG/50ML IV EMUL
INTRAVENOUS | Status: AC
  Filled 2016-05-13: qty 50

## 2016-05-13 MED ORDER — SUCCINYLCHOLINE CHLORIDE 20 MG/ML IJ SOLN
INTRAMUSCULAR | Status: DC | PRN
  Administered 2016-05-13: 100 mg via INTRAVENOUS

## 2016-05-13 MED ORDER — LACTATED RINGERS IV SOLN
INTRAVENOUS | Status: DC
  Administered 2016-05-13 (×2): via INTRAVENOUS

## 2016-05-13 MED ORDER — BUPIVACAINE-EPINEPHRINE (PF) 0.5% -1:200000 IJ SOLN
INTRAMUSCULAR | Status: DC | PRN
Start: 2016-05-13 — End: 2016-05-13
  Administered 2016-05-13: 15 mL via PERINEURAL

## 2016-05-13 SURGICAL SUPPLY — 76 items
ANCH SUT SWLK 19.1X4.75 (Anchor) ×2 IMPLANT
ANCHOR SUT BIO SW 4.75X19.1 (Anchor) ×4 IMPLANT
APL SKNCLS STERI-STRIP NONHPOA (GAUZE/BANDAGES/DRESSINGS)
BENZOIN TINCTURE PRP APPL 2/3 (GAUZE/BANDAGES/DRESSINGS) IMPLANT
BLADE CUTTER GATOR 3.5 (BLADE) ×3 IMPLANT
BLADE CUTTER MENIS 5.5 (BLADE) IMPLANT
BLADE GREAT WHITE 4.2 (BLADE) ×2 IMPLANT
BLADE GREAT WHITE 4.2MM (BLADE) ×1
BLADE SURG 15 STRL LF DISP TIS (BLADE) ×1 IMPLANT
BLADE SURG 15 STRL SS (BLADE)
BUR OVAL 6.0 (BURR) ×3 IMPLANT
CANNULA DRY DOC 8X75 (CANNULA) ×2 IMPLANT
CANNULA TWIST IN 8.25X7CM (CANNULA) ×2 IMPLANT
CLOSURE WOUND 1/2 X4 (GAUZE/BANDAGES/DRESSINGS)
DECANTER SPIKE VIAL GLASS SM (MISCELLANEOUS) IMPLANT
DRAPE STERI 35X30 U-POUCH (DRAPES) ×3 IMPLANT
DRAPE U-SHAPE 47X51 STRL (DRAPES) ×3 IMPLANT
DRAPE U-SHAPE 76X120 STRL (DRAPES) ×6 IMPLANT
DRSG PAD ABDOMINAL 8X10 ST (GAUZE/BANDAGES/DRESSINGS) ×3 IMPLANT
DURAPREP 26ML APPLICATOR (WOUND CARE) ×3 IMPLANT
ELECT MENISCUS 165MM 90D (ELECTRODE) ×3 IMPLANT
ELECT REM PT RETURN 9FT ADLT (ELECTROSURGICAL) ×3
ELECTRODE REM PT RTRN 9FT ADLT (ELECTROSURGICAL) ×1 IMPLANT
GAUZE SPONGE 4X4 12PLY STRL (GAUZE/BANDAGES/DRESSINGS) ×6 IMPLANT
GAUZE XEROFORM 1X8 LF (GAUZE/BANDAGES/DRESSINGS) ×3 IMPLANT
GLOVE BIOGEL PI IND STRL 6.5 (GLOVE) IMPLANT
GLOVE BIOGEL PI IND STRL 7.0 (GLOVE) ×1 IMPLANT
GLOVE BIOGEL PI INDICATOR 6.5 (GLOVE) ×4
GLOVE BIOGEL PI INDICATOR 7.0 (GLOVE) ×10
GLOVE ECLIPSE 6.5 STRL STRAW (GLOVE) ×2 IMPLANT
GLOVE ECLIPSE 7.0 STRL STRAW (GLOVE) ×3 IMPLANT
GLOVE SURG ORTHO 8.0 STRL STRW (GLOVE) ×3 IMPLANT
GLOVE SURG SS PI 6.0 STRL IVOR (GLOVE) ×6 IMPLANT
GOWN STRL REUS W/ TWL LRG LVL3 (GOWN DISPOSABLE) ×2 IMPLANT
GOWN STRL REUS W/ TWL XL LVL3 (GOWN DISPOSABLE) ×1 IMPLANT
GOWN STRL REUS W/TWL LRG LVL3 (GOWN DISPOSABLE) ×12
GOWN STRL REUS W/TWL XL LVL3 (GOWN DISPOSABLE) ×3
IV NS IRRIG 3000ML ARTHROMATIC (IV SOLUTION) ×12 IMPLANT
MANIFOLD NEPTUNE II (INSTRUMENTS) ×3 IMPLANT
NDL SCORPION MULTI FIRE (NEEDLE) IMPLANT
NDL SUT 6 .5 CRC .975X.05 MAYO (NEEDLE) IMPLANT
NEEDLE MAYO TAPER (NEEDLE)
NEEDLE SCORPION MULTI FIRE (NEEDLE) ×3 IMPLANT
NS IRRIG 1000ML POUR BTL (IV SOLUTION) IMPLANT
PACK ARTHROSCOPY DSU (CUSTOM PROCEDURE TRAY) ×3 IMPLANT
PASSER SUT SWANSON 36MM LOOP (INSTRUMENTS) IMPLANT
PENCIL BUTTON HOLSTER BLD 10FT (ELECTRODE) ×3 IMPLANT
SET ARTHROSCOPY TUBING (MISCELLANEOUS) ×3
SET ARTHROSCOPY TUBING LN (MISCELLANEOUS) ×1 IMPLANT
SLEEVE SCD COMPRESS KNEE MED (MISCELLANEOUS) ×2 IMPLANT
SLING ARM FOAM STRAP LRG (SOFTGOODS) IMPLANT
SLING ARM IMMOBILIZER LRG (SOFTGOODS) ×2 IMPLANT
SLING ARM IMMOBILIZER MED (SOFTGOODS) IMPLANT
SLING ARM MED ADULT FOAM STRAP (SOFTGOODS) IMPLANT
SLING ARM XL FOAM STRAP (SOFTGOODS) IMPLANT
SPONGE LAP 4X18 X RAY DECT (DISPOSABLE) ×3 IMPLANT
STRIP CLOSURE SKIN 1/2X4 (GAUZE/BANDAGES/DRESSINGS) IMPLANT
SUCTION FRAZIER HANDLE 10FR (MISCELLANEOUS)
SUCTION TUBE FRAZIER 10FR DISP (MISCELLANEOUS) IMPLANT
SUT ETHIBOND 2 OS 4 DA (SUTURE) IMPLANT
SUT ETHILON 2 0 FS 18 (SUTURE) IMPLANT
SUT ETHILON 3 0 PS 1 (SUTURE) ×2 IMPLANT
SUT FIBERWIRE #2 38 T-5 BLUE (SUTURE)
SUT RETRIEVER MED (INSTRUMENTS) IMPLANT
SUT TIGER TAPE 7 IN WHITE (SUTURE) ×2 IMPLANT
SUT VIC AB 0 CT1 27 (SUTURE)
SUT VIC AB 0 CT1 27XBRD ANBCTR (SUTURE) IMPLANT
SUT VIC AB 2-0 SH 27 (SUTURE)
SUT VIC AB 2-0 SH 27XBRD (SUTURE) IMPLANT
SUT VIC AB 3-0 FS2 27 (SUTURE) IMPLANT
SUTURE FIBERWR #2 38 T-5 BLUE (SUTURE) IMPLANT
TAPE FIBER 2MM 7IN #2 BLUE (SUTURE) ×2 IMPLANT
TOWEL OR 17X24 6PK STRL BLUE (TOWEL DISPOSABLE) ×3 IMPLANT
TOWEL OR NON WOVEN STRL DISP B (DISPOSABLE) ×3 IMPLANT
WATER STERILE IRR 1000ML POUR (IV SOLUTION) ×3 IMPLANT
YANKAUER SUCT BULB TIP NO VENT (SUCTIONS) IMPLANT

## 2016-05-13 NOTE — Progress Notes (Signed)
Assisted Dr. Hollis with right, ultrasound guided, supraclavicular block. Side rails up, monitors on throughout procedure. See vital signs in flow sheet. Tolerated Procedure well. 

## 2016-05-13 NOTE — Interval H&P Note (Signed)
History and Physical Interval Note:  05/13/2016 7:25 AM  [name]  has presented today for surgery, with the diagnosis of OTHER ARTICULAR CARTILAGE DISORDERS RIGHT SHOULDER, IMPINGEMENT SYNDROME OF RIGHT SHOULDER STRAIN OF MUSCLE (S) AND TENDON (S) OF THE ROTATOR CUFF OF RIGHT SHOULDER INITIAL ENCOUNTER, BICIPITAL  The various methods of treatment have been discussed with the patient and family. After consideration of risks, benefits and other options for treatment, the patient has consented to  Procedure(s) with comments: RIGHT SHOULDER SCOPE DEBRIDEMENT, DISTAL CLAVICLE EXCISION, ACROMIOPLASTY, ROTATOR CUFF REPAIR AND BICEPS TENODESIS (Right) - ANESTHESIA:  GENERAL, PRE/POST OP SCALENE as a surgical intervention .  The patient's history has been reviewed, patient examined, no change in status, stable for surgery.  I have reviewed the patient's chart and labs.  Questions were answered to the patient's satisfaction.     Ninetta Lights

## 2016-05-13 NOTE — Progress Notes (Signed)
Pt constantly moving and scratching his nose, asked to hold left arm still please so I could get his BP. Pt asked for ice chips several times within about 5 minutes. Explained he had just gotten to PACU and would be glad to get him some in a few minutes. Then he asked what time is it? Told him 0900 am - he said it is break time. When I asked who was taking care of him at home? He said "I take care of  Myself!" Explained he had to have someone stay with him for 24 hours. Then he said a buddy is taking care of me.  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 Pt very demanding at times.

## 2016-05-13 NOTE — Discharge Instructions (Signed)
Shouder arthroscopy, rotator cuff repair, subacromial decompression °Care After Instructions °Refer to this sheet in the next few weeks. These discharge instructions provide you with general information on caring for yourself after you leave the hospital. Your caregiver may also give you specific instructions. Your treatment has been planned according to the most current medical practices available, but unavoidable complications sometimes occur. If you have any problems or questions after discharge, please call your caregiver. °HOME INSTRUCTIONS °You may resume a normal diet and activities as directed.  °Take showers instead of baths until informed otherwise.  °Change bandages (dressings) in 3 days.  Swab wounds daily with betadine.  Wash shoulder with soap and water.  Pat dry.  Cover wounds with bandaids. °Only take over-the-counter or prescription medicines for pain, discomfort, or fever as directed by your caregiver.  °Wear your sling for the next 6 weeks unless otherwise instructed. °Eat a well-balanced diet.  °Avoid lifting or driving until you are instructed otherwise.  °Make an appointment to see your caregiver for stitches (suture) or staple removal one week after surgery.  ° °SEEK MEDICAL CARE IF: °You have swelling of your calf or leg.  °You develop shortness of breath or chest pain.  °You have redness, swelling, or increasing pain in the wound.  °There is pus or any unusual drainage coming from the surgical site.  °You notice a bad smell coming from the surgical site or dressing.  °The surgical site breaks open after sutures or staples have been removed.  °There is persistent bleeding from the suture or staple line.  °You are getting worse or are not improving.  °You have any other questions or concerns.  °SEEK IMMEDIATE MEDICAL CARE IF:  °You have a fever greater than 101 °You develop a rash.  °You have difficulty breathing.  °You develop any reaction or side effects to medicines given.  °Your knee  motion is decreasing rather than improving.  °MAKE SURE YOU:  °Understand these instructions.  °Will watch your condition.  °Will get help right away if you are not doing well or get worse.  ° ° ° °Regional Anesthesia Blocks ° °1. Numbness or the inability to move the "blocked" extremity may last from 3-48 hours after placement. The length of time depends on the medication injected and your individual response to the medication. If the numbness is not going away after 48 hours, call your surgeon. ° °2. The extremity that is blocked will need to be protected until the numbness is gone and the  Strength has returned. Because you cannot feel it, you will need to take extra care to avoid injury. Because it may be weak, you may have difficulty moving it or using it. You may not know what position it is in without looking at it while the block is in effect. ° °3. For blocks in the legs and feet, returning to weight bearing and walking needs to be done carefully. You will need to wait until the numbness is entirely gone and the strength has returned. You should be able to move your leg and foot normally before you try and bear weight or walk. You will need someone to be with you when you first try to ensure you do not fall and possibly risk injury. ° °4. Bruising and tenderness at the needle site are common side effects and will resolve in a few days. ° °5. Persistent numbness or new problems with movement should be communicated to the surgeon or the [facility] (  [phone])/ [facility] ([phone]). ° ° ° ° ° ° °Post Anesthesia Home Care Instructions ° °Activity: °Get plenty of rest for the remainder of the day. A responsible adult should stay with you for 24 hours following the procedure.  °For the next 24 hours, DO NOT: °-Drive a car °-Operate machinery °-Drink alcoholic beverages °-Take any medication unless instructed by your physician °-Make any legal decisions or sign important  papers. ° °Meals: °Start with liquid foods such as gelatin or soup. Progress to regular foods as tolerated. Avoid greasy, spicy, heavy foods. If nausea and/or vomiting occur, drink only clear liquids until the nausea and/or vomiting subsides. Call your physician if vomiting continues. ° °Special Instructions/Symptoms: °Your throat may feel dry or sore from the anesthesia or the breathing tube placed in your throat during surgery. If this causes discomfort, gargle with warm salt water. The discomfort should disappear within 24 hours. ° °If you had a scopolamine patch placed behind your ear for the management of post- operative nausea and/or vomiting: ° °1. The medication in the patch is effective for 72 hours, after which it should be removed.  Wrap patch in a tissue and discard in the trash. Wash hands thoroughly with soap and water. °2. You may remove the patch earlier than 72 hours if you experience unpleasant side effects which may include dry mouth, dizziness or visual disturbances. °3. Avoid touching the patch. Wash your hands with soap and water after contact with the patch. °  ° °

## 2016-05-13 NOTE — Anesthesia Postprocedure Evaluation (Signed)
Anesthesia Post Note  Patient: [name]  Procedure(s) Performed: Procedure(s) (LRB): RIGHT SHOULDER SCOPE DEBRIDEMENT, ACROMIOPLASTY, ROTATOR CUFF REPAIR; RELEASE BICEPS TENDON AND DEBRIDEMENT LABRUM (Right) DISTAL CLAVICLE EXCISION (Right)  Patient location during evaluation: PACU Anesthesia Type: General and Regional Level of consciousness: awake and alert Pain management: pain level controlled Vital Signs Assessment: post-procedure vital signs reviewed and stable Respiratory status: spontaneous breathing, nonlabored ventilation and respiratory function stable Cardiovascular status: blood pressure returned to baseline and stable Postop Assessment: no signs of nausea or vomiting Anesthetic complications: no    Last Vitals:  Filed Vitals:   05/13/16 0915 05/13/16 0943  BP: 132/69 123/62  Pulse: 73 81  Temp:  36.5 C  Resp: 16 16    Last Pain:  Filed Vitals:   05/13/16 0945  PainSc: 0-No pain                 Tiajuana Amass

## 2016-05-13 NOTE — Transfer of Care (Signed)
Immediate Anesthesia Transfer of Care Note  Patient: [name]  Procedure(s) Performed: Procedure(s): RIGHT SHOULDER SCOPE DEBRIDEMENT, ACROMIOPLASTY, ROTATOR CUFF REPAIR  (Right) DISTAL CLAVICLE EXCISION (Right)  Patient Location: PACU  Anesthesia Type:GA combined with regional for post-op pain  Level of Consciousness: awake, alert , oriented and patient cooperative  Airway & Oxygen Therapy: Patient Spontanous Breathing and Patient connected to face mask oxygen  Post-op Assessment: Report given to RN and Post -op Vital signs reviewed and stable  Post vital signs: Reviewed and stable  Last Vitals:  Filed Vitals:   05/13/16 0720 05/13/16 0725  BP:    Pulse:    Temp:    Resp: 16 22    Last Pain:  Filed Vitals:   05/13/16 0731  PainSc: 3       Patients Stated Pain Goal: 1 ([id number])  Complications: No apparent anesthesia complications

## 2016-05-13 NOTE — Anesthesia Procedure Notes (Addendum)
Procedure Name: Intubation Date/Time: 05/13/2016 7:35 AM Performed by: BLOCKER, TIMOTHY D Pre-anesthesia Checklist: Patient identified, Emergency Drugs available, Suction available and Patient being monitored Patient Re-evaluated:Patient Re-evaluated prior to inductionOxygen Delivery Method: Circle System Utilized Preoxygenation: Pre-oxygenation with 100% oxygen Intubation Type: IV induction Ventilation: Mask ventilation without difficulty Laryngoscope Size: Glidescope Grade View: Grade I Tube type: Oral Tube size: 7.0 mm Number of attempts: 1 Airway Equipment and Method: Stylet and Video-laryngoscopy Placement Confirmation: ETT inserted through vocal cords under direct vision,  positive ETCO2 and breath sounds checked- equal and bilateral Secured at: 21 cm Tube secured with: Tape Dental Injury: Teeth and Oropharynx as per pre-operative assessment    Anesthesia Regional Block:  Interscalene brachial plexus block  Pre-Anesthetic Checklist: ,, timeout performed, Correct Patient, Correct Site, Correct Laterality, Correct Procedure, Correct Position, site marked, Risks and benefits discussed,  Surgical consent,  Pre-op evaluation,  At surgeon's request and post-op pain management  Laterality: Right  Prep: chloraprep       Needles:  Injection technique: Single-shot  Needle Type: Echogenic Needle     Needle Length: 9cm 9 cm Needle Gauge: 21 and 21 G    Additional Needles:  Procedures: ultrasound guided (picture in chart) Interscalene brachial plexus block Narrative:  Start time: 05/13/2016 7:10 AM End time: 05/13/2016 7:15 AM Injection made incrementally with aspirations every 5 mL.  Performed by: Personally  Anesthesiologist: Suella Broad D  Additional Notes: Pt tolerated well. No immediate complications noted.

## 2016-05-14 ENCOUNTER — Encounter (HOSPITAL_BASED_OUTPATIENT_CLINIC_OR_DEPARTMENT_OTHER): Payer: Self-pay | Admitting: Orthopedic Surgery

## 2016-05-14 NOTE — Op Note (Signed)
NAMESTANLEE, DANH NO.:  [id number]  MEDICAL RECORD NO.:  [id number]  LOCATION:                                 FACILITY:  PHYSICIAN:  Ninetta Lights, M.D. DATE OF BIRTH:  12/06/1946  DATE OF PROCEDURE:  05/13/2016 DATE OF DISCHARGE:                              OPERATIVE REPORT   PREOPERATIVE DIAGNOSIS:  Right shoulder underlying rheumatoid arthritis. Extensive chronic impingement with at least partial tear and rotator cuff, mainly supraspinatus tendon.  Tearing, partial subluxation of biceps tendon.  Degenerative joint disease, acromioclavicular joint.  POSTOPERATIVE DIAGNOSIS:  Right shoulder underlying rheumatoid arthritis.  Extensive chronic impingement with at least partial tear and rotator cuff, mainly supraspinatus tendon.  Tearing, partial subluxation of biceps tendon.  Degenerative joint disease, acromioclavicular joint with a full-thickness tear in supraspinatus tendon within the anterior cable __________ region.  Tendinopathy throughout.  Degenerative labral tear.  Only minimal degenerative change, glenohumeral joint.  PROCEDURE:  Right shoulder exam under anesthesia, arthroscopy.  Released resection intra-articular portion of biceps allowing it to recess in the bicipital groove.  Debridement and mobilization of rotator cuff brought below.  Debridement of labral tears.  Bursectomy, acromioplasty, CA ligament release.  Excision of distal clavicle.  Arthroscopic-assisted rotator cuff repair, FiberWire suture x2, SwiveLock anchors x2.  SURGEON:  Ninetta Lights, MD.  ASSISTANT:  Elmyra Ricks, PA, present throughout the entire case, necessary for timely completion of procedure.  ANESTHESIA:  General.  BLOOD LOSS:  Minimal.  SPECIMENS:  None.  CULTURES:  None.  COMPLICATION:  None.  DRESSINGS:  Soft compressive, shoulder immobilizer.  PROCEDURE IN DETAIL:  The patient was brought to the operating room and after adequate  anesthesia had been obtained, shoulder examined.  Full motion, stable shoulder.  Placed in a beach-chair position on the shoulder positioner, prepped and draped in usual sterile fashion.  Three portals, anterior, posterior, and lateral.  Arthroscope introduced, shoulder __________.  Despite the rheumatoid arthritis, there was a lot of synovitis in the shoulder.  Articular cartilage, relatively well- preserved.  Full-thickness tear supraspinatus tendon __________ anterior cable, debrided, mobilized above and below.  Remaining cuff debrided throughout.  Partial tearing of the subscap allowing the biceps tendon to sublux out of groove with tearing of the tendon as it exited the shoulder.  Intra-articular portion resected, released, allowing it to recess in the bicipital groove.  Labral tears on the top and front debrided.  Cannula redirected subacromially.  Bursa resected. Acromioplasty from type 2 to type 1 acromion releasing the CA ligament. Grade 4 changes, marked spurring AC joint.  Periarticular spurs __________ centimeter of clavicle resected.  Adequacy of decompression confirmed.  Through the lateral portal cannula placed.  The __________ with 2 horizontal mattress sutures and then firmly anchored down to the tuberosity with 2 SwiveLock anchors.  Tissue quality of the cuff was a little bit __________ nice firm watertight closure.  Bone quality was good.  After confirming adequate repair and instruments completely removed.  Portals were closed with nylon.  Sterile compressive dressing applied.  Shoulder immobilizer applied.  Anesthesia reversed.  Brought to the recovery room.  Tolerated the surgery well, no complications.     Ninetta Lights,  M.D.   ______________________________ Ninetta Lights, M.D.    DFM/MEDQ  D:  05/13/2016  T:  05/14/2016  Job:  [id number]

## 2016-06-04 ENCOUNTER — Inpatient Hospital Stay: Payer: Medicare Other

## 2016-06-08 ENCOUNTER — Inpatient Hospital Stay: Payer: Medicare Other | Attending: Oncology

## 2016-06-08 VITALS — BP 120/63 | HR 72 | Temp 97.1°F | Resp 20

## 2016-06-08 DIAGNOSIS — Z79899 Other long term (current) drug therapy: Secondary | ICD-10-CM | POA: Insufficient documentation

## 2016-06-08 DIAGNOSIS — M069 Rheumatoid arthritis, unspecified: Secondary | ICD-10-CM | POA: Diagnosis not present

## 2016-06-08 DIAGNOSIS — M05719 Rheumatoid arthritis with rheumatoid factor of unspecified shoulder without organ or systems involvement: Secondary | ICD-10-CM

## 2016-06-08 MED ORDER — SODIUM CHLORIDE 0.9 % IV SOLN
Freq: Once | INTRAVENOUS | Status: AC
  Administered 2016-06-08: 10:00:00 via INTRAVENOUS
  Filled 2016-06-08: qty 1000

## 2016-06-08 MED ORDER — SODIUM CHLORIDE 0.9 % IV SOLN
1000.0000 mg | Freq: Once | INTRAVENOUS | Status: AC
  Administered 2016-06-08: 1000 mg via INTRAVENOUS
  Filled 2016-06-08: qty 40

## 2016-07-02 ENCOUNTER — Inpatient Hospital Stay: Payer: Medicare Other

## 2016-07-02 ENCOUNTER — Ambulatory Visit: Payer: Medicare Other | Admitting: Oncology

## 2016-07-09 ENCOUNTER — Inpatient Hospital Stay: Payer: Medicare Other | Attending: Oncology

## 2016-07-09 ENCOUNTER — Inpatient Hospital Stay (HOSPITAL_BASED_OUTPATIENT_CLINIC_OR_DEPARTMENT_OTHER): Payer: Medicare Other | Admitting: Oncology

## 2016-07-09 VITALS — BP 125/66 | HR 70 | Temp 97.5°F | Resp 20

## 2016-07-09 DIAGNOSIS — M069 Rheumatoid arthritis, unspecified: Secondary | ICD-10-CM | POA: Diagnosis not present

## 2016-07-09 DIAGNOSIS — Z85819 Personal history of malignant neoplasm of unspecified site of lip, oral cavity, and pharynx: Secondary | ICD-10-CM

## 2016-07-09 DIAGNOSIS — Z7952 Long term (current) use of systemic steroids: Secondary | ICD-10-CM

## 2016-07-09 DIAGNOSIS — E785 Hyperlipidemia, unspecified: Secondary | ICD-10-CM | POA: Diagnosis not present

## 2016-07-09 DIAGNOSIS — K219 Gastro-esophageal reflux disease without esophagitis: Secondary | ICD-10-CM | POA: Insufficient documentation

## 2016-07-09 DIAGNOSIS — G8929 Other chronic pain: Secondary | ICD-10-CM | POA: Insufficient documentation

## 2016-07-09 DIAGNOSIS — Z87891 Personal history of nicotine dependence: Secondary | ICD-10-CM | POA: Insufficient documentation

## 2016-07-09 DIAGNOSIS — M549 Dorsalgia, unspecified: Secondary | ICD-10-CM | POA: Insufficient documentation

## 2016-07-09 DIAGNOSIS — I10 Essential (primary) hypertension: Secondary | ICD-10-CM | POA: Insufficient documentation

## 2016-07-09 DIAGNOSIS — Z85820 Personal history of malignant melanoma of skin: Secondary | ICD-10-CM | POA: Diagnosis not present

## 2016-07-09 DIAGNOSIS — Z7982 Long term (current) use of aspirin: Secondary | ICD-10-CM | POA: Insufficient documentation

## 2016-07-09 DIAGNOSIS — Z79899 Other long term (current) drug therapy: Secondary | ICD-10-CM

## 2016-07-09 MED ORDER — SODIUM CHLORIDE 0.9 % IV SOLN
1000.0000 mg | Freq: Once | INTRAVENOUS | Status: AC
  Administered 2016-07-09: 1000 mg via INTRAVENOUS
  Filled 2016-07-09: qty 40

## 2016-07-09 MED ORDER — SODIUM CHLORIDE 0.9 % IV SOLN
Freq: Once | INTRAVENOUS | Status: AC
  Administered 2016-07-09: 11:00:00 via INTRAVENOUS
  Filled 2016-07-09: qty 1000

## 2016-07-16 NOTE — Progress Notes (Signed)
[facility]  [phone] Fax:(336) [phone]  ID: [name] OB: [birth year]  MR#: [id number]  [id number]  Patient Care Team: Hulan Fess, MD as PCP - General (Family Medicine)  CHIEF COMPLAINT: Orencia infusion for rheumatoid arthritis  INTERVAL HISTORY: Patient presents to clinic today as routine six-month followup.  He continues to take Orencia every 4 weeks with good control of his pain from rheumatoid arthritis. He currently feels well and is asymptomatic.  He had no recent fevers or illnesses.  He has no new medications.  Patient offers no specific complaints today.   REVIEW OF SYSTEMS:   Review of Systems  Constitutional: Negative for fever and malaise/fatigue.  Respiratory: Negative.  Negative for cough and shortness of breath.   Cardiovascular: Negative.  Negative for chest pain.  Gastrointestinal: Negative.  Negative for abdominal pain.  Genitourinary: Negative.   Musculoskeletal: Negative.  Negative for back pain, joint pain and neck pain.  Neurological: Negative.  Negative for weakness.  Psychiatric/Behavioral: Negative.     As per HPI. Otherwise, a complete review of systems is negatve.  PAST MEDICAL HISTORY: Past Medical History  Diagnosis Date  . Hypertension   . Dyslipidemia   . GERD (gastroesophageal reflux disease)   . PONV (postoperative nausea and vomiting)   . H/O hiatal hernia   . Rheumatoid arthritis ([place])   . Melanoma ([place])     "back; left arm"  . Chronic back pain   . Spasm of esophagus     PAST SURGICAL HISTORY: Past Surgical History  Procedure Laterality Date  . Colon surgery  2007    removed 18" of colon  . Tibia fracture surgery Left 2006    "put a steel rod in it"  . Carpal tunnel release Bilateral   . Hand surgery Bilateral     "plastic knuckles"  . Pelvic fracture surgery  1964    "busted in 2 places"  . Nasal septum surgery    . Melanoma excision      "back; left arm"  . Laparoscopic  cholecystectomy  01/2006  . Ercp w/ metal stent placement  11/2005    Archie Endo 12/14/2005  . Esophagogastroduodenoscopy (egd) with esophageal dilation      "several times'  . Shoulder arthroscopy with rotator cuff repair and subacromial decompression Right 05/13/2016    Procedure: RIGHT SHOULDER SCOPE DEBRIDEMENT, ACROMIOPLASTY, ROTATOR CUFF REPAIR; RELEASE BICEPS TENDON AND DEBRIDEMENT LABRUM;  Surgeon: Ninetta Lights, MD;  Location: [facility];  Service: Orthopedics;  Laterality: Right;  . Resection distal clavical Right 05/13/2016    Procedure: DISTAL CLAVICLE EXCISION;  Surgeon: Ninetta Lights, MD;  Location: [facility];  Service: Orthopedics;  Laterality: Right;    FAMILY HISTORY Family History  Problem Relation Age of Onset  . Heart disease Mother   . Heart disease Father   . Cancer Maternal Grandmother        ADVANCED DIRECTIVES:    HEALTH MAINTENANCE: Social History  Substance Use Topics  . Smoking status: Former Smoker -- 1.00 packs/day for 46 years    Types: Cigarettes    Quit date: 11/26/2005  . Smokeless tobacco: Never Used  . Alcohol Use: 5.4 oz/week    9 Shots of liquor per week     Comment: 05/23/2014 "3, 1 shot drinks maybe 3 times/wk"     Colonoscopy:  PAP:  Bone density:  Lipid panel:  No Known Allergies  Current Outpatient Prescriptions  Medication Sig Dispense Refill  . abatacept (  ORENCIA) 250 MG injection Inject into the vein every 30 (thirty) days.    Marland Kitchen aspirin 325 MG tablet Take 325 mg by mouth daily.    Marland Kitchen atorvastatin (LIPITOR) 20 MG tablet Take 20 mg by mouth daily.    Marland Kitchen azelastine (ASTELIN) 137 MCG/SPRAY nasal spray Place 1 spray into the nose 2 (two) times daily. Use in each nostril as directed    . fluticasone (FLONASE) 50 MCG/ACT nasal spray Place 2 sprays into the nose daily.    . folic acid (FOLVITE) 1 MG tablet Take 1 mg by mouth daily.    . magnesium oxide (MAG-OX) 400 MG tablet Take 800 mg by mouth 3 (three)  times daily.    . methotrexate (RHEUMATREX) 2.5 MG tablet Take 2.5 mg by mouth once a week.    Marland Kitchen omeprazole (PRILOSEC) 20 MG capsule     . oxyCODONE-acetaminophen (ROXICET) 5-325 MG tablet Take 1-2 tablets by mouth every 4 (four) hours as needed. 60 tablet 0  . potassium chloride (K-DUR) 10 MEQ tablet     . predniSONE (DELTASONE) 5 MG tablet Take 5 mg by mouth daily with breakfast.    . tadalafil (CIALIS) 5 MG tablet Take 5 mg by mouth daily.     No current facility-administered medications for this visit.    OBJECTIVE: There were no vitals filed for this visit.   There is no weight on file to calculate BMI.    ECOG FS:0 - Asymptomatic  General: Well-developed, well-nourished, no acute distress. Eyes: Pink conjunctiva, anicteric sclera. Lungs: Clear to auscultation bilaterally. Heart: Regular rate and rhythm. No rubs, murmurs, or gallops. Abdomen: Soft, nontender, nondistended. No organomegaly noted, normoactive bowel sounds. Musculoskeletal: No edema, cyanosis, or clubbing. Neuro: Alert, answering all questions appropriately. Cranial nerves grossly intact. Skin: No rashes or petechiae noted. Psych: Normal affect.   LAB RESULTS:  Lab Results  Component Value Date   NA 132* 05/07/2016   K 3.8 05/07/2016   CL 96* 05/07/2016   CO2 24 05/07/2016   GLUCOSE 131* 05/07/2016   BUN 12 05/07/2016   CREATININE 0.98 05/07/2016   CALCIUM 9.3 05/07/2016   PROT 6.9 05/28/2015   ALBUMIN 3.7 05/28/2015   AST 18 05/28/2015   ALT 13* 05/28/2015   ALKPHOS 72 05/28/2015   BILITOT 0.8 05/28/2015   GFRNONAA >60 05/07/2016   GFRAA >60 05/07/2016    Lab Results  Component Value Date   WBC 11.7* 05/28/2015   NEUTROABS 10.1* 05/28/2015   HGB 11.4* 05/28/2015   HCT 33.5* 05/28/2015   MCV 91.3 05/28/2015   PLT 173 05/28/2015     STUDIES: No results found.  ASSESSMENT: Orencia infusion for rheumatoid arthritis  PLAN:    1.  RA:  Continue with Orencia 1000 mg IV q 4 weeks per Dr.  Francella Solian. Clair's orders.  Patient understands that if he has questions or concerns regarding his rheumatoid arthritis or Orencia he should defer them his primary rheumatologist.  All laboratory work will also be monitored by his rheumatologist.  Return to clinic in 6 months for routine evaluation.  2. 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 Treatment per [facility].   Patient expressed understanding and was in agreement with this plan. He also understands that He can call clinic at any time with any questions, concerns, or complaints.    Lloyd Huger, MD   07/16/2016 5:59 PM

## 2016-07-30 ENCOUNTER — Inpatient Hospital Stay: Payer: Medicare Other

## 2016-08-06 ENCOUNTER — Inpatient Hospital Stay: Payer: Medicare Other

## 2016-08-10 ENCOUNTER — Inpatient Hospital Stay: Payer: Medicare Other

## 2016-08-11 ENCOUNTER — Inpatient Hospital Stay: Payer: Medicare Other | Attending: Oncology

## 2016-08-11 VITALS — BP 145/74 | HR 59 | Temp 96.8°F | Resp 18

## 2016-08-11 DIAGNOSIS — Z5112 Encounter for antineoplastic immunotherapy: Secondary | ICD-10-CM | POA: Diagnosis present

## 2016-08-11 DIAGNOSIS — M069 Rheumatoid arthritis, unspecified: Secondary | ICD-10-CM | POA: Diagnosis not present

## 2016-08-11 MED ORDER — SODIUM CHLORIDE 0.9 % IV SOLN
Freq: Once | INTRAVENOUS | Status: DC
  Filled 2016-08-11: qty 1000

## 2016-08-11 MED ORDER — SODIUM CHLORIDE 0.9 % IV SOLN
1000.0000 mg | Freq: Once | INTRAVENOUS | Status: AC
  Administered 2016-08-11: 1000 mg via INTRAVENOUS
  Filled 2016-08-11: qty 40

## 2016-08-27 ENCOUNTER — Inpatient Hospital Stay: Payer: Medicare Other

## 2016-09-03 ENCOUNTER — Inpatient Hospital Stay: Payer: Medicare Other | Attending: Oncology

## 2016-09-03 VITALS — BP 158/86 | HR 67 | Temp 96.8°F | Resp 18

## 2016-09-03 DIAGNOSIS — M069 Rheumatoid arthritis, unspecified: Secondary | ICD-10-CM

## 2016-09-03 DIAGNOSIS — Z79899 Other long term (current) drug therapy: Secondary | ICD-10-CM | POA: Diagnosis not present

## 2016-09-03 MED ORDER — SODIUM CHLORIDE 0.9 % IV SOLN
Freq: Once | INTRAVENOUS | Status: AC
  Administered 2016-09-03: 09:00:00 via INTRAVENOUS
  Filled 2016-09-03: qty 1000

## 2016-09-03 MED ORDER — SODIUM CHLORIDE 0.9 % IV SOLN
1000.0000 mg | Freq: Once | INTRAVENOUS | Status: AC
  Administered 2016-09-03: 1000 mg via INTRAVENOUS
  Filled 2016-09-03: qty 40

## 2016-09-21 ENCOUNTER — Telehealth: Payer: Self-pay | Admitting: Pharmacist

## 2016-09-21 NOTE — Telephone Encounter (Signed)
FAX RX REQUEST for Dr. Remer Macho [phone]

## 2016-09-24 ENCOUNTER — Inpatient Hospital Stay: Payer: Medicare Other

## 2016-10-01 ENCOUNTER — Inpatient Hospital Stay: Payer: Medicare Other | Attending: Oncology

## 2016-10-01 VITALS — BP 146/75 | HR 55 | Temp 97.8°F | Resp 18

## 2016-10-01 DIAGNOSIS — Z79899 Other long term (current) drug therapy: Secondary | ICD-10-CM | POA: Insufficient documentation

## 2016-10-01 DIAGNOSIS — M069 Rheumatoid arthritis, unspecified: Secondary | ICD-10-CM | POA: Insufficient documentation

## 2016-10-01 MED ORDER — SODIUM CHLORIDE 0.9 % IV SOLN
1000.0000 mg | Freq: Once | INTRAVENOUS | Status: AC
  Administered 2016-10-01: 1000 mg via INTRAVENOUS
  Filled 2016-10-01: qty 40

## 2016-10-01 MED ORDER — SODIUM CHLORIDE 0.9 % IV SOLN
Freq: Once | INTRAVENOUS | Status: AC
  Administered 2016-10-01: 11:00:00 via INTRAVENOUS
  Filled 2016-10-01: qty 1000

## 2016-10-04 ENCOUNTER — Other Ambulatory Visit: Payer: Self-pay | Admitting: Oncology

## 2016-10-22 ENCOUNTER — Inpatient Hospital Stay: Payer: Medicare Other

## 2016-11-16 ENCOUNTER — Inpatient Hospital Stay: Payer: Medicare Other | Attending: Oncology

## 2016-11-16 VITALS — BP 130/68 | HR 62 | Temp 95.9°F | Resp 18

## 2016-11-16 DIAGNOSIS — M069 Rheumatoid arthritis, unspecified: Secondary | ICD-10-CM | POA: Diagnosis not present

## 2016-11-16 DIAGNOSIS — Z79899 Other long term (current) drug therapy: Secondary | ICD-10-CM | POA: Insufficient documentation

## 2016-11-16 MED ORDER — SODIUM CHLORIDE 0.9 % IV SOLN
Freq: Once | INTRAVENOUS | Status: AC
  Administered 2016-11-16: 09:00:00 via INTRAVENOUS
  Filled 2016-11-16: qty 1000

## 2016-11-16 MED ORDER — SODIUM CHLORIDE 0.9 % IV SOLN
1000.0000 mg | INTRAVENOUS | Status: DC
  Administered 2016-11-16: 1000 mg via INTRAVENOUS
  Filled 2016-11-16: qty 40

## 2016-11-26 ENCOUNTER — Inpatient Hospital Stay: Payer: Medicare Other

## 2016-12-14 ENCOUNTER — Inpatient Hospital Stay: Payer: Medicare Other | Attending: Oncology

## 2016-12-14 VITALS — BP 113/71 | HR 76 | Temp 96.8°F | Resp 20

## 2016-12-14 DIAGNOSIS — M069 Rheumatoid arthritis, unspecified: Secondary | ICD-10-CM | POA: Insufficient documentation

## 2016-12-14 DIAGNOSIS — Z79899 Other long term (current) drug therapy: Secondary | ICD-10-CM | POA: Insufficient documentation

## 2016-12-14 MED ORDER — ABATACEPT 250 MG IV SOLR
1000.0000 mg | INTRAVENOUS | Status: DC
  Administered 2016-12-14: 1000 mg via INTRAVENOUS
  Filled 2016-12-14: qty 40

## 2016-12-14 MED ORDER — SODIUM CHLORIDE 0.9 % IV SOLN
Freq: Once | INTRAVENOUS | Status: AC
  Administered 2016-12-14: 10:00:00 via INTRAVENOUS
  Filled 2016-12-14: qty 1000

## 2016-12-16 ENCOUNTER — Ambulatory Visit: Payer: Medicare Other

## 2016-12-17 ENCOUNTER — Inpatient Hospital Stay: Payer: Medicare Other

## 2016-12-17 ENCOUNTER — Ambulatory Visit: Payer: Medicare Other

## 2016-12-24 ENCOUNTER — Inpatient Hospital Stay: Payer: Medicare Other

## 2017-01-13 ENCOUNTER — Ambulatory Visit: Payer: Medicare Other

## 2017-01-14 ENCOUNTER — Ambulatory Visit: Payer: Medicare Other | Admitting: Oncology

## 2017-01-14 ENCOUNTER — Inpatient Hospital Stay: Payer: Medicare Other

## 2017-01-14 ENCOUNTER — Ambulatory Visit: Payer: Medicare Other

## 2017-01-20 NOTE — Progress Notes (Deleted)
[name]  [phone] Fax:(336) [phone]  ID: Dan Europe OB: 1946-04-12  MR#: [id number]  [id number]  Patient Care Team: Hulan Fess, MD as PCP - General (Family Medicine)  CHIEF COMPLAINT: Orencia infusion for rheumatoid arthritis  INTERVAL HISTORY: Patient presents to clinic today as routine six-month followup.  He continues to take Orencia every 4 weeks with good control of his pain from rheumatoid arthritis. He currently feels well and is asymptomatic.  He had no recent fevers or illnesses.  He has no new medications.  Patient offers no specific complaints today.   REVIEW OF SYSTEMS:   Review of Systems  Constitutional: Negative for fever and malaise/fatigue.  Respiratory: Negative.  Negative for cough and shortness of breath.   Cardiovascular: Negative.  Negative for chest pain.  Gastrointestinal: Negative.  Negative for abdominal pain.  Genitourinary: Negative.   Musculoskeletal: Negative.  Negative for back pain, joint pain and neck pain.  Neurological: Negative.  Negative for weakness.  Psychiatric/Behavioral: Negative.     As per HPI. Otherwise, a complete review of systems is negatve.  PAST MEDICAL HISTORY: Past Medical History:  Diagnosis Date  . Chronic back pain   . Dyslipidemia   . GERD (gastroesophageal reflux disease)   . H/O hiatal hernia   . Hypertension   . Melanoma ([place])    "back; left arm"  . PONV (postoperative nausea and vomiting)   . Rheumatoid arthritis ([place])   . Spasm of esophagus     PAST SURGICAL HISTORY: Past Surgical History:  Procedure Laterality Date  . CARPAL TUNNEL RELEASE Bilateral   . COLON SURGERY  2007   removed 18" of colon  . ERCP W/ METAL STENT PLACEMENT  11/2005   Archie Endo 12/14/2005  . ESOPHAGOGASTRODUODENOSCOPY (EGD) WITH ESOPHAGEAL DILATION     "several times'  . HAND SURGERY Bilateral    "plastic knuckles"  . LAPAROSCOPIC CHOLECYSTECTOMY  01/2006  . MELANOMA EXCISION     "back; left arm"  . NASAL SEPTUM SURGERY    . PELVIC FRACTURE SURGERY  1964   "busted in 2 places"  . RESECTION DISTAL CLAVICAL Right 05/13/2016   Procedure: DISTAL CLAVICLE EXCISION;  Surgeon: Ninetta Lights, MD;  Location: [place];  Service: Orthopedics;  Laterality: Right;  . SHOULDER ARTHROSCOPY WITH ROTATOR CUFF REPAIR AND SUBACROMIAL DECOMPRESSION Right 05/13/2016   Procedure: RIGHT SHOULDER SCOPE DEBRIDEMENT, ACROMIOPLASTY, ROTATOR CUFF REPAIR; RELEASE BICEPS TENDON AND DEBRIDEMENT LABRUM;  Surgeon: Ninetta Lights, MD;  Location: [place];  Service: Orthopedics;  Laterality: Right;  . TIBIA FRACTURE SURGERY Left 2006   "put a steel rod in it"    FAMILY HISTORY Family History  Problem Relation Age of Onset  . Heart disease Mother   . Heart disease Father   . Cancer Maternal Grandmother        ADVANCED DIRECTIVES:    HEALTH MAINTENANCE: Social History  Substance Use Topics  . Smoking status: Former Smoker    Packs/day: 1.00    Years: 46.00    Types: Cigarettes    Quit date: 11/26/2005  . Smokeless tobacco: Never Used  . Alcohol use 5.4 oz/week    9 Shots of liquor per week     Comment: 05/23/2014 "3, 1 shot drinks maybe 3 times/wk"     Colonoscopy:  PAP:  Bone density:  Lipid panel:  No Known Allergies  Current Outpatient Prescriptions  Medication Sig Dispense Refill  . abatacept (ORENCIA) 250 MG injection Inject into the  vein every 30 (thirty) days.    Marland Kitchen aspirin 325 MG tablet Take 325 mg by mouth daily.    Marland Kitchen atorvastatin (LIPITOR) 20 MG tablet Take 20 mg by mouth daily.    Marland Kitchen azelastine (ASTELIN) 137 MCG/SPRAY nasal spray Place 1 spray into the nose 2 (two) times daily. Use in each nostril as directed    . fluticasone (FLONASE) 50 MCG/ACT nasal spray Place 2 sprays into the nose daily.    . folic acid (FOLVITE) 1 MG tablet Take 1 mg by mouth daily.    . magnesium oxide (MAG-OX) 400 MG tablet Take 800 mg by mouth 3 (three)  times daily.    . methotrexate (RHEUMATREX) 2.5 MG tablet Take 2.5 mg by mouth once a week.    Marland Kitchen omeprazole (PRILOSEC) 20 MG capsule     . oxyCODONE-acetaminophen (ROXICET) 5-325 MG tablet Take 1-2 tablets by mouth every 4 (four) hours as needed. 60 tablet 0  . potassium chloride (K-DUR) 10 MEQ tablet     . predniSONE (DELTASONE) 5 MG tablet Take 5 mg by mouth daily with breakfast.    . tadalafil (CIALIS) 5 MG tablet Take 5 mg by mouth daily.     No current facility-administered medications for this visit.     OBJECTIVE: There were no vitals filed for this visit.   There is no height or weight on file to calculate BMI.    ECOG FS:0 - Asymptomatic  General: Well-developed, well-nourished, no acute distress. Eyes: Pink conjunctiva, anicteric sclera. Lungs: Clear to auscultation bilaterally. Heart: Regular rate and rhythm. No rubs, murmurs, or gallops. Abdomen: Soft, nontender, nondistended. No organomegaly noted, normoactive bowel sounds. Musculoskeletal: No edema, cyanosis, or clubbing. Neuro: Alert, answering all questions appropriately. Cranial nerves grossly intact. Skin: No rashes or petechiae noted. Psych: Normal affect.   LAB RESULTS:  Lab Results  Component Value Date   NA 132 (L) 05/07/2016   K 3.8 05/07/2016   CL 96 (L) 05/07/2016   CO2 24 05/07/2016   GLUCOSE 131 (H) 05/07/2016   BUN 12 05/07/2016   CREATININE 0.98 05/07/2016   CALCIUM 9.3 05/07/2016   PROT 6.9 05/28/2015   ALBUMIN 3.7 05/28/2015   AST 18 05/28/2015   ALT 13 (L) 05/28/2015   ALKPHOS 72 05/28/2015   BILITOT 0.8 05/28/2015   GFRNONAA >60 05/07/2016   GFRAA >60 05/07/2016    Lab Results  Component Value Date   WBC 11.7 (H) 05/28/2015   NEUTROABS 10.1 (H) 05/28/2015   HGB 11.4 (L) 05/28/2015   HCT 33.5 (L) 05/28/2015   MCV 91.3 05/28/2015   PLT 173 05/28/2015     STUDIES: No results found.  ASSESSMENT: Orencia infusion for rheumatoid arthritis  PLAN:    1.  Orencia infusion for  rheumatoid arthritis:  Continue with Orencia 1000 mg IV q 4 weeks per Dr. Francella Solian. Clair's orders.  Patient understands that if he has questions or concerns regarding his rheumatoid arthritis or Orencia he should defer them his primary rheumatologist.  All laboratory work will also be monitored by his rheumatologist.  Return to clinic in 6 months for routine evaluation.  2. 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 Treatment per [facility].   Patient expressed understanding and was in agreement with this plan. He also understands that He can call clinic at any time with any questions, concerns, or complaints.    Lloyd Huger, MD   01/20/2017 10:52 PM

## 2017-01-21 ENCOUNTER — Telehealth: Payer: Self-pay | Admitting: Pharmacist

## 2017-01-21 ENCOUNTER — Inpatient Hospital Stay: Payer: Medicare Other

## 2017-01-21 ENCOUNTER — Ambulatory Visit: Payer: Medicare Other | Admitting: Oncology

## 2017-01-21 MED ORDER — SODIUM CHLORIDE 0.9 % IV SOLN
Freq: Once | INTRAVENOUS | Status: DC
  Filled 2017-01-21: qty 1000

## 2017-01-21 MED ORDER — SODIUM CHLORIDE 0.9 % IV SOLN
1000.0000 mg | INTRAVENOUS | Status: DC
  Filled 2017-01-21 (×3): qty 40

## 2017-01-21 NOTE — Telephone Encounter (Signed)
Called patient to see if he was going to make appointment today. He said he was not. He has just had surgery. He said he cancelled appointment on line. He will call when he is better an reschedule appointment.

## 2017-02-11 ENCOUNTER — Ambulatory Visit: Payer: Medicare Other

## 2017-02-15 ENCOUNTER — Encounter: Payer: Self-pay | Admitting: *Deleted

## 2017-02-15 ENCOUNTER — Inpatient Hospital Stay: Payer: Medicare Other

## 2017-02-15 NOTE — Progress Notes (Signed)
Patient walked into [facility] stating that he had an apt for orencia infusion. Pt states that a scheduler called and informed him that his apt was today.  Pt informed that his apt was not until 02/22/17. Pt informed that pharmacy is not available to mix his drug today and also he is here a few days early for his infusion. He gave verbal understanding and agreed to come back in 1 weeks time.

## 2017-02-18 ENCOUNTER — Inpatient Hospital Stay: Payer: Medicare Other

## 2017-02-21 ENCOUNTER — Ambulatory Visit: Payer: Medicare Other

## 2017-02-22 ENCOUNTER — Inpatient Hospital Stay: Payer: Medicare Other | Attending: Oncology

## 2017-02-22 VITALS — BP 130/67 | HR 76 | Temp 95.5°F | Resp 20

## 2017-02-22 DIAGNOSIS — Z79899 Other long term (current) drug therapy: Secondary | ICD-10-CM | POA: Diagnosis not present

## 2017-02-22 DIAGNOSIS — M069 Rheumatoid arthritis, unspecified: Secondary | ICD-10-CM | POA: Diagnosis present

## 2017-02-22 MED ORDER — SODIUM CHLORIDE 0.9 % IV SOLN
1000.0000 mg | INTRAVENOUS | Status: DC
  Administered 2017-02-22: 1000 mg via INTRAVENOUS
  Filled 2017-02-22: qty 40

## 2017-02-22 MED ORDER — SODIUM CHLORIDE 0.9 % IV SOLN
Freq: Once | INTRAVENOUS | Status: AC
  Administered 2017-02-22: 14:00:00 via INTRAVENOUS
  Filled 2017-02-22: qty 1000

## 2017-03-11 ENCOUNTER — Ambulatory Visit: Payer: Medicare Other

## 2017-03-15 ENCOUNTER — Inpatient Hospital Stay: Payer: Medicare Other

## 2017-03-18 ENCOUNTER — Inpatient Hospital Stay: Payer: Medicare Other | Attending: Oncology

## 2017-03-18 VITALS — BP 143/71 | HR 66 | Resp 20

## 2017-03-18 DIAGNOSIS — Z79899 Other long term (current) drug therapy: Secondary | ICD-10-CM | POA: Insufficient documentation

## 2017-03-18 DIAGNOSIS — M069 Rheumatoid arthritis, unspecified: Secondary | ICD-10-CM | POA: Diagnosis not present

## 2017-03-18 MED ORDER — SODIUM CHLORIDE 0.9 % IV SOLN
1000.0000 mg | INTRAVENOUS | Status: DC
  Administered 2017-03-18: 1000 mg via INTRAVENOUS
  Filled 2017-03-18: qty 40

## 2017-03-18 MED ORDER — SODIUM CHLORIDE 0.9 % IV SOLN
Freq: Once | INTRAVENOUS | Status: AC
  Administered 2017-03-18: 10:00:00 via INTRAVENOUS
  Filled 2017-03-18: qty 1000

## 2017-03-18 NOTE — Patient Instructions (Signed)
Abatacept solution for injection (subcutaneous or intravenous use)  What is this medicine?  ABATACEPT (a ba TA sept) is used to treat moderate to severe active rheumatoid arthritis or psoriatic arthritis in adults. This medicine is also used to treat juvenile idiopathic arthritis.  This medicine may be used for other purposes; ask your health care provider or pharmacist if you have questions.  COMMON BRAND NAME(S): Orencia  What should I tell my health care provider before I take this medicine?  They need to know if you have any of these conditions:  -are taking other medicines to treat rheumatoid arthritis  -COPD  -diabetes  -infection or history of infections  -recently received or scheduled to receive a vaccine  -scheduled to have surgery  -tuberculosis, a positive skin test for tuberculosis or have recently been in close contact with someone who has tuberculosis  -viral hepatitis  -an unusual or allergic reaction to abatacept, other medicines, foods, dyes, or preservatives  -pregnant or trying to get pregnant  -breast-feeding  How should I use this medicine?  This medicine is for infusion into a vein or for injection under the skin. Infusions are given by a health care professional in a hospital or clinic setting. If you are to give your own medicine at home, you will be taught how to prepare and give this medicine under the skin. Use exactly as directed. Take your medicine at regular intervals. Do not take your medicine more often than directed.  It is important that you put your used needles and syringes in a special sharps container. Do not put them in a trash can. If you do not have a sharps container, call your pharmacist or healthcare provider to get one.  Talk to your pediatrician regarding the use of this medicine in children. While infusions in a clinic may be prescribed for children as young as 2 years for selected conditions, precautions do apply.  Overdosage: If you think you have taken too much of  this medicine contact a poison control center or emergency room at once.  NOTE: This medicine is only for you. Do not share this medicine with others.  What if I miss a dose?  This medicine is used once a week if given by injection under the skin. If you miss a dose, take it as soon as you can. If it is almost time for your next dose, take only that dose. Do not take double or extra doses.  If you are to be given an infusion, it is important not to miss your dose. Doses are usually every 4 weeks. Call your doctor or health care professional if you are unable to keep an appointment.  What may interact with this medicine?  Do not take this medicine with any of the following medications:  -adalimumab  -anakinra  -certolizumab  -etanercept  -golimumab  -infliximab  -live virus vaccines  -rituximab  -tocilizumab  This medicine may also interact with the following medications:  -vaccines  This list may not describe all possible interactions. Give your health care provider a list of all the medicines, herbs, non-prescription drugs, or dietary supplements you use. Also tell them if you smoke, drink alcohol, or use illegal drugs. Some items may interact with your medicine.  What should I watch for while using this medicine?  Visit your doctor for regular check ups while you are taking this medicine. Tell your doctor or healthcare professional if your symptoms do not start to get better or if they   get worse.  Call your doctor or health care professional if you get a cold or other infection while receiving this medicine. Do not treat yourself. This medicine may decrease your body's ability to fight infection. Try to avoid being around people who are sick.  What side effects may I notice from receiving this medicine?  Side effects that you should report to your doctor or health care professional as soon as possible:  -allergic reactions like skin rash, itching or hives, swelling of the face, lips, or tongue  -breathing  problems  -chest pain  -signs of infection - fever or chills, cough, unusual tiredness, pain or trouble passing urine, or warm, red or painful skin  Side effects that usually do not require medical attention (report to your doctor or health care professional if they continue or are bothersome):  -dizziness  -headache  -nausea, vomiting  -sore throat  -stomach upset  This list may not describe all possible side effects. Call your doctor for medical advice about side effects. You may report side effects to FDA at 1-800-FDA-1088.  Where should I keep my medicine?  Infusions will be given in a hospital or clinic and will not be stored at home.  Storage for syringes given under the skin and stored at home:  Keep out of the reach of children. Store in a refrigerator between 2 and 8 degrees C (36 and 46 degrees F). Keep this medicine in the original container. Protect from light. Do not freeze. Throw away any unused medicine after the expiration date.  NOTE: This sheet is a summary. It may not cover all possible information. If you have questions about this medicine, talk to your doctor, pharmacist, or health care provider.   2018 Elsevier/Gold Standard (2016-07-01 10:07:35)

## 2017-04-08 ENCOUNTER — Ambulatory Visit: Payer: Medicare Other

## 2017-04-12 ENCOUNTER — Inpatient Hospital Stay: Payer: Medicare Other

## 2017-04-14 MED ORDER — SODIUM CHLORIDE 0.9 % IV SOLN
1000.0000 mg | INTRAVENOUS | Status: DC
  Administered 2017-04-15: 1000 mg via INTRAVENOUS
  Filled 2017-04-14: qty 40

## 2017-04-15 ENCOUNTER — Inpatient Hospital Stay: Payer: Medicare Other | Attending: Oncology

## 2017-04-15 VITALS — BP 107/61 | HR 64 | Temp 95.6°F | Resp 18

## 2017-04-15 DIAGNOSIS — Z79899 Other long term (current) drug therapy: Secondary | ICD-10-CM | POA: Insufficient documentation

## 2017-04-15 DIAGNOSIS — M069 Rheumatoid arthritis, unspecified: Secondary | ICD-10-CM | POA: Diagnosis not present

## 2017-04-15 MED ORDER — SODIUM CHLORIDE 0.9 % IV SOLN
Freq: Once | INTRAVENOUS | Status: AC
  Administered 2017-04-15: 11:00:00 via INTRAVENOUS
  Filled 2017-04-15: qty 1000

## 2017-04-15 NOTE — Patient Instructions (Signed)
Abatacept solution for injection (subcutaneous or intravenous use)  What is this medicine?  ABATACEPT (a ba TA sept) is used to treat moderate to severe active rheumatoid arthritis or psoriatic arthritis in adults. This medicine is also used to treat juvenile idiopathic arthritis.  This medicine may be used for other purposes; ask your health care provider or pharmacist if you have questions.  COMMON BRAND NAME(S): Orencia  What should I tell my health care provider before I take this medicine?  They need to know if you have any of these conditions:  -are taking other medicines to treat rheumatoid arthritis  -COPD  -diabetes  -infection or history of infections  -recently received or scheduled to receive a vaccine  -scheduled to have surgery  -tuberculosis, a positive skin test for tuberculosis or have recently been in close contact with someone who has tuberculosis  -viral hepatitis  -an unusual or allergic reaction to abatacept, other medicines, foods, dyes, or preservatives  -pregnant or trying to get pregnant  -breast-feeding  How should I use this medicine?  This medicine is for infusion into a vein or for injection under the skin. Infusions are given by a health care professional in a hospital or clinic setting. If you are to give your own medicine at home, you will be taught how to prepare and give this medicine under the skin. Use exactly as directed. Take your medicine at regular intervals. Do not take your medicine more often than directed.  It is important that you put your used needles and syringes in a special sharps container. Do not put them in a trash can. If you do not have a sharps container, call your pharmacist or healthcare provider to get one.  Talk to your pediatrician regarding the use of this medicine in children. While infusions in a clinic may be prescribed for children as young as 2 years for selected conditions, precautions do apply.  Overdosage: If you think you have taken too much of  this medicine contact a poison control center or emergency room at once.  NOTE: This medicine is only for you. Do not share this medicine with others.  What if I miss a dose?  This medicine is used once a week if given by injection under the skin. If you miss a dose, take it as soon as you can. If it is almost time for your next dose, take only that dose. Do not take double or extra doses.  If you are to be given an infusion, it is important not to miss your dose. Doses are usually every 4 weeks. Call your doctor or health care professional if you are unable to keep an appointment.  What may interact with this medicine?  Do not take this medicine with any of the following medications:  -adalimumab  -anakinra  -certolizumab  -etanercept  -golimumab  -infliximab  -live virus vaccines  -rituximab  -tocilizumab  This medicine may also interact with the following medications:  -vaccines  This list may not describe all possible interactions. Give your health care provider a list of all the medicines, herbs, non-prescription drugs, or dietary supplements you use. Also tell them if you smoke, drink alcohol, or use illegal drugs. Some items may interact with your medicine.  What should I watch for while using this medicine?  Visit your doctor for regular check ups while you are taking this medicine. Tell your doctor or healthcare professional if your symptoms do not start to get better or if they   get worse.  Call your doctor or health care professional if you get a cold or other infection while receiving this medicine. Do not treat yourself. This medicine may decrease your body's ability to fight infection. Try to avoid being around people who are sick.  What side effects may I notice from receiving this medicine?  Side effects that you should report to your doctor or health care professional as soon as possible:  -allergic reactions like skin rash, itching or hives, swelling of the face, lips, or tongue  -breathing  problems  -chest pain  -signs of infection - fever or chills, cough, unusual tiredness, pain or trouble passing urine, or warm, red or painful skin  Side effects that usually do not require medical attention (report to your doctor or health care professional if they continue or are bothersome):  -dizziness  -headache  -nausea, vomiting  -sore throat  -stomach upset  This list may not describe all possible side effects. Call your doctor for medical advice about side effects. You may report side effects to FDA at 1-800-FDA-1088.  Where should I keep my medicine?  Infusions will be given in a hospital or clinic and will not be stored at home.  Storage for syringes given under the skin and stored at home:  Keep out of the reach of children. Store in a refrigerator between 2 and 8 degrees C (36 and 46 degrees F). Keep this medicine in the original container. Protect from light. Do not freeze. Throw away any unused medicine after the expiration date.  NOTE: This sheet is a summary. It may not cover all possible information. If you have questions about this medicine, talk to your doctor, pharmacist, or health care provider.   2018 Elsevier/Gold Standard (2016-07-01 10:07:35)

## 2017-05-06 ENCOUNTER — Ambulatory Visit: Payer: Medicare Other

## 2017-05-10 ENCOUNTER — Inpatient Hospital Stay: Payer: Medicare Other

## 2017-05-12 MED ORDER — SODIUM CHLORIDE 0.9 % IV SOLN
Freq: Once | INTRAVENOUS | Status: AC
  Administered 2017-05-13: 09:00:00 via INTRAVENOUS
  Filled 2017-05-12: qty 1000

## 2017-05-12 MED ORDER — SODIUM CHLORIDE 0.9 % IV SOLN
1000.0000 mg | INTRAVENOUS | Status: DC
  Administered 2017-05-13: 1000 mg via INTRAVENOUS
  Filled 2017-05-12: qty 40

## 2017-05-13 ENCOUNTER — Inpatient Hospital Stay: Payer: Medicare Other | Attending: Oncology

## 2017-05-13 VITALS — BP 121/68 | HR 58 | Temp 97.8°F | Resp 18

## 2017-05-13 DIAGNOSIS — Z79899 Other long term (current) drug therapy: Secondary | ICD-10-CM | POA: Insufficient documentation

## 2017-05-13 DIAGNOSIS — M069 Rheumatoid arthritis, unspecified: Secondary | ICD-10-CM | POA: Diagnosis present

## 2017-05-27 DIAGNOSIS — I719 Aortic aneurysm of unspecified site, without rupture: Secondary | ICD-10-CM

## 2017-05-27 HISTORY — PX: OTHER SURGICAL HISTORY: SHX169

## 2017-05-27 HISTORY — DX: Aortic aneurysm of unspecified site, without rupture: I71.9

## 2017-06-03 ENCOUNTER — Ambulatory Visit: Payer: Medicare Other

## 2017-06-07 ENCOUNTER — Inpatient Hospital Stay: Payer: Medicare Other

## 2017-06-09 MED ORDER — SODIUM CHLORIDE 0.9 % IV SOLN
Freq: Once | INTRAVENOUS | Status: AC
  Administered 2017-06-10: 12:00:00 via INTRAVENOUS
  Filled 2017-06-09: qty 1000

## 2017-06-09 MED ORDER — SODIUM CHLORIDE 0.9 % IV SOLN
1000.0000 mg | INTRAVENOUS | Status: DC
  Administered 2017-06-10: 1000 mg via INTRAVENOUS
  Filled 2017-06-09: qty 40

## 2017-06-10 ENCOUNTER — Inpatient Hospital Stay: Payer: Medicare Other | Attending: Oncology

## 2017-06-10 VITALS — BP 129/75 | HR 66 | Resp 18

## 2017-06-10 DIAGNOSIS — Z79899 Other long term (current) drug therapy: Secondary | ICD-10-CM | POA: Diagnosis not present

## 2017-06-10 DIAGNOSIS — M069 Rheumatoid arthritis, unspecified: Secondary | ICD-10-CM | POA: Insufficient documentation

## 2017-06-10 NOTE — Patient Instructions (Signed)
Abatacept solution for injection (subcutaneous or intravenous use)  What is this medicine?  ABATACEPT (a ba TA sept) is used to treat moderate to severe active rheumatoid arthritis or psoriatic arthritis in adults. This medicine is also used to treat juvenile idiopathic arthritis.  This medicine may be used for other purposes; ask your health care provider or pharmacist if you have questions.  COMMON BRAND NAME(S): Orencia  What should I tell my health care provider before I take this medicine?  They need to know if you have any of these conditions:  -are taking other medicines to treat rheumatoid arthritis  -COPD  -diabetes  -infection or history of infections  -recently received or scheduled to receive a vaccine  -scheduled to have surgery  -tuberculosis, a positive skin test for tuberculosis or have recently been in close contact with someone who has tuberculosis  -viral hepatitis  -an unusual or allergic reaction to abatacept, other medicines, foods, dyes, or preservatives  -pregnant or trying to get pregnant  -breast-feeding  How should I use this medicine?  This medicine is for infusion into a vein or for injection under the skin. Infusions are given by a health care professional in a hospital or clinic setting. If you are to give your own medicine at home, you will be taught how to prepare and give this medicine under the skin. Use exactly as directed. Take your medicine at regular intervals. Do not take your medicine more often than directed.  It is important that you put your used needles and syringes in a special sharps container. Do not put them in a trash can. If you do not have a sharps container, call your pharmacist or healthcare provider to get one.  Talk to your pediatrician regarding the use of this medicine in children. While infusions in a clinic may be prescribed for children as young as 2 years for selected conditions, precautions do apply.  Overdosage: If you think you have taken too much of  this medicine contact a poison control center or emergency room at once.  NOTE: This medicine is only for you. Do not share this medicine with others.  What if I miss a dose?  This medicine is used once a week if given by injection under the skin. If you miss a dose, take it as soon as you can. If it is almost time for your next dose, take only that dose. Do not take double or extra doses.  If you are to be given an infusion, it is important not to miss your dose. Doses are usually every 4 weeks. Call your doctor or health care professional if you are unable to keep an appointment.  What may interact with this medicine?  Do not take this medicine with any of the following medications:  -adalimumab  -anakinra  -certolizumab  -etanercept  -golimumab  -infliximab  -live virus vaccines  -rituximab  -tocilizumab  This medicine may also interact with the following medications:  -vaccines  This list may not describe all possible interactions. Give your health care provider a list of all the medicines, herbs, non-prescription drugs, or dietary supplements you use. Also tell them if you smoke, drink alcohol, or use illegal drugs. Some items may interact with your medicine.  What should I watch for while using this medicine?  Visit your doctor for regular check ups while you are taking this medicine. Tell your doctor or healthcare professional if your symptoms do not start to get better or if they   get worse.  Call your doctor or health care professional if you get a cold or other infection while receiving this medicine. Do not treat yourself. This medicine may decrease your body's ability to fight infection. Try to avoid being around people who are sick.  What side effects may I notice from receiving this medicine?  Side effects that you should report to your doctor or health care professional as soon as possible:  -allergic reactions like skin rash, itching or hives, swelling of the face, lips, or tongue  -breathing  problems  -chest pain  -signs of infection - fever or chills, cough, unusual tiredness, pain or trouble passing urine, or warm, red or painful skin  Side effects that usually do not require medical attention (report to your doctor or health care professional if they continue or are bothersome):  -dizziness  -headache  -nausea, vomiting  -sore throat  -stomach upset  This list may not describe all possible side effects. Call your doctor for medical advice about side effects. You may report side effects to FDA at 1-800-FDA-1088.  Where should I keep my medicine?  Infusions will be given in a hospital or clinic and will not be stored at home.  Storage for syringes given under the skin and stored at home:  Keep out of the reach of children. Store in a refrigerator between 2 and 8 degrees C (36 and 46 degrees F). Keep this medicine in the original container. Protect from light. Do not freeze. Throw away any unused medicine after the expiration date.  NOTE: This sheet is a summary. It may not cover all possible information. If you have questions about this medicine, talk to your doctor, pharmacist, or health care provider.   2018 Elsevier/Gold Standard (2016-07-01 10:07:35)

## 2017-07-01 ENCOUNTER — Ambulatory Visit: Payer: Medicare Other

## 2017-07-05 ENCOUNTER — Inpatient Hospital Stay: Payer: Medicare Other

## 2017-07-05 NOTE — Progress Notes (Signed)
[name]  Telephone:(336) [phone] Fax:(336) [phone]  ID: Dan Europe OB: [birth year]  MR#: [id number]  VOZ#:[id number]  Patient Care Team: Hulan Fess, MD as PCP - General (Family Medicine)  CHIEF COMPLAINT: Orencia infusion for rheumatoid arthritis  INTERVAL HISTORY: Patient presents to clinic today as routine six-month followup.  He continues to take Orencia every 4 weeks with good control of his pain from rheumatoid arthritis. He recently had several intravascular stents placed in his aorta and iliacs. Patient states he is starting Plavix in the next 1-2 days. He currently feels well and is asymptomatic.  He had no recent fevers or illnesses.  He denies any chest pain or shortness of breath. He has a good appetite and denies weight loss. He has no nausea, vomiting, constipation, or diarrhea. He has no urinary complaints. Patient offers no specific complaints today.   REVIEW OF SYSTEMS:   Review of Systems  Constitutional: Negative for fever and malaise/fatigue.  Respiratory: Negative.  Negative for cough and shortness of breath.   Cardiovascular: Negative.  Negative for chest pain.  Gastrointestinal: Negative.  Negative for abdominal pain.  Genitourinary: Negative.   Musculoskeletal: Negative.  Negative for back pain, joint pain and neck pain.  Neurological: Negative.  Negative for weakness.  Psychiatric/Behavioral: Negative.     As per HPI. Otherwise, a complete review of systems is negative.  PAST MEDICAL HISTORY: Past Medical History:  Diagnosis Date  . Aortic aneurysm ([place]) 05/2017  . Chronic back pain   . Dyslipidemia   . GERD (gastroesophageal reflux disease)   . H/O hiatal hernia   . Hypertension   . Melanoma ([place])    "back; left arm"  . PONV (postoperative nausea and vomiting)   . Rheumatoid arthritis ([place])   . Spasm of esophagus     PAST SURGICAL HISTORY: Past Surgical History:  Procedure Laterality Date  . aneursym repair      . aneursym repair and stent placement  05/2017  . CARPAL TUNNEL RELEASE Bilateral   . COLON SURGERY  2007   removed 18" of colon  . ERCP W/ METAL STENT PLACEMENT  11/2005   Archie Endo 12/14/2005  . ESOPHAGOGASTRODUODENOSCOPY (EGD) WITH ESOPHAGEAL DILATION     "several times'  . HAND SURGERY Bilateral    "plastic knuckles"  . LAPAROSCOPIC CHOLECYSTECTOMY  01/2006  . MELANOMA EXCISION     "back; left arm"  . NASAL SEPTUM SURGERY    . PELVIC FRACTURE SURGERY  1964   "busted in 2 places"  . RESECTION DISTAL CLAVICAL Right 05/13/2016   Procedure: DISTAL CLAVICLE EXCISION;  Surgeon: Ninetta Lights, MD;  Location: [place];  Service: Orthopedics;  Laterality: Right;  . SHOULDER ARTHROSCOPY WITH ROTATOR CUFF REPAIR AND SUBACROMIAL DECOMPRESSION Right 05/13/2016   Procedure: RIGHT SHOULDER SCOPE DEBRIDEMENT, ACROMIOPLASTY, ROTATOR CUFF REPAIR; RELEASE BICEPS TENDON AND DEBRIDEMENT LABRUM;  Surgeon: Ninetta Lights, MD;  Location: [place];  Service: Orthopedics;  Laterality: Right;  . TIBIA FRACTURE SURGERY Left 2006   "put a steel rod in it"    FAMILY HISTORY Family History  Problem Relation Age of Onset  . Heart disease Mother   . Heart disease Father   . Cancer Maternal Grandmother        ADVANCED DIRECTIVES:    HEALTH MAINTENANCE: Social History  Substance Use Topics  . Smoking status: Former Smoker    Packs/day: 1.00    Years: 46.00    Types: Cigarettes    Quit date:  11/26/2005  . Smokeless tobacco: Never Used  . Alcohol use 5.4 oz/week    9 Shots of liquor per week     Comment: 05/23/2014 "3, 1 shot drinks maybe 3 times/wk"     Colonoscopy:  PAP:  Bone density:  Lipid panel:  No Known Allergies  Current Outpatient Prescriptions  Medication Sig Dispense Refill  . abatacept (ORENCIA) 250 MG injection Inject into the vein every 30 (thirty) days.    Marland Kitchen aspirin 325 MG tablet Take 325 mg by mouth daily.    Marland Kitchen atorvastatin (LIPITOR) 20 MG  tablet Take 20 mg by mouth daily.    . fluticasone (FLONASE) 50 MCG/ACT nasal spray Place 2 sprays into the nose daily.    . folic acid (FOLVITE) 1 MG tablet Take 1 mg by mouth daily.    . magnesium oxide (MAG-OX) 400 MG tablet Take 800 mg by mouth 3 (three) times daily.    . methotrexate (RHEUMATREX) 2.5 MG tablet Take 2.5 mg by mouth once a week.    Marland Kitchen omeprazole (PRILOSEC) 20 MG capsule Take 20 mg by mouth daily.     Marland Kitchen oxyCODONE-acetaminophen (ROXICET) 5-325 MG tablet Take 1-2 tablets by mouth every 4 (four) hours as needed. 60 tablet 0  . potassium chloride (K-DUR) 10 MEQ tablet Take 10 mEq by mouth daily.     . predniSONE (DELTASONE) 5 MG tablet Take 5 mg by mouth daily with breakfast.    . tadalafil (CIALIS) 5 MG tablet Take 5 mg by mouth daily.    Marland Kitchen azelastine (ASTELIN) 137 MCG/SPRAY nasal spray Place 1 spray into the nose 2 (two) times daily. Use in each nostril as directed     No current facility-administered medications for this visit.    Facility-Administered Medications Ordered in Other Visits  Medication Dose Route Frequency Provider Last Rate Last Dose  . 0.9 %  sodium chloride infusion   Intravenous Once Lloyd Huger, MD      . 0.9 %  sodium chloride infusion   Intravenous Once Lloyd Huger, MD      . abatacept [facility]) 1,000 mg in sodium chloride 0.9 % 100 mL IVPB  1,000 mg Intravenous Q28 days Lloyd Huger, MD      . abatacept [facility]) 1,000 mg in sodium chloride 0.9 % 100 mL IVPB  1,000 mg Intravenous Q28 days Lloyd Huger, MD        OBJECTIVE: Vitals:   07/08/17 0950  BP: 126/60  Pulse: 64  Resp: 18  Temp: (!) 97.5 F (36.4 C)     There is no height or weight on file to calculate BMI.    ECOG FS:0 - Asymptomatic  General: Well-developed, well-nourished, no acute distress. Eyes: Pink conjunctiva, anicteric sclera. Lungs: Clear to auscultation bilaterally. Heart: Regular rate and rhythm. No rubs, murmurs, or gallops. Abdomen: Soft,  nontender, nondistended. No organomegaly noted, normoactive bowel sounds. Musculoskeletal: No edema, cyanosis, or clubbing. Neuro: Alert, answering all questions appropriately. Cranial nerves grossly intact. Skin: No rashes or petechiae noted. Psych: Normal affect.   LAB RESULTS:  Lab Results  Component Value Date   NA 132 (L) 05/07/2016   K 3.8 05/07/2016   CL 96 (L) 05/07/2016   CO2 24 05/07/2016   GLUCOSE 131 (H) 05/07/2016   BUN 12 05/07/2016   CREATININE 0.98 05/07/2016   CALCIUM 9.3 05/07/2016   PROT 6.9 05/28/2015   ALBUMIN 3.7 05/28/2015   AST 18 05/28/2015   ALT 13 (L) 05/28/2015  ALKPHOS 72 05/28/2015   BILITOT 0.8 05/28/2015   GFRNONAA >60 05/07/2016   GFRAA >60 05/07/2016    Lab Results  Component Value Date   WBC 11.7 (H) 05/28/2015   NEUTROABS 10.1 (H) 05/28/2015   HGB 11.4 (L) 05/28/2015   HCT 33.5 (L) 05/28/2015   MCV 91.3 05/28/2015   PLT 173 05/28/2015     STUDIES: No results found.  ASSESSMENT: Orencia infusion for rheumatoid arthritis  PLAN:    1.  RA:  Continue with Orencia 1000 mg IV q 4 weeks per Dr. Francella Solian. Clair's orders.  Patient understands that if he has questions or concerns regarding his rheumatoid arthritis or Orencia he should defer them his primary rheumatologist.  All laboratory work will also be monitored by his rheumatologist.  Return to clinic in 6 months for routine evaluation.  2. 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 Treatment per [facility]. 3. Vascular: Continue Plavix as prescribed.   Patient expressed understanding and was in agreement with this plan. He also understands that He can call clinic at any time with any questions, concerns, or complaints.    Lloyd Huger, MD   07/08/2017 10:15 AM

## 2017-07-08 ENCOUNTER — Inpatient Hospital Stay (HOSPITAL_BASED_OUTPATIENT_CLINIC_OR_DEPARTMENT_OTHER): Payer: Medicare Other | Admitting: Oncology

## 2017-07-08 ENCOUNTER — Encounter: Payer: Self-pay | Admitting: Oncology

## 2017-07-08 ENCOUNTER — Ambulatory Visit: Payer: Medicare Other | Admitting: Oncology

## 2017-07-08 ENCOUNTER — Inpatient Hospital Stay: Payer: Medicare Other | Attending: Oncology

## 2017-07-08 VITALS — BP 126/60 | HR 64 | Temp 97.5°F | Resp 18

## 2017-07-08 DIAGNOSIS — G8929 Other chronic pain: Secondary | ICD-10-CM

## 2017-07-08 DIAGNOSIS — E785 Hyperlipidemia, unspecified: Secondary | ICD-10-CM | POA: Insufficient documentation

## 2017-07-08 DIAGNOSIS — Z79899 Other long term (current) drug therapy: Secondary | ICD-10-CM

## 2017-07-08 DIAGNOSIS — K219 Gastro-esophageal reflux disease without esophagitis: Secondary | ICD-10-CM

## 2017-07-08 DIAGNOSIS — M549 Dorsalgia, unspecified: Secondary | ICD-10-CM

## 2017-07-08 DIAGNOSIS — Z85820 Personal history of malignant melanoma of skin: Secondary | ICD-10-CM | POA: Insufficient documentation

## 2017-07-08 DIAGNOSIS — Z7982 Long term (current) use of aspirin: Secondary | ICD-10-CM | POA: Diagnosis not present

## 2017-07-08 DIAGNOSIS — I10 Essential (primary) hypertension: Secondary | ICD-10-CM | POA: Diagnosis not present

## 2017-07-08 DIAGNOSIS — I714 Abdominal aortic aneurysm, without rupture: Secondary | ICD-10-CM | POA: Insufficient documentation

## 2017-07-08 DIAGNOSIS — Z87891 Personal history of nicotine dependence: Secondary | ICD-10-CM | POA: Insufficient documentation

## 2017-07-08 DIAGNOSIS — K449 Diaphragmatic hernia without obstruction or gangrene: Secondary | ICD-10-CM

## 2017-07-08 DIAGNOSIS — Z803 Family history of malignant neoplasm of breast: Secondary | ICD-10-CM

## 2017-07-08 DIAGNOSIS — M069 Rheumatoid arthritis, unspecified: Secondary | ICD-10-CM | POA: Diagnosis present

## 2017-07-08 DIAGNOSIS — Z8589 Personal history of malignant neoplasm of other organs and systems: Secondary | ICD-10-CM | POA: Insufficient documentation

## 2017-07-08 MED ORDER — SODIUM CHLORIDE 0.9 % IV SOLN
Freq: Once | INTRAVENOUS | Status: AC
  Administered 2017-07-08: 11:00:00 via INTRAVENOUS
  Filled 2017-07-08: qty 1000

## 2017-07-08 MED ORDER — SODIUM CHLORIDE 0.9 % IV SOLN
1000.0000 mg | INTRAVENOUS | Status: DC
  Administered 2017-07-08: 1000 mg via INTRAVENOUS
  Filled 2017-07-08: qty 40

## 2017-07-08 NOTE — Patient Instructions (Signed)
Abatacept solution for injection (subcutaneous or intravenous use)  What is this medicine?  ABATACEPT (a ba TA sept) is used to treat moderate to severe active rheumatoid arthritis or psoriatic arthritis in adults. This medicine is also used to treat juvenile idiopathic arthritis.  This medicine may be used for other purposes; ask your health care provider or pharmacist if you have questions.  COMMON BRAND NAME(S): Orencia  What should I tell my health care provider before I take this medicine?  They need to know if you have any of these conditions:  -are taking other medicines to treat rheumatoid arthritis  -COPD  -diabetes  -infection or history of infections  -recently received or scheduled to receive a vaccine  -scheduled to have surgery  -tuberculosis, a positive skin test for tuberculosis or have recently been in close contact with someone who has tuberculosis  -viral hepatitis  -an unusual or allergic reaction to abatacept, other medicines, foods, dyes, or preservatives  -pregnant or trying to get pregnant  -breast-feeding  How should I use this medicine?  This medicine is for infusion into a vein or for injection under the skin. Infusions are given by a health care professional in a hospital or clinic setting. If you are to give your own medicine at home, you will be taught how to prepare and give this medicine under the skin. Use exactly as directed. Take your medicine at regular intervals. Do not take your medicine more often than directed.  It is important that you put your used needles and syringes in a special sharps container. Do not put them in a trash can. If you do not have a sharps container, call your pharmacist or healthcare provider to get one.  Talk to your pediatrician regarding the use of this medicine in children. While infusions in a clinic may be prescribed for children as young as 2 years for selected conditions, precautions do apply.  Overdosage: If you think you have taken too much of  this medicine contact a poison control center or emergency room at once.  NOTE: This medicine is only for you. Do not share this medicine with others.  What if I miss a dose?  This medicine is used once a week if given by injection under the skin. If you miss a dose, take it as soon as you can. If it is almost time for your next dose, take only that dose. Do not take double or extra doses.  If you are to be given an infusion, it is important not to miss your dose. Doses are usually every 4 weeks. Call your doctor or health care professional if you are unable to keep an appointment.  What may interact with this medicine?  Do not take this medicine with any of the following medications:  -adalimumab  -anakinra  -certolizumab  -etanercept  -golimumab  -infliximab  -live virus vaccines  -rituximab  -tocilizumab  This medicine may also interact with the following medications:  -vaccines  This list may not describe all possible interactions. Give your health care provider a list of all the medicines, herbs, non-prescription drugs, or dietary supplements you use. Also tell them if you smoke, drink alcohol, or use illegal drugs. Some items may interact with your medicine.  What should I watch for while using this medicine?  Visit your doctor for regular check ups while you are taking this medicine. Tell your doctor or healthcare professional if your symptoms do not start to get better or if they   get worse.  Call your doctor or health care professional if you get a cold or other infection while receiving this medicine. Do not treat yourself. This medicine may decrease your body's ability to fight infection. Try to avoid being around people who are sick.  What side effects may I notice from receiving this medicine?  Side effects that you should report to your doctor or health care professional as soon as possible:  -allergic reactions like skin rash, itching or hives, swelling of the face, lips, or tongue  -breathing  problems  -chest pain  -signs of infection - fever or chills, cough, unusual tiredness, pain or trouble passing urine, or warm, red or painful skin  Side effects that usually do not require medical attention (report to your doctor or health care professional if they continue or are bothersome):  -dizziness  -headache  -nausea, vomiting  -sore throat  -stomach upset  This list may not describe all possible side effects. Call your doctor for medical advice about side effects. You may report side effects to FDA at 1-800-FDA-1088.  Where should I keep my medicine?  Infusions will be given in a hospital or clinic and will not be stored at home.  Storage for syringes given under the skin and stored at home:  Keep out of the reach of children. Store in a refrigerator between 2 and 8 degrees C (36 and 46 degrees F). Keep this medicine in the original container. Protect from light. Do not freeze. Throw away any unused medicine after the expiration date.  NOTE: This sheet is a summary. It may not cover all possible information. If you have questions about this medicine, talk to your doctor, pharmacist, or health care provider.   2018 Elsevier/Gold Standard (2016-07-01 10:07:35)

## 2017-07-08 NOTE — Progress Notes (Signed)
Pt had aortic anuersym and had it repaired 05/2017.

## 2017-08-02 ENCOUNTER — Inpatient Hospital Stay: Payer: Medicare Other

## 2017-08-04 MED ORDER — SODIUM CHLORIDE 0.9 % IV SOLN
Freq: Once | INTRAVENOUS | Status: AC
  Administered 2017-08-05: 09:00:00 via INTRAVENOUS
  Filled 2017-08-04: qty 1000

## 2017-08-04 MED ORDER — SODIUM CHLORIDE 0.9 % IV SOLN
1000.0000 mg | INTRAVENOUS | Status: DC
  Administered 2017-08-05: 1000 mg via INTRAVENOUS
  Filled 2017-08-04: qty 40

## 2017-08-05 ENCOUNTER — Inpatient Hospital Stay: Payer: Medicare Other | Attending: Oncology

## 2017-08-05 VITALS — BP 138/79 | HR 62 | Temp 96.3°F | Resp 18

## 2017-08-05 DIAGNOSIS — Z79899 Other long term (current) drug therapy: Secondary | ICD-10-CM | POA: Insufficient documentation

## 2017-08-05 DIAGNOSIS — M069 Rheumatoid arthritis, unspecified: Secondary | ICD-10-CM | POA: Diagnosis not present

## 2017-09-02 ENCOUNTER — Inpatient Hospital Stay: Payer: Medicare Other | Attending: Oncology

## 2017-09-02 VITALS — BP 125/66 | HR 59 | Temp 97.0°F | Resp 18

## 2017-09-02 DIAGNOSIS — M069 Rheumatoid arthritis, unspecified: Secondary | ICD-10-CM | POA: Diagnosis not present

## 2017-09-02 DIAGNOSIS — Z79899 Other long term (current) drug therapy: Secondary | ICD-10-CM | POA: Diagnosis not present

## 2017-09-02 MED ORDER — SODIUM CHLORIDE 0.9 % IV SOLN
1000.0000 mg | INTRAVENOUS | Status: DC
  Administered 2017-09-02: 1000 mg via INTRAVENOUS
  Filled 2017-09-02: qty 40

## 2017-09-02 MED ORDER — SODIUM CHLORIDE 0.9 % IV SOLN
Freq: Once | INTRAVENOUS | Status: AC
  Administered 2017-09-02: 09:00:00 via INTRAVENOUS
  Filled 2017-09-02: qty 1000

## 2017-09-29 ENCOUNTER — Inpatient Hospital Stay: Payer: Medicare Other

## 2017-09-29 MED ORDER — SODIUM CHLORIDE 0.9 % IV SOLN
Freq: Once | INTRAVENOUS | Status: DC
  Filled 2017-09-29: qty 1000

## 2017-09-29 MED ORDER — ABATACEPT 250 MG IV SOLR
1000.0000 mg | INTRAVENOUS | Status: DC
  Filled 2017-09-29 (×3): qty 40

## 2017-09-30 ENCOUNTER — Inpatient Hospital Stay: Payer: Medicare Other

## 2017-10-03 ENCOUNTER — Inpatient Hospital Stay: Payer: Medicare Other | Attending: Oncology

## 2017-10-03 DIAGNOSIS — Z79899 Other long term (current) drug therapy: Secondary | ICD-10-CM | POA: Insufficient documentation

## 2017-10-03 DIAGNOSIS — M069 Rheumatoid arthritis, unspecified: Secondary | ICD-10-CM | POA: Diagnosis present

## 2017-10-03 MED ORDER — SODIUM CHLORIDE 0.9 % IV SOLN
Freq: Once | INTRAVENOUS | Status: AC
  Administered 2017-10-03: 10:00:00 via INTRAVENOUS
  Filled 2017-10-03: qty 1000

## 2017-10-03 MED ORDER — SODIUM CHLORIDE 0.9 % IV SOLN
1000.0000 mg | INTRAVENOUS | Status: DC
  Administered 2017-10-03: 1000 mg via INTRAVENOUS
  Filled 2017-10-03: qty 40

## 2017-10-27 MED ORDER — SODIUM CHLORIDE 0.9 % IV SOLN
1000.0000 mg | INTRAVENOUS | Status: DC
  Administered 2017-10-28: 1000 mg via INTRAVENOUS
  Filled 2017-10-27: qty 40

## 2017-10-28 ENCOUNTER — Inpatient Hospital Stay: Payer: Medicare Other | Attending: Oncology

## 2017-10-28 VITALS — BP 160/70 | HR 85 | Temp 96.0°F | Resp 18

## 2017-10-28 DIAGNOSIS — Z79899 Other long term (current) drug therapy: Secondary | ICD-10-CM | POA: Insufficient documentation

## 2017-10-28 DIAGNOSIS — M069 Rheumatoid arthritis, unspecified: Secondary | ICD-10-CM | POA: Diagnosis present

## 2017-10-28 MED ORDER — SODIUM CHLORIDE 0.9 % IV SOLN
Freq: Once | INTRAVENOUS | Status: AC
  Administered 2017-10-28: 09:00:00 via INTRAVENOUS
  Filled 2017-10-28: qty 1000

## 2017-10-28 NOTE — Progress Notes (Signed)
Patient came in not feeling well. C/o sinus problems. VSS afebrile. During Orencia infusion patient coughed so hard he began vomiting. Offered to stop Orencia infusion, but patient wanted to get infusion. Gave him the full 251ml  NS back up bag for hydration.

## 2017-11-23 ENCOUNTER — Telehealth: Payer: Self-pay | Admitting: *Deleted

## 2017-11-23 NOTE — Telephone Encounter (Signed)
Anderson Malta at Dr. Laural Golden (DDS) office called requesting advice regarding Orencia and tooth extraction. I gave her contact information for patients rheumatologist, Dr. Remer Macho who manages his rheumatoid arthritis and Orencia dosing for further recommendations.

## 2017-11-24 MED ORDER — SODIUM CHLORIDE 0.9 % IV SOLN
1000.0000 mg | INTRAVENOUS | Status: DC
  Administered 2017-11-25: 1000 mg via INTRAVENOUS
  Filled 2017-11-24: qty 40

## 2017-11-24 MED ORDER — SODIUM CHLORIDE 0.9 % IV SOLN
Freq: Once | INTRAVENOUS | Status: AC
  Administered 2017-11-25: 09:00:00 via INTRAVENOUS
  Filled 2017-11-24: qty 1000

## 2017-11-25 ENCOUNTER — Inpatient Hospital Stay: Payer: Medicare Other

## 2017-11-25 VITALS — BP 132/71 | HR 63 | Temp 96.8°F | Resp 18

## 2017-11-25 DIAGNOSIS — M069 Rheumatoid arthritis, unspecified: Secondary | ICD-10-CM

## 2017-12-22 MED ORDER — SODIUM CHLORIDE 0.9 % IV SOLN
1000.0000 mg | INTRAVENOUS | Status: DC
  Administered 2017-12-23: 1000 mg via INTRAVENOUS
  Filled 2017-12-22: qty 40

## 2017-12-22 MED ORDER — SODIUM CHLORIDE 0.9 % IV SOLN
Freq: Once | INTRAVENOUS | Status: AC
  Administered 2017-12-23: 09:00:00 via INTRAVENOUS
  Filled 2017-12-22: qty 1000

## 2017-12-23 ENCOUNTER — Inpatient Hospital Stay: Payer: Medicare Other | Attending: Oncology

## 2017-12-23 VITALS — BP 125/71 | HR 60 | Temp 97.2°F | Resp 18

## 2017-12-23 DIAGNOSIS — M069 Rheumatoid arthritis, unspecified: Secondary | ICD-10-CM | POA: Insufficient documentation

## 2017-12-23 DIAGNOSIS — Z79899 Other long term (current) drug therapy: Secondary | ICD-10-CM | POA: Diagnosis not present

## 2017-12-23 NOTE — Patient Instructions (Signed)
Abatacept solution for injection (subcutaneous or intravenous use)  What is this medicine?  ABATACEPT (a ba TA sept) is used to treat moderate to severe active rheumatoid arthritis or psoriatic arthritis in adults. This medicine is also used to treat juvenile idiopathic arthritis.  This medicine may be used for other purposes; ask your health care provider or pharmacist if you have questions.  COMMON BRAND NAME(S): Orencia  What should I tell my health care provider before I take this medicine?  They need to know if you have any of these conditions:  -are taking other medicines to treat rheumatoid arthritis  -COPD  -diabetes  -infection or history of infections  -recently received or scheduled to receive a vaccine  -scheduled to have surgery  -tuberculosis, a positive skin test for tuberculosis or have recently been in close contact with someone who has tuberculosis  -viral hepatitis  -an unusual or allergic reaction to abatacept, other medicines, foods, dyes, or preservatives  -pregnant or trying to get pregnant  -breast-feeding  How should I use this medicine?  This medicine is for infusion into a vein or for injection under the skin. Infusions are given by a health care professional in a hospital or clinic setting. If you are to give your own medicine at home, you will be taught how to prepare and give this medicine under the skin. Use exactly as directed. Take your medicine at regular intervals. Do not take your medicine more often than directed.  It is important that you put your used needles and syringes in a special sharps container. Do not put them in a trash can. If you do not have a sharps container, call your pharmacist or healthcare provider to get one.  Talk to your pediatrician regarding the use of this medicine in children. While infusions in a clinic may be prescribed for children as young as 2 years for selected conditions, precautions do apply.  Overdosage: If you think you have taken too much of  this medicine contact a poison control center or emergency room at once.  NOTE: This medicine is only for you. Do not share this medicine with others.  What if I miss a dose?  This medicine is used once a week if given by injection under the skin. If you miss a dose, take it as soon as you can. If it is almost time for your next dose, take only that dose. Do not take double or extra doses.  If you are to be given an infusion, it is important not to miss your dose. Doses are usually every 4 weeks. Call your doctor or health care professional if you are unable to keep an appointment.  What may interact with this medicine?  Do not take this medicine with any of the following medications:  -adalimumab  -anakinra  -certolizumab  -etanercept  -golimumab  -infliximab  -live virus vaccines  -rituximab  -tocilizumab  This medicine may also interact with the following medications:  -vaccines  This list may not describe all possible interactions. Give your health care provider a list of all the medicines, herbs, non-prescription drugs, or dietary supplements you use. Also tell them if you smoke, drink alcohol, or use illegal drugs. Some items may interact with your medicine.  What should I watch for while using this medicine?  Visit your doctor for regular check ups while you are taking this medicine. Tell your doctor or healthcare professional if your symptoms do not start to get better or if they   get worse.  Call your doctor or health care professional if you get a cold or other infection while receiving this medicine. Do not treat yourself. This medicine may decrease your body's ability to fight infection. Try to avoid being around people who are sick.  What side effects may I notice from receiving this medicine?  Side effects that you should report to your doctor or health care professional as soon as possible:  -allergic reactions like skin rash, itching or hives, swelling of the face, lips, or tongue  -breathing  problems  -chest pain  -signs of infection - fever or chills, cough, unusual tiredness, pain or trouble passing urine, or warm, red or painful skin  Side effects that usually do not require medical attention (report to your doctor or health care professional if they continue or are bothersome):  -dizziness  -headache  -nausea, vomiting  -sore throat  -stomach upset  This list may not describe all possible side effects. Call your doctor for medical advice about side effects. You may report side effects to FDA at 1-800-FDA-1088.  Where should I keep my medicine?  Infusions will be given in a hospital or clinic and will not be stored at home.  Storage for syringes given under the skin and stored at home:  Keep out of the reach of children. Store in a refrigerator between 2 and 8 degrees C (36 and 46 degrees F). Keep this medicine in the original container. Protect from light. Do not freeze. Throw away any unused medicine after the expiration date.  NOTE: This sheet is a summary. It may not cover all possible information. If you have questions about this medicine, talk to your doctor, pharmacist, or health care provider.   2018 Elsevier/Gold Standard (2016-07-01 10:07:35)

## 2018-01-20 ENCOUNTER — Ambulatory Visit: Payer: Medicare Other | Admitting: Oncology

## 2018-01-20 ENCOUNTER — Ambulatory Visit: Payer: Medicare Other

## 2018-01-22 NOTE — Progress Notes (Signed)
[place]  [phone] [phone]  ID: Dan Europe OB: 06-10-1946  MR#: [id number]  BOF#:[id number]  Patient Care Team: Elaina Pattee, MD as PCP - General (Family Medicine)  CHIEF COMPLAINT: Orencia infusion for rheumatoid arthritis  INTERVAL HISTORY: Patient presents to clinic today as routine six-month followup.  He continues to take Orencia every 4 weeks with good control of his rheumatoid arthritis. He recently had a CT scan of his abdomen to evaluate his intravascular stents and this noticed a suspicious pancreatic tail lesion.  He is anxious, but otherwise feels well.  He denies any recent fevers or illnesses.  He denies any chest pain or shortness of breath. He has a good appetite and denies weight loss. He has no nausea, vomiting, constipation, or diarrhea. He has no urinary complaints. Patient offers no further specific complaints today.   REVIEW OF SYSTEMS:   Review of Systems  Constitutional: Negative for fever and malaise/fatigue.  Respiratory: Negative.  Negative for cough and shortness of breath.   Cardiovascular: Negative.  Negative for chest pain and leg swelling.  Gastrointestinal: Negative.  Negative for abdominal pain, constipation, diarrhea, nausea and vomiting.  Genitourinary: Negative.   Musculoskeletal: Negative.  Negative for back pain, joint pain and neck pain.  Skin: Negative.  Negative for rash.  Neurological: Negative.  Negative for weakness.  Psychiatric/Behavioral: The patient is nervous/anxious.     As per HPI. Otherwise, a complete review of systems is negative.  PAST MEDICAL HISTORY: Past Medical History:  Diagnosis Date  . Aortic aneurysm ([place]) 05/2017  . Chronic back pain   . Diabetes mellitus without complication ([place])   . Dyslipidemia   . GERD (gastroesophageal reflux disease)   . H/O hiatal hernia   . Hypertension   . Melanoma ([place])    "back; left arm"  . PONV (postoperative nausea and  vomiting)   . Rheumatoid arthritis ([place])   . Spasm of esophagus     PAST SURGICAL HISTORY: Past Surgical History:  Procedure Laterality Date  . aneursym repair    . aneursym repair and stent placement  05/2017  . CARPAL TUNNEL RELEASE Bilateral   . COLON SURGERY  2007   removed 18" of colon  . ERCP W/ METAL STENT PLACEMENT  11/2005   Archie Endo 12/14/2005  . ESOPHAGOGASTRODUODENOSCOPY (EGD) WITH ESOPHAGEAL DILATION     "several times'  . HAND SURGERY Bilateral    "plastic knuckles"  . LAPAROSCOPIC CHOLECYSTECTOMY  01/2006  . MELANOMA EXCISION     "back; left arm"  . NASAL SEPTUM SURGERY    . PELVIC FRACTURE SURGERY  1964   "busted in 2 places"  . RESECTION DISTAL CLAVICAL Right 05/13/2016   Procedure: DISTAL CLAVICLE EXCISION;  Surgeon: Ninetta Lights, MD;  Location: [place];  Service: Orthopedics;  Laterality: Right;  . SHOULDER ARTHROSCOPY WITH ROTATOR CUFF REPAIR AND SUBACROMIAL DECOMPRESSION Right 05/13/2016   Procedure: RIGHT SHOULDER SCOPE DEBRIDEMENT, ACROMIOPLASTY, ROTATOR CUFF REPAIR; RELEASE BICEPS TENDON AND DEBRIDEMENT LABRUM;  Surgeon: Ninetta Lights, MD;  Location: [place];  Service: Orthopedics;  Laterality: Right;  . TIBIA FRACTURE SURGERY Left 2006   "put a steel rod in it"    FAMILY HISTORY Family History  Problem Relation Age of Onset  . Heart disease Mother   . Heart disease Father   . Cancer Maternal Grandmother        ADVANCED DIRECTIVES:    HEALTH MAINTENANCE: Social History   Tobacco Use  .  Smoking status: Former Smoker    Packs/day: 1.00    Years: 46.00    Pack years: 46.00    Types: Cigarettes    Last attempt to quit: 11/26/2005    Years since quitting: 12.1  . Smokeless tobacco: Never Used  Substance Use Topics  . Alcohol use: Yes    Alcohol/week: 5.4 oz    Types: 9 Shots of liquor per week    Comment: 05/23/2014 "3, 1 shot drinks maybe 3 times/wk"  . Drug use: No     Colonoscopy:  PAP:  Bone  density:  Lipid panel:  No Known Allergies  Current Outpatient Medications  Medication Sig Dispense Refill  . abatacept (ORENCIA) 250 MG injection Inject into the vein every 30 (thirty) days.    Marland Kitchen aspirin 325 MG tablet Take 325 mg by mouth daily.    Marland Kitchen atorvastatin (LIPITOR) 20 MG tablet Take 20 mg by mouth daily.    Marland Kitchen azelastine (ASTELIN) 137 MCG/SPRAY nasal spray Place 1 spray into the nose 2 (two) times daily. Use in each nostril as directed    . fluticasone (FLONASE) 50 MCG/ACT nasal spray Place 2 sprays into the nose daily.    . folic acid (FOLVITE) 1 MG tablet Take 1 mg by mouth daily.    . magnesium oxide (MAG-OX) 400 MG tablet Take 800 mg by mouth 3 (three) times daily.    . metFORMIN (GLUCOPHAGE) 500 MG tablet Take 500 mg by mouth daily with breakfast.    . methotrexate (RHEUMATREX) 2.5 MG tablet Take 2.5 mg by mouth once a week.    Marland Kitchen omeprazole (PRILOSEC) 20 MG capsule Take 20 mg by mouth daily.     Marland Kitchen oxyCODONE-acetaminophen (ROXICET) 5-325 MG tablet Take 1-2 tablets by mouth every 4 (four) hours as needed. 60 tablet 0  . potassium chloride (K-DUR) 10 MEQ tablet Take 10 mEq by mouth daily.     . predniSONE (DELTASONE) 5 MG tablet Take 5 mg by mouth daily with breakfast.    . tadalafil (CIALIS) 5 MG tablet Take 5 mg by mouth daily.     No current facility-administered medications for this visit.    Facility-Administered Medications Ordered in Other Visits  Medication Dose Route Frequency Provider Last Rate Last Dose  . 0.9 %  sodium chloride infusion   Intravenous Once Lloyd Huger, MD      . 0.9 %  sodium chloride infusion   Intravenous Once Lloyd Huger, MD      . abatacept [facility]) 1,000 mg in sodium chloride 0.9 % 100 mL IVPB  1,000 mg Intravenous Q28 days Lloyd Huger, MD      . abatacept [facility]) 1,000 mg in sodium chloride 0.9 % 100 mL IVPB  1,000 mg Intravenous Q28 days Lloyd Huger, MD      . abatacept [facility]) 1,000 mg in sodium chloride  0.9 % 100 mL IVPB  1,000 mg Intravenous Q28 days Lloyd Huger, MD   Stopped at 01/27/18 1012    OBJECTIVE: There were no vitals filed for this visit.   There is no height or weight on file to calculate BMI.    ECOG FS:0 - Asymptomatic  General: Well-developed, well-nourished, no acute distress. Eyes: Pink conjunctiva, anicteric sclera. Lungs: Clear to auscultation bilaterally. Heart: Regular rate and rhythm. No rubs, murmurs, or gallops. Abdomen: Soft, nontender, nondistended. No organomegaly noted, normoactive bowel sounds. Musculoskeletal: No edema, cyanosis, or clubbing. Neuro: Alert, answering all questions appropriately. Cranial nerves grossly intact. Skin:  No rashes or petechiae noted. Psych: Normal affect.   LAB RESULTS:  Lab Results  Component Value Date   NA 132 (L) 05/07/2016   K 3.8 05/07/2016   CL 96 (L) 05/07/2016   CO2 24 05/07/2016   GLUCOSE 131 (H) 05/07/2016   BUN 12 05/07/2016   CREATININE 0.98 05/07/2016   CALCIUM 9.3 05/07/2016   PROT 6.9 05/28/2015   ALBUMIN 3.7 05/28/2015   AST 18 05/28/2015   ALT 13 (L) 05/28/2015   ALKPHOS 72 05/28/2015   BILITOT 0.8 05/28/2015   GFRNONAA >60 05/07/2016   GFRAA >60 05/07/2016    Lab Results  Component Value Date   WBC 11.7 (H) 05/28/2015   NEUTROABS 10.1 (H) 05/28/2015   HGB 11.4 (L) 05/28/2015   HCT 33.5 (L) 05/28/2015   MCV 91.3 05/28/2015   PLT 173 05/28/2015     STUDIES: No results found.  ASSESSMENT: Orencia infusion for rheumatoid arthritis  PLAN:    1.  RA:  Continue with Orencia 1000 mg IV q 4 weeks per Dr. Francella Solian. Clair's orders.  Patient understands that if he has questions or concerns regarding his rheumatoid arthritis or Orencia he should defer them his primary rheumatologist.  All laboratory work will also be monitored by his rheumatologist.  Return to clinic every 28 days for infusions and then in 6 months for routine evaluation.  2. 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 Treatment per [facility]. 3. Vascular: Continue Plavix as prescribed. 4.  Pancreatic lesion: Patient has an MRI next week to further evaluate.  Approximately 30 minutes spent in discussion of which greater than 50% was consultation.   Patient expressed understanding and was in agreement with this plan. He also understands that He can call clinic at any time with any questions, concerns, or complaints.    Lloyd Huger, MD   01/27/2018 10:55 AM

## 2018-01-27 ENCOUNTER — Inpatient Hospital Stay: Payer: Medicare Other

## 2018-01-27 ENCOUNTER — Inpatient Hospital Stay: Payer: Medicare Other | Attending: Oncology | Admitting: Oncology

## 2018-01-27 VITALS — BP 138/75 | HR 66 | Temp 96.0°F | Resp 18

## 2018-01-27 DIAGNOSIS — Z85820 Personal history of malignant melanoma of skin: Secondary | ICD-10-CM | POA: Diagnosis not present

## 2018-01-27 DIAGNOSIS — G8929 Other chronic pain: Secondary | ICD-10-CM | POA: Diagnosis not present

## 2018-01-27 DIAGNOSIS — F419 Anxiety disorder, unspecified: Secondary | ICD-10-CM

## 2018-01-27 DIAGNOSIS — K219 Gastro-esophageal reflux disease without esophagitis: Secondary | ICD-10-CM

## 2018-01-27 DIAGNOSIS — Z7982 Long term (current) use of aspirin: Secondary | ICD-10-CM | POA: Diagnosis not present

## 2018-01-27 DIAGNOSIS — Z7984 Long term (current) use of oral hypoglycemic drugs: Secondary | ICD-10-CM | POA: Diagnosis not present

## 2018-01-27 DIAGNOSIS — I10 Essential (primary) hypertension: Secondary | ICD-10-CM | POA: Diagnosis not present

## 2018-01-27 DIAGNOSIS — M549 Dorsalgia, unspecified: Secondary | ICD-10-CM | POA: Diagnosis not present

## 2018-01-27 DIAGNOSIS — Z87891 Personal history of nicotine dependence: Secondary | ICD-10-CM

## 2018-01-27 DIAGNOSIS — Z7902 Long term (current) use of antithrombotics/antiplatelets: Secondary | ICD-10-CM | POA: Diagnosis not present

## 2018-01-27 DIAGNOSIS — M069 Rheumatoid arthritis, unspecified: Secondary | ICD-10-CM

## 2018-01-27 DIAGNOSIS — Z79899 Other long term (current) drug therapy: Secondary | ICD-10-CM | POA: Diagnosis not present

## 2018-01-27 DIAGNOSIS — K869 Disease of pancreas, unspecified: Secondary | ICD-10-CM | POA: Diagnosis not present

## 2018-01-27 DIAGNOSIS — E785 Hyperlipidemia, unspecified: Secondary | ICD-10-CM | POA: Diagnosis not present

## 2018-01-27 DIAGNOSIS — E119 Type 2 diabetes mellitus without complications: Secondary | ICD-10-CM | POA: Diagnosis not present

## 2018-01-27 MED ORDER — SODIUM CHLORIDE 0.9 % IV SOLN
1000.0000 mg | INTRAVENOUS | Status: DC
  Administered 2018-01-27: 1000 mg via INTRAVENOUS
  Filled 2018-01-27: qty 40

## 2018-01-27 MED ORDER — SODIUM CHLORIDE 0.9 % IV SOLN
Freq: Once | INTRAVENOUS | Status: AC
  Administered 2018-01-27: 09:00:00 via INTRAVENOUS
  Filled 2018-01-27: qty 1000

## 2018-01-27 NOTE — Progress Notes (Signed)
Patient seen by Dr. Grayland Ormond in infusion.

## 2018-01-27 NOTE — Patient Instructions (Signed)
Abatacept solution for injection (subcutaneous or intravenous use)  What is this medicine?  ABATACEPT (a ba TA sept) is used to treat moderate to severe active rheumatoid arthritis or psoriatic arthritis in adults. This medicine is also used to treat juvenile idiopathic arthritis.  This medicine may be used for other purposes; ask your health care provider or pharmacist if you have questions.  COMMON BRAND NAME(S): Orencia  What should I tell my health care provider before I take this medicine?  They need to know if you have any of these conditions:  -are taking other medicines to treat rheumatoid arthritis  -COPD  -diabetes  -infection or history of infections  -recently received or scheduled to receive a vaccine  -scheduled to have surgery  -tuberculosis, a positive skin test for tuberculosis or have recently been in close contact with someone who has tuberculosis  -viral hepatitis  -an unusual or allergic reaction to abatacept, other medicines, foods, dyes, or preservatives  -pregnant or trying to get pregnant  -breast-feeding  How should I use this medicine?  This medicine is for infusion into a vein or for injection under the skin. Infusions are given by a health care professional in a hospital or clinic setting. If you are to give your own medicine at home, you will be taught how to prepare and give this medicine under the skin. Use exactly as directed. Take your medicine at regular intervals. Do not take your medicine more often than directed.  It is important that you put your used needles and syringes in a special sharps container. Do not put them in a trash can. If you do not have a sharps container, call your pharmacist or healthcare provider to get one.  Talk to your pediatrician regarding the use of this medicine in children. While infusions in a clinic may be prescribed for children as young as 2 years for selected conditions, precautions do apply.  Overdosage: If you think you have taken too much of  this medicine contact a poison control center or emergency room at once.  NOTE: This medicine is only for you. Do not share this medicine with others.  What if I miss a dose?  This medicine is used once a week if given by injection under the skin. If you miss a dose, take it as soon as you can. If it is almost time for your next dose, take only that dose. Do not take double or extra doses.  If you are to be given an infusion, it is important not to miss your dose. Doses are usually every 4 weeks. Call your doctor or health care professional if you are unable to keep an appointment.  What may interact with this medicine?  Do not take this medicine with any of the following medications:  -adalimumab  -anakinra  -certolizumab  -etanercept  -golimumab  -infliximab  -live virus vaccines  -rituximab  -tocilizumab  This medicine may also interact with the following medications:  -vaccines  This list may not describe all possible interactions. Give your health care provider a list of all the medicines, herbs, non-prescription drugs, or dietary supplements you use. Also tell them if you smoke, drink alcohol, or use illegal drugs. Some items may interact with your medicine.  What should I watch for while using this medicine?  Visit your doctor for regular check ups while you are taking this medicine. Tell your doctor or healthcare professional if your symptoms do not start to get better or if they   get worse.  Call your doctor or health care professional if you get a cold or other infection while receiving this medicine. Do not treat yourself. This medicine may decrease your body's ability to fight infection. Try to avoid being around people who are sick.  What side effects may I notice from receiving this medicine?  Side effects that you should report to your doctor or health care professional as soon as possible:  -allergic reactions like skin rash, itching or hives, swelling of the face, lips, or tongue  -breathing  problems  -chest pain  -signs of infection - fever or chills, cough, unusual tiredness, pain or trouble passing urine, or warm, red or painful skin  Side effects that usually do not require medical attention (report to your doctor or health care professional if they continue or are bothersome):  -dizziness  -headache  -nausea, vomiting  -sore throat  -stomach upset  This list may not describe all possible side effects. Call your doctor for medical advice about side effects. You may report side effects to FDA at 1-800-FDA-1088.  Where should I keep my medicine?  Infusions will be given in a hospital or clinic and will not be stored at home.  Storage for syringes given under the skin and stored at home:  Keep out of the reach of children. Store in a refrigerator between 2 and 8 degrees C (36 and 46 degrees F). Keep this medicine in the original container. Protect from light. Do not freeze. Throw away any unused medicine after the expiration date.  NOTE: This sheet is a summary. It may not cover all possible information. If you have questions about this medicine, talk to your doctor, pharmacist, or health care provider.   2018 Elsevier/Gold Standard (2016-07-01 10:07:35)

## 2018-02-24 ENCOUNTER — Inpatient Hospital Stay: Payer: Medicare Other | Attending: Oncology

## 2018-02-24 VITALS — BP 123/66 | HR 60 | Temp 97.1°F | Resp 18

## 2018-02-24 DIAGNOSIS — Z79899 Other long term (current) drug therapy: Secondary | ICD-10-CM | POA: Diagnosis not present

## 2018-02-24 DIAGNOSIS — M069 Rheumatoid arthritis, unspecified: Secondary | ICD-10-CM

## 2018-02-24 MED ORDER — SODIUM CHLORIDE 0.9 % IV SOLN
Freq: Once | INTRAVENOUS | Status: AC
  Administered 2018-02-24: 09:00:00 via INTRAVENOUS
  Filled 2018-02-24: qty 1000

## 2018-02-24 MED ORDER — SODIUM CHLORIDE 0.9 % IV SOLN
1000.0000 mg | INTRAVENOUS | Status: DC
  Administered 2018-02-24: 1000 mg via INTRAVENOUS
  Filled 2018-02-24 (×2): qty 40

## 2018-02-24 NOTE — Patient Instructions (Signed)
Abatacept solution for injection (subcutaneous or intravenous use)  What is this medicine?  ABATACEPT (a ba TA sept) is used to treat moderate to severe active rheumatoid arthritis or psoriatic arthritis in adults. This medicine is also used to treat juvenile idiopathic arthritis.  This medicine may be used for other purposes; ask your health care provider or pharmacist if you have questions.  COMMON BRAND NAME(S): Orencia  What should I tell my health care provider before I take this medicine?  They need to know if you have any of these conditions:  -are taking other medicines to treat rheumatoid arthritis  -COPD  -diabetes  -infection or history of infections  -recently received or scheduled to receive a vaccine  -scheduled to have surgery  -tuberculosis, a positive skin test for tuberculosis or have recently been in close contact with someone who has tuberculosis  -viral hepatitis  -an unusual or allergic reaction to abatacept, other medicines, foods, dyes, or preservatives  -pregnant or trying to get pregnant  -breast-feeding  How should I use this medicine?  This medicine is for infusion into a vein or for injection under the skin. Infusions are given by a health care professional in a hospital or clinic setting. If you are to give your own medicine at home, you will be taught how to prepare and give this medicine under the skin. Use exactly as directed. Take your medicine at regular intervals. Do not take your medicine more often than directed.  It is important that you put your used needles and syringes in a special sharps container. Do not put them in a trash can. If you do not have a sharps container, call your pharmacist or healthcare provider to get one.  Talk to your pediatrician regarding the use of this medicine in children. While infusions in a clinic may be prescribed for children as young as 2 years for selected conditions, precautions do apply.  Overdosage: If you think you have taken too much of  this medicine contact a poison control center or emergency room at once.  NOTE: This medicine is only for you. Do not share this medicine with others.  What if I miss a dose?  This medicine is used once a week if given by injection under the skin. If you miss a dose, take it as soon as you can. If it is almost time for your next dose, take only that dose. Do not take double or extra doses.  If you are to be given an infusion, it is important not to miss your dose. Doses are usually every 4 weeks. Call your doctor or health care professional if you are unable to keep an appointment.  What may interact with this medicine?  Do not take this medicine with any of the following medications:  -adalimumab  -anakinra  -certolizumab  -etanercept  -golimumab  -infliximab  -live virus vaccines  -rituximab  -tocilizumab  This medicine may also interact with the following medications:  -vaccines  This list may not describe all possible interactions. Give your health care provider a list of all the medicines, herbs, non-prescription drugs, or dietary supplements you use. Also tell them if you smoke, drink alcohol, or use illegal drugs. Some items may interact with your medicine.  What should I watch for while using this medicine?  Visit your doctor for regular check ups while you are taking this medicine. Tell your doctor or healthcare professional if your symptoms do not start to get better or if they   get worse.  Call your doctor or health care professional if you get a cold or other infection while receiving this medicine. Do not treat yourself. This medicine may decrease your body's ability to fight infection. Try to avoid being around people who are sick.  What side effects may I notice from receiving this medicine?  Side effects that you should report to your doctor or health care professional as soon as possible:  -allergic reactions like skin rash, itching or hives, swelling of the face, lips, or tongue  -breathing  problems  -chest pain  -signs of infection - fever or chills, cough, unusual tiredness, pain or trouble passing urine, or warm, red or painful skin  Side effects that usually do not require medical attention (report to your doctor or health care professional if they continue or are bothersome):  -dizziness  -headache  -nausea, vomiting  -sore throat  -stomach upset  This list may not describe all possible side effects. Call your doctor for medical advice about side effects. You may report side effects to FDA at 1-800-FDA-1088.  Where should I keep my medicine?  Infusions will be given in a hospital or clinic and will not be stored at home.  Storage for syringes given under the skin and stored at home:  Keep out of the reach of children. Store in a refrigerator between 2 and 8 degrees C (36 and 46 degrees F). Keep this medicine in the original container. Protect from light. Do not freeze. Throw away any unused medicine after the expiration date.  NOTE: This sheet is a summary. It may not cover all possible information. If you have questions about this medicine, talk to your doctor, pharmacist, or health care provider.   2018 Elsevier/Gold Standard (2016-07-01 10:07:35)

## 2018-03-24 ENCOUNTER — Inpatient Hospital Stay: Payer: Medicare Other

## 2018-03-24 VITALS — BP 148/78 | HR 60 | Temp 96.6°F | Resp 18

## 2018-03-24 DIAGNOSIS — M069 Rheumatoid arthritis, unspecified: Secondary | ICD-10-CM | POA: Diagnosis not present

## 2018-03-24 MED ORDER — SODIUM CHLORIDE 0.9 % IV SOLN
1000.0000 mg | INTRAVENOUS | Status: DC
  Administered 2018-03-24: 1000 mg via INTRAVENOUS
  Filled 2018-03-24: qty 40

## 2018-03-24 MED ORDER — SODIUM CHLORIDE 0.9 % IV SOLN
Freq: Once | INTRAVENOUS | Status: AC
  Administered 2018-03-24: 09:00:00 via INTRAVENOUS
  Filled 2018-03-24: qty 1000

## 2018-03-24 NOTE — Patient Instructions (Signed)
Abatacept solution for injection (subcutaneous or intravenous use)  What is this medicine?  ABATACEPT (a ba TA sept) is used to treat moderate to severe active rheumatoid arthritis or psoriatic arthritis in adults. This medicine is also used to treat juvenile idiopathic arthritis.  This medicine may be used for other purposes; ask your health care provider or pharmacist if you have questions.  COMMON BRAND NAME(S): Orencia  What should I tell my health care provider before I take this medicine?  They need to know if you have any of these conditions:  -are taking other medicines to treat rheumatoid arthritis  -COPD  -diabetes  -infection or history of infections  -recently received or scheduled to receive a vaccine  -scheduled to have surgery  -tuberculosis, a positive skin test for tuberculosis or have recently been in close contact with someone who has tuberculosis  -viral hepatitis  -an unusual or allergic reaction to abatacept, other medicines, foods, dyes, or preservatives  -pregnant or trying to get pregnant  -breast-feeding  How should I use this medicine?  This medicine is for infusion into a vein or for injection under the skin. Infusions are given by a health care professional in a hospital or clinic setting. If you are to give your own medicine at home, you will be taught how to prepare and give this medicine under the skin. Use exactly as directed. Take your medicine at regular intervals. Do not take your medicine more often than directed.  It is important that you put your used needles and syringes in a special sharps container. Do not put them in a trash can. If you do not have a sharps container, call your pharmacist or healthcare provider to get one.  Talk to your pediatrician regarding the use of this medicine in children. While infusions in a clinic may be prescribed for children as young as 2 years for selected conditions, precautions do apply.  Overdosage: If you think you have taken too much of  this medicine contact a poison control center or emergency room at once.  NOTE: This medicine is only for you. Do not share this medicine with others.  What if I miss a dose?  This medicine is used once a week if given by injection under the skin. If you miss a dose, take it as soon as you can. If it is almost time for your next dose, take only that dose. Do not take double or extra doses.  If you are to be given an infusion, it is important not to miss your dose. Doses are usually every 4 weeks. Call your doctor or health care professional if you are unable to keep an appointment.  What may interact with this medicine?  Do not take this medicine with any of the following medications:  -adalimumab  -anakinra  -certolizumab  -etanercept  -golimumab  -infliximab  -live virus vaccines  -rituximab  -tocilizumab  This medicine may also interact with the following medications:  -vaccines  This list may not describe all possible interactions. Give your health care provider a list of all the medicines, herbs, non-prescription drugs, or dietary supplements you use. Also tell them if you smoke, drink alcohol, or use illegal drugs. Some items may interact with your medicine.  What should I watch for while using this medicine?  Visit your doctor for regular check ups while you are taking this medicine. Tell your doctor or healthcare professional if your symptoms do not start to get better or if they   get worse.  Call your doctor or health care professional if you get a cold or other infection while receiving this medicine. Do not treat yourself. This medicine may decrease your body's ability to fight infection. Try to avoid being around people who are sick.  What side effects may I notice from receiving this medicine?  Side effects that you should report to your doctor or health care professional as soon as possible:  -allergic reactions like skin rash, itching or hives, swelling of the face, lips, or tongue  -breathing  problems  -chest pain  -signs of infection - fever or chills, cough, unusual tiredness, pain or trouble passing urine, or warm, red or painful skin  Side effects that usually do not require medical attention (report to your doctor or health care professional if they continue or are bothersome):  -dizziness  -headache  -nausea, vomiting  -sore throat  -stomach upset  This list may not describe all possible side effects. Call your doctor for medical advice about side effects. You may report side effects to FDA at 1-800-FDA-1088.  Where should I keep my medicine?  Infusions will be given in a hospital or clinic and will not be stored at home.  Storage for syringes given under the skin and stored at home:  Keep out of the reach of children. Store in a refrigerator between 2 and 8 degrees C (36 and 46 degrees F). Keep this medicine in the original container. Protect from light. Do not freeze. Throw away any unused medicine after the expiration date.  NOTE: This sheet is a summary. It may not cover all possible information. If you have questions about this medicine, talk to your doctor, pharmacist, or health care provider.   2018 Elsevier/Gold Standard (2016-07-01 10:07:35)

## 2018-04-21 ENCOUNTER — Inpatient Hospital Stay: Payer: Medicare Other | Attending: Oncology

## 2018-04-21 VITALS — BP 125/70 | HR 64 | Temp 97.5°F | Resp 18

## 2018-04-21 DIAGNOSIS — M069 Rheumatoid arthritis, unspecified: Secondary | ICD-10-CM | POA: Insufficient documentation

## 2018-04-21 MED ORDER — SODIUM CHLORIDE 0.9 % IV SOLN
Freq: Once | INTRAVENOUS | Status: AC
  Administered 2018-04-21: 09:00:00 via INTRAVENOUS
  Filled 2018-04-21: qty 1000

## 2018-04-21 MED ORDER — ABATACEPT 250 MG IV SOLR
1000.0000 mg | INTRAVENOUS | Status: DC
  Administered 2018-04-21: 1000 mg via INTRAVENOUS
  Filled 2018-04-21 (×3): qty 40

## 2018-04-21 NOTE — Patient Instructions (Signed)
Nivolumab injection What is this medicine? NIVOLUMAB (nye VOL ue mab) is a monoclonal antibody. It is used to treat melanoma, lung cancer, kidney cancer, head and neck cancer, Hodgkin lymphoma, urothelial cancer, colon cancer, and liver cancer. This medicine may be used for other purposes; ask your health care provider or pharmacist if you have questions. COMMON BRAND NAME(S): Opdivo What should I tell my health care provider before I take this medicine? They need to know if you have any of these conditions: -diabetes -immune system problems -kidney disease -liver disease -lung disease -organ transplant -stomach or intestine problems -thyroid disease -an unusual or allergic reaction to nivolumab, other medicines, foods, dyes, or preservatives -pregnant or trying to get pregnant -breast-feeding How should I use this medicine? This medicine is for infusion into a vein. It is given by a health care professional in a hospital or clinic setting. A special MedGuide will be given to you before each treatment. Be sure to read this information carefully each time. Talk to your pediatrician regarding the use of this medicine in children. While this drug may be prescribed for children as young as 12 years for selected conditions, precautions do apply. Overdosage: If you think you have taken too much of this medicine contact a poison control center or emergency room at once. NOTE: This medicine is only for you. Do not share this medicine with others. What if I miss a dose? It is important not to miss your dose. Call your doctor or health care professional if you are unable to keep an appointment. What may interact with this medicine? Interactions have not been studied. Give your health care provider a list of all the medicines, herbs, non-prescription drugs, or dietary supplements you use. Also tell them if you smoke, drink alcohol, or use illegal drugs. Some items may interact with your  medicine. This list may not describe all possible interactions. Give your health care provider a list of all the medicines, herbs, non-prescription drugs, or dietary supplements you use. Also tell them if you smoke, drink alcohol, or use illegal drugs. Some items may interact with your medicine. What should I watch for while using this medicine? This drug may make you feel generally unwell. Continue your course of treatment even though you feel ill unless your doctor tells you to stop. You may need blood work done while you are taking this medicine. Do not become pregnant while taking this medicine or for 5 months after stopping it. Women should inform their doctor if they wish to become pregnant or think they might be pregnant. There is a potential for serious side effects to an unborn child. Talk to your health care professional or pharmacist for more information. Do not breast-feed an infant while taking this medicine. What side effects may I notice from receiving this medicine? Side effects that you should report to your doctor or health care professional as soon as possible: -allergic reactions like skin rash, itching or hives, swelling of the face, lips, or tongue -black, tarry stools -blood in the urine -bloody or watery diarrhea -changes in vision -change in sex drive -changes in emotions or moods -chest pain -confusion -cough -decreased appetite -diarrhea -facial flushing -feeling faint or lightheaded -fever, chills -hair loss -hallucination, loss of contact with reality -headache -irritable -joint pain -loss of memory -muscle pain -muscle weakness -seizures -shortness of breath -signs and symptoms of high blood sugar such as dizziness; dry mouth; dry skin; fruity breath; nausea; stomach pain; increased hunger or thirst; increased   urination -signs and symptoms of kidney injury like trouble passing urine or change in the amount of urine -signs and symptoms of liver injury  like dark yellow or brown urine; general ill feeling or flu-like symptoms; light-colored stools; loss of appetite; nausea; right upper belly pain; unusually weak or tired; yellowing of the eyes or skin -stiff neck -swelling of the ankles, feet, hands -weight gain Side effects that usually do not require medical attention (report to your doctor or health care professional if they continue or are bothersome): -bone pain -constipation -tiredness -vomiting This list may not describe all possible side effects. Call your doctor for medical advice about side effects. You may report side effects to FDA at 1-800-FDA-1088. Where should I keep my medicine? This drug is given in a hospital or clinic and will not be stored at home. NOTE: This sheet is a summary. It may not cover all possible information. If you have questions about this medicine, talk to your doctor, pharmacist, or health care provider.  2018 Elsevier/Gold Standard (2016-09-20 17:49:34)  

## 2018-05-19 ENCOUNTER — Inpatient Hospital Stay: Payer: Medicare Other | Attending: Oncology

## 2018-05-19 VITALS — BP 111/64 | HR 70 | Temp 97.8°F | Resp 19

## 2018-05-19 DIAGNOSIS — Z79899 Other long term (current) drug therapy: Secondary | ICD-10-CM | POA: Insufficient documentation

## 2018-05-19 DIAGNOSIS — M069 Rheumatoid arthritis, unspecified: Secondary | ICD-10-CM | POA: Diagnosis present

## 2018-05-19 MED ORDER — SODIUM CHLORIDE 0.9 % IV SOLN
Freq: Once | INTRAVENOUS | Status: AC
  Administered 2018-05-19: 09:00:00 via INTRAVENOUS
  Filled 2018-05-19: qty 1000

## 2018-05-19 MED ORDER — ABATACEPT 250 MG IV SOLR
1000.0000 mg | INTRAVENOUS | Status: DC
  Administered 2018-05-19: 1000 mg via INTRAVENOUS
  Filled 2018-05-19: qty 40

## 2018-06-16 ENCOUNTER — Inpatient Hospital Stay: Payer: Medicare Other | Attending: Oncology

## 2018-06-16 VITALS — BP 114/67 | HR 67 | Temp 97.3°F | Resp 18

## 2018-06-16 DIAGNOSIS — Z79899 Other long term (current) drug therapy: Secondary | ICD-10-CM | POA: Insufficient documentation

## 2018-06-16 DIAGNOSIS — M069 Rheumatoid arthritis, unspecified: Secondary | ICD-10-CM | POA: Diagnosis present

## 2018-06-16 MED ORDER — ABATACEPT 250 MG IV SOLR
1000.0000 mg | INTRAVENOUS | Status: DC
  Administered 2018-06-16: 1000 mg via INTRAVENOUS
  Filled 2018-06-16: qty 40

## 2018-06-16 MED ORDER — SODIUM CHLORIDE 0.9 % IV SOLN
Freq: Once | INTRAVENOUS | Status: AC
  Administered 2018-06-16: 09:00:00 via INTRAVENOUS
  Filled 2018-06-16: qty 1000

## 2018-07-14 ENCOUNTER — Inpatient Hospital Stay: Payer: Medicare Other | Attending: Oncology

## 2018-07-14 VITALS — BP 135/65 | HR 59 | Temp 97.0°F | Resp 18

## 2018-07-14 DIAGNOSIS — M069 Rheumatoid arthritis, unspecified: Secondary | ICD-10-CM | POA: Insufficient documentation

## 2018-07-14 MED ORDER — SODIUM CHLORIDE 0.9 % IV SOLN
Freq: Once | INTRAVENOUS | Status: AC
  Administered 2018-07-14: 09:00:00 via INTRAVENOUS
  Filled 2018-07-14: qty 1000

## 2018-07-14 MED ORDER — SODIUM CHLORIDE 0.9 % IV SOLN
1000.0000 mg | INTRAVENOUS | Status: DC
  Administered 2018-07-14: 1000 mg via INTRAVENOUS
  Filled 2018-07-14: qty 40

## 2018-07-14 NOTE — Patient Instructions (Signed)
Abatacept solution for injection (subcutaneous or intravenous use)  What is this medicine?  ABATACEPT (a ba TA sept) is used to treat moderate to severe active rheumatoid arthritis or psoriatic arthritis in adults. This medicine is also used to treat juvenile idiopathic arthritis.  This medicine may be used for other purposes; ask your health care provider or pharmacist if you have questions.  COMMON BRAND NAME(S): Orencia  What should I tell my health care provider before I take this medicine?  They need to know if you have any of these conditions:  -are taking other medicines to treat rheumatoid arthritis  -COPD  -diabetes  -infection or history of infections  -recently received or scheduled to receive a vaccine  -scheduled to have surgery  -tuberculosis, a positive skin test for tuberculosis or have recently been in close contact with someone who has tuberculosis  -viral hepatitis  -an unusual or allergic reaction to abatacept, other medicines, foods, dyes, or preservatives  -pregnant or trying to get pregnant  -breast-feeding  How should I use this medicine?  This medicine is for infusion into a vein or for injection under the skin. Infusions are given by a health care professional in a hospital or clinic setting. If you are to give your own medicine at home, you will be taught how to prepare and give this medicine under the skin. Use exactly as directed. Take your medicine at regular intervals. Do not take your medicine more often than directed.  It is important that you put your used needles and syringes in a special sharps container. Do not put them in a trash can. If you do not have a sharps container, call your pharmacist or healthcare provider to get one.  Talk to your pediatrician regarding the use of this medicine in children. While infusions in a clinic may be prescribed for children as young as 2 years for selected conditions, precautions do apply.  Overdosage: If you think you have taken too much of  this medicine contact a poison control center or emergency room at once.  NOTE: This medicine is only for you. Do not share this medicine with others.  What if I miss a dose?  This medicine is used once a week if given by injection under the skin. If you miss a dose, take it as soon as you can. If it is almost time for your next dose, take only that dose. Do not take double or extra doses.  If you are to be given an infusion, it is important not to miss your dose. Doses are usually every 4 weeks. Call your doctor or health care professional if you are unable to keep an appointment.  What may interact with this medicine?  Do not take this medicine with any of the following medications:  -adalimumab  -anakinra  -certolizumab  -etanercept  -golimumab  -infliximab  -live virus vaccines  -rituximab  -tocilizumab  This medicine may also interact with the following medications:  -vaccines  This list may not describe all possible interactions. Give your health care provider a list of all the medicines, herbs, non-prescription drugs, or dietary supplements you use. Also tell them if you smoke, drink alcohol, or use illegal drugs. Some items may interact with your medicine.  What should I watch for while using this medicine?  Visit your doctor for regular check ups while you are taking this medicine. Tell your doctor or healthcare professional if your symptoms do not start to get better or if they   get worse.  Call your doctor or health care professional if you get a cold or other infection while receiving this medicine. Do not treat yourself. This medicine may decrease your body's ability to fight infection. Try to avoid being around people who are sick.  What side effects may I notice from receiving this medicine?  Side effects that you should report to your doctor or health care professional as soon as possible:  -allergic reactions like skin rash, itching or hives, swelling of the face, lips, or tongue  -breathing  problems  -chest pain  -signs of infection - fever or chills, cough, unusual tiredness, pain or trouble passing urine, or warm, red or painful skin  Side effects that usually do not require medical attention (report to your doctor or health care professional if they continue or are bothersome):  -dizziness  -headache  -nausea, vomiting  -sore throat  -stomach upset  This list may not describe all possible side effects. Call your doctor for medical advice about side effects. You may report side effects to FDA at 1-800-FDA-1088.  Where should I keep my medicine?  Infusions will be given in a hospital or clinic and will not be stored at home.  Storage for syringes given under the skin and stored at home:  Keep out of the reach of children. Store in a refrigerator between 2 and 8 degrees C (36 and 46 degrees F). Keep this medicine in the original container. Protect from light. Do not freeze. Throw away any unused medicine after the expiration date.  NOTE: This sheet is a summary. It may not cover all possible information. If you have questions about this medicine, talk to your doctor, pharmacist, or health care provider.   2018 Elsevier/Gold Standard (2016-07-01 10:07:35)

## 2018-08-07 NOTE — Progress Notes (Signed)
[place]  Telephone:([phone]) [phone] [phone]) [phone]  ID: [name] OB: 1946-05-21  MR#: [id number]  WUX#:[id number]  Patient Care Team: Elaina Pattee, MD as PCP - General (Family Medicine)  CHIEF COMPLAINT: Orencia infusion for rheumatoid arthritis  INTERVAL HISTORY: Patient returns to clinic today for further evaluation and routine six-month follow-up.  He continues to tolerate Orencia without significant side effects.  He has noted no progression of his rheumatoid arthritis.  He currently feels well and is asymptomatic.  He denies any recent fevers or illnesses.  He has no neurologic complaints.  He denies any chest pain or shortness of breath. He has a good appetite and denies weight loss. He has no nausea, vomiting, constipation, or diarrhea. He has no urinary complaints.  Patient feels at his baseline and offers no specific complaints today.  REVIEW OF SYSTEMS:   Review of Systems  Constitutional: Negative.  Negative for fever, malaise/fatigue and weight loss.  Respiratory: Negative.  Negative for cough and shortness of breath.   Cardiovascular: Negative.  Negative for chest pain and leg swelling.  Gastrointestinal: Negative.  Negative for abdominal pain, constipation, diarrhea, nausea and vomiting.  Genitourinary: Negative.  Negative for dysuria.  Musculoskeletal: Positive for joint pain. Negative for back pain and neck pain.  Skin: Negative.  Negative for rash.  Neurological: Negative.  Negative for sensory change, focal weakness and weakness.  Psychiatric/Behavioral: Negative.  The patient is not nervous/anxious.     As per HPI. Otherwise, a complete review of systems is negative.  PAST MEDICAL HISTORY: Past Medical History:  Diagnosis Date  . Aortic aneurysm ([place]) 05/2017  . Chronic back pain   . Diabetes mellitus without complication ([place])   . Dyslipidemia   . GERD (gastroesophageal reflux disease)   . H/O hiatal hernia   .  Hypertension   . Melanoma ([place])    "back; left arm"  . PONV (postoperative nausea and vomiting)   . Rheumatoid arthritis ([place])   . Spasm of esophagus     PAST SURGICAL HISTORY: Past Surgical History:  Procedure Laterality Date  . aneursym repair    . aneursym repair and stent placement  05/2017  . CARPAL TUNNEL RELEASE Bilateral   . COLON SURGERY  2007   removed 18" of colon  . ERCP W/ METAL STENT PLACEMENT  11/2005   Archie Endo 12/14/2005  . ESOPHAGOGASTRODUODENOSCOPY (EGD) WITH ESOPHAGEAL DILATION     "several times'  . HAND SURGERY Bilateral    "plastic knuckles"  . LAPAROSCOPIC CHOLECYSTECTOMY  01/2006  . MELANOMA EXCISION     "back; left arm"  . NASAL SEPTUM SURGERY    . PELVIC FRACTURE SURGERY  1964   "busted in 2 places"  . RESECTION DISTAL CLAVICAL Right 05/13/2016   Procedure: DISTAL CLAVICLE EXCISION;  Surgeon: Ninetta Lights, MD;  Location: [place];  Service: Orthopedics;  Laterality: Right;  . SHOULDER ARTHROSCOPY WITH ROTATOR CUFF REPAIR AND SUBACROMIAL DECOMPRESSION Right 05/13/2016   Procedure: RIGHT SHOULDER SCOPE DEBRIDEMENT, ACROMIOPLASTY, ROTATOR CUFF REPAIR; RELEASE BICEPS TENDON AND DEBRIDEMENT LABRUM;  Surgeon: Ninetta Lights, MD;  Location: [place];  Service: Orthopedics;  Laterality: Right;  . TIBIA FRACTURE SURGERY Left 2006   "put a steel rod in it"    FAMILY HISTORY Family History  Problem Relation Age of Onset  . Heart disease Mother   . Heart disease Father   . Cancer Maternal Grandmother        ADVANCED DIRECTIVES:  HEALTH MAINTENANCE: Social History   Tobacco Use  . Smoking status: Former Smoker    Packs/day: 1.00    Years: 46.00    Pack years: 46.00    Types: Cigarettes    Last attempt to quit: 11/26/2005    Years since quitting: 12.7  . Smokeless tobacco: Never Used  Substance Use Topics  . Alcohol use: Yes    Alcohol/week: 9.0 standard drinks    Types: 9 Shots of liquor per week     Comment: 05/23/2014 "3, 1 shot drinks maybe 3 times/wk"  . Drug use: No     Colonoscopy:  PAP:  Bone density:  Lipid panel:  No Known Allergies  Current Outpatient Medications  Medication Sig Dispense Refill  . abatacept (ORENCIA) 250 MG injection Inject into the vein every 30 (thirty) days.    Marland Kitchen aspirin 325 MG tablet Take 325 mg by mouth daily.    Marland Kitchen atorvastatin (LIPITOR) 20 MG tablet Take 20 mg by mouth daily.    Marland Kitchen azelastine (ASTELIN) 137 MCG/SPRAY nasal spray Place 1 spray into the nose 2 (two) times daily. Use in each nostril as directed    . fluticasone (FLONASE) 50 MCG/ACT nasal spray Place 2 sprays into the nose daily.    . folic acid (FOLVITE) 1 MG tablet Take 1 mg by mouth daily.    . magnesium oxide (MAG-OX) 400 MG tablet Take 800 mg by mouth 3 (three) times daily.    . metFORMIN (GLUCOPHAGE) 500 MG tablet Take 500 mg by mouth daily with breakfast.    . methotrexate (RHEUMATREX) 2.5 MG tablet Take 2.5 mg by mouth once a week.    Marland Kitchen omeprazole (PRILOSEC) 20 MG capsule Take 20 mg by mouth daily.     Marland Kitchen oxyCODONE-acetaminophen (ROXICET) 5-325 MG tablet Take 1-2 tablets by mouth every 4 (four) hours as needed. 60 tablet 0  . potassium chloride (K-DUR) 10 MEQ tablet Take 10 mEq by mouth daily.     . predniSONE (DELTASONE) 5 MG tablet Take 5 mg by mouth daily with breakfast.    . tadalafil (CIALIS) 5 MG tablet Take 5 mg by mouth daily.     Current Facility-Administered Medications  Medication Dose Route Frequency Provider Last Rate Last Dose  . 0.9 %  sodium chloride infusion   Intravenous Once Lloyd Huger, MD      . abatacept [facility]) 1,000 mg in sodium chloride 0.9 % 100 mL IVPB  1,000 mg Intravenous Q28 days Lloyd Huger, MD 200 mL/hr at 08/11/18 0939 1,000 mg at 08/11/18 1660   Facility-Administered Medications Ordered in Other Visits  Medication Dose Route Frequency Provider Last Rate Last Dose  . 0.9 %  sodium chloride infusion   Intravenous Once Lloyd Huger, MD      . 0.9 %  sodium chloride infusion   Intravenous Once Lloyd Huger, MD      . abatacept [facility]) 1,000 mg in sodium chloride 0.9 % 100 mL IVPB  1,000 mg Intravenous Q28 days Lloyd Huger, MD      . abatacept [facility]) 1,000 mg in sodium chloride 0.9 % 100 mL IVPB  1,000 mg Intravenous Q28 days Lloyd Huger, MD        OBJECTIVE: Vitals:   08/11/18 0900  BP: (!) 165/61  Pulse: 65  Resp: 18  Temp: (!) 97.1 F (36.2 C)     There is no height or weight on file to calculate BMI.    ECOG FS:0 -  Asymptomatic  General: Well-developed, well-nourished, no acute distress. Eyes: Pink conjunctiva, anicteric sclera. HEENT: Normocephalic, moist mucous membranes. Lungs: Clear to auscultation bilaterally. Heart: Regular rate and rhythm. No rubs, murmurs, or gallops. Abdomen: Soft, nontender, nondistended. No organomegaly noted, normoactive bowel sounds. Musculoskeletal: No edema, cyanosis, or clubbing.  Joint deformity in hands secondary to rheumatoid arthritis. Neuro: Alert, answering all questions appropriately. Cranial nerves grossly intact. Skin: No rashes or petechiae noted. Psych: Normal affect.   LAB RESULTS:  Lab Results  Component Value Date   NA 132 (L) 05/07/2016   K 3.8 05/07/2016   CL 96 (L) 05/07/2016   CO2 24 05/07/2016   GLUCOSE 131 (H) 05/07/2016   BUN 12 05/07/2016   CREATININE 0.98 05/07/2016   CALCIUM 9.3 05/07/2016   PROT 6.9 05/28/2015   ALBUMIN 3.7 05/28/2015   AST 18 05/28/2015   ALT 13 (L) 05/28/2015   ALKPHOS 72 05/28/2015   BILITOT 0.8 05/28/2015   GFRNONAA >60 05/07/2016   GFRAA >60 05/07/2016    Lab Results  Component Value Date   WBC 11.7 (H) 05/28/2015   NEUTROABS 10.1 (H) 05/28/2015   HGB 11.4 (L) 05/28/2015   HCT 33.5 (L) 05/28/2015   MCV 91.3 05/28/2015   PLT 173 05/28/2015     STUDIES: No results found.  ASSESSMENT: Orencia infusion for rheumatoid arthritis  PLAN:    1.  RA: Continue 1000 mg  IV Orencia every 4 weeks per Dr. Francella Solian. Clair's orders.  Patient expressed understanding that any questions or concerns regarding his infusion and all laboratory work will be monitored his primary rheumatologist.  Return to clinic every 4 weeks for treatment and then in 6 months for routine evaluation.   2. 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 Treatment per [facility]. 3. Vascular: Continue Plavix as prescribed. 4.  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  I spent a total of 30 minutes face-to-face with the patient of which greater than 50% of the visit was spent in counseling and coordination of care as detailed above.    Patient expressed understanding and was in agreement with this plan. He also understands that He can call clinic at any time with any questions, concerns, or complaints.    Lloyd Huger, MD   08/11/2018 10:23 AM

## 2018-08-11 ENCOUNTER — Inpatient Hospital Stay: Payer: Medicare Other | Attending: Oncology | Admitting: Oncology

## 2018-08-11 ENCOUNTER — Inpatient Hospital Stay: Payer: Medicare Other

## 2018-08-11 VITALS — BP 165/61 | HR 65 | Temp 97.1°F | Resp 18

## 2018-08-11 DIAGNOSIS — E119 Type 2 diabetes mellitus without complications: Secondary | ICD-10-CM

## 2018-08-11 DIAGNOSIS — K219 Gastro-esophageal reflux disease without esophagitis: Secondary | ICD-10-CM | POA: Diagnosis not present

## 2018-08-11 DIAGNOSIS — Z79899 Other long term (current) drug therapy: Secondary | ICD-10-CM | POA: Diagnosis not present

## 2018-08-11 DIAGNOSIS — Z85820 Personal history of malignant melanoma of skin: Secondary | ICD-10-CM | POA: Diagnosis not present

## 2018-08-11 DIAGNOSIS — E785 Hyperlipidemia, unspecified: Secondary | ICD-10-CM

## 2018-08-11 DIAGNOSIS — Z87891 Personal history of nicotine dependence: Secondary | ICD-10-CM

## 2018-08-11 DIAGNOSIS — Z7984 Long term (current) use of oral hypoglycemic drugs: Secondary | ICD-10-CM | POA: Diagnosis not present

## 2018-08-11 DIAGNOSIS — M069 Rheumatoid arthritis, unspecified: Secondary | ICD-10-CM

## 2018-08-11 DIAGNOSIS — Z7952 Long term (current) use of systemic steroids: Secondary | ICD-10-CM | POA: Diagnosis not present

## 2018-08-11 DIAGNOSIS — Z7902 Long term (current) use of antithrombotics/antiplatelets: Secondary | ICD-10-CM

## 2018-08-11 DIAGNOSIS — Z7982 Long term (current) use of aspirin: Secondary | ICD-10-CM | POA: Diagnosis not present

## 2018-08-11 DIAGNOSIS — I10 Essential (primary) hypertension: Secondary | ICD-10-CM | POA: Diagnosis not present

## 2018-08-11 MED ORDER — SODIUM CHLORIDE 0.9 % IV SOLN
Freq: Once | INTRAVENOUS | Status: AC
  Administered 2018-08-11: 09:00:00 via INTRAVENOUS
  Filled 2018-08-11: qty 1000

## 2018-08-11 MED ORDER — SODIUM CHLORIDE 0.9 % IV SOLN
1000.0000 mg | INTRAVENOUS | Status: DC
  Administered 2018-08-11: 1000 mg via INTRAVENOUS
  Filled 2018-08-11: qty 40

## 2018-09-08 ENCOUNTER — Inpatient Hospital Stay: Payer: Medicare Other | Attending: Oncology

## 2018-09-08 VITALS — BP 148/84 | HR 58 | Temp 98.6°F | Resp 189

## 2018-09-08 DIAGNOSIS — M069 Rheumatoid arthritis, unspecified: Secondary | ICD-10-CM | POA: Diagnosis not present

## 2018-09-08 MED ORDER — SODIUM CHLORIDE 0.9 % IV SOLN
1000.0000 mg | INTRAVENOUS | Status: DC
  Administered 2018-09-08: 1000 mg via INTRAVENOUS
  Filled 2018-09-08: qty 40

## 2018-09-08 MED ORDER — SODIUM CHLORIDE 0.9 % IV SOLN
Freq: Once | INTRAVENOUS | Status: AC
  Administered 2018-09-08: 09:00:00 via INTRAVENOUS
  Filled 2018-09-08: qty 250

## 2018-09-08 NOTE — Patient Instructions (Signed)
Abatacept solution for injection (subcutaneous or intravenous use)  What is this medicine?  ABATACEPT (a ba TA sept) is used to treat moderate to severe active rheumatoid arthritis or psoriatic arthritis in adults. This medicine is also used to treat juvenile idiopathic arthritis.  This medicine may be used for other purposes; ask your health care provider or pharmacist if you have questions.  COMMON BRAND NAME(S): Orencia  What should I tell my health care provider before I take this medicine?  They need to know if you have any of these conditions:  -are taking other medicines to treat rheumatoid arthritis  -COPD  -diabetes  -infection or history of infections  -recently received or scheduled to receive a vaccine  -scheduled to have surgery  -tuberculosis, a positive skin test for tuberculosis or have recently been in close contact with someone who has tuberculosis  -viral hepatitis  -an unusual or allergic reaction to abatacept, other medicines, foods, dyes, or preservatives  -pregnant or trying to get pregnant  -breast-feeding  How should I use this medicine?  This medicine is for infusion into a vein or for injection under the skin. Infusions are given by a health care professional in a hospital or clinic setting. If you are to give your own medicine at home, you will be taught how to prepare and give this medicine under the skin. Use exactly as directed. Take your medicine at regular intervals. Do not take your medicine more often than directed.  It is important that you put your used needles and syringes in a special sharps container. Do not put them in a trash can. If you do not have a sharps container, call your pharmacist or healthcare provider to get one.  Talk to your pediatrician regarding the use of this medicine in children. While infusions in a clinic may be prescribed for children as young as 2 years for selected conditions, precautions do apply.  Overdosage: If you think you have taken too much of  this medicine contact a poison control center or emergency room at once.  NOTE: This medicine is only for you. Do not share this medicine with others.  What if I miss a dose?  This medicine is used once a week if given by injection under the skin. If you miss a dose, take it as soon as you can. If it is almost time for your next dose, take only that dose. Do not take double or extra doses.  If you are to be given an infusion, it is important not to miss your dose. Doses are usually every 4 weeks. Call your doctor or health care professional if you are unable to keep an appointment.  What may interact with this medicine?  Do not take this medicine with any of the following medications:  -adalimumab  -anakinra  -certolizumab  -etanercept  -golimumab  -infliximab  -live virus vaccines  -rituximab  -tocilizumab  This medicine may also interact with the following medications:  -vaccines  This list may not describe all possible interactions. Give your health care provider a list of all the medicines, herbs, non-prescription drugs, or dietary supplements you use. Also tell them if you smoke, drink alcohol, or use illegal drugs. Some items may interact with your medicine.  What should I watch for while using this medicine?  Visit your doctor for regular check ups while you are taking this medicine. Tell your doctor or healthcare professional if your symptoms do not start to get better or if they   get worse.  Call your doctor or health care professional if you get a cold or other infection while receiving this medicine. Do not treat yourself. This medicine may decrease your body's ability to fight infection. Try to avoid being around people who are sick.  What side effects may I notice from receiving this medicine?  Side effects that you should report to your doctor or health care professional as soon as possible:  -allergic reactions like skin rash, itching or hives, swelling of the face, lips, or tongue  -breathing  problems  -chest pain  -signs of infection - fever or chills, cough, unusual tiredness, pain or trouble passing urine, or warm, red or painful skin  Side effects that usually do not require medical attention (report to your doctor or health care professional if they continue or are bothersome):  -dizziness  -headache  -nausea, vomiting  -sore throat  -stomach upset  This list may not describe all possible side effects. Call your doctor for medical advice about side effects. You may report side effects to FDA at 1-800-FDA-1088.  Where should I keep my medicine?  Infusions will be given in a hospital or clinic and will not be stored at home.  Storage for syringes given under the skin and stored at home:  Keep out of the reach of children. Store in a refrigerator between 2 and 8 degrees C (36 and 46 degrees F). Keep this medicine in the original container. Protect from light. Do not freeze. Throw away any unused medicine after the expiration date.  NOTE: This sheet is a summary. It may not cover all possible information. If you have questions about this medicine, talk to your doctor, pharmacist, or health care provider.   2018 Elsevier/Gold Standard (2016-07-01 10:07:35)

## 2018-10-06 ENCOUNTER — Inpatient Hospital Stay: Payer: Medicare Other | Attending: Oncology

## 2018-10-06 VITALS — BP 113/70 | HR 55 | Temp 97.5°F | Resp 18

## 2018-10-06 DIAGNOSIS — M069 Rheumatoid arthritis, unspecified: Secondary | ICD-10-CM | POA: Diagnosis present

## 2018-10-06 MED ORDER — SODIUM CHLORIDE 0.9 % IV SOLN
Freq: Once | INTRAVENOUS | Status: AC
  Administered 2018-10-06: 09:00:00 via INTRAVENOUS
  Filled 2018-10-06: qty 250

## 2018-10-06 MED ORDER — SODIUM CHLORIDE 0.9 % IV SOLN
1000.0000 mg | INTRAVENOUS | Status: DC
  Administered 2018-10-06: 1000 mg via INTRAVENOUS
  Filled 2018-10-06: qty 40

## 2018-11-03 ENCOUNTER — Inpatient Hospital Stay: Payer: Medicare Other | Attending: Oncology

## 2018-11-03 VITALS — BP 128/60 | HR 66 | Temp 97.6°F | Resp 18

## 2018-11-03 DIAGNOSIS — M069 Rheumatoid arthritis, unspecified: Secondary | ICD-10-CM | POA: Insufficient documentation

## 2018-11-03 MED ORDER — SODIUM CHLORIDE 0.9 % IV SOLN
1000.0000 mg | INTRAVENOUS | Status: DC
  Administered 2018-11-03: 1000 mg via INTRAVENOUS
  Filled 2018-11-03: qty 40

## 2018-11-03 MED ORDER — SODIUM CHLORIDE 0.9 % IV SOLN
Freq: Once | INTRAVENOUS | Status: AC
  Administered 2018-11-03: 11:00:00 via INTRAVENOUS
  Filled 2018-11-03: qty 250

## 2018-11-03 NOTE — Patient Instructions (Signed)
Abatacept solution for injection (subcutaneous or intravenous use)  What is this medicine?  ABATACEPT (a ba TA sept) is used to treat moderate to severe active rheumatoid arthritis or psoriatic arthritis in adults. This medicine is also used to treat juvenile idiopathic arthritis.  This medicine may be used for other purposes; ask your health care provider or pharmacist if you have questions.  COMMON BRAND NAME(S): Orencia  What should I tell my health care provider before I take this medicine?  They need to know if you have any of these conditions:  -are taking other medicines to treat rheumatoid arthritis  -COPD  -diabetes  -infection or history of infections  -recently received or scheduled to receive a vaccine  -scheduled to have surgery  -tuberculosis, a positive skin test for tuberculosis or have recently been in close contact with someone who has tuberculosis  -viral hepatitis  -an unusual or allergic reaction to abatacept, other medicines, foods, dyes, or preservatives  -pregnant or trying to get pregnant  -breast-feeding  How should I use this medicine?  This medicine is for infusion into a vein or for injection under the skin. Infusions are given by a health care professional in a hospital or clinic setting. If you are to give your own medicine at home, you will be taught how to prepare and give this medicine under the skin. Use exactly as directed. Take your medicine at regular intervals. Do not take your medicine more often than directed.  It is important that you put your used needles and syringes in a special sharps container. Do not put them in a trash can. If you do not have a sharps container, call your pharmacist or healthcare provider to get one.  Talk to your pediatrician regarding the use of this medicine in children. While infusions in a clinic may be prescribed for children as young as 2 years for selected conditions, precautions do apply.  Overdosage: If you think you have taken too much of  this medicine contact a poison control center or emergency room at once.  NOTE: This medicine is only for you. Do not share this medicine with others.  What if I miss a dose?  This medicine is used once a week if given by injection under the skin. If you miss a dose, take it as soon as you can. If it is almost time for your next dose, take only that dose. Do not take double or extra doses.  If you are to be given an infusion, it is important not to miss your dose. Doses are usually every 4 weeks. Call your doctor or health care professional if you are unable to keep an appointment.  What may interact with this medicine?  Do not take this medicine with any of the following medications:  -adalimumab  -anakinra  -certolizumab  -etanercept  -golimumab  -infliximab  -live virus vaccines  -rituximab  -tocilizumab  This medicine may also interact with the following medications:  -vaccines  This list may not describe all possible interactions. Give your health care provider a list of all the medicines, herbs, non-prescription drugs, or dietary supplements you use. Also tell them if you smoke, drink alcohol, or use illegal drugs. Some items may interact with your medicine.  What should I watch for while using this medicine?  Visit your doctor for regular check ups while you are taking this medicine. Tell your doctor or healthcare professional if your symptoms do not start to get better or if they   get worse.  Call your doctor or health care professional if you get a cold or other infection while receiving this medicine. Do not treat yourself. This medicine may decrease your body's ability to fight infection. Try to avoid being around people who are sick.  What side effects may I notice from receiving this medicine?  Side effects that you should report to your doctor or health care professional as soon as possible:  -allergic reactions like skin rash, itching or hives, swelling of the face, lips, or tongue  -breathing  problems  -chest pain  -signs of infection - fever or chills, cough, unusual tiredness, pain or trouble passing urine, or warm, red or painful skin  Side effects that usually do not require medical attention (report to your doctor or health care professional if they continue or are bothersome):  -dizziness  -headache  -nausea, vomiting  -sore throat  -stomach upset  This list may not describe all possible side effects. Call your doctor for medical advice about side effects. You may report side effects to FDA at 1-800-FDA-1088.  Where should I keep my medicine?  Infusions will be given in a hospital or clinic and will not be stored at home.  Storage for syringes given under the skin and stored at home:  Keep out of the reach of children. Store in a refrigerator between 2 and 8 degrees C (36 and 46 degrees F). Keep this medicine in the original container. Protect from light. Do not freeze. Throw away any unused medicine after the expiration date.  NOTE: This sheet is a summary. It may not cover all possible information. If you have questions about this medicine, talk to your doctor, pharmacist, or health care provider.   2018 Elsevier/Gold Standard (2016-07-01 10:07:35)

## 2018-12-01 ENCOUNTER — Inpatient Hospital Stay: Payer: Medicare Other | Attending: Oncology

## 2018-12-01 VITALS — BP 127/69 | HR 69 | Temp 95.8°F | Resp 18

## 2018-12-01 DIAGNOSIS — Z79899 Other long term (current) drug therapy: Secondary | ICD-10-CM | POA: Diagnosis not present

## 2018-12-01 DIAGNOSIS — M069 Rheumatoid arthritis, unspecified: Secondary | ICD-10-CM | POA: Diagnosis present

## 2018-12-01 MED ORDER — SODIUM CHLORIDE 0.9 % IV SOLN
1000.0000 mg | INTRAVENOUS | Status: DC
  Administered 2018-12-01: 1000 mg via INTRAVENOUS
  Filled 2018-12-01: qty 40

## 2018-12-01 MED ORDER — SODIUM CHLORIDE 0.9 % IV SOLN
Freq: Once | INTRAVENOUS | Status: AC
  Administered 2018-12-01: 09:00:00 via INTRAVENOUS
  Filled 2018-12-01: qty 250

## 2018-12-29 ENCOUNTER — Inpatient Hospital Stay: Payer: Medicare Other

## 2018-12-29 ENCOUNTER — Inpatient Hospital Stay: Payer: Medicare Other | Attending: Hematology and Oncology

## 2018-12-29 VITALS — BP 114/66 | HR 65 | Temp 97.7°F | Resp 18

## 2018-12-29 DIAGNOSIS — Z79899 Other long term (current) drug therapy: Secondary | ICD-10-CM | POA: Diagnosis not present

## 2018-12-29 DIAGNOSIS — M069 Rheumatoid arthritis, unspecified: Secondary | ICD-10-CM | POA: Diagnosis present

## 2018-12-29 MED ORDER — SODIUM CHLORIDE 0.9 % IV SOLN
Freq: Once | INTRAVENOUS | Status: AC
  Administered 2018-12-29: 09:00:00 via INTRAVENOUS
  Filled 2018-12-29: qty 250

## 2018-12-29 MED ORDER — SODIUM CHLORIDE 0.9 % IV SOLN
1000.0000 mg | INTRAVENOUS | Status: DC
  Administered 2018-12-29: 1000 mg via INTRAVENOUS
  Filled 2018-12-29: qty 40

## 2019-01-26 ENCOUNTER — Inpatient Hospital Stay: Payer: Medicare Other

## 2019-01-26 VITALS — BP 118/62 | HR 57 | Temp 96.5°F | Resp 16

## 2019-01-26 DIAGNOSIS — M069 Rheumatoid arthritis, unspecified: Secondary | ICD-10-CM | POA: Diagnosis not present

## 2019-01-26 MED ORDER — SODIUM CHLORIDE 0.9 % IV SOLN
1000.0000 mg | INTRAVENOUS | Status: DC
  Administered 2019-01-26: 1000 mg via INTRAVENOUS
  Filled 2019-01-26: qty 40

## 2019-01-26 MED ORDER — SODIUM CHLORIDE 0.9 % IV SOLN
Freq: Once | INTRAVENOUS | Status: AC
  Administered 2019-01-26: 12:00:00 via INTRAVENOUS
  Filled 2019-01-26: qty 250

## 2019-01-26 NOTE — Patient Instructions (Signed)
Abatacept solution for injection (subcutaneous or intravenous use) What is this medicine? ABATACEPT (a ba TA sept) is used to treat moderate to severe active rheumatoid arthritis or psoriatic arthritis in adults. This medicine is also used to treat juvenile idiopathic arthritis. This medicine may be used for other purposes; ask your health care provider or pharmacist if you have questions. COMMON BRAND NAME(S): Orencia What should I tell my health care provider before I take this medicine? They need to know if you have any of these conditions: -are taking other medicines to treat rheumatoid arthritis -COPD -diabetes -infection or history of infections -recently received or scheduled to receive a vaccine -scheduled to have surgery -tuberculosis, a positive skin test for tuberculosis or have recently been in close contact with someone who has tuberculosis -viral hepatitis -an unusual or allergic reaction to abatacept, other medicines, foods, dyes, or preservatives -pregnant or trying to get pregnant -breast-feeding How should I use this medicine? This medicine is for infusion into a vein or for injection under the skin. Infusions are given by a health care professional in a hospital or clinic setting. If you are to give your own medicine at home, you will be taught how to prepare and give this medicine under the skin. Use exactly as directed. Take your medicine at regular intervals. Do not take your medicine more often than directed. It is important that you put your used needles and syringes in a special sharps container. Do not put them in a trash can. If you do not have a sharps container, call your pharmacist or healthcare provider to get one. Talk to your pediatrician regarding the use of this medicine in children. While infusions in a clinic may be prescribed for children as young as 2 years for selected conditions, precautions do apply. Overdosage: If you think you have taken too much of  this medicine contact a poison control center or emergency room at once. NOTE: This medicine is only for you. Do not share this medicine with others. What if I miss a dose? This medicine is used once a week if given by injection under the skin. If you miss a dose, take it as soon as you can. If it is almost time for your next dose, take only that dose. Do not take double or extra doses. If you are to be given an infusion, it is important not to miss your dose. Doses are usually every 4 weeks. Call your doctor or health care professional if you are unable to keep an appointment. What may interact with this medicine? Do not take this medicine with any of the following medications: -adalimumab -anakinra -certolizumab -etanercept -golimumab -infliximab -live virus vaccines -rituximab -tocilizumab This medicine may also interact with the following medications: -vaccines This list may not describe all possible interactions. Give your health care provider a list of all the medicines, herbs, non-prescription drugs, or dietary supplements you use. Also tell them if you smoke, drink alcohol, or use illegal drugs. Some items may interact with your medicine. What should I watch for while using this medicine? Visit your doctor for regular check ups while you are taking this medicine. Tell your doctor or healthcare professional if your symptoms do not start to get better or if they get worse. Call your doctor or health care professional if you get a cold or other infection while receiving this medicine. Do not treat yourself. This medicine may decrease your body's ability to fight infection. Try to avoid being around people who   are sick. What side effects may I notice from receiving this medicine? Side effects that you should report to your doctor or health care professional as soon as possible: -allergic reactions like skin rash, itching or hives, swelling of the face, lips, or tongue -breathing  problems -chest pain -signs of infection - fever or chills, cough, unusual tiredness, pain or trouble passing urine, or warm, red or painful skin Side effects that usually do not require medical attention (report to your doctor or health care professional if they continue or are bothersome): -dizziness -headache -nausea, vomiting -sore throat -stomach upset This list may not describe all possible side effects. Call your doctor for medical advice about side effects. You may report side effects to FDA at 1-800-FDA-1088. Where should I keep my medicine? Infusions will be given in a hospital or clinic and will not be stored at home. Storage for syringes given under the skin and stored at home: Keep out of the reach of children. Store in a refrigerator between 2 and 8 degrees C (36 and 46 degrees F). Keep this medicine in the original container. Protect from light. Do not freeze. Throw away any unused medicine after the expiration date. NOTE: This sheet is a summary. It may not cover all possible information. If you have questions about this medicine, talk to your doctor, pharmacist, or health care provider.  2019 Elsevier/Gold Standard (2016-07-01 10:07:35)  

## 2019-02-23 ENCOUNTER — Inpatient Hospital Stay: Payer: Medicare Other

## 2019-02-23 ENCOUNTER — Other Ambulatory Visit: Payer: Self-pay

## 2019-02-23 ENCOUNTER — Other Ambulatory Visit: Payer: Self-pay | Admitting: Hematology and Oncology

## 2019-02-23 ENCOUNTER — Encounter: Payer: Self-pay | Admitting: Hematology and Oncology

## 2019-02-23 ENCOUNTER — Inpatient Hospital Stay: Payer: Medicare Other | Attending: Oncology | Admitting: Hematology and Oncology

## 2019-02-23 VITALS — BP 164/72 | HR 65 | Resp 20

## 2019-02-23 VITALS — BP 135/63 | HR 71 | Temp 97.9°F | Resp 20 | Wt 184.5 lb

## 2019-02-23 DIAGNOSIS — M069 Rheumatoid arthritis, unspecified: Secondary | ICD-10-CM

## 2019-02-23 DIAGNOSIS — R197 Diarrhea, unspecified: Secondary | ICD-10-CM | POA: Diagnosis not present

## 2019-02-23 LAB — C DIFFICILE QUICK SCREEN W PCR REFLEX
C Diff antigen: NEGATIVE
C Diff interpretation: NOT DETECTED
C Diff toxin: NEGATIVE

## 2019-02-23 MED ORDER — SODIUM CHLORIDE 0.9 % IV SOLN
Freq: Once | INTRAVENOUS | Status: AC
  Administered 2019-02-23: 13:00:00 via INTRAVENOUS
  Filled 2019-02-23: qty 250

## 2019-02-23 MED ORDER — SODIUM CHLORIDE 0.9 % IV SOLN
1000.0000 mg | INTRAVENOUS | Status: DC
  Administered 2019-02-23: 1000 mg via INTRAVENOUS
  Filled 2019-02-23: qty 40

## 2019-02-23 NOTE — Progress Notes (Signed)
Patient here today for follow up regarding RA, Orencia infusion. Patient reports diarrhea for 3 weeks. Patient denies fever, pain or nausea/vomiting.

## 2019-02-23 NOTE — Progress Notes (Signed)
[facility]     [address], [address]     [place], [place] [address]     Phone: [phone]      Fax: [phone]        Clinic day:  02/23/2019  Referring physician:  Elaina Pattee, MD   Chief Complaint: [name] is a 73 y.o. male with rheumatoid arthritis who is seen for new patient assessment and continuation of Orencia (abatacept).  HPI:  The patient has had rheumatoid arthritis since 1988. He was initially treated with Remicade, which "just stopped working". Patient was transitioned to [place] at [facility]. He is followed by Dr. Remer Macho (rheumatology) at [facility].   The patient was last seen by Dr. Delight Hoh on 08/11/2018.  At that time, he was doing well.  He was tolerating his Orencia without significant side effects.  He had no progression of his arthritis.  He was asymptomatic  He has continue Orencia monthly (08/12/2015 - 01/26/2019).    During the interim, patient has been doing well overall.  He denies any nausea or vomiting. He notes a 3 week history of loose stools. Patient denies that he has experienced any B symptoms. He denies any interval infections. Patient states, "I am a good judge of my own health".   He has a history of head and neck cancer treated at [facility]. He was treated with chemotherapy and radiation. He notes significant issues with post-radiation xerostomia.   Patient advises that he maintains an adequate appetite. He is eating well. Weight today is 184 lb 8.4 oz (83.7 kg).  Patient complains of pain rated 4/10 in the clinic today.   Past Medical History:  Diagnosis Date  . Aortic aneurysm ([place]) 05/2017  . Chronic back pain   . Diabetes mellitus without complication ([place])   . Dyslipidemia   . GERD (gastroesophageal reflux disease)   . H/O hiatal hernia   . Hypertension   . Melanoma ([place])    "back; left arm"  . PONV (postoperative nausea and vomiting)   . Rheumatoid arthritis ([place])   . Spasm of  esophagus     Past Surgical History:  Procedure Laterality Date  . aneursym repair    . aneursym repair and stent placement  05/2017  . CARPAL TUNNEL RELEASE Bilateral   . COLON SURGERY  2007   removed 18" of colon  . ERCP W/ METAL STENT PLACEMENT  11/2005   Archie Endo 12/14/2005  . ESOPHAGOGASTRODUODENOSCOPY (EGD) WITH ESOPHAGEAL DILATION     "several times'  . HAND SURGERY Bilateral    "plastic knuckles"  . LAPAROSCOPIC CHOLECYSTECTOMY  01/2006  . MELANOMA EXCISION     "back; left arm"  . NASAL SEPTUM SURGERY    . PELVIC FRACTURE SURGERY  1964   "busted in 2 places"  . RESECTION DISTAL CLAVICAL Right 05/13/2016   Procedure: DISTAL CLAVICLE EXCISION;  Surgeon: Ninetta Lights, MD;  Location: [facility];  Service: Orthopedics;  Laterality: Right;  . SHOULDER ARTHROSCOPY WITH ROTATOR CUFF REPAIR AND SUBACROMIAL DECOMPRESSION Right 05/13/2016   Procedure: RIGHT SHOULDER SCOPE DEBRIDEMENT, ACROMIOPLASTY, ROTATOR CUFF REPAIR; RELEASE BICEPS TENDON AND DEBRIDEMENT LABRUM;  Surgeon: Ninetta Lights, MD;  Location: [facility];  Service: Orthopedics;  Laterality: Right;  . TIBIA FRACTURE SURGERY Left 2006   "put a steel rod in it"    Family History  Problem Relation Age of Onset  . Heart disease Mother   . Heart disease Father   .  Cancer Maternal Grandmother     Social History:  reports that he quit smoking about 13 years ago. His smoking use included cigarettes. He has a 46.00 pack-year smoking history. He has never used smokeless tobacco. He reports current alcohol use of about 9.0 standard drinks of alcohol per week. He reports that he does not use drugs.  He lives in [place].  The patient is alone today.  Allergies: No Known Allergies  Current Medications: Current Outpatient Medications  Medication Sig Dispense Refill  . abatacept (ORENCIA) 250 MG injection Inject into the vein every 30 (thirty) days.    Marland Kitchen aspirin 325 MG tablet Take 325 mg by mouth  daily.    Marland Kitchen atorvastatin (LIPITOR) 20 MG tablet Take 20 mg by mouth daily.    Marland Kitchen azelastine (ASTELIN) 137 MCG/SPRAY nasal spray Place 1 spray into the nose 2 (two) times daily. Use in each nostril as directed    . fluticasone (FLONASE) 50 MCG/ACT nasal spray Place 2 sprays into the nose daily.    . folic acid (FOLVITE) 1 MG tablet Take 1 mg by mouth daily.    . magnesium oxide (MAG-OX) 400 MG tablet Take 800 mg by mouth 3 (three) times daily.    . metFORMIN (GLUCOPHAGE) 500 MG tablet Take 500 mg by mouth daily with breakfast.    . methotrexate (RHEUMATREX) 2.5 MG tablet Take 2.5 mg by mouth once a week.    Marland Kitchen omeprazole (PRILOSEC) 20 MG capsule Take 20 mg by mouth daily.     Marland Kitchen oxyCODONE-acetaminophen (ROXICET) 5-325 MG tablet Take 1-2 tablets by mouth every 4 (four) hours as needed. 60 tablet 0  . potassium chloride (K-DUR) 10 MEQ tablet Take 10 mEq by mouth daily.     . predniSONE (DELTASONE) 5 MG tablet Take 5 mg by mouth daily with breakfast.    . tadalafil (CIALIS) 5 MG tablet Take 5 mg by mouth daily.     No current facility-administered medications for this visit.    Facility-Administered Medications Ordered in Other Visits  Medication Dose Route Frequency Provider Last Rate Last Dose  . 0.9 %  sodium chloride infusion   Intravenous Once Lloyd Huger, MD      . 0.9 %  sodium chloride infusion   Intravenous Once Lloyd Huger, MD      . abatacept [facility]) 1,000 mg in sodium chloride 0.9 % 100 mL IVPB  1,000 mg Intravenous Q28 days Lloyd Huger, MD      . abatacept [facility]) 1,000 mg in sodium chloride 0.9 % 100 mL IVPB  1,000 mg Intravenous Q28 days Lloyd Huger, MD        Review of Systems:  GENERAL:  No concerns.  No fevers, sweats or weight loss. PERFORMANCE STATUS (ECOG):  1 HEENT:  H/o head and neck cancer.  No visual changes, runny nose, sore throat, mouth sores or tenderness.  Xerostomia. Lungs: No shortness of breath or cough.  No  hemoptysis. Cardiac:  No chest pain, palpitations, orthopnea, or PND. GI:  Loose stools x 3 weeks.  No nausea, vomiting, diarrhea, constipation, melena or hematochezia. GU:  No urgency, frequency, dysuria, or hematuria. Musculoskeletal:  No back pain.  Rheumatoid arthritis.  No muscle tenderness. Extremities:  No pain or swelling. Skin:  No rashes or skin changes. Neuro:  No headache, numbness or weakness, balance or coordination issues. Endocrine:  Diabetes.  No thyroid issues, hot flashes or night sweats. Psych:  No mood changes, depression or anxiety. Pain:  No focal pain. Review of systems:  All other systems reviewed and found to be negative.  Physical Exam: Blood pressure 135/63, pulse 71, temperature 97.9 F (36.6 C), temperature source Tympanic, resp. rate 20, weight 184 lb 8.4 oz (83.7 kg). GENERAL:  Well developed, well nourished, gentleman sitting comfortably in the exam room in no acute distress. MENTAL STATUS:  Alert and oriented to person, place and time. HEAD:  Short gray hair.  Normocephalic, atraumatic, face symmetric, no Cushingoid features. EYES:  Pupils equal round and reactive to light and accomodation.  No conjunctivitis or scleral icterus. ENT:  Oropharynx clear without lesion.  Tongue normal. Mucous membranes moist.  RESPIRATORY:  Clear to auscultation without rales, wheezes or rhonchi. CARDIOVASCULAR:  Regular rate and rhythm without murmur, rub or gallop. ABDOMEN:  Soft, non-tender, with active bowel sounds, and no hepatosplenomegaly.  No masses. SKIN:  No rashes, ulcers or lesions. EXTREMITIES: No edema, no skin discoloration or tenderness.  No palpable cords. LYMPH NODES: No palpable cervical, supraclavicular, axillary or inguinal adenopathy  NEUROLOGICAL: Unremarkable. PSYCH:  Appropriate.   No visits with results within 3 Day(s) from this visit.  Latest known visit with results is:  Admission on 05/13/2016, Discharged on 05/13/2016  Component Date Value  Ref Range Status  . Sodium 05/07/2016 132* 135 - 145 mmol/L Final  . Potassium 05/07/2016 3.8  3.5 - 5.1 mmol/L Final  . Chloride 05/07/2016 96* 101 - 111 mmol/L Final  . CO2 05/07/2016 24  22 - 32 mmol/L Final  . Glucose, Bld 05/07/2016 131* 65 - 99 mg/dL Final  . BUN 05/07/2016 12  6 - 20 mg/dL Final  . Creatinine, Ser 05/07/2016 0.98  0.61 - 1.24 mg/dL Final  . Calcium 05/07/2016 9.3  8.9 - 10.3 mg/dL Final  . GFR calc non Af Amer 05/07/2016 >60  >60 mL/min Final  . GFR calc Af Amer 05/07/2016 >60  >60 mL/min Final   Comment: (NOTE) The eGFR has been calculated using the CKD EPI equation. This calculation has not been validated in all clinical situations. eGFR's persistently <60 mL/min signify possible Chronic Kidney Disease.   Georgiann Hahn gap 05/07/2016 12  5 - 15 Final    Assessment:  [name] is a 73 y.o. male with rheumatoid arthritis.  He was diagnosed in 38.  He was initially treated with Remicade.  He receives [place] every month.  He received his first infusion on 07/25/2015 (last 01/26/2019).  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  He was treated with chemotherapy and radiation at [facility].  Treatment completed on 05/05/2015.  Symptomatically, he is doing well.  He denies any concerns.  Plan: 1.   Rheumatoid arthritis  Review entire medical history, diagnosis and treatment to date.  Orencia (abatacept) every month.  Review potential side effects:  infusion reaction, infections, malignancy.  Review role of the infusion center. 2.   Loose stools  Etiology unclear.  Contact Dr. Remer Macho [phone]).  Send stool for C diff. 3.   RTC monthly for Orencia. 4.   RTC in 6 months for MD assessment and Orencia.  Addendum:  Stool was negative for C diff.   Honor Loh, NP  02/23/2019, 10:12 AM   I saw and evaluated the patient, participating in the key portions of the service and reviewing pertinent diagnostic  studies and records.  I reviewed the nurse practitioner's note and agree with the findings and the plan.  The  assessment and plan were discussed with the patient.  Several questions were asked by the patient and answered.   Nolon Stalls, MD 02/23/2019,10:12 AM

## 2019-02-23 NOTE — Patient Instructions (Signed)
Abatacept solution for injection (subcutaneous or intravenous use) What is this medicine? ABATACEPT (a ba TA sept) is used to treat moderate to severe active rheumatoid arthritis or psoriatic arthritis in adults. This medicine is also used to treat juvenile idiopathic arthritis. This medicine may be used for other purposes; ask your health care provider or pharmacist if you have questions. COMMON BRAND NAME(S): Orencia What should I tell my health care provider before I take this medicine? They need to know if you have any of these conditions: -are taking other medicines to treat rheumatoid arthritis -COPD -diabetes -infection or history of infections -recently received or scheduled to receive a vaccine -scheduled to have surgery -tuberculosis, a positive skin test for tuberculosis or have recently been in close contact with someone who has tuberculosis -viral hepatitis -an unusual or allergic reaction to abatacept, other medicines, foods, dyes, or preservatives -pregnant or trying to get pregnant -breast-feeding How should I use this medicine? This medicine is for infusion into a vein or for injection under the skin. Infusions are given by a health care professional in a hospital or clinic setting. If you are to give your own medicine at home, you will be taught how to prepare and give this medicine under the skin. Use exactly as directed. Take your medicine at regular intervals. Do not take your medicine more often than directed. It is important that you put your used needles and syringes in a special sharps container. Do not put them in a trash can. If you do not have a sharps container, call your pharmacist or healthcare provider to get one. Talk to your pediatrician regarding the use of this medicine in children. While infusions in a clinic may be prescribed for children as young as 2 years for selected conditions, precautions do apply. Overdosage: If you think you have taken too much of  this medicine contact a poison control center or emergency room at once. NOTE: This medicine is only for you. Do not share this medicine with others. What if I miss a dose? This medicine is used once a week if given by injection under the skin. If you miss a dose, take it as soon as you can. If it is almost time for your next dose, take only that dose. Do not take double or extra doses. If you are to be given an infusion, it is important not to miss your dose. Doses are usually every 4 weeks. Call your doctor or health care professional if you are unable to keep an appointment. What may interact with this medicine? Do not take this medicine with any of the following medications: -adalimumab -anakinra -certolizumab -etanercept -golimumab -infliximab -live virus vaccines -rituximab -tocilizumab This medicine may also interact with the following medications: -vaccines This list may not describe all possible interactions. Give your health care provider a list of all the medicines, herbs, non-prescription drugs, or dietary supplements you use. Also tell them if you smoke, drink alcohol, or use illegal drugs. Some items may interact with your medicine. What should I watch for while using this medicine? Visit your doctor for regular check ups while you are taking this medicine. Tell your doctor or healthcare professional if your symptoms do not start to get better or if they get worse. Call your doctor or health care professional if you get a cold or other infection while receiving this medicine. Do not treat yourself. This medicine may decrease your body's ability to fight infection. Try to avoid being around people who   are sick. What side effects may I notice from receiving this medicine? Side effects that you should report to your doctor or health care professional as soon as possible: -allergic reactions like skin rash, itching or hives, swelling of the face, lips, or tongue -breathing  problems -chest pain -signs of infection - fever or chills, cough, unusual tiredness, pain or trouble passing urine, or warm, red or painful skin Side effects that usually do not require medical attention (report to your doctor or health care professional if they continue or are bothersome): -dizziness -headache -nausea, vomiting -sore throat -stomach upset This list may not describe all possible side effects. Call your doctor for medical advice about side effects. You may report side effects to FDA at 1-800-FDA-1088. Where should I keep my medicine? Infusions will be given in a hospital or clinic and will not be stored at home. Storage for syringes given under the skin and stored at home: Keep out of the reach of children. Store in a refrigerator between 2 and 8 degrees C (36 and 46 degrees F). Keep this medicine in the original container. Protect from light. Do not freeze. Throw away any unused medicine after the expiration date. NOTE: This sheet is a summary. It may not cover all possible information. If you have questions about this medicine, talk to your doctor, pharmacist, or health care provider.  2019 Elsevier/Gold Standard (2016-07-01 10:07:35)  

## 2019-02-23 NOTE — Progress Notes (Unsigned)
C Diff test result was negative today. Proceed with Orencia treatment.

## 2019-03-22 ENCOUNTER — Other Ambulatory Visit: Payer: Self-pay

## 2019-03-23 ENCOUNTER — Other Ambulatory Visit: Payer: Self-pay

## 2019-03-23 ENCOUNTER — Inpatient Hospital Stay: Payer: Medicare Other | Attending: Hematology and Oncology

## 2019-03-23 VITALS — BP 107/57 | HR 68 | Temp 98.2°F | Resp 18

## 2019-03-23 DIAGNOSIS — M069 Rheumatoid arthritis, unspecified: Secondary | ICD-10-CM | POA: Insufficient documentation

## 2019-03-23 MED ORDER — SODIUM CHLORIDE 0.9 % IV SOLN
1000.0000 mg | INTRAVENOUS | Status: DC
  Administered 2019-03-23: 1000 mg via INTRAVENOUS
  Filled 2019-03-23: qty 40

## 2019-03-23 MED ORDER — SODIUM CHLORIDE 0.9 % IV SOLN
Freq: Once | INTRAVENOUS | Status: AC
  Administered 2019-03-23: 09:00:00 via INTRAVENOUS
  Filled 2019-03-23: qty 250

## 2019-04-20 ENCOUNTER — Inpatient Hospital Stay: Payer: Medicare Other | Attending: Hematology and Oncology

## 2019-04-20 ENCOUNTER — Other Ambulatory Visit: Payer: Self-pay

## 2019-04-20 VITALS — BP 124/67 | HR 62 | Temp 97.0°F | Resp 18

## 2019-04-20 DIAGNOSIS — M069 Rheumatoid arthritis, unspecified: Secondary | ICD-10-CM | POA: Diagnosis not present

## 2019-04-20 MED ORDER — SODIUM CHLORIDE 0.9 % IV SOLN
Freq: Once | INTRAVENOUS | Status: AC
  Administered 2019-04-20: 10:00:00 via INTRAVENOUS
  Filled 2019-04-20: qty 250

## 2019-04-20 MED ORDER — SODIUM CHLORIDE 0.9 % IV SOLN
1000.0000 mg | INTRAVENOUS | Status: DC
  Administered 2019-04-20: 1000 mg via INTRAVENOUS
  Filled 2019-04-20: qty 40

## 2019-05-15 ENCOUNTER — Other Ambulatory Visit: Payer: Self-pay

## 2019-05-16 ENCOUNTER — Inpatient Hospital Stay: Payer: Medicare Other | Attending: Hematology and Oncology

## 2019-05-16 VITALS — BP 144/72 | HR 60 | Temp 97.7°F | Resp 18

## 2019-05-16 DIAGNOSIS — M069 Rheumatoid arthritis, unspecified: Secondary | ICD-10-CM | POA: Insufficient documentation

## 2019-05-16 MED ORDER — SODIUM CHLORIDE 0.9 % IV SOLN
1000.0000 mg | INTRAVENOUS | Status: DC
  Administered 2019-05-16: 14:00:00 1000 mg via INTRAVENOUS
  Filled 2019-05-16: qty 40

## 2019-05-16 MED ORDER — SODIUM CHLORIDE 0.9 % IV SOLN
Freq: Once | INTRAVENOUS | Status: AC
  Administered 2019-05-16: 14:00:00 via INTRAVENOUS
  Filled 2019-05-16: qty 250

## 2019-05-16 NOTE — Patient Instructions (Signed)
Abatacept solution for injection (subcutaneous or intravenous use) What is this medicine? ABATACEPT (a ba TA sept) is used to treat moderate to severe active rheumatoid arthritis or psoriatic arthritis in adults. This medicine is also used to treat juvenile idiopathic arthritis. This medicine may be used for other purposes; ask your health care provider or pharmacist if you have questions. COMMON BRAND NAME(S): Orencia What should I tell my health care provider before I take this medicine? They need to know if you have any of these conditions: -are taking other medicines to treat rheumatoid arthritis -COPD -diabetes -infection or history of infections -recently received or scheduled to receive a vaccine -scheduled to have surgery -tuberculosis, a positive skin test for tuberculosis or have recently been in close contact with someone who has tuberculosis -viral hepatitis -an unusual or allergic reaction to abatacept, other medicines, foods, dyes, or preservatives -pregnant or trying to get pregnant -breast-feeding How should I use this medicine? This medicine is for infusion into a vein or for injection under the skin. Infusions are given by a health care professional in a hospital or clinic setting. If you are to give your own medicine at home, you will be taught how to prepare and give this medicine under the skin. Use exactly as directed. Take your medicine at regular intervals. Do not take your medicine more often than directed. It is important that you put your used needles and syringes in a special sharps container. Do not put them in a trash can. If you do not have a sharps container, call your pharmacist or healthcare provider to get one. Talk to your pediatrician regarding the use of this medicine in children. While infusions in a clinic may be prescribed for children as young as 2 years for selected conditions, precautions do apply. Overdosage: If you think you have taken too much of  this medicine contact a poison control center or emergency room at once. NOTE: This medicine is only for you. Do not share this medicine with others. What if I miss a dose? This medicine is used once a week if given by injection under the skin. If you miss a dose, take it as soon as you can. If it is almost time for your next dose, take only that dose. Do not take double or extra doses. If you are to be given an infusion, it is important not to miss your dose. Doses are usually every 4 weeks. Call your doctor or health care professional if you are unable to keep an appointment. What may interact with this medicine? Do not take this medicine with any of the following medications: -adalimumab -anakinra -certolizumab -etanercept -golimumab -infliximab -live virus vaccines -rituximab -tocilizumab This medicine may also interact with the following medications: -vaccines This list may not describe all possible interactions. Give your health care provider a list of all the medicines, herbs, non-prescription drugs, or dietary supplements you use. Also tell them if you smoke, drink alcohol, or use illegal drugs. Some items may interact with your medicine. What should I watch for while using this medicine? Visit your doctor for regular check ups while you are taking this medicine. Tell your doctor or healthcare professional if your symptoms do not start to get better or if they get worse. Call your doctor or health care professional if you get a cold or other infection while receiving this medicine. Do not treat yourself. This medicine may decrease your body's ability to fight infection. Try to avoid being around people who  are sick. What side effects may I notice from receiving this medicine? Side effects that you should report to your doctor or health care professional as soon as possible: -allergic reactions like skin rash, itching or hives, swelling of the face, lips, or tongue -breathing  problems -chest pain -signs of infection - fever or chills, cough, unusual tiredness, pain or trouble passing urine, or warm, red or painful skin Side effects that usually do not require medical attention (report to your doctor or health care professional if they continue or are bothersome): -dizziness -headache -nausea, vomiting -sore throat -stomach upset This list may not describe all possible side effects. Call your doctor for medical advice about side effects. You may report side effects to FDA at 1-800-FDA-1088. Where should I keep my medicine? Infusions will be given in a hospital or clinic and will not be stored at home. Storage for syringes given under the skin and stored at home: Keep out of the reach of children. Store in a refrigerator between 2 and 8 degrees C (36 and 46 degrees F). Keep this medicine in the original container. Protect from light. Do not freeze. Throw away any unused medicine after the expiration date. NOTE: This sheet is a summary. It may not cover all possible information. If you have questions about this medicine, talk to your doctor, pharmacist, or health care provider.  2019 Elsevier/Gold Standard (2016-07-01 10:07:35)

## 2019-05-18 ENCOUNTER — Inpatient Hospital Stay: Payer: Medicare Other

## 2019-06-15 ENCOUNTER — Inpatient Hospital Stay: Payer: Medicare Other

## 2019-06-19 ENCOUNTER — Other Ambulatory Visit: Payer: Self-pay

## 2019-06-19 ENCOUNTER — Inpatient Hospital Stay: Payer: Medicare Other | Attending: Hematology and Oncology

## 2019-06-19 VITALS — BP 188/72 | HR 69 | Temp 97.7°F | Resp 18

## 2019-06-19 DIAGNOSIS — M069 Rheumatoid arthritis, unspecified: Secondary | ICD-10-CM | POA: Insufficient documentation

## 2019-06-19 MED ORDER — SODIUM CHLORIDE 0.9 % IV SOLN
1000.0000 mg | INTRAVENOUS | Status: DC
  Administered 2019-06-19: 1000 mg via INTRAVENOUS
  Filled 2019-06-19: qty 40

## 2019-06-19 MED ORDER — SODIUM CHLORIDE 0.9 % IV SOLN
Freq: Once | INTRAVENOUS | Status: AC
  Administered 2019-06-19: 10:00:00 via INTRAVENOUS
  Filled 2019-06-19: qty 250

## 2019-07-13 ENCOUNTER — Inpatient Hospital Stay: Payer: Medicare Other | Attending: Hematology and Oncology

## 2019-07-13 ENCOUNTER — Other Ambulatory Visit: Payer: Self-pay

## 2019-07-13 VITALS — BP 120/58 | HR 57 | Temp 97.6°F | Resp 18

## 2019-07-13 DIAGNOSIS — M069 Rheumatoid arthritis, unspecified: Secondary | ICD-10-CM | POA: Insufficient documentation

## 2019-07-13 MED ORDER — SODIUM CHLORIDE 0.9 % IV SOLN
1000.0000 mg | INTRAVENOUS | Status: DC
  Administered 2019-07-13: 1000 mg via INTRAVENOUS
  Filled 2019-07-13: qty 40

## 2019-07-13 MED ORDER — SODIUM CHLORIDE 0.9 % IV SOLN
Freq: Once | INTRAVENOUS | Status: AC
  Administered 2019-07-13: 09:00:00 via INTRAVENOUS
  Filled 2019-07-13: qty 250

## 2019-07-13 NOTE — Patient Instructions (Signed)
Abatacept solution for injection (subcutaneous or intravenous use) What is this medicine? ABATACEPT (a ba TA sept) is used to treat moderate to severe active rheumatoid arthritis or psoriatic arthritis in adults. This medicine is also used to treat juvenile idiopathic arthritis. This medicine may be used for other purposes; ask your health care provider or pharmacist if you have questions. COMMON BRAND NAME(S): Orencia What should I tell my health care provider before I take this medicine? They need to know if you have any of these conditions:  are taking other medicines to treat rheumatoid arthritis  COPD  diabetes  infection or history of infections  recently received or scheduled to receive a vaccine  scheduled to have surgery  tuberculosis, a positive skin test for tuberculosis or have recently been in close contact with someone who has tuberculosis  viral hepatitis  an unusual or allergic reaction to abatacept, other medicines, foods, dyes, or preservatives  pregnant or trying to get pregnant  breast-feeding How should I use this medicine? This medicine is for infusion into a vein or for injection under the skin. Infusions are given by a health care professional in a hospital or clinic setting. If you are to give your own medicine at home, you will be taught how to prepare and give this medicine under the skin. Use exactly as directed. Take your medicine at regular intervals. Do not take your medicine more often than directed. It is important that you put your used needles and syringes in a special sharps container. Do not put them in a trash can. If you do not have a sharps container, call your pharmacist or healthcare provider to get one. Talk to your pediatrician regarding the use of this medicine in children. While infusions in a clinic may be prescribed for children as young as 2 years for selected conditions, precautions do apply. Overdosage: If you think you have taken  too much of this medicine contact a poison control center or emergency room at once. NOTE: This medicine is only for you. Do not share this medicine with others. What if I miss a dose? This medicine is used once a week if given by injection under the skin. If you miss a dose, take it as soon as you can. If it is almost time for your next dose, take only that dose. Do not take double or extra doses. If you are to be given an infusion, it is important not to miss your dose. Doses are usually every 4 weeks. Call your doctor or health care professional if you are unable to keep an appointment. What may interact with this medicine? Do not take this medicine with any of the following medications:  adalimumab  anakinra  certolizumab  etanercept  golimumab  infliximab  live virus vaccines  rituximab  tocilizumab This medicine may also interact with the following medications:  vaccines This list may not describe all possible interactions. Give your health care provider a list of all the medicines, herbs, non-prescription drugs, or dietary supplements you use. Also tell them if you smoke, drink alcohol, or use illegal drugs. Some items may interact with your medicine. What should I watch for while using this medicine? Visit your doctor for regular check ups while you are taking this medicine. Tell your doctor or healthcare professional if your symptoms do not start to get better or if they get worse. Call your doctor or health care professional if you get a cold or other infection while receiving this medicine.  Do not treat yourself. This medicine may decrease your body's ability to fight infection. Try to avoid being around people who are sick. What side effects may I notice from receiving this medicine? Side effects that you should report to your doctor or health care professional as soon as possible:  allergic reactions like skin rash, itching or hives, swelling of the face, lips, or  tongue  breathing problems  chest pain  signs of infection - fever or chills, cough, unusual tiredness, pain or trouble passing urine, or warm, red or painful skin Side effects that usually do not require medical attention (report to your doctor or health care professional if they continue or are bothersome):  dizziness  headache  nausea, vomiting  sore throat  stomach upset This list may not describe all possible side effects. Call your doctor for medical advice about side effects. You may report side effects to FDA at 1-800-FDA-1088. Where should I keep my medicine? Infusions will be given in a hospital or clinic and will not be stored at home. Storage for syringes given under the skin and stored at home: Keep out of the reach of children. Store in a refrigerator between 2 and 8 degrees C (36 and 46 degrees F). Keep this medicine in the original container. Protect from light. Do not freeze. Throw away any unused medicine after the expiration date. NOTE: This sheet is a summary. It may not cover all possible information. If you have questions about this medicine, talk to your doctor, pharmacist, or health care provider.  2020 Elsevier/Gold Standard (2016-07-01 10:07:35)

## 2019-08-08 NOTE — Progress Notes (Signed)
[facility]  [address] [place], [place] [address] Phone: [phone]  Fax: [phone]   Clinic Day:  08/10/2019  Referring physician: Elaina Pattee, MD  Chief Complaint: [name] is a 73 y.o. male with rheumatoid arthritis who is seen for 6 month assessment and continuation of Orencia (abatacept).  HPI: The patient was last seen in the medical oncology clinic on 02/23/2019. At that time, he was doing well.  He denied any concerns. He had loose stools x3 weeks. C diff testing was negative.   He was seen in rheumatology on 02/28/2019 by Dr. Francella Solian. Wilfred Curtis.  He continued on methotrexate 25 mg weekly and abatacept 1000 mg IV every 4 weeks.   He was seen in vascular surgery on 07/02/2019 by Dr. Laverta Baltimore. CTA showed ICAs bilaterally with mild disease, up to 40% stenosis on the right and 30% on the left. He was asymptomatic. One  year follow-up with ultrasound was recommended.   During the interim, he has been feeling "fine." He denies any fevers, sweats, or weight loss. Loose stools have resolved. His rheumatoid artiritis is stable and tolerable. He notes continued issues with his tongue and teeth from radiation.    Past Medical History:  Diagnosis Date  . Aortic aneurysm ([place]) 05/2017  . Chronic back pain   . Diabetes mellitus without complication ([place])   . Dyslipidemia   . GERD (gastroesophageal reflux disease)   . H/O hiatal hernia   . Hypertension   . Melanoma ([place])    "back; left arm"  . PONV (postoperative nausea and vomiting)   . Rheumatoid arthritis ([place])   . Spasm of esophagus     Past Surgical History:  Procedure Laterality Date  . aneursym repair    . aneursym repair and stent placement  05/2017  . CARPAL TUNNEL RELEASE Bilateral   . COLON SURGERY  2007   removed 18" of colon  . ERCP W/ METAL STENT PLACEMENT  11/2005   Archie Endo 12/14/2005  . ESOPHAGOGASTRODUODENOSCOPY (EGD) WITH ESOPHAGEAL DILATION     "several times'   . HAND SURGERY Bilateral    "plastic knuckles"  . LAPAROSCOPIC CHOLECYSTECTOMY  01/2006  . MELANOMA EXCISION     "back; left arm"  . NASAL SEPTUM SURGERY    . PELVIC FRACTURE SURGERY  1964   "busted in 2 places"  . RESECTION DISTAL CLAVICAL Right 05/13/2016   Procedure: DISTAL CLAVICLE EXCISION;  Surgeon: Ninetta Lights, MD;  Location: [facility];  Service: Orthopedics;  Laterality: Right;  . SHOULDER ARTHROSCOPY WITH ROTATOR CUFF REPAIR AND SUBACROMIAL DECOMPRESSION Right 05/13/2016   Procedure: RIGHT SHOULDER SCOPE DEBRIDEMENT, ACROMIOPLASTY, ROTATOR CUFF REPAIR; RELEASE BICEPS TENDON AND DEBRIDEMENT LABRUM;  Surgeon: Ninetta Lights, MD;  Location: [facility];  Service: Orthopedics;  Laterality: Right;  . TIBIA FRACTURE SURGERY Left 2006   "put a steel rod in it"    Family History  Problem Relation Age of Onset  . Heart disease Mother   . Heart disease Father   . Cancer Maternal Grandmother     Social History:  reports that he quit smoking about 13 years ago. His smoking use included cigarettes. He has a 46.00 pack-year smoking history. He has never used smokeless tobacco. He reports current alcohol use of about 9.0 standard drinks of alcohol per week. He reports that he does not use drugs. He lives in [place]. 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 The patient  is alone today.  Allergies: No Known Allergies  Current Medications: Current Outpatient Medications  Medication Sig Dispense Refill  . abatacept (ORENCIA) 250 MG injection Inject into the vein every 30 (thirty) days.    Marland Kitchen aspirin 325 MG tablet Take 325 mg by mouth daily.    Marland Kitchen atorvastatin (LIPITOR) 20 MG tablet Take 20 mg by mouth daily.    . fluticasone (FLONASE) 50 MCG/ACT nasal spray Place 2 sprays into the nose daily.    . folic acid (FOLVITE) 1 MG tablet Take 1 mg by mouth daily.    . irbesartan (AVAPRO) 150 MG tablet Take 150 mg by mouth daily.     .  magnesium oxide (MAG-OX) 400 MG tablet Take 800 mg by mouth 3 (three) times daily.    . metFORMIN (GLUCOPHAGE) 500 MG tablet Take 500 mg by mouth daily with breakfast.    . methotrexate (RHEUMATREX) 2.5 MG tablet Take 2.5 mg by mouth once a week.    . Multiple Vitamin (MULTI-VITAMIN) tablet Take by mouth.    Marland Kitchen omeprazole (PRILOSEC) 20 MG capsule Take 20 mg by mouth daily.     Marland Kitchen oxyCODONE-acetaminophen (ROXICET) 5-325 MG tablet Take 1-2 tablets by mouth every 4 (four) hours as needed. 60 tablet 0  . potassium chloride (K-DUR) 10 MEQ tablet Take 10 mEq by mouth daily.     . predniSONE (DELTASONE) 5 MG tablet Take 5 mg by mouth daily with breakfast.    . tadalafil (CIALIS) 5 MG tablet Take 5 mg by mouth daily.    Marland Kitchen tretinoin (RETIN-A) 0.05 % cream Apply topically.     No current facility-administered medications for this visit.    Facility-Administered Medications Ordered in Other Visits  Medication Dose Route Frequency Provider Last Rate Last Dose  . 0.9 %  sodium chloride infusion   Intravenous Once Lloyd Huger, MD      . 0.9 %  sodium chloride infusion   Intravenous Once Lloyd Huger, MD      . abatacept [facility]) 1,000 mg in sodium chloride 0.9 % 100 mL IVPB  1,000 mg Intravenous Q28 days Lloyd Huger, MD      . abatacept [facility]) 1,000 mg in sodium chloride 0.9 % 100 mL IVPB  1,000 mg Intravenous Q28 days Lloyd Huger, MD      . abatacept [facility]) 1,000 mg in sodium chloride 0.9 % 100 mL IVPB  1,000 mg Intravenous Q28 days Lloyd Huger, MD   Stopped at 02/23/19 1340    Review of Systems  Constitutional: Negative.  Negative for chills, diaphoresis, fever, malaise/fatigue and weight loss.       Feeling "fine."  HENT: Negative.  Negative for congestion, hearing loss, sinus pain and sore throat.        Tongue and teeth issues s/p radiation  Eyes: Negative.  Negative for blurred vision, double vision, photophobia and pain.  Respiratory: Negative.   Negative for cough, shortness of breath and wheezing.   Cardiovascular: Negative.  Negative for chest pain, palpitations, claudication, leg swelling and PND.  Gastrointestinal: Negative.  Negative for abdominal pain, blood in stool, constipation, diarrhea, melena, nausea and vomiting.  Genitourinary: Negative.  Negative for dysuria, frequency, hematuria and urgency.  Musculoskeletal: Positive for joint pain (rhuematoid arthritis). Negative for back pain and myalgias.  Skin: Negative.  Negative for rash.  Neurological: Negative.  Negative for dizziness, tingling, sensory change, focal weakness and weakness.  Endo/Heme/Allergies: Negative.  Does not bruise/bleed easily.  Diabetes  Psychiatric/Behavioral: Negative.  Negative for depression, memory loss and substance abuse. The patient is not nervous/anxious and does not have insomnia.   All other systems reviewed and are negative.  Performance status (ECOG): 1  Vitals Blood pressure (!) 141/57, pulse 62, temperature (!) 97.2 F (36.2 C), temperature source Tympanic, resp. rate 16, weight 190 lb 4.1 oz (86.3 kg), SpO2 100 %.   Physical Exam  Constitutional: He is oriented to person, place, and time. He appears well-developed and well-nourished. No distress.  HENT:  Head: Normocephalic and atraumatic.  Mouth/Throat: Oropharynx is clear and moist. No oropharyngeal exudate.  Short gray hair. Wearing a mask.  Eyes: Pupils are equal, round, and reactive to light. Conjunctivae and EOM are normal. No scleral icterus.  Glasses.   Neck: Normal range of motion. Neck supple.  Cardiovascular: Normal rate, regular rhythm and normal heart sounds.  No murmur heard. Pulmonary/Chest: Effort normal and breath sounds normal. No respiratory distress. He has no wheezes.  Abdominal: Soft. Bowel sounds are normal. He exhibits no distension. There is no abdominal tenderness.  Musculoskeletal: Normal range of motion.        General: No edema.   Lymphadenopathy:    He has no cervical adenopathy.    He has no axillary adenopathy.       Right: No supraclavicular adenopathy present.       Left: No supraclavicular adenopathy present.  Neurological: He is alert and oriented to person, place, and time.  Skin: Skin is warm and dry. He is not diaphoretic. No erythema.  Psychiatric: He has a normal mood and affect. His behavior is normal. Judgment and thought content normal.  Nursing note and vitals reviewed.   No visits with results within 3 Day(s) from this visit.  Latest known visit with results is:  Infusion on 02/23/2019  Component Date Value Ref Range Status  . C Diff antigen 02/23/2019 NEGATIVE  NEGATIVE Final  . C Diff toxin 02/23/2019 NEGATIVE  NEGATIVE Final  . C Diff interpretation 02/23/2019 No C. difficile detected.   Final   Performed at [facility], [address], [place], [place] [address]    Assessment:  BASEL DEFALCO is a 73 y.o. male with rheumatoid arthritis.  He was diagnosed in 29.  He was initially treated with Remicade.  He receives MTX weekly.  He receives [place] every month.  He received his first infusion on 07/25/2015 (last 07/13/2019).  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  He was treated with chemotherapy and radiation at [facility].  Treatment completed on 05/05/2015.  Symptomatically, he feels "fine".  He denies any concerns.  Exam is stable.  Plan: 1.  Rheumatoid arthritis             Patient is clinically doing well.  Patient tolerating treatment.  Continue monthly Orencia (abatacept) per rheumatology. 2.   RTC monthly for Orencia.   3.   RTC in 6 months for MD assessment and Orencia.  I discussed the assessment and treatment plan with the patient.  The patient was provided an opportunity to ask questions and all were answered.  The patient agreed with the plan and demonstrated an understanding of the instructions.  The  patient was advised to call back if the symptoms worsen or if the condition fails to improve as anticipated.   Lequita Asal, MD, PhD    08/10/2019, 10:15 AM  I, Molly Dorshimer, am  acting as Education administrator for [employer]. Mike Gip, MD, PhD.  I,  C. Mike Gip, MD, have reviewed the above documentation for accuracy and completeness, and I agree with the above.

## 2019-08-09 ENCOUNTER — Other Ambulatory Visit: Payer: Self-pay

## 2019-08-10 ENCOUNTER — Encounter: Payer: Self-pay | Admitting: Hematology and Oncology

## 2019-08-10 ENCOUNTER — Inpatient Hospital Stay: Payer: Medicare Other

## 2019-08-10 ENCOUNTER — Inpatient Hospital Stay: Payer: Medicare Other | Attending: Hematology and Oncology | Admitting: Hematology and Oncology

## 2019-08-10 VITALS — BP 141/57 | HR 62 | Temp 97.2°F | Resp 16 | Wt 190.3 lb

## 2019-08-10 VITALS — BP 181/65 | HR 58 | Temp 97.2°F | Resp 16

## 2019-08-10 DIAGNOSIS — Z85810 Personal history of malignant neoplasm of tongue: Secondary | ICD-10-CM | POA: Diagnosis not present

## 2019-08-10 DIAGNOSIS — I10 Essential (primary) hypertension: Secondary | ICD-10-CM | POA: Diagnosis not present

## 2019-08-10 DIAGNOSIS — M069 Rheumatoid arthritis, unspecified: Secondary | ICD-10-CM | POA: Diagnosis present

## 2019-08-10 DIAGNOSIS — Z7984 Long term (current) use of oral hypoglycemic drugs: Secondary | ICD-10-CM | POA: Diagnosis not present

## 2019-08-10 DIAGNOSIS — Z923 Personal history of irradiation: Secondary | ICD-10-CM | POA: Insufficient documentation

## 2019-08-10 DIAGNOSIS — E119 Type 2 diabetes mellitus without complications: Secondary | ICD-10-CM | POA: Diagnosis not present

## 2019-08-10 DIAGNOSIS — E785 Hyperlipidemia, unspecified: Secondary | ICD-10-CM | POA: Insufficient documentation

## 2019-08-10 DIAGNOSIS — Z85820 Personal history of malignant melanoma of skin: Secondary | ICD-10-CM | POA: Insufficient documentation

## 2019-08-10 DIAGNOSIS — Z7982 Long term (current) use of aspirin: Secondary | ICD-10-CM | POA: Insufficient documentation

## 2019-08-10 DIAGNOSIS — Z79899 Other long term (current) drug therapy: Secondary | ICD-10-CM | POA: Diagnosis not present

## 2019-08-10 DIAGNOSIS — F17210 Nicotine dependence, cigarettes, uncomplicated: Secondary | ICD-10-CM | POA: Insufficient documentation

## 2019-08-10 DIAGNOSIS — Z9221 Personal history of antineoplastic chemotherapy: Secondary | ICD-10-CM | POA: Insufficient documentation

## 2019-08-10 MED ORDER — SODIUM CHLORIDE 0.9 % IV SOLN
1000.0000 mg | INTRAVENOUS | Status: DC
  Administered 2019-08-10: 1000 mg via INTRAVENOUS
  Filled 2019-08-10: qty 40

## 2019-08-10 MED ORDER — SODIUM CHLORIDE 0.9 % IV SOLN
Freq: Once | INTRAVENOUS | Status: AC
  Administered 2019-08-10: 11:00:00 via INTRAVENOUS
  Filled 2019-08-10: qty 250

## 2019-08-10 NOTE — Progress Notes (Signed)
Pt here for follow up. Denies any concerns.  

## 2019-08-10 NOTE — Patient Instructions (Signed)
Abatacept solution for injection (subcutaneous or intravenous use) What is this medicine? ABATACEPT (a ba TA sept) is used to treat moderate to severe active rheumatoid arthritis or psoriatic arthritis in adults. This medicine is also used to treat juvenile idiopathic arthritis. This medicine may be used for other purposes; ask your health care provider or pharmacist if you have questions. COMMON BRAND NAME(S): Orencia What should I tell my health care provider before I take this medicine? They need to know if you have any of these conditions:  are taking other medicines to treat rheumatoid arthritis  COPD  diabetes  infection or history of infections  recently received or scheduled to receive a vaccine  scheduled to have surgery  tuberculosis, a positive skin test for tuberculosis or have recently been in close contact with someone who has tuberculosis  viral hepatitis  an unusual or allergic reaction to abatacept, other medicines, foods, dyes, or preservatives  pregnant or trying to get pregnant  breast-feeding How should I use this medicine? This medicine is for infusion into a vein or for injection under the skin. Infusions are given by a health care professional in a hospital or clinic setting. If you are to give your own medicine at home, you will be taught how to prepare and give this medicine under the skin. Use exactly as directed. Take your medicine at regular intervals. Do not take your medicine more often than directed. It is important that you put your used needles and syringes in a special sharps container. Do not put them in a trash can. If you do not have a sharps container, call your pharmacist or healthcare provider to get one. Talk to your pediatrician regarding the use of this medicine in children. While infusions in a clinic may be prescribed for children as young as 2 years for selected conditions, precautions do apply. Overdosage: If you think you have taken  too much of this medicine contact a poison control center or emergency room at once. NOTE: This medicine is only for you. Do not share this medicine with others. What if I miss a dose? This medicine is used once a week if given by injection under the skin. If you miss a dose, take it as soon as you can. If it is almost time for your next dose, take only that dose. Do not take double or extra doses. If you are to be given an infusion, it is important not to miss your dose. Doses are usually every 4 weeks. Call your doctor or health care professional if you are unable to keep an appointment. What may interact with this medicine? Do not take this medicine with any of the following medications:  adalimumab  anakinra  certolizumab  etanercept  golimumab  infliximab  live virus vaccines  rituximab  tocilizumab This medicine may also interact with the following medications:  vaccines This list may not describe all possible interactions. Give your health care provider a list of all the medicines, herbs, non-prescription drugs, or dietary supplements you use. Also tell them if you smoke, drink alcohol, or use illegal drugs. Some items may interact with your medicine. What should I watch for while using this medicine? Visit your doctor for regular check ups while you are taking this medicine. Tell your doctor or healthcare professional if your symptoms do not start to get better or if they get worse. Call your doctor or health care professional if you get a cold or other infection while receiving this medicine.  Do not treat yourself. This medicine may decrease your body's ability to fight infection. Try to avoid being around people who are sick. What side effects may I notice from receiving this medicine? Side effects that you should report to your doctor or health care professional as soon as possible:  allergic reactions like skin rash, itching or hives, swelling of the face, lips, or  tongue  breathing problems  chest pain  signs of infection - fever or chills, cough, unusual tiredness, pain or trouble passing urine, or warm, red or painful skin Side effects that usually do not require medical attention (report to your doctor or health care professional if they continue or are bothersome):  dizziness  headache  nausea, vomiting  sore throat  stomach upset This list may not describe all possible side effects. Call your doctor for medical advice about side effects. You may report side effects to FDA at 1-800-FDA-1088. Where should I keep my medicine? Infusions will be given in a hospital or clinic and will not be stored at home. Storage for syringes given under the skin and stored at home: Keep out of the reach of children. Store in a refrigerator between 2 and 8 degrees C (36 and 46 degrees F). Keep this medicine in the original container. Protect from light. Do not freeze. Throw away any unused medicine after the expiration date. NOTE: This sheet is a summary. It may not cover all possible information. If you have questions about this medicine, talk to your doctor, pharmacist, or health care provider.  2020 Elsevier/Gold Standard (2016-07-01 10:07:35)

## 2019-09-06 ENCOUNTER — Other Ambulatory Visit: Payer: Self-pay

## 2019-09-07 ENCOUNTER — Inpatient Hospital Stay: Payer: Medicare Other | Attending: Hematology and Oncology

## 2019-09-07 VITALS — BP 132/78 | HR 62 | Temp 96.9°F | Resp 18

## 2019-09-07 DIAGNOSIS — M069 Rheumatoid arthritis, unspecified: Secondary | ICD-10-CM | POA: Insufficient documentation

## 2019-09-07 MED ORDER — SODIUM CHLORIDE 0.9 % IV SOLN
Freq: Once | INTRAVENOUS | Status: AC
  Administered 2019-09-07: 10:00:00 via INTRAVENOUS
  Filled 2019-09-07: qty 250

## 2019-09-07 MED ORDER — SODIUM CHLORIDE 0.9 % IV SOLN
1000.0000 mg | INTRAVENOUS | Status: DC
  Administered 2019-09-07: 1000 mg via INTRAVENOUS
  Filled 2019-09-07: qty 40

## 2019-09-07 NOTE — Patient Instructions (Signed)
Abatacept solution for injection (subcutaneous or intravenous use) What is this medicine? ABATACEPT (a ba TA sept) is used to treat moderate to severe active rheumatoid arthritis or psoriatic arthritis in adults. This medicine is also used to treat juvenile idiopathic arthritis. This medicine may be used for other purposes; ask your health care provider or pharmacist if you have questions. COMMON BRAND NAME(S): Orencia What should I tell my health care provider before I take this medicine? They need to know if you have any of these conditions:  are taking other medicines to treat rheumatoid arthritis  COPD  diabetes  infection or history of infections  recently received or scheduled to receive a vaccine  scheduled to have surgery  tuberculosis, a positive skin test for tuberculosis or have recently been in close contact with someone who has tuberculosis  viral hepatitis  an unusual or allergic reaction to abatacept, other medicines, foods, dyes, or preservatives  pregnant or trying to get pregnant  breast-feeding How should I use this medicine? This medicine is for infusion into a vein or for injection under the skin. Infusions are given by a health care professional in a hospital or clinic setting. If you are to give your own medicine at home, you will be taught how to prepare and give this medicine under the skin. Use exactly as directed. Take your medicine at regular intervals. Do not take your medicine more often than directed. It is important that you put your used needles and syringes in a special sharps container. Do not put them in a trash can. If you do not have a sharps container, call your pharmacist or healthcare provider to get one. Talk to your pediatrician regarding the use of this medicine in children. While infusions in a clinic may be prescribed for children as young as 2 years for selected conditions, precautions do apply. Overdosage: If you think you have taken  too much of this medicine contact a poison control center or emergency room at once. NOTE: This medicine is only for you. Do not share this medicine with others. What if I miss a dose? This medicine is used once a week if given by injection under the skin. If you miss a dose, take it as soon as you can. If it is almost time for your next dose, take only that dose. Do not take double or extra doses. If you are to be given an infusion, it is important not to miss your dose. Doses are usually every 4 weeks. Call your doctor or health care professional if you are unable to keep an appointment. What may interact with this medicine? Do not take this medicine with any of the following medications:  adalimumab  anakinra  certolizumab  etanercept  golimumab  infliximab  live virus vaccines  rituximab  tocilizumab This medicine may also interact with the following medications:  vaccines This list may not describe all possible interactions. Give your health care provider a list of all the medicines, herbs, non-prescription drugs, or dietary supplements you use. Also tell them if you smoke, drink alcohol, or use illegal drugs. Some items may interact with your medicine. What should I watch for while using this medicine? Visit your doctor for regular check ups while you are taking this medicine. Tell your doctor or healthcare professional if your symptoms do not start to get better or if they get worse. Call your doctor or health care professional if you get a cold or other infection while receiving this medicine.   Do not treat yourself. This medicine may decrease your body's ability to fight infection. Try to avoid being around people who are sick. What side effects may I notice from receiving this medicine? Side effects that you should report to your doctor or health care professional as soon as possible:  allergic reactions like skin rash, itching or hives, swelling of the face, lips, or  tongue  breathing problems  chest pain  signs of infection - fever or chills, cough, unusual tiredness, pain or trouble passing urine, or warm, red or painful skin Side effects that usually do not require medical attention (report to your doctor or health care professional if they continue or are bothersome):  dizziness  headache  nausea, vomiting  sore throat  stomach upset This list may not describe all possible side effects. Call your doctor for medical advice about side effects. You may report side effects to FDA at 1-800-FDA-1088. Where should I keep my medicine? Infusions will be given in a hospital or clinic and will not be stored at home. Storage for syringes given under the skin and stored at home: Keep out of the reach of children. Store in a refrigerator between 2 and 8 degrees C (36 and 46 degrees F). Keep this medicine in the original container. Protect from light. Do not freeze. Throw away any unused medicine after the expiration date. NOTE: This sheet is a summary. It may not cover all possible information. If you have questions about this medicine, talk to your doctor, pharmacist, or health care provider.  2020 Elsevier/Gold Standard (2016-07-01 10:07:35)  

## 2019-10-04 ENCOUNTER — Other Ambulatory Visit: Payer: Self-pay

## 2019-10-05 ENCOUNTER — Inpatient Hospital Stay: Payer: Medicare Other | Attending: Hematology and Oncology

## 2019-10-05 VITALS — BP 136/73 | HR 53 | Temp 97.6°F | Resp 18

## 2019-10-05 DIAGNOSIS — M06041 Rheumatoid arthritis without rheumatoid factor, right hand: Secondary | ICD-10-CM

## 2019-10-05 DIAGNOSIS — M069 Rheumatoid arthritis, unspecified: Secondary | ICD-10-CM

## 2019-10-05 DIAGNOSIS — M06042 Rheumatoid arthritis without rheumatoid factor, left hand: Secondary | ICD-10-CM

## 2019-10-05 MED ORDER — SODIUM CHLORIDE 0.9 % IV SOLN
1000.0000 mg | INTRAVENOUS | Status: DC
  Administered 2019-10-05: 1000 mg via INTRAVENOUS
  Filled 2019-10-05: qty 40

## 2019-10-05 MED ORDER — SODIUM CHLORIDE 0.9 % IV SOLN
Freq: Once | INTRAVENOUS | Status: AC
  Administered 2019-10-05: 09:00:00 via INTRAVENOUS
  Filled 2019-10-05: qty 250

## 2019-10-05 NOTE — Patient Instructions (Signed)
Abatacept solution for injection (subcutaneous or intravenous use) What is this medicine? ABATACEPT (a ba TA sept) is used to treat moderate to severe active rheumatoid arthritis or psoriatic arthritis in adults. This medicine is also used to treat juvenile idiopathic arthritis. This medicine may be used for other purposes; ask your health care provider or pharmacist if you have questions. COMMON BRAND NAME(S): Orencia What should I tell my health care provider before I take this medicine? They need to know if you have any of these conditions:  are taking other medicines to treat rheumatoid arthritis  COPD  diabetes  infection or history of infections  recently received or scheduled to receive a vaccine  scheduled to have surgery  tuberculosis, a positive skin test for tuberculosis or have recently been in close contact with someone who has tuberculosis  viral hepatitis  an unusual or allergic reaction to abatacept, other medicines, foods, dyes, or preservatives  pregnant or trying to get pregnant  breast-feeding How should I use this medicine? This medicine is for infusion into a vein or for injection under the skin. Infusions are given by a health care professional in a hospital or clinic setting. If you are to give your own medicine at home, you will be taught how to prepare and give this medicine under the skin. Use exactly as directed. Take your medicine at regular intervals. Do not take your medicine more often than directed. It is important that you put your used needles and syringes in a special sharps container. Do not put them in a trash can. If you do not have a sharps container, call your pharmacist or healthcare provider to get one. Talk to your pediatrician regarding the use of this medicine in children. While infusions in a clinic may be prescribed for children as young as 2 years for selected conditions, precautions do apply. Overdosage: If you think you have taken  too much of this medicine contact a poison control center or emergency room at once. NOTE: This medicine is only for you. Do not share this medicine with others. What if I miss a dose? This medicine is used once a week if given by injection under the skin. If you miss a dose, take it as soon as you can. If it is almost time for your next dose, take only that dose. Do not take double or extra doses. If you are to be given an infusion, it is important not to miss your dose. Doses are usually every 4 weeks. Call your doctor or health care professional if you are unable to keep an appointment. What may interact with this medicine? Do not take this medicine with any of the following medications:  adalimumab  anakinra  certolizumab  etanercept  golimumab  infliximab  live virus vaccines  rituximab  tocilizumab This medicine may also interact with the following medications:  vaccines This list may not describe all possible interactions. Give your health care provider a list of all the medicines, herbs, non-prescription drugs, or dietary supplements you use. Also tell them if you smoke, drink alcohol, or use illegal drugs. Some items may interact with your medicine. What should I watch for while using this medicine? Visit your doctor for regular check ups while you are taking this medicine. Tell your doctor or healthcare professional if your symptoms do not start to get better or if they get worse. Call your doctor or health care professional if you get a cold or other infection while receiving this medicine.   Do not treat yourself. This medicine may decrease your body's ability to fight infection. Try to avoid being around people who are sick. What side effects may I notice from receiving this medicine? Side effects that you should report to your doctor or health care professional as soon as possible:  allergic reactions like skin rash, itching or hives, swelling of the face, lips, or  tongue  breathing problems  chest pain  signs of infection - fever or chills, cough, unusual tiredness, pain or trouble passing urine, or warm, red or painful skin Side effects that usually do not require medical attention (report to your doctor or health care professional if they continue or are bothersome):  dizziness  headache  nausea, vomiting  sore throat  stomach upset This list may not describe all possible side effects. Call your doctor for medical advice about side effects. You may report side effects to FDA at 1-800-FDA-1088. Where should I keep my medicine? Infusions will be given in a hospital or clinic and will not be stored at home. Storage for syringes given under the skin and stored at home: Keep out of the reach of children. Store in a refrigerator between 2 and 8 degrees C (36 and 46 degrees F). Keep this medicine in the original container. Protect from light. Do not freeze. Throw away any unused medicine after the expiration date. NOTE: This sheet is a summary. It may not cover all possible information. If you have questions about this medicine, talk to your doctor, pharmacist, or health care provider.  2020 Elsevier/Gold Standard (2016-07-01 10:07:35)  

## 2019-11-02 ENCOUNTER — Other Ambulatory Visit: Payer: Self-pay

## 2019-11-02 ENCOUNTER — Inpatient Hospital Stay: Payer: Medicare Other | Attending: Hematology and Oncology

## 2019-11-02 VITALS — BP 127/69 | HR 59 | Temp 97.8°F | Resp 18

## 2019-11-02 DIAGNOSIS — M069 Rheumatoid arthritis, unspecified: Secondary | ICD-10-CM | POA: Diagnosis not present

## 2019-11-02 DIAGNOSIS — M05749 Rheumatoid arthritis with rheumatoid factor of unspecified hand without organ or systems involvement: Secondary | ICD-10-CM

## 2019-11-02 DIAGNOSIS — M05741 Rheumatoid arthritis with rheumatoid factor of right hand without organ or systems involvement: Secondary | ICD-10-CM

## 2019-11-02 MED ORDER — SODIUM CHLORIDE 0.9 % IV SOLN
1000.0000 mg | INTRAVENOUS | Status: DC
  Administered 2019-11-02: 1000 mg via INTRAVENOUS
  Filled 2019-11-02: qty 40

## 2019-11-02 MED ORDER — SODIUM CHLORIDE 0.9 % IV SOLN
Freq: Once | INTRAVENOUS | Status: AC
  Administered 2019-11-02: 09:00:00 via INTRAVENOUS
  Filled 2019-11-02: qty 250

## 2019-11-02 NOTE — Patient Instructions (Signed)
Abatacept solution for injection (subcutaneous or intravenous use) What is this medicine? ABATACEPT (a ba TA sept) is used to treat moderate to severe active rheumatoid arthritis or psoriatic arthritis in adults. This medicine is also used to treat juvenile idiopathic arthritis. This medicine may be used for other purposes; ask your health care provider or pharmacist if you have questions. COMMON BRAND NAME(S): Orencia What should I tell my health care provider before I take this medicine? They need to know if you have any of these conditions:  are taking other medicines to treat rheumatoid arthritis  COPD  diabetes  infection or history of infections  recently received or scheduled to receive a vaccine  scheduled to have surgery  tuberculosis, a positive skin test for tuberculosis or have recently been in close contact with someone who has tuberculosis  viral hepatitis  an unusual or allergic reaction to abatacept, other medicines, foods, dyes, or preservatives  pregnant or trying to get pregnant  breast-feeding How should I use this medicine? This medicine is for infusion into a vein or for injection under the skin. Infusions are given by a health care professional in a hospital or clinic setting. If you are to give your own medicine at home, you will be taught how to prepare and give this medicine under the skin. Use exactly as directed. Take your medicine at regular intervals. Do not take your medicine more often than directed. It is important that you put your used needles and syringes in a special sharps container. Do not put them in a trash can. If you do not have a sharps container, call your pharmacist or healthcare provider to get one. Talk to your pediatrician regarding the use of this medicine in children. While infusions in a clinic may be prescribed for children as young as 2 years for selected conditions, precautions do apply. Overdosage: If you think you have taken  too much of this medicine contact a poison control center or emergency room at once. NOTE: This medicine is only for you. Do not share this medicine with others. What if I miss a dose? This medicine is used once a week if given by injection under the skin. If you miss a dose, take it as soon as you can. If it is almost time for your next dose, take only that dose. Do not take double or extra doses. If you are to be given an infusion, it is important not to miss your dose. Doses are usually every 4 weeks. Call your doctor or health care professional if you are unable to keep an appointment. What may interact with this medicine? Do not take this medicine with any of the following medications:  adalimumab  anakinra  certolizumab  etanercept  golimumab  infliximab  live virus vaccines  rituximab  tocilizumab This medicine may also interact with the following medications:  vaccines This list may not describe all possible interactions. Give your health care provider a list of all the medicines, herbs, non-prescription drugs, or dietary supplements you use. Also tell them if you smoke, drink alcohol, or use illegal drugs. Some items may interact with your medicine. What should I watch for while using this medicine? Visit your doctor for regular check ups while you are taking this medicine. Tell your doctor or healthcare professional if your symptoms do not start to get better or if they get worse. Call your doctor or health care professional if you get a cold or other infection while receiving this medicine.   Do not treat yourself. This medicine may decrease your body's ability to fight infection. Try to avoid being around people who are sick. What side effects may I notice from receiving this medicine? Side effects that you should report to your doctor or health care professional as soon as possible:  allergic reactions like skin rash, itching or hives, swelling of the face, lips, or  tongue  breathing problems  chest pain  signs of infection - fever or chills, cough, unusual tiredness, pain or trouble passing urine, or warm, red or painful skin Side effects that usually do not require medical attention (report to your doctor or health care professional if they continue or are bothersome):  dizziness  headache  nausea, vomiting  sore throat  stomach upset This list may not describe all possible side effects. Call your doctor for medical advice about side effects. You may report side effects to FDA at 1-800-FDA-1088. Where should I keep my medicine? Infusions will be given in a hospital or clinic and will not be stored at home. Storage for syringes given under the skin and stored at home: Keep out of the reach of children. Store in a refrigerator between 2 and 8 degrees C (36 and 46 degrees F). Keep this medicine in the original container. Protect from light. Do not freeze. Throw away any unused medicine after the expiration date. NOTE: This sheet is a summary. It may not cover all possible information. If you have questions about this medicine, talk to your doctor, pharmacist, or health care provider.  2020 Elsevier/Gold Standard (2016-07-01 10:07:35)  

## 2019-11-30 ENCOUNTER — Inpatient Hospital Stay: Payer: Medicare Other | Attending: Hematology and Oncology

## 2019-11-30 ENCOUNTER — Other Ambulatory Visit: Payer: Self-pay

## 2019-11-30 VITALS — BP 120/66 | HR 61 | Temp 98.7°F | Resp 18

## 2019-11-30 DIAGNOSIS — M069 Rheumatoid arthritis, unspecified: Secondary | ICD-10-CM

## 2019-11-30 DIAGNOSIS — M05741 Rheumatoid arthritis with rheumatoid factor of right hand without organ or systems involvement: Secondary | ICD-10-CM

## 2019-11-30 MED ORDER — SODIUM CHLORIDE 0.9 % IV SOLN
1000.0000 mg | INTRAVENOUS | Status: DC
  Administered 2019-11-30: 1000 mg via INTRAVENOUS
  Filled 2019-11-30: qty 40

## 2019-11-30 MED ORDER — SODIUM CHLORIDE 0.9 % IV SOLN
Freq: Once | INTRAVENOUS | Status: AC
  Administered 2019-11-30: 09:00:00 via INTRAVENOUS
  Filled 2019-11-30: qty 250

## 2019-11-30 NOTE — Patient Instructions (Signed)
Abatacept solution for injection (subcutaneous or intravenous use) What is this medicine? ABATACEPT (a ba TA sept) is used to treat moderate to severe active rheumatoid arthritis or psoriatic arthritis in adults. This medicine is also used to treat juvenile idiopathic arthritis. This medicine may be used for other purposes; ask your health care provider or pharmacist if you have questions. COMMON BRAND NAME(S): Orencia What should I tell my health care provider before I take this medicine? They need to know if you have any of these conditions:  are taking other medicines to treat rheumatoid arthritis  COPD  diabetes  infection or history of infections  recently received or scheduled to receive a vaccine  scheduled to have surgery  tuberculosis, a positive skin test for tuberculosis or have recently been in close contact with someone who has tuberculosis  viral hepatitis  an unusual or allergic reaction to abatacept, other medicines, foods, dyes, or preservatives  pregnant or trying to get pregnant  breast-feeding How should I use this medicine? This medicine is for infusion into a vein or for injection under the skin. Infusions are given by a health care professional in a hospital or clinic setting. If you are to give your own medicine at home, you will be taught how to prepare and give this medicine under the skin. Use exactly as directed. Take your medicine at regular intervals. Do not take your medicine more often than directed. It is important that you put your used needles and syringes in a special sharps container. Do not put them in a trash can. If you do not have a sharps container, call your pharmacist or healthcare provider to get one. Talk to your pediatrician regarding the use of this medicine in children. While infusions in a clinic may be prescribed for children as young as 2 years for selected conditions, precautions do apply. Overdosage: If you think you have taken  too much of this medicine contact a poison control center or emergency room at once. NOTE: This medicine is only for you. Do not share this medicine with others. What if I miss a dose? This medicine is used once a week if given by injection under the skin. If you miss a dose, take it as soon as you can. If it is almost time for your next dose, take only that dose. Do not take double or extra doses. If you are to be given an infusion, it is important not to miss your dose. Doses are usually every 4 weeks. Call your doctor or health care professional if you are unable to keep an appointment. What may interact with this medicine? Do not take this medicine with any of the following medications:  adalimumab  anakinra  certolizumab  etanercept  golimumab  infliximab  live virus vaccines  rituximab  tocilizumab This medicine may also interact with the following medications:  vaccines This list may not describe all possible interactions. Give your health care provider a list of all the medicines, herbs, non-prescription drugs, or dietary supplements you use. Also tell them if you smoke, drink alcohol, or use illegal drugs. Some items may interact with your medicine. What should I watch for while using this medicine? Visit your doctor for regular check ups while you are taking this medicine. Tell your doctor or healthcare professional if your symptoms do not start to get better or if they get worse. Call your doctor or health care professional if you get a cold or other infection while receiving this medicine.  Do not treat yourself. This medicine may decrease your body's ability to fight infection. Try to avoid being around people who are sick. What side effects may I notice from receiving this medicine? Side effects that you should report to your doctor or health care professional as soon as possible:  allergic reactions like skin rash, itching or hives, swelling of the face, lips, or  tongue  breathing problems  chest pain  signs of infection - fever or chills, cough, unusual tiredness, pain or trouble passing urine, or warm, red or painful skin Side effects that usually do not require medical attention (report to your doctor or health care professional if they continue or are bothersome):  dizziness  headache  nausea, vomiting  sore throat  stomach upset This list may not describe all possible side effects. Call your doctor for medical advice about side effects. You may report side effects to FDA at 1-800-FDA-1088. Where should I keep my medicine? Infusions will be given in a hospital or clinic and will not be stored at home. Storage for syringes given under the skin and stored at home: Keep out of the reach of children. Store in a refrigerator between 2 and 8 degrees C (36 and 46 degrees F). Keep this medicine in the original container. Protect from light. Do not freeze. Throw away any unused medicine after the expiration date. NOTE: This sheet is a summary. It may not cover all possible information. If you have questions about this medicine, talk to your doctor, pharmacist, or health care provider.  2020 Elsevier/Gold Standard (2016-07-01 10:07:35)

## 2020-01-03 ENCOUNTER — Other Ambulatory Visit: Payer: Self-pay

## 2020-01-04 ENCOUNTER — Inpatient Hospital Stay: Payer: Medicare Other | Attending: Hematology and Oncology

## 2020-01-04 VITALS — BP 144/72 | HR 58 | Temp 97.5°F | Resp 18

## 2020-01-04 DIAGNOSIS — M05742 Rheumatoid arthritis with rheumatoid factor of left hand without organ or systems involvement: Secondary | ICD-10-CM

## 2020-01-04 DIAGNOSIS — M05749 Rheumatoid arthritis with rheumatoid factor of unspecified hand without organ or systems involvement: Secondary | ICD-10-CM

## 2020-01-04 DIAGNOSIS — M069 Rheumatoid arthritis, unspecified: Secondary | ICD-10-CM | POA: Insufficient documentation

## 2020-01-04 DIAGNOSIS — M05741 Rheumatoid arthritis with rheumatoid factor of right hand without organ or systems involvement: Secondary | ICD-10-CM

## 2020-01-04 MED ORDER — SODIUM CHLORIDE 0.9 % IV SOLN
1000.0000 mg | INTRAVENOUS | Status: DC
  Administered 2020-01-04: 10:00:00 1000 mg via INTRAVENOUS
  Filled 2020-01-04: qty 40

## 2020-01-04 MED ORDER — SODIUM CHLORIDE 0.9 % IV SOLN
Freq: Once | INTRAVENOUS | Status: AC
  Filled 2020-01-04: qty 250

## 2020-01-04 NOTE — Patient Instructions (Signed)
Abatacept solution for injection (subcutaneous or intravenous use) What is this medicine? ABATACEPT (a ba TA sept) is used to treat moderate to severe active rheumatoid arthritis or psoriatic arthritis in adults. This medicine is also used to treat juvenile idiopathic arthritis. This medicine may be used for other purposes; ask your health care provider or pharmacist if you have questions. COMMON BRAND NAME(S): Orencia What should I tell my health care provider before I take this medicine? They need to know if you have any of these conditions:  cancer  diabetes  hepatitis B or history of hepatitis B infection  immune system problems  infection or history of infection (especially a virus infection such as chickenpox, cold sores, or herpes)  lung or breathing problems, like chronic obstructive pulmonary disease (COPD)  recently received or scheduled to receive a vaccination  scheduled to have surgery  tuberculosis, a positive skin test for tuberculosis, or have recently been in close contact with someone who has tuberculosis  an unusual or allergic reaction to abatacept, other medicines, foods, dyes, or preservatives  pregnant or trying to get pregnant  breast-feeding How should I use this medicine? This medicine is for infusion into a vein or for injection under the skin. Infusions are given by a health care professional in a hospital or clinic setting. If you are to give your own medicine at home, you will be taught how to prepare and give this medicine under the skin. Use exactly as directed. Take your medicine at regular intervals. Do not take your medicine more often than directed. It is important that you put your used needles and syringes in a special sharps container. Do not put them in a trash can. If you do not have a sharps container, call your pharmacist or health care provider to get one. Talk to your pediatrician regarding the use of this medicine in children. While  infusions in a clinic may be prescribed for children as young as 2 years for selected conditions, precautions do apply. Overdosage: If you think you have taken too much of this medicine contact a poison control center or emergency room at once. NOTE: This medicine is only for you. Do not share this medicine with others. What if I miss a dose? This medicine is used once a week if given by injection under the skin. If you miss a dose, take it as soon as you can. If it is almost time for your next dose, take only that dose. Do not take double or extra doses. If you are to be given an infusion of this medicine, it is important not to miss your dose. Doses are usually every 4 weeks. Call your doctor or health care professional if you are unable to keep an appointment. What may interact with this medicine? Do not take this medicine with any of the following medications:  live vaccines This medicine may also interact with the following medications:  anakinra  baricitinib  canakinumab  medicines that lower your chance of fighting an infection  rituximab  TNF blockers such as adalimumab, certolizumab, etanercept, golimumab, infliximab  tocilizumab  tofacitinib  upadacitinib  ustekinumab This list may not describe all possible interactions. Give your health care provider a list of all the medicines, herbs, non-prescription drugs, or dietary supplements you use. Also tell them if you smoke, drink alcohol, or use illegal drugs. Some items may interact with your medicine. What should I watch for while using this medicine? Visit your doctor for regular checks on your   progress. Tell your doctor or health care professional if your symptoms do not start to get better or if they get worse. You will be tested for tuberculosis (TB) before you start this medicine. If your doctor prescribed any medicine for TB, you should start taking the TB medicine before starting this medicine. Make sure to finish the  full course of TB medicine. This medicine may increase your risk of getting an infection. Call your doctor or health care professional if you get fever, chills, or sore throat, or other symptoms of a cold or flu. Do not treat yourself. Try to avoid being around people who are sick. If you have diabetes and are getting this medicine in a vein, the infusion can give false high blood sugar readings on the day of your dose. This may happen if you use certain types of blood glucose tests. Your health care provider may tell you to use a different way to monitor your blood sugar levels. What side effects may I notice from receiving this medicine? Side effects that you should report to your doctor or health care professional as soon as possible:  allergic reactions like skin rash, itching or hives, swelling of the face, lips, or tongue  breathing problems  chest pain  dizziness  signs and symptoms of infection like fever; chills; cough; sore throat; pain or trouble passing urine  unusually weak or tired Side effects that usually do not require medical attention (report to your doctor or health care professional if they continue or are bothersome):  diarrhea  headache  nausea  pain, redness, or irritation at site where injected  stomach pain or upset This list may not describe all possible side effects. Call your doctor for medical advice about side effects. You may report side effects to FDA at 1-800-FDA-1088. Where should I keep my medicine? Infusions will be given in a hospital or clinic and will not be stored at home. Storage for syringes and autoinjectors stored at home: Keep out of the reach of children. Store in a refrigerator between 2 and 8 degrees C (36 and 46 degrees F). Keep this medicine in the original container. Protect from light. Do not freeze. Do not shake. Throw away any unused medicine after the expiration date. NOTE: This sheet is a summary. It may not cover all possible  information. If you have questions about this medicine, talk to your doctor, pharmacist, or health care provider.  2020 Elsevier/Gold Standard (2019-06-19 14:01:21)  

## 2020-01-30 NOTE — Progress Notes (Signed)
[facility]  [address] [place], [place] [address] Phone: [phone]  Fax: [phone]   Clinic Day:  02/01/2020  Referring physician: Elaina Pattee, MD  Chief Complaint: [name] is a 74 y.o. male with rheumatoid arthritis who is seen for 6 month assessment and continuation of Orencia (abatacept).    HPI: The patient was last seen in the medical oncology clinic on 08/10/2019. At that time, he felt "fine".  He denied any concerns.  Exam was stable. He received Orencia.  He has received monthly Orencia 1000 mg (09/07/2019 - 01/04/2020).  He was seen in rheumatology by Dr Denman George on 01/16/2020.  He was noted to have no significant change in his disease control.  He continued to have some mild pain in his hands and feet.  Energy level was good.  He continues to work full-time.  Recommendation was to continue methotrexate 15 mg p.o. weekly, prednisone 5 mg p.o. daily, and Orencia 1000 mg IV every 4 weeks.  Patient states that his labs are drawn at [facility].  He  has a follow-up in 6 months.  During the interim, he has been "touch and go".  He has issues with regurgitating his food.  He denies any food is getting caught in his throat.  He had his esophagus stretched at [facility] on 01/29/2020 to help with his swallowing.  He has another EGD scheduled on 02/19/2020.  He has had some weight gain. He is drinking chocolate milk. He doesn't drink Boost on Ensure.   Regarding his rheumatoid arthritis, he states that he "lives with" it.  He believes his current treatment plan is helping.  He is able to go to work and perform all of his activities of daily living. 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   Past Medical History:  Diagnosis Date  . Aortic aneurysm ([place]) 05/2017  . Chronic back pain   . Diabetes mellitus without complication ([place])   . Dyslipidemia   . GERD (gastroesophageal reflux disease)   . H/O hiatal hernia   . Hypertension   .  Melanoma ([place])    "back; left arm"  . PONV (postoperative nausea and vomiting)   . Rheumatoid arthritis ([place])   . Spasm of esophagus     Past Surgical History:  Procedure Laterality Date  . aneursym repair    . aneursym repair and stent placement  05/2017  . CARPAL TUNNEL RELEASE Bilateral   . COLON SURGERY  2007   removed 18" of colon  . ERCP W/ METAL STENT PLACEMENT  11/2005   Archie Endo 12/14/2005  . ESOPHAGOGASTRODUODENOSCOPY (EGD) WITH ESOPHAGEAL DILATION     "several times'  . HAND SURGERY Bilateral    "plastic knuckles"  . LAPAROSCOPIC CHOLECYSTECTOMY  01/2006  . MELANOMA EXCISION     "back; left arm"  . NASAL SEPTUM SURGERY    . PELVIC FRACTURE SURGERY  1964   "busted in 2 places"  . RESECTION DISTAL CLAVICAL Right 05/13/2016   Procedure: DISTAL CLAVICLE EXCISION;  Surgeon: Ninetta Lights, MD;  Location: [place] [facility];  Service: Orthopedics;  Laterality: Right;  . SHOULDER ARTHROSCOPY WITH ROTATOR CUFF REPAIR AND SUBACROMIAL DECOMPRESSION Right 05/13/2016   Procedure: RIGHT SHOULDER SCOPE DEBRIDEMENT, ACROMIOPLASTY, ROTATOR CUFF REPAIR; RELEASE BICEPS TENDON AND DEBRIDEMENT LABRUM;  Surgeon: Ninetta Lights, MD;  Location: [facility];  Service: Orthopedics;  Laterality: Right;  . TIBIA FRACTURE SURGERY Left 2006   "put a steel rod  in it"    Family History  Problem Relation Age of Onset  . Heart disease Mother   . Heart disease Father   . Cancer Maternal Grandmother     Social History:  reports that he quit smoking about 14 years ago. His smoking use included cigarettes. He has a 46.00 pack-year smoking history. He has never used smokeless tobacco. He reports current alcohol use of about 9.0 standard drinks of alcohol per week. He reports that he does not use drugs. 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 The patient is alone today.  Allergies: No Known Allergies  Current Medications: Current  Outpatient Medications  Medication Sig Dispense Refill  . abatacept (ORENCIA) 250 MG injection Inject into the vein every 30 (thirty) days.    Marland Kitchen aspirin 325 MG tablet Take 325 mg by mouth daily.    Marland Kitchen atorvastatin (LIPITOR) 20 MG tablet Take 20 mg by mouth daily.    . fluticasone (FLONASE) 50 MCG/ACT nasal spray Place 2 sprays into the nose daily.    . folic acid (FOLVITE) 1 MG tablet Take 1 mg by mouth daily.    . irbesartan (AVAPRO) 150 MG tablet Take 150 mg by mouth daily.     . magnesium oxide (MAG-OX) 400 MG tablet Take 800 mg by mouth 3 (three) times daily.    . metFORMIN (GLUCOPHAGE) 500 MG tablet Take 500 mg by mouth daily with breakfast.    . methotrexate (RHEUMATREX) 2.5 MG tablet Take 2.5 mg by mouth once a week.    . Multiple Vitamin (MULTI-VITAMIN) tablet Take by mouth.    Marland Kitchen omeprazole (PRILOSEC) 20 MG capsule Take 20 mg by mouth daily.     Marland Kitchen oxyCODONE-acetaminophen (ROXICET) 5-325 MG tablet Take 1-2 tablets by mouth every 4 (four) hours as needed. 60 tablet 0  . potassium chloride (K-DUR) 10 MEQ tablet Take 10 mEq by mouth daily.     . predniSONE (DELTASONE) 5 MG tablet Take 5 mg by mouth daily with breakfast.    . tadalafil (CIALIS) 5 MG tablet Take 5 mg by mouth daily.    Marland Kitchen tretinoin (RETIN-A) 0.05 % cream Apply topically.     No current facility-administered medications for this visit.   Facility-Administered Medications Ordered in Other Visits  Medication Dose Route Frequency Provider Last Rate Last Admin  . 0.9 %  sodium chloride infusion   Intravenous Once Lloyd Huger, MD      . 0.9 %  sodium chloride infusion   Intravenous Once Lloyd Huger, MD      . abatacept [facility]) 1,000 mg in sodium chloride 0.9 % 100 mL IVPB  1,000 mg Intravenous Q28 days Lloyd Huger, MD      . abatacept [facility]) 1,000 mg in sodium chloride 0.9 % 100 mL IVPB  1,000 mg Intravenous Q28 days Lloyd Huger, MD      . abatacept [facility]) 1,000 mg in sodium chloride 0.9 %  100 mL IVPB  1,000 mg Intravenous Q28 days Lloyd Huger, MD   Stopped at 02/23/19 1340    Review of Systems  Constitutional: Negative.  Negative for chills, diaphoresis, fever, malaise/fatigue and weight loss (up 6 lbs).       Feels "good".  HENT: Negative.  Negative for congestion, hearing loss, sinus pain and sore throat.        Tongue and teeth issues s/p radiation.  Eyes: Negative.  Negative for blurred vision and double vision.  Respiratory: Negative.  Negative for cough, shortness of breath and wheezing.   Cardiovascular: Negative.  Negative for chest pain, palpitations, claudication, leg swelling and PND.  Gastrointestinal: Negative.  Negative for abdominal pain, blood in stool, constipation, diarrhea, melena, nausea and vomiting.       Esophageal stricture s/p dilatation.  Genitourinary: Negative.  Negative for dysuria, frequency, hematuria and urgency.  Musculoskeletal: Positive for joint pain (rhuematoid arthritis). Negative for back pain and myalgias.  Skin: Negative.  Negative for rash.  Neurological: Negative.  Negative for dizziness, tingling, sensory change, focal weakness and weakness.  Endo/Heme/Allergies: Bruises/bleeds easily.       Diabetes.  Psychiatric/Behavioral: Negative.  Negative for depression, memory loss and substance abuse. The patient is not nervous/anxious and does not have insomnia.   All other systems reviewed and are negative.  Performance status (ECOG):  1  Vitals Blood pressure (!) 163/72, pulse 89, temperature 98.6 F (37 C), temperature source Tympanic, resp. rate 18, height 6\' 2"  (1.88 m), weight 196 lb 3.4 oz (89 kg), SpO2 99 %.   Physical Exam  Constitutional: He is oriented to person, place, and time. He appears well-developed and well-nourished. No distress.  HENT:  Head: Normocephalic and atraumatic.  Mouth/Throat: Oropharynx is clear and moist. No oropharyngeal exudate.  Black hair with graying.  Partial metal plate.  Mask.  Eyes:  Pupils are equal, round, and reactive to light. Conjunctivae and EOM are normal. No scleral icterus.  Blue eyes.  Neck: No JVD present.  Cardiovascular: Normal rate, regular rhythm and normal heart sounds.  No murmur heard. Pulmonary/Chest: Effort normal and breath sounds normal. No respiratory distress. He has no wheezes. He has no rales.  Abdominal: Soft. Bowel sounds are normal. He exhibits no distension and no mass. There is no abdominal tenderness. There is no rebound and no guarding.  Musculoskeletal:        General: No edema. Normal range of motion.     Cervical back: Normal range of motion and neck supple.     Comments: Bilateral ulnar deviation.  Lymphadenopathy:       Head (right side): No preauricular, no posterior auricular and no occipital adenopathy present.       Head (left side): No preauricular, no posterior auricular and no occipital adenopathy present.    He has no cervical adenopathy.    He has no axillary adenopathy.       Right: No inguinal and no supraclavicular adenopathy present.       Left: No inguinal and no supraclavicular adenopathy present.  Neurological: He is alert and oriented to person, place, and time.  Skin: Skin is warm, dry and intact. No rash noted. He is not diaphoretic. No erythema. No pallor.  Psychiatric: He has a normal mood and affect. His behavior is normal. Judgment and thought content normal.  Nursing note and vitals reviewed.   No visits with results within 3 Day(s) from this visit.  Latest known visit with results is:  Infusion on 02/23/2019  Component Date Value Ref Range Status  . C Diff antigen 02/23/2019 NEGATIVE  NEGATIVE Final  . C Diff toxin 02/23/2019 NEGATIVE  NEGATIVE Final  . C Diff interpretation 02/23/2019 No C. difficile detected.   Final   Performed at [facility], [address] [place], [place] [address]    Assessment:  [name] is a 74 y.o. male with rheumatoid arthritis.He was diagnosed  in 85. He was initially treated with Remicade.  He receives [employer].  He received his first infusion on 07/25/2015 (last 01/04/2020).  He is also on methotrexate 15 mg p.o. weekly and prednisone 5 mg p.o. 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 He was treated with chemotherapy and radiation at [facility]. Treatment completed on 05/05/2015.  Symptomatically, he is doing well.  Arthritis is well controlled.  Exam is stable.  Plan: 1.   Rheumatoid arthritis Patient continues to do well.             He is tolerating Orencia well.             Review interval assessment by Dr Remer Macho.    Plan to continue Orencia (abatacept) monthly.   Labs drawn at [facility]. 2.   Orencia today and monthly x 5.  3.   RTC in 6 months for MD assessment and Orencia.  I discussed the assessment and treatment plan with the patient.  The patient was provided an opportunity to ask questions and all were answered.  The patient agreed with the plan and demonstrated an understanding of the instructions.  The patient was advised to call back if the symptoms worsen or if the condition fails to improve as anticipated.   Lequita Asal, MD, PhD    02/01/2020, 9:29 AM  I, Samul Dada, am acting as a scribe for Lequita Asal, MD.  I, [place] Mike Gip, MD, have reviewed the above documentation for accuracy and completeness, and I agree with the above.

## 2020-02-01 ENCOUNTER — Encounter: Payer: Self-pay | Admitting: Hematology and Oncology

## 2020-02-01 ENCOUNTER — Inpatient Hospital Stay: Payer: Medicare Other

## 2020-02-01 ENCOUNTER — Inpatient Hospital Stay: Payer: Medicare Other | Attending: Hematology and Oncology | Admitting: Hematology and Oncology

## 2020-02-01 ENCOUNTER — Other Ambulatory Visit: Payer: Self-pay

## 2020-02-01 VITALS — BP 163/72 | HR 89 | Temp 98.6°F | Resp 18 | Ht 74.0 in | Wt 196.2 lb

## 2020-02-01 VITALS — BP 151/79 | HR 64 | Resp 18

## 2020-02-01 DIAGNOSIS — Z5111 Encounter for antineoplastic chemotherapy: Secondary | ICD-10-CM | POA: Insufficient documentation

## 2020-02-01 DIAGNOSIS — Z87891 Personal history of nicotine dependence: Secondary | ICD-10-CM | POA: Insufficient documentation

## 2020-02-01 DIAGNOSIS — Z7952 Long term (current) use of systemic steroids: Secondary | ICD-10-CM | POA: Insufficient documentation

## 2020-02-01 DIAGNOSIS — Z85820 Personal history of malignant melanoma of skin: Secondary | ICD-10-CM | POA: Diagnosis not present

## 2020-02-01 DIAGNOSIS — M0579 Rheumatoid arthritis with rheumatoid factor of multiple sites without organ or systems involvement: Secondary | ICD-10-CM | POA: Diagnosis not present

## 2020-02-01 DIAGNOSIS — E785 Hyperlipidemia, unspecified: Secondary | ICD-10-CM | POA: Insufficient documentation

## 2020-02-01 DIAGNOSIS — Z85810 Personal history of malignant neoplasm of tongue: Secondary | ICD-10-CM | POA: Insufficient documentation

## 2020-02-01 DIAGNOSIS — Z7982 Long term (current) use of aspirin: Secondary | ICD-10-CM | POA: Diagnosis not present

## 2020-02-01 DIAGNOSIS — I10 Essential (primary) hypertension: Secondary | ICD-10-CM | POA: Insufficient documentation

## 2020-02-01 DIAGNOSIS — Z923 Personal history of irradiation: Secondary | ICD-10-CM | POA: Diagnosis not present

## 2020-02-01 DIAGNOSIS — M069 Rheumatoid arthritis, unspecified: Secondary | ICD-10-CM | POA: Diagnosis present

## 2020-02-01 DIAGNOSIS — Z8249 Family history of ischemic heart disease and other diseases of the circulatory system: Secondary | ICD-10-CM | POA: Insufficient documentation

## 2020-02-01 DIAGNOSIS — Z7984 Long term (current) use of oral hypoglycemic drugs: Secondary | ICD-10-CM | POA: Insufficient documentation

## 2020-02-01 DIAGNOSIS — E119 Type 2 diabetes mellitus without complications: Secondary | ICD-10-CM | POA: Insufficient documentation

## 2020-02-01 DIAGNOSIS — Z809 Family history of malignant neoplasm, unspecified: Secondary | ICD-10-CM | POA: Insufficient documentation

## 2020-02-01 DIAGNOSIS — Z79899 Other long term (current) drug therapy: Secondary | ICD-10-CM | POA: Insufficient documentation

## 2020-02-01 DIAGNOSIS — Z9221 Personal history of antineoplastic chemotherapy: Secondary | ICD-10-CM | POA: Insufficient documentation

## 2020-02-01 MED ORDER — SODIUM CHLORIDE 0.9 % IV SOLN
Freq: Once | INTRAVENOUS | Status: AC
  Filled 2020-02-01: qty 250

## 2020-02-01 MED ORDER — SODIUM CHLORIDE 0.9 % IV SOLN
1000.0000 mg | INTRAVENOUS | Status: DC
  Administered 2020-02-01: 1000 mg via INTRAVENOUS
  Filled 2020-02-01: qty 40

## 2020-02-01 NOTE — Progress Notes (Signed)
No new changes noted today 

## 2020-02-01 NOTE — Patient Instructions (Signed)
Abatacept solution for injection (subcutaneous or intravenous use) What is this medicine? ABATACEPT (a ba TA sept) is used to treat moderate to severe active rheumatoid arthritis or psoriatic arthritis in adults. This medicine is also used to treat juvenile idiopathic arthritis. This medicine may be used for other purposes; ask your health care provider or pharmacist if you have questions. COMMON BRAND NAME(S): Orencia What should I tell my health care provider before I take this medicine? They need to know if you have any of these conditions:  cancer  diabetes  hepatitis B or history of hepatitis B infection  immune system problems  infection or history of infection (especially a virus infection such as chickenpox, cold sores, or herpes)  lung or breathing problems, like chronic obstructive pulmonary disease (COPD)  recently received or scheduled to receive a vaccination  scheduled to have surgery  tuberculosis, a positive skin test for tuberculosis, or have recently been in close contact with someone who has tuberculosis  an unusual or allergic reaction to abatacept, other medicines, foods, dyes, or preservatives  pregnant or trying to get pregnant  breast-feeding How should I use this medicine? This medicine is for infusion into a vein or for injection under the skin. Infusions are given by a health care professional in a hospital or clinic setting. If you are to give your own medicine at home, you will be taught how to prepare and give this medicine under the skin. Use exactly as directed. Take your medicine at regular intervals. Do not take your medicine more often than directed. It is important that you put your used needles and syringes in a special sharps container. Do not put them in a trash can. If you do not have a sharps container, call your pharmacist or health care provider to get one. Talk to your pediatrician regarding the use of this medicine in children. While  infusions in a clinic may be prescribed for children as young as 2 years for selected conditions, precautions do apply. Overdosage: If you think you have taken too much of this medicine contact a poison control center or emergency room at once. NOTE: This medicine is only for you. Do not share this medicine with others. What if I miss a dose? This medicine is used once a week if given by injection under the skin. If you miss a dose, take it as soon as you can. If it is almost time for your next dose, take only that dose. Do not take double or extra doses. If you are to be given an infusion of this medicine, it is important not to miss your dose. Doses are usually every 4 weeks. Call your doctor or health care professional if you are unable to keep an appointment. What may interact with this medicine? Do not take this medicine with any of the following medications:  live vaccines This medicine may also interact with the following medications:  anakinra  baricitinib  canakinumab  medicines that lower your chance of fighting an infection  rituximab  TNF blockers such as adalimumab, certolizumab, etanercept, golimumab, infliximab  tocilizumab  tofacitinib  upadacitinib  ustekinumab This list may not describe all possible interactions. Give your health care provider a list of all the medicines, herbs, non-prescription drugs, or dietary supplements you use. Also tell them if you smoke, drink alcohol, or use illegal drugs. Some items may interact with your medicine. What should I watch for while using this medicine? Visit your doctor for regular checks on your   progress. Tell your doctor or health care professional if your symptoms do not start to get better or if they get worse. You will be tested for tuberculosis (TB) before you start this medicine. If your doctor prescribed any medicine for TB, you should start taking the TB medicine before starting this medicine. Make sure to finish the  full course of TB medicine. This medicine may increase your risk of getting an infection. Call your doctor or health care professional if you get fever, chills, or sore throat, or other symptoms of a cold or flu. Do not treat yourself. Try to avoid being around people who are sick. If you have diabetes and are getting this medicine in a vein, the infusion can give false high blood sugar readings on the day of your dose. This may happen if you use certain types of blood glucose tests. Your health care provider may tell you to use a different way to monitor your blood sugar levels. What side effects may I notice from receiving this medicine? Side effects that you should report to your doctor or health care professional as soon as possible:  allergic reactions like skin rash, itching or hives, swelling of the face, lips, or tongue  breathing problems  chest pain  dizziness  signs and symptoms of infection like fever; chills; cough; sore throat; pain or trouble passing urine  unusually weak or tired Side effects that usually do not require medical attention (report to your doctor or health care professional if they continue or are bothersome):  diarrhea  headache  nausea  pain, redness, or irritation at site where injected  stomach pain or upset This list may not describe all possible side effects. Call your doctor for medical advice about side effects. You may report side effects to FDA at 1-800-FDA-1088. Where should I keep my medicine? Infusions will be given in a hospital or clinic and will not be stored at home. Storage for syringes and autoinjectors stored at home: Keep out of the reach of children. Store in a refrigerator between 2 and 8 degrees C (36 and 46 degrees F). Keep this medicine in the original container. Protect from light. Do not freeze. Do not shake. Throw away any unused medicine after the expiration date. NOTE: This sheet is a summary. It may not cover all possible  information. If you have questions about this medicine, talk to your doctor, pharmacist, or health care provider.  2020 Elsevier/Gold Standard (2019-06-19 14:01:21)  

## 2020-02-29 ENCOUNTER — Inpatient Hospital Stay: Payer: Medicare Other | Attending: Hematology and Oncology

## 2020-02-29 ENCOUNTER — Other Ambulatory Visit: Payer: Self-pay

## 2020-02-29 VITALS — BP 133/70 | HR 61 | Temp 97.2°F | Resp 18

## 2020-02-29 DIAGNOSIS — M0579 Rheumatoid arthritis with rheumatoid factor of multiple sites without organ or systems involvement: Secondary | ICD-10-CM | POA: Diagnosis not present

## 2020-02-29 DIAGNOSIS — M069 Rheumatoid arthritis, unspecified: Secondary | ICD-10-CM

## 2020-02-29 MED ORDER — SODIUM CHLORIDE 0.9 % IV SOLN
1000.0000 mg | INTRAVENOUS | Status: DC
  Administered 2020-02-29: 1000 mg via INTRAVENOUS
  Filled 2020-02-29: qty 40

## 2020-02-29 MED ORDER — SODIUM CHLORIDE 0.9 % IV SOLN
Freq: Once | INTRAVENOUS | Status: AC
  Filled 2020-02-29: qty 250

## 2020-02-29 NOTE — Patient Instructions (Signed)
Abatacept solution for injection (subcutaneous or intravenous use) What is this medicine? ABATACEPT (a ba TA sept) is used to treat moderate to severe active rheumatoid arthritis or psoriatic arthritis in adults. This medicine is also used to treat juvenile idiopathic arthritis. This medicine may be used for other purposes; ask your health care provider or pharmacist if you have questions. COMMON BRAND NAME(S): Orencia What should I tell my health care provider before I take this medicine? They need to know if you have any of these conditions:  cancer  diabetes  hepatitis B or history of hepatitis B infection  immune system problems  infection or history of infection (especially a virus infection such as chickenpox, cold sores, or herpes)  lung or breathing problems, like chronic obstructive pulmonary disease (COPD)  recently received or scheduled to receive a vaccination  scheduled to have surgery  tuberculosis, a positive skin test for tuberculosis, or have recently been in close contact with someone who has tuberculosis  an unusual or allergic reaction to abatacept, other medicines, foods, dyes, or preservatives  pregnant or trying to get pregnant  breast-feeding How should I use this medicine? This medicine is for infusion into a vein or for injection under the skin. Infusions are given by a health care professional in a hospital or clinic setting. If you are to give your own medicine at home, you will be taught how to prepare and give this medicine under the skin. Use exactly as directed. Take your medicine at regular intervals. Do not take your medicine more often than directed. It is important that you put your used needles and syringes in a special sharps container. Do not put them in a trash can. If you do not have a sharps container, call your pharmacist or health care provider to get one. Talk to your pediatrician regarding the use of this medicine in children. While  infusions in a clinic may be prescribed for children as young as 2 years for selected conditions, precautions do apply. Overdosage: If you think you have taken too much of this medicine contact a poison control center or emergency room at once. NOTE: This medicine is only for you. Do not share this medicine with others. What if I miss a dose? This medicine is used once a week if given by injection under the skin. If you miss a dose, take it as soon as you can. If it is almost time for your next dose, take only that dose. Do not take double or extra doses. If you are to be given an infusion of this medicine, it is important not to miss your dose. Doses are usually every 4 weeks. Call your doctor or health care professional if you are unable to keep an appointment. What may interact with this medicine? Do not take this medicine with any of the following medications:  live vaccines This medicine may also interact with the following medications:  anakinra  baricitinib  canakinumab  medicines that lower your chance of fighting an infection  rituximab  TNF blockers such as adalimumab, certolizumab, etanercept, golimumab, infliximab  tocilizumab  tofacitinib  upadacitinib  ustekinumab This list may not describe all possible interactions. Give your health care provider a list of all the medicines, herbs, non-prescription drugs, or dietary supplements you use. Also tell them if you smoke, drink alcohol, or use illegal drugs. Some items may interact with your medicine. What should I watch for while using this medicine? Visit your doctor for regular checks on your   progress. Tell your doctor or health care professional if your symptoms do not start to get better or if they get worse. You will be tested for tuberculosis (TB) before you start this medicine. If your doctor prescribed any medicine for TB, you should start taking the TB medicine before starting this medicine. Make sure to finish the  full course of TB medicine. This medicine may increase your risk of getting an infection. Call your doctor or health care professional if you get fever, chills, or sore throat, or other symptoms of a cold or flu. Do not treat yourself. Try to avoid being around people who are sick. If you have diabetes and are getting this medicine in a vein, the infusion can give false high blood sugar readings on the day of your dose. This may happen if you use certain types of blood glucose tests. Your health care provider may tell you to use a different way to monitor your blood sugar levels. What side effects may I notice from receiving this medicine? Side effects that you should report to your doctor or health care professional as soon as possible:  allergic reactions like skin rash, itching or hives, swelling of the face, lips, or tongue  breathing problems  chest pain  dizziness  signs and symptoms of infection like fever; chills; cough; sore throat; pain or trouble passing urine  unusually weak or tired Side effects that usually do not require medical attention (report to your doctor or health care professional if they continue or are bothersome):  diarrhea  headache  nausea  pain, redness, or irritation at site where injected  stomach pain or upset This list may not describe all possible side effects. Call your doctor for medical advice about side effects. You may report side effects to FDA at 1-800-FDA-1088. Where should I keep my medicine? Infusions will be given in a hospital or clinic and will not be stored at home. Storage for syringes and autoinjectors stored at home: Keep out of the reach of children. Store in a refrigerator between 2 and 8 degrees C (36 and 46 degrees F). Keep this medicine in the original container. Protect from light. Do not freeze. Do not shake. Throw away any unused medicine after the expiration date. NOTE: This sheet is a summary. It may not cover all possible  information. If you have questions about this medicine, talk to your doctor, pharmacist, or health care provider.  2020 Elsevier/Gold Standard (2019-06-19 14:01:21)  

## 2020-03-21 NOTE — Progress Notes (Signed)
Pharmacist Chemotherapy Monitoring - Follow Up Assessment    I verify that I have reviewed each item in the below checklist:  . Regimen for the patient is scheduled for the appropriate day and plan matches scheduled date. Marland Kitchen Appropriate non-routine labs are ordered dependent on drug ordered. . If applicable, additional medications reviewed and ordered per protocol based on lifetime cumulative doses and/or treatment regimen.   Plan for follow-up and/or issues identified: No . I-vent associated with next due treatment: No . MD and/or nursing notified: No  [name] K 03/21/2020 8:29 AM

## 2020-03-28 ENCOUNTER — Inpatient Hospital Stay: Payer: Medicare Other | Attending: Hematology and Oncology

## 2020-03-28 ENCOUNTER — Other Ambulatory Visit: Payer: Self-pay

## 2020-03-28 VITALS — BP 124/65 | HR 62 | Temp 97.0°F | Resp 20

## 2020-03-28 DIAGNOSIS — M05749 Rheumatoid arthritis with rheumatoid factor of unspecified hand without organ or systems involvement: Secondary | ICD-10-CM

## 2020-03-28 DIAGNOSIS — M0579 Rheumatoid arthritis with rheumatoid factor of multiple sites without organ or systems involvement: Secondary | ICD-10-CM | POA: Diagnosis not present

## 2020-03-28 DIAGNOSIS — M069 Rheumatoid arthritis, unspecified: Secondary | ICD-10-CM

## 2020-03-28 MED ORDER — SODIUM CHLORIDE 0.9 % IV SOLN
Freq: Once | INTRAVENOUS | Status: AC
  Filled 2020-03-28: qty 250

## 2020-03-28 MED ORDER — SODIUM CHLORIDE 0.9 % IV SOLN
1000.0000 mg | INTRAVENOUS | Status: DC
  Administered 2020-03-28: 1000 mg via INTRAVENOUS
  Filled 2020-03-28: qty 40

## 2020-03-28 NOTE — Patient Instructions (Signed)
Abatacept solution for injection (subcutaneous or intravenous use) What is this medicine? ABATACEPT (a ba TA sept) is used to treat moderate to severe active rheumatoid arthritis or psoriatic arthritis in adults. This medicine is also used to treat juvenile idiopathic arthritis. This medicine may be used for other purposes; ask your health care provider or pharmacist if you have questions. COMMON BRAND NAME(S): Orencia What should I tell my health care provider before I take this medicine? They need to know if you have any of these conditions:  cancer  diabetes  hepatitis B or history of hepatitis B infection  immune system problems  infection or history of infection (especially a virus infection such as chickenpox, cold sores, or herpes)  lung or breathing problems, like chronic obstructive pulmonary disease (COPD)  recently received or scheduled to receive a vaccination  scheduled to have surgery  tuberculosis, a positive skin test for tuberculosis, or have recently been in close contact with someone who has tuberculosis  an unusual or allergic reaction to abatacept, other medicines, foods, dyes, or preservatives  pregnant or trying to get pregnant  breast-feeding How should I use this medicine? This medicine is for infusion into a vein or for injection under the skin. Infusions are given by a health care professional in a hospital or clinic setting. If you are to give your own medicine at home, you will be taught how to prepare and give this medicine under the skin. Use exactly as directed. Take your medicine at regular intervals. Do not take your medicine more often than directed. It is important that you put your used needles and syringes in a special sharps container. Do not put them in a trash can. If you do not have a sharps container, call your pharmacist or health care provider to get one. Talk to your pediatrician regarding the use of this medicine in children. While  infusions in a clinic may be prescribed for children as young as 2 years for selected conditions, precautions do apply. Overdosage: If you think you have taken too much of this medicine contact a poison control center or emergency room at once. NOTE: This medicine is only for you. Do not share this medicine with others. What if I miss a dose? This medicine is used once a week if given by injection under the skin. If you miss a dose, take it as soon as you can. If it is almost time for your next dose, take only that dose. Do not take double or extra doses. If you are to be given an infusion of this medicine, it is important not to miss your dose. Doses are usually every 4 weeks. Call your doctor or health care professional if you are unable to keep an appointment. What may interact with this medicine? Do not take this medicine with any of the following medications:  live vaccines This medicine may also interact with the following medications:  anakinra  baricitinib  canakinumab  medicines that lower your chance of fighting an infection  rituximab  TNF blockers such as adalimumab, certolizumab, etanercept, golimumab, infliximab  tocilizumab  tofacitinib  upadacitinib  ustekinumab This list may not describe all possible interactions. Give your health care provider a list of all the medicines, herbs, non-prescription drugs, or dietary supplements you use. Also tell them if you smoke, drink alcohol, or use illegal drugs. Some items may interact with your medicine. What should I watch for while using this medicine? Visit your doctor for regular checks on your   progress. Tell your doctor or health care professional if your symptoms do not start to get better or if they get worse. You will be tested for tuberculosis (TB) before you start this medicine. If your doctor prescribed any medicine for TB, you should start taking the TB medicine before starting this medicine. Make sure to finish the  full course of TB medicine. This medicine may increase your risk of getting an infection. Call your doctor or health care professional if you get fever, chills, or sore throat, or other symptoms of a cold or flu. Do not treat yourself. Try to avoid being around people who are sick. If you have diabetes and are getting this medicine in a vein, the infusion can give false high blood sugar readings on the day of your dose. This may happen if you use certain types of blood glucose tests. Your health care provider may tell you to use a different way to monitor your blood sugar levels. What side effects may I notice from receiving this medicine? Side effects that you should report to your doctor or health care professional as soon as possible:  allergic reactions like skin rash, itching or hives, swelling of the face, lips, or tongue  breathing problems  chest pain  dizziness  signs and symptoms of infection like fever; chills; cough; sore throat; pain or trouble passing urine  unusually weak or tired Side effects that usually do not require medical attention (report to your doctor or health care professional if they continue or are bothersome):  diarrhea  headache  nausea  pain, redness, or irritation at site where injected  stomach pain or upset This list may not describe all possible side effects. Call your doctor for medical advice about side effects. You may report side effects to FDA at 1-800-FDA-1088. Where should I keep my medicine? Infusions will be given in a hospital or clinic and will not be stored at home. Storage for syringes and autoinjectors stored at home: Keep out of the reach of children. Store in a refrigerator between 2 and 8 degrees C (36 and 46 degrees F). Keep this medicine in the original container. Protect from light. Do not freeze. Do not shake. Throw away any unused medicine after the expiration date. NOTE: This sheet is a summary. It may not cover all possible  information. If you have questions about this medicine, talk to your doctor, pharmacist, or health care provider.  2020 Elsevier/Gold Standard (2019-06-19 14:01:21)  

## 2020-04-25 ENCOUNTER — Inpatient Hospital Stay: Payer: Medicare Other

## 2020-05-02 ENCOUNTER — Other Ambulatory Visit: Payer: Self-pay

## 2020-05-02 ENCOUNTER — Inpatient Hospital Stay: Payer: Medicare Other | Attending: Hematology and Oncology

## 2020-05-02 VITALS — BP 108/65 | HR 60 | Temp 97.1°F | Resp 20

## 2020-05-02 DIAGNOSIS — M069 Rheumatoid arthritis, unspecified: Secondary | ICD-10-CM | POA: Insufficient documentation

## 2020-05-02 DIAGNOSIS — M0579 Rheumatoid arthritis with rheumatoid factor of multiple sites without organ or systems involvement: Secondary | ICD-10-CM

## 2020-05-02 MED ORDER — SODIUM CHLORIDE 0.9 % IV SOLN
1000.0000 mg | INTRAVENOUS | Status: DC
  Administered 2020-05-02: 1000 mg via INTRAVENOUS
  Filled 2020-05-02: qty 40

## 2020-05-02 MED ORDER — SODIUM CHLORIDE 0.9 % IV SOLN
Freq: Once | INTRAVENOUS | Status: AC
  Filled 2020-05-02: qty 250

## 2020-05-02 NOTE — Patient Instructions (Signed)
Abatacept solution for injection (subcutaneous or intravenous use) What is this medicine? ABATACEPT (a ba TA sept) is used to treat moderate to severe active rheumatoid arthritis or psoriatic arthritis in adults. This medicine is also used to treat juvenile idiopathic arthritis. This medicine may be used for other purposes; ask your health care provider or pharmacist if you have questions. COMMON BRAND NAME(S): Orencia What should I tell my health care provider before I take this medicine? They need to know if you have any of these conditions:  cancer  diabetes  hepatitis B or history of hepatitis B infection  immune system problems  infection or history of infection (especially a virus infection such as chickenpox, cold sores, or herpes)  lung or breathing problems, like chronic obstructive pulmonary disease (COPD)  recently received or scheduled to receive a vaccination  scheduled to have surgery  tuberculosis, a positive skin test for tuberculosis, or have recently been in close contact with someone who has tuberculosis  an unusual or allergic reaction to abatacept, other medicines, foods, dyes, or preservatives  pregnant or trying to get pregnant  breast-feeding How should I use this medicine? This medicine is for infusion into a vein or for injection under the skin. Infusions are given by a health care professional in a hospital or clinic setting. If you are to give your own medicine at home, you will be taught how to prepare and give this medicine under the skin. Use exactly as directed. Take your medicine at regular intervals. Do not take your medicine more often than directed. It is important that you put your used needles and syringes in a special sharps container. Do not put them in a trash can. If you do not have a sharps container, call your pharmacist or health care provider to get one. Talk to your pediatrician regarding the use of this medicine in children. While  infusions in a clinic may be prescribed for children as young as 2 years for selected conditions, precautions do apply. Overdosage: If you think you have taken too much of this medicine contact a poison control center or emergency room at once. NOTE: This medicine is only for you. Do not share this medicine with others. What if I miss a dose? This medicine is used once a week if given by injection under the skin. If you miss a dose, take it as soon as you can. If it is almost time for your next dose, take only that dose. Do not take double or extra doses. If you are to be given an infusion of this medicine, it is important not to miss your dose. Doses are usually every 4 weeks. Call your doctor or health care professional if you are unable to keep an appointment. What may interact with this medicine? Do not take this medicine with any of the following medications:  live vaccines This medicine may also interact with the following medications:  anakinra  baricitinib  canakinumab  medicines that lower your chance of fighting an infection  rituximab  TNF blockers such as adalimumab, certolizumab, etanercept, golimumab, infliximab  tocilizumab  tofacitinib  upadacitinib  ustekinumab This list may not describe all possible interactions. Give your health care provider a list of all the medicines, herbs, non-prescription drugs, or dietary supplements you use. Also tell them if you smoke, drink alcohol, or use illegal drugs. Some items may interact with your medicine. What should I watch for while using this medicine? Visit your doctor for regular checks on your   progress. Tell your doctor or health care professional if your symptoms do not start to get better or if they get worse. You will be tested for tuberculosis (TB) before you start this medicine. If your doctor prescribed any medicine for TB, you should start taking the TB medicine before starting this medicine. Make sure to finish the  full course of TB medicine. This medicine may increase your risk of getting an infection. Call your doctor or health care professional if you get fever, chills, or sore throat, or other symptoms of a cold or flu. Do not treat yourself. Try to avoid being around people who are sick. If you have diabetes and are getting this medicine in a vein, the infusion can give false high blood sugar readings on the day of your dose. This may happen if you use certain types of blood glucose tests. Your health care provider may tell you to use a different way to monitor your blood sugar levels. What side effects may I notice from receiving this medicine? Side effects that you should report to your doctor or health care professional as soon as possible:  allergic reactions like skin rash, itching or hives, swelling of the face, lips, or tongue  breathing problems  chest pain  dizziness  signs and symptoms of infection like fever; chills; cough; sore throat; pain or trouble passing urine  unusually weak or tired Side effects that usually do not require medical attention (report to your doctor or health care professional if they continue or are bothersome):  diarrhea  headache  nausea  pain, redness, or irritation at site where injected  stomach pain or upset This list may not describe all possible side effects. Call your doctor for medical advice about side effects. You may report side effects to FDA at 1-800-FDA-1088. Where should I keep my medicine? Infusions will be given in a hospital or clinic and will not be stored at home. Storage for syringes and autoinjectors stored at home: Keep out of the reach of children. Store in a refrigerator between 2 and 8 degrees C (36 and 46 degrees F). Keep this medicine in the original container. Protect from light. Do not freeze. Do not shake. Throw away any unused medicine after the expiration date. NOTE: This sheet is a summary. It may not cover all possible  information. If you have questions about this medicine, talk to your doctor, pharmacist, or health care provider.  2020 Elsevier/Gold Standard (2019-06-19 14:01:21)  

## 2020-05-23 ENCOUNTER — Inpatient Hospital Stay: Payer: Medicare Other

## 2020-05-30 ENCOUNTER — Other Ambulatory Visit: Payer: Self-pay

## 2020-05-30 ENCOUNTER — Inpatient Hospital Stay: Payer: Medicare Other | Attending: Hematology and Oncology

## 2020-05-30 VITALS — BP 129/73 | HR 60 | Temp 99.0°F | Resp 20

## 2020-05-30 DIAGNOSIS — M06041 Rheumatoid arthritis without rheumatoid factor, right hand: Secondary | ICD-10-CM

## 2020-05-30 DIAGNOSIS — M069 Rheumatoid arthritis, unspecified: Secondary | ICD-10-CM | POA: Insufficient documentation

## 2020-05-30 DIAGNOSIS — M0579 Rheumatoid arthritis with rheumatoid factor of multiple sites without organ or systems involvement: Secondary | ICD-10-CM

## 2020-05-30 MED ORDER — SODIUM CHLORIDE 0.9 % IV SOLN
1000.0000 mg | INTRAVENOUS | Status: DC
  Administered 2020-05-30: 1000 mg via INTRAVENOUS
  Filled 2020-05-30: qty 40

## 2020-05-30 MED ORDER — SODIUM CHLORIDE 0.9 % IV SOLN
Freq: Once | INTRAVENOUS | Status: AC
  Filled 2020-05-30: qty 250

## 2020-06-20 ENCOUNTER — Inpatient Hospital Stay: Payer: Medicare Other

## 2020-06-27 ENCOUNTER — Other Ambulatory Visit: Payer: Self-pay

## 2020-06-27 ENCOUNTER — Inpatient Hospital Stay: Payer: Medicare Other

## 2020-06-27 ENCOUNTER — Inpatient Hospital Stay: Payer: Medicare Other | Attending: Hematology and Oncology

## 2020-06-27 VITALS — BP 137/68 | HR 62 | Temp 98.2°F | Resp 18

## 2020-06-27 DIAGNOSIS — M069 Rheumatoid arthritis, unspecified: Secondary | ICD-10-CM | POA: Insufficient documentation

## 2020-06-27 DIAGNOSIS — M05749 Rheumatoid arthritis with rheumatoid factor of unspecified hand without organ or systems involvement: Secondary | ICD-10-CM

## 2020-06-27 DIAGNOSIS — M0579 Rheumatoid arthritis with rheumatoid factor of multiple sites without organ or systems involvement: Secondary | ICD-10-CM

## 2020-06-27 MED ORDER — SODIUM CHLORIDE 0.9 % IV SOLN
Freq: Once | INTRAVENOUS | Status: AC
  Filled 2020-06-27: qty 250

## 2020-06-27 MED ORDER — SODIUM CHLORIDE 0.9 % IV SOLN
1000.0000 mg | INTRAVENOUS | Status: DC
  Administered 2020-06-27: 1000 mg via INTRAVENOUS
  Filled 2020-06-27: qty 40

## 2020-07-18 ENCOUNTER — Ambulatory Visit: Payer: Medicare Other

## 2020-07-18 ENCOUNTER — Ambulatory Visit: Payer: Medicare Other | Admitting: Hematology and Oncology

## 2020-07-25 ENCOUNTER — Ambulatory Visit: Payer: Medicare Other | Admitting: Hematology and Oncology

## 2020-07-25 ENCOUNTER — Ambulatory Visit: Payer: Medicare Other

## 2020-07-30 NOTE — Progress Notes (Signed)
[facility]  [address], [address] [place], [place] [address] Phone: [phone]  Fax: [phone]   Clinic Day:  07/31/2020  Referring physician: Elaina Pattee, MD  Chief Complaint: [name] is a 74 y.o. male with rheumatoid arthritis who is seen for 6 month assessment and continuation of Orencia (abatacept).    HPI: The patient was last seen in the medical oncology clinic on 02/01/2020. At that time, [name] was doing well.  Arthritis was well controlled.  Exam was stable. [name] received Orencia.  [name] underwent EGD with dilatation for an esophageal stricture on 02/19/2020.  [name] had a tortuous esophagus with a few bleeding erosions in the lower third of the esophagus.  EGD on 03/24/2020 revealed a tortuous lower esophagus with a prominent outpouching/shelf at 40 cm.  [name] underwent dilatation and Botox injection.  The patient saw Dr. Naida Sleight, cardiology, for an initial consult on 06/27/2020. [name] reported shortness of breath on exertion. Six minute walk test showed that [name] distance was severely below the normal range. Pulmonary function test was normal.  The patient saw Dr. Corrin Parker, pulmonology, on 07/15/2020.  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  [name] had acutely decompensated as of a couple of months prior when [name] picked up what appeared to be a viral infection.  [name] had several rounds of antibiotics which had not helped.  [name] was very weak with weight loss and fatigue.  [name] was prescribed a prednisone taper.  [name] will follow-up in 08/2020 for repeat PFTs and consideration of antifibrotic therapy.  The patient received Orencia monthly x 5 (02/29/2020 - 06/27/2020).  During the interim, [name] has been "good." [name] arthritis is well controlled. [name] had a viral illness in 01/2020 and had a fever for 4 days. [name] states that [name] is not over this virus yet and Dr. Corrin Parker is trying to figure out how to  treat them. [name] is currently tapering off of steroids and is taking 10 mg at this time. [name] vision is blurry because of the steroids.    Past Medical History:  Diagnosis Date  . Aortic aneurysm ([place]) 05/2017  . Chronic back pain   . Diabetes mellitus without complication ([place])   . Dyslipidemia   . GERD (gastroesophageal reflux disease)   . H/O hiatal hernia   . Hypertension   . Melanoma ([place])    "back; left arm"  . PONV (postoperative nausea and vomiting)   . Rheumatoid arthritis ([place])   . Spasm of esophagus     Past Surgical History:  Procedure Laterality Date  . aneursym repair    . aneursym repair and stent placement  05/2017  . CARPAL TUNNEL RELEASE Bilateral   . COLON SURGERY  2007   removed 18" of colon  . ERCP W/ METAL STENT PLACEMENT  11/2005   Archie Endo 12/14/2005  . ESOPHAGOGASTRODUODENOSCOPY (EGD) WITH ESOPHAGEAL DILATION     "several times'  . HAND SURGERY Bilateral    "plastic knuckles"  . LAPAROSCOPIC CHOLECYSTECTOMY  01/2006  . MELANOMA EXCISION     "back; left arm"  . NASAL SEPTUM SURGERY    . PELVIC FRACTURE SURGERY  1964   "busted in 2 places"  . RESECTION DISTAL CLAVICAL Right 05/13/2016   Procedure: DISTAL CLAVICLE EXCISION;  Surgeon: Ninetta Lights, MD;  Location: [facility];  Service: Orthopedics;  Laterality: Right;  . SHOULDER ARTHROSCOPY WITH ROTATOR CUFF REPAIR AND SUBACROMIAL DECOMPRESSION  Right 05/13/2016   Procedure: RIGHT SHOULDER SCOPE DEBRIDEMENT, ACROMIOPLASTY, ROTATOR CUFF REPAIR; RELEASE BICEPS TENDON AND DEBRIDEMENT LABRUM;  Surgeon: Ninetta Lights, MD;  Location: [facility];  Service: Orthopedics;  Laterality: Right;  . TIBIA FRACTURE SURGERY Left 2006   "put a steel rod in it"    Family History  Problem Relation Age of Onset  . Heart disease Mother   . Heart disease Father   . Cancer Maternal Grandmother     Social History:  reports that [name] quit smoking about 14 years ago. [name] smoking use included  cigarettes. [name] has a 46.00 pack-year smoking history. [name] has never used smokeless tobacco. [name] reports current alcohol use of about 9.0 standard drinks of alcohol per week. [name] reports that [name] does not use drugs. 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 The patient is alone today.  Allergies: No Known Allergies  Current Medications: Current Outpatient Medications  Medication Sig Dispense Refill  . abatacept (ORENCIA) 250 MG injection Inject into the vein every 30 (thirty) days.     Marland Kitchen aspirin 325 MG tablet Take 325 mg by mouth daily.     Marland Kitchen atorvastatin (LIPITOR) 20 MG tablet Take 20 mg by mouth daily.     . cetirizine (ZYRTEC) 10 MG tablet Take by mouth.     . chlorhexidine (PERIDEX) 0.12 % solution     . clopidogrel (PLAVIX) 75 MG tablet Take 1 tablet by mouth daily.     . famotidine (PEPCID) 20 MG tablet     . fluticasone (FLONASE) 50 MCG/ACT nasal spray Place 2 sprays into the nose daily.     . folic acid (FOLVITE) 1 MG tablet Take 1 mg by mouth daily.     . irbesartan (AVAPRO) 150 MG tablet Take 150 mg by mouth daily.     . magnesium oxide (MAG-OX) 400 MG tablet Take 800 mg by mouth 3 (three) times daily.     . metFORMIN (GLUCOPHAGE) 500 MG tablet Take 500 mg by mouth daily with breakfast.     . methotrexate (RHEUMATREX) 2.5 MG tablet Take 2.5 mg by mouth once a week.     . methotrexate (RHEUMATREX) 2.5 MG tablet Take by mouth.     . Multiple Vitamin (MULTI-VITAMIN) tablet Take by mouth.     Marland Kitchen omeprazole (PRILOSEC) 20 MG capsule Take 20 mg by mouth daily.     Marland Kitchen oxyCODONE-acetaminophen (ROXICET) 5-325 MG tablet Take 1-2 tablets by mouth every 4 (four) hours as needed. 60 tablet 0  . pantoprazole (PROTONIX) 20 MG tablet Take by mouth.     . potassium chloride (K-DUR) 10 MEQ tablet Take 10 mEq by mouth daily.     . predniSONE (DELTASONE) 5 MG tablet Take 5 mg by mouth daily with breakfast.     . tadalafil  (CIALIS) 5 MG tablet Take 5 mg by mouth daily.     Marland Kitchen tretinoin (RETIN-A) 0.05 % cream Apply topically.      No current facility-administered medications for this visit.   Facility-Administered Medications Ordered in Other Visits  Medication Dose Route Frequency Provider Last Rate Last Admin  . 0.9 %  sodium chloride infusion   Intravenous Once Lloyd Huger, MD      . 0.9 %  sodium chloride infusion   Intravenous Once Lloyd Huger, MD      . abatacept [facility]) 1,000 mg in sodium chloride 0.9 %  100 mL IVPB  1,000 mg Intravenous Q28 days Lloyd Huger, MD      . abatacept [facility]) 1,000 mg in sodium chloride 0.9 % 100 mL IVPB  1,000 mg Intravenous Q28 days Lloyd Huger, MD      . abatacept [facility]) 1,000 mg in sodium chloride 0.9 % 100 mL IVPB  1,000 mg Intravenous Q28 days Lloyd Huger, MD   Stopped at 02/23/19 1340    Review of Systems  Constitutional: Negative.  Negative for chills, diaphoresis, fever, malaise/fatigue and weight loss (up 5 lbs).       Feels "good".  HENT: Negative.  Negative for congestion, ear discharge, ear pain, hearing loss, nosebleeds, sinus pain, sore throat and tinnitus.   Eyes: Negative.  Negative for blurred vision and double vision.  Respiratory: Negative for cough, hemoptysis, sputum production, shortness of breath and wheezing.        Viral illness.  Cardiovascular: Negative.  Negative for chest pain, palpitations, claudication, leg swelling and PND.  Gastrointestinal: Negative.  Negative for abdominal pain, blood in stool, constipation, diarrhea, heartburn, melena, nausea and vomiting.       Esophageal stricture s/p dilatation.  Genitourinary: Negative.  Negative for dysuria, frequency, hematuria and urgency.  Musculoskeletal: Positive for joint pain (rhuematoid arthritis). Negative for back pain, myalgias and neck pain.  Skin: Negative.  Negative for itching and rash.  Neurological: Negative.  Negative for dizziness,  tingling, sensory change, focal weakness and weakness.  Endo/Heme/Allergies: Does not bruise/bleed easily.       Diabetes.  Psychiatric/Behavioral: Negative.  Negative for depression, memory loss and substance abuse. The patient is not nervous/anxious and does not have insomnia.   All other systems reviewed and are negative.  Performance status (ECOG):  1  Vitals Blood pressure (!) 116/54, pulse 68, temperature (!) 97.3 F (36.3 C), temperature source Tympanic, weight 201 lb 6.2 oz (91.3 kg), SpO2 99 %.   Physical Exam Vitals and nursing note reviewed.  Constitutional:      General: [name] is not in acute distress.    Appearance: [name] is well-developed. [name] is not diaphoretic.  HENT:     Head: Normocephalic and atraumatic.     Comments: Brown hair.    Mouth/Throat:     Mouth: Mucous membranes are moist.     Pharynx: Oropharynx is clear. No oropharyngeal exudate.  Eyes:     General: No scleral icterus.    Extraocular Movements: Extraocular movements intact.     Conjunctiva/sclera: Conjunctivae normal.     Pupils: Pupils are equal, round, and reactive to light.     Comments: Blue eyes.  Neck:     Vascular: No JVD.  Cardiovascular:     Rate and Rhythm: Normal rate and regular rhythm.     Heart sounds: Normal heart sounds. No murmur heard.   Pulmonary:     Effort: Pulmonary effort is normal. No respiratory distress.     Breath sounds: Normal breath sounds. No wheezing or rales.     Comments: Dry crackles at the bases (stable). Chest:     Chest wall: No tenderness.  Abdominal:     General: Bowel sounds are normal. There is no distension.     Palpations: Abdomen is soft. There is no mass.     Tenderness: There is no abdominal tenderness. There is no guarding or rebound.  Musculoskeletal:        General: No tenderness. Normal range of motion.     Cervical back: Normal range of  motion and neck supple.     Comments: Bilateral ulnar deviation.  Bilateral lower extremity edema (3+ left  and 2+ right).  Lymphadenopathy:     Head:     Right side of head: No preauricular, posterior auricular or occipital adenopathy.     Left side of head: No preauricular, posterior auricular or occipital adenopathy.     Cervical: No cervical adenopathy.     Upper Body:     Right upper body: No supraclavicular or axillary adenopathy.     Left upper body: No supraclavicular or axillary adenopathy.     Lower Body: No right inguinal adenopathy. No left inguinal adenopathy.  Skin:    General: Skin is warm and dry.     Coloration: Skin is not pale.     Findings: No erythema or rash.  Neurological:     Mental Status: [name] is alert and oriented to person, place, and time.  Psychiatric:        Behavior: Behavior normal.        Thought Content: Thought content normal.        Judgment: Judgment normal.    No visits with results within 3 Day(s) from this visit.  Latest known visit with results is:  Infusion on 02/23/2019  Component Date Value Ref Range Status  . C Diff antigen 02/23/2019 NEGATIVE  NEGATIVE Final  . C Diff toxin 02/23/2019 NEGATIVE  NEGATIVE Final  . C Diff interpretation 02/23/2019 No C. difficile detected.   Final   Performed at [facility], [address], [place], [place] [address]    Assessment:  BRALON ANTKOWIAK is a 74 y.o. male with rheumatoid arthritis.[name] was diagnosed in 83. [name] was initially treated with Remicade.  [name] receives [employer]. [name] received [name] first infusion on 07/25/2015 (last 06/27/2020).  [name] is also on methotrexate 15 mg p.o. weekly and prednisone 5 mg p.o. 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 [name] was treated with chemotherapy and radiation at [facility]. Treatment completed on 05/05/2015.  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   Symptomatically, [name] feels great.  Arthritis is controlled.  [name] is on a prednisone taper.   Exam is stable.  Plan: 1.   Rheumatoid arthritis Symptomatically, [name] is doing well.             Exam is stable on Orencia..             Continue monthly treatment per Dr. Francella Solian. Wilfred Curtis.   Labs are drawn at [facility].  Contact covering physician regarding patient's current steroid taper. 2.   Await phone follow-up with Dr Remer Macho prior to [place]- done.  Treatment approved. 3.   Orencia today. 4.   RTC monthly x 5 for Orencia.   5.   RTC in 6 months for MD assessment and Orencia.  I discussed the assessment and treatment plan with the patient.  The patient was provided an opportunity to ask questions and all were answered.  The patient agreed with the plan and demonstrated an understanding of the instructions.  The patient was advised to call back if the symptoms worsen or if the condition fails to improve as anticipated.   Lequita Asal, MD, PhD    07/31/2020, 9:57 AM  I, Mirian Mo Tufford, am acting as a Education administrator for Lequita Asal, MD.  I, [place] Mike Gip, MD, have reviewed the  above documentation for accuracy and completeness, and I agree with the above.

## 2020-07-31 ENCOUNTER — Other Ambulatory Visit: Payer: Self-pay

## 2020-07-31 ENCOUNTER — Inpatient Hospital Stay: Payer: Medicare Other | Attending: Hematology and Oncology | Admitting: Hematology and Oncology

## 2020-07-31 ENCOUNTER — Inpatient Hospital Stay: Payer: Medicare Other

## 2020-07-31 ENCOUNTER — Encounter: Payer: Self-pay | Admitting: Hematology and Oncology

## 2020-07-31 VITALS — BP 116/54 | HR 68 | Temp 97.3°F | Wt 201.4 lb

## 2020-07-31 VITALS — BP 135/65 | HR 60 | Temp 96.8°F

## 2020-07-31 DIAGNOSIS — M0579 Rheumatoid arthritis with rheumatoid factor of multiple sites without organ or systems involvement: Secondary | ICD-10-CM | POA: Diagnosis not present

## 2020-07-31 DIAGNOSIS — M069 Rheumatoid arthritis, unspecified: Secondary | ICD-10-CM | POA: Insufficient documentation

## 2020-07-31 MED ORDER — SODIUM CHLORIDE 0.9 % IV SOLN
1000.0000 mg | INTRAVENOUS | Status: DC
  Administered 2020-07-31: 1000 mg via INTRAVENOUS
  Filled 2020-07-31: qty 40

## 2020-07-31 MED ORDER — SODIUM CHLORIDE 0.9 % IV SOLN
Freq: Once | INTRAVENOUS | Status: AC
  Filled 2020-07-31: qty 250

## 2020-07-31 NOTE — Progress Notes (Signed)
Patient here for oncology follow-up appointment, says he's better now from respiratory virus and has a new heart doctor, no complaints or concerns at this time.

## 2020-08-28 ENCOUNTER — Other Ambulatory Visit: Payer: Self-pay

## 2020-08-28 ENCOUNTER — Inpatient Hospital Stay: Payer: Medicare Other | Attending: Hematology and Oncology

## 2020-08-28 VITALS — BP 147/71 | HR 67 | Temp 97.3°F | Resp 18

## 2020-08-28 DIAGNOSIS — M069 Rheumatoid arthritis, unspecified: Secondary | ICD-10-CM | POA: Diagnosis not present

## 2020-08-28 DIAGNOSIS — M0579 Rheumatoid arthritis with rheumatoid factor of multiple sites without organ or systems involvement: Secondary | ICD-10-CM

## 2020-08-28 MED ORDER — SODIUM CHLORIDE 0.9 % IV SOLN
Freq: Once | INTRAVENOUS | Status: AC
  Filled 2020-08-28: qty 250

## 2020-08-28 MED ORDER — SODIUM CHLORIDE 0.9 % IV SOLN
1000.0000 mg | INTRAVENOUS | Status: DC
  Administered 2020-08-28: 1000 mg via INTRAVENOUS
  Filled 2020-08-28: qty 40

## 2020-09-25 ENCOUNTER — Inpatient Hospital Stay: Payer: Medicare Other

## 2020-09-25 ENCOUNTER — Telehealth: Payer: Self-pay | Admitting: Hematology and Oncology

## 2020-09-25 NOTE — Telephone Encounter (Signed)
I called patient to reshedule appt for Orencia and he said he has transferred to [facility] for his care. He said he sent an email but [facility] may have blocked it.

## 2020-10-23 ENCOUNTER — Inpatient Hospital Stay: Payer: Medicare Other

## 2020-11-19 ENCOUNTER — Ambulatory Visit: Payer: Medicare Other

## 2020-12-17 ENCOUNTER — Ambulatory Visit: Payer: Medicare Other

## 2021-01-15 ENCOUNTER — Ambulatory Visit: Payer: Medicare Other | Admitting: Hematology and Oncology

## 2021-01-15 ENCOUNTER — Ambulatory Visit: Payer: Medicare Other

## 2021-01-26 ENCOUNTER — Emergency Department (HOSPITAL_COMMUNITY): Payer: Medicare Other

## 2021-01-26 ENCOUNTER — Inpatient Hospital Stay (HOSPITAL_COMMUNITY)
Admission: EM | Admit: 2021-01-26 | Discharge: 2021-02-05 | DRG: 177 | Disposition: A | Discharge status: Skilled Nursing Facility | Payer: Medicare Other | Attending: Internal Medicine | Admitting: Internal Medicine

## 2021-01-26 ENCOUNTER — Other Ambulatory Visit: Payer: Self-pay

## 2021-01-26 DIAGNOSIS — J1282 Pneumonia due to coronavirus disease 2019: Secondary | ICD-10-CM | POA: Diagnosis present

## 2021-01-26 DIAGNOSIS — T50995A Adverse effect of other drugs, medicaments and biological substances, initial encounter: Secondary | ICD-10-CM | POA: Diagnosis present

## 2021-01-26 DIAGNOSIS — J849 Interstitial pulmonary disease, unspecified: Secondary | ICD-10-CM

## 2021-01-26 DIAGNOSIS — E785 Hyperlipidemia, unspecified: Secondary | ICD-10-CM | POA: Diagnosis present

## 2021-01-26 DIAGNOSIS — Z79899 Other long term (current) drug therapy: Secondary | ICD-10-CM

## 2021-01-26 DIAGNOSIS — D65 Disseminated intravascular coagulation [defibrination syndrome]: Secondary | ICD-10-CM

## 2021-01-26 DIAGNOSIS — E872 Acidosis: Secondary | ICD-10-CM | POA: Diagnosis present

## 2021-01-26 DIAGNOSIS — K219 Gastro-esophageal reflux disease without esophagitis: Secondary | ICD-10-CM | POA: Diagnosis present

## 2021-01-26 DIAGNOSIS — E876 Hypokalemia: Secondary | ICD-10-CM | POA: Diagnosis present

## 2021-01-26 DIAGNOSIS — I82409 Acute embolism and thrombosis of unspecified deep veins of unspecified lower extremity: Secondary | ICD-10-CM | POA: Diagnosis not present

## 2021-01-26 DIAGNOSIS — D61818 Other pancytopenia: Secondary | ICD-10-CM

## 2021-01-26 DIAGNOSIS — K5900 Constipation, unspecified: Secondary | ICD-10-CM | POA: Diagnosis not present

## 2021-01-26 DIAGNOSIS — Z87891 Personal history of nicotine dependence: Secondary | ICD-10-CM | POA: Diagnosis not present

## 2021-01-26 DIAGNOSIS — D849 Immunodeficiency, unspecified: Secondary | ICD-10-CM | POA: Diagnosis not present

## 2021-01-26 DIAGNOSIS — E119 Type 2 diabetes mellitus without complications: Secondary | ICD-10-CM | POA: Diagnosis present

## 2021-01-26 DIAGNOSIS — Z86718 Personal history of other venous thrombosis and embolism: Secondary | ICD-10-CM

## 2021-01-26 DIAGNOSIS — Z9049 Acquired absence of other specified parts of digestive tract: Secondary | ICD-10-CM | POA: Diagnosis not present

## 2021-01-26 DIAGNOSIS — N179 Acute kidney failure, unspecified: Secondary | ICD-10-CM

## 2021-01-26 DIAGNOSIS — Z7902 Long term (current) use of antithrombotics/antiplatelets: Secondary | ICD-10-CM | POA: Diagnosis not present

## 2021-01-26 DIAGNOSIS — E860 Dehydration: Secondary | ICD-10-CM | POA: Diagnosis present

## 2021-01-26 DIAGNOSIS — I10 Essential (primary) hypertension: Secondary | ICD-10-CM | POA: Diagnosis present

## 2021-01-26 DIAGNOSIS — R197 Diarrhea, unspecified: Secondary | ICD-10-CM | POA: Diagnosis not present

## 2021-01-26 DIAGNOSIS — Z7901 Long term (current) use of anticoagulants: Secondary | ICD-10-CM | POA: Diagnosis not present

## 2021-01-26 DIAGNOSIS — Z7952 Long term (current) use of systemic steroids: Secondary | ICD-10-CM | POA: Diagnosis not present

## 2021-01-26 DIAGNOSIS — U071 COVID-19: Secondary | ICD-10-CM | POA: Diagnosis present

## 2021-01-26 DIAGNOSIS — Z85820 Personal history of malignant melanoma of skin: Secondary | ICD-10-CM

## 2021-01-26 DIAGNOSIS — M069 Rheumatoid arthritis, unspecified: Secondary | ICD-10-CM | POA: Diagnosis present

## 2021-01-26 DIAGNOSIS — Z86711 Personal history of pulmonary embolism: Secondary | ICD-10-CM

## 2021-01-26 DIAGNOSIS — Z7984 Long term (current) use of oral hypoglycemic drugs: Secondary | ICD-10-CM

## 2021-01-26 DIAGNOSIS — Z923 Personal history of irradiation: Secondary | ICD-10-CM

## 2021-01-26 DIAGNOSIS — Z7982 Long term (current) use of aspirin: Secondary | ICD-10-CM

## 2021-01-26 LAB — FERRITIN: Ferritin: 476 ng/mL — ABNORMAL HIGH (ref 24–336)

## 2021-01-26 LAB — COMPREHENSIVE METABOLIC PANEL
ALT: 36 U/L (ref 0–44)
AST: 56 U/L — ABNORMAL HIGH (ref 15–41)
Albumin: 3 g/dL — ABNORMAL LOW (ref 3.5–5.0)
Alkaline Phosphatase: 83 U/L (ref 38–126)
Anion gap: 16 — ABNORMAL HIGH (ref 5–15)
BUN: 28 mg/dL — ABNORMAL HIGH (ref 8–23)
CO2: 17 mmol/L — ABNORMAL LOW (ref 22–32)
Calcium: 8.2 mg/dL — ABNORMAL LOW (ref 8.9–10.3)
Chloride: 104 mmol/L (ref 98–111)
Creatinine, Ser: 1.36 mg/dL — ABNORMAL HIGH (ref 0.61–1.24)
GFR, Estimated: 54 mL/min — ABNORMAL LOW (ref >60)
Glucose, Bld: 159 mg/dL — ABNORMAL HIGH (ref 70–99)
Potassium: 3.6 mmol/L (ref 3.5–5.1)
Sodium: 137 mmol/L (ref 135–145)
Total Bilirubin: 0.9 mg/dL (ref 0.3–1.2)
Total Protein: 6 g/dL — ABNORMAL LOW (ref 6.5–8.1)

## 2021-01-26 LAB — D-DIMER, QUANTITATIVE: D-Dimer, Quant: 2.65 ug/mL-FEU — ABNORMAL HIGH (ref 0.00–0.50)

## 2021-01-26 LAB — CBC
HCT: 37.3 % — ABNORMAL LOW (ref 39.0–52.0)
Hemoglobin: 11.5 g/dL — ABNORMAL LOW (ref 13.0–17.0)
MCH: 28.8 pg (ref 26.0–34.0)
MCHC: 30.8 g/dL (ref 30.0–36.0)
MCV: 93.5 fL (ref 80.0–100.0)
Platelets: 91 10*3/uL — ABNORMAL LOW (ref 150–400)
RBC: 3.99 MIL/uL — ABNORMAL LOW (ref 4.22–5.81)
RDW: 19.9 % — ABNORMAL HIGH (ref 11.5–15.5)
WBC: 3.7 10*3/uL — ABNORMAL LOW (ref 4.0–10.5)
nRBC: 0 % (ref 0.0–0.2)

## 2021-01-26 LAB — LACTIC ACID, PLASMA
Lactic Acid, Venous: 3.5 mmol/L (ref 0.5–1.9)
Lactic Acid, Venous: 1.9 mmol/L (ref 0.5–1.9)

## 2021-01-26 LAB — LACTATE DEHYDROGENASE: LDH: 304 U/L — ABNORMAL HIGH (ref 98–192)

## 2021-01-26 LAB — FIBRINOGEN: Fibrinogen: 614 mg/dL — ABNORMAL HIGH (ref 210–475)

## 2021-01-26 LAB — C-REACTIVE PROTEIN: CRP: 10.7 mg/dL — ABNORMAL HIGH (ref <1.0)

## 2021-01-26 LAB — CBG MONITORING, ED: Glucose-Capillary: 98 mg/dL (ref 70–99)

## 2021-01-26 LAB — POC SARS CORONAVIRUS 2 AG -  ED: SARS Coronavirus 2 Ag: POSITIVE — AB

## 2021-01-26 LAB — TRIGLYCERIDES: Triglycerides: 130 mg/dL (ref <150)

## 2021-01-26 LAB — PROCALCITONIN: Procalcitonin: 0.16 ng/mL

## 2021-01-26 LAB — LIPASE, BLOOD: Lipase: 47 U/L (ref 11–51)

## 2021-01-26 MED ORDER — SODIUM CHLORIDE 0.9 % IV SOLN
1.0000 g | Freq: Once | INTRAVENOUS | Status: AC
  Administered 2021-01-26: 1 g via INTRAVENOUS
  Filled 2021-01-26: qty 10

## 2021-01-26 MED ORDER — ACETAMINOPHEN 325 MG PO TABS
650.0000 mg | ORAL_TABLET | Freq: Four times a day (QID) | ORAL | Status: DC | PRN
  Administered 2021-01-28 – 2021-02-01 (×3): 650 mg via ORAL
  Filled 2021-01-26 (×3): qty 2

## 2021-01-26 MED ORDER — ALBUTEROL SULFATE HFA 108 (90 BASE) MCG/ACT IN AERS
2.0000 | INHALATION_SPRAY | Freq: Four times a day (QID) | RESPIRATORY_TRACT | Status: DC
  Administered 2021-01-26 – 2021-01-27 (×3): 2 via RESPIRATORY_TRACT
  Filled 2021-01-26: qty 6.7

## 2021-01-26 MED ORDER — LACTATED RINGERS IV BOLUS
1000.0000 mL | Freq: Once | INTRAVENOUS | Status: AC
  Administered 2021-01-26: 1000 mL via INTRAVENOUS

## 2021-01-26 MED ORDER — LACTATED RINGERS IV SOLN: INTRAVENOUS | Status: AC

## 2021-01-26 MED ORDER — SODIUM CHLORIDE 0.9 % IV SOLN
500.0000 mg | Freq: Once | INTRAVENOUS | Status: AC
  Administered 2021-01-26: 500 mg via INTRAVENOUS
  Filled 2021-01-26: qty 500

## 2021-01-26 MED ORDER — NINTEDANIB ESYLATE 100 MG PO CAPS: 100.0000 mg | ORAL_CAPSULE | Freq: Every day | ORAL | Status: DC

## 2021-01-26 MED ORDER — SODIUM CHLORIDE 0.9 % IV SOLN: 500.0000 mg | INTRAVENOUS | Status: DC

## 2021-01-26 MED ORDER — GUAIFENESIN-DM 100-10 MG/5ML PO SYRP: 10.0000 mL | ORAL_SOLUTION | ORAL | Status: DC | PRN

## 2021-01-26 MED ORDER — CLOPIDOGREL BISULFATE 75 MG PO TABS
75.0000 mg | ORAL_TABLET | Freq: Every day | ORAL | Status: DC
Start: 2021-01-26 — End: 2021-02-05
  Administered 2021-01-26 – 2021-02-05 (×11): 75 mg via ORAL
  Filled 2021-01-26 (×11): qty 1

## 2021-01-26 MED ORDER — HYDROCOD POLST-CPM POLST ER 10-8 MG/5ML PO SUER
5.0000 mL | Freq: Two times a day (BID) | ORAL | Status: DC | PRN
  Administered 2021-01-28 – 2021-01-31 (×4): 5 mL via ORAL
  Filled 2021-01-26 (×4): qty 5

## 2021-01-26 MED ORDER — SODIUM CHLORIDE 0.9 % IV SOLN: 2.0000 g | INTRAVENOUS | Status: DC

## 2021-01-26 MED ORDER — PANTOPRAZOLE SODIUM 40 MG PO TBEC
40.0000 mg | DELAYED_RELEASE_TABLET | Freq: Every day | ORAL | Status: DC
  Administered 2021-01-26 – 2021-02-05 (×11): 40 mg via ORAL
  Filled 2021-01-26 (×11): qty 1

## 2021-01-26 MED ORDER — PREDNISONE 5 MG PO TABS
5.0000 mg | ORAL_TABLET | Freq: Every day | ORAL | Status: DC
  Administered 2021-01-27 – 2021-02-02 (×7): 5 mg via ORAL
  Filled 2021-01-26 (×8): qty 1

## 2021-01-26 MED ORDER — SODIUM CHLORIDE 0.9 % IV BOLUS
1000.0000 mL | Freq: Once | INTRAVENOUS | Status: AC
  Administered 2021-01-26: 1000 mL via INTRAVENOUS

## 2021-01-26 MED ORDER — APIXABAN 5 MG PO TABS
5.0000 mg | ORAL_TABLET | Freq: Two times a day (BID) | ORAL | Status: DC
  Administered 2021-01-26 – 2021-02-05 (×20): 5 mg via ORAL
  Filled 2021-01-26 (×10): qty 1
  Filled 2021-01-26: qty 2
  Filled 2021-01-26 (×9): qty 1

## 2021-01-26 MED ORDER — INSULIN ASPART 100 UNIT/ML ~~LOC~~ SOLN
0.0000 [IU] | Freq: Three times a day (TID) | SUBCUTANEOUS | Status: DC
  Administered 2021-01-27: 1 [IU] via SUBCUTANEOUS
  Administered 2021-01-27 – 2021-01-29 (×4): 2 [IU] via SUBCUTANEOUS
  Administered 2021-01-29 – 2021-01-30 (×3): 1 [IU] via SUBCUTANEOUS
  Administered 2021-01-31 – 2021-02-01 (×2): 2 [IU] via SUBCUTANEOUS
  Administered 2021-02-01: 3 [IU] via SUBCUTANEOUS
  Administered 2021-02-01: 2 [IU] via SUBCUTANEOUS

## 2021-01-26 MED ORDER — ATORVASTATIN CALCIUM 10 MG PO TABS
20.0000 mg | ORAL_TABLET | Freq: Every day | ORAL | Status: DC
Start: 2021-01-26 — End: 2021-02-05
  Administered 2021-01-26 – 2021-02-05 (×11): 20 mg via ORAL
  Filled 2021-01-26 (×12): qty 2

## 2021-01-26 NOTE — ED Notes (Signed)
Son of PT Name Jessiah Steinhart [phone]

## 2021-01-26 NOTE — ED Notes (Signed)
Attempted to give report and was told the charge nurse didn't approve pt yet and they'll call me back.

## 2021-01-26 NOTE — ED Triage Notes (Signed)
Pt from home, hasn't eaten in several days and has severe diarrhea. Pt orthostatic from 110/70-->80/40. 300 NS bolus given by EMS. C/o weakness and generalized body aches.

## 2021-01-26 NOTE — H&P (Signed)
Date: 01/26/2021               Patient Name:  [name] MRN: [id number]  DOB: 28-Jun-1946 Age / Sex: 75 y.o., male   PCP: Elaina Pattee, MD         Medical Service: Internal Medicine Teaching Service         Attending Physician: Dr. Dareen Piano    First Contact: Dr. Collene Gobble Pager: [phone]  Second Contact: Dr. Darrick Meigs Pager: [phone]       After Hours (After 5p/  First Contact Pager: [phone]  weekends / holidays): Second Contact Pager: [phone]   Chief Complaint: Generalized weakness  History of Present Illness: [name]. 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  Patient unable to provide specific timeline or details regarding his symptoms and reports to our team that he has not felt well "in a long time." On further questioning, patient reports that for possibly the past several weeks he has been experiencing headaches, lightheadedness, diarrhea, generalized weakness, lack of appetite and poor oral intake. Additionally, he endorses development of cough productive of brown sputum over the past several days with continued shortness of breath with activity and at rest unchanged from his baseline. He otherwise denies chest pain, fevers, chills, abdominal pain, nausea, or vomiting.  Due to his symptoms, he presented to [facility], where he receives all of his primary care and specialty care, for evaluation on 12/30/20 where he tested negative for COVID-19 but was found to have unprovoked DVTs and PE and started on apixaban. Following discharge, he was evaluated by his PCP on 01/09/21 for continuation of symptoms, found to have an AKI in the setting of significant diarrhea and transported back to [place] for admission from 01/09/21 to 01/12/21. Patient's prescribed nintedanib for his interstitial lung disease was determined to be the cause of his diarrhea and decreased from 200mg  twice daily to 100mg  twice daily on discharge. Following discharge, patient reports that he has continued to "not feel well" and stopped taking all of his prescribed medications. A friend of his came to visit his home today and called EMS due to his ongoing symptoms. Patient initially planned to be taken to [facility], however patient was brought to [facility]. He has been vaccinated against COVID-19, however he has not received a booster.  ED Course: On arrival to the ED, patient was afebrile, hemodynamically stable, saturating at 95-100% on room air. Initial labs: COVID positive; CMP revealed CO2 17, anion gap 16, Cr 1.36, BUN 28; CBC revealed WBC 3.7, Hb 11.5, MCV 93.5, RDW 19.9, PLT 91; lactic acid 3.5 then 1.9; D-dimer 2.65; Fibrinogen 614; Triglycerides 130; LDH 304; ferritin 476; CRP 10.7; procalcitonin 0.16; lipase within normal limits; urinalysis pending; blood cultures collected. CXR revealed hazy left lower lobe opacity suspicious for pneumonia. Patient received two 1L boluses of normal saline and started on ceftriaxone and azithromycin by ED  provider. Patient was admitted to IMT[facility] for further evaluation and management.  Medications (per review of care everywhere records): Abatacept 250mg  injection monthly Apixaban 5mg  twice daily  Atorvastatin 40mg  daily Clopidogrel 75mg  daily Famotidine 20mg  daily  PRN Ferrous sulfate 325mg  daily Folic acid 1mg  daily Fluticasone 44mcg twice daily Irbesartan 150mg  daily Metformin 1,000mg  daily Methotrexate 15mg  once weekly Nintedanib (Ofev) 100mg  twice daily (reduced from 200mg  twice daily on 01/12/21) Omeprazole 20mg  twice daily PRN Prednisone 5mg  daily Tadalafil 10mg  daily  Allergies: Allergies as of 01/26/2021  . (No Known Allergies)   Past Medical History:  Diagnosis Date  . Aortic aneurysm ([place]) 05/2017  . Chronic back pain   . Diabetes mellitus without complication ([place])   . Dyslipidemia   . GERD (gastroesophageal reflux disease)   . H/O hiatal hernia   . Hypertension   . Melanoma ([place])    "back; left arm"  . PONV (postoperative nausea and vomiting)   . Rheumatoid arthritis ([place])   . Spasm of esophagus    Family History:  Family History  Problem Relation Age of Onset  . Heart disease Mother   . Heart disease Father   . Cancer Maternal Grandmother    Social History:  Lives in [place] by himself, however he has a son in [place] that visits him frequently. Works at a [employer]. Endorses history of tobacco use, approximately one pack per day for fifty years, however he quit using in 2006. Denies current alcohol use or recreational drug use.  Review of Systems: A complete ROS was negative except as per HPI.  Physical Exam: Blood pressure (!) 141/73, pulse 84, temperature (!) 97.5 F (36.4 C), temperature source Oral, resp. rate (!) 21, SpO2 100 %. Physical Exam Vitals and nursing note reviewed.  Constitutional:      Appearance: He is normal weight. He is ill-appearing.  HENT:     Head: Normocephalic and atraumatic.     Mouth/Throat:     Mouth: Mucous membranes are moist.     Pharynx: Oropharynx is clear.  Eyes:     Extraocular Movements: Extraocular movements intact.     Conjunctiva/sclera: Conjunctivae normal.  Cardiovascular:     Rate and Rhythm: Normal rate and regular rhythm.      Pulses: Normal pulses.     Heart sounds: Normal heart sounds.  Pulmonary:     Effort: Pulmonary effort is normal.     Breath sounds: Rales present.     Comments: Rales in left lower lobe Abdominal:     General: Abdomen is flat. Bowel sounds are normal.     Palpations: Abdomen is soft.     Tenderness: There is no abdominal tenderness.  Musculoskeletal:        General: No tenderness. Normal range of motion.     Cervical back: Normal range of motion and neck supple.     Right lower leg: No edema.     Left lower leg: No edema.     Comments: No calf tenderness  Skin:    Capillary Refill: Capillary refill takes 2 to 3 seconds.     Findings: No rash.     Comments: Cold hands and feet.  Neurological:     General: No focal deficit present.     Mental Status: He is alert and oriented to person, place, and time.  Psychiatric:        Mood and Affect: Mood normal.        Behavior: Behavior normal.    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  #COVID-19 infection, active #Superimposed bacterial left lower lobe pneumonia, active Vaccinated patient presenting today for evaluation of ongoing symptoms of diarrhea, poor PO intake, generalized weakness, and headaches with development of productive cough and persistent shortness of breath. Patient tested positive for COVID on admission with CXR concerning for left lower lobe consolidation consistent with superimposed bacterial pneumonia although procalcitonin not significantly elevated. Fortunately, patient is not hypoxic and therefore does not require initiation of dexamethasone at this time. Patient would benefit from continuation of IV antibiotics. -Continue azithromycin -Continue ceftriaxone -Daily CRP, D-dimer, Ferritin -Daily CBC with Diff -Daily CMP -Daily Mg and Phos -Robitussin 56mL Q4H PRN, Tussionex 53mL Q12H -Albuterol 2 puff Q6H -Airborne and contact precautions -Continuous pulse oximetry -Incentive spirometry, flutter valve  #Interstitial lung disease, chronic Patient with history of interstitial lung disease and follows closely with pulmonology at [facility]. Patient has had prescribed nintedanib reduced from 200mg  twice daily to 100mg  twice daily in the setting of ongoing diarrhea. -Hold home nintedanib -Consider pulmonology consultation  #AKI, active Patient noted to have acute kidney injury on admission with creatinine of 1.36 up from baseline of approximately 1.0. Patient's AKI is likely secondary to poor oral intake and ongoing diarrhea. Patient is s/p two liters of normal saline. Patient would benefit from continuation of IVF for now. -Continue LR at 100cc/hr -Monitor renal function on daily CMP  #Recent left common femoral, tibial and peroneal DVTs and PE Patient hospitalized on 12/30/20 at [facility] and found to have unprovoked DVT/PE. Patient was started on apixaban, however he reports being nonadherent to this medication for the past several days. Patient currently  denies chest pain or worsened shortness of breath from baseline. -Continue home apixaban 5mg  twice daily  #HTN, chronic Patient has history of HTN for which he takes irbesartan 150mg  daily. Patient is currently normotensive. Patient should hold ARB in setting of AKI and consider resuming upon resolution. -Hold irbesartan in setting of AKI  #T2DM, chronic Last HbA1c of 6.9. Patient takes metformin 1000mg  daily for this condition. Blood glucose only mildly elevated on initial labs, however in the setting of poor PO intake. -Sensitive SSI (0-9 units TID AC)   #Rheumatoid arthritis, chronic -Continue home prednisone 5mg  daily -Hold home methotrexate as once weekly dosing -Hold home abatacept as once monthly dosing  #GERD, chronic -Continue home pantoprazole 40mg  daily  #HLD, chronic -Continue home atorvastatin 20mg  daily  #PAD, chronic -Continue home clopidogrel 75mg  daily  #VTE ppx: Continue home apixaban 5mg  twice daily #Diet: Soft, thin liquids #Code status: Full code #IVF: 100cc/hr; s/p two 1L boluses of NS  Dispo: Admit patient to Inpatient with expected  length of stay greater than 2 midnights.  Signed: Cato Mulligan, MD 01/26/2021, 3:34 PM  Pager: [phone] After 5pm on weekdays and 1pm on weekends: On Call pager: [phone]

## 2021-01-26 NOTE — ED Provider Notes (Signed)
[place] EMERGENCY DEPARTMENT Provider Note  CSN: [id number] Arrival date & time: 01/26/21 0913    History Chief Complaint  Patient presents with  . Hypotension  . Diarrhea    HPI  [name] is a 75 y.o. male with multiple medical problems, primarily managed at [facility], brought to the ED via EMS after several days of poor appetite, frequent diarrhea, generalized weakness and body aches. No known fever. He is SOB all the time with history of ILD and recently diagnosed with PE. He has not had his blood thinner in a couple of days due to weakness. He has had Covid vaccine and had neg Covid Ag test on his most recent admission to [facility] on 1/14. He was orthostatic with EMS, given IVF enroute. Complaining of dry mouth. Lives at home alone.    Past Medical History:  Diagnosis Date  . Aortic aneurysm ([place]) 05/2017  . Chronic back pain   . Diabetes mellitus without complication ([place])   . Dyslipidemia   . GERD (gastroesophageal reflux disease)   . H/O hiatal hernia   . Hypertension   . Melanoma ([place])    "back; left arm"  . PONV (postoperative nausea and vomiting)   . Rheumatoid arthritis ([place])   . Spasm of esophagus     Past Surgical History:  Procedure Laterality Date  . aneursym repair    . aneursym repair and stent placement  05/2017  . CARPAL TUNNEL RELEASE Bilateral   . COLON SURGERY  2007   removed 18" of colon  . ERCP W/ METAL STENT PLACEMENT  11/2005   Archie Endo 12/14/2005  . ESOPHAGOGASTRODUODENOSCOPY (EGD) WITH ESOPHAGEAL DILATION     "several times'  . HAND SURGERY Bilateral    "plastic knuckles"  . LAPAROSCOPIC CHOLECYSTECTOMY  01/2006  . MELANOMA EXCISION     "back; left arm"  . NASAL SEPTUM SURGERY    . PELVIC FRACTURE SURGERY  1964   "busted in 2 places"  . RESECTION DISTAL CLAVICAL Right 05/13/2016   Procedure: DISTAL CLAVICLE EXCISION;  Surgeon: Ninetta Lights, MD;  Location: [facility];  Service: Orthopedics;  Laterality: Right;  .  SHOULDER ARTHROSCOPY WITH ROTATOR CUFF REPAIR AND SUBACROMIAL DECOMPRESSION Right 05/13/2016   Procedure: RIGHT SHOULDER SCOPE DEBRIDEMENT, ACROMIOPLASTY, ROTATOR CUFF REPAIR; RELEASE BICEPS TENDON AND DEBRIDEMENT LABRUM;  Surgeon: Ninetta Lights, MD;  Location: [facility];  Service: Orthopedics;  Laterality: Right;  . TIBIA FRACTURE SURGERY Left 2006   "put a steel rod in it"    Family History  Problem Relation Age of Onset  . Heart disease Mother   . Heart disease Father   . Cancer Maternal Grandmother     Social History   Tobacco Use  . Smoking status: Former Smoker    Packs/day: 1.00    Years: 46.00    Pack years: 46.00    Types: Cigarettes    Quit date: 11/26/2005    Years since quitting: 15.1  . Smokeless tobacco: Never Used  Substance Use Topics  . Alcohol use: Yes    Alcohol/week: 9.0 standard drinks    Types: 9 Shots of liquor per week    Comment: 05/23/2014 "3, 1 shot drinks maybe 3 times/wk"  . Drug use: No     Home Medications Prior to Admission medications   Medication Sig Start Date End Date Taking? Authorizing Provider  abatacept (ORENCIA) 250 MG injection Inject into the vein every 30 (thirty) days.     [provider]  aspirin 325 MG tablet Take 325 mg by mouth daily.     [provider]  atorvastatin (LIPITOR) 20 MG tablet Take 20 mg by mouth daily.     [provider]  cetirizine (ZYRTEC) 10 MG tablet Take by mouth.  05/21/20 05/21/21  [provider]  chlorhexidine (PERIDEX) 0.12 % solution  02/13/20   [provider]  clopidogrel (PLAVIX) 75 MG tablet Take 1 tablet by mouth daily.  01/31/20   [provider]  famotidine (PEPCID) 20 MG tablet  05/23/20   [provider]  fluticasone (FLONASE) 50 MCG/ACT nasal spray Place 2 sprays into the nose daily.     [provider]  folic acid (FOLVITE) 1 MG tablet Take 1 mg by mouth daily.  02/13/10   [provider]   irbesartan (AVAPRO) 150 MG tablet Take 150 mg by mouth daily.  07/26/19   [provider]  magnesium oxide (MAG-OX) 400 MG tablet Take 800 mg by mouth 3 (three) times daily.     [provider]  metFORMIN (GLUCOPHAGE) 500 MG tablet Take 500 mg by mouth daily with breakfast.     [provider]  methotrexate (RHEUMATREX) 2.5 MG tablet Take 2.5 mg by mouth once a week.     [provider]  methotrexate (RHEUMATREX) 2.5 MG tablet Take by mouth.  03/06/20 03/06/21  [provider]  Multiple Vitamin (MULTI-VITAMIN) tablet Take by mouth.     [provider]  omeprazole (PRILOSEC) 20 MG capsule Take 20 mg by mouth daily.  07/15/15   [provider]  oxyCODONE-acetaminophen (ROXICET) 5-325 MG tablet Take 1-2 tablets by mouth every 4 (four) hours as needed. 05/13/16   Aundra Dubin, PA-C  pantoprazole (PROTONIX) 20 MG tablet Take by mouth.  07/14/20 07/14/21  [provider]  potassium chloride (K-DUR) 10 MEQ tablet Take 10 mEq by mouth daily.  01/06/16   [provider]  predniSONE (DELTASONE) 5 MG tablet Take 5 mg by mouth daily with breakfast.     [provider]  tadalafil (CIALIS) 5 MG tablet Take 5 mg by mouth daily.     [provider]     Allergies    Patient has no known allergies.   Review of Systems   Review of Systems A comprehensive review of systems was completed and negative except as noted in HPI.    Physical Exam BP (!) 150/84   Pulse 100   Temp (!) 97.5 F (36.4 C) (Oral)   Resp 20   SpO2 100%   Physical Exam Vitals and nursing note reviewed.  Constitutional:      Appearance: Normal appearance.  HENT:     Head: Normocephalic and atraumatic.     Nose: Nose normal.     Mouth/Throat:     Mouth: Mucous membranes are dry.  Eyes:     Extraocular Movements: Extraocular movements intact.     Conjunctiva/sclera: Conjunctivae normal.  Cardiovascular:     Rate and Rhythm: Normal  rate.  Pulmonary:     Effort: Pulmonary effort is normal.     Breath sounds: Normal breath sounds.  Abdominal:     General: Abdomen is flat.     Palpations: Abdomen is soft.     Tenderness: There is no abdominal tenderness.  Musculoskeletal:        General: No swelling. Normal range of motion.     Cervical back: Neck supple.  Skin:    General:  Skin is warm and dry.  Neurological:     General: No focal deficit present.     Mental Status: He is alert.     Comments: Globally weak, no focal findings  Psychiatric:        Mood and Affect: Mood normal.      ED Results / Procedures / Treatments   Labs (all labs ordered are listed, but only abnormal results are displayed) Labs Reviewed  COMPREHENSIVE METABOLIC PANEL - Abnormal; Notable for the following components:      Result Value   CO2 17 (*)    Glucose, Bld 159 (*)    BUN 28 (*)    Creatinine, Ser 1.36 (*)    Calcium 8.2 (*)    Total Protein 6.0 (*)    Albumin 3.0 (*)    AST 56 (*)    GFR, Estimated 54 (*)    Anion gap 16 (*)    All other components within normal limits  CBC - Abnormal; Notable for the following components:   WBC 3.7 (*)    RBC 3.99 (*)    Hemoglobin 11.5 (*)    HCT 37.3 (*)    RDW 19.9 (*)    Platelets 91 (*)    All other components within normal limits  LACTIC ACID, PLASMA - Abnormal; Notable for the following components:   Lactic Acid, Venous 3.5 (*)    All other components within normal limits  D-DIMER, QUANTITATIVE (NOT AT [facility]) - Abnormal; Notable for the following components:   D-Dimer, Quant 2.65 (*)    All other components within normal limits  LACTATE DEHYDROGENASE - Abnormal; Notable for the following components:   LDH 304 (*)    All other components within normal limits  FERRITIN - Abnormal; Notable for the following components:   Ferritin 476 (*)    All other components within normal limits  FIBRINOGEN - Abnormal; Notable for the following components:   Fibrinogen 614 (*)    All  other components within normal limits  C-REACTIVE PROTEIN - Abnormal; Notable for the following components:   CRP 10.7 (*)    All other components within normal limits  POC SARS CORONAVIRUS 2 AG -  ED - Abnormal; Notable for the following components:   SARS Coronavirus 2 Ag POSITIVE (*)    All other components within normal limits  CULTURE, BLOOD (ROUTINE X 2)  CULTURE, BLOOD (ROUTINE X 2)  LIPASE, BLOOD  LACTIC ACID, PLASMA  PROCALCITONIN  TRIGLYCERIDES  URINALYSIS, ROUTINE W REFLEX MICROSCOPIC    EKG None  Radiology No results found.  Procedures Procedures  Medications Ordered in the ED Medications  sodium chloride 0.9 % bolus 1,000 mL (has no administration in time range)     MDM Rules/Calculators/A&P MDM Patient's Covid Ag test here is positive. CMP with signs of AKI compared to previous. Will give IVF, check remainder of Covid admission order set including CXR. Patient amenable to admission.   ED Course  I have reviewed the triage vital signs and the nursing notes.  Pertinent labs & imaging results that were available during my care of the patient were reviewed by me and considered in my medical decision making (see chart for details).  Clinical Course as of 01/26/21 1543  Mon Jan 26, 2021  1242 CXR concerning for PNA. No leukocytosis but his lactic acid is elevated. Will give a dose of Abx pending procalcitonin and culture results in case this is a bacterial infection on top of Covid.  [CS]  6 Spoke with IM resident who will evaluate the patient for admission.  [CS]    Clinical Course User Index [CS] Truddie Hidden, MD    Final Clinical Impression(s) / ED Diagnoses Final diagnoses:  Dehydration  AKI (acute kidney injury) ([place])  COVID-19    Rx / DC Orders ED Discharge Orders    None       Truddie Hidden, MD 01/26/21 [phone]

## 2021-01-26 NOTE — ED Notes (Addendum)
Pt changed and situated. Suction set up for pt. Three blankets placed on pt.

## 2021-01-27 ENCOUNTER — Other Ambulatory Visit: Payer: Self-pay | Admitting: Oncology

## 2021-01-27 ENCOUNTER — Encounter (HOSPITAL_COMMUNITY): Payer: Self-pay | Admitting: Internal Medicine

## 2021-01-27 DIAGNOSIS — U071 COVID-19: Principal | ICD-10-CM

## 2021-01-27 DIAGNOSIS — N179 Acute kidney failure, unspecified: Secondary | ICD-10-CM

## 2021-01-27 LAB — CBC
HCT: 27.6 % — ABNORMAL LOW (ref 39.0–52.0)
Hemoglobin: 8.7 g/dL — ABNORMAL LOW (ref 13.0–17.0)
MCH: 28.7 pg (ref 26.0–34.0)
MCHC: 31.5 g/dL (ref 30.0–36.0)
MCV: 91.1 fL (ref 80.0–100.0)
Platelets: 63 10*3/uL — ABNORMAL LOW (ref 150–400)
RBC: 3.03 MIL/uL — ABNORMAL LOW (ref 4.22–5.81)
RDW: 19.9 % — ABNORMAL HIGH (ref 11.5–15.5)
WBC: 2.9 10*3/uL — ABNORMAL LOW (ref 4.0–10.5)
nRBC: 0 % (ref 0.0–0.2)

## 2021-01-27 LAB — DIC (DISSEMINATED INTRAVASCULAR COAGULATION)PANEL
D-Dimer, Quant: 2.06 ug/mL-FEU — ABNORMAL HIGH (ref 0.00–0.50)
Fibrinogen: 454 mg/dL (ref 210–475)
INR: 1.4 — ABNORMAL HIGH (ref 0.8–1.2)
Platelets: 66 10*3/uL — ABNORMAL LOW (ref 150–400)
Prothrombin Time: 16.9 seconds — ABNORMAL HIGH (ref 11.4–15.2)
aPTT: 47 seconds — ABNORMAL HIGH (ref 24–36)

## 2021-01-27 LAB — CBC WITH DIFFERENTIAL/PLATELET
Abs Immature Granulocytes: 0.04 10*3/uL (ref 0.00–0.07)
Basophils Absolute: 0 10*3/uL (ref 0.0–0.1)
Basophils Relative: 0 %
Eosinophils Absolute: 0 10*3/uL (ref 0.0–0.5)
Eosinophils Relative: 1 %
HCT: 31.2 % — ABNORMAL LOW (ref 39.0–52.0)
Hemoglobin: 9.9 g/dL — ABNORMAL LOW (ref 13.0–17.0)
Immature Granulocytes: 1 %
Lymphocytes Relative: 5 %
Lymphs Abs: 0.2 10*3/uL — ABNORMAL LOW (ref 0.7–4.0)
MCH: 29 pg (ref 26.0–34.0)
MCHC: 31.7 g/dL (ref 30.0–36.0)
MCV: 91.5 fL (ref 80.0–100.0)
Monocytes Absolute: 0.2 10*3/uL (ref 0.1–1.0)
Monocytes Relative: 5 %
Neutro Abs: 3.3 10*3/uL (ref 1.7–7.7)
Neutrophils Relative %: 88 %
Platelets: 65 10*3/uL — ABNORMAL LOW (ref 150–400)
RBC: 3.41 MIL/uL — ABNORMAL LOW (ref 4.22–5.81)
RDW: 19.9 % — ABNORMAL HIGH (ref 11.5–15.5)
WBC: 3.7 10*3/uL — ABNORMAL LOW (ref 4.0–10.5)
nRBC: 0 % (ref 0.0–0.2)

## 2021-01-27 LAB — MAGNESIUM: Magnesium: 1.2 mg/dL — ABNORMAL LOW (ref 1.7–2.4)

## 2021-01-27 LAB — COMPREHENSIVE METABOLIC PANEL
ALT: 26 U/L (ref 0–44)
AST: 37 U/L (ref 15–41)
Albumin: 2.4 g/dL — ABNORMAL LOW (ref 3.5–5.0)
Alkaline Phosphatase: 64 U/L (ref 38–126)
Anion gap: 11 (ref 5–15)
BUN: 22 mg/dL (ref 8–23)
CO2: 18 mmol/L — ABNORMAL LOW (ref 22–32)
Calcium: 7.5 mg/dL — ABNORMAL LOW (ref 8.9–10.3)
Chloride: 110 mmol/L (ref 98–111)
Creatinine, Ser: 1.06 mg/dL (ref 0.61–1.24)
GFR, Estimated: >60 mL/min (ref >60)
Glucose, Bld: 109 mg/dL — ABNORMAL HIGH (ref 70–99)
Potassium: 3.3 mmol/L — ABNORMAL LOW (ref 3.5–5.1)
Sodium: 139 mmol/L (ref 135–145)
Total Bilirubin: 0.8 mg/dL (ref 0.3–1.2)
Total Protein: 4.7 g/dL — ABNORMAL LOW (ref 6.5–8.1)

## 2021-01-27 LAB — GLUCOSE, CAPILLARY
Glucose-Capillary: 96 mg/dL (ref 70–99)
Glucose-Capillary: 159 mg/dL — ABNORMAL HIGH (ref 70–99)
Glucose-Capillary: 121 mg/dL — ABNORMAL HIGH (ref 70–99)

## 2021-01-27 LAB — PHOSPHORUS: Phosphorus: 2.2 mg/dL — ABNORMAL LOW (ref 2.5–4.6)

## 2021-01-27 LAB — HEMOGLOBIN A1C
Hgb A1c MFr Bld: 6.7 % — ABNORMAL HIGH (ref 4.8–5.6)
Mean Plasma Glucose: 145.59 mg/dL

## 2021-01-27 LAB — SAVE SMEAR(SSMR), FOR PROVIDER SLIDE REVIEW

## 2021-01-27 LAB — C-REACTIVE PROTEIN: CRP: 7.3 mg/dL — ABNORMAL HIGH (ref <1.0)

## 2021-01-27 LAB — FERRITIN: Ferritin: 349 ng/mL — ABNORMAL HIGH (ref 24–336)

## 2021-01-27 LAB — LACTATE DEHYDROGENASE: LDH: 347 U/L — ABNORMAL HIGH (ref 98–192)

## 2021-01-27 LAB — D-DIMER, QUANTITATIVE: D-Dimer, Quant: 2.04 ug/mL-FEU — ABNORMAL HIGH (ref 0.00–0.50)

## 2021-01-27 MED ORDER — POTASSIUM CHLORIDE CRYS ER 20 MEQ PO TBCR
40.0000 meq | EXTENDED_RELEASE_TABLET | Freq: Once | ORAL | Status: AC
  Administered 2021-01-27: 40 meq via ORAL
  Filled 2021-01-27: qty 2

## 2021-01-27 MED ORDER — ALBUTEROL SULFATE HFA 108 (90 BASE) MCG/ACT IN AERS: 2.0000 | INHALATION_SPRAY | Freq: Four times a day (QID) | RESPIRATORY_TRACT | Status: DC | PRN

## 2021-01-27 MED ORDER — MAGNESIUM SULFATE 2 GM/50ML IV SOLN
2.0000 g | Freq: Three times a day (TID) | INTRAVENOUS | Status: AC
  Administered 2021-01-27 (×3): 2 g via INTRAVENOUS
  Filled 2021-01-27 (×3): qty 50

## 2021-01-27 MED ORDER — SODIUM PHOSPHATES 45 MMOLE/15ML IV SOLN
25.0000 mmol | Freq: Once | INTRAVENOUS | Status: AC
  Administered 2021-01-27: 25 mmol via INTRAVENOUS
  Filled 2021-01-27: qty 8.33

## 2021-01-27 MED ORDER — SODIUM CHLORIDE 0.9 % IV SOLN
2.0000 g | INTRAVENOUS | Status: DC
  Administered 2021-01-27 – 2021-01-28 (×2): 2 g via INTRAVENOUS
  Filled 2021-01-27 (×2): qty 20
  Filled 2021-01-27: qty 2

## 2021-01-27 NOTE — Plan of Care (Signed)
  Problem: Education: Goal: Knowledge of risk factors and measures for prevention of condition will improve Outcome: Progressing   Problem: Coping: Goal: Psychosocial and spiritual needs will be supported Outcome: Progressing   Problem: Respiratory: Goal: Will maintain a patent airway Outcome: Progressing Goal: Complications related to the disease process, condition or treatment will be avoided or minimized Outcome: Progressing   

## 2021-01-27 NOTE — Progress Notes (Addendum)
Subjective:  O/N Events: No events overnight.   Patient seen at bedside this morning. He states that he is aggravated as the room is loud and his mouth is dry. He states that he cannot eat the food as he is missing his dentures.   Objective:  Vital signs in last 24 hours: Vitals:   01/26/21 2102 01/26/21 2107 01/27/21 0015 01/27/21 0410  BP: 133/67  138/63 139/64  Pulse: 88  78 84  Resp: 18  19 19   Temp: 98 F (36.7 C)  98 F (36.7 C) 98.1 F (36.7 C)  TempSrc: Oral  Oral Oral  SpO2: 100%  98% 99%  Weight:  80.5 kg    Height:  6\' 2"  (1.88 m)     Physical Exam Cardiovascular:     Rate and Rhythm: Normal rate and regular rhythm.     Pulses: Normal pulses.     Heart sounds: Normal heart sounds. No murmur heard. No friction rub. No gallop.   Pulmonary:     Effort: Pulmonary effort is normal.     Breath sounds: Normal breath sounds. No wheezing or rales.  Abdominal:     General: Abdomen is flat. Bowel sounds are normal.     Palpations: Abdomen is soft.  Neurological:     General: No focal deficit present.     Mental Status: He is alert and oriented to person, place, and time.  Psychiatric:        Mood and Affect: Mood normal.        Behavior: Behavior normal.     CBC Latest Ref Rng & Units 01/27/2021 01/26/2021 05/28/2015  WBC 4.0 - 10.5 K/uL 3.7(L) 3.7(L) 11.7(H)  Hemoglobin 13.0 - 17.0 g/dL 9.9(L) 11.5(L) 11.4(L)  Hematocrit 39.0 - 52.0 % 31.2(L) 37.3(L) 33.5(L)  Platelets 150 - 400 K/uL 65(L) 91(L) 173   BMP Latest Ref Rng & Units 01/27/2021 01/26/2021 05/07/2016  Glucose 70 - 99 mg/dL 109(H) 159(H) 131(H)  BUN 8 - 23 mg/dL 22 28(H) 12  Creatinine 0.61 - 1.24 mg/dL 1.06 1.36(H) 0.98  Sodium 135 - 145 mmol/L 139 137 132(L)  Potassium 3.5 - 5.1 mmol/L 3.3(L) 3.6 3.8  Chloride 98 - 111 mmol/L 110 104 96(L)  CO2 22 - 32 mmol/L 18(L) 17(L) 24  Calcium 8.9 - 10.3 mg/dL 7.5(L) 8.2(L) 9.3    Assessment/Plan:  Active Problems:   COVID-19  Assessment & Plan by  Problem: Active Problems:   COVID-19   Mr. Colvin Blatt. 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   COVID-19 infection, active Pancytopenia Patient has not had any more diarrhea episodes since admission. Upon review of his CXR, his opacity appears more in line with his ILD, and with a reassuring procal, will discontinue his ceftriaxone. Patient was both leukocytopenic and thrombocytopenic on admission. His WBC is stable at 3.7, and his platelet count has decreased from  91->65. This could certainly be due to his COVID-19, but he is also on several immunosuppressants that can have these side effects. Some worry about other etiologies (HIT, ITP, DIC) Patient does not have any rash, and his vitals are stable which helps lower these etiologies on the differential. Will get dic panel to RO - DIC panel, haptoglobin - Discontinued azithromycin - Discontinue ceftriaxone - Daily CRP, D-dimer, Ferritin - Daily CBC with Diff - Daily CMP - Daily Mg and Phos - Robitussin 60mL Q4H PRN, Tussionex 56mL Q12H - Albuterol 2 puff Q6H - Airborne and contact precautions - Continuous pulse oximetry - Incentive spirometry, flutter valve  Recent Acute DVT/ PE - diagnosed during [facility] admission  12/30/20. COVID negative at that time. ?False negative -Continue Eliquis 5 mg BID   Interstitial lung disease, chronic -Continue to hold home nintedanib 2/2 to diarrhea -Consider pulmonology consultation   Recent left common femoral, tibial and peroneal DVTs and PE -Continue home apixaban 5mg  twice daily   HTN, chronic -Holding irbesartan in setting of AKI   T2DM, chronic Last HbA1c of 6.9. Patient takes metformin 1000mg  daily for this condition. Blood glucose only mildly elevated on initial labs, however in the setting of poor PO intake. 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 Cr of 1.06 -Continue LR at 100cc/hr -Monitor renal function on daily CMP    #VTE ppx: Continue home apixaban 5mg  twice daily #Diet: Soft, thin liquids #Code status: Full code #IVF: 100cc/hr Dispo: Admit patient to Inpatient with expected length of stay greater than 2 midnights.  Maudie Mercury, MD 01/27/2021, 5:50 AM Pager: [phone] After 5pm on weekdays and 1pm on weekends: On Call pager [phone]

## 2021-01-27 NOTE — Care Plan (Signed)
UPDATE NOTE:  DIC panel obtained earlier today due to acute vs subacute thrombocytopenia. PT, INR, PTT, D-dimer are mildly elevated and schistocytes present on smear. Haptoglobin, LDH in process.  Case discussed with Dr. Jana Hakim with hematology due to complex nature of being on 3 immunomodulating agents, eliquis for PE, and acute thrombocytopenia.  Plan -ADAMTS-13  -f/u haptoglobin, LDH -repeat CBC at 2100 tonight -hematology agreeable to see in AM.  -Bedside RN updated  Mitzi Hansen, MD Internal Medicine Resident PGY-2 [facility] Pager: [phone] 01/27/2021 6:03 PM

## 2021-01-27 NOTE — Progress Notes (Signed)
[name]   DOB:1946-08-06   VH#:[id number]   WUX#:[id number]  Subjective:  Met briefly with patient in his room. Did not do a physical exam given COVID positivity. Tried to explain that I do not believe he has TTP and does not need pheresis (see note below). I am afraid I may have confused rather than enlightened him   Objective: White man interviewed in bed Vitals:   01/27/21 0410 01/27/21 0800  BP: 139/64 123/63  Pulse: 84 83  Resp: 19 20  Temp: 98.1 F (36.7 C) 98.2 F (36.8 C)  SpO2: 99% 97%    Body mass index is 22.79 kg/m.  Intake/Output Summary (Last 24 hours) at 01/27/2021 1928 Last data filed at 01/27/2021 1623 Gross per 24 hour  Intake 1880.21 ml  Output 950 ml  Net 930.21 ml    Neuro: alert and conversant  CBG (last 3)  Recent Labs    01/27/21 0641 01/27/21 1110 01/27/21 1629  GLUCAP 96 159* 121*     Labs:  Lab Results  Component Value Date   WBC 3.7 (L) 01/27/2021   HGB 9.9 (L) 01/27/2021   HCT 31.2 (L) 01/27/2021   MCV 91.5 01/27/2021   PLT 66 (L) 01/27/2021   NEUTROABS 3.3 01/27/2021    @LASTCHEMISTRY @  Urine Studies No results for input(s): UHGB, CRYS in the last 72 hours.  Invalid input(s): UACOL, UAPR, USPG, UPH, UTP, UGL, UKET, UBIL, UNIT, UROB, ULEU, UEPI, UWBC, URBC, UBAC, CAST, UCOM, [place]  Basic Metabolic Panel: Recent Labs  Lab 01/26/21 0929 01/27/21 0427  NA 137 139  K 3.6 3.3*  CL 104 110  CO2 17* 18*  GLUCOSE 159* 109*  BUN 28* 22  CREATININE 1.36* 1.06  CALCIUM 8.2* 7.5*  MG  --  1.2*  PHOS  --  2.2*   GFR Estimated Creatinine Clearance: 68.6 mL/min (by C-G formula based on SCr of 1.06 mg/dL). Liver Function Tests: Recent Labs  Lab 01/26/21 0929 01/27/21 0427  AST 56* 37  ALT 36 26  ALKPHOS 83 64  BILITOT 0.9 0.8  PROT 6.0* 4.7*  ALBUMIN 3.0* 2.4*   Recent Labs  Lab 01/26/21 0929  LIPASE 47   No results for input(s): AMMONIA in the last 168 hours. Coagulation profile Recent Labs  Lab  01/27/21 1309  INR 1.4*    CBC: Recent Labs  Lab 01/26/21 0929 01/27/21 0427 01/27/21 1309  WBC 3.7* 3.7*  --   NEUTROABS  --  3.3  --   HGB 11.5* 9.9*  --   HCT 37.3* 31.2*  --   MCV 93.5 91.5  --   PLT 91* 65* 66*   Cardiac Enzymes: No results for input(s): CKTOTAL, CKMB, CKMBINDEX, TROPONINI in the last 168 hours. BNP: Invalid input(s): POCBNP CBG: Recent Labs  Lab 01/26/21 1652 01/27/21 0641 01/27/21 1110 01/27/21 1629  GLUCAP 98 96 159* 121*   D-Dimer Recent Labs    01/27/21 0427 01/27/21 1309  DDIMER 2.04* 2.06*   Hgb A1c Recent Labs    01/27/21 0427  HGBA1C 6.7*   Lipid Profile Recent Labs    01/26/21 1207  TRIG 130   Thyroid function studies No results for input(s): TSH, T4TOTAL, T3FREE, THYROIDAB in the last 72 hours.  Invalid input(s): FREET3 Anemia work up [employer]    01/26/21 1207 01/27/21 0427  FERRITIN 476* 349*   Microbiology Recent Results (from the past 240 hour(s))  Blood Culture (routine x 2)     Status: None (Preliminary result)  Collection Time: 01/26/21 12:07 PM   Specimen: BLOOD  Result Value Ref Range Status   Specimen Description BLOOD RIGHT ANTECUBITAL  Final   Special Requests   Final    BOTTLES DRAWN AEROBIC AND ANAEROBIC Blood Culture adequate volume   Culture   Final    NO GROWTH 1 DAY Performed at [facility], [place] [address], [place], [place] [address]    Report Status PENDING  Incomplete  Blood Culture (routine x 2)     Status: None (Preliminary result)   Collection Time: 01/26/21 12:50 PM   Specimen: BLOOD LEFT ARM  Result Value Ref Range Status   Specimen Description BLOOD LEFT ARM  Final   Special Requests   Final    BOTTLES DRAWN AEROBIC AND ANAEROBIC Blood Culture adequate volume   Culture   Final    NO GROWTH 1 DAY Performed at [facility], [place] [address], [place], [place] [address]    Report Status PENDING  Incomplete      Studies:  DG Chest Port 1 View  Result Date:  01/26/2021 CLINICAL DATA:  Shortness of breath COVID. EXAM: PORTABLE CHEST 1 VIEW COMPARISON:  December 30, 2013 FINDINGS: The heart size and mediastinal contours are within normal limits. Aortic atherosclerosis. Similar scarring versus atelectasis in the right lung base. Hazy left lower lobe opacity. No pleural effusion. No pneumothorax. Thoracic spondylosis and degenerative change of the bilateral shoulders. IMPRESSION: Hazy left lower lobe opacity, suspicious for pneumonia. Electronically Signed   By: Dahlia Bailiff MD   On: 01/26/2021 12:32    Assessment: 75 y.o. [place] man followed at [facility] by Dr Remer Macho for rheumatoid arthritis, treated with MTX in addition to abatacept and prednisone, complicated by interstitial lung disease, and history of PE (resolved on repeat CT angio 01/09/2021) on apixaban, now COVID 19 positive; we were consulted because of abnormal DIC panel showing schistocytes  REVIEW OF PERIPHERAL BLOOD FILM: 01/27/2021 White cells: polys and bands, hypogranulated; no lymphs;  Platelets: no artefactual clumps; appear moderately decreased RBCs: extensive rouleaux; anisocytosis with many crenated and cigar-shaped cells, occasional tailed poikylocytes, rare schistocytes (not classical)  Plan:  Overall the blood and overall picture is not suggestive of TTP. Would not initiate empiric pheresis. Would check ADAMTS13 as you are doing. Do not expect it to be diagnostically low.  Would proceed with DIC as the working diagnosis as you are doing. Would check with rheumatologist at [facility] with a view to reduce doses or temporarily d/c immunosuppressants as tolerated. Would consider ID consul;t in this very complex case.  He is followed by my partner Nolon Stalls in the [facility] oncology clinic and he should return to her care on d/c. I will follow peripherally while in house  Appreciate participating in the care of this complex patient    Chauncey Cruel, MD 01/27/2021  7:28 PM Medical  Oncology and Hematology [facility] [place], [place] [address] Tel. [phone]    Fax. [phone]

## 2021-01-27 NOTE — Progress Notes (Signed)
Plastic bag containing patients dentures dropped off by patients friend delivered to patient in his room [id number].

## 2021-01-28 DIAGNOSIS — R197 Diarrhea, unspecified: Secondary | ICD-10-CM

## 2021-01-28 DIAGNOSIS — D849 Immunodeficiency, unspecified: Secondary | ICD-10-CM

## 2021-01-28 DIAGNOSIS — D65 Disseminated intravascular coagulation [defibrination syndrome]: Secondary | ICD-10-CM

## 2021-01-28 DIAGNOSIS — J849 Interstitial pulmonary disease, unspecified: Secondary | ICD-10-CM | POA: Diagnosis not present

## 2021-01-28 DIAGNOSIS — D61818 Other pancytopenia: Secondary | ICD-10-CM

## 2021-01-28 DIAGNOSIS — U071 COVID-19: Secondary | ICD-10-CM | POA: Diagnosis not present

## 2021-01-28 DIAGNOSIS — M069 Rheumatoid arthritis, unspecified: Secondary | ICD-10-CM

## 2021-01-28 LAB — CBC WITH DIFFERENTIAL/PLATELET
Abs Immature Granulocytes: 0.02 10*3/uL (ref 0.00–0.07)
Basophils Absolute: 0 10*3/uL (ref 0.0–0.1)
Basophils Relative: 0 %
Eosinophils Absolute: 0 10*3/uL (ref 0.0–0.5)
Eosinophils Relative: 0 %
HCT: 28.2 % — ABNORMAL LOW (ref 39.0–52.0)
Hemoglobin: 8.9 g/dL — ABNORMAL LOW (ref 13.0–17.0)
Immature Granulocytes: 1 %
Lymphocytes Relative: 7 %
Lymphs Abs: 0.2 10*3/uL — ABNORMAL LOW (ref 0.7–4.0)
MCH: 28.8 pg (ref 26.0–34.0)
MCHC: 31.6 g/dL (ref 30.0–36.0)
MCV: 91.3 fL (ref 80.0–100.0)
Monocytes Absolute: 0.2 10*3/uL (ref 0.1–1.0)
Monocytes Relative: 6 %
Neutro Abs: 2.1 10*3/uL (ref 1.7–7.7)
Neutrophils Relative %: 86 %
Platelets: 64 10*3/uL — ABNORMAL LOW (ref 150–400)
RBC: 3.09 MIL/uL — ABNORMAL LOW (ref 4.22–5.81)
RDW: 19.6 % — ABNORMAL HIGH (ref 11.5–15.5)
WBC: 2.5 10*3/uL — ABNORMAL LOW (ref 4.0–10.5)
nRBC: 0 % (ref 0.0–0.2)

## 2021-01-28 LAB — COMPREHENSIVE METABOLIC PANEL
ALT: 25 U/L (ref 0–44)
AST: 45 U/L — ABNORMAL HIGH (ref 15–41)
Albumin: 2.2 g/dL — ABNORMAL LOW (ref 3.5–5.0)
Alkaline Phosphatase: 60 U/L (ref 38–126)
Anion gap: 11 (ref 5–15)
BUN: 14 mg/dL (ref 8–23)
CO2: 20 mmol/L — ABNORMAL LOW (ref 22–32)
Calcium: 7.5 mg/dL — ABNORMAL LOW (ref 8.9–10.3)
Chloride: 106 mmol/L (ref 98–111)
Creatinine, Ser: 0.96 mg/dL (ref 0.61–1.24)
GFR, Estimated: >60 mL/min (ref >60)
Glucose, Bld: 95 mg/dL (ref 70–99)
Potassium: 3.3 mmol/L — ABNORMAL LOW (ref 3.5–5.1)
Sodium: 137 mmol/L (ref 135–145)
Total Bilirubin: 0.7 mg/dL (ref 0.3–1.2)
Total Protein: 4.6 g/dL — ABNORMAL LOW (ref 6.5–8.1)

## 2021-01-28 LAB — GLUCOSE, CAPILLARY
Glucose-Capillary: 93 mg/dL (ref 70–99)
Glucose-Capillary: 170 mg/dL — ABNORMAL HIGH (ref 70–99)
Glucose-Capillary: 158 mg/dL — ABNORMAL HIGH (ref 70–99)
Glucose-Capillary: 152 mg/dL — ABNORMAL HIGH (ref 70–99)

## 2021-01-28 LAB — PHOSPHORUS: Phosphorus: 2.2 mg/dL — ABNORMAL LOW (ref 2.5–4.6)

## 2021-01-28 LAB — MAGNESIUM: Magnesium: 1.8 mg/dL (ref 1.7–2.4)

## 2021-01-28 LAB — HAPTOGLOBIN: Haptoglobin: 142 mg/dL (ref 34–355)

## 2021-01-28 LAB — C-REACTIVE PROTEIN: CRP: 7 mg/dL — ABNORMAL HIGH (ref <1.0)

## 2021-01-28 LAB — FERRITIN: Ferritin: 432 ng/mL — ABNORMAL HIGH (ref 24–336)

## 2021-01-28 LAB — D-DIMER, QUANTITATIVE: D-Dimer, Quant: 2.02 ug/mL-FEU — ABNORMAL HIGH (ref 0.00–0.50)

## 2021-01-28 MED ORDER — POTASSIUM PHOSPHATES 15 MMOLE/5ML IV SOLN
15.0000 mmol | Freq: Once | INTRAVENOUS | Status: AC
  Administered 2021-01-28: 15 mmol via INTRAVENOUS
  Filled 2021-01-28: qty 5

## 2021-01-28 MED ORDER — POTASSIUM CHLORIDE CRYS ER 20 MEQ PO TBCR
20.0000 meq | EXTENDED_RELEASE_TABLET | ORAL | Status: AC
Start: 2021-01-28 — End: 2021-01-28
  Administered 2021-01-28 (×2): 20 meq via ORAL
  Filled 2021-01-28 (×2): qty 1

## 2021-01-28 MED ORDER — SODIUM CHLORIDE 0.9 % IV SOLN
3.0000 g | Freq: Four times a day (QID) | INTRAVENOUS | Status: DC
  Administered 2021-01-28 – 2021-01-29 (×4): 3 g via INTRAVENOUS
  Filled 2021-01-28: qty 3
  Filled 2021-01-28: qty 8
  Filled 2021-01-28 (×2): qty 3
  Filled 2021-01-28 (×2): qty 8

## 2021-01-28 NOTE — Consult Note (Signed)
Please see our APP, [name]'s note for more detail   75 yo male with pad, aaa s/p aortic stent, hx melanoma s/p tx 2015, hx SCC base of tongue s/p tx in remission, RA, ILD, on immunosuppression (Nintedanib, weekly mtx, pred 5, monthly abatacept), recent 2 admissions 12/2020 for dvt/pe then aki/diarrhea, now admitted again 1/31 for diarrhea and fatigue/sorethroat found to have covid pcr positive along with relative pancytopenia  His last normal cbc as 01/11/2021; at that time mild lft elevation 1/4 admission dvt/pe placed on apixaban 1/14 admission for aki diarrhea thought to be OFEV (nintedanib) which was hold with resolved diarrhea  He has ild and has had progressive sob over at least the last several months. OFEV started 10/2020 unclear if has changed his sx.   Poor historian but possibly 2 weeks now worsening diarrhea. ?several days now with sorethroat and increased cough. Cough productive. Known ild/basilar traction bronchiectasis. Intermittent mild headache; has some now but no difference. No rash, myalgia, arthralgia, n/v, abd pain, chest pain, visual changes.  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 No pets/animals. No obvious sick contact exposure No recent travel  Since admisison, he has much improved sx. He is on room air. No fever. It appears his OFEV was hold.   He was also started on empiric ceftriaxone/azith for cxr "left lower lobe hazy opacity" and increased cough. procal 0.16.   Of particular concern which ID consulted was relative pancytopenia. Hematology involved. Smear with minimal schistocytes. Normal fibrinogen. Minimally elevated ldh. Team query if other infectious etiologies need to be w/u'ed   Vitals/labs/imaging reviewed Relatively stable moderate thrombocytopenia 60s, slight acute anemia hb 9, wbc mid 2's. Relative leukopenia. Per heme wbc is hypogranulated.   Exam: Well appearing male conversant, oriented, no distress, no cough noted, coarse bs/normal respiratory  effort, no rash, no synovitis (old ulnar deviation of fingers/wrist), nonfocal neuro exam    A/p Relative pancytopenia Diarrhea covid pcr positive RA/ILD on immunosuppression stable dosing Dvt/pe on apixaban AAA s/p aortic stent Hx melanoma and squamous cell cancer (of tongue) s/p tx in remission PAD Htn/hlp  covid infection unclear duration but likely 1-2 weeks. Had vaccine/booster previously. Sx mild Diarrhea ?ofev/covid. ofev hold here, sx improved. Previously had diarrhea/aki 1/14 stopped ofev with improvement. Defer to pulm for ofev management Relative pancytopenia with most severe cell line affected being thrombocytopenia. Smear unremarkable with mild schistocytes which is nonspecific. Could be due to covid infection. No obvious literature suggestion of toxicity of OFEV; MTx is rather low dose. Acute process seems to be related to positive covid pcr. At this time low suspicion for other infectious process. On Abatacept so at risk for disseminated histo and given poor historian and chronic nonspecific sx reasonable to check. Things like parvo mostly affected red blood cell but can do pancytopenia but can defer w/u for now unless much worse. Hepatitis not likely. ebv/cmv possible but lower on ddx given epidemiology and not typically the host we see it in. Non-hematologic w/u per primary team. Given dvt/pe could also consider PNH but that would be less likely as well  On prednisone chronically but sx improving without use of stress dose steroid, unlikely relative AI  -continue supportive care for covid  -send urine histo -he has bronchiectasis and with increased sputum production, reasonable to do short course abx; will defer to primary team -ofev management defered to pulmonary; no obvious contraindication from id standpoint at this time -reasonable to hold abatacept for now

## 2021-01-28 NOTE — Progress Notes (Signed)
Pharmacy Antibiotic Note  [name] is a 75 y.o. male admitted on 01/26/2021 with pneumonia.  Pharmacy has been consulted for Unasyn dosing.  Pt has been here for COVID. She has a hx of RA on abatacept. She was started on ceftriaxone but Unasyn has now been ordered. His renal has been stable. WBC low due to panytopenia.  Plan: Unasyn 3g IV q6  Height: 6\' 2"  (188 cm) Weight: 80.5 kg (177 lb 7.5 oz) IBW/kg (Calculated) : 82.2  Temp (24hrs), Avg:98 F (36.7 C), Min:98 F (36.7 C), Max:98 F (36.7 C)  Recent Labs  Lab 01/26/21 0929 01/26/21 1207 01/26/21 1500 01/27/21 0427 01/27/21 2110 01/28/21 0500  WBC 3.7*  --   --  3.7* 2.9* 2.5*  CREATININE 1.36*  --   --  1.06  --  0.96  LATICACIDVEN  --  3.5* 1.9  --   --   --     Estimated Creatinine Clearance: 75.7 mL/min (by C-G formula based on SCr of 0.96 mg/dL).    No Known Allergies  Antimicrobials this admission: 2/1 ceftriaxone>>2/2 2/1 unasyn>>  Dose adjustments this admission:   Microbiology results: 1/31 blood>>  Onnie Boer, PharmD, [place], AAHIVP, CPP Infectious Disease Pharmacist 01/28/2021 3:32 PM

## 2021-01-28 NOTE — Consult Note (Signed)
[facility] for Infectious Disease    Date of Admission:  01/26/2021     Total days of antibiotics 2   Ceftriaxone   Azithromycin         Reason for Consult: Pancytopenia, COVID+    Referring Provider: [facility]  Primary Care Provider: Elaina Pattee, MD   Assessment: [name] is a 75 y.o. male on immunosuppressive therapy admitted with diarrhea and weakness. He follows regularly with [facility] for primary and specialty care but came here due to concern for overall condition. On admission he had remarkable pancytopenia noted with Plt 95K (previously 241K on 1/15 at [facility]). Hematology also following - do not feel like this is TTP. Studies pending for ADAMTS-13, haptoglobin normal.   New pancytopenia present on admission. Certainly could be reactive in the setting of recent COVID illness. Reviewing current immune modulating therapies and history will also assess for possibility of histoplasmosis. No obvious opportunistic/infectious process suspected at this point.  Agree with holding home MTX/abatacept therapy. Would follow for now.   Diarrhea - better after holding nintedanib therapy. Will need outpatient follow up with regular pulmonology team for ongoing decisions in therapy. Does not favor infectious given correlation with dose / administration of nintedanib.   Regarding his COVID test - he seems pretty minimally symptomatic at this time, non-hypoxic and on room air. He states he had a sore throat a few weeks ago and there is documentation he went to local urgent care with a friend and found to have shortness of breath and hypoxia at that time in between hospitalizations at [facility] and SOB described at outpatient office visit 8 days ago. I think he has resolved acute COVID-19 illness based on symptom timeline I am reading in the chart. I would still do10-day isolation from positive test for him.  Agree with continuing course of antibiotics for CAP following COVID -  Augmentin would probably be a good choice given negative nasal MRSA PCR history and improvement on current therapy.       Plan: 1. Check urine histoplasma Ag  2. Follow pending tests     Principal Problem:   Pancytopenia ([place]) Active Problems:   Rheumatoid arthritis ([place])   COVID-19   AKI (acute kidney injury) ([place])   Immunosuppression ([place])   ILD (interstitial lung disease) ([place])   Diarrhea   . apixaban  5 mg Oral BID  . atorvastatin  20 mg Oral Daily  . clopidogrel  75 mg Oral Daily  . insulin aspart  0-9 Units Subcutaneous TID WC  . pantoprazole  40 mg Oral Daily  . potassium chloride  20 mEq Oral Q4H  . predniSONE  5 mg Oral Q breakfast    HPI: [name] is a 75 y.o. male admitted from home via EMS after severe diarrhea leading to weakness.   Of note he is on immune modulating therapies for ILD (nintedanib), rheumatoid arthritis (abatacept, methotrexate and prednisone 5 mg daily). S/P partial colectomy for diverticulitis.   We were asked to help evaluate for infectious process to explain pancytopenia present on admission.   He tells me he has been admitted to [facility] now 3 times. Recently with PE/DVT 12/30/2020 placed on apixaban. Admitted again 1/14 - 1/17 after seeing PCP for unresolved symptoms of diarrhea. His prescribed regimen for IDL with Ofev was decreased to 100 mg BID due to the diarrhea. Found to have +COVID test. States he had a sore throat a few weeks  ago now that has resolved. Cough is stable and baseline as to normal quality and productivity. No chest pain or difficulty breathing. He did receive the Moderna vaccines including a booster in late December 2021. Non-hypoxic here in the hospital. CXR reading more lobular opacity not diffuse viral process. He was started on IV ceftriaxone and Azithromycin. He states that his diarrhea has improved and "has not had a bowel movement in some time" stating the food we give him is bad and he cannot eat (although it  seems he ate 100% of his tray this morning).  Lives in [place] and works in [employer]. Traveled to [place] frequently of a 10-year period with last visit in 2008. Divorced. Has not been around any grandchildren in over a month. Unclear as to how long he has been on Orencia for RA management. He stopped smoking and drinking when he went through throat cancer in 2015.   His ability to outline a timeline of his health is quite challenging.    Review of Systems: Review of Systems  Constitutional: Positive for malaise/fatigue. Negative for chills, fever and weight loss.  HENT: Positive for sore throat (a few weeks ago, resolved now). Negative for congestion and tinnitus.   Eyes: Negative for blurred vision and double vision.  Cardiovascular: Negative for chest pain, orthopnea and leg swelling.  Gastrointestinal: Positive for diarrhea and nausea. Negative for abdominal pain and vomiting.  Genitourinary: Negative for dysuria.  Musculoskeletal: Negative for back pain, joint pain, myalgias and neck pain.  Skin: Negative for rash.  Neurological: Positive for weakness and headaches (intermittent, chronic unchanged, mild). Negative for focal weakness.    Past Medical History:  Diagnosis Date  . Aortic aneurysm ([place]) 05/2017  . Chronic back pain   . Diabetes mellitus without complication ([place])   . Dyslipidemia   . GERD (gastroesophageal reflux disease)   . H/O hiatal hernia   . Hypertension   . Melanoma ([place])    "back; left arm"  . PONV (postoperative nausea and vomiting)   . Rheumatoid arthritis ([place])   . Spasm of esophagus     Social History   Tobacco Use  . Smoking status: Former Smoker    Packs/day: 1.00    Years: 46.00    Pack years: 46.00    Types: Cigarettes    Quit date: 11/26/2005    Years since quitting: 15.1  . Smokeless tobacco: Never Used  Substance Use Topics  . Alcohol use: Yes    Alcohol/week: 9.0 standard drinks    Types: 9 Shots of liquor per  week    Comment: 05/23/2014 "3, 1 shot drinks maybe 3 times/wk"  . Drug use: No    Family History  Problem Relation Age of Onset  . Heart disease Mother   . Heart disease Father   . Cancer Maternal Grandmother    No Known Allergies  OBJECTIVE: Blood pressure 118/62, pulse 80, temperature 98 F (36.7 C), temperature source Oral, resp. rate 18, height 6\' 2"  (1.88 m), weight 80.5 kg, SpO2 99 %.  Physical Exam Constitutional:      Appearance: Normal appearance.     Comments: Resting in bed. No distress.   HENT:     Mouth/Throat:     Mouth: Mucous membranes are dry.     Pharynx: Oropharynx is clear.  Cardiovascular:     Rate and Rhythm: Normal rate and regular rhythm.     Heart sounds: No murmur heard.   Pulmonary:     Effort:  Pulmonary effort is normal.     Breath sounds: Normal breath sounds.     Comments: Cough demonstrated with tan/rusty sputum noted  Abdominal:     General: Bowel sounds are normal.     Palpations: Abdomen is soft.  Musculoskeletal:        General: No tenderness or deformity.     Cervical back: Normal range of motion. No tenderness.  Skin:    General: Skin is warm and dry.     Capillary Refill: Capillary refill takes less than 2 seconds.     Findings: No rash.  Neurological:     Mental Status: He is alert and oriented to person, place, and time.     Comments: Moves all extremities with equal bilateral strength 5/5 and good coordination.      Lab Results Lab Results  Component Value Date   WBC 2.5 (L) 01/28/2021   HGB 8.9 (L) 01/28/2021   HCT 28.2 (L) 01/28/2021   MCV 91.3 01/28/2021   PLT 64 (L) 01/28/2021    Lab Results  Component Value Date   CREATININE 0.96 01/28/2021   BUN 14 01/28/2021   NA 137 01/28/2021   K 3.3 (L) 01/28/2021   CL 106 01/28/2021   CO2 20 (L) 01/28/2021    Lab Results  Component Value Date   ALT 25 01/28/2021   AST 45 (H) 01/28/2021   ALKPHOS 60 01/28/2021   BILITOT 0.7 01/28/2021      Microbiology: Recent Results (from the past 240 hour(s))  Blood Culture (routine x 2)     Status: None (Preliminary result)   Collection Time: 01/26/21 12:07 PM   Specimen: BLOOD  Result Value Ref Range Status   Specimen Description BLOOD RIGHT ANTECUBITAL  Final   Special Requests   Final    BOTTLES DRAWN AEROBIC AND ANAEROBIC Blood Culture adequate volume   Culture   Final    NO GROWTH 2 DAYS Performed at [facility], [place] [address] [place], [place] [address]    Report Status PENDING  Incomplete  Blood Culture (routine x 2)     Status: None (Preliminary result)   Collection Time: 01/26/21 12:50 PM   Specimen: BLOOD LEFT ARM  Result Value Ref Range Status   Specimen Description BLOOD LEFT ARM  Final   Special Requests   Final    BOTTLES DRAWN AEROBIC AND ANAEROBIC Blood Culture adequate volume   Culture   Final    NO GROWTH 2 DAYS Performed at [facility], [place] [address] [place], [place] [address]    Report Status PENDING  Incomplete    Janene Madeira, MSN, NP-C [place] [facility] for Infectious [place] Cell: [phone] Pager: [phone]  01/28/2021 12:48 PM

## 2021-01-28 NOTE — Progress Notes (Addendum)
Subjective:  O/N Events: No acute events.   Patient states he is doing okay this morning. He states that he has seen many doctors today, and is doing fine. He is very happy to have his dentures back. Denies overnight fevers, nausea, rashes, abdominal pain, or chest pain.    Objective:  Vital signs in last 24 hours: Vitals:   01/27/21 0015 01/27/21 0410 01/27/21 0800 01/27/21 1936  BP: 138/63 139/64 123/63 121/62  Pulse: 78 84 83 78  Resp: 19 19 20 18   Temp: 98 F (36.7 C) 98.1 F (36.7 C) 98.2 F (36.8 C) 98 F (36.7 C)  TempSrc: Oral Oral Oral   SpO2: 98% 99% 97% 97%  Weight:      Height:       Physical Exam Constitutional:      Appearance: Normal appearance.     Comments: Answers questions appropriately, no acute distress, laying comfortably in bed.   HENT:     Head: Normocephalic and atraumatic.  Pulmonary:     Effort: Pulmonary effort is normal.     Comments: Breathing comfortably on RA, saturating 96-99% during interview.  Skin:    General: Skin is warm.     Coloration: Skin is not jaundiced.     Findings: No bruising, erythema, lesion or rash.  Neurological:     Mental Status: He is alert and oriented to person, place, and time. Mental status is at baseline.  Psychiatric:        Mood and Affect: Mood normal.        Behavior: Behavior normal.     CBC Latest Ref Rng & Units 01/27/2021 01/27/2021 01/27/2021  WBC 4.0 - 10.5 K/uL 2.9(L) - 3.7(L)  Hemoglobin 13.0 - 17.0 g/dL 8.7(L) - 9.9(L)  Hematocrit 39.0 - 52.0 % 27.6(L) - 31.2(L)  Platelets 150 - 400 K/uL 63(L) 66(L) 65(L)   BMP Latest Ref Rng & Units 01/27/2021 01/26/2021 05/07/2016  Glucose 70 - 99 mg/dL 109(H) 159(H) 131(H)  BUN 8 - 23 mg/dL 22 28(H) 12  Creatinine 0.61 - 1.24 mg/dL 1.06 1.36(H) 0.98  Sodium 135 - 145 mmol/L 139 137 132(L)  Potassium 3.5 - 5.1 mmol/L 3.3(L) 3.6 3.8  Chloride 98 - 111 mmol/L 110 104 96(L)  CO2 22 - 32 mmol/L 18(L) 17(L) 24  Calcium 8.9 - 10.3 mg/dL 7.5(L) 8.2(L) 9.3     Assessment/Plan:  Principal Problem:   COVID-19 Active Problems:   AKI (acute kidney injury) ([place])  Assessment & Plan by Problem: Active Problems:   COVID-19 [name]. 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  Pancytopenia/DIC in the Setting of COVID 19  Overnight CBC showed stable platelet count, and a continued slight drop in the other cell lines. Heme was consulted yesterday, they recommended continuing to check the ADAMTS13, but the clinical picture does not classically fit with TTP, but labs align  with DIC with platelets stable in the 60s. ID was consulted today to help r/o other infectious agents in the immunosuppressed patient.  Inflammatory markers are Continuing to downtrend. Breathing comfortably on RA, COVID setting mild.  - Appreciate Hematology's recommendations - Appreciate ID's recommendations  - Will start Unasyn per ID recommendations.   - Histoplasma Ag - DIC panel showed slightly elevated labs - Trend inflammatory markers - Daily CBC with Diff - Trend CMP - Daily Mg and Phos - Robitussin 74mL Q4H PRN, Tussionex 17mL Q12H - Albuterol 2 puff Q6H - Airborne and contact precautions - Continuous pulse oximetry - Incentive spirometry,  flutter valve  Recent Acute DVT/ PE: -Continue Eliquis 5 mg BID   Interstitial lung disease, chronic -Continue to hold home nintedanib 2/2 to diarrhea -Consider pulmonology consultation   Recent left common femoral, tibial and peroneal DVTs and PE -Continue home apixaban 5mg  twice daily   HTN, chronic -Holding irbesartan in setting of AKI   T2DM, chronic Last HbA1c of 6.9. Patient takes metformin 1000mg  daily for this condition. Blood glucose only mildly elevated on initial labs, however in the setting of poor PO intake. 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 Cr of 0.96 -Continue LR at 100cc/hr -Monitor renal function on daily CMP    #VTE ppx: Continue home apixaban 5mg  twice daily #Diet: Soft, thin liquids #Code status: Full code Dispo: Admit patient to Inpatient with expected length of stay greater than 2 midnights.  Maudie Mercury, MD 01/28/2021, 5:41 AM Pager: [phone] After 5pm on weekdays and 1pm on weekends: On Call pager [phone]

## 2021-01-28 NOTE — Progress Notes (Signed)
Internal Medicine Attending Note:  Patient doing well this morning, mental status improved from yesterday. He is more alert and able to carry on conversation. Discussed COVID-19 diagnosis and blood work suggestive of possible DIC. Diarrhea has resolved while holding nintedanib.   Blood pressure 118/62, pulse 80, temperature 98 F (36.7 C), temperature source Oral, resp. rate 18, height 6\' 2"  (1.88 m), weight 80.5 kg, SpO2 99 %. Physical Exam Constitutional: NAD, appears comfortable Pulmonary/Chest: Normal effort, saturating 99% on RA Extremities: Warm and well perfused. No edema.  Neurological: A&Ox3, CN II - XII grossly intact.  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   Pancytopenia / DIC Appreciate hematology and ID recommendations. ADAMTS13 is pending but felt unlikely to be TTP per hematology. Most likely DIC 2/2 to his COVID-19 infection. ID has been consulted as well to ensure we are not missing other infectious etiologies with his immunocompromised status. Clinically he appears well and his labs today are stable. Checking urine histoplasma Ag per ID recs (risk of disseminated history given treatment with abatacept). Otherwise will continue to monitor. Further work up if worsening DIC.    COVID-19 Infection Possible superimposed CAP  Hx of ILD - holding nintedanib  Mild symptoms, no oxygen requirement. Will add back antibiotics per ID recs for possible superimposed CAP. Otherwise no treatment for his COVID-19 infection.   AKI: Resolved  DVT / PE Diagnosed during recent admission to [place]. Continue eliqius 5 mg BID.   Rheumatoid Arthritis: Continue pred 5 mg daily. Holding MTX and abatacept.    Type II DM: SSI-sensitive TID Montine Circle, MD 01/28/2021, 1:29 PM

## 2021-01-29 DIAGNOSIS — D849 Immunodeficiency, unspecified: Secondary | ICD-10-CM | POA: Diagnosis not present

## 2021-01-29 DIAGNOSIS — D61818 Other pancytopenia: Secondary | ICD-10-CM | POA: Diagnosis not present

## 2021-01-29 DIAGNOSIS — J849 Interstitial pulmonary disease, unspecified: Secondary | ICD-10-CM | POA: Diagnosis not present

## 2021-01-29 DIAGNOSIS — U071 COVID-19: Secondary | ICD-10-CM | POA: Diagnosis not present

## 2021-01-29 LAB — COMPREHENSIVE METABOLIC PANEL
ALT: 27 U/L (ref 0–44)
AST: 53 U/L — ABNORMAL HIGH (ref 15–41)
Albumin: 2.2 g/dL — ABNORMAL LOW (ref 3.5–5.0)
Alkaline Phosphatase: 57 U/L (ref 38–126)
Anion gap: 11 (ref 5–15)
BUN: 12 mg/dL (ref 8–23)
CO2: 20 mmol/L — ABNORMAL LOW (ref 22–32)
Calcium: 7.6 mg/dL — ABNORMAL LOW (ref 8.9–10.3)
Chloride: 105 mmol/L (ref 98–111)
Creatinine, Ser: 0.9 mg/dL (ref 0.61–1.24)
GFR, Estimated: >60 mL/min (ref >60)
Glucose, Bld: 115 mg/dL — ABNORMAL HIGH (ref 70–99)
Potassium: 3.7 mmol/L (ref 3.5–5.1)
Sodium: 136 mmol/L (ref 135–145)
Total Bilirubin: 0.6 mg/dL (ref 0.3–1.2)
Total Protein: 4.5 g/dL — ABNORMAL LOW (ref 6.5–8.1)

## 2021-01-29 LAB — D-DIMER, QUANTITATIVE: D-Dimer, Quant: 1.96 ug/mL-FEU — ABNORMAL HIGH (ref 0.00–0.50)

## 2021-01-29 LAB — CBC WITH DIFFERENTIAL/PLATELET
Abs Immature Granulocytes: 0.01 10*3/uL (ref 0.00–0.07)
Basophils Absolute: 0 10*3/uL (ref 0.0–0.1)
Basophils Relative: 0 %
Eosinophils Absolute: 0 10*3/uL (ref 0.0–0.5)
Eosinophils Relative: 1 %
HCT: 28.2 % — ABNORMAL LOW (ref 39.0–52.0)
Hemoglobin: 9.3 g/dL — ABNORMAL LOW (ref 13.0–17.0)
Immature Granulocytes: 0 %
Lymphocytes Relative: 8 %
Lymphs Abs: 0.2 10*3/uL — ABNORMAL LOW (ref 0.7–4.0)
MCH: 29.7 pg (ref 26.0–34.0)
MCHC: 33 g/dL (ref 30.0–36.0)
MCV: 90.1 fL (ref 80.0–100.0)
Monocytes Absolute: 0.1 10*3/uL (ref 0.1–1.0)
Monocytes Relative: 5 %
Neutro Abs: 2.3 10*3/uL (ref 1.7–7.7)
Neutrophils Relative %: 86 %
Platelets: 68 10*3/uL — ABNORMAL LOW (ref 150–400)
RBC: 3.13 MIL/uL — ABNORMAL LOW (ref 4.22–5.81)
RDW: 19.7 % — ABNORMAL HIGH (ref 11.5–15.5)
WBC: 2.7 10*3/uL — ABNORMAL LOW (ref 4.0–10.5)
nRBC: 0 % (ref 0.0–0.2)

## 2021-01-29 LAB — GLUCOSE, CAPILLARY
Glucose-Capillary: 92 mg/dL (ref 70–99)
Glucose-Capillary: 121 mg/dL — ABNORMAL HIGH (ref 70–99)
Glucose-Capillary: 167 mg/dL — ABNORMAL HIGH (ref 70–99)
Glucose-Capillary: 144 mg/dL — ABNORMAL HIGH (ref 70–99)

## 2021-01-29 LAB — ADAMTS13 ACTIVITY: Adamts 13 Activity: 51.3 % — ABNORMAL LOW (ref >66.8)

## 2021-01-29 LAB — FERRITIN: Ferritin: 397 ng/mL — ABNORMAL HIGH (ref 24–336)

## 2021-01-29 LAB — C-REACTIVE PROTEIN: CRP: 8.4 mg/dL — ABNORMAL HIGH (ref <1.0)

## 2021-01-29 LAB — ADAMTS13 ACTIVITY REFLEX

## 2021-01-29 LAB — MAGNESIUM: Magnesium: 1.5 mg/dL — ABNORMAL LOW (ref 1.7–2.4)

## 2021-01-29 LAB — PHOSPHORUS: Phosphorus: 2.2 mg/dL — ABNORMAL LOW (ref 2.5–4.6)

## 2021-01-29 MED ORDER — MAGNESIUM SULFATE 2 GM/50ML IV SOLN
2.0000 g | Freq: Once | INTRAVENOUS | Status: AC
  Administered 2021-01-29: 2 g via INTRAVENOUS
  Filled 2021-01-29: qty 50

## 2021-01-29 MED ORDER — AMOXICILLIN-POT CLAVULANATE 875-125 MG PO TABS
1.0000 | ORAL_TABLET | Freq: Two times a day (BID) | ORAL | Status: DC
  Administered 2021-01-29 – 2021-01-30 (×4): 1 via ORAL
  Filled 2021-01-29 (×4): qty 1

## 2021-01-29 MED ORDER — K PHOS MONO-SOD PHOS DI & MONO 155-852-130 MG PO TABS
250.0000 mg | ORAL_TABLET | Freq: Two times a day (BID) | ORAL | Status: AC
  Administered 2021-01-29 – 2021-01-30 (×4): 250 mg via ORAL
  Filled 2021-01-29 (×4): qty 1

## 2021-01-29 MED ORDER — NINTEDANIB ESYLATE 100 MG PO CAPS: 100.0000 mg | ORAL_CAPSULE | Freq: Every day | ORAL | Status: DC

## 2021-01-29 NOTE — Progress Notes (Signed)
[name]ious Disease  Date of Admission:  01/26/2021         A/p: Relative pancytopenia Diarrhea covid pcr positive RA/ILD on immunosuppression stable dosing Dvt/pe on apixaban AAA s/p aortic stent Hx melanoma and squamous cell cancer (of tongue) s/p tx in remission PAD Htn/hlp  covid infection unclear duration but likely 1-2 weeks. Had vaccine/booster previously. Sx mild Diarrhea ?ofev/covid. ofev hold here, sx improved. Previously had diarrhea/aki 1/14 stopped ofev with improvement. Defer to pulm for ofev management Relative pancytopenia with most severe cell line affected being thrombocytopenia. Smear unremarkable with mild schistocytes which is nonspecific. Could be due to covid infection. No obvious literature suggestion of toxicity of OFEV; MTx is rather low dose. Acute process seems to be related to covid infection. At this time low suspicion for other infectious process. On Abatacept so at risk for disseminated histo and given poor historian and chronic nonspecific sx reasonable to check. Things like parvo mostly affected red blood cell but can do pancytopenia but can defer w/u for now unless much worse. Hepatitis not likely. ebv/cmv possible but lower on ddx given epidemiology and not typically the host we see it in. Non-hematologic w/u per primary team. Given dvt/pe could also consider PNH but that would be less likely as well  On prednisone chronically but sx improving without use of stress dose steroid, unlikely relative AI  ----------- 2/03 assessment So far diarrhea improved with holding OFEV here Mild covid sx but query involvement with pancytopenia. Urine histo pending. Low suspicion for cmv/ebv. Labs Improving slightly today  Still feels tired pendign pt evaluation  -continue supportive care for covid  -f/u urine histo (we can f/u as outpatient) -finish 7 day abx for bronchiectasis exacerbation. 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 apixaban  5 mg Oral BID  . atorvastatin  20 mg Oral Daily  . clopidogrel  75 mg Oral Daily  . insulin aspart  0-9 Units Subcutaneous TID WC  . pantoprazole  40 mg Oral Daily  . phosphorus  250 mg Oral BID  . predniSONE  5 mg Oral Q breakfast   Continuous Infusions: . ampicillin-sulbactam (UNASYN) IV 3 g (01/29/21 0921)   PRN Meds:.acetaminophen, albuterol, chlorpheniramine-HYDROcodone, guaiFENesin-dextromethorphan   SUBJECTIVE: Afebrile Some cough still Feels tired No diarrhea   Review of Systems: ROS Other ros negative  No Known Allergies  OBJECTIVE: Vitals:   01/28/21 1200 01/28/21 1500 01/28/21 2050 01/28/21 2111  BP: (!) 106/57 115/63  105/61  Pulse:   81   Resp: (!) 22 20 20 20   Temp: 98 F (36.7 C)  98 F (36.7 C) 98 F (36.7 C)  TempSrc:   Oral Oral  SpO2: 93% 95% 96% 95%  Weight:      Height:       Body mass index is 22.79 kg/m.  Physical Exam No distress, conversant, some cough/hacking Heent: normocephalic; conj clear; poor dentition Neck supple cv rrr no mrg Lungs coarse bs abd s/nt Ext no edema Skin no rash Neuro generalized weakness strength symmetric  4-5/5; nonfocal otherwise Psych alert/oriented  Lab Results Lab Results  Component Value Date   WBC 2.7 (L) 01/29/2021   HGB 9.3 (L) 01/29/2021   HCT 28.2 (L) 01/29/2021   MCV 90.1 01/29/2021   PLT 68 (L) 01/29/2021    Lab Results  Component Value Date   CREATININE 0.90 01/29/2021   BUN 12 01/29/2021   NA 136 01/29/2021   K 3.7 01/29/2021   CL 105 01/29/2021   CO2  20 (L) 01/29/2021    Lab Results  Component Value Date   ALT 27 01/29/2021   AST 53 (H) 01/29/2021   ALKPHOS 57 01/29/2021   BILITOT 0.6 01/29/2021     Microbiology: Recent Results (from the past 240 hour(s))  Blood Culture (routine x 2)     Status: None (Preliminary result)   Collection Time: 01/26/21 12:07 PM   Specimen: BLOOD  Result Value Ref Range Status   Specimen Description BLOOD RIGHT ANTECUBITAL  Final   Special Requests   Final    BOTTLES DRAWN AEROBIC AND ANAEROBIC Blood Culture adequate volume   Culture   Final    NO GROWTH 3 DAYS Performed at [facility], [address], [place], [place] [address]    Report Status PENDING  Incomplete  Blood Culture (routine x 2)     Status: None (Preliminary result)   Collection Time: 01/26/21 12:50 PM   Specimen: BLOOD LEFT ARM  Result Value Ref Range Status   Specimen Description BLOOD LEFT ARM  Final   Special Requests   Final    BOTTLES DRAWN AEROBIC AND ANAEROBIC Blood Culture adequate volume   Culture   Final    NO GROWTH 3 DAYS Performed at [facility], [place] [address] [place], [place] [address]    Report Status PENDING  Incomplete   Serology: 2/02 urine histoplasma ag in progress  Micro: 1/31 bcx negative  Imaging: 1/31 cxr Reviewed Hazy left lower lobe opacity  Jabier Mutton, [place] [facility] for [facility] [place] [phone] pager    01/29/2021, 10:07 AM

## 2021-01-29 NOTE — Progress Notes (Addendum)
Subjective: HD#3   Overnight: No acute events reported  Today, [name] was somewhat flustered today because he states that he is not been able to get enough rest due to constant interruption.  Otherwise, he denies chest pain, shortness of breath, bleeding or any sequelae of DIC.  We made him aware that we will be consulting physical therapy for evaluation prior to discharge and he was agreeable with the plan.  He did state that he was not ready to discharge.  The team did make me aware that he was somewhat active yesterday, sitting in bed, more conversational than today.  I am not sure if his lack of sleep could be playing a role here.   Objective:  Vital signs in last 24 hours: Vitals:   01/28/21 1200 01/28/21 1500 01/28/21 2050 01/28/21 2111  BP: (!) 106/57 115/63  105/61  Pulse:   81   Resp: (!) 22 20 20 20   Temp: 98 F (36.7 C)  98 F (36.7 C) 98 F (36.7 C)  TempSrc:   Oral Oral  SpO2: 93% 95% 96% 95%  Weight:      Height:       Const: In no apparent distress, lying comfortably in bed, conversational, Resp: CTA BL, no wheezes, crackles, rhonchi CV: RRR, no murmurs, gallop, rub Skin: No evidence of petechiae.  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  DIC in the Setting of COVID 19  No evidence of DIC sequelae.  WBC stable persistent lymphopenia. - Appreciate Hematology's recommendations  *Ok to discharge since WBC stable   *Close follow up with PCP and repeat CBC in 1 week - Daily CBC with Diff - Trend CMP - Daily Mg and Phos - Robitussin 53mL Q4H PRN, Tussionex 41mL Q12H - Albuterol 2 puff Q6H - Airborne and contact precautions - Continuous pulse oximetry - Incentive spirometry, flutter valve.  COVID-19 Infection Superimposed CAP vs Aspiration pneumonia Vital signs stable, lungs clear to auscultation -Switch Unasyn to Augmentin to complete 7-day course  - Follow-up Histoplasma Ag  Recent Acute DVT/ PE: -Continue Eliquis 5 mg BID  Interstitial lung disease, chronic I believe it is okay to resume his nintedanib to his prior dose of 100 mg daily as he has no evidence of diarrhea and also this medication does not cause any hematologic derangement. 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

## 2021-01-29 NOTE — Evaluation (Signed)
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 +COVID with pna, AKI, DIC  Clinical Impression   Pt admitted with above diagnosis. Patient not feeling well with difficulty explaining how he feels. He appears labored with his breathing at rest and with minimal activity with sats 95% on room air. Patient showing decr orientation (year 2020 despite cues) and decr awareness of safety with mobility (impulsive). 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 He currently required min guard assist to stand at EOB (due to imbalance/impulsivity) and did not feel he could walk (again appearing labored).  Pt currently with functional limitations due to the deficits listed below (see PT Problem List). Pt will benefit from skilled PT to increase their independence and safety with mobility to allow discharge to the venue listed below. PT will plan to see pt again 2/4 to assist with discharge planning.      Follow Up Recommendations No PT follow up;Supervision - Intermittent    Equipment Recommendations  None recommended by PT    Recommendations for Other Services OT consult     Precautions / Restrictions Precautions Precautions: Fall Precaution Comments: denies h/o falls;      Mobility  Bed Mobility Overal bed mobility: Needs Assistance Bed Mobility: Supine to Sit;Sit to Supine     Supine to sit: Min  guard Sit to supine: Supervision   General bed mobility comments: stating he didn't feel well enough to sit up EOB and then popping up to EOB unexpectedly with gown entangled around him    Transfers Overall transfer level: Needs assistance Equipment used: None Transfers: Sit to/from Stand Sit to Stand: Min guard         General transfer comment: pt again stating he didn't feel well enough to stand and then popping up to standing with wide BOS and gown falling to the floor  Ambulation/Gait             General Gait Details: pt refused stating he didn't feel right and felt too weak  Stairs            Wheelchair Mobility    Modified Rankin (Stroke Patients Only)       Balance Overall balance assessment: Mild deficits observed, not formally tested                                           Pertinent Vitals/Pain Pain Assessment: No/denies pain    Home Living Family/patient expects to be discharged to:: Private residence Living Arrangements: Alone Available Help at Discharge: Friend(s);Available PRN/intermittently Type of Home: House Home Access: Level entry     Home Layout: One level Home Equipment: None      Prior Function Level of Independence: Independent         Comments: 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, newspaper        Extremity/Trunk  Assessment   Upper Extremity Assessment Upper Extremity Assessment: Generalized weakness    Lower Extremity Assessment Lower Extremity Assessment: Generalized weakness    Cervical / Trunk Assessment Cervical / Trunk Assessment: Normal  Communication   Communication: No difficulties  Cognition Arousal/Alertness: Awake/alert Behavior During Therapy: Flat affect;Impulsive Overall Cognitive Status: Impaired/Different from baseline Area of Impairment: Orientation;Following commands;Safety/judgement;Awareness;Problem solving                  Orientation Level: Time (2020...even when cued "last year was 2021")     Following Commands: Follows one step commands inconsistently;Follows one step commands with increased time Safety/Judgement: Decreased awareness of safety;Decreased awareness of deficits Awareness: Intellectual Problem Solving: Slow processing;Difficulty sequencing;Requires verbal cues General Comments: pt stating he can't do something and then impulsively trying (ex sit to stand) while tangled in his hospital gown with decr awareness; slow processing      General Comments      Exercises     Assessment/Plan    PT Assessment Patient needs continued PT services  PT Problem List Decreased strength;Decreased activity tolerance;Decreased balance;Decreased mobility;Decreased cognition;Decreased safety awareness;Cardiopulmonary status limiting activity       PT Treatment Interventions Gait training;Functional mobility training;Therapeutic activities;Therapeutic exercise;Balance training;Cognitive remediation;Patient/family education    PT Goals (Current goals can be found in the Care Plan section)  Acute Rehab PT Goals Patient Stated Goal: find out what's wrong--not feeling well PT Goal Formulation: With patient Time For Goal Achievement: 02/12/21 Potential to Achieve Goals: Good    Frequency Min 3X/week   Barriers to discharge Decreased caregiver support lives alone    Co-evaluation               AM-PAC PT "6 Clicks" Mobility  Outcome Measure Help needed turning from your back to your side while in a flat bed without using bedrails?: None Help needed moving from lying on your back to sitting on the side of a flat bed without using bedrails?: A Little Help needed moving to and from a bed to a chair (including a wheelchair)?: A Little Help needed standing up from a chair using your arms (e.g., wheelchair or bedside chair)?: A Little Help needed to walk in hospital room?: A Little Help needed  climbing 3-5 steps with a railing? : A Lot 6 Click Score: 18    End of Session   Activity Tolerance: Patient limited by fatigue Patient left: in bed;with call bell/phone within reach Nurse Communication: Mobility status;Other (comment) (appears labored with breathing) PT Visit Diagnosis: Unsteadiness on feet (R26.81);Difficulty in walking, not elsewhere classified (R26.2)    Time: 3903-0092 PT Time Calculation (min) (ACUTE ONLY): 30 min   Charges:   PT Evaluation $PT Eval Moderate Complexity: 1 Mod           Arby Barrette, PT Pager [phone]   Rexanne Mano 01/29/2021, 3:55 PM

## 2021-01-30 DIAGNOSIS — E119 Type 2 diabetes mellitus without complications: Secondary | ICD-10-CM

## 2021-01-30 DIAGNOSIS — K5900 Constipation, unspecified: Secondary | ICD-10-CM

## 2021-01-30 DIAGNOSIS — I82409 Acute embolism and thrombosis of unspecified deep veins of unspecified lower extremity: Secondary | ICD-10-CM

## 2021-01-30 DIAGNOSIS — I10 Essential (primary) hypertension: Secondary | ICD-10-CM

## 2021-01-30 LAB — MAGNESIUM: Magnesium: 1.5 mg/dL — ABNORMAL LOW (ref 1.7–2.4)

## 2021-01-30 LAB — COMPREHENSIVE METABOLIC PANEL
ALT: 28 U/L (ref 0–44)
AST: 52 U/L — ABNORMAL HIGH (ref 15–41)
Albumin: 2.2 g/dL — ABNORMAL LOW (ref 3.5–5.0)
Alkaline Phosphatase: 57 U/L (ref 38–126)
Anion gap: 8 (ref 5–15)
BUN: 10 mg/dL (ref 8–23)
CO2: 24 mmol/L (ref 22–32)
Calcium: 7.9 mg/dL — ABNORMAL LOW (ref 8.9–10.3)
Chloride: 106 mmol/L (ref 98–111)
Creatinine, Ser: 0.78 mg/dL (ref 0.61–1.24)
GFR, Estimated: >60 mL/min (ref >60)
Glucose, Bld: 108 mg/dL — ABNORMAL HIGH (ref 70–99)
Potassium: 3.6 mmol/L (ref 3.5–5.1)
Sodium: 138 mmol/L (ref 135–145)
Total Bilirubin: 0.5 mg/dL (ref 0.3–1.2)
Total Protein: 4.7 g/dL — ABNORMAL LOW (ref 6.5–8.1)

## 2021-01-30 LAB — CBC WITH DIFFERENTIAL/PLATELET
Abs Immature Granulocytes: 0.02 10*3/uL (ref 0.00–0.07)
Basophils Absolute: 0 10*3/uL (ref 0.0–0.1)
Basophils Relative: 0 %
Eosinophils Absolute: 0 10*3/uL (ref 0.0–0.5)
Eosinophils Relative: 1 %
HCT: 26.8 % — ABNORMAL LOW (ref 39.0–52.0)
Hemoglobin: 9 g/dL — ABNORMAL LOW (ref 13.0–17.0)
Immature Granulocytes: 1 %
Lymphocytes Relative: 8 %
Lymphs Abs: 0.2 10*3/uL — ABNORMAL LOW (ref 0.7–4.0)
MCH: 30 pg (ref 26.0–34.0)
MCHC: 33.6 g/dL (ref 30.0–36.0)
MCV: 89.3 fL (ref 80.0–100.0)
Monocytes Absolute: 0.2 10*3/uL (ref 0.1–1.0)
Monocytes Relative: 6 %
Neutro Abs: 2.4 10*3/uL (ref 1.7–7.7)
Neutrophils Relative %: 84 %
Platelets: 65 10*3/uL — ABNORMAL LOW (ref 150–400)
RBC: 3 MIL/uL — ABNORMAL LOW (ref 4.22–5.81)
RDW: 19.5 % — ABNORMAL HIGH (ref 11.5–15.5)
WBC: 2.9 10*3/uL — ABNORMAL LOW (ref 4.0–10.5)
nRBC: 0 % (ref 0.0–0.2)

## 2021-01-30 LAB — GLUCOSE, CAPILLARY
Glucose-Capillary: 91 mg/dL (ref 70–99)
Glucose-Capillary: 144 mg/dL — ABNORMAL HIGH (ref 70–99)
Glucose-Capillary: 131 mg/dL — ABNORMAL HIGH (ref 70–99)
Glucose-Capillary: 139 mg/dL — ABNORMAL HIGH (ref 70–99)

## 2021-01-30 LAB — PHOSPHORUS: Phosphorus: 2.7 mg/dL (ref 2.5–4.6)

## 2021-01-30 LAB — D-DIMER, QUANTITATIVE: D-Dimer, Quant: 1.59 ug/mL-FEU — ABNORMAL HIGH (ref 0.00–0.50)

## 2021-01-30 MED ORDER — ENSURE ENLIVE PO LIQD
237.0000 mL | Freq: Three times a day (TID) | ORAL | Status: DC
  Administered 2021-01-30 – 2021-02-04 (×11): 237 mL via ORAL

## 2021-01-30 MED ORDER — ADULT MULTIVITAMIN W/MINERALS CH
1.0000 | ORAL_TABLET | Freq: Every day | ORAL | Status: DC
  Administered 2021-01-30 – 2021-02-05 (×7): 1 via ORAL
  Filled 2021-01-30 (×7): qty 1

## 2021-01-30 MED ORDER — SENNOSIDES-DOCUSATE SODIUM 8.6-50 MG PO TABS
1.0000 | ORAL_TABLET | Freq: Every day | ORAL | Status: DC
  Administered 2021-01-30 – 2021-01-31 (×2): 1 via ORAL
  Filled 2021-01-30 (×2): qty 1

## 2021-01-30 MED ORDER — MAGNESIUM SULFATE 2 GM/50ML IV SOLN
2.0000 g | Freq: Once | INTRAVENOUS | Status: AC
  Administered 2021-01-30: 2 g via INTRAVENOUS
  Filled 2021-01-30: qty 50

## 2021-01-30 MED ORDER — PROSOURCE PLUS PO LIQD
30.0000 mL | Freq: Two times a day (BID) | ORAL | Status: DC
  Administered 2021-01-30 – 2021-02-04 (×10): 30 mL via ORAL
  Filled 2021-01-30 (×10): qty 30

## 2021-01-30 NOTE — Progress Notes (Signed)
Pharmacy Antibiotic Note  [name] is a 75 y.o. male admitted on 01/26/2021 with pneumonia.  Pharmacy has been consulted for Unasyn dosing.  Pt has been here for COVID. She has a hx of RA on abatacept. ILD -resuming lower dose Ofev. His renal has been stable. WBC low due to panytopenia. Tmax 99.2.   Plan: Continue Unasyn 3g IV q6  Height: 6\' 2"  (188 cm) Weight: 80.5 kg (177 lb 7.5 oz) IBW/kg (Calculated) : 82.2  Temp (24hrs), Avg:99.2 F (37.3 C), Min:99.2 F (37.3 C), Max:99.2 F (37.3 C)  Recent Labs  Lab 01/26/21 0929 01/26/21 1207 01/26/21 1500 01/27/21 0427 01/27/21 2110 01/28/21 0500 01/29/21 0214 01/30/21 0227  WBC 3.7*  --   --  3.7* 2.9* 2.5* 2.7* 2.9*  CREATININE 1.36*  --   --  1.06  --  0.96 0.90 0.78  LATICACIDVEN  --  3.5* 1.9  --   --   --   --   --     Estimated Creatinine Clearance: 90.8 mL/min (by C-G formula based on SCr of 0.78 mg/dL).    No Known Allergies  Antimicrobials this admission: 2/1 ceftriaxone>>2/2 2/1 unasyn>>  Dose adjustments this admission:   Microbiology results: 1/31 blood>>  Sloan Leiter, PharmD, BCPS, BCCCP Clinical Pharmacist Please refer to [facility] for [place] numbers 01/30/2021 8:19 AM

## 2021-01-30 NOTE — Progress Notes (Signed)
Subjective: HD#4   No acute events overnight.  During evaluation at bedside this morning, patient states he is still feeling "draggy" and low energy, though reports feeling slightly better compared to yesterday. Denies diarrhea. During our conversation, he began to cough aggressively, productive of tan-gray sputum, and his oxygen saturation dropped from low 90s to low 80s sustained. Notified RN to place supplemental oxygen.  Spoke with patient's son, [name], on the phone. He reports the patient's Ofev was dropped off when the patient was still in the ED. He expresses concern that the patient would possibly go home without a [place] aide or PT.  Objective:  Vital signs in last 24 hours: Vitals:   01/28/21 2050 01/28/21 2111 01/29/21 1551 01/29/21 2110  BP:  105/61 121/62 134/63  Pulse: 81  71 86  Resp: 20 20 15 18   Temp: 98 F (36.7 C) 98 F (36.7 C)    TempSrc: Oral Oral  Oral  SpO2: 96% 95% 90% 92%  Weight:      Height:       Physical exam  Const: Tired and chronically-ill appearing, sitting up in bedside chair, coughs repeatedly as above Resp: Cough productive of tan/grey sputum, breathing appears more labored after coughing episode, oxygen saturation drops to low-80s on room air Abdominal: flat, non-distended Psych: flat affect  Assessment/Plan:  Principal Problem:   DIC (disseminated intravascular coagulation) ([facility]) Active Problems:   Rheumatoid arthritis ([place])   COVID-19   AKI (acute kidney injury) ([place])   Pancytopenia ([place])   Immunosuppression ([place])   ILD (interstitial lung disease) ([place])   Diarrhea  Mr. Frazer [place]. 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  This is hospital day 4.  DIC in the Setting of COVID 19  No evidence of DIC sequelae.  WBC stable, persistent lymphopenia. - Appreciate Hematology's recommendations  - Ok to discharge from a hematology standpoint given WBC stable   - Close follow up with PCP and repeat CBC in 1 week - Daily CBC with Diff - Trend CMP - Daily Mg and Phos  - Mg 1.5, repleted with 2 g IV Mg sulfate - Robitussin 27mL Q4H PRN, Tussionex 23mL Q12H - Albuterol 2 puff Q6H - Airborne and contact precautions - Continuous pulse oximetry - Incentive spirometry, flutter valve.  COVID-19 Infection Superimposed CAP vs Aspiration pneumonia Patient with sustained desaturation to low 80s after coughing episode during evaluation this morning, placed on 2 L supplemental oxygen. - Continue Augmentin, abx day 5/7 - Follow-up Histoplasma Ag (collected 01/28/21)  Recent Acute DVT/ PE: -Continue Eliquis 5 mg BID  Interstitial lung disease, chronic Will resume patient's home nintedanib to his prior dose of 100 mg daily as his diarrhea has resolved and this medication does not have hematologic side effects. - Restart nintendanib (Ofev) - spoke with patient's son who reports the medication was dropped off while the  patient was still in the ED. However, the medication does not appear to have made it to the floor. Will discuss with ED pharmacy.  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

## 2021-01-30 NOTE — Progress Notes (Addendum)
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 +COVID with pna, AKI, DIC    PT Comments    Pt admitted with above diagnosis. Pt was able to ambulate needing to hold onto IV pole for support. Will need RW for balance. Also needeed O2 to keep sats >81% on RA.  Pt was not confused today and did not appear impulsive either. Will follow acutely.  Pt currently with functional limitations due to balance and endurance deficits. Pt will benefit from skilled PT to increase their independence and safety with mobility to allow discharge to the venue listed below.     Follow Up Recommendations  Supervision - Intermittent;Home health PT     Equipment Recommendations  Rolling walker with 5" wheels , home O2   Recommendations for Other Services OT consult     Precautions / Restrictions Precautions Precautions: Fall Precaution Comments: denies h/o falls    Mobility  Bed Mobility Overal bed mobility: Needs Assistance Bed Mobility: Supine to Sit;Sit to Supine     Supine to sit: Min guard Sit to supine: Min guard   General bed mobility comments: No assist needed  Transfers Overall transfer level: Needs assistance Equipment used: None Transfers: Sit to/from Stand Sit to Stand: Min guard         General transfer comment: Able to stand and walk in room wiht RW.  Ambulation/Gait Ambulation/Gait assistance: Min guard Gait  Distance (Feet): 65 Feet Assistive device: IV Pole Gait Pattern/deviations: Step-through pattern;Decreased stride length   Gait velocity interpretation: <1.31 ft/sec, indicative of household ambulator General Gait Details: Pt walked in the room.  Pt did desaturate on RA and took a few min to recover sats once in chair. Pt definitely reaching for furniture and IV pole.   Stairs             Wheelchair Mobility    Modified Rankin (Stroke Patients Only)       Balance Overall balance assessment: Mild deficits observed, not formally tested                                          Cognition Arousal/Alertness: Awake/alert Behavior During Therapy: Flat affect;Impulsive Overall Cognitive Status: Impaired/Different from baseline Area of Impairment: Following commands;Safety/judgement;Awareness;Problem solving                       Following Commands: Follows one step commands inconsistently;Follows one step commands with increased time Safety/Judgement: Decreased awareness of safety;Decreased awareness of deficits Awareness: Intellectual Problem Solving: Slow processing;Difficulty sequencing;Requires verbal cues        Exercises Other Exercises Other Exercises: Marched in place Other Exercises: Pt practiced incentive spirometer as well.    General Comments General comments (skin integrity, edema, etc.): Intiail BP 133/57, then BP sitting 71/39 asymptomatic; 112/57 oncein chair 3 min; Desat to 81% with actiivyt and 85% once in chair on RA. 89% after 5 min rest.  HR 100-104 bpm      Pertinent Vitals/Pain Pain Assessment: No/denies pain    Home Living                      Prior Function            PT Goals (current goals can now be found in the care plan section) Acute Rehab PT Goals Patient Stated Goal: find out what's wrong--not feeling well Progress towards PT goals: Progressing toward goals    Frequency    Min  3X/week      PT Plan Current plan remains appropriate    Co-evaluation              AM-PAC PT "6 Clicks" Mobility   Outcome Measure  Help needed turning from your back to your side while in a flat bed without using bedrails?: None Help needed moving from lying on your back to sitting on the side of a flat bed without using bedrails?: A Little Help needed moving to and from a bed to a chair (including a wheelchair)?: A Little Help needed standing up from a chair using your arms (e.g., wheelchair or bedside chair)?: A Little Help needed to walk in hospital room?: A Little Help needed climbing 3-5 steps with a railing? : A Lot 6 Click Score: 18    End of Session Equipment Utilized During Treatment: Gait belt;Oxygen Activity Tolerance: Patient limited by fatigue Patient left: with call bell/phone within reach;in chair;with chair alarm set Nurse Communication: Mobility status;Other (comment) (appears labored with breathing) PT Visit Diagnosis: Unsteadiness on feet (R26.81);Difficulty in walking, not elsewhere classified (R26.2)     Time: 5366-4403 PT Time Calculation (min) (ACUTE ONLY): 33 min  Charges:  $Gait Training: 23-37 mins                     Jaycee Pelzer W,PT [place] Pager:  [phone]  Office:  [place] 01/30/2021, 1:58 PM

## 2021-01-30 NOTE — Progress Notes (Signed)
Initial Nutrition Assessment  DOCUMENTATION CODES:   Not applicable  INTERVENTION:   Ensure Enlive po TID, each supplement provides 350 kcal and 20 grams of protein  24ml Prosource Plus po BID, each supplement provides 100 kcals and 15 grams of protein  MVI with minerals daily   NUTRITION DIAGNOSIS:   Inadequate oral intake related to poor appetite as evidenced by per patient/family report.    GOAL:   Patient will meet greater than or equal to 90% of their needs    MONITOR:   PO intake,Supplement acceptance,Labs,Weight trends,I & O's  REASON FOR ASSESSMENT:   Consult Assessment of nutrition requirement/status  ASSESSMENT:   Pt admitted with AKI and DIC in the setting of COVID-19 infection. PMH includes RA complicated by interstitial lung disease, SCC of base of tongue, melanoma, type 2 DM complicated by peripheral neuropathy, PAD, AAA L iliac artery aneurysm s/p EVAR with iliac branch device, R ICA stenosis 2/2 XRT for SCC of tongue, esophageal spasm s/p botox, h/o diverticulitis s/p partial colectomy, recent DVTs and PE.  Pt reports having poor appetite/intake, diarrhea, and generalized weakness for the past several weeks. Diarrhea has improved since admission per pt and RN.    Reviewed weight history. Pt weighed 91.3 kg on 07/31/20 and weighed 80.5 kg when admitted on 01/26/21. This indicates an 11.6% wt loss x5 months, which is significant for timeframe. Suspect pt meets criteria for malnutrition given significant wt loss and reports of poor intake; however, unable to diagnose at this time without nutrition-focused physical exam.   No PO intake documented.   UOP: 427ml documented x24 hours  Labs: Mg 1.5 (L), CBGs 91-144 Medications: ss novolog TID w/ meals, K Phos neutral, deltasone  NUTRITION - FOCUSED PHYSICAL EXAM: Unable to perform at this time. Will attempt at follow-up.  Diet Order:   Diet Order            Diet regular Room service appropriate? Yes;  Fluid consistency: Thin  Diet effective now                 EDUCATION NEEDS:   No education needs have been identified at this time  Skin:  Skin Assessment: Reviewed RN Assessment  Last BM:  PTA  Height:   Ht Readings from Last 1 Encounters:  01/26/21 6\' 2"  (1.88 m)    Weight:   Wt Readings from Last 1 Encounters:  01/26/21 80.5 kg   BMI:  Body mass index is 22.79 kg/m.  Estimated Nutritional Needs:   Kcal:  2100-2300  Protein:  105-115 grams  Fluid:  >2L    Larkin Ina, MS, RD, LDN RD pager number and weekend/on-call pager number located in [place].

## 2021-01-30 NOTE — Progress Notes (Signed)
IMTS Progress Note:  Notified by pharmacy that they were able to obtain his bottle of Ofev, although note the 150mg  tablets do not correlate with the 100mg  BID dosing that he was recently reduced to by [place].   He previously obtained this medication through the [facility], although this was switched to [place], although the pharmacy states patient has not called to establish care through them so they have been unable to ship this medication to his house.  [facility] Number:  [id number]  Tried calling number from pharmacy: [phone] although this was [facility] line and they stated to try above number.   - Recommend calling this number with patient in the room on morning rounds given patient's history of poor compliance if patient is to stay in the hospital until Monday morning when pharmacy re-opens. Unable to fill this order to patient's home on weekend.   - Patient has close friends who may be able to bring prescription to patient while admitted if he stays next week.   Jeralyn Bennett, MD 01/30/2021, 9:32 PM Pager: [phone]

## 2021-01-30 NOTE — Progress Notes (Signed)
SATURATION QUALIFICATIONS: (This note is used to comply with regulatory documentation for home oxygen)  Patient Saturations on Room Air at Rest = 88%  Patient Saturations on Room Air while Ambulating = 81%  Patient Saturations on 3 Liters of oxygen while Ambulating = 88%  Please briefly explain why patient needs home oxygen:Pt desaturaates on RA with activity. May need home O2.  Leeah Politano W,PT Acute Rehabilitation Services Pager:  [phone]  Office:  [phone]

## 2021-01-31 LAB — CBC WITH DIFFERENTIAL/PLATELET
Abs Immature Granulocytes: 0.01 10*3/uL (ref 0.00–0.07)
Basophils Absolute: 0 10*3/uL (ref 0.0–0.1)
Basophils Relative: 0 %
Eosinophils Absolute: 0 10*3/uL (ref 0.0–0.5)
Eosinophils Relative: 0 %
HCT: 26.1 % — ABNORMAL LOW (ref 39.0–52.0)
Hemoglobin: 8.7 g/dL — ABNORMAL LOW (ref 13.0–17.0)
Immature Granulocytes: 0 %
Lymphocytes Relative: 7 %
Lymphs Abs: 0.2 10*3/uL — ABNORMAL LOW (ref 0.7–4.0)
MCH: 29.7 pg (ref 26.0–34.0)
MCHC: 33.3 g/dL (ref 30.0–36.0)
MCV: 89.1 fL (ref 80.0–100.0)
Monocytes Absolute: 0.3 10*3/uL (ref 0.1–1.0)
Monocytes Relative: 8 %
Neutro Abs: 2.7 10*3/uL (ref 1.7–7.7)
Neutrophils Relative %: 85 %
Platelets: 71 10*3/uL — ABNORMAL LOW (ref 150–400)
RBC: 2.93 MIL/uL — ABNORMAL LOW (ref 4.22–5.81)
RDW: 19.7 % — ABNORMAL HIGH (ref 11.5–15.5)
WBC: 3.2 10*3/uL — ABNORMAL LOW (ref 4.0–10.5)
nRBC: 0 % (ref 0.0–0.2)

## 2021-01-31 LAB — COMPREHENSIVE METABOLIC PANEL
ALT: 31 U/L (ref 0–44)
AST: 53 U/L — ABNORMAL HIGH (ref 15–41)
Albumin: 2.1 g/dL — ABNORMAL LOW (ref 3.5–5.0)
Alkaline Phosphatase: 58 U/L (ref 38–126)
Anion gap: 8 (ref 5–15)
BUN: 15 mg/dL (ref 8–23)
CO2: 24 mmol/L (ref 22–32)
Calcium: 7.8 mg/dL — ABNORMAL LOW (ref 8.9–10.3)
Chloride: 103 mmol/L (ref 98–111)
Creatinine, Ser: 0.9 mg/dL (ref 0.61–1.24)
GFR, Estimated: >60 mL/min (ref >60)
Glucose, Bld: 128 mg/dL — ABNORMAL HIGH (ref 70–99)
Potassium: 3.7 mmol/L (ref 3.5–5.1)
Sodium: 135 mmol/L (ref 135–145)
Total Bilirubin: 0.5 mg/dL (ref 0.3–1.2)
Total Protein: 4.7 g/dL — ABNORMAL LOW (ref 6.5–8.1)

## 2021-01-31 LAB — CULTURE, BLOOD (ROUTINE X 2)
Culture: NO GROWTH
Culture: NO GROWTH
Special Requests: ADEQUATE
Special Requests: ADEQUATE

## 2021-01-31 LAB — GLUCOSE, CAPILLARY
Glucose-Capillary: 94 mg/dL (ref 70–99)
Glucose-Capillary: 105 mg/dL — ABNORMAL HIGH (ref 70–99)
Glucose-Capillary: 176 mg/dL — ABNORMAL HIGH (ref 70–99)
Glucose-Capillary: 196 mg/dL — ABNORMAL HIGH (ref 70–99)

## 2021-01-31 LAB — MAGNESIUM: Magnesium: 1.8 mg/dL (ref 1.7–2.4)

## 2021-01-31 LAB — D-DIMER, QUANTITATIVE: D-Dimer, Quant: 1.45 ug/mL-FEU — ABNORMAL HIGH (ref 0.00–0.50)

## 2021-01-31 LAB — PHOSPHORUS: Phosphorus: 3 mg/dL (ref 2.5–4.6)

## 2021-01-31 MED ORDER — DEXAMETHASONE 6 MG PO TABS
6.0000 mg | ORAL_TABLET | Freq: Every day | ORAL | Status: DC
  Administered 2021-01-31 – 2021-02-05 (×6): 6 mg via ORAL
  Filled 2021-01-31: qty 1
  Filled 2021-01-31: qty 2
  Filled 2021-01-31: qty 1
  Filled 2021-01-31 (×2): qty 2
  Filled 2021-01-31: qty 1

## 2021-01-31 NOTE — Progress Notes (Addendum)
NAME:  [name], MRN:  [id number], DOB:  1946-11-11, LOS: 5 ADMISSION DATE:  01/26/2021  Subjective  Patient evaluated at bedside this AM. States he is breathing okay, doesn't feel like he needs supplemental oxygen. Continues to have productive cough, says the amount of phlegm he is currently having is more than normal. [name] also inquired about isolation dates and when he will be able to discharge. He also mentions some constipation.  Objective   Blood pressure (!) 122/59, pulse 94, temperature 98 F (36.7 C), temperature source Oral, resp. rate (!) 22, height 6\' 2"  (1.88 m), weight 80.5 kg, SpO2 93 %.    No intake or output data in the 24 hours ending 01/31/21 0511 Filed Weights   01/26/21 2107  Weight: 80.5 kg   Physical Exam: General: Chronically ill-appearing. No acute distress Pulm: Productive cough present. No increased work of breathing. Clear to auscultation bilaterally. Abd: Soft, non-distended, non-tender. Psych: Normal affect, mood.  Labs    CBC Latest Ref Rng & Units 01/31/2021 01/30/2021 01/29/2021  WBC 4.0 - 10.5 K/uL 3.2(L) 2.9(L) 2.7(L)  Hemoglobin 13.0 - 17.0 g/dL 8.7(L) 9.0(L) 9.3(L)  Hematocrit 39.0 - 52.0 % 26.1(L) 26.8(L) 28.2(L)  Platelets 150 - 400 K/uL 71(L) 65(L) 68(L)   BMP Latest Ref Rng & Units 01/31/2021 01/30/2021 01/29/2021  Glucose 70 - 99 mg/dL 128(H) 108(H) 115(H)  BUN 8 - 23 mg/dL 15 10 12   Creatinine 0.61 - 1.24 mg/dL 0.90 0.78 0.90  Sodium 135 - 145 mmol/L 135 138 136  Potassium 3.5 - 5.1 mmol/L 3.7 3.6 3.7  Chloride 98 - 111 mmol/L 103 106 105  CO2 22 - 32 mmol/L 24 24 20(L)  Calcium 8.9 - 10.3 mg/dL 7.8(L) 7.9(L) 7.6(L)   Consults:  Infectious disease Hematology  Significant Diagnostic Tests:  CXR 1/31 > Possible LLL opacity  Micro Data:  BCx 1/31 > NGTD COVID-19 1/31 > Positive  Summary  [name] is 75yo person with interstitial lung disease and rheumatoid arthritis on immunosuppressive medications, 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  Assessment & Plan:  Principal Problem:   DIC (disseminated intravascular coagulation) ([name]) Active Problems:   Rheumatoid arthritis ([name])   COVID-19   AKI (acute kidney injury) ([name])   Pancytopenia ([name])   Immunosuppression ([name])   ILD (interstitial lung disease) ([name])   Diarrhea  #COVID-19 Infection Patient has had desaturation to 80's over the last two days during interview. At rest patient seems to oxygenate well but quickly desats once he starts to move around. Given he has had five days of antibiotics, believe his respiratory symptoms are 2/2 COVID-19 infection in setting of ILD. Will stop antibiotics at this time. With his oxygen requirement will start Decadron daily with hopes of decreasing inflammatory response from infection. - D/c Augmentin - Start Decadron 6mg  daily - C/w Robitussin q4h PRN, Tussionex q12h - Albuterol 2 puffs q6h - Incentive spirometry, flutter valve - Trend CMP - Daily CBC, Mg, Phos  #Pancytopenia #DIC in setting of COVID-19 Hematology consulted earlier in admission, labs consistent with DIC, although clinically no DIC sequelae. Cell lines are persistent, no acute changes.  Per hematology's recommendations, patient is stable for discharge  and should be followed closely by PCP w/ repeat CBC 1 week after discharge.  - F/u w/ PCP - CBC 1 week s/p discharge  #Interstitial lung disease Patient on Ofev at home. Previously thought to be causing diarrhea during previous hospitalization at [facility]. At that time, reportedly decreased dose from 150mg  to 100mg . Appears patient has been taking  150mg  at home. [facility], but was unable to discuss with anyone. Will try to call Monday. Please see Dr. Lenis Johnson note from 2/4 for more information. - Continue with nintedanib 100mg  qd  #Constipation Patient mentions he has not had a BM in a few days. Was started on Senokot-S yesterday, will continue and add laxatives as needed. - C/w Senokot-S qhs  Chronic: #Recent DVTs - Continue with Eliquis 5mg  BID  #Rheumatoid arthritis Will continue with prednisone 5mg  daily. - Holding methotrexate, abatacept  #Hypertension Holding home ARB, patient has been normotensive and stable.  Best practice:  DIET: Regular IVF: n/a DVT PPX: home Eliquis BOWEL: Senokot-S CODE: FULL FAM COM: n/a  Sanjuan Dame, MD Internal Medicine Resident PGY-1 PAGER: [phone] 01/31/2021 5:11 AM  If after hours (below), please contact on-call pager: [phone] 5PM-7AM Monday-Friday 1PM-7AM Saturday-Sunday

## 2021-02-01 LAB — BASIC METABOLIC PANEL
Anion gap: 8 (ref 5–15)
BUN: 18 mg/dL (ref 8–23)
CO2: 25 mmol/L (ref 22–32)
Calcium: 8.1 mg/dL — ABNORMAL LOW (ref 8.9–10.3)
Chloride: 104 mmol/L (ref 98–111)
Creatinine, Ser: 0.89 mg/dL (ref 0.61–1.24)
GFR, Estimated: >60 mL/min (ref >60)
Glucose, Bld: 202 mg/dL — ABNORMAL HIGH (ref 70–99)
Potassium: 4 mmol/L (ref 3.5–5.1)
Sodium: 137 mmol/L (ref 135–145)

## 2021-02-01 LAB — DIFFERENTIAL
Abs Immature Granulocytes: 0.01 10*3/uL (ref 0.00–0.07)
Basophils Absolute: 0 10*3/uL (ref 0.0–0.1)
Basophils Relative: 0 %
Eosinophils Absolute: 0 10*3/uL (ref 0.0–0.5)
Eosinophils Relative: 0 %
Immature Granulocytes: 1 %
Lymphocytes Relative: 13 %
Lymphs Abs: 0.2 10*3/uL — ABNORMAL LOW (ref 0.7–4.0)
Monocytes Absolute: 0.1 10*3/uL (ref 0.1–1.0)
Monocytes Relative: 12 %
Neutro Abs: 0.9 10*3/uL — ABNORMAL LOW (ref 1.7–7.7)
Neutrophils Relative %: 74 %

## 2021-02-01 LAB — GLUCOSE, CAPILLARY
Glucose-Capillary: 157 mg/dL — ABNORMAL HIGH (ref 70–99)
Glucose-Capillary: 189 mg/dL — ABNORMAL HIGH (ref 70–99)
Glucose-Capillary: 220 mg/dL — ABNORMAL HIGH (ref 70–99)
Glucose-Capillary: 210 mg/dL — ABNORMAL HIGH (ref 70–99)

## 2021-02-01 LAB — CBC
HCT: 26.1 % — ABNORMAL LOW (ref 39.0–52.0)
Hemoglobin: 8.8 g/dL — ABNORMAL LOW (ref 13.0–17.0)
MCH: 30.1 pg (ref 26.0–34.0)
MCHC: 33.7 g/dL (ref 30.0–36.0)
MCV: 89.4 fL (ref 80.0–100.0)
Platelets: 91 10*3/uL — ABNORMAL LOW (ref 150–400)
RBC: 2.92 MIL/uL — ABNORMAL LOW (ref 4.22–5.81)
RDW: 19.2 % — ABNORMAL HIGH (ref 11.5–15.5)
WBC: 1.3 10*3/uL — CL (ref 4.0–10.5)
nRBC: 0 % (ref 0.0–0.2)

## 2021-02-01 MED ORDER — POLYETHYLENE GLYCOL 3350 17 G PO PACK
17.0000 g | PACK | Freq: Two times a day (BID) | ORAL | Status: DC
  Administered 2021-02-01 – 2021-02-04 (×7): 17 g via ORAL
  Filled 2021-02-01 (×8): qty 1

## 2021-02-01 MED ORDER — SENNOSIDES-DOCUSATE SODIUM 8.6-50 MG PO TABS
2.0000 | ORAL_TABLET | Freq: Two times a day (BID) | ORAL | Status: DC
  Administered 2021-02-01 – 2021-02-04 (×6): 2 via ORAL
  Filled 2021-02-01 (×7): qty 2

## 2021-02-01 NOTE — Progress Notes (Signed)
NAME:  [name], MRN:  [id number], DOB:  [birth year], LOS: 6 ADMISSION DATE:  01/26/2021  Subjective  No significant overnight events. Continues to express frustration toward his hospitalization here and would rather be at [facility]. We had a lengthy conversation regarding this. He is frustrated with having all of "you doctors" coming into his room and giving him different answers. We discussed that, in regards to COVID, there are certain things that we have more evidence on, to give him more black and white answers, while other aspects of it are still being studied and vary from individual to individual.  He also notes feeling uncomfortable from constipation. He has not had a bowel movement since admission but continues to pass flatus. He denies nausea or abdominal pain.  Objective   Blood pressure 128/66, pulse 61, temperature 98.5 F (36.9 C), temperature source Oral, resp. rate 18, height 6\' 2"  (1.88 m), weight 80.5 kg, SpO2 96 %.     Intake/Output Summary (Last 24 hours) at 02/01/2021 0504 Last data filed at 02/01/2021 0429 Gross per 24 hour  Intake -  Output 500 ml  Net -500 ml   Filed Weights   01/26/21 2107  Weight: 80.5 kg   Physical Exam: General: Chronically ill-appearing. No acute distress Pulm: breathing comfortably on RA with O2 saturations 90-92% while speaking. Fine crackles appreciated throughout with bases>apex. Abd: Soft. Not distended. Not tender to palpation. Bowel sounds present.  Labs    CBC Latest Ref Rng & Units 02/01/2021 01/31/2021 01/30/2021  WBC 4.0 - 10.5 K/uL 1.3(LL) 3.2(L) 2.9(L)  Hemoglobin 13.0 - 17.0 g/dL 8.8(L) 8.7(L) 9.0(L)  Hematocrit 39.0 - 52.0 % 26.1(L) 26.1(L) 26.8(L)  Platelets 150 - 400 K/uL 91(L) 71(L) 65(L)   BMP Latest Ref Rng & Units 02/01/2021 01/31/2021 01/30/2021  Glucose 70 - 99 mg/dL 202(H) 128(H) 108(H)  BUN 8 - 23 mg/dL 18 15 10   Creatinine 0.61 - 1.24 mg/dL 0.89 0.90 0.78  Sodium 135 - 145 mmol/L 137 135 138  Potassium 3.5 - 5.1  mmol/L 4.0 3.7 3.6  Chloride 98 - 111 mmol/L 104 103 106  CO2 22 - 32 mmol/L 25 24 24   Calcium 8.9 - 10.3 mg/dL 8.1(L) 7.8(L) 7.9(L)   Consults:  Infectious disease Hematology  Significant Diagnostic Tests:  CXR 1/31 > Possible LLL opacity  Micro Data:  BCx 1/31 > NGTD COVID-19 1/31 > Positive  Summary  [name] is 75yo person with interstitial lung disease and rheumatoid arthritis on immunosuppressive medications, hx squamous cell carcinoma of tongue s/p XRT 2015, T2DM, PAD, 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  Assessment & Plan:  Principal Problem:   DIC (disseminated intravascular coagulation) ([place]) Active Problems:   Rheumatoid arthritis ([place])   COVID-19   AKI (acute kidney injury) ([place])   Pancytopenia ([place])   Immunosuppression ([place])   ILD (interstitial lung disease) ([place])   Diarrhea  #COVID-19 Infection. Back on room air overnight however suspect he will need supplemental oxygen at discharge for ambulation based on prior eval by PT #Interstitial lung disease. He will need close outpatient f/u with his pulmonologist at [facility] after discharge to discuss further management of ofev side effects - Decadron 6mg  daily (started 2/5) - C/w Robitussin q4h PRN, Tussionex q12h - Albuterol  2 puffs q6h - Incentive spirometry, flutter valve - continue ofev. Will hold methotrexate tomorrow  - home O2 ordered  #Pancytopenia in the setting of COVID 19, on methotrexate -WBC down to 1.3 this morning, primarily lymphopenic, hgb stable, slight improvement in platelets -ANC 975 indicating moderate neutropenia #DIC in setting of COVID-19. - F/u w/ PCP - CBC 1 week s/p discharge  #Constipation.  Sx not consistent with bowel obstruction at this time however will need to continue to monitor -add BID miralax, increase senokot to 2 tablets BID -increase ambulation, ensure adequate hydration  Chronic: #Recent DVTs/PE.  -Continue with Eliquis 5mg  BID #Rheumatoid arthritis. Will continue with prednisone 5mg  daily. - Holding methotrexate, abatacept #Hypertension -Holding home ARB, patient has been normotensive and stable.  Best practice:  DIET: Regular IVF: n/a DVT PPX: home Eliquis BOWEL: Senokot-S CODE: FULL FAM COM: per pt DISPO: suspect he will be stable for discharge in [place], MD Internal Medicine Resident PGY-1 PAGER: [phone] 02/01/2021 5:04 AM  If after hours (below), please contact on-call pager: [phone] 5PM-7AM Monday-Friday 1PM-7AM Saturday-Sunday

## 2021-02-02 LAB — CBC WITH DIFFERENTIAL/PLATELET
Abs Immature Granulocytes: 0.03 10*3/uL (ref 0.00–0.07)
Basophils Absolute: 0 10*3/uL (ref 0.0–0.1)
Basophils Relative: 0 %
Eosinophils Absolute: 0 10*3/uL (ref 0.0–0.5)
Eosinophils Relative: 0 %
HCT: 29 % — ABNORMAL LOW (ref 39.0–52.0)
Hemoglobin: 9.3 g/dL — ABNORMAL LOW (ref 13.0–17.0)
Immature Granulocytes: 1 %
Lymphocytes Relative: 4 %
Lymphs Abs: 0.2 10*3/uL — ABNORMAL LOW (ref 0.7–4.0)
MCH: 28.7 pg (ref 26.0–34.0)
MCHC: 32.1 g/dL (ref 30.0–36.0)
MCV: 89.5 fL (ref 80.0–100.0)
Monocytes Absolute: 0.3 10*3/uL (ref 0.1–1.0)
Monocytes Relative: 6 %
Neutro Abs: 4.8 10*3/uL (ref 1.7–7.7)
Neutrophils Relative %: 89 %
Platelets: 65 10*3/uL — ABNORMAL LOW (ref 150–400)
RBC: 3.24 MIL/uL — ABNORMAL LOW (ref 4.22–5.81)
RDW: 18.7 % — ABNORMAL HIGH (ref 11.5–15.5)
WBC: 5.3 10*3/uL (ref 4.0–10.5)
nRBC: 0 % (ref 0.0–0.2)

## 2021-02-02 LAB — GLUCOSE, CAPILLARY
Glucose-Capillary: 153 mg/dL — ABNORMAL HIGH (ref 70–99)
Glucose-Capillary: 181 mg/dL — ABNORMAL HIGH (ref 70–99)
Glucose-Capillary: 216 mg/dL — ABNORMAL HIGH (ref 70–99)
Glucose-Capillary: 136 mg/dL — ABNORMAL HIGH (ref 70–99)

## 2021-02-02 LAB — COMPREHENSIVE METABOLIC PANEL
ALT: 37 U/L (ref 0–44)
AST: 57 U/L — ABNORMAL HIGH (ref 15–41)
Albumin: 2.2 g/dL — ABNORMAL LOW (ref 3.5–5.0)
Alkaline Phosphatase: 63 U/L (ref 38–126)
Anion gap: 12 (ref 5–15)
BUN: 28 mg/dL — ABNORMAL HIGH (ref 8–23)
CO2: 20 mmol/L — ABNORMAL LOW (ref 22–32)
Calcium: 8.4 mg/dL — ABNORMAL LOW (ref 8.9–10.3)
Chloride: 105 mmol/L (ref 98–111)
Creatinine, Ser: 0.84 mg/dL (ref 0.61–1.24)
GFR, Estimated: >60 mL/min (ref >60)
Glucose, Bld: 204 mg/dL — ABNORMAL HIGH (ref 70–99)
Potassium: 4.1 mmol/L (ref 3.5–5.1)
Sodium: 137 mmol/L (ref 135–145)
Total Bilirubin: 0.7 mg/dL (ref 0.3–1.2)
Total Protein: 4.3 g/dL — ABNORMAL LOW (ref 6.5–8.1)

## 2021-02-02 LAB — PATHOLOGIST SMEAR REVIEW

## 2021-02-02 MED ORDER — INSULIN ASPART 100 UNIT/ML ~~LOC~~ SOLN: 0.0000 [IU] | Freq: Every day | SUBCUTANEOUS | Status: DC

## 2021-02-02 MED ORDER — BISACODYL 10 MG RE SUPP
10.0000 mg | Freq: Once | RECTAL | Status: AC
  Administered 2021-02-02: 10 mg via RECTAL
  Filled 2021-02-02: qty 1

## 2021-02-02 MED ORDER — INSULIN ASPART 100 UNIT/ML ~~LOC~~ SOLN
0.0000 [IU] | Freq: Three times a day (TID) | SUBCUTANEOUS | Status: DC
  Administered 2021-02-02: 3 [IU] via SUBCUTANEOUS
  Administered 2021-02-02: 5 [IU] via SUBCUTANEOUS
  Administered 2021-02-02: 3 [IU] via SUBCUTANEOUS
  Administered 2021-02-03: 8 [IU] via SUBCUTANEOUS
  Administered 2021-02-03: 3 [IU] via SUBCUTANEOUS
  Administered 2021-02-04: 2 [IU] via SUBCUTANEOUS
  Administered 2021-02-04: 5 [IU] via SUBCUTANEOUS
  Administered 2021-02-05: 3 [IU] via SUBCUTANEOUS

## 2021-02-02 NOTE — Discharge Summary (Incomplete)
Name: [name] MRN: [id number] DOB: [birth year] 75 y.o. PCP: Elaina Pattee, MD  Date of Admission: 01/26/2021  9:18 AM Date of Discharge: 02/05/2021 Attending Physician: Velna Ochs, MD  Subjective: Patient evaluated at bedside this AM. He says he feels very weak, but no change in breathing today. States he talked to his son yesterday and they have decided he will be best suited for rehabilitation facility. Also mentions he is thankful that he is fully vaccinated. Discussed plan [facility] and will hold on other immunosuppressive medications for 1 week after discharge per his rheumatologist. Patient verbalized understanding.  Discharge Diagnosis: 1. Adverse effect for medication 2. COVID-19 infection 3. DIC 4. Acute kidney injury 5. Interstitial lung disease  Discharge Medications: Allergies as of 02/05/2021   No Known Allergies     Medication List    STOP taking these medications   oxyCODONE-acetaminophen 5-325 MG tablet Commonly known as: Roxicet   tadalafil 5 MG tablet Commonly known as: CIALIS     TAKE these medications   abatacept 250 MG injection Commonly known as: ORENCIA Inject 250 mg into the vein every 30 (thirty) days. Start taking on: February 12, 2021 What changed: how much to take   atorvastatin 20 MG tablet Commonly known as: LIPITOR Take 20 mg by mouth daily.   clopidogrel 75 MG tablet Commonly known as: PLAVIX Take 1 tablet by mouth daily.   dexamethasone 6 MG tablet Commonly known as: DECADRON Take 1 tablet (6 mg total) by mouth daily for 4 days. Take 1 tablet (6mg ) starting Friday, 02/06/21, for four total days. Last dose 02/09/21. Start taking on: February 06, 2021   Eliquis 5 MG Tabs tablet Generic drug: apixaban Take 5 mg by mouth 2 (two) times daily.   irbesartan 150 MG tablet Commonly known as: AVAPRO Take 150 mg by mouth daily.   metFORMIN 500 MG tablet Commonly known as:  GLUCOPHAGE Take 500 mg by mouth daily with breakfast.   methotrexate 2.5 MG tablet Commonly known as: RHEUMATREX Take 6 tablets (15 mg total) by mouth once a week. Take 6 tablets (15 mg) once a week Start taking on: February 12, 2021   Ofev 100 MG Caps Generic drug: Nintedanib Take 100 mg by mouth in the morning and at bedtime.   omeprazole 20 MG capsule Commonly known as: PRILOSEC Take 20 mg by mouth daily.   potassium chloride 10 MEQ tablet Commonly known as: KLOR-CON Take 10 mEq by mouth daily.   predniSONE 5 MG tablet Commonly known as: DELTASONE Take 1 tablet (5 mg total) by mouth daily with breakfast. Take 1 tablet (5mg ) daily starting on 02/10/21. Start taking on: February 10, 2021 What changed:   additional instructions  These instructions start on February 10, 2021. If you are unsure what to do until then, ask your doctor or other care provider.            Durable Medical Equipment  (From admission, onward)         Start     Ordered   02/01/21 1113  For home use only DME oxygen  Once       Comments: Resting O2 sat: 88% Ambulatory RA sat: 81% Ambulatory on 3L sat: 88%  Question Answer Comment  Length of Need Lifetime   Frequency Continuous (stationary and portable oxygen unit needed)   Oxygen delivery system Gas      02/01/21 1120  Disposition and follow-up:   [name] was discharged from  [facility] in Stable condition.  At the hospital follow up visit please address:  1. Adverse effect for medication: Patient with diarrhea after re-starting Ofev medication. He had recent admission at [facility], found to be due to [facility]. At discharge patient re-started his previous dose of 150mg , but was prescribed to reduce to 100mg . Please ensure patient is on the correct dose of Ofev at 100mg .  2. COVID-19 infection: Patient also found found to be COVID+ on admission, fully vaccinated. Minimal oxygen requirement. Will need to  finish 10 days of Decadron (needs 1 tablet per day starting Friday 02/06/21 and last dose Monday 02/09/21). He should also remain in isolation for a total of 21 days, through 02/16/21.   3. DIC: Found to be pancytopenic during stay, labs consistent with DIC. Immunosuppressive medications held and WBC, PLT normalized. Discussed with his rheumatologist, Dr. Deeann Dowse, who says he can resume methotrexate and Orencia injection one week after discharge (02/13/20). Will need to re-check CBC in one week to ensure cell lines have normalized.  4. Acute kidney injury: Patient arrived with AKI most likely pre-renal due to GI loss. Resolved while inpatient, will need to re-check BMP in one week.  5. Interstitial lung disease: Patient will need to continue with Ofev 100mg . Hold prednisone while on Decadron. Can resume regular prednisone 5mg  on 02/10/21.  6. Labs / imaging needed at time of follow-up: CBC, CMP  7. Pending labs/ test needing follow-up: none  Follow-up Appointments:    Follow-up Information    Elaina Pattee, MD. Schedule an appointment as soon as possible for a visit in 1 week(s).   Specialty: Family Medicine Contact information: [address] [place] [address] [phone]        Dr. Deeann Dowse. Schedule an appointment as soon as possible for a visit in 2 week(s).   Why: Please make an appointment with your rheumatologist, Dr. Deeann Dowse, at [facility]. You should make an appointment within 2-3 weeks of discharge.       Dr. Corrin Parker. Schedule an appointment as soon as possible for a visit in 2 week(s).   Why: Please make sure to make an appointment with Dr. Corrin Parker, your pulmonologist at [facility], within 2-3 weeks of discharge.             Hospital Course: 1. Adverse effect for medication: Patient arrived to [facility] via EMS with 2 weeks of diarrhea and malaise. Presentation similar to previous admission at [facility] from 1/14-1/17. During that hospitalization, diarrhea  resolved with holding Ofev. At discharge, Ofev was reduced to 100mg . However, patient had continued taking Ofev 150mg . On admission, Ofev held and diarrhea subsequently stopped. Patient had unfortunately not received the new prescription for Ofev 100mg  prior to admission. [facility] Family to pick up medication and take to patient's SNF. He will need to continue taking the medication and follow-up with his pulmonologist in 2-3 weeks.    2. COVID-19 infection: On arrival to ED, patient found to be COVID positive. He reports he is fully vaccinated for COVID. During his hospital course, patient had new oxygen requirement of 2L [place], therefore Decadron started. Patient intermittently needed oxgyen at 2L, but did not have any respiratory distress. When ambulating, patient desaturated and required more oxygen. Will have oxygen sent to rehabilitation center with him while he re-gains strength. He will need to  follow-up with PCP within one week.  3. DIC: During hospitalization, found to have pancytopenia. Labs most consistent with DIC. As patient continued to recover from COVID-19 infection, his leukopenia and thrombocytopenia resolved. Given patient is usually on immunosuppressive medications that could cause pancytopenia, discussed with his rheumatologist from [place], Dr. Deeann Dowse. Dr. Deeann Dowse recommends holding methotrexate and Orencia injection until 1 week after discharge (02/12/21). Patient will need to follow-up with rheumatologist within 2-3 weeks.   4. Acute kidney injury: Patient arrived to [facility] and found to have elevated creatinine from baseline. Given he had two weeks of diarrhea prior to arrival, AKI believed to be pre-renal 2/2 GI losses. Renal function improved with fluids and remained stable throughout rest of hospitalization.  Discharge Exam:   BP 138/70 (BP Location: Left Arm)   Pulse 64   Temp 97.9 F (36.6 C) (Oral)    Resp 18   Ht 6\' 2"  (1.88 m)   Wt 80.5 kg   SpO2 92%   BMI 22.79 kg/m   General: Resting comfortably in bed, no acute distress, appears tired. Pulm: Normal work of breathing, no use of accessory muscles Neuro: Awake, alert. Moving extremities appropriately Psych: Normal speech, mood, affect  Pertinent Labs, Studies, and Procedures:  CBC Latest Ref Rng & Units 02/05/2021 02/04/2021 02/03/2021  WBC 4.0 - 10.5 K/uL 6.2 5.9 7.0  Hemoglobin 13.0 - 17.0 g/dL 9.3(L) 9.2(L) 9.3(L)  Hematocrit 39.0 - 52.0 % 28.1(L) 28.5(L) 29.1(L)  Platelets 150 - 400 K/uL 186 146(L) 157   BMP Latest Ref Rng & Units 02/05/2021 02/04/2021 02/03/2021  Glucose 70 - 99 mg/dL 137(H) 127(H) 163(H)  BUN 8 - 23 mg/dL 24(H) 32(H) 30(H)  Creatinine 0.61 - 1.24 mg/dL 0.84 0.83 0.85  Sodium 135 - 145 mmol/L 138 138 139  Potassium 3.5 - 5.1 mmol/L 3.6 3.8 3.6  Chloride 98 - 111 mmol/L 102 104 103  CO2 22 - 32 mmol/L 24 23 22   Calcium 8.9 - 10.3 mg/dL 8.6(L) 8.5(L) 8.6(L)   CXR 1/31: Hazy left lower lobe opacity, suspicious for pneumonia.  Discharge Instructions:  [name], I am so glad you are feeling better. You were admitted because of diarrhea caused by your medication. You were also found to have COVID-19 infection. Thankfully, you required minimal oxygen support and are stable for discharge. Please see the following notes:  -You will continue to take Decadron (steroid) for four more days, starting 02/06/21, with the last dose on 02/09/21. You can then resume taking your regular prednisone 5mg  on 02/10/21.  -Your Ofev medication should be 100mg . Your son is coordinating with family and [facility] and [facility] facility.  -In addition, I discussed methotrexate and Orencia medication with your rheumatologist, Dr. Deeann Dowse. You should resume these medications one week after discharge (02/12/21). Please make sure to make a follow-up appointment with him within the next 2-3  weeks.  -Please make sure to schedule an appointment with your primary care doctor within the next week and another appointment with your pulmonologist at [facility] within the next 2-3 weeks.  It was a pleasure meeting you, [name]. I hope you stay happy and healthy!  Thank you, Sanjuan Dame, MD  Signed:  Sanjuan Dame, MD Internal Medicine Resident PGY-1 [facility] Pager: [phone] 02/05/2021 10:49 AM

## 2021-02-02 NOTE — Discharge Instructions (Signed)

## 2021-02-02 NOTE — Progress Notes (Signed)
NAME:  [name], MRN:  [id number], DOB:  [birth year], LOS: 7 ADMISSION DATE:  01/26/2021  Subjective  Patient evaluated at bedside this AM. Notes he is feeling okay, no further dyspnea. Patient endorses frustration over hospitalization and the calls from [facility]. States that he feels frustrated overall with the care here and says he would feel more comfortable at [facility]. We discussed patient's improvement thus far, including his laboratory values and oxygenation at rest. Discussed options between [place] [facility] and SNF. Patient requested we discuss with his son prior to making a decision.   Objective   Blood pressure 138/70, pulse 64, temperature 97.9 F (36.6 C), temperature source Oral, resp. rate 18, height 6\' 2"  (1.88 m), weight 80.5 kg, SpO2 92 %.     Intake/Output Summary (Last 24 hours) at 02/02/2021 0525 Last data filed at 02/01/2021 1657 Gross per 24 hour  Intake --  Output 625 ml  Net -625 ml   Filed Weights   01/26/21 2107  Weight: 80.5 kg   Physical Exam: General: Laying comfortably in bed, no acute distress Pulm: Normal work of breathing. No use of accessory muscles. Talking in complete sentences. Neuro: Awake, alert. Moving extremities appropriately. Psych: Circumferential thinking. Normal speech.  Labs    CBC Latest Ref Rng & Units 02/02/2021 02/01/2021 01/31/2021  WBC 4.0 - 10.5 K/uL 5.3 1.3(LL) 3.2(L)  Hemoglobin 13.0 - 17.0 g/dL 9.3(L) 8.8(L) 8.7(L)  Hematocrit 39.0 - 52.0 % 29.0(L) 26.1(L) 26.1(L)  Platelets 150 - 400 K/uL 65(L) 91(L) 71(L)   BMP Latest Ref Rng & Units 02/02/2021 02/01/2021 01/31/2021  Glucose 70 - 99 mg/dL 204(H) 202(H) 128(H)  BUN 8 - 23 mg/dL 28(H) 18 15  Creatinine 0.61 - 1.24 mg/dL 0.84 0.89 0.90  Sodium 135 - 145 mmol/L 137 137 135  Potassium 3.5 - 5.1 mmol/L 4.1 4.0 3.7  Chloride 98 - 111 mmol/L 105 104 103  CO2 22 - 32 mmol/L 20(L) 25 24  Calcium 8.9 - 10.3 mg/dL 8.4(L) 8.1(L) 7.8(L)    Consults:  Infectious  disease Hematology  Significant Diagnostic Tests:  CXR 1/31 > Possible LLL opacity  Micro Data:  BCx 1/31 > NGTD COVID-19 1/31 > positive  Summary  [name] is 75yo person with interstitial lung disease and rheumatoid arthritis on immunsuppressive medications, 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  Assessment & Plan:  Principal Problem:   DIC (disseminated intravascular coagulation) ([place]) Active Problems:   Rheumatoid arthritis ([place])   COVID-19   AKI (acute kidney injury) ([place])   Pancytopenia ([place])   Immunosuppression ([place])   ILD (interstitial lung disease) ([place])   Diarrhea  #COVID-19 infection Continues to desaturate with ambulation, but sating well on RA at rest. He has had five days of antibiotics and three days of Decadron. PT recommending SNF v Home Health. Patient lives at home alone at this time. Discussed with son, [name]. Both he and I agree patient will need close assistance once he is discharged. He believes the patient will do the best when he is at home in a familiar place. Will discuss with social work if there are options for a home aide, but if not, will defer to SNF. - C/w Decadron 6mg  (day 3) - C/w Robitussin q4h PRN, Tussionex q12h - Albuterol 2 puffs q6h -  Incentive spirometry, flutter valve - Trend CMP - Daily CBC, Mg, Phos  #Pancytopenia #DIC in setting of COVID-19 WBC increased to 5.3, although other cell lines continue to be low. Per hematology's recommendations, patient stable for discharge but should be closely followed by PCP. - F/u with PCP for CBC 1 week s/p discharge  #Interstitial lung disease #Rheumatoid arthritis Patient on multiple immunosuppressive medications. [facility] patient's Ofev. In  addition, will continue to hold MTX, abatacept. Plan to set up appointment with patient's rheumatologist at [facility] soon after discharge. - [facility] O[facility]v - Hold MTX, abatacept - Rheumatology f/u at [facility] s/p discharge  Best practice:  DIET: Regular IVF: n/a DVT PPX: home Eliquis BOWEL: Senokot-S CODE: FULL FAM COM: Discussed with son, [name].  Sanjuan Dame, MD Internal Medicine Resident PGY-1 PAGER: [phone] 02/02/2021 5:25 AM  If after hours (below), please contact on-call pager: [phone] 5PM-7AM Monday-Friday 1PM-7AM Saturday-Sunday

## 2021-02-02 NOTE — Progress Notes (Signed)
Physical Therapy Treatment Patient Details Name: [name] MRN: [id number] DOB: Apr 28, 1946 Today's Date: 02/02/2021    History of Present Illness 75 year old male with past medical history significant for RA, interstitial lung disease, SCC of base of tongue, melanoma (s/p excision 69/6295), M8UX  complicated by peripheral neuropathy, PAD, AAA left iliac artery aneurysm s/p EVAR, right ICA stenosis, history of diverticulitis s/p partial colectomy, recent left common femoral, tibial and peroneal vein DVTs and PE (diagnosed 12/30/20 on apixaban) who presented for evaluation of ongoing generalized weakness. +COVID with pna, AKI, DIC (disseminated intravascular congestion)    PT Comments    Pt voicing frustration with entire hospital stay and lack of BM on entry. Pt educated for mobility, progression, lung function, need for assist and P.T. plan throughout session. Pt able to recognize with limited mobility need for RW and desaturation limiting his ability to care for himself. Pt lives alone and does not have assist at home. For pt to return home he will need to maintain oxygen saturations and stability without assist. Until pt able to achieve this level of mobility ST-SNF may be needed. Pt educated for and performed scapular retraction and shoulder extension to expand the chest wall.   95% on RA, 85% drop with transfer to chair on RA 6L with gait drop to 82%, HR 124 after 3 min recovery to 95% HR 112  90% on 3L end of session at rest Performed IS to 1500cc x 5   Follow Up Recommendations  SNF;Home health PT;Supervision for mobility/OOB (pending progression)     Equipment Recommendations  Rolling walker with 5" wheels;3in1 (PT)    Recommendations for Other Services       Precautions / Restrictions Precautions Precautions: Fall Precaution Comments: watch sats    Mobility  Bed Mobility Overal bed mobility: Needs Assistance Bed Mobility: Supine to Sit     Supine to sit: Min  guard;HOB elevated     General bed mobility comments: HOB 20 degrees, guarding for safety and monitoring SPO2 as drop to 85% with transition from bed to chair on RA  Transfers Overall transfer level: Needs assistance   Transfers: Sit to/from Stand Sit to Stand: Min guard         General transfer comment: cues for hand safety and hand placement, pt reaching out for furniture assist to stand  Ambulation/Gait Ambulation/Gait assistance: Min guard Gait Distance (Feet): 90 Feet Assistive device: Rolling walker (2 wheeled) Gait Pattern/deviations: Step-through pattern;Trunk flexed   Gait velocity interpretation: 1.31 - 2.62 ft/sec, indicative of limited community ambulator General Gait Details: pt initially stating he didn't want RW but reliant on it within a few steps with cues for posture, position in RW and activity modification with fatigue as pt with desaturation to 82% on 6L with gait. Pt maintaining rW too anterior despite cues   Stairs             Wheelchair Mobility    Modified Rankin (Stroke Patients Only)       Balance Overall balance assessment: Mild deficits observed, not formally tested                                          Cognition Arousal/Alertness: Awake/alert Behavior During Therapy: Flat affect Overall Cognitive Status: Impaired/Different from baseline Area of Impairment: Safety/judgement;Problem solving  Following Commands: Follows one step commands inconsistently Safety/Judgement: Decreased awareness of deficits;Decreased awareness of safety     General Comments: Pt reporting frustration with not having BM, [facility] calling about medications, not moving around yet not receptive to therapy presence to meet needs for mobility and assist with GI motility through mobility until after he voiced all frustrations      Exercises General Exercises - Lower Extremity Long Arc Quad: AROM;Both;Seated;15  reps Hip Flexion/Marching: AROM;Both;Seated;10 reps    General Comments        Pertinent Vitals/Pain Pain Assessment: No/denies pain    Home Living                      Prior Function            PT Goals (current goals can now be found in the care plan section) Progress towards PT goals: Progressing toward goals    Frequency    Min 3X/week      PT Plan Current plan remains appropriate    Co-evaluation              AM-PAC PT "6 Clicks" Mobility   Outcome Measure  Help needed turning from your back to your side while in a flat bed without using bedrails?: None Help needed moving from lying on your back to sitting on the side of a flat bed without using bedrails?: None Help needed moving to and from a bed to a chair (including a wheelchair)?: A Little Help needed standing up from a chair using your arms (e.g., wheelchair or bedside chair)?: A Little Help needed to walk in hospital room?: A Little Help needed climbing 3-5 steps with a railing? : A Lot 6 Click Score: 19    End of Session Equipment Utilized During Treatment: Oxygen Activity Tolerance: Patient limited by fatigue Patient left: in chair;with call bell/phone within reach Nurse Communication: Mobility status;Other (comment) PT Visit Diagnosis: Unsteadiness on feet (R26.81);Difficulty in walking, not elsewhere classified (R26.2)     Time: 1610-9604 PT Time Calculation (min) (ACUTE ONLY): 32 min  Charges:  $Gait Training: 8-22 mins $Therapeutic Exercise: 8-22 mins                     [place], PT Acute Rehabilitation Services Pager: [phone] Office: [place] 02/02/2021, 1:46 PM

## 2021-02-03 LAB — COMPREHENSIVE METABOLIC PANEL
ALT: 38 U/L (ref 0–44)
AST: 48 U/L — ABNORMAL HIGH (ref 15–41)
Albumin: 2.4 g/dL — ABNORMAL LOW (ref 3.5–5.0)
Alkaline Phosphatase: 59 U/L (ref 38–126)
Anion gap: 14 (ref 5–15)
BUN: 30 mg/dL — ABNORMAL HIGH (ref 8–23)
CO2: 22 mmol/L (ref 22–32)
Calcium: 8.6 mg/dL — ABNORMAL LOW (ref 8.9–10.3)
Chloride: 103 mmol/L (ref 98–111)
Creatinine, Ser: 0.85 mg/dL (ref 0.61–1.24)
GFR, Estimated: >60 mL/min (ref >60)
Glucose, Bld: 163 mg/dL — ABNORMAL HIGH (ref 70–99)
Potassium: 3.6 mmol/L (ref 3.5–5.1)
Sodium: 139 mmol/L (ref 135–145)
Total Bilirubin: 0.9 mg/dL (ref 0.3–1.2)
Total Protein: 5.2 g/dL — ABNORMAL LOW (ref 6.5–8.1)

## 2021-02-03 LAB — GLUCOSE, CAPILLARY
Glucose-Capillary: 111 mg/dL — ABNORMAL HIGH (ref 70–99)
Glucose-Capillary: 176 mg/dL — ABNORMAL HIGH (ref 70–99)
Glucose-Capillary: 260 mg/dL — ABNORMAL HIGH (ref 70–99)
Glucose-Capillary: 155 mg/dL — ABNORMAL HIGH (ref 70–99)

## 2021-02-03 LAB — CBC WITH DIFFERENTIAL/PLATELET
Abs Immature Granulocytes: 0.05 10*3/uL (ref 0.00–0.07)
Basophils Absolute: 0 10*3/uL (ref 0.0–0.1)
Basophils Relative: 0 %
Eosinophils Absolute: 0 10*3/uL (ref 0.0–0.5)
Eosinophils Relative: 0 %
HCT: 29.1 % — ABNORMAL LOW (ref 39.0–52.0)
Hemoglobin: 9.3 g/dL — ABNORMAL LOW (ref 13.0–17.0)
Immature Granulocytes: 1 %
Lymphocytes Relative: 5 %
Lymphs Abs: 0.4 10*3/uL — ABNORMAL LOW (ref 0.7–4.0)
MCH: 28.6 pg (ref 26.0–34.0)
MCHC: 32 g/dL (ref 30.0–36.0)
MCV: 89.5 fL (ref 80.0–100.0)
Monocytes Absolute: 0.5 10*3/uL (ref 0.1–1.0)
Monocytes Relative: 7 %
Neutro Abs: 6.1 10*3/uL (ref 1.7–7.7)
Neutrophils Relative %: 87 %
Platelets: 157 10*3/uL (ref 150–400)
RBC: 3.25 MIL/uL — ABNORMAL LOW (ref 4.22–5.81)
RDW: 18.6 % — ABNORMAL HIGH (ref 11.5–15.5)
WBC: 7 10*3/uL (ref 4.0–10.5)
nRBC: 0 % (ref 0.0–0.2)

## 2021-02-03 NOTE — Progress Notes (Signed)
NAME:  LANARD ARGUIJO, MRN:  [id number], DOB:  Dec 31, 1945, LOS: 8 ADMISSION DATE:  01/26/2021  Subjective  Patient evaluated at bedside this AM. Patient mentions overall he feels tired, but breathing okay. Continues to endorse frustration over his ILD medication. Again explained to patient we are trying to get him the medication that his pulmonologist at [facility] prescribed. Otherwise, discussed with patient disposition plans. Explained that his son, Tavone Caesar, and I are both in agreement that SNF is the best option for him. Patient mentions he would like to talk to his son again this afternoon and discuss tomorrow.  Objective   Blood pressure 136/72, pulse 60, temperature (!) 97.4 F (36.3 C), temperature source Axillary, resp. rate 20, height 6\' 2"  (1.88 m), weight 80.5 kg, SpO2 99 %.     Intake/Output Summary (Last 24 hours) at 02/03/2021 0631 Last data filed at 02/03/2021 0615 Gross per 24 hour  Intake --  Output 600 ml  Net -600 ml   Filed Weights   01/26/21 2107  Weight: 80.5 kg   Physical Exam: General: Laying in bed, no acute distress Pulm: No increased work of breathing. No use of accessory muscles. Psych: Normal mood, speech.  Labs    CBC Latest Ref Rng & Units 02/03/2021 02/02/2021 02/01/2021  WBC 4.0 - 10.5 K/uL 7.0 5.3 1.3(LL)  Hemoglobin 13.0 - 17.0 g/dL 9.3(L) 9.3(L) 8.8(L)  Hematocrit 39.0 - 52.0 % 29.1(L) 29.0(L) 26.1(L)  Platelets 150 - 400 K/uL 157 65(L) 91(L)   BMP Latest Ref Rng & Units 02/03/2021 02/02/2021 02/01/2021  Glucose 70 - 99 mg/dL 163(H) 204(H) 202(H)  BUN 8 - 23 mg/dL 30(H) 28(H) 18  Creatinine 0.61 - 1.24 mg/dL 0.85 0.84 0.89  Sodium 135 - 145 mmol/L 139 137 137  Potassium 3.5 - 5.1 mmol/L 3.6 4.1 4.0  Chloride 98 - 111 mmol/L 103 105 104  CO2 22 - 32 mmol/L 22 20(L) 25  Calcium 8.9 - 10.3 mg/dL 8.6(L) 8.4(L) 8.1(L)   Consults:  Infectious disease Hematology  Significant Diagnostic Tests:  CXR 1/31 > possible LLL opacity  Micro Data:  BCx  1/31 > NGTD COVID-19 1/31 > positive  Summary  Mr. 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   Assessment & Plan:  Principal Problem:   DIC (disseminated intravascular coagulation) ([place]) Active Problems:   Rheumatoid arthritis ([place])   COVID-19   AKI (acute kidney injury) ([place])   Pancytopenia ([place])   Immunosuppression ([place])   ILD (interstitial lung disease) ([place])   Diarrhea  #COVID-19 infection Patient doing well overall, but desating with ambulation. Completed five days of antibiotics, started steroids over the weekend. Currently patient is too weak to go home alone in my medical opinion. Discussed this with his son, Bradey Luzier, this morning. He also agrees that SNF would be best placement for the patient. Patient would like to discuss with his son again this evening, but we will go ahead and start the process of looking for SNF. - C/w Decadron 6mg  (day 4) - C/w Robitussin q4h PRN, Tussionex q12h - Albuterol 2 puffs q6h - Incentive spirometry, flutter valve - Trend CMP - Daily CBC, Mg, Phos  #Normocytic anemia #DIC in setting of COVID-19 WBC, platelets back to normal today, continues to be anemic. Possibly  recovering from DIC v holding his methotrexate. Will continue to monitor while inpatient and he will need follow-up with his PCP within a week from discharge. - CBC 1 week s/p discharge  #Interstitial lung disease #Rheumatoid arthritis Patient on multiple immunosuppressive medications, [facility]. Will continue to hold his immunosuppressives for now while inpatient. - Holding MTX, abatacept - Rheum f/u at [facility] after discharge  Best practice:  DIET: Regular IVF: n/a DVT PPX: home Eliquis BOWEL:  Senokot-S CODE: FULL FAM COM: Discussed disposition plans with son, Daishaun Ayre, this morning.  Sanjuan Dame, MD Internal Medicine Resident PGY-1 PAGER: [phone] 02/03/2021 6:31 AM  If after hours (below), please contact on-call pager: [phone] 5PM-7AM Monday-Friday 1PM-7AM Saturday-Sunday

## 2021-02-03 NOTE — NC FL2 (Signed)
[place] LEVEL OF CARE SCREENING TOOL     IDENTIFICATION  Patient Name: [name] Birthdate: 16-Jun-1946 Sex: male Admission Date (Current Location): 01/26/2021  [facility] and [place] Number:  Herbalist and Address:  [facility] [place]. [facility], [place] [address] [place], [place] [address]      Provider Number: [id number]  Attending Physician Name and Address:  Velna Ochs, MD  Relative Name and Phone Number:  [place], [place]: [phone]    Current Level of Care: Hospital Recommended Level of Care: [place] Prior Approval Number:    Date Approved/Denied:   PASRR Number: [id number]  Discharge Plan: SNF    Current Diagnoses: Patient Active Problem List   Diagnosis Date Noted  . Pancytopenia ([place])   . Immunosuppression ([place])   . ILD (interstitial lung disease) ([place])   . Diarrhea   . DIC (disseminated intravascular coagulation) ([place])   . AKI (acute kidney injury) ([place])   . COVID-19 01/26/2021  . Rheumatoid arthritis ([place]) 07/22/2015  . Mucositis due to chemotherapy 06/25/2015  . Mucositis due to radiation therapy 06/25/2015  . SBO (small bowel obstruction) ([place]) 05/29/2015  . 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 H/O drug therapy 05/12/2015  . Abnormal mental state 04/22/2015  . Fast heart beat 04/22/2015  . Febrile 04/18/2015  . Candida esophagitis ([place]) 04/16/2015  . Can't get food down 04/16/2015  . Cancer of base of tongue ([place]) 04/02/2015  . 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 Fall 05/24/2014  . Traumatic pneumothorax 05/23/2014  . Diabetes mellitus, type 2 ([place]) 05/01/2014  . Perirectal abscess 07/31/2013  . Hemorrhoids 07/31/2013  . Lentigines 11/13/2012  . 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 Atypical nevus 08/29/2012  . Malignant melanoma in situ ([place]) 08/29/2012  . Polypharmacy 08/23/2012  . Dyspnea 08/14/2012  . Rheumatoid arthritis(714.0) 08/14/2012  . HTN  (hypertension) 08/14/2012  . GERD (gastroesophageal reflux disease) 08/14/2012  . Emphysema 08/14/2012  . 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 DU (duodenal ulcer) 10/15/2011  . 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 Esophageal stenosis 10/15/2011    Orientation RESPIRATION BLADDER Height & Weight     Self,Time,Place  Normal Continent Weight: 177 lb 7.5 oz (80.5 kg) Height:  6\' 2"  (188 cm)  BEHAVIORAL SYMPTOMS/MOOD NEUROLOGICAL BOWEL NUTRITION STATUS      Continent Diet  AMBULATORY STATUS COMMUNICATION OF NEEDS Skin   Extensive Assist Verbally Normal                       Personal Care Assistance Level of Assistance  Bathing,Feeding,Dressing Bathing Assistance: Maximum assistance Feeding assistance: Independent Dressing Assistance: Maximum assistance     Functional Limitations Info  Sight,Hearing,Speech Sight Info: Adequate Hearing Info: Adequate Speech Info: Adequate    SPECIAL CARE FACTORS FREQUENCY  PT (By licensed PT),OT (By licensed OT)     PT Frequency: 5X per week OT Frequency: 5X per week            Contractures Contractures Info: Not present    Additional Factors Info  Code Status,Isolation Precautions,Allergies,Insulin Sliding Scale Code Status Info: Full Allergies Info: NKA   Insulin Sliding Scale Info: insulin injection 0-15 units Isolation Precautions Info: 01/26/21 COVID Positive     Current Medications (02/03/2021):  This is the current hospital active medication list Current Facility-Administered Medications  Medication Dose Route Frequency Provider Last Rate Last Admin  . (feeding supplement) PROSource Plus liquid 30 mL  30 mL Oral BID BM Lalla Brothers  Marcello Moores, MD   30 mL at 02/03/21 [phone]  . acetaminophen (TYLENOL) tablet 650 mg  650 mg Oral Q6H PRN Mitzi Hansen, MD   650 mg at 02/01/21 1655  . albuterol (VENTOLIN HFA) 108 (90 Base) MCG/ACT inhaler 2 puff  2 puff Inhalation Q6H PRN Thurnell Lose, MD      . apixaban (ELIQUIS)  tablet 5 mg  5 mg Oral BID Darrick Meigs, Rylee, MD   5 mg at 02/03/21 0843  . atorvastatin (LIPITOR) tablet 20 mg  20 mg Oral Daily Darrick Meigs, Rylee, MD   20 mg at 02/03/21 0843  . chlorpheniramine-HYDROcodone (TUSSIONEX) 10-8 MG/5ML suspension 5 mL  5 mL Oral Q12H PRN Mitzi Hansen, MD   5 mL at 01/31/21 2202  . clopidogrel (PLAVIX) tablet 75 mg  75 mg Oral Daily Darrick Meigs, Rylee, MD   75 mg at 02/03/21 0842  . dexamethasone (DECADRON) tablet 6 mg  6 mg Oral Daily Sanjuan Dame, MD   6 mg at 02/03/21 0843  . feeding supplement (ENSURE ENLIVE / ENSURE PLUS) liquid 237 mL  237 mL Oral TID WC Axel Filler, MD   237 mL at 02/03/21 0843  . guaiFENesin-dextromethorphan (ROBITUSSIN DM) 100-10 MG/5ML syrup 10 mL  10 mL Oral Q4H PRN Christian, Rylee, MD      . insulin aspart (novoLOG) injection 0-15 Units  0-15 Units Subcutaneous TID WC Sanjuan Dame, MD   5 Units at 02/02/21 1732  . insulin aspart (novoLOG) injection 0-5 Units  0-5 Units Subcutaneous QHS Sanjuan Dame, MD      . multivitamin with minerals tablet 1 tablet  1 tablet Oral Daily Axel Filler, MD   1 tablet at 02/03/21 [phone]  . Nintedanib (Ofev) CAPS 100 mg  100 mg Oral Daily Agyei, Obed K, MD      . pantoprazole (PROTONIX) EC tablet 40 mg  40 mg Oral Daily Darrick Meigs, Rylee, MD   40 mg at 02/03/21 0842  . polyethylene glycol (MIRALAX / GLYCOLAX) packet 17 g  17 g Oral BID Darrick Meigs, Rylee, MD   17 g at 02/03/21 0842  . senna-docusate (Senokot-S) tablet 2 tablet  2 tablet Oral BID Mitzi Hansen, MD   2 tablet at 02/03/21 9629   Facility-Administered Medications Ordered in Other Encounters  Medication Dose Route Frequency Provider Last Rate Last Admin  . 0.9 %  sodium chloride infusion   Intravenous Once Lloyd Huger, MD      . 0.9 %  sodium chloride infusion   Intravenous Once Lloyd Huger, MD      . abatacept [facility]) 1,000 mg in sodium chloride 0.9 % 100 mL IVPB  1,000 mg Intravenous Q28 days  Lloyd Huger, MD      . abatacept [facility]) 1,000 mg in sodium chloride 0.9 % 100 mL IVPB  1,000 mg Intravenous Q28 days Lloyd Huger, MD      . abatacept [facility]) 1,000 mg in sodium chloride 0.9 % 100 mL IVPB  1,000 mg Intravenous Q28 days Lloyd Huger, MD   Stopped at 02/23/19 1340     Discharge Medications: Please see discharge summary for a list of discharge medications.  Relevant Imaging Results:  Relevant Lab Results:   Additional Information ss#:[id number]  Kailoni Vahle, LCSWA

## 2021-02-03 NOTE — TOC Progression Note (Signed)
Transition of Care [facility]) - Progression Note    Patient Details  Name: [name] MRN: [id number] Date of Birth: 1946/01/24  Transition of Care [facility]) CM/SW [place], LCSW Phone Number: 02/03/2021, 5:20 PM  Clinical Narrative:    CSW received son's request for SNF placement. [facility] may be able to accept patient tomorrow depending on if patient's family can bring in [place] and [place] since they are expensive medications. MD will let CSW know status tomorrow.    Expected Discharge Plan: Skilled Nursing Facility Barriers to Discharge: SNF Pending bed offer  Expected Discharge Plan and Services Expected Discharge Plan: [place] In-house Referral: Clinical Social Work Discharge Planning Services: CM Consult Post Acute Care Choice: [place] arrangements for the past 2 months: Single Family Home                                       Social Determinants of Health (SDOH) Interventions    Readmission Risk Interventions No flowsheet data found.

## 2021-02-03 NOTE — Progress Notes (Signed)
Occupational Therapy Evaluation Patient Details Name: [name] MRN: [id number] DOB: 02/05/1946 Today's Date: 02/03/2021    History of Present Illness 75 year old male with past medical history significant for RA, interstitial lung disease, SCC of base of tongue, melanoma (s/p excision 63/8937), D4KA  complicated by peripheral neuropathy, PAD, AAA left iliac artery aneurysm s/p EVAR, right ICA stenosis, history of diverticulitis s/p partial colectomy, recent left common femoral, tibial and peroneal vein DVTs and PE (diagnosed 12/30/20 on apixaban) who presented for evaluation of ongoing generalized weakness. 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 Pt was limited today by orthostatic hypotension - noted below. Began counterpressure exercises to help with BP.  Ambulated @ 50 ft with 2 rest breaks with SpO2 85 on RA with increased WOB noted. Rebounds quickly to 90s with pursed lip breathing. Overall min A with ADL tasks. Pt frustrated with not feeling well and his current medical situation. Increased time spent educating pt on his role in recovery and what he could do to help with his rehab. Pt very appreciative. At this time recommend rehab at [facility]. Will follow acutely.  Orthostatic BPs  Supine   Sitting 136/79     Standing 100/72  Return to sitting after 2 min 127/65   Pt has orders for incentive spirometer and flutter valve - not in room. Pt issued incentive spirometer and educated on use - able to pull 1217ml; unable to locate flutter valve - notified nsg who stated she would find flutter valve for pt.     Follow Up Recommendations  Supervision/Assistance - 24 hour;SNF (pending progress)    Equipment Recommendations  3 in 1 bedside commode    Recommendations for Other Services       Precautions /  Restrictions Precautions Precautions: Fall Precaution Comments: orthostatic; desats wtih activity      Mobility Bed Mobility Overal bed mobility: Needs Assistance       Supine to sit: Supervision          Transfers Overall transfer level: Needs assistance   Transfers: Sit to/from Stand;Stand Pivot Transfers Sit to Stand: Min guard Stand pivot transfers: Min guard            Balance Overall balance assessment: Needs assistance   Sitting balance-Leahy Scale: Good       Standing balance-Leahy Scale: Poor Standing balance comment: reliant on external support                           ADL either performed or assessed with clinical judgement   ADL Overall ADL's : Needs assistance/impaired     Grooming: Set up;Sitting   Upper Body Bathing: Set up;Sitting   Lower Body Bathing: Minimal assistance;Sit to/from stand   Upper Body Dressing : Set up;Sitting   Lower Body Dressing: Minimal assistance;Sit to/from stand   Toilet Transfer: Minimal assistance;Ambulation;Regular Toilet;Comfort height toilet;Grab bars;RW   Toileting- Clothing Manipulation and Hygiene: Minimal assistance       Functional mobility during ADLs: Minimal assistance;Rolling walker;Cueing for safety;Cueing for sequencing General ADL Comments: fatigues easily with ADL tasks; orthostatic with standing     Vision Baseline Vision/History: Wears glasses       Perception     Praxis      Pertinent Vitals/Pain Pain Assessment: No/denies pain     Hand Dominance Right  Extremity/Trunk Assessment Upper Extremity Assessment Upper Extremity Assessment: Generalized weakness (hx of RA; loss of ROM but functional)   Lower Extremity Assessment Lower Extremity Assessment: Defer to PT evaluation   Cervical / Trunk Assessment Cervical / Trunk Assessment: Kyphotic   Communication Communication Communication: No difficulties   Cognition Arousal/Alertness: Awake/alert Behavior  During Therapy: WFL for tasks assessed/performed Overall Cognitive Status: No family/caregiver present to determine baseline cognitive functioning                             Awareness: Emergent Problem Solving: Slow processing General Comments: will further assess; upset about care in hospital   General Comments  Pt apparently frustrated with his loss of independence and not understanding why he feels so poorly. After discussion regarding the effects of Covid and the resulting weakness and education regarding what he can do to improve (OOB, incentive spirometer/flutter valve; HEP)pt appreciative adn completing all tasks.    Exercises General Exercises - Lower Extremity Long Arc Quad: Both;20 reps;Seated Hip Flexion/Marching: Both;20 reps;Seated Other Exercises Other Exercises: initiated level 1 theraband - written handout provided Other Exercises: incentive spriometer x 10 - able to pull 1250 ml Other Exercises: ambulated 50 ft in room with 2 rest breaks Other Exercises: counterpressure exercises initiated   Shoulder Instructions      Home Living Family/patient expects to be discharged to:: Private residence Living Arrangements: Alone Available Help at Discharge: Friend(s);Available PRN/intermittently Type of Home: House Home Access: Level entry     Home Layout: One level     Bathroom Shower/Tub: Tub/shower unit;Walk-in shower   Bathroom Toilet: Standard Bathroom Accessibility: Yes How Accessible: Accessible via walker Home Equipment: None          Prior Functioning/Environment Level of Independence: Independent        Comments: owns a Human resources officer business; drives        OT Problem List: Decreased strength;Decreased activity tolerance;Impaired balance (sitting and/or standing);Decreased cognition;Decreased safety awareness;Decreased knowledge of use of DME or AE;Cardiopulmonary status limiting activity      OT Treatment/Interventions:  Self-care/ADL training;Therapeutic exercise;Neuromuscular education;Energy conservation;DME and/or AE instruction;Therapeutic activities;Cognitive remediation/compensation;Patient/family education;Balance training    OT Goals(Current goals can be found in the care plan section) Acute Rehab OT Goals Patient Stated Goal: to get better adn back to normal OT Goal Formulation: With patient Time For Goal Achievement: 02/03/21 Potential to Achieve Goals: Good  OT Frequency: Min 2X/week   Barriers to D/C:            Co-evaluation              AM-PAC OT "6 Clicks" Daily Activity     Outcome Measure Help from another person eating meals?: None Help from another person taking care of personal grooming?: A Little Help from another person toileting, which includes using toliet, bedpan, or urinal?: A Little Help from another person bathing (including washing, rinsing, drying)?: A Little Help from another person to put on and taking off regular upper body clothing?: A Little Help from another person to put on and taking off regular lower body clothing?: A Little 6 Click Score: 19   End of Session Equipment Utilized During Treatment: Gait belt;Rolling walker Nurse Communication: Mobility status;Other (comment) (orthostatic hypotension)  Activity Tolerance: Patient tolerated treatment well Patient left: in chair;with call bell/phone within reach  OT Visit Diagnosis: Unsteadiness on feet (R26.81);Other abnormalities of gait and mobility (R26.89);Muscle weakness (generalized) (M62.81);Other symptoms and signs involving cognitive function  Time: 2330-0762 OT Time Calculation (min): 43 min Charges:  OT General Charges $OT Visit: 1 Visit OT Evaluation $OT Eval Moderate Complexity: 1 Mod OT Treatments $Self Care/Home Management : 8-22 mins $Therapeutic Exercise: 8-22 mins  Maurie Boettcher, OT/L   Acute OT Clinical Specialist [place] Pager  [phone] Office [phone]   [facility] 02/03/2021, 4:17 PM

## 2021-02-03 NOTE — Plan of Care (Signed)

## 2021-02-04 LAB — COMPREHENSIVE METABOLIC PANEL
ALT: 42 U/L (ref 0–44)
AST: 49 U/L — ABNORMAL HIGH (ref 15–41)
Albumin: 2.5 g/dL — ABNORMAL LOW (ref 3.5–5.0)
Alkaline Phosphatase: 57 U/L (ref 38–126)
Anion gap: 11 (ref 5–15)
BUN: 32 mg/dL — ABNORMAL HIGH (ref 8–23)
CO2: 23 mmol/L (ref 22–32)
Calcium: 8.5 mg/dL — ABNORMAL LOW (ref 8.9–10.3)
Chloride: 104 mmol/L (ref 98–111)
Creatinine, Ser: 0.83 mg/dL (ref 0.61–1.24)
GFR, Estimated: >60 mL/min (ref >60)
Glucose, Bld: 127 mg/dL — ABNORMAL HIGH (ref 70–99)
Potassium: 3.8 mmol/L (ref 3.5–5.1)
Sodium: 138 mmol/L (ref 135–145)
Total Bilirubin: 0.8 mg/dL (ref 0.3–1.2)
Total Protein: 5.1 g/dL — ABNORMAL LOW (ref 6.5–8.1)

## 2021-02-04 LAB — CBC WITH DIFFERENTIAL/PLATELET
Abs Immature Granulocytes: 0.07 10*3/uL (ref 0.00–0.07)
Basophils Absolute: 0 10*3/uL (ref 0.0–0.1)
Basophils Relative: 0 %
Eosinophils Absolute: 0 10*3/uL (ref 0.0–0.5)
Eosinophils Relative: 0 %
HCT: 28.5 % — ABNORMAL LOW (ref 39.0–52.0)
Hemoglobin: 9.2 g/dL — ABNORMAL LOW (ref 13.0–17.0)
Immature Granulocytes: 1 %
Lymphocytes Relative: 6 %
Lymphs Abs: 0.3 10*3/uL — ABNORMAL LOW (ref 0.7–4.0)
MCH: 29.1 pg (ref 26.0–34.0)
MCHC: 32.3 g/dL (ref 30.0–36.0)
MCV: 90.2 fL (ref 80.0–100.0)
Monocytes Absolute: 0.4 10*3/uL (ref 0.1–1.0)
Monocytes Relative: 6 %
Neutro Abs: 5.2 10*3/uL (ref 1.7–7.7)
Neutrophils Relative %: 87 %
Platelets: 146 10*3/uL — ABNORMAL LOW (ref 150–400)
RBC: 3.16 MIL/uL — ABNORMAL LOW (ref 4.22–5.81)
RDW: 18.7 % — ABNORMAL HIGH (ref 11.5–15.5)
WBC: 5.9 10*3/uL (ref 4.0–10.5)
nRBC: 0 % (ref 0.0–0.2)

## 2021-02-04 LAB — GLUCOSE, CAPILLARY
Glucose-Capillary: 89 mg/dL (ref 70–99)
Glucose-Capillary: 142 mg/dL — ABNORMAL HIGH (ref 70–99)
Glucose-Capillary: 232 mg/dL — ABNORMAL HIGH (ref 70–99)
Glucose-Capillary: 177 mg/dL — ABNORMAL HIGH (ref 70–99)

## 2021-02-04 MED ORDER — INSULIN GLARGINE 100 UNIT/ML ~~LOC~~ SOLN
4.0000 [IU] | Freq: Every day | SUBCUTANEOUS | Status: DC
  Filled 2021-02-04 (×2): qty 0.04

## 2021-02-04 NOTE — TOC Progression Note (Signed)
Transition of Care [facility]) - Progression Note    Patient Details  Name: [name] MRN: [id number] Date of Birth: Jan 04, 1946  Transition of Care [facility]) CM/SW [name], LCSW Phone Number: 02/04/2021, 5:42 PM  Clinical Narrative:    11:48am-CSW received call from patient's son, [name], regarding SNF facility. He requested CSW look into [facility] and [facility] as they are affiliated with [facility] and would be easier for patient to receive follow up care. CSW contacted [facility] [phone]). They reported that they are in inpatient rehab program and require patient to be recommended for IR. They also do not accept COVID positive patients. CSW then sent referral to [facility], [facility], and [facility]. [facility] unable to accept patient; no response from [facility]; [facility] able to accept patient if family is able to bring in [place] [facility]) and Orencia if needed (MD checking).   5:30pm-CSW contacted patient's son, [name], to make him aware of the updates along with the bed offers from [place] and [place]. He will consult with patient's other son and let CSW for sure in the morning. He requests [facility] for transport.    Expected Discharge Plan: Skilled Nursing Facility Barriers to Discharge: SNF Pending bed offer  Expected Discharge Plan and Services Expected Discharge Plan: [place] In-house Referral: Clinical Social Work Discharge Planning Services: CM Consult Post Acute Care Choice: [place] arrangements for the past 2 months: Single Family Home                                       Social Determinants of Health (SDOH) Interventions    Readmission Risk Interventions No flowsheet data found.

## 2021-02-04 NOTE — Progress Notes (Signed)
PT Cancellation Note  Patient Details Name: [name] MRN: [id number] DOB: 1946/01/04   Cancelled Treatment:    Reason Eval/Treat Not Completed: Patient declined, no reason specified.  Pt did not want to get up and walk right now, he was preoccupied with d/c and getting his "normal meds".  He seemed very tangential with circular thinking/conversation.  I am not sure what his normal cognition is like, but he reports he lives alone.  He had his O2 pulled off and was sating 95% on RA.  He was agreeable for PT to check back tomorrow.   Thanks,  Verdene Lennert, PT, DPT  Acute Rehabilitation [phone] pager #[phone] office       [name] 02/04/2021, 4:17 PM

## 2021-02-04 NOTE — Progress Notes (Signed)
Inpatient Diabetes Program Recommendations  AACE/ADA: New Consensus Statement on Inpatient Glycemic Control (2015)  Target Ranges:  Prepandial:   less than 140 mg/dL      [facility] postprandial:   less than 180 mg/dL (1-2 hours)      Critically ill patients:  140 - 180 mg/dL   Lab Results  Component Value Date   GLUCAP 89 02/04/2021   HGBA1C 6.7 (H) 01/27/2021    Review of Glycemic Control Results for OTTIS, VACHA (MRN [id number]) as of 02/04/2021 11:15  Ref. Range 02/03/2021 21:01 02/04/2021 07:42  Glucose-Capillary Latest Ref Range: 70 - 99 mg/dL 155 (H) 89   Diabetes history: Type 2 DM Current orders for Inpatient glycemic control: Lantus 4 units QD, Novolog 0-15 units TID, Novolog 0-5 units QHS Decadron 6 mg QD  Inpatient Diabetes Program Recommendations:    Noted new orders, patient refused AM dose of Lantus. Given current glucose trends: consider discontinuing Lantus 4 units QD and changing diet to carb modified.   Thanks, Bronson Curb, MSN, RNC-OB Diabetes Coordinator [phone] (8a-5p)

## 2021-02-04 NOTE — Progress Notes (Signed)
NAME:  [name], MRN:  [id number], DOB:  [birth year], LOS: 9 ADMISSION DATE:  01/26/2021  Subjective  Patient evaluated at bedside this AM. Patient reports he continues to feel weak, but breathing okay. Discussed plans for disposition, patient endorses concern over discharging from hospital over the next couple of days. States he and his family feels that he needs to stay in the hospital. Discussed with patient that he is medically stable for discharge and that it is not possible for him to complete rehabilitation while inpatient and medically stable. Will discuss further with his son, [name].   Objective   Blood pressure 135/68, pulse 72, temperature (!) 97.5 F (36.4 C), temperature source Axillary, resp. rate (!) 22, height 6\' 2"  (1.88 m), weight 80.5 kg, SpO2 97 %.     Intake/Output Summary (Last 24 hours) at 02/04/2021 9371 Last data filed at 02/03/2021 1955 Gross per 24 hour  Intake 600 ml  Output 450 ml  Net 150 ml   Filed Weights   01/26/21 2107  Weight: 80.5 kg   Physical Exam: General: Resting comfortable in bed, no acute distress Pulm: Normal work of breathing, no use of accessory muscles. Talking in complete sentences without difficulty. Neuro: Awake, alert. Moving extremities appropriately.  Labs    CBC Latest Ref Rng & Units 02/04/2021 02/03/2021 02/02/2021  WBC 4.0 - 10.5 K/uL 5.9 7.0 5.3  Hemoglobin 13.0 - 17.0 g/dL 9.2(L) 9.3(L) 9.3(L)  Hematocrit 39.0 - 52.0 % 28.5(L) 29.1(L) 29.0(L)  Platelets 150 - 400 K/uL 146(L) 157 65(L)   BMP Latest Ref Rng & Units 02/04/2021 02/03/2021 02/02/2021  Glucose 70 - 99 mg/dL 127(H) 163(H) 204(H)  BUN 8 - 23 mg/dL 32(H) 30(H) 28(H)  Creatinine 0.61 - 1.24 mg/dL 0.83 0.85 0.84  Sodium 135 - 145 mmol/L 138 139 137  Potassium 3.5 - 5.1 mmol/L 3.8 3.6 4.1  Chloride 98 - 111 mmol/L 104 103 105  CO2 22 - 32 mmol/L 23 22 20(L)  Calcium 8.9 - 10.3 mg/dL 8.5(L) 8.6(L) 8.4(L)    Consults:  Infectious disease Hematology  Significant  Diagnostic Tests:  CXR 1/31 > possible LLL opacity  Micro Data:  BCx 1/31 > NGTD COVID-19 1/31 > positive  Summary  [name] is 75yo person with ILD, RA on immunosuppressive medications, 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 He is medically ready for discharge to rehabilitation facility.  Assessment & Plan:  Principal Problem:   DIC (disseminated intravascular coagulation) ([place]) Active Problems:   Rheumatoid arthritis ([place])   COVID-19   AKI (acute kidney injury) ([place])   Pancytopenia ([place])   Immunosuppression ([place])   ILD (interstitial lung disease) ([place])   Diarrhea  #COVID-19 infection Patient doing well, but continues to desat with ambulation. Although he does not need oxygen while at rest, will need oxygen upon discharge. Will continue with 10 day course of steroids. Discussed disposition plans with patient's son, [name]. Discussed that while he is not currently stable to be home alone, he is medically stable for discharge to rehabilitation facility. Given his co-morbidities and immunosuppressive state, patient more likely to have further complications and delirium if unnecessarily stays in hospital. Patient's son verbalized understanding and plans on discussing with patient. Will continue to talk to [name] regarding disposition plans. - C/w Decadron 6mg  qd (  day 5) - C/w Robitussin q4h PRN, Tussionex q12h - Incentive spirometry, flutter valve - Daily CMP, CBC  #Normocytic anemia #DIC in setting of COVID-19 WBC, platelets normalizing. Will continue to hold immunosuppressive medications until we can discuss with his rheumatologist, [name], at [facility]. Patient to follow-up with PCP 1 week after discharge. - CBC 1 week s/p discharge  #ILD #RA Called  patient's rheumatologist office at [facility], unable to reach a physician or RN. Staff left note to return call. Will discuss plans for re-starting methotrexate and abatacept once patient is discharged. [facility]fev [facility] pharmacy [facility], will discuss with CSW regarding picking up the medication. - Discuss re-starting MTX, abatacept w/ [name] - Patient to [facility] at [facility] s/p discharge  Best practice:  DIET: Regular IVF: n/a DVT PPX: home Eliquis BOWEL: Senokot-S CODE: FULL FAM COM: Discussed with patient's son, [name], today.  Sanjuan Dame, MD Internal Medicine Resident PGY-1 PAGER: [phone] 02/04/2021 6:27 AM  If after hours (below), please contact on-call pager: [phone] 5PM-7AM Monday-Friday 1PM-7AM Saturday-Sunday

## 2021-02-04 NOTE — Care Management Important Message (Signed)
Important Message  Patient Details  Name: [name] MRN: [id number] Date of Birth: August 15, 1946   Medicare Important Message Given:  Yes - Important Message mailed due to current National Emergency   Verbal consent obtained due to current National Emergency  Relationship to patient: Self Contact Name: Mosi Hannold Call Date: 02/04/21  Time: 1433 Phone: [phone] Outcome: No Answer/Busy Important Message mailed to: Patient address on file    Delorse Lek 02/04/2021, 2:33 PM

## 2021-02-04 NOTE — Plan of Care (Signed)

## 2021-02-05 LAB — CBC WITH DIFFERENTIAL/PLATELET
Abs Immature Granulocytes: 0.12 10*3/uL — ABNORMAL HIGH (ref 0.00–0.07)
Basophils Absolute: 0 10*3/uL (ref 0.0–0.1)
Basophils Relative: 0 %
Eosinophils Absolute: 0.1 10*3/uL (ref 0.0–0.5)
Eosinophils Relative: 1 %
HCT: 28.1 % — ABNORMAL LOW (ref 39.0–52.0)
Hemoglobin: 9.3 g/dL — ABNORMAL LOW (ref 13.0–17.0)
Immature Granulocytes: 2 %
Lymphocytes Relative: 6 %
Lymphs Abs: 0.4 10*3/uL — ABNORMAL LOW (ref 0.7–4.0)
MCH: 29.7 pg (ref 26.0–34.0)
MCHC: 33.1 g/dL (ref 30.0–36.0)
MCV: 89.8 fL (ref 80.0–100.0)
Monocytes Absolute: 0.6 10*3/uL (ref 0.1–1.0)
Monocytes Relative: 9 %
Neutro Abs: 5.1 10*3/uL (ref 1.7–7.7)
Neutrophils Relative %: 82 %
Platelets: 186 10*3/uL (ref 150–400)
RBC: 3.13 MIL/uL — ABNORMAL LOW (ref 4.22–5.81)
RDW: 18.9 % — ABNORMAL HIGH (ref 11.5–15.5)
WBC: 6.2 10*3/uL (ref 4.0–10.5)
nRBC: 0 % (ref 0.0–0.2)

## 2021-02-05 LAB — COMPREHENSIVE METABOLIC PANEL
ALT: 42 U/L (ref 0–44)
AST: 45 U/L — ABNORMAL HIGH (ref 15–41)
Albumin: 2.5 g/dL — ABNORMAL LOW (ref 3.5–5.0)
Alkaline Phosphatase: 65 U/L (ref 38–126)
Anion gap: 12 (ref 5–15)
BUN: 24 mg/dL — ABNORMAL HIGH (ref 8–23)
CO2: 24 mmol/L (ref 22–32)
Calcium: 8.6 mg/dL — ABNORMAL LOW (ref 8.9–10.3)
Chloride: 102 mmol/L (ref 98–111)
Creatinine, Ser: 0.84 mg/dL (ref 0.61–1.24)
GFR, Estimated: >60 mL/min (ref >60)
Glucose, Bld: 137 mg/dL — ABNORMAL HIGH (ref 70–99)
Potassium: 3.6 mmol/L (ref 3.5–5.1)
Sodium: 138 mmol/L (ref 135–145)
Total Bilirubin: 0.8 mg/dL (ref 0.3–1.2)
Total Protein: 5.1 g/dL — ABNORMAL LOW (ref 6.5–8.1)

## 2021-02-05 LAB — GLUCOSE, CAPILLARY
Glucose-Capillary: 99 mg/dL (ref 70–99)
Glucose-Capillary: 158 mg/dL — ABNORMAL HIGH (ref 70–99)

## 2021-02-05 MED ORDER — DEXAMETHASONE 6 MG PO TABS
6.0000 mg | ORAL_TABLET | Freq: Every day | ORAL | 0 refills | Status: AC
Start: 2021-02-06 — End: 2021-02-10

## 2021-02-05 MED ORDER — METHOTREXATE 2.5 MG PO TABS: 15.0000 mg | ORAL_TABLET | ORAL | 0 refills | Status: AC

## 2021-02-05 MED ORDER — PREDNISONE 5 MG PO TABS: 5.0000 mg | ORAL_TABLET | Freq: Every day | ORAL | 0 refills | Status: DC

## 2021-02-05 MED ORDER — ABATACEPT 250 MG IV SOLR: 250.0000 mg | INTRAVENOUS | 12 refills | Status: DC

## 2021-02-05 NOTE — Progress Notes (Signed)
Called SNF at [phone], she transferred me to give report, phone rang endlessly and then disconnected.  Called back at above number and was given a direct line [phone].  Called the direct line which no-one answered, then the phone disconnected.  Per Message from [place], case management, she is calling [facility]

## 2021-02-05 NOTE — Plan of Care (Signed)
Patient is very irritable and short with staff.  Discussed the importance of d/c learning and patient stated he knew enough and asked me to leave his room.  Patient is ready for discharge.

## 2021-02-05 NOTE — TOC Transition Note (Signed)
Transition of Care [facility]) - CM/SW Discharge Note   Patient Details  Name: [name] MRN: [id number] Date of Birth: January 19, 1946  Transition of Care [facility]) CM/SW Contact:  [place], [place] Phone Number: 02/05/2021, 12:04 PM   Clinical Narrative:    Patient will DC to: [facility] Anticipated DC date: 02/05/21 Family notified: Yes, patients Son, [name] Transport by: Corey Harold   Per MD patient ready for DC to [place]. RN to call report prior to discharge [phone] room 803A). RN, patient, patient's family, and facility notified of DC. Discharge Summary and FL2 sent to facility. DC packet on chart. Ambulance transport requested for patient.   CSW will sign off for now as social work intervention is no longer needed. Please consult [place] again if new needs arise.     Final next level of care: Skilled Nursing Facility Barriers to Discharge: Barriers Resolved   Patient Goals and CMS Choice Patient states their goals for this hospitalization and ongoing recovery are:: go to SNF CMS Medicare.gov Compare Post Acute Care list provided to:: Patient Choice offered to / list presented to : [place]  Discharge Placement   Existing PASRR number confirmed : 02/05/21          Patient chooses bed at: [facility] Patient to be transferred to facility by: [place] Name of family member notified: Patients Son, [name] Patient and family notified of of transfer: 02/05/21  Discharge Plan and Services In-house Referral: Clinical Social Work Discharge Planning Services: [employer] Consult Post Acute Care Choice: [place]          DME Arranged: N/A           HH Agency: NA        Social Determinants of Health (SDOH) Interventions     Readmission Risk Interventions No flowsheet data found.

## 2021-02-05 NOTE — Progress Notes (Signed)
Sent chat message to [place], case management.  Pt. Ready for d/c on my end, awaiting response from [place] and time that [facility] will pick patient up.

## 2021-02-05 NOTE — TOC Progression Note (Addendum)
Transition of Care [facility]) - Progression Note    Patient Details  Name: ANDRIC KERCE MRN: [id number] Date of Birth: Apr 28, 1946  Transition of Care [facility]) CM/SW [place], LCSW Phone Number: 02/05/2021, 10:10 AM  Clinical Narrative:    CSW received call from patient's son asking which facility had a private room.  CSW let patient's son know that [facility] does not have a private room but [facility] and [facility] do. He requests CSW go ahead and move forward with [facility] and he will speak with the patient. CSW still awaiting notification from MD on Orencia injection.   CSW received update from MD; CSW let son know that Maureen Chatters will be resumed on 2/17 and family will need to take that to [place]. Son will have local [facility] [address] [facility] and take to [place].  Expected Discharge Plan: Skilled Nursing Facility Barriers to Discharge: SNF Pending bed offer  Expected Discharge Plan and Services Expected Discharge Plan: [place] In-house Referral: Clinical Social Work Discharge Planning Services: CM Consult Post Acute Care Choice: [place] arrangements for the past 2 months: Single Family Home                                       Social Determinants of Health (SDOH) Interventions    Readmission Risk Interventions No flowsheet data found.

## 2021-02-06 LAB — HISTOPLASMA ANTIGEN, URINE: Histoplasma Antigen, urine: <0.5 (ref <0.5)

## 2021-02-09 ENCOUNTER — Telehealth: Payer: Self-pay

## 2021-02-13 ENCOUNTER — Other Ambulatory Visit: Payer: Self-pay

## 2021-02-13 ENCOUNTER — Encounter: Payer: Self-pay | Admitting: Infectious Diseases

## 2021-02-13 ENCOUNTER — Ambulatory Visit (INDEPENDENT_AMBULATORY_CARE_PROVIDER_SITE_OTHER): Payer: Medicare Other | Admitting: Infectious Diseases

## 2021-02-13 DIAGNOSIS — R197 Diarrhea, unspecified: Secondary | ICD-10-CM

## 2021-02-13 DIAGNOSIS — U071 COVID-19: Secondary | ICD-10-CM

## 2021-02-13 DIAGNOSIS — D61818 Other pancytopenia: Secondary | ICD-10-CM

## 2021-02-13 NOTE — Patient Instructions (Addendum)
All the infection tests we did in the hospital are negative.   Your blood work has all returned to normal - suspect that this was due to COVID infection.   Please follow with your pulmonolgy, rheumatology and regular doctor team at [facility].   No further follow up here.

## 2021-02-13 NOTE — Progress Notes (Signed)
Patient: [name]  DOB: 1946-01-03 MRN: [id number] PCP: Elaina Pattee, MD     Subjective:  [name] is a 75 y.o. male here for hospital follow up.   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   During recent hospitalization he was found to have significant pancytopenia with unknown cause. Prior to this he had 2 admissions for PE/DVT then AKI/diarrhea. This most recent admission was preliminarily for diarrhea and fatigue/sore throat. Found to have COVID PCR (+) but mild symptoms > 7 days. Did not receive any COVID related treatment due to this.   No complaints since discharge - taking all medications as prescribed and has FU with [facility] care team soon.    Review of Systems  Constitutional: Negative for chills and fever.  HENT: Negative for tinnitus.   Eyes: Negative for blurred vision and photophobia.  Respiratory: Positive for cough and shortness of breath. Negative for sputum production.   Cardiovascular: Negative for chest pain.  Gastrointestinal: Negative for diarrhea, nausea and vomiting.  Genitourinary: Negative for dysuria.  Musculoskeletal: Negative for back pain and myalgias.  Skin: Negative for rash.  Neurological: Positive for weakness (generalized). Negative for headaches.    Past Medical History:  Diagnosis Date  . Aortic aneurysm ([place]) 05/2017  . Chronic back pain   . Diabetes mellitus without complication ([place])   . Dyslipidemia   . GERD (gastroesophageal reflux disease)   . H/O hiatal hernia   . Hypertension   . Melanoma ([place])    "back; left arm"  . PONV (postoperative nausea and vomiting)   . Rheumatoid arthritis ([place])   . Spasm of esophagus     Outpatient Medications Prior to Visit  Medication Sig Dispense Refill  . abatacept (ORENCIA) 250 MG injection Inject  250 mg into the vein every 30 (thirty) days. 1 each 12  . atorvastatin (LIPITOR) 20 MG tablet Take 20 mg by mouth daily.     . clopidogrel (PLAVIX) 75 MG tablet Take 1 tablet by mouth daily.     Marland Kitchen ELIQUIS 5 MG TABS tablet Take 5 mg by mouth 2 (two) times daily.    . ferrous sulfate 325 (65 FE) MG tablet Take 325 mg by mouth daily.    . irbesartan (AVAPRO) 150 MG tablet Take 150 mg by mouth daily.     . metFORMIN (GLUCOPHAGE) 500 MG tablet Take 500 mg by mouth daily with breakfast.     . methotrexate (RHEUMATREX) 2.5 MG tablet Take 6 tablets (15 mg total) by mouth once a week. Take 6 tablets (15 mg) once a week 24 tablet 0  . OFEV 100 MG CAPS Take 100 mg by mouth in the morning and at bedtime.    Marland Kitchen omeprazole (PRILOSEC) 20 MG capsule Take 20 mg by mouth daily.     . potassium chloride (K-DUR) 10 MEQ tablet Take 10 mEq by mouth daily.     . predniSONE (DELTASONE) 5 MG tablet Take 1 tablet (5 mg total) by mouth daily with breakfast. Take 1 tablet (5mg ) daily starting on 02/10/21. 30 tablet 0   Facility-Administered Medications Prior to Visit  Medication Dose Route Frequency Provider Last Rate Last Admin  . 0.9 %  sodium chloride infusion   Intravenous Once Lloyd Huger, MD      . 0.9 %  sodium chloride infusion  Intravenous Once Lloyd Huger, MD      . abatacept [facility]) 1,000 mg in sodium chloride 0.9 % 100 mL IVPB  1,000 mg Intravenous Q28 days Lloyd Huger, MD      . abatacept [facility]) 1,000 mg in sodium chloride 0.9 % 100 mL IVPB  1,000 mg Intravenous Q28 days Lloyd Huger, MD      . abatacept [facility]) 1,000 mg in sodium chloride 0.9 % 100 mL IVPB  1,000 mg Intravenous Q28 days Lloyd Huger, MD   Stopped at 02/23/19 1340     No Known Allergies  Social History   Tobacco Use  . Smoking status: Former Smoker    Packs/day: 1.00    Years: 46.00    Pack years: 46.00    Types: Cigarettes    Quit date: 11/26/2005    Years since quitting: 15.4  .  Smokeless tobacco: Never Used  Substance Use Topics  . Alcohol use: Yes    Alcohol/week: 9.0 standard drinks    Types: 9 Shots of liquor per week    Comment: 05/23/2014 "3, 1 shot drinks maybe 3 times/wk"  . Drug use: No    Family History  Problem Relation Age of Onset  . Heart disease Mother   . Heart disease Father   . Cancer Maternal Grandmother     Objective:   Vitals:   02/13/21 1122  BP: 112/71  Pulse: (!) 105   There is no height or weight on file to calculate BMI.  Physical Exam Vitals reviewed.  Constitutional:      Appearance: Normal appearance. He is not ill-appearing.     Comments: Seated in w/c. Appears to be in no distress.   Eyes:     General: No scleral icterus. Cardiovascular:     Rate and Rhythm: Normal rate and regular rhythm.     Pulses: Normal pulses.     Heart sounds: Normal heart sounds.  Pulmonary:     Effort: Pulmonary effort is normal.     Breath sounds: Normal breath sounds.  Abdominal:     General: Bowel sounds are normal. There is no distension.     Palpations: Abdomen is soft. There is no fluid wave, hepatomegaly or splenomegaly.     Tenderness: There is no abdominal tenderness.  Musculoskeletal:        General: No swelling.     Right lower leg: No edema.     Left lower leg: No edema.  Skin:    General: Skin is warm and dry.     Capillary Refill: Capillary refill takes less than 2 seconds.     Coloration: Skin is not jaundiced.     Findings: No rash.  Neurological:     Mental Status: He is alert and oriented to person, place, and time.  Psychiatric:        Behavior: Behavior normal.        Judgment: Judgment normal.     Lab Results: Lab Results  Component Value Date   WBC 6.2 02/05/2021   HGB 9.3 (L) 02/05/2021   HCT 28.1 (L) 02/05/2021   MCV 89.8 02/05/2021   PLT 186 02/05/2021    Lab Results  Component Value Date   CREATININE 0.84 02/05/2021   BUN 24 (H) 02/05/2021   NA 138 02/05/2021   K 3.6 02/05/2021   CL 102  02/05/2021   CO2 24 02/05/2021    Lab Results  Component Value Date   ALT 42 02/05/2021  AST 45 (H) 02/05/2021   ALKPHOS 65 02/05/2021   BILITOT 0.8 02/05/2021     Assessment & Plan:   Problem List Items Addressed This Visit      Unprioritized   Pancytopenia ([place])    These have all resolved back to baseline, presumed to be d/t acute COVID infection impact on cell lines. All atypical infection work up negative.  FU with regular medical care team.       Relevant Medications   ferrous sulfate 325 (65 FE) MG tablet   Diarrhea    Due to higher dose OFEV - he has FU with pulmonology to discuss going forward.        COVID-19    Recovered following recent acute infection with mild disease.           Janene Madeira, MSN, NP-C [facility] [place] Pager: [phone] Office: [phone]  04/27/21  8:37 AM

## 2021-02-13 NOTE — Assessment & Plan Note (Addendum)
These have all resolved back to baseline, presumed to be d/t acute COVID infection impact on cell lines. All atypical infection work up negative.  FU with regular medical care team.

## 2021-04-27 NOTE — Assessment & Plan Note (Signed)
Recovered following recent acute infection with mild disease.

## 2021-04-27 NOTE — Assessment & Plan Note (Signed)
Due to higher dose OFEV - he has FU with pulmonology to discuss going forward.

## 2021-09-15 ENCOUNTER — Telehealth: Payer: Self-pay | Admitting: Family Medicine

## 2022-04-16 ENCOUNTER — Other Ambulatory Visit: Payer: Self-pay | Admitting: Nurse Practitioner

## 2022-08-18 ENCOUNTER — Other Ambulatory Visit: Payer: Self-pay

## 2022-08-18 ENCOUNTER — Encounter (HOSPITAL_COMMUNITY): Payer: Self-pay

## 2022-08-18 ENCOUNTER — Inpatient Hospital Stay (HOSPITAL_COMMUNITY)
Admission: EM | Admit: 2022-08-18 | Discharge: 2022-08-27 | DRG: 377 | Disposition: A | Discharge status: Skilled Nursing Facility | Payer: Medicare Other | Attending: Internal Medicine | Admitting: Internal Medicine

## 2022-08-18 ENCOUNTER — Emergency Department (HOSPITAL_COMMUNITY): Payer: Medicare Other

## 2022-08-18 DIAGNOSIS — K920 Hematemesis: Secondary | ICD-10-CM | POA: Diagnosis not present

## 2022-08-18 DIAGNOSIS — Y92008 Other place in unspecified non-institutional (private) residence as the place of occurrence of the external cause: Secondary | ICD-10-CM

## 2022-08-18 DIAGNOSIS — D72829 Elevated white blood cell count, unspecified: Secondary | ICD-10-CM | POA: Diagnosis present

## 2022-08-18 DIAGNOSIS — Z86718 Personal history of other venous thrombosis and embolism: Secondary | ICD-10-CM

## 2022-08-18 DIAGNOSIS — S82402A Unspecified fracture of shaft of left fibula, initial encounter for closed fracture: Secondary | ICD-10-CM | POA: Diagnosis present

## 2022-08-18 DIAGNOSIS — K222 Esophageal obstruction: Secondary | ICD-10-CM

## 2022-08-18 DIAGNOSIS — K922 Gastrointestinal hemorrhage, unspecified: Secondary | ICD-10-CM | POA: Diagnosis not present

## 2022-08-18 DIAGNOSIS — K264 Chronic or unspecified duodenal ulcer with hemorrhage: Secondary | ICD-10-CM | POA: Diagnosis not present

## 2022-08-18 DIAGNOSIS — Z79899 Other long term (current) drug therapy: Secondary | ICD-10-CM

## 2022-08-18 DIAGNOSIS — E876 Hypokalemia: Secondary | ICD-10-CM | POA: Diagnosis present

## 2022-08-18 DIAGNOSIS — Z7984 Long term (current) use of oral hypoglycemic drugs: Secondary | ICD-10-CM

## 2022-08-18 DIAGNOSIS — I10 Essential (primary) hypertension: Secondary | ICD-10-CM | POA: Diagnosis present

## 2022-08-18 DIAGNOSIS — Z9049 Acquired absence of other specified parts of digestive tract: Secondary | ICD-10-CM

## 2022-08-18 DIAGNOSIS — L8990 Pressure ulcer of unspecified site, unspecified stage: Secondary | ICD-10-CM | POA: Insufficient documentation

## 2022-08-18 DIAGNOSIS — E860 Dehydration: Secondary | ICD-10-CM | POA: Diagnosis present

## 2022-08-18 DIAGNOSIS — R1013 Epigastric pain: Secondary | ICD-10-CM

## 2022-08-18 DIAGNOSIS — N179 Acute kidney failure, unspecified: Secondary | ICD-10-CM | POA: Diagnosis present

## 2022-08-18 DIAGNOSIS — D62 Acute posthemorrhagic anemia: Secondary | ICD-10-CM | POA: Diagnosis present

## 2022-08-18 DIAGNOSIS — M069 Rheumatoid arthritis, unspecified: Secondary | ICD-10-CM | POA: Diagnosis present

## 2022-08-18 DIAGNOSIS — Z85820 Personal history of malignant melanoma of skin: Secondary | ICD-10-CM

## 2022-08-18 DIAGNOSIS — S8262XA Displaced fracture of lateral malleolus of left fibula, initial encounter for closed fracture: Secondary | ICD-10-CM | POA: Diagnosis present

## 2022-08-18 DIAGNOSIS — K2101 Gastro-esophageal reflux disease with esophagitis, with bleeding: Secondary | ICD-10-CM | POA: Diagnosis present

## 2022-08-18 DIAGNOSIS — E871 Hypo-osmolality and hyponatremia: Secondary | ICD-10-CM | POA: Diagnosis present

## 2022-08-18 DIAGNOSIS — K449 Diaphragmatic hernia without obstruction or gangrene: Secondary | ICD-10-CM | POA: Diagnosis present

## 2022-08-18 DIAGNOSIS — W1830XA Fall on same level, unspecified, initial encounter: Secondary | ICD-10-CM | POA: Diagnosis present

## 2022-08-18 DIAGNOSIS — Z8249 Family history of ischemic heart disease and other diseases of the circulatory system: Secondary | ICD-10-CM

## 2022-08-18 DIAGNOSIS — R739 Hyperglycemia, unspecified: Secondary | ICD-10-CM

## 2022-08-18 DIAGNOSIS — K2100 Gastro-esophageal reflux disease with esophagitis, without bleeding: Secondary | ICD-10-CM

## 2022-08-18 DIAGNOSIS — Z7901 Long term (current) use of anticoagulants: Secondary | ICD-10-CM

## 2022-08-18 DIAGNOSIS — E785 Hyperlipidemia, unspecified: Secondary | ICD-10-CM | POA: Diagnosis present

## 2022-08-18 DIAGNOSIS — K921 Melena: Secondary | ICD-10-CM

## 2022-08-18 DIAGNOSIS — K2211 Ulcer of esophagus with bleeding: Secondary | ICD-10-CM | POA: Diagnosis present

## 2022-08-18 DIAGNOSIS — Z7952 Long term (current) use of systemic steroids: Secondary | ICD-10-CM

## 2022-08-18 DIAGNOSIS — R112 Nausea with vomiting, unspecified: Secondary | ICD-10-CM

## 2022-08-18 DIAGNOSIS — Z7902 Long term (current) use of antithrombotics/antiplatelets: Secondary | ICD-10-CM

## 2022-08-18 DIAGNOSIS — N19 Unspecified kidney failure: Secondary | ICD-10-CM

## 2022-08-18 DIAGNOSIS — K209 Esophagitis, unspecified without bleeding: Secondary | ICD-10-CM

## 2022-08-18 DIAGNOSIS — R350 Frequency of micturition: Secondary | ICD-10-CM | POA: Diagnosis present

## 2022-08-18 DIAGNOSIS — E1165 Type 2 diabetes mellitus with hyperglycemia: Secondary | ICD-10-CM | POA: Diagnosis present

## 2022-08-18 DIAGNOSIS — R531 Weakness: Secondary | ICD-10-CM

## 2022-08-18 DIAGNOSIS — S0081XA Abrasion of other part of head, initial encounter: Secondary | ICD-10-CM | POA: Diagnosis present

## 2022-08-18 DIAGNOSIS — R296 Repeated falls: Secondary | ICD-10-CM | POA: Diagnosis present

## 2022-08-18 DIAGNOSIS — Z809 Family history of malignant neoplasm, unspecified: Secondary | ICD-10-CM

## 2022-08-18 DIAGNOSIS — K269 Duodenal ulcer, unspecified as acute or chronic, without hemorrhage or perforation: Secondary | ICD-10-CM

## 2022-08-18 DIAGNOSIS — Z608 Other problems related to social environment: Secondary | ICD-10-CM | POA: Diagnosis present

## 2022-08-18 LAB — I-STAT CHEM 8, ED
BUN: >130 mg/dL — ABNORMAL HIGH (ref 8–23)
Calcium, Ion: 0.98 mmol/L — ABNORMAL LOW (ref 1.15–1.40)
Chloride: 89 mmol/L — ABNORMAL LOW (ref 98–111)
Creatinine, Ser: 2.8 mg/dL — ABNORMAL HIGH (ref 0.61–1.24)
Glucose, Bld: 356 mg/dL — ABNORMAL HIGH (ref 70–99)
HCT: 34 % — ABNORMAL LOW (ref 39.0–52.0)
Hemoglobin: 11.6 g/dL — ABNORMAL LOW (ref 13.0–17.0)
Potassium: 3.7 mmol/L (ref 3.5–5.1)
Sodium: 126 mmol/L — ABNORMAL LOW (ref 135–145)
TCO2: 23 mmol/L (ref 22–32)

## 2022-08-18 LAB — COMPREHENSIVE METABOLIC PANEL
ALT: 13 U/L (ref 0–44)
AST: 16 U/L (ref 15–41)
Albumin: 3.1 g/dL — ABNORMAL LOW (ref 3.5–5.0)
Alkaline Phosphatase: 66 U/L (ref 38–126)
Anion gap: 20 — ABNORMAL HIGH (ref 5–15)
BUN: 134 mg/dL — ABNORMAL HIGH (ref 8–23)
CO2: 20 mmol/L — ABNORMAL LOW (ref 22–32)
Calcium: 8.5 mg/dL — ABNORMAL LOW (ref 8.9–10.3)
Chloride: 89 mmol/L — ABNORMAL LOW (ref 98–111)
Creatinine, Ser: 2.87 mg/dL — ABNORMAL HIGH (ref 0.61–1.24)
GFR, Estimated: 22 mL/min — ABNORMAL LOW (ref >60)
Glucose, Bld: 347 mg/dL — ABNORMAL HIGH (ref 70–99)
Potassium: 3.4 mmol/L — ABNORMAL LOW (ref 3.5–5.1)
Sodium: 129 mmol/L — ABNORMAL LOW (ref 135–145)
Total Bilirubin: 0.5 mg/dL (ref 0.3–1.2)
Total Protein: 5.9 g/dL — ABNORMAL LOW (ref 6.5–8.1)

## 2022-08-18 LAB — CBG MONITORING, ED: Glucose-Capillary: 234 mg/dL — ABNORMAL HIGH (ref 70–99)

## 2022-08-18 LAB — CBC
HCT: 29.4 % — ABNORMAL LOW (ref 39.0–52.0)
Hemoglobin: 10.5 g/dL — ABNORMAL LOW (ref 13.0–17.0)
MCH: 32.6 pg (ref 26.0–34.0)
MCHC: 35.7 g/dL (ref 30.0–36.0)
MCV: 91.3 fL (ref 80.0–100.0)
Platelets: 274 10*3/uL (ref 150–400)
RBC: 3.22 MIL/uL — ABNORMAL LOW (ref 4.22–5.81)
RDW: 14.2 % (ref 11.5–15.5)
WBC: 21.3 10*3/uL — ABNORMAL HIGH (ref 4.0–10.5)
nRBC: 0 % (ref 0.0–0.2)

## 2022-08-18 LAB — POC OCCULT BLOOD, ED: Fecal Occult Bld: POSITIVE — AB

## 2022-08-18 LAB — PREPARE RBC (CROSSMATCH)

## 2022-08-18 MED ORDER — SODIUM CHLORIDE 0.9 % IV BOLUS
1000.0000 mL | Freq: Once | INTRAVENOUS | Status: AC
  Administered 2022-08-18: 1000 mL via INTRAVENOUS

## 2022-08-18 MED ORDER — SODIUM CHLORIDE 0.9 % IV SOLN
500.0000 mg | Freq: Once | INTRAVENOUS | Status: AC
  Administered 2022-08-18: 500 mg via INTRAVENOUS
  Filled 2022-08-18: qty 5

## 2022-08-18 MED ORDER — PANTOPRAZOLE 80MG IVPB - SIMPLE MED
80.0000 mg | Freq: Once | INTRAVENOUS | Status: AC
  Administered 2022-08-18: 80 mg via INTRAVENOUS
  Filled 2022-08-18: qty 80

## 2022-08-18 MED ORDER — HYDROMORPHONE HCL 1 MG/ML IJ SOLN
0.5000 mg | Freq: Once | INTRAMUSCULAR | Status: AC
  Administered 2022-08-18: 0.5 mg via INTRAVENOUS
  Filled 2022-08-18: qty 1

## 2022-08-18 MED ORDER — SODIUM CHLORIDE 0.9% IV SOLUTION
Freq: Once | INTRAVENOUS | Status: AC
Start: 2022-08-18 — End: 2022-08-18

## 2022-08-18 MED ORDER — PANTOPRAZOLE INFUSION (NEW) - SIMPLE MED
8.0000 mg/h | INTRAVENOUS | Status: DC
  Administered 2022-08-18 – 2022-08-20 (×6): 8 mg/h via INTRAVENOUS
  Filled 2022-08-18 (×2): qty 80
  Filled 2022-08-18: qty 100
  Filled 2022-08-18: qty 80
  Filled 2022-08-18: qty 100
  Filled 2022-08-18 (×3): qty 80

## 2022-08-18 MED ORDER — INSULIN GLARGINE-YFGN 100 UNIT/ML ~~LOC~~ SOLN
10.0000 [IU] | Freq: Every day | SUBCUTANEOUS | Status: DC
Start: 2022-08-18 — End: 2022-08-19
  Filled 2022-08-18: qty 0.1

## 2022-08-18 MED ORDER — ONDANSETRON HCL 4 MG/2ML IJ SOLN
4.0000 mg | Freq: Once | INTRAMUSCULAR | Status: AC
  Administered 2022-08-18: 4 mg via INTRAVENOUS
  Filled 2022-08-18: qty 2

## 2022-08-18 MED ORDER — SODIUM CHLORIDE 0.9 % IV SOLN
1.0000 g | Freq: Once | INTRAVENOUS | Status: AC
  Administered 2022-08-18: 1 g via INTRAVENOUS
  Filled 2022-08-18: qty 10

## 2022-08-18 MED ORDER — PANTOPRAZOLE SODIUM 40 MG IV SOLR
40.0000 mg | Freq: Two times a day (BID) | INTRAVENOUS | Status: DC
  Administered 2022-08-22 – 2022-08-25 (×8): 40 mg via INTRAVENOUS
  Filled 2022-08-18 (×8): qty 10

## 2022-08-18 MED ORDER — SODIUM CHLORIDE 0.9 % IV SOLN
INTRAVENOUS | Status: AC
Start: 2022-08-18 — End: 2022-08-19

## 2022-08-18 NOTE — ED Notes (Addendum)
RN assumed care of pt, no complaints.  He had emesis on his sheets, stated "that was from before, I feel better."  Pt given new linens and call bell.  No complaints at this time.  Pt alert and oriented, "I'm just weak."

## 2022-08-18 NOTE — ED Notes (Signed)
Assumed care of patient.

## 2022-08-18 NOTE — H&P (View-Only) (Signed)
Consultation Note   Referring Provider:  Internal Medicine Teaching Service PCP: Elaina Pattee, MD Primary Gastroenterologist: Hilbert Corrigan, MD  [facility] Reason for consultation: GI bleed  Hospital Day: 1  Assessment / Plan   # 76 yo male with epigastric pain and upper GI bleed (hematemesis, recent black stools) on Plavix and Eliquis. FOBT + black stool on EDP's exam. His BUN is > 130. Rule out PUD.  He needs an EGD, will plan for tomorrow.  The risks and benefits of EGD with possible biopsies were discussed with the patient who agrees to proceed.  PPI infusion Monitor H+H. Receiving a unit of PRBC's now.  His last doses of Plavix, Eliquis were apparently a couple of weeks ago (hasn't tolerated PO)  #  See PMH for additional medical problems   Attending Physician Note   I have taken a history, reviewed the chart and examined the patient. I performed more than 50% of this encounter in conjunction with the APP. I agree with the APP's note, impression and recommendations with my edits. My additional impressions and recommendations are as follows.   *UGI bleed with melena, N/V, coffee ground emesis, epigastric pain. CT today shows a thickened distal esophagus, fluid-filled. History of LA Grade B erosive esophagitis and recurrent esophageal strictures requiring multiple EGDs with dilations, followed at [facility]. Pantoprazole IV, NPO and EGD planned for tomorrow morning.   *ABL anemia. Trend CBC.   *Chronic DVTs, hold Eliquis and Plavix   *Hyponatremia, hypocalcemia - per primary service   *AKI, dehydration - IV volume resuscitation   *Hyperglycemia - per primary service   *Leukocytosis - per primary service  [name], MD [facility] See AMION, [facility], for our on call provider     HPI   [name] is a 76 y.o. male with a past medical history significant for RA, chronic DVT, hiatal hernia, erosive esophagitis /  esophageal stricture requiring multiple dilations, diverticulosis, DM.  See PMH for any additional medical problems.  Patient has been followed long term by [facility] for esophageal strictures requiring dilation.   He was brought by EMS to ED today for evaluation of tarry stools and dizziness.  Patient is a limited historian. He tells me that he has had nausea / vomiting for two weeks along with sharp epigastric pain. He doesn't know if the pain is worse with eating because he hasn't been eating. He hasn't had any recent bowel movements but his stools have been black for unknown length of time. He hasn't had Plavix in two weeks due the vomiting. He has been dizzy and overall not feeling well for the last two weeks. He doesn't really describe significant dysphagia but does regurgitate what he eats.   ED evaluation:  Mild tachycardia WBC 21.3 Hgb 10.5 BUN > 130, Cr 2.8. GFR 22 EDP exam + Black, heme positive stool Non-contrast CT AP >> fluid-filled thickened distal esophagus suggesting esophagitis.   His Hgb has actually come up to 11.6 without transfusion and he is now getting a unit of blood    Previous GI Evaluation     Multiple EGDs at [facility] for treatment of esophageal strictures   Recent Labs and Imaging CT Abdomen Pelvis Wo Contrast  Result  Date: 08/18/2022 CLINICAL DATA:  Abdominal pain, acute, nonlocalized gi bleeding EXAM: CT ABDOMEN AND PELVIS WITHOUT CONTRAST TECHNIQUE: Multidetector CT imaging of the abdomen and pelvis was performed following the standard protocol without IV contrast. RADIATION DOSE REDUCTION: This exam was performed according to the departmental dose-optimization program which includes automated exposure control, adjustment of the mA and/or kV according to patient size and/or use of iterative reconstruction technique. COMPARISON:  05/28/2015 FINDINGS: Lower chest: Fibrotic changes within the bilateral lung bases with bronchiectasis, progressed from prior. Small  superimposed opacity at the right lung base posteriorly may represent atelectasis or consolidation. Heart size within normal limits. Abnormal appearance of the distal esophagus which is circumferentially thickened and fluid-filled (series 3, image 11). Extensive aortic and coronary artery atherosclerosis. Prominent bilateral gynecomastia. Hepatobiliary: No focal liver abnormality is seen. Status post cholecystectomy. No biliary dilatation. Pancreas: Unremarkable. No pancreatic ductal dilatation or surrounding inflammatory changes. Spleen: Normal in size without focal abnormality. Adrenals/Urinary Tract: Unremarkable adrenal glands. 4.5 cm right renal cyst, which requires no follow-up. 6 mm nonobstructing stone within the lower pole of the left kidney. No hydronephrosis. Urinary bladder is incompletely distended. Stomach/Bowel: Stomach is fluid distended but appears otherwise unremarkable. Appendix appears normal. Scattered colonic diverticulosis. No evidence of bowel wall thickening, distention, or inflammatory changes. Vascular/Lymphatic: Interval aortobifemoral stent grafting, suboptimally assessed in the absence of contrast. Advanced atherosclerosis of the native aorta. No abdominopelvic lymphadenopathy. Reproductive: Prostate is unremarkable. Other: No free fluid. No abdominopelvic fluid collection. No pneumoperitoneum. No abdominal wall hernia. Musculoskeletal: Chronic posttraumatic deformities of the left hemipelvis. Prior left femoral ORIF. No acute bony findings. IMPRESSION: 1. Abnormal appearance of the distal esophagus which is circumferentially thickened and fluid-filled. Findings are suggestive of esophagitis. Further evaluation with upper endoscopy is recommended. 2. Fibrotic changes within the bilateral lung bases with bronchiectasis, progressed from prior study. Small superimposed opacity at the right lung base posteriorly may represent atelectasis or pneumonia. 3. Nonobstructing left nephrolithiasis.  4. Colonic diverticulosis without evidence for acute diverticulitis. 5. Interval aortobifemoral stent grafting, suboptimally assessed in the absence of contrast. 6. Aortic and coronary artery atherosclerosis (ICD10-I70.0). Electronically Signed   By: Davina Poke D.O.   On: 08/18/2022 16:17    Labs:  Recent Labs    08/18/22 1418 08/18/22 1508  WBC 21.3*  --   HGB 10.5* 11.6*  HCT 29.4* 34.0*  PLT 274  --    Recent Labs    08/18/22 1418 08/18/22 1508  NA 129* 126*  K 3.4* 3.7  CL 89* 89*  CO2 20*  --   GLUCOSE 347* 356*  BUN 134* >130*  CREATININE 2.87* 2.80*  CALCIUM 8.5*  --    Recent Labs    08/18/22 1418  PROT 5.9*  ALBUMIN 3.1*  AST 16  ALT 13  ALKPHOS 32  BILITOT 0.5    Past Medical History:  Diagnosis Date   Aortic aneurysm ([place]) 05/2017   Chronic back pain    Diabetes mellitus without complication ([facility])    Dyslipidemia    GERD (gastroesophageal reflux disease)    H/O hiatal hernia    Hypertension    Melanoma ([place])    "back; left arm"   PONV (postoperative nausea and vomiting)    Rheumatoid arthritis ([place])    Spasm of esophagus     Past Surgical History:  Procedure Laterality Date   aneursym repair     aneursym repair and stent placement  05/2017   CARPAL TUNNEL RELEASE Bilateral    COLON SURGERY  2007   removed 18" of colon   ERCP W/ METAL STENT PLACEMENT  11/2005   Archie Endo 12/14/2005   ESOPHAGOGASTRODUODENOSCOPY (EGD) WITH ESOPHAGEAL DILATION     "several times'   HAND SURGERY Bilateral    "plastic knuckles"   LAPAROSCOPIC CHOLECYSTECTOMY  01/2006   MELANOMA EXCISION     "back; left arm"   NASAL SEPTUM [employer]   "busted in 2 places"   RESECTION DISTAL CLAVICAL Right 05/13/2016   Procedure: DISTAL CLAVICLE EXCISION;  Surgeon: Ninetta Lights, MD;  Location: [place];  Service: Orthopedics;  Laterality: Right;   SHOULDER ARTHROSCOPY WITH ROTATOR CUFF REPAIR AND SUBACROMIAL DECOMPRESSION  Right 05/13/2016   Procedure: RIGHT SHOULDER SCOPE DEBRIDEMENT, ACROMIOPLASTY, ROTATOR CUFF REPAIR; RELEASE BICEPS TENDON AND DEBRIDEMENT LABRUM;  Surgeon: Ninetta Lights, MD;  Location: [place];  Service: Orthopedics;  Laterality: Right;   TIBIA FRACTURE SURGERY Left 2006   "put a steel rod in it"    Family History  Problem Relation Age of Onset   Heart disease Mother    Heart disease Father    Cancer Maternal Grandmother     Prior to Admission medications   Medication Sig Start Date End Date Taking? Authorizing Provider  abatacept (ORENCIA) 250 MG injection Inject 250 mg into the vein every 30 (thirty) days. 02/12/21   Sanjuan Dame, MD  atorvastatin (LIPITOR) 20 MG tablet Take 20 mg by mouth daily.     [provider]  atorvastatin (LIPITOR) 40 MG tablet Take 40 mg by mouth daily. 06/25/22   [provider]  clopidogrel (PLAVIX) 75 MG tablet Take 1 tablet by mouth daily.  01/31/20   [provider]  ELIQUIS 5 MG TABS tablet Take 5 mg by mouth 2 (two) times daily. 12/31/20   [provider]  enoxaparin (LOVENOX) 40 MG/0.4ML injection Inject into the skin. 08/06/22   [provider]  famotidine (PEPCID) 40 MG/5ML suspension Take by mouth. 07/23/22   [provider]  ferrous sulfate 325 (65 FE) MG tablet Take 325 mg by mouth daily. 10/25/20   [provider]  folic acid (FOLVITE) 1 MG tablet Take 1 mg by mouth daily. 07/31/22   [provider]  irbesartan (AVAPRO) 150 MG tablet Take 150 mg by mouth daily.  07/26/19   [provider]  levothyroxine (SYNTHROID) 50 MCG tablet Take 50 mcg by mouth daily. 05/29/22   [provider]  metFORMIN (GLUCOPHAGE) 1000 MG tablet Take 1,000 mg by mouth daily. 05/28/22   [provider]  metFORMIN (GLUCOPHAGE) 500 MG tablet Take 500 mg by mouth daily with breakfast.     [provider]  OFEV 100 MG CAPS Take 100 mg by mouth in the morning  and at bedtime. 01/19/21   [provider]  omeprazole (PRILOSEC) 20 MG capsule Take 20 mg by mouth daily.  07/15/15   [provider]  pantoprazole (PROTONIX) 40 MG tablet Take 40 mg by mouth daily. 07/31/22   [provider]  potassium chloride (K-DUR) 10 MEQ tablet Take 10 mEq by mouth daily.  01/06/16   [provider]  predniSONE (DELTASONE) 5 MG tablet Take 1 tablet (5 mg total) by mouth daily with breakfast. Take 1 tablet ('5mg'$ ) daily starting on 02/10/21. 02/10/21   Sanjuan Dame, MD    Current Facility-Administered Medications  Medication Dose Route Frequency Provider Last Rate Last Admin   0.9 %  sodium  chloride infusion (Manually program via Guardrails IV Fluids)   Intravenous Once Lajean Saver, MD       Derrill Memo ON 08/22/2022] pantoprazole (PROTONIX) injection 40 mg  40 mg Intravenous Q12H Lajean Saver, MD       pantoprozole (PROTONIX) 80 mg /NS 100 mL infusion  8 mg/hr Intravenous Continuous Lajean Saver, MD 10 mL/hr at 08/18/22 1527 8 mg/hr at 08/18/22 1527   Current Outpatient Medications  Medication Sig Dispense Refill   abatacept (ORENCIA) 250 MG injection Inject 250 mg into the vein every 30 (thirty) days. 1 each 12   atorvastatin (LIPITOR) 20 MG tablet Take 20 mg by mouth daily.      atorvastatin (LIPITOR) 40 MG tablet Take 40 mg by mouth daily.     clopidogrel (PLAVIX) 75 MG tablet Take 1 tablet by mouth daily.      ELIQUIS 5 MG TABS tablet Take 5 mg by mouth 2 (two) times daily.     enoxaparin (LOVENOX) 40 MG/0.4ML injection Inject into the skin.     famotidine (PEPCID) 40 MG/5ML suspension Take by mouth.     ferrous sulfate 325 (65 FE) MG tablet Take 325 mg by mouth daily.     folic acid (FOLVITE) 1 MG tablet Take 1 mg by mouth daily.     irbesartan (AVAPRO) 150 MG tablet Take 150 mg by mouth daily.      levothyroxine (SYNTHROID) 50 MCG tablet Take 50 mcg by mouth daily.     metFORMIN (GLUCOPHAGE) 1000 MG tablet Take 1,000 mg by mouth  daily.     metFORMIN (GLUCOPHAGE) 500 MG tablet Take 500 mg by mouth daily with breakfast.      OFEV 100 MG CAPS Take 100 mg by mouth in the morning and at bedtime.     omeprazole (PRILOSEC) 20 MG capsule Take 20 mg by mouth daily.      pantoprazole (PROTONIX) 40 MG tablet Take 40 mg by mouth daily.     potassium chloride (K-DUR) 10 MEQ tablet Take 10 mEq by mouth daily.      predniSONE (DELTASONE) 5 MG tablet Take 1 tablet (5 mg total) by mouth daily with breakfast. Take 1 tablet ('5mg'$ ) daily starting on 02/10/21. 30 tablet 0    Allergies as of 08/18/2022   (No Known Allergies)    Social History   Socioeconomic History   Marital status: Divorced    Spouse name: Not on file   Number of children: Not on file   Years of education: Not on file   Highest education level: Not on file  Occupational History   Not on file  Tobacco Use   Smoking status: Former    Packs/day: 1.00    Years: 46.00    Total pack years: 46.00    Types: Cigarettes    Quit date: 11/26/2005    Years since quitting: 16.7   Smokeless tobacco: Never  Substance and Sexual Activity   Alcohol use: Yes    Alcohol/week: 9.0 standard drinks of alcohol    Types: 9 Shots of liquor per week    Comment: 05/23/2014 "3, 1 shot drinks maybe 3 times/wk"   Drug use: No   Sexual activity: Not Currently  Other Topics Concern   Not on file  Social History Narrative   Not on file   Social Determinants of Health   Financial Resource Strain: Not on file  Food Insecurity: Not on file  Transportation Needs: Not on file  Physical Activity: Not on file  Stress: Not on file  Social Connections: Not on file  Intimate Partner Violence: Not on file    Review of Systems: All systems reviewed and negative except where noted in HPI.  Physical Exam: Vital signs in last 24 hours: Temp:  [97 F (36.1 C)] 97 F (36.1 C) (08/23 1422) Pulse Rate:  [92-103] 92 (08/23 1600) Resp:  [16-28] 20 (08/23 1600) BP: (119-149)/(63-84)  149/71 (08/23 1600) SpO2:  [96 %-100 %] 100 % (08/23 1600)   General:  Alert thin male in NAD. Dark dried blood around his lips.  Psych:  Pleasant, cooperative. Normal mood and affect Eyes: Pupils equal, no icterus. Conjunctive pink Ears:  Normal auditory acuity Nose: No deformity, discharge or lesions Neck:  Supple, no masses felt Lungs:  Clear to auscultation.  Heart:  Regular rate, regular rhythm. Abdomen:  Soft, nondistended, nontender, active bowel sounds, no masses felt Rectal :  Deferred Msk: Symmetrical without gross deformities.  Neurologic:  Alert, oriented, grossly normal neurologically Skin:  Intact without significant lesions.   Intake/Output from previous day: No intake/output data recorded. Intake/Output this shift:  No intake/output data recorded.    Active Problems:   * No active hospital problems. Tye Savoy, NP-C @  08/18/2022, 4:07 PM

## 2022-08-18 NOTE — ED Triage Notes (Signed)
Pt BIB [facility] from home where he had been having tarry stools, weakness, dizziness & decreased PO intake for over the past week. Has not been out of bed for the past week as well. Does have Hx of GI ulcers, Diabetes & CA. A/Ox4, VSS.

## 2022-08-18 NOTE — ED Notes (Signed)
Patient was repositioned in bed and changed into a gown. Patient is alert and oriented x 2. Patient was not oriented to month or location. Patient complains of pain in the throat

## 2022-08-18 NOTE — ED Provider Notes (Addendum)
[facility] EMERGENCY DEPARTMENT Provider Note   CSN: [id number] Arrival date & time: 08/18/22  1414     History  Chief Complaint  Patient presents with   Tarry Stools   Weakness    [name] is a 76 y.o. male.  Pt c/o general weakness and dark/black stools in past two weeks. Symptoms acute onset, moderate, persistent, slowly worse. Has a couple episodes of nausea/vomiting, dark, denies grossly bloody emesis. No emesis today. Epigastric/upper abd pain, dull, non radiating, without specific exacerbating or alleviating factors. On anticoag therapy. No other abnormal bruising or bleeding. Hx gib/gastric erosion. No nsaid use. No fever or chills. Lightheaded when stands. Denies etoh use.   The history is provided by the patient, medical records and the EMS personnel.  Weakness Associated symptoms: abdominal pain, nausea and vomiting   Associated symptoms: no chest pain, no dysuria, no fever, no headaches and no shortness of breath        Home Medications Prior to Admission medications   Medication Sig Start Date End Date Taking? Authorizing Provider  abatacept (ORENCIA) 250 MG injection Inject 250 mg into the vein every 30 (thirty) days. 02/12/21   Sanjuan Dame, MD  atorvastatin (LIPITOR) 20 MG tablet Take 20 mg by mouth daily.     [provider]  clopidogrel (PLAVIX) 75 MG tablet Take 1 tablet by mouth daily.  01/31/20   [provider]  ELIQUIS 5 MG TABS tablet Take 5 mg by mouth 2 (two) times daily. 12/31/20   [provider]  ferrous sulfate 325 (65 FE) MG tablet Take 325 mg by mouth daily. 10/25/20   [provider]  irbesartan (AVAPRO) 150 MG tablet Take 150 mg by mouth daily.  07/26/19   [provider]  metFORMIN (GLUCOPHAGE) 500 MG tablet Take 500 mg by mouth daily with breakfast.     [provider]  OFEV 100 MG CAPS Take 100 mg by mouth in the morning and at bedtime. 01/19/21   [provider]  omeprazole (PRILOSEC) 20 MG capsule Take 20 mg by mouth daily.  07/15/15   [provider]  potassium chloride (K-DUR) 10 MEQ tablet Take 10 mEq by mouth daily.  01/06/16   [provider]  predniSONE (DELTASONE) 5 MG tablet Take 1 tablet (5 mg total) by mouth daily with breakfast. Take 1 tablet ('5mg'$ ) daily starting on 02/10/21. 02/10/21   Sanjuan Dame, MD      Allergies    Patient has no known allergies.    Review of Systems   Review of Systems  Constitutional:  Negative for chills and fever.  HENT:  Negative for sore throat.   Eyes:  Negative for redness.  Respiratory:  Negative for shortness of breath.   Cardiovascular:  Negative for chest pain.  Gastrointestinal:  Positive for abdominal pain, blood in stool, nausea and vomiting.  Genitourinary:  Negative for dysuria, flank pain and hematuria.  Musculoskeletal:  Negative for back pain and neck pain.  Skin:  Negative for rash.  Neurological:  Positive for weakness and light-headedness. Negative for headaches.  Psychiatric/Behavioral:  Negative for confusion.     Physical Exam Updated Vital Signs BP 126/73   Pulse 99   Temp (!) 97 F (36.1 C) (Temporal)   Resp 20   SpO2 100%  Physical Exam Vitals and nursing note reviewed.  Constitutional:      Appearance: Normal appearance. He is well-developed.  HENT:     Head: Atraumatic.  Nose: Nose normal.     Mouth/Throat:     Mouth: Mucous membranes are moist.     Pharynx: Oropharynx is clear.  Eyes:     General: No scleral icterus.    Conjunctiva/sclera: Conjunctivae normal.  Neck:     Trachea: No tracheal deviation.  Cardiovascular:     Rate and Rhythm: Normal rate and regular rhythm.     Pulses: Normal pulses.     Heart sounds: Normal heart sounds. No murmur heard.    No friction rub. No gallop.  Pulmonary:     Effort: Pulmonary effort is normal. No accessory muscle usage or respiratory distress.     Breath sounds: Normal breath  sounds.  Abdominal:     General: Bowel sounds are normal. There is no distension.     Palpations: Abdomen is soft. There is no mass.     Tenderness: There is abdominal tenderness. There is no guarding.     Comments: Epigastric tenderness.   Genitourinary:    Comments: No cva tenderness. Black stool, heme positive.  Musculoskeletal:        General: No swelling or tenderness.     Cervical back: Normal range of motion and neck supple. No rigidity.     Right lower leg: No edema.     Left lower leg: No edema.  Skin:    General: Skin is warm and dry.     Findings: No rash.  Neurological:     Mental Status: He is alert.     Comments: Alert, speech clear.   Psychiatric:        Mood and Affect: Mood normal.     ED Results / Procedures / Treatments   Labs (all labs ordered are listed, but only abnormal results are displayed) Results for orders placed or performed during the hospital encounter of 08/18/22  CBC  Result Value Ref Range   WBC 21.3 (H) 4.0 - 10.5 K/uL   RBC 3.22 (L) 4.22 - 5.81 MIL/uL   Hemoglobin 10.5 (L) 13.0 - 17.0 g/dL   HCT 29.4 (L) 39.0 - 52.0 %   MCV 91.3 80.0 - 100.0 fL   MCH 32.6 26.0 - 34.0 pg   MCHC 35.7 30.0 - 36.0 g/dL   RDW 14.2 11.5 - 15.5 %   Platelets 274 150 - 400 K/uL   nRBC 0.0 0.0 - 0.2 %  Comprehensive metabolic panel  Result Value Ref Range   Sodium 129 (L) 135 - 145 mmol/L   Potassium 3.4 (L) 3.5 - 5.1 mmol/L   Chloride 89 (L) 98 - 111 mmol/L   CO2 20 (L) 22 - 32 mmol/L   Glucose, Bld 347 (H) 70 - 99 mg/dL   BUN 134 (H) 8 - 23 mg/dL   Creatinine, Ser 2.87 (H) 0.61 - 1.24 mg/dL   Calcium 8.5 (L) 8.9 - 10.3 mg/dL   Total Protein 5.9 (L) 6.5 - 8.1 g/dL   Albumin 3.1 (L) 3.5 - 5.0 g/dL   AST 16 15 - 41 U/L   ALT 13 0 - 44 U/L   Alkaline Phosphatase 66 38 - 126 U/L   Total Bilirubin 0.5 0.3 - 1.2 mg/dL   GFR, Estimated 22 (L) >60 mL/min   Anion gap 20 (H) 5 - 15  I-stat chem 8, ED  Result Value Ref Range   Sodium 126 (L) 135 - 145  mmol/L   Potassium 3.7 3.5 - 5.1 mmol/L   Chloride 89 (L) 98 - 111 mmol/L   BUN >  130 (H) 8 - 23 mg/dL   Creatinine, Ser 2.80 (H) 0.61 - 1.24 mg/dL   Glucose, Bld 356 (H) 70 - 99 mg/dL   Calcium, Ion 0.98 (L) 1.15 - 1.40 mmol/L   TCO2 23 22 - 32 mmol/L   Hemoglobin 11.6 (L) 13.0 - 17.0 g/dL   HCT 34.0 (L) 39.0 - 52.0 %  POC occult blood, ED Provider will collect  Result Value Ref Range   Fecal Occult Bld POSITIVE (A) NEGATIVE  Type and screen  Result Value Ref Range   ABO/RH(D) PENDING    Antibody Screen PENDING    Sample Expiration      08/21/2022,2359 Performed at [facility] [address], [place], [place] [address]     EKG EKG Interpretation  Date/Time:  Wednesday August 18 2022 14:21:27 EDT Ventricular Rate:  100 PR Interval:  151 QRS Duration: 83 QT Interval:  337 QTC Calculation: 435 R Axis:   73 Text Interpretation: Sinus tachycardia Non-specific ST-t changes Confirmed by Lajean Saver [phone]) on 08/18/2022 2:41:35 PM  Radiology CT Abdomen Pelvis Wo Contrast  Result Date: 08/18/2022 CLINICAL DATA:  Abdominal pain, acute, nonlocalized gi bleeding EXAM: CT ABDOMEN AND PELVIS WITHOUT CONTRAST TECHNIQUE: Multidetector CT imaging of the abdomen and pelvis was performed following the standard protocol without IV contrast. RADIATION DOSE REDUCTION: This exam was performed according to the departmental dose-optimization program which includes automated exposure control, adjustment of the mA and/or kV according to patient size and/or use of iterative reconstruction technique. COMPARISON:  05/28/2015 FINDINGS: Lower chest: Fibrotic changes within the bilateral lung bases with bronchiectasis, progressed from prior. Small superimposed opacity at the right lung base posteriorly may represent atelectasis or consolidation. Heart size within normal limits. Abnormal appearance of the distal esophagus which is circumferentially thickened and fluid-filled (series 3, image 11).  Extensive aortic and coronary artery atherosclerosis. Prominent bilateral gynecomastia. Hepatobiliary: No focal liver abnormality is seen. Status post cholecystectomy. No biliary dilatation. Pancreas: Unremarkable. No pancreatic ductal dilatation or surrounding inflammatory changes. Spleen: Normal in size without focal abnormality. Adrenals/Urinary Tract: Unremarkable adrenal glands. 4.5 cm right renal cyst, which requires no follow-up. 6 mm nonobstructing stone within the lower pole of the left kidney. No hydronephrosis. Urinary bladder is incompletely distended. Stomach/Bowel: Stomach is fluid distended but appears otherwise unremarkable. Appendix appears normal. Scattered colonic diverticulosis. No evidence of bowel wall thickening, distention, or inflammatory changes. Vascular/Lymphatic: Interval aortobifemoral stent grafting, suboptimally assessed in the absence of contrast. Advanced atherosclerosis of the native aorta. No abdominopelvic lymphadenopathy. Reproductive: Prostate is unremarkable. Other: No free fluid. No abdominopelvic fluid collection. No pneumoperitoneum. No abdominal wall hernia. Musculoskeletal: Chronic posttraumatic deformities of the left hemipelvis. Prior left femoral ORIF. No acute bony findings. IMPRESSION: 1. Abnormal appearance of the distal esophagus which is circumferentially thickened and fluid-filled. Findings are suggestive of esophagitis. Further evaluation with upper endoscopy is recommended. 2. Fibrotic changes within the bilateral lung bases with bronchiectasis, progressed from prior study. Small superimposed opacity at the right lung base posteriorly may represent atelectasis or pneumonia. 3. Nonobstructing left nephrolithiasis. 4. Colonic diverticulosis without evidence for acute diverticulitis. 5. Interval aortobifemoral stent grafting, suboptimally assessed in the absence of contrast. 6. Aortic and coronary artery atherosclerosis (ICD10-I70.0). Electronically Signed   By:  Davina Poke D.O.   On: 08/18/2022 16:17    Procedures Procedures    Medications Ordered in ED Medications  pantoprazole (PROTONIX) 80 mg /NS 100 mL IVPB (has no administration in time range)  pantoprozole (PROTONIX) 80 mg /NS  100 mL infusion (has no administration in time range)  pantoprazole (PROTONIX) injection 40 mg (has no administration in time range)  ondansetron (ZOFRAN) injection 4 mg (has no administration in time range)  sodium chloride 0.9 % bolus 1,000 mL (has no administration in time range)    ED Course/ Medical Decision Making/ A&P                           Medical Decision Making Problems Addressed: Acute GI bleeding: acute illness or injury with systemic symptoms that poses a threat to life or bodily functions AKI (acute kidney injury) ([place]): acute illness or injury with systemic symptoms that poses a threat to life or bodily functions Generalized weakness: acute illness or injury with systemic symptoms that poses a threat to life or bodily functions Hyperglycemia: acute illness or injury Leukocytosis, unspecified type: acute illness or injury Nausea and vomiting in adult: acute illness or injury with systemic symptoms Uremia: acute illness or injury with systemic symptoms that poses a threat to life or bodily functions  Amount and/or Complexity of Data Reviewed Independent Historian: EMS    Details: hx External Data Reviewed: notes. Labs: ordered. Decision-making details documented in ED Course. Radiology: ordered and independent interpretation performed. Decision-making details documented in ED Course. ECG/medicine tests: ordered and independent interpretation performed. Decision-making details documented in ED Course. Discussion of management or test interpretation with external provider(s): Gi and medicine physicians.  Risk Prescription drug management. Decision regarding hospitalization.  Iv ns. Continuous pulse ox and cardiac monitoring. Labs  ordered/sent. Imaging ordered.   Diff dx includes gi bleeding, acute gi illness, pud, gastritis, etc - dispo decision including potential need for admission considered - will get labs, plan for admission.   Reviewed nursing notes and prior charts for additional history. External reports reviewed. Additional history from: EMS.   Cardiac monitor: sinus rhythm, rate 96.  Labs reviewed/interpreted by me - glucose high, hco3 normal. Aki. Hgb mildly low.   Protonix bolus and gtt. Ns bolus.   Gi consulted - discussed pt, hx and labs - will see in ED.   Unassigned medicine consulted for admission.  Discussed w IM ROC  - discussed pt, labs, hx, gi to see, and ct pending - will admit.   Prbc ordered.   CT reviewed/interpreted by me - no sbo, no perf.   Pt requests pain med, dilaudid .5 mg iv. Zofran iv.   CRITICAL CARE  RE : acute gi bleeding with weakness, near syncope, requiring emergent prbc transfusion Performed by: Mirna Mires Total critical care time: 115 minutes Critical care time was exclusive of separately billable procedures and treating other patients. Critical care was necessary to treat or prevent imminent or life-threatening deterioration. Critical care was time spent personally by me on the following activities: development of treatment plan with patient and/or surrogate as well as nursing, discussions with consultants, evaluation of patient's response to treatment, examination of patient, obtaining history from patient or surrogate, ordering and performing treatments and interventions, ordering and review of laboratory studies, ordering and review of radiographic studies, pulse oximetry and re-evaluation of patient's condition.  Possible pna on ct. Rocephin and zithromax iv.           Final Clinical Impression(s) / ED Diagnoses Final diagnoses:  None    Rx / DC Orders ED Discharge Orders     None        Lajean Saver, MD 08/18/22 1624

## 2022-08-18 NOTE — H&P (Cosign Needed)
Date: 08/18/2022               Patient Name:  [name] MRN: [id number]  DOB: 1946/11/30 Age / Sex: 76 y.o., male   PCP: Elaina Pattee, MD         Medical Service: Internal Medicine Teaching Service         Attending Physician: Dr. 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 He states this has "probably happened before." He has not had a meal in a week, and has not had a bowel movement in that time as well. He states he has been feeling dizzy and fell twice today. His urinary frequency has also decreased. On exam, pt is clearly distressed, and has a hard time vocalizing due to abdominal pain. Pain is located in the epigastric region and does not radiate. He described the pain as dull, and there are no exacerbating or alleviating factors. Denies any over the counter medicine. Denies fevers, chills, shortness of breath, back pain. He has a history of taking eliquis and plavix, and last dose was three weeks ago. He has ecchymosis on both UE and LE, which could be a result of the two falls he had today.   ED Course: Pt was evaluated in the ED and thought to be actively bleeding, so one unit of RBCs was given. GI was consulted for recommendations, and IMTS was consulted for admission.   Meds:  Lipitor '40mg'$  Ferrous Sulfate  Levothyroxine  45mg Metformin '500mg'$  Methotrexate injections  Zofran '4mg'$  as needed Protonix '40mg'$  Prednisone 7.5 mg daily  Carafate '100mg'$ /mL  Past Medical History  Past Surgical History:  Procedure Laterality Date   aneursym repair     aneursym repair and stent placement  05/2017   CARPAL TUNNEL RELEASE Bilateral    COLON SURGERY  2007   removed 18" of colon   ERCP W/ METAL STENT PLACEMENT  11/2005   /Archie Endo12/19/2006   ESOPHAGOGASTRODUODENOSCOPY (EGD) WITH ESOPHAGEAL DILATION     "several times'   HAND SURGERY Bilateral    "plastic knuckles"   LAPAROSCOPIC CHOLECYSTECTOMY  01/2006   MELANOMA EXCISION     "back; left arm"   NASAL SEPTUM SURGERY     PELVIC FRACTURE SURGERY  1964   "busted in 2 places"   RESECTION DISTAL CLAVICAL Right 05/13/2016   Procedure: DISTAL CLAVICLE EXCISION;  Surgeon: DNinetta Lights MD;  Location[facility]  Service: Orthopedics;  Laterality: Right;   SHOULDER ARTHROSCOPY WITH ROTATOR CUFF REPAIR AND SUBACROMIAL DECOMPRESSION Right 05/13/2016   Procedure: RIGHT SHOULDER SCOPE DEBRIDEMENT, ACROMIOPLASTY, ROTATOR CUFF REPAIR; RELEASE BICEPS TENDON AND DEBRIDEMENT LABRUM;  Surgeon: DNinetta Lights MD;  Location: [facility]  Service: Orthopedics;  Laterality: Right;   TIBIA FRACTURE SURGERY Left 2006   "  put a steel rod in it"    Social:  Lives With: By himself in apartment in [place] Occupation: Level of Function: Able to perform all ADLs/IADLs independently PCP: Dr. Hulan Saas, [place]  Substances: Denies any tobacco, alcohol, illicit drug use  Family History:  States father passed away at 62   Allergies: Allergies as of 08/18/2022   (No Known Allergies)    Review of Systems: A complete ROS was negative except as per HPI.   OBJECTIVE:   Physical Exam: Blood pressure 126/84, pulse 98, temperature (!) 97 F (36.1 C), temperature source Temporal, resp. rate (!) 27, SpO2 100 %.  Constitutional: elderly man  laying in bed, in distress HENT: normocephalic atraumatic, mucous membranes moist Eyes: conjunctiva non-erythematous Neck: supple, no prominent lymph nodes felt Cardiovascular: regular rate and rhythm, no m/r/g, distal pulses +1 Pulmonary/Chest: normal work of breathing on room air, lungs clear to auscultation bilaterally Abdominal: Tenderness in epigastric region MSK: normal bulk and tone Neurological: alert & oriented x 3, 5/5 strength in bilateral upper and lower extremities, normal gait Skin: Ecchymoses present on both upper extremities and lower extremities  Labs: CBC    Component Value Date/Time   WBC 21.3 (H) 08/18/2022 1418   RBC 3.22 (L) 08/18/2022 1418   HGB 11.6 (L) 08/18/2022 1508   HGB 13.7 11/20/2014 1140   HCT 34.0 (L) 08/18/2022 1508   HCT 41.8 11/20/2014 1140   PLT 274 08/18/2022 1418   PLT 172 11/20/2014 1140   MCV 91.3 08/18/2022 1418   MCV 100 11/20/2014 1140   MCH 32.6 08/18/2022 1418   MCHC 35.7 08/18/2022 1418   RDW 14.2 08/18/2022 1418   RDW 15.0 (H) 11/20/2014 1140   LYMPHSABS 0.4 (L) 02/05/2021 0059   LYMPHSABS 1.0 11/20/2014 1140   MONOABS 0.6 02/05/2021 0059   MONOABS 0.5 11/20/2014 1140   EOSABS 0.1 02/05/2021 0059   EOSABS 0.0 11/20/2014 1140   BASOSABS 0.0 02/05/2021 0059   BASOSABS 0.1 11/20/2014 1140     CMP     Component Value Date/Time   NA 126 (L) 08/18/2022 1508   NA 139 11/20/2014 1140   K 3.7 08/18/2022 1508   K 4.6 11/20/2014 1140   CL 89 (L) 08/18/2022 1508   CL 102 11/20/2014 1140   CO2 20 (L) 08/18/2022 1418   CO2 29 11/20/2014 1140   GLUCOSE 356 (H) 08/18/2022 1508   GLUCOSE 144 (H) 11/20/2014 1140   BUN >130 (H) 08/18/2022 1508   BUN 14 11/20/2014 1140   CREATININE 2.80 (H) 08/18/2022 1508   CREATININE 1.29 11/20/2014 1140   CALCIUM 8.5 (L) 08/18/2022 1418   CALCIUM 9.4 11/20/2014 1140   PROT 5.9 (L) 08/18/2022 1418   ALBUMIN 3.1 (L) 08/18/2022 1418   AST 16 08/18/2022 1418   AST 28 11/20/2014 1140   ALT 13  08/18/2022 1418   ALT 31 11/20/2014 1140   ALKPHOS 66 08/18/2022 1418   BILITOT 0.5 08/18/2022 1418   GFRNONAA 22 (L) 08/18/2022 1418   GFRNONAA 59 (L) 11/20/2014 1140   GFRNONAA >60 07/04/2013 1231   GFRAA >60 05/07/2016 0840   GFRAA >60 11/20/2014 1140   GFRAA >60 07/04/2013 1231    ASSESSMENT & PLAN:   Assessment & Plan by Problem: Active Problems:   * No active hospital problems. *   [name] is a 76 y.o. person living with a history of Type 2 DM, HTN, erosive esophagitis, esophageal stricture requiring dilations who presented with severe epigastric pain and  falls and admitted for acute GI bleed on hospital day 0  #Acute GI Bleeding Secondary to Unknown Causes #Anemia due to blood loss  Pt has a week history of dark emesis and stools. He has a history of erosive esophagitis and recurrent esophageal strictures that have required multiple EGDs with dilatations. 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 His last Endoscopy was in May of this year, and last colonoscopy was 09/01/21. Considering upper and lower GI bleed at this time due to bloody emesis and bowel movements. Potential Ulcer or esophageal bleed as pt has hx of both. GI is on board and has recommended IV Pantoprazole, and EGD planned for tomorrow morning. FOBT is positive. BUN is >130 Pt's hemoglobin level upon admission was 10.5, and was administered 1 unit of RBC due to supposed active bleeding. His Hb after administration was 11.6.   Plan:  - Appreciate GI recommendations  - Hold eliquis and plavix  - IV PPI  - EGD planned for tomorrow morning  - Continue monitoring H+H - Assess need for more Rbc after EGD is performed   #AKI Creatinine is 2.8 today on admission, and estimated GFR is 22. Unsure what patients baseline is at this  time. Could be due to dehydration because of emesis and GI bleed.   Plan:  - Normal saline for hydration  #T2DM Pt glucose on BMP was elevated at 356. Per last PCP note, the only medication he was taking for his diabetes was metformin 500 BID.   Plan:  - Started Semglee 10U  Code: Full    Dispo: Admit patient for more than two midnights.   Signed: Drucie Opitz, MD Internal Medicine Resident PGY-1  08/18/2022, 3:53 PM

## 2022-08-18 NOTE — Hospital Course (Addendum)
#Acute GI Bleeding Secondary to Unknown Causes, Resolved  #LA Grade D Reflux Esophagitis  #Duodenal Ulcer Disease  Pt has a week history of dark emesis and stools. He has a history of erosive esophagitis and recurrent esophageal strictures that have required multiple EGDs with dilatations. 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 His last Endoscopy was in May of this year, and last colonoscopy was 09/01/21.  Initially, there was confusion on if patient was on Plavix versus Eliquis versus Lovenox.  It was then figured out that the patient stopped Plavix and Eliquis, and was only on Lovenox prior to admission.  GI was brought on board and recommended IV Pantoprazole. EGD showed LA Grade D Reflux Esophagitis with no bleeding, a small hiatal hernia, normal stomach, non-bleeding duodenal ulcers. Preliminary biopsy results show inflammation and necrotic debris with a small focus of atypical cells. Pt's hemoglobin level upon admission was 10.5, and was administered 1 unit of RBC due to supposed active bleeding. His Hb after administration was 11.6. During admission, patient's hemoglobin dropped to 7.1 on 08/21/2022.  He was given another unit of blood which brought his hemoglobin up to 8.1 on 08/22/2022.  We continued his IV PPI, and added carafate to his regimen.  Maalox, lidocaine, famotidine also added for pain relief.  Pt's primary GI doctor is Dr. Leroy Sea at [facility], recommend outpatient follow up with him, and follow up EGD in 8 weeks with his doctor.   Due to severity and intermittent natures of patient's condition, palliative care consult was placed and he states that he would like full school treatment right now.  Patient will be sent to SNF for further physical therapy and palliative care will follow outpatient.   Patient will be sent out on oral  pantoprazole 40 mg twice daily, Carafate 3 times daily, famotidine, Maalox, and lidocaine for symptomatic control until patient can follow-up with GI outpatient.  #Non-Displaced Left Weber B Ankle Fracture (Distal Fibula) On hospital course day 2, patient was complaining of L ankle pain. On examination, L ankle was very tender to palpation. X-Ray was ordered which revealed oblique fracture of distal fibula, which could have happened because of patient falls that led to his admission. Orthopedics was consulted and placed him in a CAM boot, and recommended conservative management for now, and weight-bearing films to rule out surgery. He was given Oxycodone '5mg'$  for pain.  Patient will be discharged to follow-up with orthopedics outpatient for weightbearing scans. Patient will be discharged to [facility] for rehab.  #Rheumatoid Arthritis Patient has a history of rheumatoid arthritis, and had been taking 5 mg prednisone prior to admission.  Upon further chart review during admission showing that [facility] rheumatology was tapering off his prednisone.  Patient was initially started on 7.5 mg prednisone during admission, and transition to 5 mg after seeing this rheumatology note.  Patient will be discharged on 5 mg prednisone to be tapering off.  Goal will be to get to 4 mg for 1 month, 3 mg for 1 month, 2 mg for 1 month, and 1 mg for another month.  #Electrolyte derangements Patient initially had low potassium on admission of 3.4.  Patient also had decreased magnesium of 1.4.  Patient also was found to have decreased phosphorus of 2.1.  Potassium, magnesium, and phosphorus was supplemented in hospital.  #Type II  Diabetes Mellitus At home, patient was taking metformin '500mg'$  BID, and upon admission his sugar level was 356.  A1c on 08/20/2022 was 6.8. Initially, we decided to start him on Semglee 5U. However, due to poor oral intake, we decided against it and started a sliding scale, which helped get his glucose to  goal.  Upon discharge, patient will be continued on metformin 500 mg twice daily and further management will be outpatient.

## 2022-08-18 NOTE — Consult Note (Addendum)
Consultation Note   Referring Provider:  Internal Medicine Teaching Service PCP: Elaina Pattee, MD Primary Gastroenterologist: Hilbert Corrigan, MD  [facility] Reason for consultation: GI bleed  Hospital Day: 1  Assessment / Plan   # 76 yo male with epigastric pain and upper GI bleed (hematemesis, recent black stools) on Plavix and Eliquis. FOBT + black stool on EDP's exam. His BUN is > 130. Rule out PUD.  He needs an EGD, will plan for tomorrow.  The risks and benefits of EGD with possible biopsies were discussed with the patient who agrees to proceed.  PPI infusion Monitor H+H. Receiving a unit of PRBC's now.  His last doses of Plavix, Eliquis were apparently a couple of weeks ago (hasn't tolerated PO)  #  See PMH for additional medical problems   Attending Physician Note   I have taken a history, reviewed the chart and examined the patient. I performed more than 50% of this encounter in conjunction with the APP. I agree with the APP's note, impression and recommendations with my edits. My additional impressions and recommendations are as follows.   *UGI bleed with melena, N/V, coffee ground emesis, epigastric pain. CT today shows a thickened distal esophagus, fluid-filled. History of LA Grade B erosive esophagitis and recurrent esophageal strictures requiring multiple EGDs with dilations, followed at [facility]. Pantoprazole IV, NPO and EGD planned for tomorrow morning.   *ABL anemia. Trend CBC.   *Chronic DVTs, hold Eliquis and Plavix   *Hyponatremia, hypocalcemia - per primary service   *AKI, dehydration - IV volume resuscitation   *Hyperglycemia - per primary service   *Leukocytosis - per primary service  [name], MD [facility] See AM[facility], [facility], for our on call provider     HPI   [name] is a 76 y.o. male with a past medical history significant for RA, chronic DVT, hiatal hernia, erosive esophagitis /  esophageal stricture requiring multiple dilations, diverticulosis, DM.  See PMH for any additional medical problems.  Patient has been followed long term by [facility] for esophageal strictures requiring dilation.   He was brought by EMS to ED today for evaluation of tarry stools and dizziness.  Patient is a limited historian. He tells me that he has had nausea / vomiting for two weeks along with sharp epigastric pain. He doesn't know if the pain is worse with eating because he hasn't been eating. He hasn't had any recent bowel movements but his stools have been black for unknown length of time. He hasn't had Plavix in two weeks due the vomiting. He has been dizzy and overall not feeling well for the last two weeks. He doesn't really describe significant dysphagia but does regurgitate what he eats.   ED evaluation:  Mild tachycardia WBC 21.3 Hgb 10.5 BUN > 130, Cr 2.8. GFR 22 EDP exam + Black, heme positive stool Non-contrast CT AP >> fluid-filled thickened distal esophagus suggesting esophagitis.   His Hgb has actually come up to 11.6 without transfusion and he is now getting a unit of blood    Previous GI Evaluation     Multiple EGDs at [facility] for treatment of esophageal strictures   Recent Labs and Imaging CT Abdomen Pelvis Wo Contrast  Result  Date: 08/18/2022 CLINICAL DATA:  Abdominal pain, acute, nonlocalized gi bleeding EXAM: CT ABDOMEN AND PELVIS WITHOUT CONTRAST TECHNIQUE: Multidetector CT imaging of the abdomen and pelvis was performed following the standard protocol without IV contrast. RADIATION DOSE REDUCTION: This exam was performed according to the departmental dose-optimization program which includes automated exposure control, adjustment of the mA and/or kV according to patient size and/or use of iterative reconstruction technique. COMPARISON:  05/28/2015 FINDINGS: Lower chest: Fibrotic changes within the bilateral lung bases with bronchiectasis, progressed from prior. Small  superimposed opacity at the right lung base posteriorly may represent atelectasis or consolidation. Heart size within normal limits. Abnormal appearance of the distal esophagus which is circumferentially thickened and fluid-filled (series 3, image 11). Extensive aortic and coronary artery atherosclerosis. Prominent bilateral gynecomastia. Hepatobiliary: No focal liver abnormality is seen. Status post cholecystectomy. No biliary dilatation. Pancreas: Unremarkable. No pancreatic ductal dilatation or surrounding inflammatory changes. Spleen: Normal in size without focal abnormality. Adrenals/Urinary Tract: Unremarkable adrenal glands. 4.5 cm right renal cyst, which requires no follow-up. 6 mm nonobstructing stone within the lower pole of the left kidney. No hydronephrosis. Urinary bladder is incompletely distended. Stomach/Bowel: Stomach is fluid distended but appears otherwise unremarkable. Appendix appears normal. Scattered colonic diverticulosis. No evidence of bowel wall thickening, distention, or inflammatory changes. Vascular/Lymphatic: Interval aortobifemoral stent grafting, suboptimally assessed in the absence of contrast. Advanced atherosclerosis of the native aorta. No abdominopelvic lymphadenopathy. Reproductive: Prostate is unremarkable. Other: No free fluid. No abdominopelvic fluid collection. No pneumoperitoneum. No abdominal wall hernia. Musculoskeletal: Chronic posttraumatic deformities of the left hemipelvis. Prior left femoral ORIF. No acute bony findings. IMPRESSION: 1. Abnormal appearance of the distal esophagus which is circumferentially thickened and fluid-filled. Findings are suggestive of esophagitis. Further evaluation with upper endoscopy is recommended. 2. Fibrotic changes within the bilateral lung bases with bronchiectasis, progressed from prior study. Small superimposed opacity at the right lung base posteriorly may represent atelectasis or pneumonia. 3. Nonobstructing left nephrolithiasis.  4. Colonic diverticulosis without evidence for acute diverticulitis. 5. Interval aortobifemoral stent grafting, suboptimally assessed in the absence of contrast. 6. Aortic and coronary artery atherosclerosis (ICD10-I70.0). Electronically Signed   By: Davina Poke D.O.   On: 08/18/2022 16:17    Labs:  Recent Labs    08/18/22 1418 08/18/22 1508  WBC 21.3*  --   HGB 10.5* 11.6*  HCT 29.4* 34.0*  PLT 274  --    Recent Labs    08/18/22 1418 08/18/22 1508  NA 129* 126*  K 3.4* 3.7  CL 89* 89*  CO2 20*  --   GLUCOSE 347* 356*  BUN 134* >130*  CREATININE 2.87* 2.80*  CALCIUM 8.5*  --    Recent Labs    08/18/22 1418  PROT 5.9*  ALBUMIN 3.1*  AST 16  ALT 13  ALKPHOS 57  BILITOT 0.5    Past Medical History:  Diagnosis Date   Aortic aneurysm ([place]) 05/2017   Chronic back pain    Diabetes mellitus without complication (HCC)    Dyslipidemia    GERD (gastroesophageal reflux disease)    H/O hiatal hernia    Hypertension    Melanoma ([place])    "back; left arm"   PONV (postoperative nausea and vomiting)    Rheumatoid arthritis ([place])    Spasm of esophagus     Past Surgical History:  Procedure Laterality Date   aneursym repair     aneursym repair and stent placement  05/2017   CARPAL TUNNEL RELEASE Bilateral    COLON SURGERY  2007   removed 18" of colon   ERCP W/ METAL STENT PLACEMENT  11/2005   Archie Endo 12/14/2005   ESOPHAGOGASTRODUODENOSCOPY (EGD) WITH ESOPHAGEAL DILATION     "several times'   HAND SURGERY Bilateral    "plastic knuckles"   LAPAROSCOPIC CHOLECYSTECTOMY  01/2006   MELANOMA EXCISION     "back; left arm"   NASAL SEPTUM [place]   "busted in 2 places"   RESECTION DISTAL CLAVICAL Right 05/13/2016   Procedure: DISTAL CLAVICLE EXCISION;  Surgeon: Ninetta Lights, MD;  Location: [place];  Service: Orthopedics;  Laterality: Right;   SHOULDER ARTHROSCOPY WITH ROTATOR CUFF REPAIR AND SUBACROMIAL DECOMPRESSION  Right 05/13/2016   Procedure: RIGHT SHOULDER SCOPE DEBRIDEMENT, ACROMIOPLASTY, ROTATOR CUFF REPAIR; RELEASE BICEPS TENDON AND DEBRIDEMENT LABRUM;  Surgeon: Ninetta Lights, MD;  Location: [place];  Service: Orthopedics;  Laterality: Right;   TIBIA FRACTURE SURGERY Left 2006   "put a steel rod in it"    Family History  Problem Relation Age of Onset   Heart disease Mother    Heart disease Father    Cancer Maternal Grandmother     Prior to Admission medications   Medication Sig Start Date End Date Taking? Authorizing Provider  abatacept (ORENCIA) 250 MG injection Inject 250 mg into the vein every 30 (thirty) days. 02/12/21   Sanjuan Dame, MD  atorvastatin (LIPITOR) 20 MG tablet Take 20 mg by mouth daily.     [provider]  atorvastatin (LIPITOR) 40 MG tablet Take 40 mg by mouth daily. 06/25/22   [provider]  clopidogrel (PLAVIX) 75 MG tablet Take 1 tablet by mouth daily.  01/31/20   [provider]  ELIQUIS 5 MG TABS tablet Take 5 mg by mouth 2 (two) times daily. 12/31/20   [provider]  enoxaparin (LOVENOX) 40 MG/0.4ML injection Inject into the skin. 08/06/22   [provider]  famotidine (PEPCID) 40 MG/5ML suspension Take by mouth. 07/23/22   [provider]  ferrous sulfate 325 (65 FE) MG tablet Take 325 mg by mouth daily. 10/25/20   [provider]  folic acid (FOLVITE) 1 MG tablet Take 1 mg by mouth daily. 07/31/22   [provider]  irbesartan (AVAPRO) 150 MG tablet Take 150 mg by mouth daily.  07/26/19   [provider]  levothyroxine (SYNTHROID) 50 MCG tablet Take 50 mcg by mouth daily. 05/29/22   [provider]  metFORMIN (GLUCOPHAGE) 1000 MG tablet Take 1,000 mg by mouth daily. 05/28/22   [provider]  metFORMIN (GLUCOPHAGE) 500 MG tablet Take 500 mg by mouth daily with breakfast.     [provider]  OFEV 100 MG CAPS Take 100 mg by mouth in the morning  and at bedtime. 01/19/21   [provider]  omeprazole (PRILOSEC) 20 MG capsule Take 20 mg by mouth daily.  07/15/15   [provider]  pantoprazole (PROTONIX) 40 MG tablet Take 40 mg by mouth daily. 07/31/22   [provider]  potassium chloride (K-DUR) 10 MEQ tablet Take 10 mEq by mouth daily.  01/06/16   [provider]  predniSONE (DELTASONE) 5 MG tablet Take 1 tablet (5 mg total) by mouth daily with breakfast. Take 1 tablet ('5mg'$ ) daily starting on 02/10/21. 02/10/21   Sanjuan Dame, MD    Current Facility-Administered Medications  Medication Dose Route Frequency Provider Last Rate Last Admin   0.9 %  sodium  chloride infusion (Manually program via Guardrails IV Fluids)   Intravenous Once Lajean Saver, MD       Derrill Memo ON 08/22/2022] pantoprazole (PROTONIX) injection 40 mg  40 mg Intravenous Q12H Lajean Saver, MD       pantoprozole (PROTONIX) 80 mg /NS 100 mL infusion  8 mg/hr Intravenous Continuous Lajean Saver, MD 10 mL/hr at 08/18/22 1527 8 mg/hr at 08/18/22 1527   Current Outpatient Medications  Medication Sig Dispense Refill   abatacept (ORENCIA) 250 MG injection Inject 250 mg into the vein every 30 (thirty) days. 1 each 12   atorvastatin (LIPITOR) 20 MG tablet Take 20 mg by mouth daily.      atorvastatin (LIPITOR) 40 MG tablet Take 40 mg by mouth daily.     clopidogrel (PLAVIX) 75 MG tablet Take 1 tablet by mouth daily.      ELIQUIS 5 MG TABS tablet Take 5 mg by mouth 2 (two) times daily.     enoxaparin (LOVENOX) 40 MG/0.4ML injection Inject into the skin.     famotidine (PEPCID) 40 MG/5ML suspension Take by mouth.     ferrous sulfate 325 (65 FE) MG tablet Take 325 mg by mouth daily.     folic acid (FOLVITE) 1 MG tablet Take 1 mg by mouth daily.     irbesartan (AVAPRO) 150 MG tablet Take 150 mg by mouth daily.      levothyroxine (SYNTHROID) 50 MCG tablet Take 50 mcg by mouth daily.     metFORMIN (GLUCOPHAGE) 1000 MG tablet Take 1,000 mg by mouth  daily.     metFORMIN (GLUCOPHAGE) 500 MG tablet Take 500 mg by mouth daily with breakfast.      OFEV 100 MG CAPS Take 100 mg by mouth in the morning and at bedtime.     omeprazole (PRILOSEC) 20 MG capsule Take 20 mg by mouth daily.      pantoprazole (PROTONIX) 40 MG tablet Take 40 mg by mouth daily.     potassium chloride (K-DUR) 10 MEQ tablet Take 10 mEq by mouth daily.      predniSONE (DELTASONE) 5 MG tablet Take 1 tablet (5 mg total) by mouth daily with breakfast. Take 1 tablet ('5mg'$ ) daily starting on 02/10/21. 30 tablet 0    Allergies as of 08/18/2022   (No Known Allergies)    Social History   Socioeconomic History   Marital status: Divorced    Spouse name: Not on file   Number of children: Not on file   Years of education: Not on file   Highest education level: Not on file  Occupational History   Not on file  Tobacco Use   Smoking status: Former    Packs/day: 1.00    Years: 46.00    Total pack years: 46.00    Types: Cigarettes    Quit date: 11/26/2005    Years since quitting: 16.7   Smokeless tobacco: Never  Substance and Sexual Activity   Alcohol use: Yes    Alcohol/week: 9.0 standard drinks of alcohol    Types: 9 Shots of liquor per week    Comment: 05/23/2014 "3, 1 shot drinks maybe 3 times/wk"   Drug use: No   Sexual activity: Not Currently  Other Topics Concern   Not on file  Social History Narrative   Not on file   Social Determinants of Health   Financial Resource Strain: Not on file  Food Insecurity: Not on file  Transportation Needs: Not on file  Physical Activity: Not on file  Stress: Not on file  Social Connections: Not on file  Intimate Partner Violence: Not on file    Review of Systems: All systems reviewed and negative except where noted in HPI.  Physical Exam: Vital signs in last 24 hours: Temp:  [97 F (36.1 C)] 97 F (36.1 C) (08/23 1422) Pulse Rate:  [92-103] 92 (08/23 1600) Resp:  [16-28] 20 (08/23 1600) BP: (119-149)/(63-84)  149/71 (08/23 1600) SpO2:  [96 %-100 %] 100 % (08/23 1600)   General:  Alert thin male in NAD. Dark dried blood around his lips.  Psych:  Pleasant, cooperative. Normal mood and affect Eyes: Pupils equal, no icterus. Conjunctive pink Ears:  Normal auditory acuity Nose: No deformity, discharge or lesions Neck:  Supple, no masses felt Lungs:  Clear to auscultation.  Heart:  Regular rate, regular rhythm. Abdomen:  Soft, nondistended, nontender, active bowel sounds, no masses felt Rectal :  Deferred Msk: Symmetrical without gross deformities.  Neurologic:  Alert, oriented, grossly normal neurologically Skin:  Intact without significant lesions.   Intake/Output from previous day: No intake/output data recorded. Intake/Output this shift:  No intake/output data recorded.    Active Problems:   * No active hospital problems. Tye Savoy, NP-C @  08/18/2022, 4:07 PM

## 2022-08-19 ENCOUNTER — Observation Stay (HOSPITAL_COMMUNITY): Payer: Medicare Other | Admitting: Certified Registered Nurse Anesthetist

## 2022-08-19 ENCOUNTER — Observation Stay (HOSPITAL_COMMUNITY): Payer: Medicare Other

## 2022-08-19 ENCOUNTER — Encounter (HOSPITAL_COMMUNITY)
Admission: EM | Disposition: A | Discharge status: Skilled Nursing Facility | Payer: Self-pay | Source: Home / Self Care | Attending: Internal Medicine

## 2022-08-19 ENCOUNTER — Observation Stay (HOSPITAL_BASED_OUTPATIENT_CLINIC_OR_DEPARTMENT_OTHER): Payer: Medicare Other | Admitting: Certified Registered Nurse Anesthetist

## 2022-08-19 ENCOUNTER — Encounter (HOSPITAL_COMMUNITY): Payer: Self-pay | Admitting: Internal Medicine

## 2022-08-19 DIAGNOSIS — E876 Hypokalemia: Secondary | ICD-10-CM | POA: Diagnosis present

## 2022-08-19 DIAGNOSIS — K2211 Ulcer of esophagus with bleeding: Secondary | ICD-10-CM | POA: Diagnosis present

## 2022-08-19 DIAGNOSIS — Z7952 Long term (current) use of systemic steroids: Secondary | ICD-10-CM | POA: Diagnosis not present

## 2022-08-19 DIAGNOSIS — K269 Duodenal ulcer, unspecified as acute or chronic, without hemorrhage or perforation: Secondary | ICD-10-CM

## 2022-08-19 DIAGNOSIS — R531 Weakness: Secondary | ICD-10-CM | POA: Diagnosis not present

## 2022-08-19 DIAGNOSIS — E119 Type 2 diabetes mellitus without complications: Secondary | ICD-10-CM | POA: Diagnosis not present

## 2022-08-19 DIAGNOSIS — E871 Hypo-osmolality and hyponatremia: Secondary | ICD-10-CM | POA: Diagnosis present

## 2022-08-19 DIAGNOSIS — K449 Diaphragmatic hernia without obstruction or gangrene: Secondary | ICD-10-CM

## 2022-08-19 DIAGNOSIS — K922 Gastrointestinal hemorrhage, unspecified: Secondary | ICD-10-CM | POA: Diagnosis not present

## 2022-08-19 DIAGNOSIS — S8265XA Nondisplaced fracture of lateral malleolus of left fibula, initial encounter for closed fracture: Secondary | ICD-10-CM | POA: Diagnosis not present

## 2022-08-19 DIAGNOSIS — I10 Essential (primary) hypertension: Secondary | ICD-10-CM | POA: Diagnosis present

## 2022-08-19 DIAGNOSIS — Z515 Encounter for palliative care: Secondary | ICD-10-CM | POA: Diagnosis not present

## 2022-08-19 DIAGNOSIS — K209 Esophagitis, unspecified without bleeding: Secondary | ICD-10-CM | POA: Diagnosis not present

## 2022-08-19 DIAGNOSIS — N19 Unspecified kidney failure: Secondary | ICD-10-CM | POA: Diagnosis present

## 2022-08-19 DIAGNOSIS — N179 Acute kidney failure, unspecified: Secondary | ICD-10-CM | POA: Diagnosis present

## 2022-08-19 DIAGNOSIS — Z7901 Long term (current) use of anticoagulants: Secondary | ICD-10-CM | POA: Diagnosis not present

## 2022-08-19 DIAGNOSIS — K2100 Gastro-esophageal reflux disease with esophagitis, without bleeding: Secondary | ICD-10-CM | POA: Diagnosis not present

## 2022-08-19 DIAGNOSIS — D62 Acute posthemorrhagic anemia: Secondary | ICD-10-CM | POA: Diagnosis present

## 2022-08-19 DIAGNOSIS — K920 Hematemesis: Secondary | ICD-10-CM

## 2022-08-19 DIAGNOSIS — Z86718 Personal history of other venous thrombosis and embolism: Secondary | ICD-10-CM | POA: Diagnosis not present

## 2022-08-19 DIAGNOSIS — J449 Chronic obstructive pulmonary disease, unspecified: Secondary | ICD-10-CM

## 2022-08-19 DIAGNOSIS — E860 Dehydration: Secondary | ICD-10-CM | POA: Diagnosis present

## 2022-08-19 DIAGNOSIS — K264 Chronic or unspecified duodenal ulcer with hemorrhage: Secondary | ICD-10-CM | POA: Diagnosis present

## 2022-08-19 DIAGNOSIS — Z87891 Personal history of nicotine dependence: Secondary | ICD-10-CM

## 2022-08-19 DIAGNOSIS — K222 Esophageal obstruction: Secondary | ICD-10-CM | POA: Diagnosis not present

## 2022-08-19 DIAGNOSIS — K2101 Gastro-esophageal reflux disease with esophagitis, with bleeding: Secondary | ICD-10-CM

## 2022-08-19 DIAGNOSIS — Z85820 Personal history of malignant melanoma of skin: Secondary | ICD-10-CM | POA: Diagnosis not present

## 2022-08-19 DIAGNOSIS — Y92008 Other place in unspecified non-institutional (private) residence as the place of occurrence of the external cause: Secondary | ICD-10-CM | POA: Diagnosis not present

## 2022-08-19 DIAGNOSIS — M069 Rheumatoid arthritis, unspecified: Secondary | ICD-10-CM | POA: Diagnosis present

## 2022-08-19 DIAGNOSIS — D72829 Elevated white blood cell count, unspecified: Secondary | ICD-10-CM | POA: Diagnosis present

## 2022-08-19 DIAGNOSIS — W1830XA Fall on same level, unspecified, initial encounter: Secondary | ICD-10-CM | POA: Diagnosis present

## 2022-08-19 DIAGNOSIS — Z79899 Other long term (current) drug therapy: Secondary | ICD-10-CM | POA: Diagnosis not present

## 2022-08-19 DIAGNOSIS — Z7902 Long term (current) use of antithrombotics/antiplatelets: Secondary | ICD-10-CM | POA: Diagnosis not present

## 2022-08-19 DIAGNOSIS — R1013 Epigastric pain: Secondary | ICD-10-CM | POA: Diagnosis not present

## 2022-08-19 DIAGNOSIS — R350 Frequency of micturition: Secondary | ICD-10-CM | POA: Diagnosis present

## 2022-08-19 DIAGNOSIS — E1165 Type 2 diabetes mellitus with hyperglycemia: Secondary | ICD-10-CM | POA: Diagnosis present

## 2022-08-19 DIAGNOSIS — Z9049 Acquired absence of other specified parts of digestive tract: Secondary | ICD-10-CM | POA: Diagnosis not present

## 2022-08-19 DIAGNOSIS — Z7984 Long term (current) use of oral hypoglycemic drugs: Secondary | ICD-10-CM | POA: Diagnosis not present

## 2022-08-19 HISTORY — PX: ESOPHAGOGASTRODUODENOSCOPY (EGD) WITH PROPOFOL: SHX5813

## 2022-08-19 HISTORY — PX: BIOPSY: SHX5522

## 2022-08-19 LAB — BASIC METABOLIC PANEL
Anion gap: 13 (ref 5–15)
BUN: 116 mg/dL — ABNORMAL HIGH (ref 8–23)
CO2: 19 mmol/L — ABNORMAL LOW (ref 22–32)
Calcium: 7.7 mg/dL — ABNORMAL LOW (ref 8.9–10.3)
Chloride: 99 mmol/L (ref 98–111)
Creatinine, Ser: 2.25 mg/dL — ABNORMAL HIGH (ref 0.61–1.24)
GFR, Estimated: 29 mL/min — ABNORMAL LOW (ref >60)
Glucose, Bld: 216 mg/dL — ABNORMAL HIGH (ref 70–99)
Potassium: 3.6 mmol/L (ref 3.5–5.1)
Sodium: 131 mmol/L — ABNORMAL LOW (ref 135–145)

## 2022-08-19 LAB — CBC
HCT: 26.1 % — ABNORMAL LOW (ref 39.0–52.0)
Hemoglobin: 9.1 g/dL — ABNORMAL LOW (ref 13.0–17.0)
MCH: 32.5 pg (ref 26.0–34.0)
MCHC: 34.9 g/dL (ref 30.0–36.0)
MCV: 93.2 fL (ref 80.0–100.0)
Platelets: 220 10*3/uL (ref 150–400)
RBC: 2.8 MIL/uL — ABNORMAL LOW (ref 4.22–5.81)
RDW: 14.4 % (ref 11.5–15.5)
WBC: 14.4 10*3/uL — ABNORMAL HIGH (ref 4.0–10.5)
nRBC: 0 % (ref 0.0–0.2)

## 2022-08-19 LAB — CBG MONITORING, ED: Glucose-Capillary: 195 mg/dL — ABNORMAL HIGH (ref 70–99)

## 2022-08-19 LAB — GLUCOSE, CAPILLARY: Glucose-Capillary: 226 mg/dL — ABNORMAL HIGH (ref 70–99)

## 2022-08-19 SURGERY — ESOPHAGOGASTRODUODENOSCOPY (EGD) WITH PROPOFOL
Anesthesia: Monitor Anesthesia Care

## 2022-08-19 MED ORDER — LIDOCAINE VISCOUS HCL 2 % MT SOLN
15.0000 mL | Freq: Once | OROMUCOSAL | Status: AC
Start: 2022-08-19 — End: 2022-08-19
  Administered 2022-08-19: 15 mL via OROMUCOSAL
  Filled 2022-08-19: qty 15

## 2022-08-19 MED ORDER — SODIUM CHLORIDE 0.9 % IV SOLN: INTRAVENOUS | Status: DC

## 2022-08-19 MED ORDER — LIDOCAINE 2% (20 MG/ML) 5 ML SYRINGE
INTRAMUSCULAR | Status: DC | PRN
  Administered 2022-08-19: 80 mg via INTRAVENOUS

## 2022-08-19 MED ORDER — PROPOFOL 10 MG/ML IV BOLUS
INTRAVENOUS | Status: DC | PRN
  Administered 2022-08-19: 30 mg via INTRAVENOUS

## 2022-08-19 MED ORDER — PHENYLEPHRINE 80 MCG/ML (10ML) SYRINGE FOR IV PUSH (FOR BLOOD PRESSURE SUPPORT)
PREFILLED_SYRINGE | INTRAVENOUS | Status: DC | PRN
  Administered 2022-08-19: 160 ug via INTRAVENOUS
  Administered 2022-08-19: 80 ug via INTRAVENOUS
  Administered 2022-08-19: 160 ug via INTRAVENOUS

## 2022-08-19 MED ORDER — PROPOFOL 500 MG/50ML IV EMUL
INTRAVENOUS | Status: DC | PRN
  Administered 2022-08-19: 125 ug/kg/min via INTRAVENOUS

## 2022-08-19 MED ORDER — ALUM & MAG HYDROXIDE-SIMETH 200-200-20 MG/5ML PO SUSP
30.0000 mL | Freq: Once | ORAL | Status: AC
  Administered 2022-08-19: 30 mL via ORAL
  Filled 2022-08-19: qty 30

## 2022-08-19 SURGICAL SUPPLY — 15 items
BLOCK BITE 60FR ADLT L/F BLUE (MISCELLANEOUS) ×2 IMPLANT
ELECT REM PT RETURN 9FT ADLT (ELECTROSURGICAL) IMPLANT
ELECTRODE REM PT RTRN 9FT ADLT (ELECTROSURGICAL) IMPLANT
FORCEP RJ3 GP 1.8X160 W-NEEDLE (CUTTING FORCEPS) IMPLANT
FORCEPS BIOP RAD 4 LRG CAP 4 (CUTTING FORCEPS) IMPLANT
NDL SCLEROTHERAPY 25GX240 (NEEDLE) IMPLANT
NEEDLE SCLEROTHERAPY 25GX240 (NEEDLE) IMPLANT
PROBE APC STR FIRE (PROBE) IMPLANT
PROBE INJECTION GOLD (MISCELLANEOUS)
PROBE INJECTION GOLD 7FR (MISCELLANEOUS) IMPLANT
SNARE SHORT THROW 13M SML OVAL (MISCELLANEOUS) IMPLANT
SYR 50ML LL SCALE MARK (SYRINGE) IMPLANT
TUBING ENDO SMARTCAP PENTAX (MISCELLANEOUS) ×4 IMPLANT
TUBING IRRIGATION ENDOGATOR (MISCELLANEOUS) ×2 IMPLANT
WATER STERILE IRR 1000ML POUR (IV SOLUTION) IMPLANT

## 2022-08-19 NOTE — ED Notes (Signed)
Patient speaking with Dr at bedside.

## 2022-08-19 NOTE — Anesthesia Preprocedure Evaluation (Signed)
Anesthesia Evaluation  Patient identified by MRN, date of birth, ID band Patient awake    Reviewed: Allergy & Precautions, NPO status , Patient's Chart, lab work & pertinent test results  History of Anesthesia Complications (+) PONV and history of anesthetic complications  Airway Mallampati: II  TM Distance: >3 FB Neck ROM: Full    Dental  (+) Missing,    Pulmonary COPD, former smoker,    Pulmonary exam normal        Cardiovascular hypertension, Pt. on medications Normal cardiovascular exam     Neuro/Psych negative neurological ROS  negative psych ROS   GI/Hepatic Neg liver ROS, hiatal hernia, PUD, GERD  Medicated and Controlled,  Endo/Other  diabetes, Type 2, Oral Hypoglycemic Agents  Renal/GU ARFRenal disease  negative genitourinary   Musculoskeletal  (+) Arthritis , Rheumatoid disorders,    Abdominal   Peds  Hematology  (+) Blood dyscrasia (Hgb 9.1), anemia ,   Anesthesia Other Findings Day of surgery medications reviewed with patient.  Reproductive/Obstetrics negative OB ROS                             Anesthesia Physical Anesthesia Plan  ASA: 3  Anesthesia Plan: MAC   Post-op Pain Management: Minimal or no pain anticipated   Induction:   PONV Risk Score and Plan: Treatment may vary due to age or medical condition and Propofol infusion  Airway Management Planned: Natural Airway and Nasal Cannula  Additional Equipment: None  Intra-op Plan:   Post-operative Plan:   Informed Consent: I have reviewed the patients History and Physical, chart, labs and discussed the procedure including the risks, benefits and alternatives for the proposed anesthesia with the patient or authorized representative who has indicated his/her understanding and acceptance.       Plan Discussed with: CRNA  Anesthesia Plan Comments:         Anesthesia Quick Evaluation

## 2022-08-19 NOTE — ED Notes (Signed)
Patient asked for something to drink. Patient was reminded that he was NPO due to having a procedure this morning. Patients fluids were increased per Dr.

## 2022-08-19 NOTE — Op Note (Signed)
[facility] Patient Name: [name] Procedure Date : 08/19/2022 MRN: [id number] Attending MD: Ladene Artist , MD Date of Birth: 09-Feb-1946 CSN: [id number] Age: 76 Admit Type: Inpatient Procedure:                Upper GI endoscopy Indications:              Hematemesis Providers:                Pricilla Riffle. Fuller Plan, MD, Burtis Junes, RN, Gloris Ham, Technician Referring MD:             Angelica Pou, MD Medicines:                Monitored Anesthesia Care Complications:            No immediate complications. Estimated Blood Loss:     Estimated blood loss: none. Procedure:                Pre-Anesthesia Assessment:                           - Prior to the procedure, a History and Physical                            was performed, and patient medications and                            allergies were reviewed. The patient's tolerance of                            previous anesthesia was also reviewed. The risks                            and benefits of the procedure and the sedation                            options and risks were discussed with the patient.                            All questions were answered, and informed consent                            was obtained. Prior Anticoagulants: The patient has                            taken no previous anticoagulant or antiplatelet                            agents. ASA Grade Assessment: III - A patient with                            severe systemic disease. After reviewing the risks  and benefits, the patient was deemed in                            satisfactory condition to undergo the procedure.                           After obtaining informed consent, the endoscope was                            passed under direct vision. Throughout the                            procedure, the patient's blood pressure, pulse, and                             oxygen saturations were monitored continuously. The                            GIF-H190 ([id number]) Olympus endoscope was introduced                            through the mouth, and advanced to the third part                            of duodenum. The upper GI endoscopy was                            accomplished without difficulty. The patient                            tolerated the procedure well. Scope In: Scope Out: Findings:      LA Grade D (one or more mucosal breaks involving at least 75% of       esophageal circumference) esophagitis with no active bleeding was found       in the mid and distal esophagus. Biopsies were taken with a cold forceps       for histology.      One benign-appearing, intrinsic mild stenosis was found at the       gastroesophageal junction. This stenosis measured 1.3 cm (inner       diameter) x less than one cm (in length). The stenosis was traversed.      The exam of the stomach was otherwise normal. Biopsies were taken with a       cold forceps for histology.      A small hiatal hernia was present.      Five non-bleeding cratered duodenal ulcers, one with a flat pigmented       spot (Forrest Class IIc) and the rest clean based (Forrest Class III),       were found in the duodenal bulb and in the second portion of the       duodenum. The largest lesion was 8 mm in largest dimension.      The third portion of the duodenum was normal. Impression:               - LA Grade D reflux esophagitis with no bleeding.  Biopsied.                           - Benign-appearing esophageal stenosis.                           - Small hiatal hernia.                           - Otherwise normal stomach. Biopsied.                           - Non-bleeding duodenal ulcers, one with a flat                            pigmented spot (Forrest Class IIc) and the rest                            were clean based (Forrest Class III). Recommendation:            - Bleeding from esophagitis or duodenal ulcers.                           - No aspirin, ibuprofen, naproxen, or other                            non-steroidal anti-inflammatory drugs.                           - Continue pantoprazole 40 mg bid, IV in hospital                            and PO at discharge.                           - Await pathology.                           - Outpatient GI follow up with Dr. Janeann Forehand GI Procedure Code(s):        --- Professional ---                           [phone], Esophagogastroduodenoscopy, flexible,                            transoral; with biopsy, single or multiple Diagnosis Code(s):        --- Professional ---                           K21.00, Gastro-esophageal reflux disease with                            esophagitis, without bleeding                           K22.2, Esophageal obstruction  K44.9, Diaphragmatic hernia without obstruction or                            gangrene                           K26.9, Duodenal ulcer, unspecified as acute or                            chronic, without hemorrhage or perforation                           K92.0, Hematemesis CPT copyright 2019 [employer]. All rights reserved. The codes documented in this report are preliminary and upon coder review may  be revised to meet current compliance requirements. Ladene Artist, MD 08/19/2022 12:00:10 PM This report has been signed electronically. Number of Addenda: 0

## 2022-08-19 NOTE — ED Notes (Addendum)
Pt transported to Endoscopy via stretcher with Protonix drip, fluids and belongings. Report given and care endorsed

## 2022-08-19 NOTE — Progress Notes (Signed)
Went to assess patient regarding falls that he had at home as well as persistent abdominal pain following message from his RN.  Patient had multiple falls at home in the past couple weeks.  He did not lose consciousness at any point.  He denies any chest pain, dyspnea, palpitations.  He believes that he had increasing weakness due to nausea and vomiting and that this was the cause of his falls.  He did endorse hitting his head during at least one of his falls, but did not endorse any subsequent bleeding.  He does have a small abrasion on his forehead.  He believes he was taking Eliquis and possibly Lovenox as well during the time when he first fell (this was about a week ago), though he stopped taking both in the past few days because he was not tolerating p.o.  His abdominal pain is persistent and similar compared to before, though he endorses some mild relief.  It is epigastric and feels sharp.  He has not had much to eat or drink yesterday so he is unsure whether it is postprandial or not.  He had been placed on PPI infusion.  Exam: Hemodynamically stable and saturating well on room air General: NAD ABD: Soft, nondistended, tenderness to palpation over epigastric region Neuro: Alert and oriented x3.  5 out of 5 strength in all extremities.  CNs grossly intact.  #Falls Reports multiple falls on [facility] and is hit his head.  Abrasion on exam but no other focal neurological deficits. - CT head without contrast - Continuing to hold [facility]  #Abdominal pain Persistent epigastric abdominal pain - Trial GI cocktail

## 2022-08-19 NOTE — Evaluation (Signed)
Physical Therapy Evaluation Patient Details Name: [name] MRN: [id number] DOB: 1946-01-10 Today's Date: 08/19/2022  History of Present Illness  76 y.o. male presents to [facility] hospital on 08/18/2022 with a 2 week history of tarry stools and dark emesis. Pt also reports dizziness and 2 falls on the day of admission. Pt underwent upper GI endoscopy on 8/24. PMH includes DMII, HTN, RA.  Clinical Impression  Pt presents to PT with deficits in functional mobility, gait, balance, endurance, strength, power. Pt currently requries physical assistance to transfer and perform lower body ADLs. PT session limited by dizziness, found to have soft BP of 97/46. Pt is at an increased risk for falls due to dizziness along with generalized weakness. PT recommends SNF placement at this time due to no reliable caregivers identified and high falls risk.       Recommendations for follow up therapy are one component of a multi-disciplinary discharge planning process, led by the attending physician.  Recommendations may be updated based on patient status, additional functional criteria and insurance authorization.  Follow Up Recommendations Skilled nursing-short term rehab (<3 hours/day) (may progress to HHPT but would likely require intermittent assistance from family/friends) Can patient physically be transported by private vehicle: Yes    Assistance Recommended at Discharge Intermittent Supervision/Assistance  Patient can return home with the following  A little help with walking and/or transfers;A little help with bathing/dressing/bathroom;Assistance with cooking/housework;Help with stairs or ramp for entrance;Assist for transportation    Equipment Recommendations Rolling walker (2 wheels);BSC/3in1  Recommendations for Other Services       Functional Status Assessment Patient has had a recent decline in their functional status and demonstrates the ability to make significant improvements in function in a  reasonable and predictable amount of time.     Precautions / Restrictions Precautions Precautions: Fall Precaution Comments: monitor BP, 2 falls prior to admission Restrictions Weight Bearing Restrictions: No      Mobility  Bed Mobility Overal bed mobility: Needs Assistance Bed Mobility: Supine to Sit, Sit to Supine     Supine to sit: Min assist Sit to supine: Supervision        Transfers Overall transfer level: Needs assistance Equipment used: Rolling walker (2 wheels) Transfers: Sit to/from Stand Sit to Stand: Min assist           General transfer comment: minA for transfer from bed and shower seat    Ambulation/Gait Ambulation/Gait assistance: Min guard Gait Distance (Feet): 25 Feet (additional trial of 15') Assistive device: Rolling walker (2 wheels) Gait Pattern/deviations: Step-through pattern Gait velocity: reduced Gait velocity interpretation: <1.8 ft/sec, indicate of risk for recurrent falls   General Gait Details: pt with slowed step-through gait, reduced stride length  Stairs            Wheelchair Mobility    Modified Rankin (Stroke Patients Only)       Balance Overall balance assessment: Needs assistance Sitting-balance support: No upper extremity supported, Feet supported Sitting balance-Leahy Scale: Fair     Standing balance support: Single extremity supported, Bilateral upper extremity supported, Reliant on assistive device for balance Standing balance-Leahy Scale: Poor                               Pertinent Vitals/Pain Pain Assessment Pain Assessment: Faces Faces Pain Scale: Hurts even more Pain Location: ankle Pain Descriptors / Indicators: Sore Pain Intervention(s): Monitored during session    Home Living Family/patient expects to be  discharged to:: Private residence Living Arrangements: Alone Available Help at Discharge:  (friend PRN) Type of Home: House Home Access: Level entry       Home Layout:  One [place]:  (reports his ex-wife has a walker he could borrow)      Prior Function Prior Level of Function : Independent/Modified Independent;Working/employed;Driving             Mobility Comments: owns a Engineer, mining   Dominant Hand: Right    Extremity/Trunk Assessment   Upper Extremity Assessment Upper Extremity Assessment: Generalized weakness    Lower Extremity Assessment Lower Extremity Assessment: Generalized weakness    Cervical / Trunk Assessment Cervical / Trunk Assessment: Normal  Communication   Communication: No difficulties  Cognition Arousal/Alertness: Awake/alert Behavior During Therapy: WFL for tasks assessed/performed Overall Cognitive Status: Impaired/Different from baseline Area of Impairment: Problem solving                             Problem Solving: Slow processing          General Comments General comments (skin integrity, edema, etc.): pt reports dizziness after ambulating to bahtroom, PT assists pt in return to bed where BP found to be 97/46    Exercises     Assessment/Plan    PT Assessment Patient needs continued PT services  PT Problem List Decreased strength;Decreased activity tolerance;Decreased balance;Decreased mobility;Decreased cognition;Decreased knowledge of use of DME;Decreased safety awareness;Cardiopulmonary status limiting activity       PT Treatment Interventions DME instruction;Gait training;Functional mobility training;Therapeutic activities;Therapeutic exercise;Balance training;Neuromuscular re-education;Stair training;Patient/family education    PT Goals (Current goals can be found in the Care Plan section)  Acute Rehab PT Goals Patient Stated Goal: to go home, return to independence PT Goal Formulation: With patient Time For Goal Achievement: 09/02/22 Potential to Achieve Goals: Fair    Frequency Min 3X/week     Co-evaluation                AM-PAC PT "6 Clicks" Mobility  Outcome Measure Help needed turning from your back to your side while in a flat bed without using bedrails?: A Little Help needed moving from lying on your back to sitting on the side of a flat bed without using bedrails?: A Little Help needed moving to and from a bed to a chair (including a wheelchair)?: A Little Help needed standing up from a chair using your arms (e.g., wheelchair or bedside chair)?: A Little Help needed to walk in hospital room?: A Little Help needed climbing 3-5 steps with a railing? : Total 6 Click Score: 16    End of Session   Activity Tolerance: Treatment limited secondary to medical complications (Comment) (smyptomatic hypotension) Patient left: in bed;with call bell/phone within reach;with bed alarm set Nurse Communication: Mobility status PT Visit Diagnosis: Other abnormalities of gait and mobility (R26.89);Muscle weakness (generalized) (M62.81)    Time: 1308-6578 PT Time Calculation (min) (ACUTE ONLY): 18 min   Charges:   PT Evaluation $PT Eval Low Complexity: Pierpont, PT, DPT Acute Rehabilitation Office [phone]   Zenaida Niece 08/19/2022, 5:40 PM

## 2022-08-19 NOTE — ED Notes (Signed)
Patient was incontinent to stool. Stool was primarily black in color and liquid. Patient was not aware that he had a bowel movement.

## 2022-08-19 NOTE — ED Notes (Signed)
Called to verify purpleman at this time. Chart being reviewed by 6N.

## 2022-08-19 NOTE — Transfer of Care (Signed)
Immediate Anesthesia Transfer of Care Note  Patient: [name]  Procedure(s) Performed: ESOPHAGOGASTRODUODENOSCOPY (EGD) WITH PROPOFOL BIOPSY  Patient Location: PACU  Anesthesia Type:MAC  Level of Consciousness: awake, alert  and oriented  Airway & Oxygen Therapy: Patient Spontanous Breathing and Patient connected to nasal cannula oxygen  Post-op Assessment: Report given to RN and Post -op Vital signs reviewed and stable  Post vital signs: Reviewed and stable  Last Vitals:  Vitals Value Taken Time  BP 102/48 08/19/22 1149  Temp    Pulse 78   Resp 22 08/19/22 1151  SpO2 98%   Vitals shown include unvalidated device data.  Last Pain:  Vitals:   08/19/22 0942  TempSrc: Temporal  PainSc: 0-No pain         Complications: No notable events documented.

## 2022-08-19 NOTE — Anesthesia Postprocedure Evaluation (Signed)
Anesthesia Post Note  Patient: [name]  Procedure(s) Performed: ESOPHAGOGASTRODUODENOSCOPY (EGD) WITH PROPOFOL BIOPSY     Patient location during evaluation: PACU Anesthesia Type: MAC Level of consciousness: awake and alert Pain management: pain level controlled Vital Signs Assessment: post-procedure vital signs reviewed and stable Respiratory status: spontaneous breathing, nonlabored ventilation and respiratory function stable Cardiovascular status: blood pressure returned to baseline Postop Assessment: no apparent nausea or vomiting Anesthetic complications: no   No notable events documented.  Last Vitals:  Vitals:   08/19/22 1205 08/19/22 1220  BP: (!) 102/45 (!) 109/52  Pulse: 89 88  Resp: 17 19  Temp:    SpO2: 100% 99%    Last Pain:  Vitals:   08/19/22 1150  TempSrc:   PainSc: 0-No pain                 Marthenia Rolling

## 2022-08-19 NOTE — Inpatient Diabetes Management (Signed)
Inpatient Diabetes Program Recommendations  AACE/ADA: New Consensus Statement on Inpatient Glycemic Control (2015)  Target Ranges:  Prepandial:   less than 140 mg/dL      Peak postprandial:   less than 180 mg/dL (1-2 hours)      Critically ill patients:  140 - 180 mg/dL   Lab Results  Component Value Date   GLUCAP 226 (H) 08/19/2022   HGBA1C 6.7 (H) 01/27/2021    Review of Glycemic Control  Latest Reference Range & Units 08/18/22 21:56 08/19/22 01:42 08/19/22 11:50  Glucose-Capillary 70 - 99 mg/dL 234 (H) 195 (H) 226 (H)   Diabetes history: DM 2 Outpatient Diabetes medications: metformin 500 mg Daily Current orders for Inpatient glycemic control:  None  Inpatient Diabetes Program Recommendations:    Glucose trends above inpatient goal:  -  consider Novolog 0-6 units tid.  Thanks,  Tama Headings RN, MSN, BC-ADM Inpatient Diabetes Coordinator Team Pager [phone] (8a-5p)

## 2022-08-19 NOTE — Progress Notes (Signed)
Subjective:   Summary: [name] is a 76 y.o. year old male currently admitted on the IMTS HD#0 for acute GI bleed.  Overnight Events: NOE   Pt was seen today bedside, and states he is feeling better. He is tired after coming from EGD. Denies abdominal pain, nausea, or vomiting.   Objective:  Vital signs in last 24 hours: Vitals:   08/19/22 1150 08/19/22 1205 08/19/22 1220 08/19/22 1258  BP: (!) 102/48 (!) 102/45 (!) 109/52 128/60  Pulse:  89 88 93  Resp: '18 17 19 17  '$ Temp: 98.8 F (37.1 C)   98.2 F (36.8 C)  TempSrc:    Oral  SpO2: 98% 100% 99% 99%  Weight:      Height:       Supplemental O2: 96% Room Air    Physical Exam:  Constitutional: elderly gentleman, in no acute distress Cardiovascular: RRR, no murmurs, rubs or gallops Pulmonary/Chest: normal work of breathing on room air, lungs clear to auscultation bilaterally Abdominal: soft, non-tender, non-distended Skin: warm and dry, patches of melena present bilaterally on LE, decreased skin turgor, erythema present on R ankle. L Ankle is +2 edema, R is +1 Extremities: upper/lower extremity pulses 2+, no lower extremity edema present  [facility] Weights   08/19/22 0942  Weight: 81.6 kg     Intake/Output Summary (Last 24 hours) at 08/19/2022 1543 Last data filed at 08/19/2022 1142 Gross per 24 hour  Intake 900 ml  Output 300 ml  Net 600 ml   Net IO Since Admission: 600 mL [08/19/22 1543]  Pertinent Labs:    Latest Ref Rng & Units 08/19/2022    4:00 AM 08/18/2022    3:08 PM 08/18/2022    2:18 PM  CBC  WBC 4.0 - 10.5 K/uL 14.4   21.3   Hemoglobin 13.0 - 17.0 g/dL 9.1  11.6  10.5   Hematocrit 39.0 - 52.0 % 26.1  34.0  29.4   Platelets 150 - 400 K/uL 220   274        Latest Ref Rng & Units 08/19/2022    4:00 AM 08/18/2022    3:08 PM 08/18/2022    2:18 PM  CMP  Glucose 70 - 99 mg/dL 216  356  347   BUN 8 - 23 mg/dL 116  >130  134   Creatinine 0.61 - 1.24 mg/dL 2.25  2.80  2.87    Sodium 135 - 145 mmol/L 131  126  129   Potassium 3.5 - 5.1 mmol/L 3.6  3.7  3.4   Chloride 98 - 111 mmol/L 99  89  89   CO2 22 - 32 mmol/L 19   20   Calcium 8.9 - 10.3 mg/dL 7.7   8.5   Total Protein 6.5 - 8.1 g/dL   5.9   Total Bilirubin 0.3 - 1.2 mg/dL   0.5   Alkaline Phos 38 - 126 U/L   66   AST 15 - 41 U/L   16   ALT 0 - 44 U/L   13    Imaging: CT HEAD WO CONTRAST (5MM)  Result Date: 08/19/2022 CLINICAL DATA:  Head trauma, minor (Age >= 65y) fall on eliquis+lovenox+plavix at home EXAM: CT HEAD WITHOUT CONTRAST TECHNIQUE: Contiguous axial images were obtained from the base of the skull through the vertex without intravenous contrast. RADIATION DOSE REDUCTION: This exam was performed according to the departmental  dose-optimization program which includes automated exposure control, adjustment of the mA and/or kV according to patient size and/or use of iterative reconstruction technique. COMPARISON:  None Available. FINDINGS: Brain: There is atrophy and chronic small vessel disease changes. No acute intracranial abnormality. Specifically, no hemorrhage, hydrocephalus, mass lesion, acute infarction, or significant intracranial injury. Vascular: No hyperdense vessel or unexpected calcification. Skull: No acute calvarial abnormality. Sinuses/Orbits: No acute findings Other: None IMPRESSION: Atrophy, chronic microvascular disease. No acute intracranial abnormality. Electronically Signed   By: Rolm Baptise M.D.   On: 08/19/2022 01:40   CT Abdomen Pelvis Wo Contrast  Result Date: 08/18/2022 CLINICAL DATA:  Abdominal pain, acute, nonlocalized gi bleeding EXAM: CT ABDOMEN AND PELVIS WITHOUT CONTRAST TECHNIQUE: Multidetector CT imaging of the abdomen and pelvis was performed following the standard protocol without IV contrast. RADIATION DOSE REDUCTION: This exam was performed according to the departmental dose-optimization program which includes automated exposure control, adjustment of the mA and/or kV  according to patient size and/or use of iterative reconstruction technique. COMPARISON:  05/28/2015 FINDINGS: Lower chest: Fibrotic changes within the bilateral lung bases with bronchiectasis, progressed from prior. Small superimposed opacity at the right lung base posteriorly may represent atelectasis or consolidation. Heart size within normal limits. Abnormal appearance of the distal esophagus which is circumferentially thickened and fluid-filled (series 3, image 11). Extensive aortic and coronary artery atherosclerosis. Prominent bilateral gynecomastia. Hepatobiliary: No focal liver abnormality is seen. Status post cholecystectomy. No biliary dilatation. Pancreas: Unremarkable. No pancreatic ductal dilatation or surrounding inflammatory changes. Spleen: Normal in size without focal abnormality. Adrenals/Urinary Tract: Unremarkable adrenal glands. 4.5 cm right renal cyst, which requires no follow-up. 6 mm nonobstructing stone within the lower pole of the left kidney. No hydronephrosis. Urinary bladder is incompletely distended. Stomach/Bowel: Stomach is fluid distended but appears otherwise unremarkable. Appendix appears normal. Scattered colonic diverticulosis. No evidence of bowel wall thickening, distention, or inflammatory changes. Vascular/Lymphatic: Interval aortobifemoral stent grafting, suboptimally assessed in the absence of contrast. Advanced atherosclerosis of the native aorta. No abdominopelvic lymphadenopathy. Reproductive: Prostate is unremarkable. Other: No free fluid. No abdominopelvic fluid collection. No pneumoperitoneum. No abdominal wall hernia. Musculoskeletal: Chronic posttraumatic deformities of the left hemipelvis. Prior left femoral ORIF. No acute bony findings. IMPRESSION: 1. Abnormal appearance of the distal esophagus which is circumferentially thickened and fluid-filled. Findings are suggestive of esophagitis. Further evaluation with upper endoscopy is recommended. 2. Fibrotic changes  within the bilateral lung bases with bronchiectasis, progressed from prior study. Small superimposed opacity at the right lung base posteriorly may represent atelectasis or pneumonia. 3. Nonobstructing left nephrolithiasis. 4. Colonic diverticulosis without evidence for acute diverticulitis. 5. Interval aortobifemoral stent grafting, suboptimally assessed in the absence of contrast. 6. Aortic and coronary artery atherosclerosis (ICD10-I70.0). Electronically Signed   By: Davina Poke D.O.   On: 08/18/2022 16:17     Assessment/Plan:   Principal Problem:   UGIB (upper gastrointestinal bleed) Active Problems:   Hematemesis with nausea   Patient Summary: [name] is a 76 y.o. with a pertinent PMH of erosive esophagitis, esophageal stricture requiring dilations, Type 2 DM, erosive esophagitis, who presented with severe epigastric pain and falls, and admitted for acute GI bleed.    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 These findings may  have indicated that bleeding has halted. Pt states he is feeling better in general. Hb is currently at 9.1, and denies any dizziness.   Plan:  - Appreciate GI recommendations - No aspirin, ibuprofen, naproxen, or NSAIDs - Continue Pantoprazole '40mg'$  BID IV - Await biopsy results  - Outpatient follow up with Dr. Leroy Sea at [place] monitoring H+H -PT/OT consult for ambulation assistance   #AKI Pt had decreased skin turgor on examination. Creatinine was 2.87 on admission, and is currently 2.25. He is urinating normally.   Plan:  - Saline infusion  - Monitor BMP for kidney function   #T2DM Pt's insulin was held last night as it is a new medication for him, and he was  NPO for EGD done today. Resumed his diet with clear liquids, and will monitor glucose levels. At home, patient was only on metformin 500 BID. Fasting glucose today was 226.  Plan:  -Continue CBG monitoring  - Semglee 10U   Code: Full    Dispo: Anticipated discharge in less than two midnights.  Drucie Opitz, MD PGY-1 Internal Medicine Resident Pager Number [phone] Please contact the on call pager after 5 pm and on weekends at [phone].

## 2022-08-19 NOTE — Interval H&P Note (Signed)
History and Physical Interval Note:  08/19/2022 11:05 AM  [name]  has presented today for surgery, with the diagnosis of upper GI bleed.  The various methods of treatment have been discussed with the patient and family. After consideration of risks, benefits and other options for treatment, the patient has consented to  Procedure(s): ESOPHAGOGASTRODUODENOSCOPY (EGD) WITH PROPOFOL (N/A) as a surgical intervention.  The patient's history has been reviewed, patient examined, no change in status, stable for surgery.  I have reviewed the patient's chart and labs.  Questions were answered to the patient's satisfaction.     Pricilla Riffle. Fuller Plan

## 2022-08-20 ENCOUNTER — Encounter (HOSPITAL_COMMUNITY): Payer: Self-pay | Admitting: Gastroenterology

## 2022-08-20 DIAGNOSIS — L8990 Pressure ulcer of unspecified site, unspecified stage: Secondary | ICD-10-CM | POA: Insufficient documentation

## 2022-08-20 LAB — CBC
HCT: 20.4 % — ABNORMAL LOW (ref 39.0–52.0)
Hemoglobin: 7.2 g/dL — ABNORMAL LOW (ref 13.0–17.0)
MCH: 32.7 pg (ref 26.0–34.0)
MCHC: 35.3 g/dL (ref 30.0–36.0)
MCV: 92.7 fL (ref 80.0–100.0)
Platelets: 197 10*3/uL (ref 150–400)
RBC: 2.2 MIL/uL — ABNORMAL LOW (ref 4.22–5.81)
RDW: 14.9 % (ref 11.5–15.5)
WBC: 10.3 10*3/uL (ref 4.0–10.5)
nRBC: 0 % (ref 0.0–0.2)

## 2022-08-20 LAB — GLUCOSE, CAPILLARY
Glucose-Capillary: 180 mg/dL — ABNORMAL HIGH (ref 70–99)
Glucose-Capillary: 106 mg/dL — ABNORMAL HIGH (ref 70–99)
Glucose-Capillary: 147 mg/dL — ABNORMAL HIGH (ref 70–99)

## 2022-08-20 LAB — HEMOGLOBIN A1C
Hgb A1c MFr Bld: 6.8 % — ABNORMAL HIGH (ref 4.8–5.6)
Mean Plasma Glucose: 148.46 mg/dL

## 2022-08-20 MED ORDER — INSULIN ASPART 100 UNIT/ML IJ SOLN: 0.0000 [IU] | Freq: Three times a day (TID) | INTRAMUSCULAR | Status: DC

## 2022-08-20 MED ORDER — INSULIN ASPART 100 UNIT/ML IJ SOLN
0.0000 [IU] | Freq: Three times a day (TID) | INTRAMUSCULAR | Status: DC
  Administered 2022-08-20 – 2022-08-21 (×2): 3 [IU] via SUBCUTANEOUS
  Administered 2022-08-21: 2 [IU] via SUBCUTANEOUS
  Administered 2022-08-22: 3 [IU] via SUBCUTANEOUS
  Administered 2022-08-22: 2 [IU] via SUBCUTANEOUS
  Administered 2022-08-23: 8 [IU] via SUBCUTANEOUS
  Administered 2022-08-23: 2 [IU] via SUBCUTANEOUS
  Administered 2022-08-24: 3 [IU] via SUBCUTANEOUS
  Administered 2022-08-24: 2 [IU] via SUBCUTANEOUS
  Administered 2022-08-25: 3 [IU] via SUBCUTANEOUS
  Administered 2022-08-25: 2 [IU] via SUBCUTANEOUS
  Administered 2022-08-26: 3 [IU] via SUBCUTANEOUS
  Administered 2022-08-26 – 2022-08-27 (×3): 2 [IU] via SUBCUTANEOUS

## 2022-08-20 MED ORDER — INSULIN ASPART 100 UNIT/ML IJ SOLN
0.0000 [IU] | Freq: Every day | INTRAMUSCULAR | Status: DC
  Administered 2022-08-22: 2 [IU] via SUBCUTANEOUS

## 2022-08-20 NOTE — Evaluation (Signed)
Occupational Therapy Evaluation Patient Details Name: [name] MRN: [id number] DOB: 21-Dec-1946 Today's Date: 08/20/2022   History of Present Illness 76 y.o. male presents to [facility] hospital on 08/18/2022 with a 2 week history of tarry stools and dark emesis. Pt also reports dizziness and 2 falls on the day of admission. Pt underwent upper GI endoscopy on 8/24. PMH includes DMII, HTN, RA.   Clinical Impression   PT admitted with GIB with x2 falls. Pt currently with functional limitiations due to the deficits listed below (see OT problem list). Pt currently with cognitive deficits and concerns for medication management. OT to assess pillbox test this admission. Pt also with L ankle edema noted. OT educated on edema management and placed on pillows for elevation.  Pt will benefit from skilled OT to increase their independence and safety with adls and balance to allow discharge SNF.       Recommendations for follow up therapy are one component of a multi-disciplinary discharge planning process, led by the attending physician.  Recommendations may be updated based on patient status, additional functional criteria and insurance authorization.   Follow Up Recommendations  Skilled nursing-short term rehab (<3 hours/day)    Assistance Recommended at Discharge Intermittent Supervision/Assistance  Patient can return home with the following A lot of help with walking and/or transfers;A lot of help with bathing/dressing/bathroom;Assistance with cooking/housework;Assistance with feeding;Assist for transportation    Functional Status Assessment  Patient has had a recent decline in their functional status and demonstrates the ability to make significant improvements in function in a reasonable and predictable amount of time.  Equipment Recommendations  BSC/3in1 (TBA / RW)    Recommendations for Other Services       Precautions / Restrictions Precautions Precautions: Fall Precaution Comments:  monitor BP, 2 falls prior to admission      Mobility Bed Mobility Overal bed mobility: Modified Independent                  Transfers Overall transfer level: Needs assistance Equipment used: Rolling walker (2 wheels) Transfers: Sit to/from Stand Sit to Stand: Min guard           General transfer comment: pt reports L ankle is killing him but able to stand on it to step to [facility] without complaint. pt does complain wiht external rotation and noted to have edema      Balance Overall balance assessment: Mild deficits observed, not formally tested                                         ADL either performed or assessed with clinical judgement   ADL                                               Vision Baseline Vision/History: 1 Wears glasses Additional Comments: lost glasses     Perception     Praxis      Pertinent Vitals/Pain Pain Assessment Pain Assessment: Faces Pain Score: 8  (when swallowing) Pain Location: abdomen Pain Descriptors / Indicators: Sore ("it will grab you") Pain Intervention(s): Monitored during session     Hand Dominance Right   Extremity/Trunk Assessment Upper Extremity Assessment Upper Extremity Assessment: Generalized weakness   Lower Extremity Assessment Lower Extremity Assessment: Generalized weakness  Cervical / Trunk Assessment Cervical / Trunk Assessment: Normal   Communication Communication Communication: No difficulties   Cognition Arousal/Alertness: Awake/alert Behavior During Therapy: WFL for tasks assessed/performed Overall Cognitive Status: Impaired/Different from baseline Area of Impairment: Safety/judgement, Awareness, Problem solving                         Safety/Judgement: Decreased awareness of safety Awareness: Intellectual Problem Solving: Slow processing, Difficulty sequencing General Comments: reports birthday is next week. Pt birthday is [birth year]. pt  incontinent of stool and bladder and unaware     General Comments  L ankle edema, watch for skin integrity issues due to incontinence    Exercises Exercises: General Lower Extremity General Exercises - Lower Extremity Ankle Circles/Pumps: AROM, Left, 20 reps, Seated   Shoulder Instructions      Home Living Family/patient expects to be discharged to:: Private residence Living Arrangements: Alone   Type of Home: House Home Access: Level entry     Home Layout: One level     Bathroom Shower/Tub: Teacher, early years/pre: Standard     Home Equipment: Shower seat;Hand held shower head   Additional Comments: 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      Prior Functioning/Environment Prior Level of Function : Independent/Modified Independent;Working/employed;Driving             Mobility Comments: owns a Psychologist, educational Problem List: Impaired balance (sitting and/or standing);Decreased cognition;Decreased safety awareness;Decreased knowledge of use of DME or AE;Decreased knowledge of precautions      OT Treatment/Interventions: Self-care/ADL training;Therapeutic exercise;DME and/or AE instruction;Therapeutic activities;Cognitive remediation/compensation;Patient/family education;Balance training;Modalities    OT Goals(Current goals can be found in the care plan section) Acute Rehab OT Goals Patient Stated Goal: to get the hell out of here tomorrow OT Goal Formulation: With patient Time For Goal Achievement: 09/03/22 Potential to Achieve Goals: Fair  OT Frequency: Min 2X/week    Co-evaluation              AM-PAC OT "6 Clicks" Daily Activity     Outcome Measure Help from another person eating meals?: A Little Help from another person taking care of personal grooming?: A Little Help from another person toileting, which includes using toliet, bedpan, or urinal?: A Lot Help from another person bathing  (including washing, rinsing, drying)?: A Lot Help from another person to put on and taking off regular upper body clothing?: A Little Help from another person to put on and taking off regular lower body clothing?: A Lot 6 Click Score: 15   End of Session Equipment Utilized During Treatment: Rolling walker (2 wheels) Nurse Communication: Mobility status;Precautions  Activity Tolerance: Patient tolerated treatment well Patient left: in bed;with call bell/phone within reach;with bed alarm set  OT Visit Diagnosis: Unsteadiness on feet (R26.81);Muscle weakness (generalized) (M62.81)                Time: 5053-9767 OT Time Calculation (min): 21 min Charges:  OT General Charges $OT Visit: 1 Visit OT Evaluation $OT Eval Moderate Complexity: 1 Mod   Brynn, OTR/L  Acute Rehabilitation Services Office: [phone] .   Jeri Modena 08/20/2022, 3:04 PM

## 2022-08-20 NOTE — Progress Notes (Signed)
Subjective:   Summary: [name] is a 76 y.o. year old male currently admitted on the IMTS HD#1 for acute GI bleed.  Overnight Events: NOE  Patient was seen today at bedside this afternoon and states that he is feeling really crummy. He reports lightheadedness, thirst, mild abdominal pain, and a profound loss of appetite. Has not had a bowel movement in at least one day. Denies dizziness, lethargy, nausea, and vomiting. Treatment team discussed physical therapy's recommendation for SNF, to which the patient was reluctant but he also understands the importance of gaining strength before returning home.   Objective:  Vital signs in last 24 hours: Vitals:   08/20/22 0004 08/20/22 0500 08/20/22 0806 08/20/22 1149  BP: (!) 124/46 132/75 (!) 106/55 (!) 116/49  Pulse: 87 89 74 78  Resp: '18 18 16 16  '$ Temp: (!) 97.5 F (36.4 C) 97.7 F (36.5 C) 98.7 F (37.1 C) 98.1 F (36.7 C)  TempSrc: Oral Oral Oral Oral  SpO2: 100% 98% 99% 98%  Weight:      Height:       Supplemental O2: 98-100% Room Air    Physical Exam:  Constitutional: elderly gentleman, in no acute distress Cardiovascular: RRR, no murmurs, rubs or gallops, capillary refill 1sec Pulmonary/Chest: normal work of breathing on room air, lungs clear to auscultation bilaterally, no crackles or wheezing Abdominal: hyperactive bowel sounds in all four quadrants, soft, non-tender and non-distended Skin: warm and dry, patches of melena present bilaterally on LE, erythema present on R ankle Extremities: upper/lower extremity pulses 2+, no lower extremity edema present   [facility] Weights   08/19/22 0942  Weight: 81.6 kg     Intake/Output Summary (Last 24 hours) at 08/20/2022 1233 Last data filed at 08/20/2022 0300 Gross per 24 hour  Intake 723.91 ml  Output 700 ml  Net 23.91 ml   Net IO Since Admission: 623.91 mL [08/20/22 1233]  Pertinent Labs:    Latest Ref Rng & Units 08/20/2022    7:09 AM  08/19/2022    4:00 AM 08/18/2022    3:08 PM  CBC  WBC 4.0 - 10.5 K/uL 10.3  14.4    Hemoglobin 13.0 - 17.0 g/dL 7.2  9.1  11.6   Hematocrit 39.0 - 52.0 % 20.4  26.1  34.0   Platelets 150 - 400 K/uL 197  220         Latest Ref Rng & Units 08/19/2022    4:00 AM 08/18/2022    3:08 PM 08/18/2022    2:18 PM  CMP  Glucose 70 - 99 mg/dL 216  356  347   BUN 8 - 23 mg/dL 116  >130  134   Creatinine 0.61 - 1.24 mg/dL 2.25  2.80  2.87   Sodium 135 - 145 mmol/L 131  126  129   Potassium 3.5 - 5.1 mmol/L 3.6  3.7  3.4   Chloride 98 - 111 mmol/L 99  89  89   CO2 22 - 32 mmol/L 19   20   Calcium 8.9 - 10.3 mg/dL 7.7   8.5   Total Protein 6.5 - 8.1 g/dL   5.9   Total Bilirubin 0.3 - 1.2 mg/dL   0.5   Alkaline Phos 38 - 126 U/L   66   AST 15 - 41 U/L   16   ALT 0 - 44 U/L   13  Imaging: No results found.   Assessment/Plan:   Principal Problem:   UGIB (upper gastrointestinal bleed) Active Problems:   Hematemesis with nausea   Upper GI bleed   Patient Summary: [name] is a 76 y.o. with a pertinent PMH of erosive esophagitis, esophageal stricture requiring dilations, Type 2 DM, erosive esophagitis, who presented with severe epigastric pain and falls, and admitted for acute GI bleed.    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 These findings rule out a large acute bleed and suggest that the bleeding may have stopped altogether. Hgb yesterday 8-24 was 9.1 and it has since dropped to 7.2, but patient has been receiving intravenous fluids and other cell lines are mildly decreased as well. Patient reports feeling lightheaded today, which may be related to the dilutional anemia, reduced caloric intake, and  discontinuation of prednisone.  Plan:  - Appreciate GI recommendations - No aspirin, ibuprofen, naproxen, or NSAIDs - Continue Pantoprazole '40mg'$  BID IV - Continue monitoring hemoglobin and hematocrit with CBC - Resume prednisone '5mg'$  daily tomorrow 8-26 pending stable Hgb - Await biopsy results  - Outpatient follow up with Dr. Leroy Sea at [place]   #AKI Pt had decreased skin turgor on examination. Creatinine was 2.87 on admission, and is currently 2.25. He is urinating normally.   Plan:  - Saline infusion  - Monitor BMP for kidney function    #T2DM Pt's insulin was held last night as it is a new medication for him, and he was NPO for EGD done today. Resumed his diet with clear liquids, and will monitor glucose levels. At home, patient was only on metformin '500mg'$  BID. Last fasting glucose 8-24 was 226.  Plan:  -Continue CBG monitoring  -Hold glargine-yfgn 10U daily given loss of appetite and glucose 180 -Start sliding scale insulin    Code: Full    Dispo: Anticipated discharge in less than two midnights. Physical therapy is recommending transfer to skilled nursing facility.  Roswell Nickel, MD Internal Medicine PGY-1 Pager [phone]  Please contact the on call pager after 5 pm and on weekends at [phone].

## 2022-08-20 NOTE — Progress Notes (Signed)
Physical Therapy Treatment Patient Details Name: [name] MRN: [id number] DOB: 1946-01-16 Today's Date: 08/20/2022   History of Present Illness 76 y.o. male presents to [facility] hospital on 08/18/2022 with a 2 week history of tarry stools and dark emesis. Pt also reports dizziness and 2 falls on the day of admission. Pt underwent upper GI endoscopy on 8/24. PMH includes DMII, HTN, RA.    PT Comments    Pt admitted with above diagnosis. Pt was able to ambulate a little bit with RW with min guard assist however limited distance due to left ankle pain.  Pt states he just feels fatigued. Did some exercises as well. Agree with SNF to gain strength and endurance.  Pt currently with functional limitations due to balance and endurance deficits. Pt will benefit from skilled PT to increase their independence and safety with mobility to allow discharge to the venue listed below.      Recommendations for follow up therapy are one component of a multi-disciplinary discharge planning process, led by the attending physician.  Recommendations may be updated based on patient status, additional functional criteria and insurance authorization.  Follow Up Recommendations  Skilled nursing-short term rehab (<3 hours/day) (may progress to HHPT but would likely require intermittent assistance from family/friends) Can patient physically be transported by private vehicle: Yes   Assistance Recommended at Discharge Intermittent Supervision/Assistance  Patient can return home with the following A little help with walking and/or transfers;A little help with bathing/dressing/bathroom;Assistance with cooking/housework;Help with stairs or ramp for entrance;Assist for transportation   Equipment Recommendations  Rolling walker (2 wheels);BSC/3in1    Recommendations for Other Services       Precautions / Restrictions Precautions Precautions: Fall Precaution Comments: monitor BP, 2 falls prior to  admission Restrictions Weight Bearing Restrictions: No     Mobility  Bed Mobility   Bed Mobility: Supine to Sit, Sit to Supine     Supine to sit: Min assist Sit to supine: Supervision        Transfers Overall transfer level: Needs assistance Equipment used: Rolling walker (2 wheels) Transfers: Sit to/from Stand Sit to Stand: Min guard           General transfer comment: Stood to [employer] with guard assist.    Ambulation/Gait Ambulation/Gait assistance: Min guard, Min assist Gait Distance (Feet): 20 Feet Assistive device: Rolling walker (2 wheels) Gait Pattern/deviations: Step-through pattern, Decreased stride length, Decreased weight shift to left Gait velocity: reduced Gait velocity interpretation: <1.31 ft/sec, indicative of household ambulator   General Gait Details: pt with slowed step-through gait, reduced stride length. C/o left ankle pain with limited distance.   Stairs             Wheelchair Mobility    Modified Rankin (Stroke Patients Only)       Balance Overall balance assessment: Mild deficits observed, not formally tested Sitting-balance support: No upper extremity supported, Feet supported Sitting balance-Leahy Scale: Fair     Standing balance support: Bilateral upper extremity supported, Reliant on assistive device for balance, During functional activity Standing balance-Leahy Scale: Poor                              Cognition Arousal/Alertness: Awake/alert Behavior During Therapy: WFL for tasks assessed/performed Overall Cognitive Status: Impaired/Different from baseline Area of Impairment: Safety/judgement, Awareness, Problem solving  Safety/Judgement: Decreased awareness of safety Awareness: Intellectual Problem Solving: Slow processing, Difficulty sequencing          Exercises General Exercises - Lower Extremity Ankle Circles/Pumps: AROM, Left, 20 reps, Seated Quad Sets: AROM,  Both, 10 reps, Supine Long Arc Quad: AROM, Both, 10 reps, Seated Heel Slides: AROM, Both, 10 reps, Seated Hip Flexion/Marching: AROM, Both, 10 reps, Seated    General Comments General comments (skin integrity, edema, etc.): L ankle edema, watch for skin integrity issues due to incontinence      Pertinent Vitals/Pain Pain Assessment Pain Assessment: Faces Faces Pain Scale: Hurts even more Pain Location: abdomen Pain Descriptors / Indicators: Sore ("it will grab you") Pain Intervention(s): Limited activity within patient's tolerance, Monitored during session, Repositioned    Home Living Family/patient expects to be discharged to:: Private residence Living Arrangements: Alone   Type of Home: House Home Access: Level entry       Home Layout: One level Home Equipment: Shower seat;Hand held shower head Additional Comments: 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    Prior Function            PT Goals (current goals can now be found in the care plan section) Acute Rehab PT Goals Patient Stated Goal: to go home, return to independence Progress towards PT goals: Progressing toward goals    Frequency    Min 3X/week      PT Plan Current plan remains appropriate    Co-evaluation              AM-PAC PT "6 Clicks" Mobility   Outcome Measure  Help needed turning from your back to your side while in a flat bed without using bedrails?: A Little Help needed moving from lying on your back to sitting on the side of a flat bed without using bedrails?: A Little Help needed moving to and from a bed to a chair (including a wheelchair)?: A Little Help needed standing up from a chair using your arms (e.g., wheelchair or bedside chair)?: A Little Help needed to walk in hospital room?: A Little Help needed climbing 3-5 steps with a railing? : Total 6 Click Score: 16    End of Session Equipment Utilized During Treatment: Gait belt Activity Tolerance:  Patient limited by fatigue;Patient limited by pain (symptomatic hypotension) Patient left: in bed;with call bell/phone within reach;with bed alarm set Nurse Communication: Mobility status PT Visit Diagnosis: Other abnormalities of gait and mobility (R26.89);Muscle weakness (generalized) (M62.81)     Time: 1311-1330 PT Time Calculation (min) (ACUTE ONLY): 19 min  Charges:  $Gait Training: 8-22 mins                     Rhonda Linan M,PT Acute Rehab Services [place] 08/20/2022, 3:39 PM

## 2022-08-20 NOTE — TOC Progression Note (Signed)
Transition of Care [facility]) - Progression Note    Patient Details  Name: [name] MRN: [id number] Date of Birth: December 21, 1946  Transition of Care [facility]) CM/SW [place], RN Phone [phone]  08/20/2022, 12:28 PM  Clinical Narrative:    TOC acknowledges new recommendation for SNF per therapy evaluation. SW to follow with SNF workup.        Expected Discharge Plan and Services                                                 Social Determinants of Health (SDOH) Interventions    Readmission Risk Interventions     No data to display

## 2022-08-20 NOTE — Progress Notes (Addendum)
Preliminary path report called. Final report is pending. Esophageal biopsies showed mostly inflammation and necrotic debris however there was a small focus of atypical cells  I suspect the atypical cells are reactive to inflammation, necrosis however he should have a repeat EGD in ~ 8 weeks with his primary gastroenterologist, Dr. Hilbert Corrigan, to assess healing of esophagitis and to consider repeat biopsies as indicated. Continue pantoprazole 40 mg bid (or similar PPI bid). Advance diet to full liquids.

## 2022-08-21 ENCOUNTER — Inpatient Hospital Stay (HOSPITAL_COMMUNITY): Payer: Medicare Other

## 2022-08-21 DIAGNOSIS — K922 Gastrointestinal hemorrhage, unspecified: Secondary | ICD-10-CM | POA: Diagnosis not present

## 2022-08-21 DIAGNOSIS — M069 Rheumatoid arthritis, unspecified: Secondary | ICD-10-CM

## 2022-08-21 DIAGNOSIS — K2101 Gastro-esophageal reflux disease with esophagitis, with bleeding: Secondary | ICD-10-CM

## 2022-08-21 DIAGNOSIS — E119 Type 2 diabetes mellitus without complications: Secondary | ICD-10-CM

## 2022-08-21 DIAGNOSIS — Z794 Long term (current) use of insulin: Secondary | ICD-10-CM

## 2022-08-21 DIAGNOSIS — E876 Hypokalemia: Secondary | ICD-10-CM

## 2022-08-21 DIAGNOSIS — M79605 Pain in left leg: Secondary | ICD-10-CM

## 2022-08-21 DIAGNOSIS — K269 Duodenal ulcer, unspecified as acute or chronic, without hemorrhage or perforation: Secondary | ICD-10-CM

## 2022-08-21 LAB — MAGNESIUM: Magnesium: 1.4 mg/dL — ABNORMAL LOW (ref 1.7–2.4)

## 2022-08-21 LAB — HEMOGLOBIN AND HEMATOCRIT, BLOOD
HCT: 22.4 % — ABNORMAL LOW (ref 39.0–52.0)
Hemoglobin: 7.8 g/dL — ABNORMAL LOW (ref 13.0–17.0)

## 2022-08-21 LAB — BASIC METABOLIC PANEL
Anion gap: 7 (ref 5–15)
BUN: 30 mg/dL — ABNORMAL HIGH (ref 8–23)
CO2: 20 mmol/L — ABNORMAL LOW (ref 22–32)
Calcium: 7.7 mg/dL — ABNORMAL LOW (ref 8.9–10.3)
Chloride: 111 mmol/L (ref 98–111)
Creatinine, Ser: 1.21 mg/dL (ref 0.61–1.24)
GFR, Estimated: >60 mL/min (ref >60)
Glucose, Bld: 146 mg/dL — ABNORMAL HIGH (ref 70–99)
Potassium: 3.1 mmol/L — ABNORMAL LOW (ref 3.5–5.1)
Sodium: 138 mmol/L (ref 135–145)

## 2022-08-21 LAB — GLUCOSE, CAPILLARY
Glucose-Capillary: 150 mg/dL — ABNORMAL HIGH (ref 70–99)
Glucose-Capillary: 111 mg/dL — ABNORMAL HIGH (ref 70–99)
Glucose-Capillary: 167 mg/dL — ABNORMAL HIGH (ref 70–99)
Glucose-Capillary: 107 mg/dL — ABNORMAL HIGH (ref 70–99)

## 2022-08-21 LAB — CBC
HCT: 20.6 % — ABNORMAL LOW (ref 39.0–52.0)
Hemoglobin: 7.1 g/dL — ABNORMAL LOW (ref 13.0–17.0)
MCH: 32.7 pg (ref 26.0–34.0)
MCHC: 34.5 g/dL (ref 30.0–36.0)
MCV: 94.9 fL (ref 80.0–100.0)
Platelets: 200 10*3/uL (ref 150–400)
RBC: 2.17 MIL/uL — ABNORMAL LOW (ref 4.22–5.81)
RDW: 14.8 % (ref 11.5–15.5)
WBC: 8.6 10*3/uL (ref 4.0–10.5)
nRBC: 0 % (ref 0.0–0.2)

## 2022-08-21 LAB — PREPARE RBC (CROSSMATCH)

## 2022-08-21 MED ORDER — POTASSIUM CHLORIDE 10 MEQ/100ML IV SOLN
10.0000 meq | INTRAVENOUS | Status: AC
  Administered 2022-08-21 (×4): 10 meq via INTRAVENOUS
  Filled 2022-08-21 (×4): qty 100

## 2022-08-21 MED ORDER — SUCRALFATE 1 GM/10ML PO SUSP
1.0000 g | Freq: Three times a day (TID) | ORAL | Status: DC
Start: 2022-08-21 — End: 2022-08-27
  Administered 2022-08-21 – 2022-08-27 (×25): 1 g via ORAL
  Filled 2022-08-21 (×25): qty 10

## 2022-08-21 MED ORDER — OXYCODONE HCL 5 MG PO TABS
5.0000 mg | ORAL_TABLET | ORAL | Status: DC | PRN
  Administered 2022-08-21 – 2022-08-27 (×15): 5 mg via ORAL
  Filled 2022-08-21 (×16): qty 1

## 2022-08-21 MED ORDER — ACETAMINOPHEN 325 MG PO TABS
650.0000 mg | ORAL_TABLET | Freq: Four times a day (QID) | ORAL | Status: DC | PRN
  Administered 2022-08-21: 650 mg via ORAL
  Filled 2022-08-21: qty 2

## 2022-08-21 MED ORDER — DICLOFENAC SODIUM 1 % EX GEL
2.0000 g | Freq: Four times a day (QID) | CUTANEOUS | Status: DC
  Administered 2022-08-22 – 2022-08-27 (×10): 2 g via TOPICAL
  Filled 2022-08-21 (×2): qty 100

## 2022-08-21 MED ORDER — SODIUM CHLORIDE 0.9% IV SOLUTION: Freq: Once | INTRAVENOUS | Status: AC

## 2022-08-21 MED ORDER — ACETAMINOPHEN 500 MG PO TABS
1000.0000 mg | ORAL_TABLET | Freq: Three times a day (TID) | ORAL | Status: DC
Start: 2022-08-21 — End: 2022-08-23
  Administered 2022-08-21 – 2022-08-23 (×5): 1000 mg via ORAL
  Filled 2022-08-21 (×6): qty 2

## 2022-08-21 MED ORDER — PREDNISONE 5 MG PO TABS
7.5000 mg | ORAL_TABLET | Freq: Every day | ORAL | Status: DC
  Administered 2022-08-22 – 2022-08-23 (×2): 7.5 mg via ORAL
  Filled 2022-08-21 (×2): qty 2

## 2022-08-21 MED ORDER — MAGNESIUM SULFATE 2 GM/50ML IV SOLN
2.0000 g | Freq: Once | INTRAVENOUS | Status: AC
Start: 2022-08-21 — End: 2022-08-21
  Administered 2022-08-21: 2 g via INTRAVENOUS
  Filled 2022-08-21: qty 50

## 2022-08-21 NOTE — Progress Notes (Signed)
Palliative:  Chart reviewed.  Attempted goals of care discussion with patient - he declined telling me it was not a good time and asked me to try again tomorrow. He told me "no" when I asked if I could speak a family member.  Will ask a member of my team to attempt a follow up in coming day.  Juel Burrow, DNP, AGNP-C Palliative Medicine Team Team Phone # [phone]  Pager # [phone]  NO CHARGE

## 2022-08-21 NOTE — Consult Note (Signed)
Reason for Consult: Left ankle fracture  [name] is an 76 y.o. male.  HPI: This is a 76 year old male who presented to the ED on August 23 due to blood in stools.  He underwent a EGD on August 24.  Yesterday and this morning started complaining of left ankle pain.  He states that he sustained a fall at home prior to arrival at the hospital.  He does not quite remember what happened.  He states that he just misstepped and he hurt his ankle.  He states the pain is located on the lateral aspect of his ankle.  He has been weightbearing on it but is been having pain with weightbearing.  Denies any prior issues with the ankle prior to misstep.  Patient is a poor historian.  They were able to get x-rays and a CT scan done in the hospital.  Past Medical History:  Diagnosis Date   Aortic aneurysm ([place]) 05/2017   Chronic back pain    Diabetes mellitus without complication (HCC)    Dyslipidemia    GERD (gastroesophageal reflux disease)    H/O hiatal hernia    Hypertension    Melanoma ([place])    "back; left arm"   PONV (postoperative nausea and vomiting)    Rheumatoid arthritis ([place])    Spasm of esophagus     Past Surgical History:  Procedure Laterality Date   aneursym repair     aneursym repair and stent placement  05/2017   BIOPSY  08/19/2022   Procedure: BIOPSY;  Surgeon: Ladene Artist, MD;  Location: [facility] ENDOSCOPY;  Service: Gastroenterology;;   CARPAL TUNNEL RELEASE Bilateral    COLON SURGERY  2007   removed 18" of colon   ERCP W/ METAL STENT PLACEMENT  11/2005   [facility] Endo 12/14/2005   ESOPHAGOGASTRODUODENOSCOPY (EGD) WITH ESOPHAGEAL DILATION     "several times'   ESOPHAGOGASTRODUODENOSCOPY (EGD) WITH PROPOFOL N/A 08/19/2022   Procedure: ESOPHAGOGASTRODUODENOSCOPY (EGD) WITH PROPOFOL;  Surgeon: Ladene Artist, MD;  Location: [place];  Service: Gastroenterology;  Laterality: N/A;   HAND SURGERY Bilateral    "plastic knuckles"   LAPAROSCOPIC CHOLECYSTECTOMY  01/2006    MELANOMA EXCISION     "back; left arm"   NASAL SEPTUM SURGERY     PELVIC FRACTURE SURGERY  1964   "busted in 2 places"   RESECTION DISTAL CLAVICAL Right 05/13/2016   Procedure: DISTAL CLAVICLE EXCISION;  Surgeon: Ninetta Lights, MD;  Location: [place];  Service: Orthopedics;  Laterality: Right;   SHOULDER ARTHROSCOPY WITH ROTATOR CUFF REPAIR AND SUBACROMIAL DECOMPRESSION Right 05/13/2016   Procedure: RIGHT SHOULDER SCOPE DEBRIDEMENT, ACROMIOPLASTY, ROTATOR CUFF REPAIR; RELEASE BICEPS TENDON AND DEBRIDEMENT LABRUM;  Surgeon: Ninetta Lights, MD;  Location: [place];  Service: Orthopedics;  Laterality: Right;   TIBIA FRACTURE SURGERY Left 2006   "put a steel rod in it"    Family History  Problem Relation Age of Onset   Heart disease Mother    Heart disease Father    Cancer Maternal Grandmother     Social History:  reports that he quit smoking about 16 years ago. His smoking use included cigarettes. He has a 46.00 pack-year smoking history. He has never used smokeless tobacco. He reports current alcohol use of about 9.0 standard drinks of alcohol per week. He reports that he does not use drugs.  Allergies: No Known Allergies  Medications: I have reviewed the patient's current medications.  Results for orders placed or performed during the  hospital encounter of 08/18/22 (from the past 48 hour(s))  CBC     Status: Abnormal   Collection Time: 08/20/22  7:09 AM  Result Value Ref Range   WBC 10.3 4.0 - 10.5 K/uL   RBC 2.20 (L) 4.22 - 5.81 MIL/uL   Hemoglobin 7.2 (L) 13.0 - 17.0 g/dL   HCT 20.4 (L) 39.0 - 52.0 %   MCV 92.7 80.0 - 100.0 fL   MCH 32.7 26.0 - 34.0 pg   MCHC 35.3 30.0 - 36.0 g/dL   RDW 14.9 11.5 - 15.5 %   Platelets 197 150 - 400 K/uL   nRBC 0.0 0.0 - 0.2 %    Comment: Performed at [facility], [place] [address] [place], [place] [address]  Hemoglobin A1c     Status: Abnormal   Collection Time: 08/20/22  7:09 AM  Result Value Ref  Range   Hgb A1c MFr Bld 6.8 (H) 4.8 - 5.6 %    Comment: (NOTE) Pre diabetes:          5.7%-6.4%  Diabetes:              >6.4%  Glycemic control for   <7.0% adults with diabetes    Mean Plasma Glucose 148.46 mg/dL    Comment: Performed at [facility] [address] [place], [place] [address]  Glucose, capillary     Status: Abnormal   Collection Time: 08/20/22  1:22 PM  Result Value Ref Range   Glucose-Capillary 180 (H) 70 - 99 mg/dL    Comment: Glucose reference range applies only to samples taken after fasting for at least 8 hours.  Glucose, capillary     Status: Abnormal   Collection Time: 08/20/22  6:18 PM  Result Value Ref Range   Glucose-Capillary 106 (H) 70 - 99 mg/dL    Comment: Glucose reference range applies only to samples taken after fasting for at least 8 hours.  Glucose, capillary     Status: Abnormal   Collection Time: 08/20/22  9:44 PM  Result Value Ref Range   Glucose-Capillary 147 (H) 70 - 99 mg/dL    Comment: Glucose reference range applies only to samples taken after fasting for at least 8 hours.  CBC     Status: Abnormal   Collection Time: 08/21/22  1:23 AM  Result Value Ref Range   WBC 8.6 4.0 - 10.5 K/uL   RBC 2.17 (L) 4.22 - 5.81 MIL/uL   Hemoglobin 7.1 (L) 13.0 - 17.0 g/dL   HCT 20.6 (L) 39.0 - 52.0 %   MCV 94.9 80.0 - 100.0 fL   MCH 32.7 26.0 - 34.0 pg   MCHC 34.5 30.0 - 36.0 g/dL   RDW 14.8 11.5 - 15.5 %   Platelets 200 150 - 400 K/uL   nRBC 0.0 0.0 - 0.2 %    Comment: Performed at [facility], [place] [address], [place], [place] [address]  Basic metabolic panel     Status: Abnormal   Collection Time: 08/21/22  1:23 AM  Result Value Ref Range   Sodium 138 135 - 145 mmol/L    Comment: DELTA CHECK NOTED   Potassium 3.1 (L) 3.5 - 5.1 mmol/L   Chloride 111 98 - 111 mmol/L   CO2 20 (L) 22 - 32 mmol/L   Glucose, Bld 146 (H) 70 - 99 mg/dL    Comment: Glucose reference range applies only to samples taken after fasting for at least 8  hours.  BUN 30 (H) 8 - 23 mg/dL   Creatinine, Ser 1.21 0.61 - 1.24 mg/dL    Comment: DELTA CHECK NOTED   Calcium 7.7 (L) 8.9 - 10.3 mg/dL   GFR, Estimated >60 >60 mL/min    Comment: (NOTE) Calculated using the CKD-EPI Creatinine Equation (2021)    Anion gap 7 5 - 15    Comment: Performed at [facility] [address], [place], [place] [address]  Magnesium     Status: Abnormal   Collection Time: 08/21/22  1:23 AM  Result Value Ref Range   Magnesium 1.4 (L) 1.7 - 2.4 mg/dL    Comment: Performed at [facility] [address] [place], [place] [address]  Glucose, capillary     Status: Abnormal   Collection Time: 08/21/22  8:35 AM  Result Value Ref Range   Glucose-Capillary 150 (H) 70 - 99 mg/dL    Comment: Glucose reference range applies only to samples taken after fasting for at least 8 hours.  Prepare RBC (crossmatch)     Status: None   Collection Time: 08/21/22 10:38 AM  Result Value Ref Range   Order Confirmation      ORDERS RECEIVED TO CROSSMATCH Performed at [facility], [address], [place], [place] [address]   Glucose, capillary     Status: Abnormal   Collection Time: 08/21/22 11:54 AM  Result Value Ref Range   Glucose-Capillary 111 (H) 70 - 99 mg/dL    Comment: Glucose reference range applies only to samples taken after fasting for at least 8 hours.  Glucose, capillary     Status: Abnormal   Collection Time: 08/21/22  5:40 PM  Result Value Ref Range   Glucose-Capillary 167 (H) 70 - 99 mg/dL    Comment: Glucose reference range applies only to samples taken after fasting for at least 8 hours.    CT ANKLE LEFT WO CONTRAST  Result Date: 08/21/2022 CLINICAL DATA:  Ankle trauma EXAM: CT OF THE LEFT ANKLE WITHOUT CONTRAST TECHNIQUE: Multidetector CT imaging of the left ankle was performed according to the standard protocol. Multiplanar CT image reconstructions were also generated. RADIATION DOSE REDUCTION: This exam was performed according to the  departmental dose-optimization program which includes automated exposure control, adjustment of the mA and/or kV according to patient size and/or use of iterative reconstruction technique. COMPARISON:  X-ray 08/21/2022 FINDINGS: Bones/Joint/Cartilage Acute obliquely oriented fracture through the distal fibular metaphysis with 6 mm of lateral displacement. No additional fractures. No dislocation. Ankle mortise remains congruent. Plantar calcaneal spur. Ligaments Suboptimally assessed by CT. Muscles and Tendons No acute musculotendinous abnormality by CT. Soft tissues Soft tissue swelling over the lateral malleolus. IMPRESSION: Acute obliquely oriented mildly displaced fracture through the distal fibular metaphysis. Electronically Signed   By: Davina Poke D.O.   On: 08/21/2022 17:57   DG Foot Complete Left  Result Date: 08/21/2022 CLINICAL DATA:  Left foot pain.  Left leg fracture. EXAM: LEFT FOOT - COMPLETE 3+ VIEW COMPARISON:  None Available. FINDINGS: Oblique fracture of distal fibula is seen. No evidence of fracture or dislocation involving the foot. 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 Prominent plantar calcaneal bone spur also noted. IMPRESSION: Oblique fracture of distal fibula.  No foot fracture identified. 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 Electronically Signed   By: Marlaine Hind M.D.   On: 08/21/2022 16:26   DG Tibia/Fibula Left Port  Result Date: 08/21/2022 CLINICAL DATA:  Follow-up fracture EXAM: PORTABLE LEFT TIBIA AND  FIBULA - 2 VIEW COMPARISON:  Left ankle x-ray same day. FINDINGS: Again seen is an oblique fracture of the distal fibular diaphysis. Fracture fragments are distracted 4 mm, unchanged. There is no evidence for dislocation. No additional fractures are seen. There are mild degenerative changes of the knee. Peripheral vascular calcifications are present. Femoral intramedullary nail is partially visualized. IMPRESSION: Oblique fracture of the distal fibula.  Electronically Signed   By: Ronney Asters M.D.   On: 08/21/2022 16:25   DG Ankle Complete Left  Result Date: 08/21/2022 CLINICAL DATA:  509326 pain EXAM: LEFT ANKLE COMPLETE - 3+ VIEW COMPARISON:  None Available. FINDINGS: There is a mildly displaced oblique fracture of the distal fibula. Minimal anterior displacement of a distal fragment with minimal lateral displacement of the distal fragment. There is adjacent soft tissue edema. Ankle mortise appears preserved. No additional fracture is noted within the limitations of the exam. Enthesopathic changes of the plantar calcaneus. IMPRESSION: Minimally displaced oblique fracture of the distal fibula. Electronically Signed   By: Valentino Saxon M.D.   On: 08/21/2022 11:15    Review of Systems  Psychiatric/Behavioral:  Positive for confusion.    Blood pressure (!) 106/51, pulse 69, temperature 98.3 F (36.8 C), temperature source Oral, resp. rate 18, height '6\' 1"'$  (1.854 m), weight 81.6 kg, SpO2 99 %. Physical Exam Constitutional:      Appearance: Normal appearance.  Musculoskeletal:        General: Swelling (left ankle lateral side) and tenderness (lateral aspect of ankle) present.     Comments: Significant swelling and ecchymosis noted over the lateral aspect of the ankle.  Tenderness with palpation over lateral malleolus.  He has some difficulty with ankle dorsiflexion and plantarflexion limited due to pain.  He is able to move all toes with no pain.  Neurovascularly intact in his left lower extremity.  Neurological:     Mental Status: He is alert.     Assessment/Plan: Left ankle distal fibula fracture: Patient has a distal fibula fracture.  Currently the fracture is mildly displaced.  He was placed in a posterior and U-splint.  We are going to get him into a cam boot.  He needs to remain in this.  He is going to be strict nonweightbearing at this time.  Hopefully we can continue to manage this nonsurgically.  At the current time that is the  plan.  We will need him to follow-up with [place] outpatient so we can get follow-up.  All questions encouraged and answered for the patient today. Plan was discussed with Dr. Kathaleen Bury.   Nettie Elm [facility] [phone] 08/21/2022, 6:04 PM

## 2022-08-21 NOTE — Progress Notes (Signed)
Orthopedic Tech Progress Note Patient Details:  [name] Mar 11, 1946 [id number] Patient stated he did not want the CAM Walker. Nurse was made aware  Ortho Devices Type of Ortho Device: CAM walker Ortho Device/Splint Location: LLE Ortho Device/Splint Interventions: Ordered   Post Interventions Patient Tolerated: Refused intervention  [name] [name] 08/21/2022, 4:13 PM

## 2022-08-21 NOTE — Plan of Care (Signed)
?  Problem: Clinical Measurements: ?Goal: Will remain free from infection ?Outcome: Progressing ?Goal: Diagnostic test results will improve ?Outcome: Progressing ?Goal: Respiratory complications will improve ?Outcome: Progressing ?  ?

## 2022-08-21 NOTE — Progress Notes (Signed)
Orthopedic Tech Progress Note Patient Details:  [name] 1946/01/16 [id number]  Ortho Devices Type of Ortho Device: CAM walker Ortho Device/Splint Location: LUE Ortho Device/Splint Interventions: Application   Post Interventions Patient Tolerated: Well  [name] 08/21/2022, 6:34 PM

## 2022-08-21 NOTE — Progress Notes (Signed)
Mr [name] refusing CAM Gilford Rile, it is too expensive and too heavy. Notified patient of his ankle being broken, he could potentially loose his foot. Still refusing. Dr Kathaleen Bury notified. Patient agreeing to a splint.

## 2022-08-21 NOTE — Progress Notes (Signed)
NAME:  [name], MRN:  [id number], DOB:  June 12, 1946, LOS: 2 ADMISSION DATE:  08/18/2022  Subjective  Patient evaluated at bedside this AM accompanied by his son, [name]. Mentions that he continues to have pain in his upper abdomen. Still does not have an appetite, only able to really drink fluids at this time. Also mentions continued ankle pain after the fall he experienced at home. We discussed future decisions, including re-starting home Eliquis vs IVC filter vs leave off. Our team had a long discussion with Mr. Malecha and his son regarding his past medical history and the extensive care he is getting by a multitude of providers. They both endorse some frustration over having to see so many providers, which makes his medical care very confusing. Mr. Kost son also mentioned that he has had previous concerns over aspiration. Mr. Tamargo currently lives alone, and wishes to continue living independently. We discussed having palliative care come to see him to further discuss some of these topics.  Objective   Blood pressure (!) 114/42, pulse 73, temperature 99.9 F (37.7 C), temperature source Oral, resp. rate 20, height '6\' 1"'$  (1.854 m), weight 81.6 kg, SpO2 98 %.     Intake/Output Summary (Last 24 hours) at 08/21/2022 1314 Last data filed at 08/20/2022 2200 Gross per 24 hour  Intake 240 ml  Output 750 ml  Net -510 ml   Filed Weights   08/19/22 0942  Weight: 81.6 kg   Physical Exam: General: Chronically ill-appearing person resting in bed in no acute distress CV: Regular rate, rhythm. No murmurs appreciated. Warm extremities.  Pulm: Normal work of breathing on room air. Abdomen: Soft, non-distended. Mild tenderness to palpation over epigastric region. No overlying skin changes appreciated. MSK: Normal bulk, tone. No peripheral edema appreciated. No erythema on bilateral lower extremities.Tenderness to palpation over Achilles insertion point on the calcaneus on L foot.  Tenderness to palpation over proximal portion of lateral malleolus on L. Passive and active range of motion mildly limited by pain.   Neuro: Awake, alert, conversing appropriately. Grossly non-focal.   Labs       Latest Ref Rng & Units 08/21/2022    1:23 AM 08/20/2022    7:09 AM 08/19/2022    4:00 AM  CBC  WBC 4.0 - 10.5 K/uL 8.6  10.3  14.4   Hemoglobin 13.0 - 17.0 g/dL 7.1  7.2  9.1   Hematocrit 39.0 - 52.0 % 20.6  20.4  26.1   Platelets 150 - 400 K/uL 200  197  220       Latest Ref Rng & Units 08/21/2022    1:23 AM 08/19/2022    4:00 AM 08/18/2022    3:08 PM  BMP  Glucose 70 - 99 mg/dL 146  216  356   BUN 8 - 23 mg/dL 30  116  >130   Creatinine 0.61 - 1.24 mg/dL 1.21  2.25  2.80   Sodium 135 - 145 mmol/L 138  131  126   Potassium 3.5 - 5.1 mmol/L 3.1  3.6  3.7   Chloride 98 - 111 mmol/L 111  99  89   CO2 22 - 32 mmol/L 20  19    Calcium 8.9 - 10.3 mg/dL 7.7  7.7     Summary   [name] is 76yo person with rheumatoid arthritis on chronic steroids, erosive esophagitis with recurrent esophageal strictures, duodenal ulcer disease, type II diabetes mellitus admitted 8/23 with acute upper GI bleed, now hemodynamically stable  but unable to tolerate PO.   Assessment & Plan:  Principal Problem:   UGIB (upper gastrointestinal bleed) Active Problems:   Hematemesis with nausea   Upper GI bleed   Pressure injury of skin  #Acute upper GI bleed #LA Grade D reflux esophagitis #Duodenal ulcer disease This morning, Hgb stable, 7.1<7.2. BUN significantly down 30<116. Patient has not had any further episodes of hematemesis. Yesterday diet was progressed to full liquid diet, but patient has not been able to eat anything as of now due to his pain. In addition, there was a small foci of atypical cells, likely representing inflammation. However, he will require f/u EGD in 8 weeks with his outpatient GI physician. He continues to be profoundly weak, we will transfuse another unit of blood  today. Will touch base with GI regarding anticoagulation as well. Plan to start carafate for symptom relief. Continuing fluids for now until his PO intake improves. - Appreciate GI recommendations, discuss re-starting Eliquis - Continue IV pantoprazole '40mg'$  every 12 hours - Start carafate 1g four times daily AC/HS - Continue normal saline infusion 100cc/hr, assess further need in AM - Transfuse 1u pRBC, f/u post-transfusion H/H - Continue full liquid diet today - Daily CBC - F/u outpatient GI  #Type II diabetes mellitus Sugars right now at goal. Given he is not having good PO intake, will hold off any long-acting insulin and continue with sliding scale. - SSI  #Hypokalemia #Hypomagnesemia Most likely 2/2 poor po intake. We will replete these intravenously, as he is still having some difficulty taking PO.  - Follow-up repeat BMP, Mg in AM  #Rheumatoid arthritis Prednisone was being held in setting of acute GI bleed. BP, sugars, electrolytes remained stable, no signs of adrenal insufficiency. Will re-start his home prednisone. - Prednisone 7.'5mg'$  daily  #L ankle pain Patient reports pain initially occurred after a fall in his house. This has not been imaged since arrival. Continues to endorse tenderness to palpation, specifically proximal to lateral malleolus.  - Follow-up L foot XR - Voltaren gel   #Aspiration risk During our visit today, Mr. Veltre son mentioned previously having concerns for aspiration. Since we have been seeing Mr. Loudermilk in the hospital, he does appear to be more deconditioned and weak. We will obtain swallowing eval today. - Pending SLP eval - Aspiration precautions  #Physical deconditioning  Mr. Lewelling currently lives alone and would like to stay independent. However, given his significant chronic medical diseases and multiple hospitalizations over the last year this may no longer be possible. We have reached out to our palliative team to help discuss  these issues with him and his family. - Palliative consult pending - PT/OT  Best practice:  DIET: FLD IVF: NS DVT PPX: na - risk of bleed, recent DVT BOWEL: na CODE: FULL FAM COM: son, [name], at [place], MD Internal Medicine Resident PGY-3 PAGER: [phone] 08/21/2022 1:14 PM  If after hours (below), please contact on-call pager: [phone] 5PM-7AM Monday-Friday 1PM-7AM Saturday-Sunday

## 2022-08-21 NOTE — TOC Initial Note (Signed)
Transition of Care [facility]) - Initial/Assessment Note    Patient Details  Name: [name] MRN: [id number] Date of Birth: 1946-02-10  Transition of [facility]) CM/SW Contact:    [address], [address] Phone Number: 08/21/2022, 10:31 AM  Clinical Narrative:                 CSW received consult for possible SNF placement at time of discharge. CSW spoke with patient's son [name]. [name] reported that patient is currently unable to care for himself at their home given his current physical needs and fall risk. [name] expressed understanding of PT recommendation and is agreeable to SNF placement at time of discharge. [name] reports preference for [facility], if not possible then he would like [facility]. CSW discussed insurance authorization process and will provide Medicare SNF ratings list. CSW will send out referrals for review and provide bed offers as available.   Skilled Nursing Rehab Facilities-   [email or web address]   Ratings out of 5 stars (the highest)   Name Address  Phone # [place] [facility] Overall  [facility] [address] [place] '5 5 2 4  '$ [facility]  [address] [place], [place] [phone] '4 2 5 5  '$ [facility] [place], [place] '1 1 1 1  '$ [place], [place] '2 2 4 4  '$ [facility] [address] [place] '1 1 2 1  '$ [place] [facility] [place] '3 2 4 4  '$  [facility] [address], [place] '5 1 3 3  '$ [facility] [address], [place] '5 2 3 4  '$ [address] ([facility]) [place], [place] [phone] '4 1 2 1  '$ [facility] [phone] [address], [place] [phone] '4 1 1 1  '$ [facility] [address] [facility] [phone] '3 1 2 1  '$ [facility] ([place]) [place]. [place], [place]  [phone] '4 1 1 1  '$ Dustin Flock [address] [place]i [place] [phone] '4 2 4 4          '$ [place] [address] [place] '4 1 3 2  '$ [facility] [place] [address] [place] '3 1 5 4  '$ [facility], [place] [address], [place] [phone] '1 1 1 1  '$ [facility] [address], [place] [phone] '2 2 3 3          '$ [place] (no [facility]) [place] [facility] [address], [place] [phone] '5 5 5 5  '$ [facility] (No [employer]) [address], [place] '4 1 4 3  '$ [facility] (No [employer]) [place], [place] '5 5 5 5  '$ [facility] [address] [facility] [phone] '2 3 5 5  '$ [facility] [place] [address], [place] '1 1 2 1  '$ [facility] [address] '3 1 1 1  '$ [place], [place] '5 2 4 5  '$ [facility]  [address] [place] '2 2 1 1  '$ [facility] [address], [place] '3 2 1 1  '$ [facility] [place], [place] '2 2 2 2          '$ [facility] [address], [place] '1 1 1 1  '$ [place]  Dr, Ellender Hose  [phone] '2 4 2 2  '$ [facility] [place] [address], [place] [phone] '4 2 3 3  '$ [place] [address] [place] '2 1 1 1  '$ [place] (No Humana) [address], [place] '2 2 3 3  '$ [facility] [address], [place] [phone] '2 1 1 1          '$ [facility] Solis, [place] '5 4 5 5  '$ [facility])  [facility] [address], [place] '2 1 2 1  '$ [facility]) [place] [address], [place] '3 1 4 3  '$ [facility] [address] [place] '3 3 4 4  '$ [address] [place],  [place] '2 3 1 1  '$ [address] Rehab [facility]) [address] [place] [phone] '2 1 4 3           '$ Patient Goals and CMS Choice        Expected Discharge Plan and Services                                                Prior Living Arrangements/Services                       Activities of Daily Living      Permission Sought/Granted                  Emotional Assessment              Admission diagnosis:  Uremia [N19] Acute GI bleeding [K92.2] Hyperglycemia [R73.9] Generalized weakness [R53.1] AKI (acute kidney injury) ([place]) [N17.9] Nausea and vomiting in adult [R11.2] Leukocytosis, unspecified type [D72.829] Upper GI bleed [K92.2] Patient Active Problem List   Diagnosis Date Noted   Pressure injury of skin 08/20/2022   Upper GI bleed 08/19/2022   Hematemesis with nausea    UGIB (upper gastrointestinal bleed) 08/18/2022   Pancytopenia ([place])    Immunosuppression ([place])    ILD (interstitial lung disease) ([place])    Diarrhea    DIC (disseminated intravascular coagulation) ([place])    AKI (acute kidney injury) ([place])    COVID-19 01/26/2021   Rheumatoid arthritis ([place]) 07/22/2015   Mucositis due to chemotherapy 06/25/2015   Mucositis due to radiation therapy 06/25/2015   SBO (small bowel obstruction) ([place]) 05/29/2015   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   H/O drug therapy 05/12/2015   Abnormal mental state 04/22/2015   Fast heart beat 04/22/2015   Febrile 04/18/2015   Candida esophagitis ([place]) 04/16/2015   Can't get food down 04/16/2015   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   Fall 05/24/2014   Traumatic pneumothorax 05/23/2014   Diabetes mellitus, type 2 ([place]) 05/01/2014   Perirectal abscess 07/31/2013   Hemorrhoids 07/31/2013   Lentigines 11/13/2012   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   Atypical nevus 08/29/2012   Malignant melanoma in situ  ([place]) 08/29/2012   Polypharmacy 08/23/2012   Dyspnea 08/14/2012   Rheumatoid arthritis(714.0) 08/14/2012   HTN (hypertension) 08/14/2012   GERD (gastroesophageal reflux disease) 08/14/2012   Emphysema 08/14/2012   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   DU (duodenal ulcer) 10/15/2011   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   Esophageal stenosis 10/15/2011  PCP:  Elaina Pattee, MD Pharmacy:   [place], [place] [address] [place] [address] Phone: [phone] Fax: [phone]  [facility] [place], [place] - [place] AT [facility] [address] OF [place] [address] [place] [place] [address] Phone: [phone] Fax: [phone]  [facility] [place], [place] - [address] AT [place] [address] [place] [address] Phone: [phone] Fax: [phone]  [facility] [place] [id number] - [place], [place] - [address] [place] [address] Phone: [phone] Fax: [phone]     Social Determinants of Health (SDOH) Interventions    Readmission Risk Interventions     No data to display

## 2022-08-21 NOTE — Progress Notes (Signed)
Pt's current oral temp is 100.1 and pt is due to start a blood transfusion. Transfusion not started. No prn tylenol order available. Provider paged prior to starting transfusion to find out if a tylenol order will be placed and to make sure it's still ok to start blood transfusion. Waiting for new orders.

## 2022-08-21 NOTE — NC FL2 (Signed)
[place] LEVEL OF CARE SCREENING TOOL     IDENTIFICATION  Patient Name: [name] Birthdate: 30-Nov-1946 Sex: male Admission Date (Current Location): 08/18/2022  [place] and [place] Number:  Herbalist and Address:  [facility] [place], [place] [address], [place], [place] [address]      Provider Number: [id number]  Attending Physician Name and Address:  Angelica Pou, MD  Relative Name and Phone Number:  Kipling Graser [phone]    Current Level of Care: Hospital Recommended Level of Care: [place] Prior Approval Number:    Date Approved/Denied:   PASRR Number: [id number]  Discharge Plan: SNF    Current Diagnoses: Patient Active Problem List   Diagnosis Date Noted   Pressure injury of skin 08/20/2022   Upper GI bleed 08/19/2022   Hematemesis with nausea    UGIB (upper gastrointestinal bleed) 08/18/2022   Pancytopenia ([place])    Immunosuppression ([place])    ILD (interstitial lung disease) ([place])    Diarrhea    DIC (disseminated intravascular coagulation) (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   Fall 05/24/2014   Traumatic pneumothorax 05/23/2014   Diabetes mellitus, type 2 ([place]) 05/01/2014   Perirectal abscess 07/31/2013   Hemorrhoids 07/31/2013   Lentigines 11/13/2012   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   Atypical nevus 08/29/2012   Malignant melanoma in situ ([place])  08/29/2012   Polypharmacy 08/23/2012   Dyspnea 08/14/2012   Rheumatoid arthritis(714.0) 08/14/2012   HTN (hypertension) 08/14/2012   GERD (gastroesophageal reflux disease) 08/14/2012   Emphysema 08/14/2012   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   DU (duodenal ulcer) 10/15/2011   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   Esophageal stenosis 10/15/2011    Orientation RESPIRATION BLADDER Height & Weight     Self, Time  Normal Incontinent Weight: 180 lb (81.6 kg) Height:  '6\' 1"'$  (185.4 cm)  BEHAVIORAL SYMPTOMS/MOOD NEUROLOGICAL BOWEL NUTRITION STATUS      Incontinent Diet (See dc summary)  AMBULATORY STATUS COMMUNICATION OF NEEDS Skin   Limited Assist Verbally Normal                       Personal Care Assistance Level of Assistance  Bathing, Feeding, Dressing Bathing Assistance: Limited assistance Feeding assistance: Independent Dressing Assistance: Limited assistance     Functional Limitations Info  Sight, Speech, Hearing Sight Info: Adequate Hearing Info: Adequate Speech Info: Adequate    SPECIAL CARE FACTORS FREQUENCY  PT (By licensed PT), OT (By licensed OT)     PT Frequency: 3x week OT Frequency: 3x week            Contractures Contractures Info: Not present    Additional Factors Info  Code Status, Allergies, Insulin Sliding Scale Code Status Info: Full Allergies Info: NKA   Insulin Sliding Scale Info: insulin aspart (novoLOG) injection 0-15 Units 3x day with meals, insulin aspart (novoLOG) injection 0-5 Units at bedtime,  Current Medications (08/21/2022):  This is the current hospital active medication list Current Facility-Administered Medications  Medication Dose Route Frequency Provider Last Rate Last Admin   0.9 %  sodium chloride infusion (Manually program via Guardrails IV Fluids)   Intravenous Once Sanjuan Dame, MD       0.9 %  sodium chloride infusion   Intravenous Continuous Nooruddin, Saad, MD 100 mL/hr at 08/21/22 0653 New Bag at  08/21/22 0653   insulin aspart (novoLOG) injection 0-15 Units  0-15 Units Subcutaneous TID WC Angelica Pou, MD   2 Units at 08/21/22 0355   insulin aspart (novoLOG) injection 0-5 Units  0-5 Units Subcutaneous QHS Sanjuan Dame, MD       Derrill Memo ON 08/22/2022] pantoprazole (PROTONIX) injection 40 mg  40 mg Intravenous Q12H Lajean Saver, MD       potassium chloride 10 mEq in 100 mL IVPB  10 mEq Intravenous Q1 Hr x 4 Sanjuan Dame, MD 100 mL/hr at 08/21/22 0932 10 mEq at 08/21/22 0932   [START ON 08/22/2022] predniSONE (DELTASONE) tablet 7.5 mg  7.5 mg Oral Q breakfast Iona Coach, MD       sucralfate (CARAFATE) 1 GM/10ML suspension 1 g  1 g Oral TID WC & HS Sanjuan Dame, MD         Discharge Medications: Please see discharge summary for a list of discharge medications.  Relevant Imaging Results:  Relevant Lab Results:   Additional Information    Khilynn Borntreger B Orlanda Frankum, LCSWA

## 2022-08-21 NOTE — Progress Notes (Signed)
Order put in for Tylenol to be given prior to blood administration.

## 2022-08-22 DIAGNOSIS — K2100 Gastro-esophageal reflux disease with esophagitis, without bleeding: Secondary | ICD-10-CM

## 2022-08-22 DIAGNOSIS — S82402A Unspecified fracture of shaft of left fibula, initial encounter for closed fracture: Secondary | ICD-10-CM | POA: Diagnosis present

## 2022-08-22 LAB — GLUCOSE, CAPILLARY
Glucose-Capillary: 98 mg/dL (ref 70–99)
Glucose-Capillary: 128 mg/dL — ABNORMAL HIGH (ref 70–99)
Glucose-Capillary: 173 mg/dL — ABNORMAL HIGH (ref 70–99)
Glucose-Capillary: 227 mg/dL — ABNORMAL HIGH (ref 70–99)

## 2022-08-22 LAB — CBC
HCT: 23.9 % — ABNORMAL LOW (ref 39.0–52.0)
Hemoglobin: 8.1 g/dL — ABNORMAL LOW (ref 13.0–17.0)
MCH: 32 pg (ref 26.0–34.0)
MCHC: 33.9 g/dL (ref 30.0–36.0)
MCV: 94.5 fL (ref 80.0–100.0)
Platelets: 211 10*3/uL (ref 150–400)
RBC: 2.53 MIL/uL — ABNORMAL LOW (ref 4.22–5.81)
RDW: 14.7 % (ref 11.5–15.5)
WBC: 9 10*3/uL (ref 4.0–10.5)
nRBC: 0 % (ref 0.0–0.2)

## 2022-08-22 LAB — BPAM RBC
Blood Product Expiration Date: 202309082359
Blood Product Expiration Date: 202309222359
ISSUE DATE / TIME: 202308231618
ISSUE DATE / TIME: 202308261316
Unit Type and Rh: 6200
Unit Type and Rh: 6200

## 2022-08-22 LAB — MAGNESIUM: Magnesium: 1.5 mg/dL — ABNORMAL LOW (ref 1.7–2.4)

## 2022-08-22 LAB — TYPE AND SCREEN
ABO/RH(D): A POS
Antibody Screen: NEGATIVE
Unit division: 0
Unit division: 0

## 2022-08-22 LAB — BASIC METABOLIC PANEL
Anion gap: 6 (ref 5–15)
BUN: 18 mg/dL (ref 8–23)
CO2: 20 mmol/L — ABNORMAL LOW (ref 22–32)
Calcium: 7.4 mg/dL — ABNORMAL LOW (ref 8.9–10.3)
Chloride: 111 mmol/L (ref 98–111)
Creatinine, Ser: 1.04 mg/dL (ref 0.61–1.24)
GFR, Estimated: >60 mL/min (ref >60)
Glucose, Bld: 101 mg/dL — ABNORMAL HIGH (ref 70–99)
Potassium: 3.2 mmol/L — ABNORMAL LOW (ref 3.5–5.1)
Sodium: 137 mmol/L (ref 135–145)

## 2022-08-22 MED ORDER — POTASSIUM CHLORIDE CRYS ER 20 MEQ PO TBCR
40.0000 meq | EXTENDED_RELEASE_TABLET | Freq: Two times a day (BID) | ORAL | Status: AC
  Administered 2022-08-22: 40 meq via ORAL
  Filled 2022-08-22: qty 2

## 2022-08-22 MED ORDER — MAGNESIUM SULFATE 2 GM/50ML IV SOLN
2.0000 g | Freq: Once | INTRAVENOUS | Status: AC
  Administered 2022-08-22: 2 g via INTRAVENOUS
  Filled 2022-08-22: qty 50

## 2022-08-22 MED ORDER — POTASSIUM CHLORIDE 10 MEQ/100ML IV SOLN
10.0000 meq | INTRAVENOUS | Status: AC
  Administered 2022-08-22 (×4): 10 meq via INTRAVENOUS
  Filled 2022-08-22 (×5): qty 100

## 2022-08-22 NOTE — Progress Notes (Signed)
SLP Cancellation Note  Patient Details Name: [name] MRN: [id number] DOB: [birth year]   Cancelled treatment:       Reason Eval/Treat Not Completed: Patient declined, no reason specified 08/22/2022. SLP spent time discussing dysphagia with patient and potential benefit to objectively evaluating swallow function (via Modified barium swallow study). Patient did report h/o times of aspiration but that he "got used to it....mentally and physically". His main concern right now is the ulcers in his esophagus that "dictate what I can eat". SLP discussed that if aspiration was seen on a modified barium swallow test, the potential benefit would be if we found a strategy (chin tuck, etc) that could reduce or prevent aspiration as well as to determine if swallow function improved with different consistencies/viscosities of solids and liquids. Patient does not wish to have additional testing added to his current plan of care at this time. SLP sent secure Epic message to MD's involved in his care and plans to f/u next date, likely after palliative has met with patient.   Sonia Baller, MA, CCC-SLP Speech Therapy

## 2022-08-22 NOTE — Progress Notes (Signed)
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 year old male currently admitted on the IMTS HD#3 for acute GI bleed.  Overnight Events: NOE   Pt was seen at bedside in the AM today. He states he is feeling optimistic about his prognosis. Upon discharge, he states he will be living with his ex-wife, which he has done before. He feels better than how he felt upon admission. He was complaining of abdominal pain in the epigastric region, but states he feels like it's his ulcer pain. He had not taken his medication at time of encounter.   Objective:  Vital signs in last 24 hours: Vitals:   08/21/22 1950 08/21/22 1952 08/22/22 0628 08/22/22 0825  BP:  (!) 101/47 (!) 131/56 122/60  Pulse: 66 66 72 73  Resp: '18  16 18  '$ Temp: (!) 97.5 F (36.4 C)  98.8 F (37.1 C) 99.1 F (37.3 C)  TempSrc: Oral  Oral Oral  SpO2: 98% 99% 98% 99%  Weight:      Height:       Supplemental O2: Room Air 99% on RA   Physical Exam:  Constitutional: Chronically ill-appearing male resting in bed in no acute distress Cardiovascular: RRR, no murmurs, rubs or gallops Pulmonary/Chest: normal work of breathing on room air, lungs clear to auscultation bilaterally Abdominal: soft, non-tender, non-distended Skin: warm and dry Extremities: upper/lower extremity pulses 2+, no lower extremity edema present, L ankle in boot per orthopedics   Filed Weights   08/19/22 0942  Weight: 81.6 kg     Intake/Output Summary (Last 24 hours) at 08/22/2022 1016 Last data filed at 08/22/2022 0700 Gross per 24 hour  Intake 5999.4 ml  Output 1200 ml  Net 4799.4 ml   Net IO Since Admission: 4,913.31 mL [08/22/22 1016]  Pertinent Labs:    Latest Ref Rng & Units 08/22/2022   12:49 AM 08/21/2022   10:14 PM 08/21/2022    1:23 AM  CBC  WBC 4.0 - 10.5 K/uL 9.0   8.6   Hemoglobin 13.0 - 17.0 g/dL 8.1  7.8  7.1   Hematocrit 39.0 - 52.0 % 23.9  22.4  20.6   Platelets 150 - 400 K/uL 211   200         Latest Ref Rng & Units 08/22/2022   12:49 AM 08/21/2022    1:23 AM 08/19/2022    4:00 AM  CMP  Glucose 70 - 99 mg/dL 101  146  216   BUN 8 - 23 mg/dL 18  30  116   Creatinine 0.61 - 1.24 mg/dL 1.04  1.21  2.25   Sodium 135 - 145 mmol/L 137  138  131   Potassium 3.5 - 5.1 mmol/L 3.2  3.1  3.6   Chloride 98 - 111 mmol/L 111  111  99   CO2 22 - 32 mmol/L '20  20  19   '$ Calcium 8.9 - 10.3 mg/dL 7.4  7.7  7.7     Imaging: CT ANKLE LEFT WO CONTRAST  Result Date: 08/21/2022 CLINICAL DATA:  Ankle trauma EXAM: CT OF THE LEFT ANKLE WITHOUT CONTRAST TECHNIQUE: Multidetector CT imaging of the left ankle was performed according to the standard protocol. Multiplanar CT image reconstructions were also generated. RADIATION DOSE REDUCTION: This exam was performed according to the departmental dose-optimization program which includes automated exposure control, adjustment of the mA and/or kV according to  patient size and/or use of iterative reconstruction technique. COMPARISON:  X-ray 08/21/2022 FINDINGS: Bones/Joint/Cartilage Acute obliquely oriented fracture through the distal fibular metaphysis with 6 mm of lateral displacement. No additional fractures. No dislocation. Ankle mortise remains congruent. Plantar calcaneal spur. Ligaments Suboptimally assessed by CT. Muscles and Tendons No acute musculotendinous abnormality by CT. Soft tissues Soft tissue swelling over the lateral malleolus. IMPRESSION: Acute obliquely oriented mildly displaced fracture through the distal fibular metaphysis. Electronically Signed   By: Davina Poke D.O.   On: 08/21/2022 17:57   DG Foot Complete Left  Result Date: 08/21/2022 CLINICAL DATA:  Left foot pain.  Left leg fracture. EXAM: LEFT FOOT - COMPLETE 3+ VIEW COMPARISON:  None Available. FINDINGS: Oblique fracture of distal fibula is seen. No evidence of fracture or dislocation involving the foot. 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 Prominent plantar  calcaneal bone spur also noted. IMPRESSION: Oblique fracture of distal fibula.  No foot fracture identified. 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 Electronically Signed   By: Marlaine Hind M.D.   On: 08/21/2022 16:26   DG Tibia/Fibula Left Port  Result Date: 08/21/2022 CLINICAL DATA:  Follow-up fracture EXAM: PORTABLE LEFT TIBIA AND FIBULA - 2 VIEW COMPARISON:  Left ankle x-ray same day. FINDINGS: Again seen is an oblique fracture of the distal fibular diaphysis. Fracture fragments are distracted 4 mm, unchanged. There is no evidence for dislocation. No additional fractures are seen. There are mild degenerative changes of the knee. Peripheral vascular calcifications are present. Femoral intramedullary nail is partially visualized. IMPRESSION: Oblique fracture of the distal fibula. Electronically Signed   By: Ronney Asters M.D.   On: 08/21/2022 16:25   DG Ankle Complete Left  Result Date: 08/21/2022 CLINICAL DATA:  [id number] pain EXAM: LEFT ANKLE COMPLETE - 3+ VIEW COMPARISON:  None Available. FINDINGS: There is a mildly displaced oblique fracture of the distal fibula. Minimal anterior displacement of a distal fragment with minimal lateral displacement of the distal fragment. There is adjacent soft tissue edema. Ankle mortise appears preserved. No additional fracture is noted within the limitations of the exam. Enthesopathic changes of the plantar calcaneus. IMPRESSION: Minimally displaced oblique fracture of the distal fibula. Electronically Signed   By: Valentino Saxon M.D.   On: 08/21/2022 11:15    Assessment/Plan:   Principal Problem:   UGIB (upper gastrointestinal bleed) Active Problems:   Hematemesis with nausea   Upper GI bleed   Pressure injury of skin   Patient Summary: APOLONIO CUTTING is a 76 y.o. with a pertinent PMH of rheumatoid arthritis on chronic steroids, erosive esophagitis with recurrent esophageal strictures, duodenal ulcer disease, Type II Diabetes Mellitus admitted on 8/23,  who presented with hematemesis and admitted for acute GI bleed. Currently hemodynamically stable.     #Acute Upper GI Bleed #LA Grade D Reflux Esophagitis  #Duodenal Ulcer Disease Hemoglobin this morning around midnight was 8.1, which has improved from 7.8 yesterday. Yesterday he received 1u pRBC. We have transitioned him to a clear liquid diet however he states he has still not been able to eat anything PO, as well as lack of appetite. Pt stated he was feeling some abdominal pain in the epigastric region this morning, however had not taken his medication before encounter. He was started on Carafate yesterday, and pt states he has used this previously and has given him relief previously. EGD biopsy preliminary path report showed inflammation and necrotic debris with small focus of aytpical cells. At this point, GI suspects that the  atypical cells are secondary to inflammation. Pallitative care attempted to discuss goals of care, however patient wasn't in the mood to discuss. They will continue to follow.  Plan:  - Appreciate GI recommendations - Discussed restarting Eliquis with GI, who recommended due to pt's past history of recurrent DVT/PE, eliquis should be restarted tomorrow as the benefits outweigh the risks.  - Palliative care help appreciated - Continue IV Pantroprazole '40mg'$  Q12H - Continue Carafate 1g Q4H AC/HS - Continue normal saline infusion  - Continue full liquid diet -Daily CBC -F/U Outpatient GI   #Type II Diabetes Mellitus Sugars are currently still at goal. Pt still has not had good PO intake, so will only continue with Sliding Scale Insulin  #Hypokalemia #Hypomagnesemia Pt's potassium level was 3.2. at midnight, and Mg was 1.4. Both slightly decreased. Awaiting results of most recent BMP. Most likely due to poor PO intake.   Plan:  - IV K and IV Mg for repletion if necessary  - Daily BMP + Mg  #Rheumatoid Arthritis  Prednisone was initially held in the setting of  acute GI bleed.   - Prednisone 7.'5mg'$  daily   #Non-Displaced Left Weber B Ankle Fracture (Distal Fibula) post fall at home 2 days ago  Yesterday pt was complaining of ankle pain. X-Ray was obtained which revealed oblique fracture of distal fibula, most likely due to falls patient had that led to his admission. CT was reviewed by ortho who said they will need weightbearing films prior to ruling out surgery. Pt has been placed in CAM boot.   Plan:  - Pain control with Oxycodone '5mg'$  - Continue use of CAM boot  - Outpatient ortho follow up ([phone])   IVF: NS,100cc/hr VTE: None Code: Full   Dispo: Anticipated discharge in less than two midnights.  Drucie Opitz, MD PGY-1 Internal Medicine Resident Pager Number [phone] Please contact the on call pager after 5 pm and on weekends at [phone].

## 2022-08-22 NOTE — Progress Notes (Addendum)
Subjective: Patient reports pain as mild.  He feels boot is fitting him well and has no complaints. Denies numbness/tingling.   Objective:   VITALS:  Temp:  [97.5 F (36.4 C)-100.1 F (37.8 C)] 97.5 F (36.4 C) (08/26 1950) Pulse Rate:  [66-79] 66 (08/26 1952) Resp:  [16-20] 18 (08/26 1950) BP: (98-116)/(42-53) 101/47 (08/26 1952) SpO2:  [96 %-99 %] 99 % (08/26 1952)  Gen: AAOx3, NAD Comfortable at rest  Left Lower Extremity: Skin intact TTP over distal fibula HF/KE/KF/ADF/APF/EHL 5/5 SILT throughout DP, PT 2+ to palp CR < 2s    LABS Recent Labs    08/20/22 0709 08/21/22 0123 08/21/22 2214 08/22/22 0049  HGB 7.2* 7.1* 7.8* 8.1*  WBC 10.3 8.6  --  9.0  PLT 197 200  --  211   Recent Labs    08/21/22 0123 08/22/22 0049  NA 138 137  K 3.1* 3.2*  CL 111 111  CO2 20* 20*  BUN 30* 18  CREATININE 1.21 1.04  GLUCOSE 146* 101*   No results for input(s): "LABPT", "INR" in the last 72 hours.   Assessment/Plan: Ortho consult for Nondisplaced left Weber B ankle fx of unknown date of injury. Likely sustained during fall at home 2 days prior to admission (DOI approx 08/16/22).   -CT reviewed. Will need weightbearing films prior to ruling out surgery -For now, will be strict NWB LLE in CAM boot.  -Maintain boot at all times.  -Follow up immediately after discharge for outpatient evaluation. (Call [phone] to schedule once discharge plans are finalized) -Pain control and VTE ppx per primary team.  Armond Hang 08/22/2022, 5:57 AM

## 2022-08-23 DIAGNOSIS — K2100 Gastro-esophageal reflux disease with esophagitis, without bleeding: Secondary | ICD-10-CM

## 2022-08-23 DIAGNOSIS — Z515 Encounter for palliative care: Secondary | ICD-10-CM

## 2022-08-23 DIAGNOSIS — K922 Gastrointestinal hemorrhage, unspecified: Secondary | ICD-10-CM | POA: Diagnosis not present

## 2022-08-23 DIAGNOSIS — R531 Weakness: Secondary | ICD-10-CM

## 2022-08-23 DIAGNOSIS — K269 Duodenal ulcer, unspecified as acute or chronic, without hemorrhage or perforation: Secondary | ICD-10-CM | POA: Diagnosis not present

## 2022-08-23 DIAGNOSIS — E878 Other disorders of electrolyte and fluid balance, not elsewhere classified: Secondary | ICD-10-CM

## 2022-08-23 DIAGNOSIS — S8265XA Nondisplaced fracture of lateral malleolus of left fibula, initial encounter for closed fracture: Secondary | ICD-10-CM | POA: Diagnosis not present

## 2022-08-23 DIAGNOSIS — N179 Acute kidney failure, unspecified: Secondary | ICD-10-CM | POA: Diagnosis not present

## 2022-08-23 DIAGNOSIS — R1013 Epigastric pain: Secondary | ICD-10-CM | POA: Diagnosis not present

## 2022-08-23 DIAGNOSIS — Z7189 Other specified counseling: Secondary | ICD-10-CM

## 2022-08-23 DIAGNOSIS — Z7984 Long term (current) use of oral hypoglycemic drugs: Secondary | ICD-10-CM

## 2022-08-23 LAB — GLUCOSE, CAPILLARY
Glucose-Capillary: 127 mg/dL — ABNORMAL HIGH (ref 70–99)
Glucose-Capillary: 131 mg/dL — ABNORMAL HIGH (ref 70–99)
Glucose-Capillary: 96 mg/dL (ref 70–99)
Glucose-Capillary: 132 mg/dL — ABNORMAL HIGH (ref 70–99)
Glucose-Capillary: 266 mg/dL — ABNORMAL HIGH (ref 70–99)
Glucose-Capillary: 116 mg/dL — ABNORMAL HIGH (ref 70–99)

## 2022-08-23 LAB — CBC
HCT: 24.8 % — ABNORMAL LOW (ref 39.0–52.0)
Hemoglobin: 8.4 g/dL — ABNORMAL LOW (ref 13.0–17.0)
MCH: 31.9 pg (ref 26.0–34.0)
MCHC: 33.9 g/dL (ref 30.0–36.0)
MCV: 94.3 fL (ref 80.0–100.0)
Platelets: 231 10*3/uL (ref 150–400)
RBC: 2.63 MIL/uL — ABNORMAL LOW (ref 4.22–5.81)
RDW: 14.7 % (ref 11.5–15.5)
WBC: 10.4 10*3/uL (ref 4.0–10.5)
nRBC: 0 % (ref 0.0–0.2)

## 2022-08-23 LAB — MAGNESIUM: Magnesium: 1.6 mg/dL — ABNORMAL LOW (ref 1.7–2.4)

## 2022-08-23 LAB — BASIC METABOLIC PANEL
Anion gap: 4 — ABNORMAL LOW (ref 5–15)
BUN: 15 mg/dL (ref 8–23)
CO2: 19 mmol/L — ABNORMAL LOW (ref 22–32)
Calcium: 7.4 mg/dL — ABNORMAL LOW (ref 8.9–10.3)
Chloride: 114 mmol/L — ABNORMAL HIGH (ref 98–111)
Creatinine, Ser: 0.97 mg/dL (ref 0.61–1.24)
GFR, Estimated: >60 mL/min (ref >60)
Glucose, Bld: 124 mg/dL — ABNORMAL HIGH (ref 70–99)
Potassium: 4.1 mmol/L (ref 3.5–5.1)
Sodium: 137 mmol/L (ref 135–145)

## 2022-08-23 LAB — SURGICAL PATHOLOGY

## 2022-08-23 LAB — VITAMIN D 25 HYDROXY (VIT D DEFICIENCY, FRACTURES): Vit D, 25-Hydroxy: 34.99 ng/mL (ref 30–100)

## 2022-08-23 LAB — PHOSPHORUS: Phosphorus: 2.1 mg/dL — ABNORMAL LOW (ref 2.5–4.6)

## 2022-08-23 MED ORDER — ACETAMINOPHEN 160 MG/5ML PO SOLN
650.0000 mg | Freq: Four times a day (QID) | ORAL | Status: DC | PRN
Start: 2022-08-23 — End: 2022-08-27

## 2022-08-23 MED ORDER — ACETAMINOPHEN 160 MG/5ML PO SOLN
1000.0000 mg | Freq: Three times a day (TID) | ORAL | Status: DC
  Administered 2022-08-23 – 2022-08-27 (×12): 1000 mg via ORAL
  Filled 2022-08-23 (×12): qty 40.6

## 2022-08-23 MED ORDER — MAGNESIUM SULFATE 2 GM/50ML IV SOLN
2.0000 g | Freq: Once | INTRAVENOUS | Status: AC
Start: 2022-08-23 — End: 2022-08-23
  Administered 2022-08-23: 2 g via INTRAVENOUS
  Filled 2022-08-23: qty 50

## 2022-08-23 MED ORDER — PREDNISONE 5 MG PO TABS
5.0000 mg | ORAL_TABLET | Freq: Every day | ORAL | Status: DC
  Administered 2022-08-24 – 2022-08-27 (×4): 5 mg via ORAL
  Filled 2022-08-23 (×4): qty 1

## 2022-08-23 MED ORDER — ENOXAPARIN SODIUM 40 MG/0.4ML IJ SOSY
40.0000 mg | PREFILLED_SYRINGE | INTRAMUSCULAR | Status: DC
  Administered 2022-08-23 – 2022-08-27 (×5): 40 mg via SUBCUTANEOUS
  Filled 2022-08-23 (×5): qty 0.4

## 2022-08-23 MED ORDER — POTASSIUM PHOSPHATES 15 MMOLE/5ML IV SOLN
15.0000 mmol | Freq: Once | INTRAVENOUS | Status: AC
  Administered 2022-08-23: 15 mmol via INTRAVENOUS
  Filled 2022-08-23: qty 5

## 2022-08-23 NOTE — Progress Notes (Addendum)
HD#4 Subjective:   Summary: This is a 76 year old male with a past medical history of esophageal strictures requiring dilation, hypertension, type 2 diabetes, PVD, recurrent DVTs, and PE admitted for suspected GI bleed requiring blood transfusion  Overnight Events: No Overnight events   Upon exam, patient is resting comfortably in bed.  Patient is currently resting in bed in no acute distress at this time.  Patient states that he had a tough weekend, stating that he is still having a sore throat because of his esophageal ulcers.  He also states having burning abdominal pain.  He states that he has not eaten much and stating that it hurts when he swallows.  Patient denies any chest pain or shortness of breath at this time.  He denies any weakness.  He does report that he had a little altercation with physical therapy, and states that PT thought he was being lazy.  He has been in physical therapy before, and states that he worked very hard.  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  She is agreeable to go to a facility for PT.  Patient also states that he is agreeable to talk with palliative care for goals of care.   ROS:  Mouth: Patient endorses painful swallowing Respiratory: Patient denies any shortness of breath Cardiovascular: Patient denies any chest pain MSK: Patient reports left ankle pain  ROS otherwise negative except stated in HPI  Objective:  Vital signs in last 24 hours: Vitals:   08/22/22 0825 08/22/22 1548 08/22/22 2007 08/23/22 0626  BP: 122/60 (!) 124/57 (!) 111/46 (!) 117/58  Pulse: 73 76 70 65  Resp: '18 16 18 20  '$ Temp: 99.1 F (37.3 C) 97.9 F (36.6 C) 98.5 F (36.9 C) 98.4 F (36.9 C)  TempSrc: Oral Oral Oral Oral  SpO2: 99% 100% 97% 98%  Weight:      Height:       Supplemental O2: Room Air SpO2: 98 % O2 Flow Rate (L/min): 2 L/min   Physical Exam:  Constitutional: well-appearing and sitting in bed, in  no acute distress HENT: normocephalic atraumatic, mucous membranes moist Eyes: conjunctiva non-erythematous Cardiovascular: regular rate and rhythm, no m/r/g Pulmonary/Chest: normal work of breathing on room air, lungs clear to auscultation bilaterally Abdominal: soft, non-tender, non-distended, with healing tube wound noted to left upper quadrant MSK: normal bulk and tone.  Boot noted to left lower extremity. Neurological: 5/5 strength in bilateral upper and lower extremities Skin: warm and dry Psych: Normal affect. Normal mood.  Filed Weights   08/19/22 0942  Weight: 81.6 kg     Intake/Output Summary (Last 24 hours) at 08/23/2022 0723 Last data filed at 08/23/2022 4196 Gross per 24 hour  Intake 480 ml  Output 1500 ml  Net -1020 ml   Net IO Since Admission: 3,893.31 mL [08/23/22 0723]  Pertinent Labs:    Latest Ref Rng & Units 08/23/2022    1:44 AM 08/22/2022   12:49 AM 08/21/2022   10:14 PM  CBC  WBC 4.0 - 10.5 K/uL 10.4  9.0    Hemoglobin 13.0 - 17.0 g/dL 8.4  8.1  7.8   Hematocrit 39.0 - 52.0 % 24.8  23.9  22.4   Platelets 150 - 400 K/uL 231  211         Latest Ref Rng & Units 08/23/2022    1:44 AM 08/22/2022   12:49 AM 08/21/2022    1:23 AM  CMP  Glucose 70 - 99 mg/dL 124  101  146   BUN 8 - 23 mg/dL '15  18  30   '$ Creatinine 0.61 - 1.24 mg/dL 0.97  1.04  1.21   Sodium 135 - 145 mmol/L 137  137  138   Potassium 3.5 - 5.1 mmol/L 4.1  3.2  3.1   Chloride 98 - 111 mmol/L 114  111  111   CO2 22 - 32 mmol/L '19  20  20   '$ Calcium 8.9 - 10.3 mg/dL 7.4  7.4  7.7     Imaging: No results found.  Assessment/Plan:   Principal Problem:   UGIB (upper gastrointestinal bleed) Active Problems:   Duodenal ulcer disease   Pressure injury of skin   Closed left fibular fracture   Reflux esophagitis   Patient Summary: [name] is a 76 y.o. with a pertinent PMH of esophageal strictures requiring dilatation, hypertension, type 2 diabetes, PUD, recurrent DVTs, and PE,  who presented with nausea and vomiting as well as weakness and admitted for upper GI bleed requiring blood transfusion.  #Acute upper GI bleed likely secondary to esophageal ulcers, resolved #LA grade D reflux esophagitis #Duodenal ulcer disease -Initial hemoglobin 10.5, to 7.2, and required 1 blood transfusion.  -Patient is status post 1 unit packed red blood cells, and hemoglobin is stable 8.4 today -EGD showing inflammation and necrotic debris with small focus of atypical cells in the esophagus.  Bleed likely from esophageal ulcers. -Pain controlled with Carafate and pantoprazole -GI recommending repeat EGD outpatient in 8 weeks -Continue IV pantoprazole 40 mg every 12 hours, plan to transition to oral PPI -Continue Carafate 1 g every 4 hours -Continue saline infusion -Daily CBC -Restart Lovenox, patient was taking Lovenox prior to admission, Not eliquis like initially thought  -Palliative care following, who will discuss goals of care   #Electrolyte derangements -Patient is status post 2 g mag sulfate yesterday, mag today is 1.6 -Will replete with 2 g mag sulfate today -Recheck Mag tomorrow -Phosphorus level 2.1. -Will replete with 34mol Kphos IV -Recheck Phos tomorrow -Patient is status post potassium repletion, potassium today is 4.1 -Monitor BMP  #Hypocalcemia -Could be due to poor oral intake or vitamin D deficiency -Corrected calcium 8.1 today  -Vitamin D level pending  #Nondisplaced left Weber B ankle fracture (distal fibula) Patient was found to have a distal fibula fracture on 08/21/22.  Orthopedic surgery was consulted, who now has signed off and will see the patient outpatient. No surgery for the patient right now.  Only pain control with oxycodone 5 mg every 4 hours.  Patient reports good pain control.  -CT left ankle showing mildly displaced fracture through the distal fibular metaphysis -Patient to get weightbearing imaging outpatient with orthopedic  surgery -Continue oxycodone 5 mg every 4 hours -Continue to monitor for pain -Patient currently in CAM boot  #Type 2 diabetes mellitus, at goal -A1c 6.8, at goal  -CBG ranging from 98-227 -Continue sliding scale insulin -Hold metformin -Monitor CBG  #Rheumatoid arthritis -[facility] -Plan was made on 08/09/2022, to go down to 4 mg prednisone for 1 month, then 3 mg for another month, then 2 mg for another month, and then 1 mg for final month. -After chart review, will go down to 5 mg prednisone today, and plan will be to continue taper outpatient -Continue to monitor status   Diet:  Full liquid diet IVF: Lactated Ringer's VTE:  Lovenox Code: Full  PT/OT recs: SNF for Subacute PT  Dispo: Anticipated discharge to Skilled nursing facility in 1 days pending.   [place] [facility] Resident PGY-1 Please contact the on call pager after 5 pm and on weekends at [phone].

## 2022-08-23 NOTE — Progress Notes (Signed)
Discontinue telemetry per MD

## 2022-08-23 NOTE — Care Management Important Message (Signed)
Important Message  Patient Details  Name: [name] MRN: [id number] Date of Birth: 12-24-1946   Medicare Important Message Given:  Yes     Hannah Beat 08/23/2022, 12:20 PM

## 2022-08-23 NOTE — Evaluation (Signed)
Clinical/Bedside Swallow Evaluation Patient Details  Name: [name] MRN: [id number] Date of Birth: 08/29/1946  Today's Date: 08/23/2022 Time: SLP Start Time (ACUTE ONLY): 35 SLP Stop Time (ACUTE ONLY): 8242 SLP Time Calculation (min) (ACUTE ONLY): 31 min  Past Medical History:  Past Medical History:  Diagnosis Date   Aortic aneurysm ([place]) 05/2017   Chronic back pain    Diabetes mellitus without complication (HCC)    Dyslipidemia    GERD (gastroesophageal reflux disease)    H/O hiatal hernia    Hypertension    Melanoma ([place])    "back; left arm"   PONV (postoperative nausea and vomiting)    Rheumatoid arthritis ([place])    Spasm of esophagus    Past Surgical History:  Past Surgical History:  Procedure Laterality Date   aneursym repair     aneursym repair and stent placement  05/2017   BIOPSY  08/19/2022   Procedure: BIOPSY;  Surgeon: Ladene Artist, MD;  Location: [facility] ENDOSCOPY;  Service: Gastroenterology;;   CARPAL TUNNEL RELEASE Bilateral    COLON SURGERY  2007   removed 18" of colon   ERCP W/ METAL STENT PLACEMENT  11/2005   [facility] Endo 12/14/2005   ESOPHAGOGASTRODUODENOSCOPY (EGD) WITH ESOPHAGEAL DILATION     "several times'   ESOPHAGOGASTRODUODENOSCOPY (EGD) WITH PROPOFOL N/A 08/19/2022   Procedure: ESOPHAGOGASTRODUODENOSCOPY (EGD) WITH PROPOFOL;  Surgeon: Ladene Artist, MD;  Location: [place];  Service: Gastroenterology;  Laterality: N/A;   HAND SURGERY Bilateral    "plastic knuckles"   LAPAROSCOPIC CHOLECYSTECTOMY  01/2006   MELANOMA EXCISION     "back; left arm"   NASAL SEPTUM SURGERY     PELVIC FRACTURE SURGERY  1964   "busted in 2 places"   RESECTION DISTAL CLAVICAL Right 05/13/2016   Procedure: DISTAL CLAVICLE EXCISION;  Surgeon: Ninetta Lights, MD;  Location: [place];  Service: Orthopedics;  Laterality: Right;   SHOULDER ARTHROSCOPY WITH ROTATOR CUFF REPAIR AND SUBACROMIAL DECOMPRESSION Right 05/13/2016   Procedure: RIGHT  SHOULDER SCOPE DEBRIDEMENT, ACROMIOPLASTY, ROTATOR CUFF REPAIR; RELEASE BICEPS TENDON AND DEBRIDEMENT LABRUM;  Surgeon: Ninetta Lights, MD;  Location: [place];  Service: Orthopedics;  Laterality: Right;   TIBIA FRACTURE SURGERY Left 2006   "put a steel rod in it"   HPI:  76 y.o. male presents to [facility] hospital on 08/18/2022 with a 2 week history of tarry stools and dark emesis. Pt also reports dizziness and 2 falls on the day of admission, found to have L ankle fx. Pt underwent upper GI endoscopy on 8/24 for GIB, revealing LA Grade D reflux esophagitis and duodenal ulcer disease. PMH includes: SCC of tongue s/p XRT, dysphagia (most recent MBS in 2022 at [facility] without aspiration and wtih improved EAT and DIGEST scores), erosive esophagitis with recurrent esophageal strictures, duodenal ulcer disease DMII, HTN, RA.    Assessment / Plan / Recommendation  Clinical Impression  Pt presents with a h/o chronic oropharyngeal and esophageal dysphagia, although currently with c/o more acute esophageal difficulties. He primarily describes a burning sensation, pointing closer to his stomach as he swallows liquids. He denies feeling any acute changes to his oropharyngeal swallowing. He shares strategies that he uses to try to reduce risk of aspiration as much as possible, including being very precise about how much volume he takes at a time. No overt s/s of aspiration noted, although do note that he has had silent aspiration in the past (but not on his most recent imaging). Pt says  he is prone to regurgitation of more solid consistencies, but has not been having as much difficulty on the full liquid diet he in on currently. Education was offered regarding aspiration and esophageal precautions. Pt does know a lot of this information from previous encoutners with other providers, but he did seem to appreciate information about the correlation of oral hygiene and risk for PNA in pts with dysphagia. Oral care  supplies were provided. Pt does not feel like it is a good time to pursue MBS; however, he would like acute SLP f/u with potential for MBS in the future (even if it is on an OP basis). Will leave on full liquid diet for now. SLP Visit Diagnosis: Dysphagia, unspecified (R13.10)    Aspiration Risk  Moderate aspiration risk;Risk for inadequate nutrition/hydration    Diet Recommendation Thin liquid (full liquid diet; advance as medically able and per pt preference)   Liquid Administration via: Cup;Straw Medication Administration: Crushed with puree Supervision: Patient able to self feed;Intermittent supervision to cue for compensatory strategies Compensations: Slow rate;Small sips/bites;Follow solids with liquid Postural Changes: Seated upright at 90 degrees;Remain upright for at least 30 minutes after po intake    Other  Recommendations Oral Care Recommendations: Oral care BID    Recommendations for follow up therapy are one component of a multi-disciplinary discharge planning process, led by the attending physician.  Recommendations may be updated based on patient status, additional functional criteria and insurance authorization.  Follow up Recommendations Skilled nursing-short term rehab (<3 hours/day)      Assistance Recommended at Discharge PRN  Functional Status Assessment Patient has had a recent decline in their functional status and demonstrates the ability to make significant improvements in function in a reasonable and predictable amount of time.  Frequency and Duration min 2x/week  1 week       Prognosis Prognosis for Safe Diet Advancement: Fair Barriers to Reach Goals: Time post onset      Swallow Study   General HPI: 76 y.o. male presents to [facility] hospital on 08/18/2022 with a 2 week history of tarry stools and dark emesis. Pt also reports dizziness and 2 falls on the day of admission, found to have L ankle fx. Pt underwent upper GI endoscopy on 8/24 for GIB, revealing LA  Grade D reflux esophagitis and duodenal ulcer disease. PMH includes: SCC of tongue s/p XRT, dysphagia (most recent MBS in 2022 at [facility] without aspiration and wtih improved EAT and DIGEST scores), erosive esophagitis with recurrent esophageal strictures, duodenal ulcer disease DMII, HTN, RA. Type of Study: Bedside Swallow Evaluation Previous Swallow Assessment: none in our facility but see HPI Diet Prior to this Study: Thin liquids (full liquid diet) Temperature Spikes Noted: No Respiratory Status: Room air Behavior/Cognition: Alert;Cooperative Oral Care Completed by SLP: No Oral Cavity - Dentition: Missing dentition Vision: Functional for self-feeding Self-Feeding Abilities: Able to feed self Patient Positioning: Partially reclined Baseline Vocal Quality: Normal    Oral/Motor/Sensory Function Overall Oral Motor/Sensory Function:  (no overt asymmetry but not directly assessed while trying to establish rapport)   Ice Chips Ice chips: Not tested   Thin Liquid Thin Liquid: Within functional limits Presentation: Cup;Self Fed    Nectar Thick Nectar Thick Liquid: Not tested   Honey Thick Honey Thick Liquid: Not tested   Puree Puree: Not tested   Solid     Solid: Not tested      Osie Bond., M.A. [place] Office [phone]  Secure chat preferred  08/23/2022,11:38 AM

## 2022-08-23 NOTE — Consult Note (Signed)
Consultation Note Date: 08/23/2022   Patient Name: [name]  DOB: [birth year]  MRN: [id number]  Age / Sex: 76 y.o., male  PCP: Elaina Pattee, MD Referring Physician: Lottie Mussel, MD  Reason for Consultation: Establishing goals of care and Psychosocial/spiritual support    HPI/Patient Profile: 76 y.o. male   admitted on 08/18/2022 with past medical history significant for RA, chronic DVT, hiatal hernia, erosive esophagitis / esophageal stricture requiring multiple dilations, diverticulosis, DM, melanoma, HTN.    Patient has been followed long term by [facility] for esophageal strictures requiring dilation.   He was brought by EMS to ED today for evaluation of tarry stools and dizziness.  Patient reports ongoing dysphagia "for years"   Admitted for treatment and stabilization,  EGD on August 24.    Reported left ankle pain, reports a fall at home prior to hospitalization, CT significant for nondisplaced left ankle fracture, date of injury unknown.     Patient lives alone, with limited social support.  Ex-wife is main support.  Patient faces treatment option decisions, advanced directive decisions and anticipatory care needs.  Clinical Assessment and Goals of Care:  This NP Wadie Lessen reviewed medical records, received report from team, assessed the patient and then meet at the patient's bedside  to discuss diagnosis, prognosis, GOC, EOL wishes disposition and options.   Concept of Palliative Care was introduced as specialized medical care for people and their families living with serious illness.  If focuses on providing relief from the symptoms and stress of a serious illness.  The goal is to improve quality of life for both the patient and the family.  Values and goals of care important to patient and family were attempted to be elicited.  Created space and opportunity for patient  to  explore thoughts and feelings regarding current medical situation.    Patient verbalizes his frustration and living with chronic disease specific to his many esophageal symptoms. "  I have been poked and prodded and had so many tests I cannot keep track of it over the years".  [name] how hard it is being chronically ill and losing independence.   Encouraged Mr Shean to think about his ongoing care needs into the future and the importance of talking with his sons about this.  Encouraged advanced care planning and offered assistance with that during this hospital stay, declined today.    A  discussion was had today regarding advanced directives.  Concepts specific to code status, artifical feeding and hydration, continued IV antibiotics and rehospitalization was had.    The difference between a aggressive medical intervention path  and a palliative comfort care path for this patient at this time was had.   Questions and concerns addressed.  Patient  encouraged to call with questions or concerns.     PMT will continue to support holistically.      NEXT OF KIN. Son/ [name]    SUMMARY OF RECOMMENDATIONS    Code Status/Advance Care Planning: Full code Educated  patient to consider DNR/DNI status understanding evidenced based poor outcomes in similar hospitalized patient, as the cause of arrest is likely associated with advanced chronic illness rather than an easily reversible acute cardio-pulmonary event.    Symptom Management:  Pain: change Tylenol to liquid for easier administration    Palliative Prophylaxis:  Aspiration, Bowel Regimen, Delirium Protocol, Frequent Pain Assessment, and Oral Care  Additional Recommendations (Limitations, Scope, Preferences): Full Scope Treatment  Psycho-social/Spiritual:  Desire for further Chaplaincy support:no/declined   Prognosis:  Unable to determine  Discharge Planning: [place] for rehab with  Palliative care service follow-up      Primary Diagnoses: Present on Admission:  UGIB (upper gastrointestinal bleed)  (Resolved) Upper GI bleed  Closed left fibular fracture   I have reviewed the medical record, interviewed the patient and family, and examined the patient. The following aspects are pertinent.  Past Medical History:  Diagnosis Date   Aortic aneurysm ([place]) 05/2017   Chronic back pain    Diabetes mellitus without complication (HCC)    Dyslipidemia    GERD (gastroesophageal reflux disease)    H/O hiatal hernia    Hypertension    Melanoma ([place])    "back; left arm"   PONV (postoperative nausea and vomiting)    Rheumatoid arthritis (HCC)    Spasm of esophagus    Social History   Socioeconomic History   Marital status: Divorced    Spouse name: Not on file   Number of children: Not on file   Years of education: Not on file   Highest education level: Not on file  Occupational History   Not on file  Tobacco Use   Smoking status: Former    Packs/day: 1.00    Years: 46.00    Total pack years: 46.00    Types: Cigarettes    Quit date: 11/26/2005    Years since quitting: 16.7   Smokeless tobacco: Never  Substance and Sexual Activity   Alcohol use: Yes    Alcohol/week: 9.0 standard drinks of alcohol    Types: 9 Shots of liquor per week    Comment: 05/23/2014 "3, 1 shot drinks maybe 3 times/wk"   Drug use: No   Sexual activity: Not Currently  Other Topics Concern   Not on file  Social History Narrative   Not on file   Social Determinants of Health   Financial Resource Strain: Not on file  Food Insecurity: Not on file  Transportation Needs: Not on file  Physical Activity: Not on file  Stress: Not on file  Social Connections: Not on file   Family History  Problem Relation Age of Onset   Heart disease Mother    Heart disease Father    Cancer Maternal Grandmother    Scheduled Meds:  acetaminophen (TYLENOL) oral liquid 160 mg/5 mL  1,000 mg Oral TID    diclofenac Sodium  2 g Topical QID   insulin aspart  0-15 Units Subcutaneous TID WC   insulin aspart  0-5 Units Subcutaneous QHS   pantoprazole  40 mg Intravenous Q12H   predniSONE  7.5 mg Oral Q breakfast   sucralfate  1 g Oral TID WC & HS   Continuous Infusions:  sodium chloride 100 mL/hr at 08/23/22 0329   magnesium sulfate bolus IVPB 2 g (08/23/22 1147)   PRN Meds:.acetaminophen (TYLENOL) oral liquid 160 mg/5 mL, oxyCODONE Medications Prior to Admission:  Prior to Admission medications   Medication Sig Start Date End Date Taking? Authorizing Provider  abatacept Maureen Chatters)  250 MG injection Inject 250 mg into the vein every 30 (thirty) days. Patient taking differently: Inject 750 mg into the vein every 30 (thirty) days. 02/12/21  Yes Sanjuan Dame, MD  apixaban (ELIQUIS) 5 MG TABS tablet Place 5 mg into feeding tube 2 (two) times daily.   Yes [provider]  atorvastatin (LIPITOR) 40 MG tablet Place 40 mg into feeding tube daily. 06/25/22  Yes [provider]  enoxaparin (LOVENOX) 40 MG/0.4ML injection Inject 40 mg into the skin daily. 08/06/22  Yes [provider]  famotidine (PEPCID) 40 MG/5ML suspension Place 40 mg into feeding tube daily. 07/23/22  Yes [provider]  ferrous sulfate 300 (60 Fe) MG/5ML syrup Place 300 mg into feeding tube daily.   Yes [provider]  folic acid (FOLVITE) 1 MG tablet Place 1 mg into feeding tube daily. 07/31/22  Yes [provider]  levothyroxine (SYNTHROID) 50 MCG tablet Place 50 mcg into feeding tube daily. 05/29/22  Yes [provider]  magnesium oxide (MAG-OX) 400 (240 Mg) MG tablet Place 400 mg into feeding tube daily.   Yes [provider]  metFORMIN (GLUCOPHAGE) 500 MG tablet Place 500 mg into feeding tube daily.   Yes [provider]  Methotrexate 25 MG/ML SOSY Inject 15 mg into the skin once a week.   Yes [provider]  ondansetron (ZOFRAN) 4 MG  tablet Place 4 mg into feeding tube every 12 (twelve) hours as needed for nausea or vomiting.   Yes [provider]  pantoprazole (PROTONIX) 40 MG tablet 40 mg daily. Via tube 07/31/22  Yes [provider]  predniSONE (DELTASONE) 1 MG tablet Place 1-4 mg into feeding tube See admin instructions. 4 mg daily for 1 month, 3 mg daily for 1 month, 2 mg daily for 1 month, 1 mg daily for 1 month.   Yes [provider]  predniSONE (DELTASONE) 5 MG tablet Take 1 tablet (5 mg total) by mouth daily with breakfast. Take 1 tablet ('5mg'$ ) daily starting on 02/10/21. Patient taking differently: Place 5 mg into feeding tube daily with breakfast. 02/10/21  Yes Sanjuan Dame, MD  sucralfate (CARAFATE) 1 GM/10ML suspension Take 1 g by mouth 4 (four) times daily -  before meals and at bedtime.   Yes [provider]  tadalafil (CIALIS) 5 MG tablet Place 10 mg into feeding tube daily.   Yes [provider]   No Known Allergies Review of Systems  Constitutional:  Positive for fatigue.  Gastrointestinal:        Difficulty swallowing   Neurological:  Positive for weakness.    Physical Exam Constitutional:      Appearance: He is underweight. He is ill-appearing.  Cardiovascular:     Rate and Rhythm: Normal rate.  Pulmonary:     Effort: Pulmonary effort is normal.  Skin:    General: Skin is warm and dry.  Neurological:     Mental Status: He is alert.     Vital Signs: BP 135/61 (BP Location: Left Arm)   Pulse 71   Temp 98 F (36.7 C) (Oral)   Resp 18   Ht '6\' 1"'$  (1.854 m)   Wt 81.6 kg   SpO2 100%   BMI 23.75 kg/m  Pain Scale: 0-10   Pain Score: 0-No pain   SpO2: SpO2: 100 % O2 Device:SpO2: 100 % O2 Flow Rate: .O2 Flow Rate (L/min): 2 L/min  IO: Intake/output summary:  Intake/Output Summary (Last 24 hours) at 08/23/2022 1154 Last  data filed at 08/23/2022 8088 Gross per 24 hour  Intake 480 ml  Output 1500 ml  Net -1020 ml    LBM: Last BM Date :  08/22/22 Baseline Weight: Weight: 81.6 kg Most recent weight: Weight: 81.6 kg     Palliative Assessment/Data: 50 %   Discussed with treatment team   Signed by: Wadie Lessen, NP   Please contact Palliative Medicine Team phone at [phone] for questions and concerns.  For individual provider: See Shea Evans

## 2022-08-23 NOTE — Progress Notes (Signed)
Physical Therapy Treatment Patient Details Name: [name] MRN: [id number] DOB: Feb 25, 1946 Today's Date: 08/23/2022   History of Present Illness 76 y.o. male presents to [facility] hospital on 08/18/2022 with a 2 week history of tarry stools and dark emesis. Pt also reports dizziness and 2 falls on the day of admission. Pt underwent upper GI endoscopy on 8/24. PMH includes DMII, HTN, RA.    PT Comments    Continuing work on functional mobility and activity tolerance;  Session focused on functional transfers while maintaining updated weiht bearing status of NWB LLE; Overall, pt performed basic pivot transfer bed to recliner on his R side well with RW; touched L foot down a few times but worked hard to keep weight offof LLE; Given NWB status, rec post-acute rehab at [facility]   Recommendations for follow up therapy are one component of a multi-disciplinary discharge planning process, led by the attending physician.  Recommendations may be updated based on patient status, additional functional criteria and insurance authorization.  Follow Up Recommendations  Skilled nursing-short term rehab (<3 hours/day) Can patient physically be transported by private vehicle: Yes   Assistance Recommended at Discharge Intermittent Supervision/Assistance  Patient can return home with the following A little help with walking and/or transfers;A little help with bathing/dressing/bathroom;Assistance with cooking/housework;Help with stairs or ramp for entrance;Assist for transportation   Equipment Recommendations  Rolling walker (2 wheels);BSC/3in1;Wheelchair (measurements PT);Wheelchair cushion (measurements PT)    Recommendations for Other Services       Precautions / Restrictions Precautions Precautions: Fall Precaution Comments: monitor BP, 2 falls prior to admission Restrictions LLE Weight Bearing: Non weight bearing Other Position/Activity Restrictions: boot     Mobility  Bed Mobility Overal bed  mobility: Needs Assistance Bed Mobility: Supine to Sit     Supine to sit: Supervision     General bed mobility comments: Slow moving to EOB; Supervision for safety, especially with updated NWB LLE status    Transfers Overall transfer level: Needs assistance Equipment used: Rolling walker (2 wheels) Transfers: Sit to/from Stand, Bed to chair/wheelchair/BSC Sit to Stand: Min assist Stand pivot transfers: Min assist         General transfer comment: Heavy cues for NWB LLE    Ambulation/Gait               General Gait Details: Deferred for today   Stairs             Wheelchair Mobility    Modified Rankin (Stroke Patients Only)       Balance     Sitting balance-Leahy Scale: Fair       Standing balance-Leahy Scale: Poor                              Cognition Arousal/Alertness: Awake/alert Behavior During Therapy: WFL for tasks assessed/performed Overall Cognitive Status: Impaired/Different from baseline Area of Impairment: Safety/judgement, Awareness, Problem solving                         Safety/Judgement: Decreased awareness of safety, Decreased awareness of deficits Awareness: Intellectual            Exercises      General Comments General comments (skin integrity, edema, etc.): Dizziness again after transferring to recliner; will consider orthsotatics next session      Pertinent Vitals/Pain Pain Assessment Pain Assessment: Faces Faces Pain Scale: Hurts even more Pain Location: L ankle Pain Descriptors /  Indicators: Sore Pain Intervention(s): Monitored during session, Repositioned    Home Living                          Prior Function            PT Goals (current goals can now be found in the care plan section) Acute Rehab PT Goals Patient Stated Goal: to go home, return to independence PT Goal Formulation: With patient Time For Goal Achievement: 09/02/22 Potential to Achieve Goals:  Fair Progress towards PT goals: Progressing toward goals    Frequency    Min 3X/week      PT Plan Current plan remains appropriate    Co-evaluation              AM-PAC PT "6 Clicks" Mobility   Outcome Measure  Help needed turning from your back to your side while in a flat bed without using bedrails?: A Little Help needed moving from lying on your back to sitting on the side of a flat bed without using bedrails?: A Little Help needed moving to and from a bed to a chair (including a wheelchair)?: A Little Help needed standing up from a chair using your arms (e.g., wheelchair or bedside chair)?: A Little Help needed to walk in hospital room?: Total Help needed climbing 3-5 steps with a railing? : Total 6 Click Score: 14    End of Session Equipment Utilized During Treatment: Gait belt Activity Tolerance: Patient tolerated treatment well Patient left: in chair;with call bell/phone within reach;with chair alarm set Nurse Communication: Mobility status PT Visit Diagnosis: Other abnormalities of gait and mobility (R26.89);Muscle weakness (generalized) (M62.81)     Time: 5993-5701 PT Time Calculation (min) (ACUTE ONLY): 16 min  Charges:  $Therapeutic Activity: 8-22 mins                    Roney Marion, PT  Acute Rehabilitation Services Office [phone]    [place] 08/23/2022, 2:57 PM

## 2022-08-23 NOTE — TOC Progression Note (Signed)
Transition of Care [facility]) - Progression Note    Patient Details  Name: [name] MRN: [id number] Date of Birth: [birth year]  Transition of Care [facility]) CM/SW [place], [place] Phone Number: 08/23/2022, 5:07 PM  Clinical Narrative:    CSW attempted to speak with pt but was told by pt that he is tired and people have been coming in and calling all day. He would like for CSW to follow up in the morning.    Expected Discharge Plan: [place] Barriers to Discharge: Continued Medical Work up, Ship broker  Expected Discharge Plan and Services Expected Discharge Plan: [place] In-house Referral: Clinical Social Work     Living arrangements for the past 2 months: Single Family Home                                       Social Determinants of Health (SDOH) Interventions    Readmission Risk Interventions     No data to display

## 2022-08-24 DIAGNOSIS — K269 Duodenal ulcer, unspecified as acute or chronic, without hemorrhage or perforation: Secondary | ICD-10-CM | POA: Diagnosis not present

## 2022-08-24 DIAGNOSIS — N179 Acute kidney failure, unspecified: Secondary | ICD-10-CM | POA: Diagnosis not present

## 2022-08-24 DIAGNOSIS — K2100 Gastro-esophageal reflux disease with esophagitis, without bleeding: Secondary | ICD-10-CM | POA: Diagnosis not present

## 2022-08-24 DIAGNOSIS — S8265XA Nondisplaced fracture of lateral malleolus of left fibula, initial encounter for closed fracture: Secondary | ICD-10-CM | POA: Diagnosis not present

## 2022-08-24 LAB — CBC
HCT: 26.5 % — ABNORMAL LOW (ref 39.0–52.0)
Hemoglobin: 8.9 g/dL — ABNORMAL LOW (ref 13.0–17.0)
MCH: 32.1 pg (ref 26.0–34.0)
MCHC: 33.6 g/dL (ref 30.0–36.0)
MCV: 95.7 fL (ref 80.0–100.0)
Platelets: 242 10*3/uL (ref 150–400)
RBC: 2.77 MIL/uL — ABNORMAL LOW (ref 4.22–5.81)
RDW: 14.6 % (ref 11.5–15.5)
WBC: 8 10*3/uL (ref 4.0–10.5)
nRBC: 0 % (ref 0.0–0.2)

## 2022-08-24 LAB — BASIC METABOLIC PANEL
Anion gap: 7 (ref 5–15)
BUN: 12 mg/dL (ref 8–23)
CO2: 19 mmol/L — ABNORMAL LOW (ref 22–32)
Calcium: 7.7 mg/dL — ABNORMAL LOW (ref 8.9–10.3)
Chloride: 111 mmol/L (ref 98–111)
Creatinine, Ser: 0.84 mg/dL (ref 0.61–1.24)
GFR, Estimated: >60 mL/min (ref >60)
Glucose, Bld: 108 mg/dL — ABNORMAL HIGH (ref 70–99)
Potassium: 3.9 mmol/L (ref 3.5–5.1)
Sodium: 137 mmol/L (ref 135–145)

## 2022-08-24 LAB — PHOSPHORUS: Phosphorus: 3.2 mg/dL (ref 2.5–4.6)

## 2022-08-24 LAB — GLUCOSE, CAPILLARY
Glucose-Capillary: 124 mg/dL — ABNORMAL HIGH (ref 70–99)
Glucose-Capillary: 114 mg/dL — ABNORMAL HIGH (ref 70–99)
Glucose-Capillary: 157 mg/dL — ABNORMAL HIGH (ref 70–99)
Glucose-Capillary: 81 mg/dL (ref 70–99)

## 2022-08-24 LAB — MAGNESIUM: Magnesium: 1.7 mg/dL (ref 1.7–2.4)

## 2022-08-24 NOTE — Progress Notes (Signed)
HD#5 Subjective:   Summary: This is a 76 year old male with a past medical history of esophageal strictures requiring dilation, hypertension, type 2 diabetes, PVD, recurrent DVTs, and PE admitted for suspected GI bleed requiring blood transfusion  Overnight Events: No Overnight events   Upon exam, patient is resting comfortably in bed.  Patient is currently resting in bed in no acute distress at this time.  Patient states that he slept better today than yesterday. He reports that his son is helping with him finding a SNF facility to go. Patient reports that he is still having a burning sensation in his abdoman that get better with carafate and PPI. He reports that he did spit up some this morning and he noticed some blood tinged vomit. He denies any worsening leg pain at this time and states that the oxycodone is doing its job.   ROS otherwise negative except stated in HPI  Objective:  Vital signs in last 24 hours: Vitals:   08/23/22 2053 08/24/22 0604 08/24/22 0815 08/24/22 1156  BP: (!) 124/52 (!) 157/70 (!) 146/64 117/64  Pulse: 66 69 70 63  Resp: '19 18 16 19  '$ Temp: 99 F (37.2 C) 98.2 F (36.8 C) 99.4 F (37.4 C) 98.1 F (36.7 C)  TempSrc: Oral Oral Oral Oral  SpO2: 99% 99% 99% 99%  Weight:      Height:       Supplemental O2: Room Air SpO2: 99 % O2 Flow Rate (L/min): 2 L/min   Physical Exam:  Constitutional: well-appearing and sitting in bed, in no acute distress HENT: normocephalic atraumatic, mucous membranes moist Eyes: conjunctiva non-erythematous Cardiovascular: regular rate and rhythm, no m/r/g Pulmonary/Chest: normal work of breathing on room air, lungs clear to auscultation bilaterally Abdominal: soft, non-tender, non-distended, with healing tube wound noted to left upper quadrant MSK: normal bulk and tone.  Boot noted to left lower extremity. Neurological: 5/5 strength in bilateral upper and lower extremities Skin: warm and dry Psych: Normal affect.  Normal mood.  Filed Weights   08/19/22 0942  Weight: 81.6 kg     Intake/Output Summary (Last 24 hours) at 08/24/2022 1437 Last data filed at 08/24/2022 0700 Gross per 24 hour  Intake 5546.73 ml  Output 1600 ml  Net 3946.73 ml   Net IO Since Admission: 7,840.04 mL [08/24/22 1437]  Pertinent Labs:    Latest Ref Rng & Units 08/24/2022    1:05 AM 08/23/2022    1:44 AM 08/22/2022   12:49 AM  CBC  WBC 4.0 - 10.5 K/uL 8.0  10.4  9.0   Hemoglobin 13.0 - 17.0 g/dL 8.9  8.4  8.1   Hematocrit 39.0 - 52.0 % 26.5  24.8  23.9   Platelets 150 - 400 K/uL 242  231  211        Latest Ref Rng & Units 08/24/2022    1:05 AM 08/23/2022    1:44 AM 08/22/2022   12:49 AM  CMP  Glucose 70 - 99 mg/dL 108  124  101   BUN 8 - 23 mg/dL '12  15  18   '$ Creatinine 0.61 - 1.24 mg/dL 0.84  0.97  1.04   Sodium 135 - 145 mmol/L 137  137  137   Potassium 3.5 - 5.1 mmol/L 3.9  4.1  3.2   Chloride 98 - 111 mmol/L 111  114  111   CO2 22 - 32 mmol/L '19  19  20   '$ Calcium 8.9 - 10.3 mg/dL 7.7  7.4  7.4     Imaging: No results found.  Assessment/Plan:   Principal Problem:   UGIB (upper gastrointestinal bleed) Active Problems:   Duodenal ulcer disease   Pressure injury of skin   Closed left fibular fracture   Reflux esophagitis   Patient Summary: [name] is a 76 y.o. with a pertinent PMH of esophageal strictures requiring dilatation, hypertension, type 2 diabetes, PUD, recurrent DVTs, and PE, who presented with nausea and vomiting as well as weakness and admitted for upper GI bleed requiring blood transfusion.  #Acute upper GI bleed likely secondary to esophageal ulcers, resolved #LA grade D reflux esophagitis #Duodenal ulcer disease -Initial hemoglobin 10.5, to 7.2, and required 1 blood transfusion.  -Patient is status post 1 unit packed red blood cells, and hemoglobin is stable 8.9 today -EGD showing inflammation and necrotic debris with small focus of atypical cells in the esophagus.  Bleed  likely from esophageal ulcers. -Pain controlled with Carafate and pantoprazole -GI recommending repeat EGD outpatient in 8 weeks -Continue IV pantoprazole 40 mg every 12 hours, plan to transition to oral PPI at discharge -Continue Carafate 1 g every 4 hours -Discontinue saline infusion today given patient received 4 L yesterday, and blood pressures are increasing  -Daily CBC -Lovenox was restarted yesterday, hemoglobin stable at 8.9 -Pallative care following who will discuss goals of care which currently are full scope treatment   #Electrolyte derangements, resolved  -Patient is status post 2 g mag sulfate yesterday, mag today is 1.7 -Phosphorus level 3.2 after supplementation yesterday -Potassium stable at 3.9  -Monitor BMP  #Hypocalcemia -Could be due to poor oral intake  -Corrected calcium 8.4 today  -Vitamin D level 35 -Continue to monitor   #Nondisplaced left Weber B ankle fracture (distal fibula) Patient was found to have a distal fibula fracture on 08/21/22.  Orthopedic surgery was consulted, who now has signed off and will see the patient outpatient. No surgery for the patient right now.  Only pain control with oxycodone 5 mg every 4 hours.  Patient reports good pain control.  -CT left ankle showing mildly displaced fracture through the distal fibular metaphysis -Patient to get weightbearing imaging outpatient with orthopedic surgery -Continue oxycodone 5 mg every 4 hours -Continue to monitor for pain -Patient currently in CAM boot  #Type 2 diabetes mellitus, at goal -A1c 6.8, at goal -CBG ranging from 96-266 but mailing at 120s to 130s  -Continue sliding scale insulin -Hold metformin -Monitor CBG  #Rheumatoid arthritis -Chart review showing that [facility] rheumatology was tapering down prednisone. -Plan was made on 08/09/2022, to go down to 4 mg prednisone for 1 month, then 3 mg for another month, then 2 mg for another month, and then 1 mg for final month. -Continue with  5 mg Prednisone   Diet:  Full liquid diet IVF: N/A VTE:  Lovenox Code: Full PT/OT recs: SNF for Subacute PT  Dispo: Anticipated discharge to Skilled nursing facility in 1 days pending. Awaiting SNF approval   [facility] Resident PGY-1 Please contact the on call pager after 5 pm and on weekends at [phone].

## 2022-08-24 NOTE — TOC Progression Note (Signed)
Transition of Care [facility]) - Progression Note    Patient Details  Name: [name] MRN: [id number] Date of Birth: 09-Mar-1946  Transition of Care [facility]) CM/SW [place], [place] Phone Number: 08/24/2022, 3:20 PM  Clinical Narrative:    CSW attempted to speak with pt again today he stated he is working with PT and needs to give them his attention. Pt would like for CSW to follow up later or contact his son. CSW contacted pt son and he will go over offers with pt today once is done with PT. CSW provided contact information.  [facility] is able to provide a private room for pt as requested by pt son. When pt is medically stable pt can DC.   Expected Discharge Plan: [place] Barriers to Discharge: Continued Medical Work up, Ship broker  Expected Discharge Plan and Services Expected Discharge Plan: [place] In-house Referral: Clinical Social Work     Living arrangements for the past 2 months: Single Family Home                                       Social Determinants of Health (SDOH) Interventions    Readmission Risk Interventions     No data to display

## 2022-08-24 NOTE — Progress Notes (Signed)
Physical Therapy Treatment Patient Details Name: [name] MRN: [id number] DOB: 07/08/1946 Today's Date: 08/24/2022   History of Present Illness 76 y.o. male presents to [facility] hospital on 08/18/2022 with a 2 week history of tarry stools and dark emesis. Pt also reports dizziness and 2 falls on the day of admission. Pt underwent upper GI endoscopy on 8/24. PMH includes DMII, HTN, RA.    PT Comments    Patient made good progress today with mobility and maintaining NWB on Lt LE. He required min assist to rise form EOB, recliner, and BSC with moderate VC's for safe hand placement and to maintain NWB on Lt  LE. Pt had difficulty keeping Lt foot off the ground initially and improved ability to do so after completing exercises in standing to facilitate NWB. In bathroom pt completed self care with min assist for set up to shave and wash hair. Pt able to stand with single UE support to wash lower body at sink. EOS resting in recliner with alarm on. He will benefit from [place] rehab at [facility]. Will progress as able.    Recommendations for follow up therapy are one component of a multi-disciplinary discharge planning process, led by the attending physician.  Recommendations may be updated based on patient status, additional functional criteria and insurance authorization.  Follow Up Recommendations  Skilled nursing-short term rehab (<3 hours/day) Can patient physically be transported by private vehicle: Yes   Assistance Recommended at Discharge Intermittent Supervision/Assistance  Patient can return home with the following A little help with walking and/or transfers;A little help with bathing/dressing/bathroom;Assistance with cooking/housework;Help with stairs or ramp for entrance;Assist for transportation   Equipment Recommendations  Rolling walker (2 wheels);BSC/3in1;Wheelchair (measurements PT);Wheelchair cushion (measurements PT)    Recommendations for Other Services       Precautions /  Restrictions Precautions Precautions: Fall Precaution Comments: monitor BP, 2 falls prior to admission Restrictions Weight Bearing Restrictions: Yes LLE Weight Bearing: Non weight bearing Other Position/Activity Restrictions: CAM walker boot     Mobility  Bed Mobility Overal bed mobility: Needs Assistance Bed Mobility: Supine to Sit     Supine to sit: Supervision, HOB elevated     General bed mobility comments: Slow moving to EOB; Supervision for safety    Transfers Overall transfer level: Needs assistance Equipment used: Rolling walker (2 wheels) Transfers: Sit to/from Stand, Bed to chair/wheelchair/BSC Sit to Stand: Min assist Stand pivot transfers: Min assist         General transfer comment: min assist for power up from EOB, recliner, and BSC. VC's for hand placement required for safety with each stand.    Ambulation/Gait Ambulation/Gait assistance: Min assist Gait Distance (Feet): 12 Feet (12, 10) Assistive device: Rolling walker (2 wheels) Gait Pattern/deviations: Step-through pattern, Decreased stride length, Decreased weight shift to left Gait velocity: dec     General Gait Details: Min assist wtih mod verbal cues for sequencing and maintaining NWB on Lt LE. pt able to hop with RW after completing exercsies to facilitate NWB on Lt LE. Ambulate short forward bout in room then turned back to chair. Amb short bout into bathroom.   Stairs             Wheelchair Mobility    Modified Rankin (Stroke Patients Only)       Balance Overall balance assessment: Needs assistance Sitting-balance support: No upper extremity supported, Feet supported Sitting balance-Leahy Scale: Fair Sitting balance - Comments: pt leaning on sink while shaving with single UE   Standing  balance support: Bilateral upper extremity supported, Reliant on assistive device for balance, During functional activity Standing balance-Leahy Scale: Poor Standing balance comment: heavy  reliance on RW and external support                            Cognition Arousal/Alertness: Awake/alert Behavior During Therapy: WFL for tasks assessed/performed Overall Cognitive Status: Impaired/Different from baseline Area of Impairment: Safety/judgement, Awareness, Problem solving                         Safety/Judgement: Decreased awareness of safety, Decreased awareness of deficits Awareness: Emergent Problem Solving: Requires verbal cues          Exercises General Exercises - Lower Extremity Hip Flexion/Marching: AROM, Left, 20 reps, Standing (2x10) Other Exercises Other Exercises: 1x10, 1x5 rpes Rt LE hopping with Lt LE off ground. pt using RW (stationary) Other Exercises: pt sat in bathroom on BSC at sink to shave self, min assist needed for some set up    General Comments        Pertinent Vitals/Pain Pain Assessment Pain Assessment: Faces Faces Pain Scale: Hurts even more Pain Location: Lt ankle, stomach Pain Descriptors / Indicators: Sore Pain Intervention(s): Limited activity within patient's tolerance, Monitored during session, Repositioned    Home Living                          Prior Function            PT Goals (current goals can now be found in the care plan section) Acute Rehab PT Goals Patient Stated Goal: to go home, return to independence PT Goal Formulation: With patient Time For Goal Achievement: 09/02/22 Potential to Achieve Goals: Fair Progress towards PT goals: Progressing toward goals    Frequency    Min 3X/week      PT Plan Current plan remains appropriate    Co-evaluation              AM-PAC PT "6 Clicks" Mobility   Outcome Measure  Help needed turning from your back to your side while in a flat bed without using bedrails?: A Little Help needed moving from lying on your back to sitting on the side of a flat bed without using bedrails?: A Little Help needed moving to and from a bed to  a chair (including a wheelchair)?: A Little Help needed standing up from a chair using your arms (e.g., wheelchair or bedside chair)?: A Little Help needed to walk in hospital room?: Total Help needed climbing 3-5 steps with a railing? : Total 6 Click Score: 14    End of Session Equipment Utilized During Treatment: Gait belt Activity Tolerance: Patient tolerated treatment well Patient left: in chair;with call bell/phone within reach;with chair alarm set Nurse Communication: Mobility status PT Visit Diagnosis: Other abnormalities of gait and mobility (R26.89);Muscle weakness (generalized) (M62.81)     Time: 4540-9811 PT Time Calculation (min) (ACUTE ONLY): 49 min  Charges:  $Gait Training: 8-22 mins $Therapeutic Exercise: 8-22 mins $Therapeutic Activity: 8-22 mins                     Verner Mould, DPT Acute Rehabilitation Services Office [phone] Pager [phone]  08/24/22 3:55 PM

## 2022-08-25 DIAGNOSIS — K269 Duodenal ulcer, unspecified as acute or chronic, without hemorrhage or perforation: Secondary | ICD-10-CM | POA: Diagnosis not present

## 2022-08-25 DIAGNOSIS — R1013 Epigastric pain: Secondary | ICD-10-CM

## 2022-08-25 DIAGNOSIS — S8265XA Nondisplaced fracture of lateral malleolus of left fibula, initial encounter for closed fracture: Secondary | ICD-10-CM | POA: Diagnosis not present

## 2022-08-25 DIAGNOSIS — K922 Gastrointestinal hemorrhage, unspecified: Secondary | ICD-10-CM | POA: Diagnosis not present

## 2022-08-25 LAB — GLUCOSE, CAPILLARY
Glucose-Capillary: 109 mg/dL — ABNORMAL HIGH (ref 70–99)
Glucose-Capillary: 179 mg/dL — ABNORMAL HIGH (ref 70–99)
Glucose-Capillary: 135 mg/dL — ABNORMAL HIGH (ref 70–99)
Glucose-Capillary: 124 mg/dL — ABNORMAL HIGH (ref 70–99)

## 2022-08-25 MED ORDER — LIDOCAINE VISCOUS HCL 2 % MT SOLN
15.0000 mL | Freq: Once | OROMUCOSAL | Status: AC
  Administered 2022-08-25: 15 mL via ORAL
  Filled 2022-08-25: qty 15

## 2022-08-25 MED ORDER — PANTOPRAZOLE SODIUM 40 MG PO TBEC
40.0000 mg | DELAYED_RELEASE_TABLET | Freq: Two times a day (BID) | ORAL | Status: DC
  Administered 2022-08-25 – 2022-08-27 (×4): 40 mg via ORAL
  Filled 2022-08-25 (×4): qty 1

## 2022-08-25 MED ORDER — FAMOTIDINE 20 MG PO TABS
20.0000 mg | ORAL_TABLET | Freq: Every day | ORAL | Status: DC
  Administered 2022-08-25 – 2022-08-27 (×3): 20 mg via ORAL
  Filled 2022-08-25 (×3): qty 1

## 2022-08-25 MED ORDER — POLYETHYLENE GLYCOL 3350 17 G PO PACK
17.0000 g | PACK | ORAL | Status: DC | PRN
Start: 2022-08-25 — End: 2022-08-27
  Administered 2022-08-25: 17 g via ORAL
  Filled 2022-08-25: qty 1

## 2022-08-25 MED ORDER — ALUM & MAG HYDROXIDE-SIMETH 200-200-20 MG/5ML PO SUSP
30.0000 mL | Freq: Once | ORAL | Status: AC
  Administered 2022-08-25: 30 mL via ORAL
  Filled 2022-08-25: qty 30

## 2022-08-25 MED ORDER — ENSURE ENLIVE PO LIQD
237.0000 mL | Freq: Two times a day (BID) | ORAL | Status: DC
  Administered 2022-08-26 – 2022-08-27 (×4): 237 mL via ORAL

## 2022-08-25 NOTE — Progress Notes (Signed)
Initial Nutrition Assessment  DOCUMENTATION CODES:   Not applicable  INTERVENTION:   Ensure Enlive po BID, each supplement provides 350 kcal and 20 grams of protein. Advance diet as medically appropriate Encourage good PO intake  NUTRITION DIAGNOSIS:   Inadequate oral intake related to acute illness (Ulcers) as evidenced by per patient/family report.  GOAL:   Patient will meet greater than or equal to 90% of their needs  MONITOR:   PO intake, Supplement acceptance, Diet advancement, Labs, I & O's  REASON FOR ASSESSMENT:   Consult Assessment of nutrition requirement/status  ASSESSMENT:   76 y.o. male presented to the ED with tarry stools and dark emesis x 2 weeks. 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 Pt admitted with upper GI bleed and AKI.   8/23 - admitted 8/24 - EGD; diet advanced to clear liquids 8/25 - diet advanced to full liquids   Pt laying in bed. Reports that he has been eating ok, just has some burning/pain in the epigastric  area related to his "ulcers." Pt states that this is something that he has dealt with in the past and that he would just go without eating until better (could be a few weeks) or someone would "cure them." Pt reports that he currently is eating soft foods like soup, jello, and pudding. Denies nay difference between hot or cold things. Shares that he is missing his bottom teeth from radiation and that he lost his bottom dentures and cannot find them. Reports that he has always had difficulty swallowing and that he has now missed two appointments with [facility] for his esophageal dilation due to being sick.  Discussed the use of nutritional supplements with pt. Pt reports that he has used those in the past to supplement his intake. Pt agreeable to drinking while admitted.   Per Care Everywhere, pt has not had any weight loss within  the past year. Pt previously with G-J tube placed and was removed a few months ago.   Medications reviewed and include: Pepcid, NovoLog, Protonix, Prednisone, Sucralfate Labs reviewed: 24 hr CBG 81-179   NUTRITION - FOCUSED PHYSICAL EXAM:  Deferred to follow-up  Diet Order:   Diet Order             Diet full liquid Room service appropriate? Yes; Fluid consistency: Thin  Diet effective now                   EDUCATION NEEDS:   Education needs have been addressed  Skin:  Skin Assessment: Skin Integrity Issues: Skin Integrity Issues:: Stage I, Other (Comment) Stage I: L Heel Other: Wound: L Arm  Last BM:  8/27  Height:   Ht Readings from Last 1 Encounters:  08/19/22 '6\' 1"'$  (1.854 m)    Weight:   Wt Readings from Last 1 Encounters:  08/19/22 81.6 kg    Ideal Body Weight:  83.6 kg  BMI:  Body mass index is 23.75 kg/m.  Estimated Nutritional Needs:   Kcal:  2000-2200  Protein:  100-115 grams  Fluid:  >/= 2 L    Hermina Barters RD, LDN Clinical Dietitian See [facility] for contact information.

## 2022-08-25 NOTE — Progress Notes (Addendum)
HD#6 Subjective:   Summary: This is a 76 year old male with a past medical history of esophageal strictures requiring dilation, hypertension, type 2 diabetes, PVD, recurrent DVTs, and PE admitted for suspected GI bleed requiring blood transfusion  Overnight Events: No Overnight events   Patient seen at bedside. Reports sleeping "so-so". Concern today is burning sensation of epigastric ulcers. Patient expresses concern over being discharged with current symptoms,worried about bleeding. Patient was reassured and discussed ordering medication to further manage pain, is amenable to taking new  medications. Pain today 8/10, denies any pain at baseline when he was home. Pain during feeding tube was similar to current pain. Patient mentions family is concerned for him.  Patient reports that his son agreed to a facility in [place], [place], but after discussing with case worker, it looks like the agreement was made for [place].  Patient has no concerns about his left leg pain today.  ROS otherwise negative except stated in HPI  Objective:  Vital signs in last 24 hours: Vitals:   08/24/22 1639 08/24/22 2127 08/25/22 0438 08/25/22 0750  BP: 131/67 (!) 160/72 137/78 (!) 149/66  Pulse: 68 74 70 69  Resp: '17 18 19 18  '$ Temp: 98.6 F (37 C) 98.1 F (36.7 C) 98.1 F (36.7 C) 98.5 F (36.9 C)  TempSrc: Oral Oral Oral Oral  SpO2: 100% 100% 98% 98%  Weight:      Height:       Supplemental O2: Room Air SpO2: 98 % O2 Flow Rate (L/min): 2 L/min   Physical Exam:  Constitutional: well-appearing and sitting in bed, in no acute distress HENT: normocephalic atraumatic, mucous membranes moist Eyes: conjunctiva non-erythematous Cardiovascular: regular rate and rhythm, no m/r/g Pulmonary/Chest: Lungs clear to auscultation bilaterally, no increased work of breathing noted Abdominal: Discomfort to epigastric region, no tenderness appreciated.  Soft abdomen.  Normoactive bowel sounds.  With healing tube  wound noted to left upper quadrant MSK: normal bulk and tone.  Boot noted to left lower extremity.  No edema noted Neurological: 5/5 strength in bilateral upper and lower extremities Skin: warm and dry Psych: Normal affect. Normal mood.  Filed Weights   08/19/22 0942  Weight: 81.6 kg     Intake/Output Summary (Last 24 hours) at 08/25/2022 1422 Last data filed at 08/25/2022 9741 Gross per 24 hour  Intake 240 ml  Output 1200 ml  Net -960 ml   Net IO Since Admission: 6,880.04 mL [08/25/22 1422]  Pertinent Labs:    Latest Ref Rng & Units 08/24/2022    1:05 AM 08/23/2022    1:44 AM 08/22/2022   12:49 AM  CBC  WBC 4.0 - 10.5 K/uL 8.0  10.4  9.0   Hemoglobin 13.0 - 17.0 g/dL 8.9  8.4  8.1   Hematocrit 39.0 - 52.0 % 26.5  24.8  23.9   Platelets 150 - 400 K/uL 242  231  211        Latest Ref Rng & Units 08/24/2022    1:05 AM 08/23/2022    1:44 AM 08/22/2022   12:49 AM  CMP  Glucose 70 - 99 mg/dL 108  124  101   BUN 8 - 23 mg/dL '12  15  18   '$ Creatinine 0.61 - 1.24 mg/dL 0.84  0.97  1.04   Sodium 135 - 145 mmol/L 137  137  137   Potassium 3.5 - 5.1 mmol/L 3.9  4.1  3.2   Chloride 98 - 111 mmol/L 111  114  111   CO2 22 - 32 mmol/L '19  19  20   '$ Calcium 8.9 - 10.3 mg/dL 7.7  7.4  7.4     Imaging: No results found.  Assessment/Plan:   Principal Problem:   UGIB (upper gastrointestinal bleed) Active Problems:   Duodenal ulcer disease   Pressure injury of skin   Closed left fibular fracture   Reflux esophagitis   Abdominal pain, epigastric   Patient Summary: [name] is a 76 y.o. with a pertinent PMH of esophageal strictures requiring dilatation, hypertension, type 2 diabetes, PUD, recurrent DVTs, and PE, who presented with nausea and vomiting as well as weakness and admitted for upper GI bleed requiring blood transfusion.  #Acute upper GI bleed likely secondary to esophageal ulcers, resolved #LA grade D reflux esophagitis #Duodenal ulcer disease -Initial  hemoglobin 10.5, to 7.2, and required 1 blood transfusion during admission.  Patient was initially given blood transfusion on admission as well. -Patient is status post 2 units packed red blood cells, and hemoglobin is stable. -EGD showing inflammation and necrotic debris with small focus of atypical cells in the esophagus.  Bleed likely from esophageal ulcers. -Patient is still having burning pain to his abdominal area even with administration of Carafate and pantoprazole -Patient will be given Maalox and lidocaine mouth solution as well as famotidine tablet 20 mg daily -Carafate 3 times daily with meals and at bedtime -Transition patient to oral pantoprazole 40 mg twice daily -Reevaluate patient to see if this regimen helps his abdominal pain -GI recommending repeat EGD outpatient in 8 weeks -Pallative care following who will discuss goals of care which currently are full scope treatment -Given this pain, discharge has been held, will reevaluate how this regimen helps with his pain, and could discharge tomorrow -Given the concern of patient not eating due to pain, will consult with dietitian for caloric needs -Monitor H&H  #Electrolyte derangements, resolved  -Potassium, mag, and phosphorus supplemented during admission -All stable -BMP tomorrow -Mag tomorrow  #Hypocalcemia -Could be due to poor oral intake  -Corrected calcium 8.4 -Vitamin D level 35 -Continue to monitor   #Nondisplaced left Weber B ankle fracture (distal fibula) Patient was found to have a distal fibula fracture on 08/21/22.  Orthopedic surgery was consulted, who now has signed off and will see the patient outpatient. No surgery for the patient right now.  Only pain control with oxycodone 5 mg every 4 hours.  Patient reports good pain control.  -CT left ankle showing mildly displaced fracture through the distal fibular metaphysis -Patient to get weightbearing imaging outpatient with orthopedic surgery -Continue  oxycodone 5 mg every 4 hours -Continue to monitor for pain -Patient currently in CAM boot  #Type 2 diabetes mellitus, at goal -A1c 6.8, at goal -CBG ranging from 81-179 -Continue sliding scale insulin -Hold metformin -Monitor CBG  #Rheumatoid arthritis -Chart review showing that [facility] rheumatology was tapering down prednisone. -Plan was made on 08/09/2022, to go down to 4 mg prednisone for 1 month, then 3 mg for another month, then 2 mg for another month, and then 1 mg for final month. -Continue with 5 mg Prednisone inpatient  Diet:  Full liquid diet IVF: N/A VTE:  Lovenox Code: Full PT/OT recs: SNF for Subacute PT  Dispo: Anticipated discharge to Skilled nursing facility in 1 days pending, clinical improvement.  SNF has accepted.  [facility] Resident PGY-1 [phone] Please contact the on call pager after 5 pm and on weekends at [phone].

## 2022-08-26 DIAGNOSIS — K222 Esophageal obstruction: Secondary | ICD-10-CM

## 2022-08-26 DIAGNOSIS — M069 Rheumatoid arthritis, unspecified: Secondary | ICD-10-CM | POA: Diagnosis not present

## 2022-08-26 DIAGNOSIS — S8265XA Nondisplaced fracture of lateral malleolus of left fibula, initial encounter for closed fracture: Secondary | ICD-10-CM | POA: Diagnosis not present

## 2022-08-26 DIAGNOSIS — K209 Esophagitis, unspecified without bleeding: Secondary | ICD-10-CM

## 2022-08-26 DIAGNOSIS — K269 Duodenal ulcer, unspecified as acute or chronic, without hemorrhage or perforation: Secondary | ICD-10-CM | POA: Diagnosis not present

## 2022-08-26 DIAGNOSIS — K264 Chronic or unspecified duodenal ulcer with hemorrhage: Principal | ICD-10-CM

## 2022-08-26 LAB — BASIC METABOLIC PANEL
Anion gap: 7 (ref 5–15)
BUN: 7 mg/dL — ABNORMAL LOW (ref 8–23)
CO2: 25 mmol/L (ref 22–32)
Calcium: 8.5 mg/dL — ABNORMAL LOW (ref 8.9–10.3)
Chloride: 107 mmol/L (ref 98–111)
Creatinine, Ser: 1.08 mg/dL (ref 0.61–1.24)
GFR, Estimated: >60 mL/min (ref >60)
Glucose, Bld: 107 mg/dL — ABNORMAL HIGH (ref 70–99)
Potassium: 4 mmol/L (ref 3.5–5.1)
Sodium: 139 mmol/L (ref 135–145)

## 2022-08-26 LAB — CBC WITH DIFFERENTIAL/PLATELET
Abs Immature Granulocytes: 0 10*3/uL (ref 0.00–0.07)
Basophils Absolute: 0.1 10*3/uL (ref 0.0–0.1)
Basophils Relative: 2 %
Eosinophils Absolute: 0.3 10*3/uL (ref 0.0–0.5)
Eosinophils Relative: 4 %
HCT: 27.4 % — ABNORMAL LOW (ref 39.0–52.0)
Hemoglobin: 9.1 g/dL — ABNORMAL LOW (ref 13.0–17.0)
Lymphocytes Relative: 13 %
Lymphs Abs: 0.8 10*3/uL (ref 0.7–4.0)
MCH: 31.4 pg (ref 26.0–34.0)
MCHC: 33.2 g/dL (ref 30.0–36.0)
MCV: 94.5 fL (ref 80.0–100.0)
Monocytes Absolute: 0.5 10*3/uL (ref 0.1–1.0)
Monocytes Relative: 8 %
Neutro Abs: 4.6 10*3/uL (ref 1.7–7.7)
Neutrophils Relative %: 73 %
Platelets: 235 10*3/uL (ref 150–400)
RBC: 2.9 MIL/uL — ABNORMAL LOW (ref 4.22–5.81)
RDW: 14.6 % (ref 11.5–15.5)
WBC: 6.3 10*3/uL (ref 4.0–10.5)
nRBC: 0 % (ref 0.0–0.2)
nRBC: 0 /100 WBC

## 2022-08-26 LAB — MAGNESIUM: Magnesium: 1.2 mg/dL — ABNORMAL LOW (ref 1.7–2.4)

## 2022-08-26 LAB — GLUCOSE, CAPILLARY
Glucose-Capillary: 115 mg/dL — ABNORMAL HIGH (ref 70–99)
Glucose-Capillary: 160 mg/dL — ABNORMAL HIGH (ref 70–99)
Glucose-Capillary: 139 mg/dL — ABNORMAL HIGH (ref 70–99)
Glucose-Capillary: 136 mg/dL — ABNORMAL HIGH (ref 70–99)

## 2022-08-26 MED ORDER — LIDOCAINE VISCOUS HCL 2 % MT SOLN: 15.0000 mL | Freq: Three times a day (TID) | OROMUCOSAL | Status: DC | PRN

## 2022-08-26 MED ORDER — ALUM & MAG HYDROXIDE-SIMETH 200-200-20 MG/5ML PO SUSP: 30.0000 mL | Freq: Three times a day (TID) | ORAL | Status: DC | PRN

## 2022-08-26 MED ORDER — MAGNESIUM SULFATE 2 GM/50ML IV SOLN
2.0000 g | Freq: Once | INTRAVENOUS | Status: AC
Start: 2022-08-26 — End: 2022-08-27
  Administered 2022-08-26: 2 g via INTRAVENOUS
  Filled 2022-08-26: qty 50

## 2022-08-26 NOTE — Discharge Instructions (Addendum)
Mr. Tramain Gershman It was a pleasure taking care of you at [place] were admitted for esophageal ulcer bleed.  We are discharging you home now that you are doing better. Please follow the following instructions.   1) Regarding your esophagitis and gastrointestinal ulcers, please continue to take your PPI 40 mg twice daily, Carafate, Maalox, famotidine, and lidocaine.  Continue to follow-up with [facility].  Plan is to wait for your acute inflammation to go down, and then follow with Dr. Leroy Sea  2) Regarding your distal fibular fracture, please follow-up with orthopedics.  I will send you home on oxycodone 5 mg as needed for pain.  We have reached out to your orthopedic surgery team, who will either contact you or [name] to schedule a follow-up appointment outpatient.  3) Follow-up with [facility] for your prednisone taper regarding your rheumatoid arthritis.  Continue taking 5 mg for the next month, then 4 mg for the next month, then 3 mg for the next month, then 2 mg for the next month, and finally 1 mg for the final month.  4) Please follow-up with your primary care physician for repeat hemoglobin check and electrolyte check including magnesium, phosphorus, and potassium  5) Continue taking all your other home medications as prescribed  Take care,  Dr. Leigh Aurora, DO

## 2022-08-26 NOTE — Progress Notes (Addendum)
HD#7 Subjective:   Summary: This is a 76 year old male with a past medical history of esophageal strictures requiring dilation, hypertension, type 2 diabetes, PVD, recurrent DVTs, and PE admitted for suspected GI bleed requiring blood transfusion  Overnight events: No overnight events  Patient seen at bedside this morning.  Patient reports that he is still having this burning sensation to his epigastric region.  He denies being associated with food.  He states that the lidocaine, famotidine, Maalox, Carafate, and pantoprazole has not helped.  He states that the pain is always there.  Patient does endorse that he has had a PEG tube in the past for this, which did not help.  Patient is becoming frustrated about this given that his symptoms are not going away.  He reports that he will try to eat today.  He denies any chest pain, shortness of breath, or left leg pain.  He denies any other concerns at this time.  ROS otherwise negative except stated in HPI  Objective:  Vital signs in last 24 hours: Vitals:   08/25/22 1642 08/25/22 2034 08/26/22 0428 08/26/22 0832  BP: (!) 136/59 (!) 141/62 (!) 165/69 (!) 146/73  Pulse: (!) 58 61 66 69  Resp: '19 18 19 18  '$ Temp: 98.7 F (37.1 C) (!) 97.5 F (36.4 C) 98.4 F (36.9 C) 99.5 F (37.5 C)  TempSrc: Oral Oral Oral Oral  SpO2: 99% 100% 98% 97%  Weight:      Height:       Supplemental O2: Room Air SpO2: 97 % O2 Flow Rate (L/min): 2 L/min   Physical Exam:  Constitutional: well-appearing and sitting in bed, in no acute distress HENT: normocephalic atraumatic, mucous membranes moist Eyes: conjunctiva non-erythematous Cardiovascular: regular rate and rhythm, no m/r/g Pulmonary/Chest: Lungs clear to auscultation bilaterally, no increased work of breathing noted Abdominal: Epigastric discomfort with palpation.  Healing PEG tube wounds noted. MSK: normal bulk and tone.  Boot noted to left lower extremity.  No edema noted Neurological: 5/5  strength in bilateral upper and lower extremities Skin: warm and dry Psych: Normal affect. Normal mood.  Filed Weights   08/19/22 0942  Weight: 81.6 kg     Intake/Output Summary (Last 24 hours) at 08/26/2022 1347 Last data filed at 08/26/2022 1000 Gross per 24 hour  Intake 360 ml  Output 1900 ml  Net -1540 ml   Net IO Since Admission: 4,940.04 mL [08/26/22 1347]  Pertinent Labs:    Latest Ref Rng & Units 08/26/2022    1:14 AM 08/24/2022    1:05 AM 08/23/2022    1:44 AM  CBC  WBC 4.0 - 10.5 K/uL 6.3  8.0  10.4   Hemoglobin 13.0 - 17.0 g/dL 9.1  8.9  8.4   Hematocrit 39.0 - 52.0 % 27.4  26.5  24.8   Platelets 150 - 400 K/uL 235  242  231        Latest Ref Rng & Units 08/26/2022    1:14 AM 08/24/2022    1:05 AM 08/23/2022    1:44 AM  CMP  Glucose 70 - 99 mg/dL 107  108  124   BUN 8 - 23 mg/dL '7  12  15   '$ Creatinine 0.61 - 1.24 mg/dL 1.08  0.84  0.97   Sodium 135 - 145 mmol/L 139  137  137   Potassium 3.5 - 5.1 mmol/L 4.0  3.9  4.1   Chloride 98 - 111 mmol/L 107  111  114  CO2 22 - 32 mmol/L '25  19  19   '$ Calcium 8.9 - 10.3 mg/dL 8.5  7.7  7.4     Imaging: No results found.  Assessment/Plan:   Principal Problem:   UGIB (upper gastrointestinal bleed) Active Problems:   Duodenal ulcer disease   Pressure injury of skin   Closed left fibular fracture   Reflux esophagitis   Abdominal pain, epigastric   Patient Summary: [name] is a 76 y.o. with a pertinent PMH of esophageal strictures requiring dilatation, hypertension, type 2 diabetes, PUD, recurrent DVTs, and PE, who presented with nausea and vomiting as well as weakness and admitted for upper GI bleed requiring blood transfusion.  #Acute upper GI bleed likely secondary to esophageal ulcers, resolved #LA grade D reflux esophagitis #Duodenal ulcer disease -Initial hemoglobin 10.5, to 7.2, and required 1 blood transfusion during admission.  Patient was initially given blood transfusion on admission as  well. -Patient is status post 2 units packed red blood cells, and hemoglobin is stable. -EGD showing inflammation and necrotic debris with small focus of atypical cells in the esophagus.  Bleed likely from esophageal ulcers. -Patient is still having burning pain to his abdominal area even with administration of Carafate and pantoprazole -Today even with Maalox, lidocaine, and famotidine along with Carafate and pantoprazole, patient still having burning abdominal pain -Dietitian recommending dysphagia diet, but given the decreased caloric intake of patient, patient can have regular diet -Given continuous burning pain, GI consulted who will see patient at bedside -H&H stable with hemoglobin 9.1 today -We will follow GIs recommendations -Discharge pending if there are no other options for abdominal pain from GI -Continue Carafate 1 g 3 times daily and bedtime, pantoprazole oral 40 mg twice daily, Maalox, lidocaine, and famotidine -EGD outpatient 8 weeks  #Electrolyte derangements, resolved  -Potassium within normal limits -Magnesium 1.2 on BMP this a.m. -Replete with 2 g mag sulfate -Monitor BMP -Mag and Phos levels pending  #Hypocalcemia, resolved -Could be due to poor oral intake  -Corrected calcium 9.2  #Nondisplaced left Weber B ankle fracture (distal fibula) Patient was found to have a distal fibula fracture on 08/21/22.  Orthopedic surgery was consulted, who now has signed off and will see the patient outpatient. No surgery for the patient right now.  Only pain control with oxycodone 5 mg every 4 hours.  Patient reports good pain control.  -CT left ankle showing mildly displaced fracture through the distal fibular metaphysis -Patient to get weightbearing imaging outpatient with orthopedic surgery -Continue oxycodone 5 mg every 4 hours -Continue to monitor for pain -Patient currently in CAM boot  #Type 2 diabetes mellitus, at goal -A1c 6.8, at goal -CBG ranging from  115-160 -sliding scale insulin -Hold metformin -Monitor CBG  #Rheumatoid arthritis -Chart review showing that [facility] rheumatology was tapering down prednisone. -Plan was made on 08/09/2022, to go down to 4 mg prednisone for 1 month, then 3 mg for another month, then 2 mg for another month, and then 1 mg for final month. -Continue with 5 mg Prednisone inpatient  Diet:  Regular IVF: N/A VTE:  Lovenox Code: Full PT/OT recs: SNF for Subacute PT  Dispo: Anticipated discharge to Skilled nursing facility pending GI recommendations and evaluation.  [facility] Resident PGY-1 [phone] Please contact the on call pager after 5 pm and on weekends at [phone].

## 2022-08-26 NOTE — Discharge Summary (Addendum)
Name: [name] MRN: [id number] DOB: 27-Sep-1946 76 y.o. PCP: Dennis Pattee, MD  Date of Admission: 08/18/2022  2:14 PM Date of Discharge: 09/01 Attending Physician: Dr.  Lottie Johnson  Discharge Diagnosis: Principal Problem:   UGIB (upper gastrointestinal bleed) Active Problems:   Duodenal ulcer with hemorrhage   Esophageal stricture   Pressure injury of skin   Closed left fibular fracture   Reflux esophagitis   Abdominal pain, epigastric   Acute esophagitis    Discharge Medications: Allergies as of 08/27/2022   No Known Allergies      Medication List     STOP taking these medications    famotidine 40 MG/5ML suspension Commonly known as: PEPCID Replaced by: famotidine 20 MG tablet       TAKE these medications    abatacept 250 MG injection Commonly known as: ORENCIA Inject 250 mg into the vein every 30 (thirty) days. What changed: how much to take   alum & mag hydroxide-simeth 200-200-20 MG/5ML suspension Commonly known as: MAALOX/MYLANTA Take 30 mLs by mouth 3 (three) times daily as needed for indigestion or heartburn.   atorvastatin 40 MG tablet Commonly known as: LIPITOR Take 1 tablet (40 mg total) by mouth daily. What changed: how to take this   diclofenac Sodium 1 % Gel Commonly known as: VOLTAREN Apply 2 g topically 4 (four) times daily.   enoxaparin 40 MG/0.4ML injection Commonly known as: LOVENOX Inject 40 mg into the skin daily.   famotidine 20 MG tablet Commonly known as: PEPCID Take 1 tablet (20 mg total) by mouth daily. Replaces: famotidine 40 MG/5ML suspension   ferrous sulfate 300 (60 Fe) MG/5ML syrup Take 5 mLs (300 mg total) by mouth daily. What changed: how to take this   folic acid 1 MG tablet Commonly known as: FOLVITE Take 1 tablet (1 mg total) by mouth daily. What changed: how to take this   levothyroxine 50 MCG tablet Commonly known as: SYNTHROID Take 1 tablet (50 mcg total) by mouth daily. What  changed: how to take this   lidocaine 2 % solution Commonly known as: XYLOCAINE Use as directed 15 mLs in the mouth or throat 3 (three) times daily as needed for mouth pain (Indigestion).   magnesium oxide 400 (240 Mg) MG tablet Commonly known as: MAG-OX Take 1 tablet (400 mg total) by mouth daily. What changed: how to take this   metFORMIN 500 MG tablet Commonly known as: GLUCOPHAGE Take 1 tablet (500 mg total) by mouth daily. What changed: how to take this   Methotrexate 25 MG/ML Sosy Inject 15 mg into the skin once a week.   ondansetron 4 MG tablet Commonly known as: ZOFRAN Take 1 tablet (4 mg total) by mouth every 12 (twelve) hours as needed for nausea or vomiting. What changed: how to take this   oxyCODONE 5 MG immediate release tablet Commonly known as: Oxy IR/ROXICODONE Take 1 tablet (5 mg total) by mouth every 4 (four) hours as needed for moderate pain.   pantoprazole 40 MG tablet Commonly known as: PROTONIX Take 1 tablet (40 mg total) by mouth 2 (two) times daily. Via tube What changed:  how to take this when to take this   predniSONE 1 MG tablet Commonly known as: DELTASONE Take 1-5 tablets (1-5 mg total) by mouth See admin instructions. 5 mg daily for 1 month  4 mg daily for 1 month, 3 mg daily for 1 month, 2 mg daily for 1 month, 1 mg daily for  1 month. What changed:  how much to take how to take this additional instructions Another medication with the same name was removed. Continue taking this medication, and follow the directions you see here.   sucralfate 1 GM/10ML suspension Commonly known as: CARAFATE Take 1 g by mouth 4 (four) times daily -  before meals and at bedtime.   tadalafil 5 MG tablet Commonly known as: CIALIS Take 2 tablets (10 mg total) by mouth daily. What changed: how to take this        Disposition and follow-up:   Mr.[name] was discharged from [facility] in Stable condition.  At the hospital  follow up visit please address:  1.  Follow-up:  a.  LA grade D reflux esophagitis, duodenal ulcer disease: Patient had a EGD in hospital showing grade D reflux esophagitis and duodenal ulcers.  There was biopsy report showing necrotic debris and inflammation of the esophagus.  GI recommending follow-up with Dr. Leroy Johnson at [facility] for further management once acute inflammation resolves.  Follow-up for symptomatic pain control as well as CBC check.    b.   Electrolyte derangements including hypomagnesemia, hypokalemia, hypophosphatemia: Patient had decreased magnesium, potassium, and phosphorus on hospital stay.  Has been repleted.  Follow-up outpatient with BMP, mag, and Phos check.   c.  Rheumatoid arthritis: Patient to follow-up with [facility] rheumatology for prednisone taper.  Patient was on 5 mg during hospital.  Patient to continue taper outpatient with follow-up with [facility] rheumatology.   d.  Nondisplaced left Weber B ankle fracture: Patient was diagnosed with a distal fibula fracture during hospital stay.  This occurred prior to admission.  Orthopedics followed patient inpatient, and stated that they will see the patient outpatient for weightbearing scan.  Patient sent home on oxycodone 5 mg as needed for pain.  Orthopedics will either call patient or [name] for follow-up outpatient.  2.  Labs / imaging needed at time of follow-up: CBC, BMP  3.  Pending labs/ test needing follow-up: N/A  4.  Medication Changes  1.  Start taking Maalox, lidocaine, pantoprazole, prednisone as directed.  Please take all of the medications as prescribed.  Follow-up Appointments:   Follow-up Information     [facility] Follow up.   Specialty: Orthopedic Surgery Why: Orthopedic surgery will either call you or the [facility] facility for your follow-up appointment. Contact information: [address] [place] [address] [phone]         Dennis Pattee, MD. Call today.   Specialty: Family  Medicine Why: To be evaluated outpatient for hospital follow-up Contact information: [place] [address] [phone]                 Hospital Course by problem list: #Acute GI Bleeding Secondary to Unknown Causes, Resolved  #LA Grade D Reflux Esophagitis  #Duodenal Ulcer Disease  Pt has a week history of dark emesis and stools. He has a history of erosive esophagitis and recurrent esophageal strictures that have required multiple EGDs with dilatations. 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 His last Endoscopy was in May of this year, and last colonoscopy was 09/01/21.  Initially, there was confusion on if patient was on Plavix versus Eliquis versus Lovenox.  It was then figured out that the patient stopped  Plavix and Eliquis, and was only on Lovenox prior to admission.  GI was brought on board and recommended IV Pantoprazole. EGD showed LA Grade D Reflux Esophagitis with no bleeding, a small hiatal hernia, normal stomach, non-bleeding duodenal ulcers. Preliminary biopsy results show inflammation and necrotic debris with a small focus of atypical cells. Pt's hemoglobin level upon admission was 10.5, and was administered 1 unit of RBC due to supposed active bleeding. His Hb after administration was 11.6. During admission, patient's hemoglobin dropped to 7.1 on 08/21/2022.  He was given another unit of blood which brought his hemoglobin up to 8.1 on 08/22/2022.  We continued his IV PPI, and added carafate to his regimen.  Maalox, lidocaine, famotidine also added for pain relief.  Pt's primary GI doctor is Dr. Leroy Johnson at [facility], recommend outpatient follow up with him, and follow up EGD in 8 weeks with his doctor.   Due to severity and intermittent natures of patient's condition, palliative care consult was  placed and he states that he would like full school treatment right now.  Patient will be sent to SNF for further physical therapy and palliative care will follow outpatient.   Patient will be sent out on oral pantoprazole 40 mg twice daily, Carafate 3 times daily, famotidine, Maalox, and lidocaine for symptomatic control until patient can follow-up with GI outpatient.  #Non-Displaced Left Weber B Ankle Fracture (Distal Fibula) On hospital course day 2, patient was complaining of L ankle pain. On examination, L ankle was very tender to palpation. X-Ray was ordered which revealed oblique fracture of distal fibula, which could have happened because of patient falls that led to his admission. Orthopedics was consulted and placed him in a CAM boot, and recommended conservative management for now, and weight-bearing films to rule out surgery. He was given Oxycodone '5mg'$  for pain.  Patient will be discharged to follow-up with orthopedics outpatient for weightbearing scans. Patient will be discharged to [facility] for rehab.  #Rheumatoid Arthritis Patient has a history of rheumatoid arthritis, and had been taking 5 mg prednisone prior to admission.  Upon further chart review during admission showing that [facility] rheumatology was tapering off his prednisone.  Patient was initially started on 7.5 mg prednisone during admission, and transition to 5 mg after seeing this rheumatology note.  Patient will be discharged on 5 mg prednisone to be tapering off.  Goal will be to get to 4 mg for 1 month, 3 mg for 1 month, 2 mg for 1 month, and 1 mg for another month.  #Electrolyte derangements Patient initially had low potassium on admission of 3.4.  Patient also had decreased magnesium of 1.4.  Patient also was found to have decreased phosphorus of 2.1.  Potassium, magnesium, and phosphorus was supplemented in hospital.  #Type II Diabetes Mellitus At home, patient was taking metformin '500mg'$  BID, and upon admission his sugar  level was 356.  A1c on 08/20/2022 was 6.8. Initially, we decided to start him on Semglee 5U. However, due to poor oral intake, we decided against it and started a sliding scale, which helped get his glucose to goal.  Upon discharge, patient will be continued on metformin 500 mg twice daily and further management will be outpatient.    Discharge Subjective:  Patient is resting comfortably in bed upon my exam.  Patient reports that he is still having this burning abdominal pain today but reports that he understands that it will take some time to get better.  Patient request to be  discharged today, and understands this plan going forward.  Patient reports she had a good bowel movement yesterday.  He also states that the pain in his left leg is better.  He states that the oxycodone is helping.  Patient denies any chest pain, shortness of breath, or any other symptoms besides burning abdominal pain today.  Patient is ready to go home.  Discharge Exam:   BP (!) 147/66 (BP Location: Left Arm)   Pulse 69   Temp 98 F (36.7 C) (Oral)   Resp 17   Ht '6\' 1"'$  (1.854 m)   Wt 81.6 kg   SpO2 100%   BMI 23.75 kg/m  Constitutional: Well-appearing and laying in bed in no acute distress HENT: normocephalic atraumatic, mucous membranes moist Eyes: conjunctiva non-erythematous Cardiovascular: regular rate and rhythm, no m/r/g Pulmonary/Chest: normal work of breathing on room air, lungs clear to auscultation bilaterally Abdominal: Soft, with discomfort to epigastric region on palpation.  Nondistended. MSK: normal bulk and tone.  With Cam boot noted to left lower extremity. Neurological: alert & oriented x 3, 5/5 strength in bilateral upper and lower extremities, normal gait Skin: warm and dry Psych: Normal Mood and affect   Pertinent Labs, Studies, and Procedures:     Latest Ref Rng & Units 08/26/2022    1:14 AM 08/24/2022    1:05 AM 08/23/2022    1:44 AM  CBC  WBC 4.0 - 10.5 K/uL 6.3  8.0  10.4    Hemoglobin 13.0 - 17.0 g/dL 9.1  8.9  8.4   Hematocrit 39.0 - 52.0 % 27.4  26.5  24.8   Platelets 150 - 400 K/uL 235  242  231        Latest Ref Rng & Units 08/26/2022    1:14 AM 08/24/2022    1:05 AM 08/23/2022    1:44 AM  CMP  Glucose 70 - 99 mg/dL 107  108  124   BUN 8 - 23 mg/dL '7  12  15   '$ Creatinine 0.61 - 1.24 mg/dL 1.08  0.84  0.97   Sodium 135 - 145 mmol/L 139  137  137   Potassium 3.5 - 5.1 mmol/L 4.0  3.9  4.1   Chloride 98 - 111 mmol/L 107  111  114   CO2 22 - 32 mmol/L '25  19  19   '$ Calcium 8.9 - 10.3 mg/dL 8.5  7.7  7.4     CT HEAD WO CONTRAST (5MM)  Result Date: 08/19/2022 CLINICAL DATA:  Head trauma, minor (Age >= 65y) fall on eliquis+lovenox+plavix at home EXAM: CT HEAD WITHOUT CONTRAST TECHNIQUE: Contiguous axial images were obtained from the base of the skull through the vertex without intravenous contrast. RADIATION DOSE REDUCTION: This exam was performed according to the departmental dose-optimization program which includes automated exposure control, adjustment of the mA and/or kV according to patient size and/or use of iterative reconstruction technique. COMPARISON:  None Available. FINDINGS: Brain: There is atrophy and chronic small vessel disease changes. No acute intracranial abnormality. Specifically, no hemorrhage, hydrocephalus, mass lesion, acute infarction, or significant intracranial injury. Vascular: No hyperdense vessel or unexpected calcification. Skull: No acute calvarial abnormality. Sinuses/Orbits: No acute findings Other: None IMPRESSION: Atrophy, chronic microvascular disease. No acute intracranial abnormality. Electronically Signed   By: Rolm Baptise M.D.   On: 08/19/2022 01:40   CT Abdomen Pelvis Wo Contrast  Result Date: 08/18/2022 CLINICAL DATA:  Abdominal pain, acute, nonlocalized gi bleeding EXAM: CT ABDOMEN AND PELVIS WITHOUT CONTRAST TECHNIQUE: Multidetector CT  imaging of the abdomen and pelvis was performed following the standard protocol  without IV contrast. RADIATION DOSE REDUCTION: This exam was performed according to the departmental dose-optimization program which includes automated exposure control, adjustment of the mA and/or kV according to patient size and/or use of iterative reconstruction technique. COMPARISON:  05/28/2015 FINDINGS: Lower chest: Fibrotic changes within the bilateral lung bases with bronchiectasis, progressed from prior. Small superimposed opacity at the right lung base posteriorly may represent atelectasis or consolidation. Heart size within normal limits. Abnormal appearance of the distal esophagus which is circumferentially thickened and fluid-filled (series 3, image 11). Extensive aortic and coronary artery atherosclerosis. Prominent bilateral gynecomastia. Hepatobiliary: No focal liver abnormality is seen. Status post cholecystectomy. No biliary dilatation. Pancreas: Unremarkable. No pancreatic ductal dilatation or surrounding inflammatory changes. Spleen: Normal in size without focal abnormality. Adrenals/Urinary Tract: Unremarkable adrenal glands. 4.5 cm right renal cyst, which requires no follow-up. 6 mm nonobstructing stone within the lower pole of the left kidney. No hydronephrosis. Urinary bladder is incompletely distended. Stomach/Bowel: Stomach is fluid distended but appears otherwise unremarkable. Appendix appears normal. Scattered colonic diverticulosis. No evidence of bowel wall thickening, distention, or inflammatory changes. Vascular/Lymphatic: Interval aortobifemoral stent grafting, suboptimally assessed in the absence of contrast. Advanced atherosclerosis of the native aorta. No abdominopelvic lymphadenopathy. Reproductive: Prostate is unremarkable. Other: No free fluid. No abdominopelvic fluid collection. No pneumoperitoneum. No abdominal wall hernia. Musculoskeletal: Chronic posttraumatic deformities of the left hemipelvis. Prior left femoral ORIF. No acute bony findings. IMPRESSION: 1. Abnormal  appearance of the distal esophagus which is circumferentially thickened and fluid-filled. Findings are suggestive of esophagitis. Further evaluation with upper endoscopy is recommended. 2. Fibrotic changes within the bilateral lung bases with bronchiectasis, progressed from prior study. Small superimposed opacity at the right lung base posteriorly may represent atelectasis or pneumonia. 3. Nonobstructing left nephrolithiasis. 4. Colonic diverticulosis without evidence for acute diverticulitis. 5. Interval aortobifemoral stent grafting, suboptimally assessed in the absence of contrast. 6. Aortic and coronary artery atherosclerosis (ICD10-I70.0). Electronically Signed   By: Davina Poke D.O.   On: 08/18/2022 16:17     Discharge Instructions: Discharge Instructions     Amb Referral to Palliative Care   Complete by: As directed    Call MD for:  persistant nausea and vomiting   Complete by: As directed    Call MD for:  temperature >100.4   Complete by: As directed    Diet - low sodium heart healthy   Complete by: As directed    Increase activity slowly   Complete by: As directed    No wound care   Complete by: As directed    Turn daily   No wound care   Complete by: As directed    Other Restrictions   Complete by: As directed    -For now, will be strict NWB LLE in CAM boot.  -Maintain boot at all times.      Mr. Nichlos Kunzler It was a pleasure taking care of you at [facility] were admitted for esophageal ulcer bleed.  We are discharging you home now that you are doing better. Please follow the following instructions.   1) Regarding your esophagitis and gastrointestinal ulcers, please continue to take your PPI 40 mg twice daily, Carafate, Maalox, famotidine, and lidocaine.  Continue to follow-up with [facility]. Plan is to wait for your acute inflammation to go down, and then follow with Dr. Leroy Johnson  2) Regarding your distal fibular fracture, please follow-up with  orthopedics.  I will send you home on oxycodone 5 mg as needed for pain. We have reached out to your orthopedic surgery team, who will either contact you or [name] to schedule a follow-up appointment outpatient.  3) Follow-up with [facility] rheumatology for your prednisone taper regarding your rheumatoid arthritis.  Continue taking 5 mg for the next month, then 4 mg for the next month, then 3 mg for the next month, then 2 mg for the next month, and finally 1 mg for the final month.  4) Please follow-up with your primary care physician for repeat hemoglobin check and electrolyte check including magnesium, phosphorus, and potassium  5) Continue taking all your other home medications as prescribed.  Take care,  Dr. Leigh Aurora, DO   Signed: Leigh Aurora, DO 08/27/2022, 2:08 PM   Pager: [phone]

## 2022-08-26 NOTE — Progress Notes (Addendum)
[facility] Progress Note  CC: Epigastric abdominal pain  Subjective: Patient was seen by GI earlier in this hospital stay for epigastric abdominal pain and upper GI bleed with hematemesis and black stools in the setting of Eliquis and Plavix.  He underwent an EGD on 8/24, findings as below.  - LA Grade D reflux esophagitis with no bleeding. Biopsied. - Benign-appearing esophageal stenosis. - Small hiatal hernia. - Otherwise normal stomach. Biopsied. - Non-bleeding duodenal ulcers, one with a flat pigmented spot (Forrest Class IIc) and the rest were clean based (Forrest Class III).  A. STOMACH, BIOPSY:  - Antral and oxyntic mucosa with no significant pathologic changes.  - No Helicobacter pylori identified.   B. ESOPHAGUS, BIOPSY:  - Atypical, partially necrotic infiltrate associated with abundant  inflammatory exudate and necrosis.  - See comment.  GI is being called to reevaluate him for ongoing complaints of burning epigastric abdominal pain that he rates as an 8 out of 10 on the pain scale.  He is on pantoprazole 40 mg twice daily, Carafate suspension 4 times a day, Pepcid daily, Maalox/Mylanta and viscous lidocaine (just started this AM).  Has extensive GI history at [facility].  Most recent note makes it sound like he just had a esophageal stricture that has required recurrent dilatation, but Dr. Candis Schatz looked much further into the chart and looks like he was diagnosed with achalasia, underwent a POEM procedure, etc.   Objective:  Vital signs in last 24 hours: Temp:  [97.5 F (36.4 C)-99.5 F (37.5 C)] 99.5 F (37.5 C) (08/31 0832) Pulse Rate:  [58-69] 69 (08/31 0832) Resp:  [18-19] 18 (08/31 0832) BP: (136-165)/(59-73) 146/73 (08/31 0832) SpO2:  [97 %-100 %] 97 % (08/31 0832) Last BM Date : 08/22/22 General:  Alert, Well-developed, in NAD Heart:  Regular rate and rhythm; no murmurs Pulm:  CTAB.  No W/R/R. Abdomen:  Soft, non-distended.  BS present.   Non-tender.  Extremities:  Without edema. Neurologic:  Alert and oriented x 4;  grossly normal neurologically. Psych:  Alert and cooperative. Normal mood and affect.  Intake/Output from previous day: 08/30 0701 - 08/31 0700 In: 360 [P.O.:360] Out: 1800 [Urine:1800] Intake/Output this shift: Total I/O In: -  Out: 800 [Urine:800]  Lab Results: Recent Labs    08/24/22 0105 08/26/22 0114  WBC 8.0 6.3  HGB 8.9* 9.1*  HCT 26.5* 27.4*  PLT 242 235   BMET Recent Labs    08/24/22 0105 08/26/22 0114  NA 137 139  K 3.9 4.0  CL 111 107  CO2 19* 25  GLUCOSE 108* 107*  BUN 12 7*  CREATININE 0.84 1.08  CALCIUM 7.7* 8.5*   Assessment / Plan: *76 year old male with complaints of burning epigastric abdominal pain: EGD on 8/24 - LA Grade D reflux esophagitis with no bleeding. Biopsied. - Benign-appearing esophageal stenosis. - Small hiatal hernia. - Otherwise normal stomach. Biopsied. - Non-bleeding duodenal ulcers, one with a flat pigmented spot (Forrest Class IIc) and the rest were clean based (Forrest Class III). He is on pantoprazole 40 mg twice daily, Carafate suspension 4 times a day, Pepcid daily, Maalox/Mylanta and viscous lidocaine (just started this AM).  Not sure what else to add.  Advised that it will take time for this to heal and he just needs more time of these medications.  Will follow-up with his GI at [facility].   LOS: 7 days   Laban Emperor. Zehr  08/26/2022, 12:29 PM   I have taken a history, reviewed  the chart and examined the patient. I performed a substantive portion of this encounter, including complete performance of at least one of the key components, in conjunction with the APP. I agree with the APP's note, impression and recommendations  76 year old male with history of severe esophagitis, dysphagia and esophageal stricture, followed extensively by [facility] who was admitted to [facility] with an upper GI bleed.  EGD this admission showed severe esophagitis, biopsies  negative for infection, but show features of necrosis and atypical cells.  He has had similar presentations at [facility] with LA Grade D esophagitis and history of bleeding duodenal ulcers and a Dieulafoy.  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  He had a GJ tube placed in 2022 with the intention of providing esophageal 'rest' to allow his esophagitis to heal with the hopes of proceeding with a POEM or Heller myotomy, but it appears the esophagitis did not heal, and neither of these therapies were performed.  Instead, it appears he has been undergoing serial dilations at [facility] with no esophagitis noted on his EGD in May.  His GJ tube was removed a few months ago and he has resumed a normal diet.  He reports having mild dysphagia, but also has been having some heartburn and acid reflux.  He told me he was not sure if he was taking his Protonix daily at the time of admission.  He reports ongoing epigastric burning pain, and it seems he is concerned that he will be going to a recovery facility and that his symptoms will not be adequately treated.  He has not showed any evidence of recurrent bleeding. I explained to him that he is on appropriate therapy for esophagitis and PUD with his BID PPI, QID Carafate and PRN MaaLox/viscous lidocaine.  He should strictly avoid all NSAIDs.  I stressed the importance of adherence to his PPI and carafate. I recommended he follow up with Dr. Leroy Sea at [facility] to see what other recommendations he has to prevent recurrent esophagitis/bleeding, and that he would need repeat EGD to assess healing. The patient seemed reassured by our conversation and plans to proceed with rehab and outpatient follow up with Dr. Leroy Sea.  No additional recommendations or changes to his medications at this time.   Tekeshia Klahr E. Candis Schatz, MD [facility]

## 2022-08-26 NOTE — Plan of Care (Signed)

## 2022-08-26 NOTE — Progress Notes (Signed)
Physical Therapy Treatment Patient Details Name: [name] MRN: [id number] DOB: [birth year] Today's Date: 08/26/2022   History of Present Illness 76 y.o. male presents to [facility] hospital on 08/18/2022 with a 2 week history of tarry stools and dark emesis. Pt also reports dizziness and 2 falls on the day of admission. Pt underwent upper GI endoscopy on 8/24. PMH includes DMII, HTN, RA.    PT Comments    Pt was seen for minimal mobility to chair and to walk 18' but did not feel safe going further as pt needed reminders to keep LLE completely clear from floor.  He is supported on both legs on chair with elevation of injury, but have given him a call light to avoid attempts to stand alone.  Pt voices understanding and will call nursing to return to bed.  Due to GI pain did not overly stress pt with further activity.  Follow up acutely for goals of PT as are outlined below.     Recommendations for follow up therapy are one component of a multi-disciplinary discharge planning process, led by the attending physician.  Recommendations may be updated based on patient status, additional functional criteria and insurance authorization.  Follow Up Recommendations  Skilled nursing-short term rehab (<3 hours/day) Can patient physically be transported by private vehicle: Yes   Assistance Recommended at Discharge Intermittent Supervision/Assistance  Patient can return home with the following A little help with walking and/or transfers;A little help with bathing/dressing/bathroom;Assistance with cooking/housework;Help with stairs or ramp for entrance;Assist for transportation   Equipment Recommendations  Rolling walker (2 wheels);BSC/3in1;Wheelchair (measurements PT);Wheelchair cushion (measurements PT)    Recommendations for Other Services       Precautions / Restrictions Precautions Precautions: Fall Precaution Comments: observe BP Restrictions Weight Bearing Restrictions: Yes LLE Weight  Bearing: Non weight bearing Other Position/Activity Restrictions: CAM walker boot at all times per ortho note     Mobility  Bed Mobility Overal bed mobility: Needs Assistance Bed Mobility: Supine to Sit     Supine to sit: Min guard     General bed mobility comments: able to maneuver to side of bed, motivated    Transfers Overall transfer level: Needs assistance Equipment used: Rolling walker (2 wheels) Transfers: Sit to/from Stand Sit to Stand: Min guard           General transfer comment: pt using bed to push off    Ambulation/Gait Ambulation/Gait assistance: Min guard Gait Distance (Feet): 18 Feet Assistive device: Rolling walker (2 wheels)   Gait velocity: reduced Gait velocity interpretation: <1.31 ft/sec, indicative of household ambulator   General Gait Details: hopping on RLE with occas touching on LLE and reminders to avoid wb   Stairs             Wheelchair Mobility    Modified Rankin (Stroke Patients Only)       Balance Overall balance assessment: Needs assistance, History of Falls Sitting-balance support: Feet supported Sitting balance-Leahy Scale: Good     Standing balance support: Bilateral upper extremity supported, During functional activity Standing balance-Leahy Scale: Poor                              Cognition Arousal/Alertness: Awake/alert Behavior During Therapy: WFL for tasks assessed/performed Overall Cognitive Status: Impaired/Different from baseline Area of Impairment: Safety/judgement, Awareness, Problem solving  Safety/Judgement: Decreased awareness of safety, Decreased awareness of deficits Awareness: Intellectual Problem Solving: Slow processing, Requires verbal cues General Comments: requires occas reminders of WB limits of the LLE        Exercises      General Comments General comments (skin integrity, edema, etc.): pt was seen for mobility on RW with help to  maneuver safely mainly by reminding him that LLE NWB      Pertinent Vitals/Pain Pain Assessment Pain Assessment: Faces Faces Pain Scale: Hurts a little bit Pain Location: upper abdomen Pain Descriptors / Indicators: Guarding    Home Living                          Prior Function            PT Goals (current goals can now be found in the care plan section) Acute Rehab PT Goals Patient Stated Goal: to go home, return to independence Progress towards PT goals: Progressing toward goals    Frequency    Min 3X/week      PT Plan Current plan remains appropriate    Co-evaluation              AM-PAC PT "6 Clicks" Mobility   Outcome Measure  Help needed turning from your back to your side while in a flat bed without using bedrails?: A Little Help needed moving from lying on your back to sitting on the side of a flat bed without using bedrails?: A Little Help needed moving to and from a bed to a chair (including a wheelchair)?: A Little Help needed standing up from a chair using your arms (e.g., wheelchair or bedside chair)?: A Little Help needed to walk in hospital room?: A Little Help needed climbing 3-5 steps with a railing? : Total 6 Click Score: 16    End of Session Equipment Utilized During Treatment: Gait belt Activity Tolerance: Patient tolerated treatment well Patient left: in chair;with call bell/phone within reach;with chair alarm set Nurse Communication: Mobility status PT Visit Diagnosis: Other abnormalities of gait and mobility (R26.89);Muscle weakness (generalized) (M62.81)     Time: 5462-7035 PT Time Calculation (min) (ACUTE ONLY): 22 min  Charges:  $Gait Training: 8-22 mins   Ramond Dial 08/26/2022, 3:11 PM  Mee Hives, PT PhD Acute Rehab Dept. Number: [place] and [place]

## 2022-08-26 NOTE — Progress Notes (Signed)
Occupational Therapy Treatment Patient Details Name: [name] MRN: [id number] DOB: [birth year] Today's Date: 08/26/2022   History of present illness 76 y.o. male presents to [facility] hospital on 08/18/2022 with a 2 week history of tarry stools and dark emesis. Pt also reports dizziness and 2 falls on the day of admission. Pt underwent upper GI endoscopy on 8/24. PMH includes DMII, HTN, RA.   OT comments  Session focused on further functional cognitive assessment with administration of pill box test. Pt with some difficulty translating directions from pill bottle labels to accurately fill out pill box resulting in > 3 errors and requiring > 5 min to complete test resulting in failing score. Encouraged pt to have someone assist with pt's med mgmt initially as prescribed medications likely changed.    Recommendations for follow up therapy are one component of a multi-disciplinary discharge planning process, led by the attending physician.  Recommendations may be updated based on patient status, additional functional criteria and insurance authorization.    Follow Up Recommendations  Skilled nursing-short term rehab (<3 hours/day)    Assistance Recommended at Discharge Intermittent Supervision/Assistance  Patient can return home with the following  A lot of help with walking and/or transfers;A lot of help with bathing/dressing/bathroom;Assistance with cooking/housework;Assistance with feeding;Assist for transportation   Equipment Recommendations  BSC/3in1;Other (comment) (RW)    Recommendations for Other Services      Precautions / Restrictions Precautions Precautions: Fall Precaution Comments: monitor BP, 2 falls prior to admission Restrictions Weight Bearing Restrictions: Yes LLE Weight Bearing: Non weight bearing Other Position/Activity Restrictions: CAM walker boot at all times per ortho note       Mobility Bed Mobility Overal bed mobility: Needs Assistance Bed Mobility:  Supine to Sit, Sit to Supine     Supine to sit: Min assist Sit to supine: Min assist   General bed mobility comments: requesting assist for LLE to/from EOB    Transfers                         Balance Overall balance assessment: Needs assistance Sitting-balance support: No upper extremity supported, Feet supported Sitting balance-Leahy Scale: Good                                     ADL either performed or assessed with clinical judgement   ADL Overall ADL's : Needs assistance/impaired                                       General ADL Comments: Emphasis on cognitive assessment with pill box test - see cognition section    Extremity/Trunk Assessment Upper Extremity Assessment Upper Extremity Assessment: Generalized weakness   Lower Extremity Assessment Lower Extremity Assessment: Defer to PT evaluation        Vision   Vision Assessment?: No apparent visual deficits   Perception     Praxis      Cognition Arousal/Alertness: Awake/alert Behavior During Therapy: WFL for tasks assessed/performed Overall Cognitive Status: Impaired/Different from baseline Area of Impairment: Safety/judgement, Awareness, Problem solving                         Safety/Judgement: Decreased awareness of safety, Decreased awareness of deficits Awareness: Emergent Problem Solving: Requires verbal cues General Comments: Pt with some  improvements in cognition though higher level executive functioning impaired with pill box test administration. Pt with difficulties accurately placing pills in sections that needed to be taken more than 1x/day, also had started re-filling sections with the same medications. Pt took 9.30 min to complete task resulting in fail >3 errors. Encouraged pt to have family assist with initial med mgmt at home as medications have likely changed after this admission        Exercises      Shoulder Instructions        General Comments GI PA in to answer pt questions    Pertinent Vitals/ Pain       Pain Assessment Pain Assessment: Faces Faces Pain Scale: Hurts little more Pain Location: upper abdomen Pain Descriptors / Indicators: Sore, Grimacing Pain Intervention(s): Monitored during session  Home Living                                          Prior Functioning/Environment              Frequency  Min 2X/week        Progress Toward Goals  OT Goals(current goals can now be found in the care plan section)  Progress towards OT goals: Progressing toward goals  Acute Rehab OT Goals Patient Stated Goal: resolve esophageal pain OT Goal Formulation: With patient Time For Goal Achievement: 09/03/22 Potential to Achieve Goals: Fair ADL Goals Pt Will Perform Lower Body Dressing: with mod assist;sit to/from stand Pt Will Transfer to Toilet: with mod assist;stand pivot transfer;bedside commode Additional ADL Goal #1: pt will complete basic transfer bed to chair or chair to bed min (A) with RW  Plan Discharge plan remains appropriate    Co-evaluation                 AM-PAC OT "6 Clicks" Daily Activity     Outcome Measure   Help from another person eating meals?: None Help from another person taking care of personal grooming?: A Little Help from another person toileting, which includes using toliet, bedpan, or urinal?: A Lot Help from another person bathing (including washing, rinsing, drying)?: A Lot Help from another person to put on and taking off regular upper body clothing?: A Little Help from another person to put on and taking off regular lower body clothing?: A Lot 6 Click Score: 16    End of Session    OT Visit Diagnosis: Unsteadiness on feet (R26.81);Muscle weakness (generalized) (M62.81)   Activity Tolerance Patient tolerated treatment well   Patient Left in bed;with call bell/phone within reach;with bed alarm set   Nurse Communication  Mobility status;Precautions;Weight bearing status        Time: 5053-9767 OT Time Calculation (min): 32 min  Charges: OT General Charges $OT Visit: 1 Visit OT Treatments $Self Care/Home Management : 8-22 mins $Therapeutic Activity: 8-22 mins  Malachy Chamber, OTR/L Acute Rehab Services Office: [phone]   [name] 08/26/2022, 2:51 PM

## 2022-08-27 DIAGNOSIS — K209 Esophagitis, unspecified without bleeding: Secondary | ICD-10-CM

## 2022-08-27 DIAGNOSIS — S8265XA Nondisplaced fracture of lateral malleolus of left fibula, initial encounter for closed fracture: Secondary | ICD-10-CM | POA: Diagnosis not present

## 2022-08-27 DIAGNOSIS — E119 Type 2 diabetes mellitus without complications: Secondary | ICD-10-CM | POA: Diagnosis not present

## 2022-08-27 DIAGNOSIS — K269 Duodenal ulcer, unspecified as acute or chronic, without hemorrhage or perforation: Secondary | ICD-10-CM | POA: Diagnosis not present

## 2022-08-27 LAB — PHOSPHORUS: Phosphorus: 3.1 mg/dL (ref 2.5–4.6)

## 2022-08-27 LAB — GLUCOSE, CAPILLARY
Glucose-Capillary: 122 mg/dL — ABNORMAL HIGH (ref 70–99)
Glucose-Capillary: 128 mg/dL — ABNORMAL HIGH (ref 70–99)

## 2022-08-27 LAB — MAGNESIUM: Magnesium: 1.5 mg/dL — ABNORMAL LOW (ref 1.7–2.4)

## 2022-08-27 MED ORDER — TADALAFIL 5 MG PO TABS: 10.0000 mg | ORAL_TABLET | Freq: Every day | ORAL | 0 refills | Status: DC

## 2022-08-27 MED ORDER — PANTOPRAZOLE SODIUM 40 MG PO TBEC: 40.0000 mg | DELAYED_RELEASE_TABLET | Freq: Two times a day (BID) | ORAL | Status: DC

## 2022-08-27 MED ORDER — METFORMIN HCL 500 MG PO TABS: 500.0000 mg | ORAL_TABLET | Freq: Every day | ORAL | 11 refills | Status: DC

## 2022-08-27 MED ORDER — FERROUS SULFATE 300 (60 FE) MG/5ML PO SYRP: 300.0000 mg | ORAL_SOLUTION | Freq: Every day | ORAL | 3 refills | Status: DC

## 2022-08-27 MED ORDER — DICLOFENAC SODIUM 1 % EX GEL
2.0000 g | Freq: Four times a day (QID) | CUTANEOUS | 0 refills | Status: DC
Start: 2022-08-27 — End: 2023-05-16

## 2022-08-27 MED ORDER — PREDNISONE 1 MG PO TABS: 1.0000 mg | ORAL_TABLET | ORAL | Status: DC

## 2022-08-27 MED ORDER — LIDOCAINE VISCOUS HCL 2 % MT SOLN: 15.0000 mL | Freq: Three times a day (TID) | OROMUCOSAL | 3 refills | Status: DC | PRN

## 2022-08-27 MED ORDER — OXYCODONE HCL 5 MG PO TABS
5.0000 mg | ORAL_TABLET | ORAL | 0 refills | Status: DC | PRN
Start: 2022-08-27 — End: 2023-01-28

## 2022-08-27 MED ORDER — MAGNESIUM SULFATE 2 GM/50ML IV SOLN
2.0000 g | Freq: Once | INTRAVENOUS | Status: AC
  Administered 2022-08-27: 2 g via INTRAVENOUS
  Filled 2022-08-27: qty 50

## 2022-08-27 MED ORDER — ATORVASTATIN CALCIUM 40 MG PO TABS: 40.0000 mg | ORAL_TABLET | Freq: Every day | ORAL | 11 refills | Status: DC

## 2022-08-27 MED ORDER — ONDANSETRON HCL 4 MG PO TABS: 4.0000 mg | ORAL_TABLET | Freq: Two times a day (BID) | ORAL | 0 refills | Status: DC | PRN

## 2022-08-27 MED ORDER — LEVOTHYROXINE SODIUM 50 MCG PO TABS: 50.0000 ug | ORAL_TABLET | Freq: Every day | ORAL | 11 refills | Status: DC

## 2022-08-27 MED ORDER — ALUM & MAG HYDROXIDE-SIMETH 200-200-20 MG/5ML PO SUSP: 30.0000 mL | Freq: Three times a day (TID) | ORAL | 0 refills | Status: DC | PRN

## 2022-08-27 MED ORDER — FOLIC ACID 1 MG PO TABS: 1.0000 mg | ORAL_TABLET | Freq: Every day | ORAL | 11 refills | Status: DC

## 2022-08-27 MED ORDER — MAGNESIUM OXIDE -MG SUPPLEMENT 400 (240 MG) MG PO TABS: 400.0000 mg | ORAL_TABLET | Freq: Every day | ORAL | 11 refills | Status: DC

## 2022-08-27 MED ORDER — FAMOTIDINE 20 MG PO TABS: 20.0000 mg | ORAL_TABLET | Freq: Every day | ORAL | 11 refills | Status: DC

## 2022-08-27 NOTE — Progress Notes (Signed)
PT Cancellation Note  Patient Details Name: [name] MRN: [id number] DOB: [birth year]   Cancelled Treatment:    Reason Eval/Treat Not Completed: Other (comment).  Politely declined PT for his visit today since he is leaving.  Follow up as time and pt allow.   Ramond Dial 08/27/2022, 2:14 PM  Mee Hives, PT PhD Acute Rehab Dept. Number: [place] and [place]

## 2022-08-27 NOTE — Plan of Care (Signed)
  Problem: Education: Goal: Knowledge of General Education information will improve Description: Including pain rating scale, medication(s)/side effects and non-pharmacologic comfort measures Outcome: Adequate for Discharge   Problem: Health Behavior/Discharge Planning: Goal: Ability to manage health-related needs will improve Outcome: Adequate for Discharge   Problem: Clinical Measurements: Goal: Ability to maintain clinical measurements within normal limits will improve Outcome: Adequate for Discharge Goal: Will remain free from infection Outcome: Adequate for Discharge Goal: Diagnostic test results will improve Outcome: Adequate for Discharge Goal: Respiratory complications will improve Outcome: Adequate for Discharge Goal: Cardiovascular complication will be avoided Outcome: Adequate for Discharge   Problem: Activity: Goal: Risk for activity intolerance will decrease Outcome: Adequate for Discharge   Problem: Nutrition: Goal: Adequate nutrition will be maintained Outcome: Adequate for Discharge   Problem: Coping: Goal: Level of anxiety will decrease Outcome: Adequate for Discharge   Problem: Elimination: Goal: Will not experience complications related to bowel motility Outcome: Adequate for Discharge Goal: Will not experience complications related to urinary retention Outcome: Adequate for Discharge   Problem: Pain Managment: Goal: General experience of comfort will improve Outcome: Adequate for Discharge   Problem: Safety: Goal: Ability to remain free from injury will improve Outcome: Adequate for Discharge   Problem: Skin Integrity: Goal: Risk for impaired skin integrity will decrease Outcome: Adequate for Discharge   Problem: Acute Rehab PT Goals(only PT should resolve) Goal: Pt Will Go Supine/Side To Sit Outcome: Adequate for Discharge Goal: Pt Will Go Sit To Supine/Side Outcome: Adequate for Discharge Goal: Patient Will Transfer Sit To/From  Stand Outcome: Adequate for Discharge Goal: Pt Will Transfer Bed To Chair/Chair To Bed Outcome: Adequate for Discharge Goal: Pt Will Ambulate Outcome: Adequate for Discharge   Problem: Education: Goal: Ability to describe self-care measures that may prevent or decrease complications (Diabetes Survival Skills Education) will improve Outcome: Adequate for Discharge Goal: Individualized Educational Video(s) Outcome: Adequate for Discharge   Problem: Coping: Goal: Ability to adjust to condition or change in health will improve Outcome: Adequate for Discharge   Problem: Fluid Volume: Goal: Ability to maintain a balanced intake and output will improve Outcome: Adequate for Discharge   Problem: Health Behavior/Discharge Planning: Goal: Ability to identify and utilize available resources and services will improve Outcome: Adequate for Discharge Goal: Ability to manage health-related needs will improve Outcome: Adequate for Discharge   Problem: Metabolic: Goal: Ability to maintain appropriate glucose levels will improve Outcome: Adequate for Discharge   Problem: Nutritional: Goal: Maintenance of adequate nutrition will improve Outcome: Adequate for Discharge Goal: Progress toward achieving an optimal weight will improve Outcome: Adequate for Discharge   Problem: Skin Integrity: Goal: Risk for impaired skin integrity will decrease Outcome: Adequate for Discharge   Problem: Tissue Perfusion: Goal: Adequacy of tissue perfusion will improve Outcome: Adequate for Discharge   Problem: Acute Rehab OT Goals (only OT should resolve) Goal: Pt. Will Perform Lower Body Dressing Outcome: Adequate for Discharge Goal: Pt. Will Transfer To Toilet Outcome: Adequate for Discharge Goal: OT Additional ADL Goal #1 Outcome: Adequate for Discharge   Problem: SLP Dysphagia Goals Goal: Patient will utilize recommended strategies Description: Patient will utilize recommended strategies during  swallow to increase swallowing safety with Outcome: Adequate for Discharge   Problem: Inadequate Protein-Energy Intake  (NI-5.3) Goal: Food and/or nutrient delivery Description: Individualized approach for food/nutrient provision. Outcome: Adequate for Discharge

## 2022-08-27 NOTE — Progress Notes (Signed)
Discharge instructions (including medications) discussed with and copy provided to patient/caregiver. Discharge instructions placed in discharge packet for receiving facility.

## 2022-08-27 NOTE — TOC Transition Note (Addendum)
Transition of Care [facility]) - CM/SW Discharge Note   Patient Details  Name: [name] MRN: [id number] Date of Birth: 1946-05-03  [facility] CM/SW Contact:  Tresa Endo Phone Number: 08/27/2022, 12:05 PM   Clinical Narrative:    Patient will DC to: [facility] Anticipated DC date: 08/27/2022 Family notified: Pt Son Transport by: Pt ex-wife   Per MD patient ready for DC to [facility]. RN to call report prior to discharge [phone]). RN, patient, patient's family, and facility notified of DC. Discharge Summary and FL2 sent to facility. DC packet on chart.   CSW will sign off for now as social work intervention is no longer needed. Please consult [place] again if new needs arise.       Barriers to Discharge: Continued Medical Work up, Ship broker   Patient Goals and CMS Choice Patient states their goals for this hospitalization and ongoing recovery are:: Get better soon CMS Medicare.gov Compare Post Acute Care list provided to:: Other (Comment Required) (Son, [name]) Choice offered to / list presented to : Adult Children  Discharge Placement                       Discharge Plan and Services In-house Referral: Clinical Social Work                                   Social Determinants of Health (SDOH) Interventions     Readmission Risk Interventions     No data to display

## 2022-08-31 ENCOUNTER — Telehealth: Payer: Self-pay

## 2022-08-31 NOTE — Telephone Encounter (Signed)
Note sent to Dr. Leroy Sea requesting an expedited f/u appt for pt.

## 2022-08-31 NOTE — Telephone Encounter (Signed)
-----   Message from Daryel November, MD sent at 08/27/2022 10:09 AM EDT ----- Regarding: Out patient follow up at [facility],  This patient has a complicated GI history and is followed by Dr. Leroy Sea at [facility].  He was admitted for an upper GI bleed from severe esophagitis and duodenal ulcer.  He is being discharged to a rehab facility but he asked [place] to contact Dr. Atlee Abide office and let them know about the admission and to try to get expedited outpatient follow up. Would you be able to reach out to his office and see if they can contact him and get him scheduled?  Thanks

## 2022-09-07 ENCOUNTER — Non-Acute Institutional Stay: Payer: Medicare Other | Admitting: Internal Medicine

## 2022-09-07 DIAGNOSIS — R1013 Epigastric pain: Secondary | ICD-10-CM

## 2022-09-07 DIAGNOSIS — S8265XA Nondisplaced fracture of lateral malleolus of left fibula, initial encounter for closed fracture: Secondary | ICD-10-CM

## 2022-09-07 DIAGNOSIS — K209 Esophagitis, unspecified without bleeding: Secondary | ICD-10-CM

## 2022-09-07 DIAGNOSIS — E43 Unspecified severe protein-calorie malnutrition: Secondary | ICD-10-CM

## 2022-09-07 DIAGNOSIS — Z515 Encounter for palliative care: Secondary | ICD-10-CM

## 2022-09-07 NOTE — Progress Notes (Signed)
[place] Consult Note Telephone: [phone]  Fax: [phone]   Date of encounter: 09/07/22 12:01 PM PATIENT NAME: [name] [address] [place] [address]   [phone] (home)  DOB: [birth year] MRN: [id number] PRIMARY CARE PROVIDER:    Christoper Allegra, MD--[facility]  REFERRING PROVIDER:   Elmore Guise, [place] [address] [place] [address] [phone]  RESPONSIBLE PARTY:    Contact Information     Name Relation Home Work Mobile   [place]   [phone]        I met face to face with patient and family in [facility]. Palliative Care was asked to follow this patient by consultation request of  Demarchi, Satira Anis, MD to address advance care planning and complex medical decision making. This is the initial visit.                                     ASSESSMENT AND PLAN / RECOMMENDATIONS:   Advance Care Planning/Goals of Care: Goals include to maximize quality of life and symptom management. Patient/health care surrogate gave his/her permission to discuss.Our advance care planning conversation included a discussion about:    The value and importance of advance care planning  Experiences with loved ones who have been seriously ill or have died  Exploration of personal, cultural or spiritual beliefs that might influence medical decisions  Exploration of goals of care in the event of a sudden injury or illness  Identification  of a healthcare agent  Review and updating or creation of an  advance directive document . Decision not to resuscitate or to de-escalate disease focused treatments due to poor prognosis. CODE STATUS: FULL CODE in SNF chart and at hospital--confirmed with him today.  He would like to be kept alive.  MOST form introduced and blank provided.  Pt plans to discuss with his son who is in [place].   He is thinking he would want a  feeding tube again if it's needed to allow his esophagus to heal once again. Of note, he's not done well when his prednisone has been weaned historically.  Symptom Management/Plan: 1. Acute esophagitis -addressed nausea/abd pain related to this by scheduling medications as not receiving prns--scheduled gerilanta tid, changed famotidine to liquid as pt struggled to take pills, added zofran 44m po tid before meals, scheduled viscous lidocaine tid  2. Abdominal pain, epigastric -changes as above, continue carafate also  3. Closed nondisplaced fracture of lateral malleolus of left fibula, initial encounter -in boot, f/u with ortho as planned, was not to bear weight  4. 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 Palliative care by specialist -wanted to complete MOST with him, but he was not ready yet to make these decisions, will need to f/u after he speaks with his son  Follow up Palliative Care Visit: Palliative care will continue to follow for complex medical decision making, advance care planning, and clarification of goals.    This visit was coded based on medical decision making (MDM). 32 mins spent on ACP  PPS:  40%  HOSPICE ELIGIBILITY/DIAGNOSIS: if goals were consistent/abnormal weight loss secondary to nutcracker esophagus  Chief Complaint: Initial palliative care consult  HISTORY OF PRESENT ILLNESS:  [name] is a 76 y.o. 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  His primary care and oncology teams are at [facility].  Hospital admission note indicates he was fully ADL and IADL independent living in an apt in [place].   During his admission, he was transfused 1 unit PRBCs with hgb up to 11.6.  eliquis and plavix were temporarily held pending EGD.  EGD revealed severe esophagitis with necrosis and atypical cells and duodenal ulcers treated with PPI, carafate, maalox, lidocaine, famotidine, but ongoing epigastric pain.  He is for f/u at [facility] and repeat EGD in 8 wks.  He also had a GJ tube x2  in the past to heal the esophagus, but it was not effective.  He was discharged on sq lovenox.  Prior to his admission, it turns out he actually fractured his left ankle (distal fibula weber B ankle fx) during the fall--he's in a CAM boot and nonweightbearing pending outpatient ortho f/u.  He was given oxycodone 66m tablets as needed for pain.  Albumin was 3.1 08/18/22. D/c hgb 9.1, BUN 7, cr 1.08 down from >130/2.8, phos 3.1, mg 1.5  When seen, he reported ongoing difficulty with nausea, poor appetite, and very little intake.  He notes he has not really been receiving prn medications for these symptoms.  Denies odynophagia. Path/micro not back from biopsy of esophagus when I checked to see if he had a specific yeast or fungus warranting different abx.  Weight here 170 lbs.  He denies depression, but has flat affect.    History obtained from review of EMR, discussion with primary team, and interview with family, facility staff/caregiver and/or Mr. MDambach   I reviewed available labs, medications, imaging, studies and related documents from the EMR.  Records reviewed  and summarized above.   ROS  Review of Systems  Constitutional:  Positive for activity change, appetite change, fatigue and unexpected weight change. Negative for chills and fever.  HENT:  Positive for trouble swallowing.   Eyes:  Negative for visual disturbance.  Respiratory:  Negative for chest tightness and shortness of breath.   Cardiovascular:  Negative for leg swelling.  Gastrointestinal:  Positive for abdominal pain, nausea and vomiting. Negative for constipation and diarrhea.  Genitourinary:  Negative for dysuria.  Musculoskeletal:  Positive for gait problem.       Ankle fx  Skin:  Positive for pallor.  Neurological:  Positive for weakness. Negative for dizziness.  Psychiatric/Behavioral:  Positive for dysphoric mood.     Physical Exam: There were no vitals filed for this visit. There is no height or weight on file to calculate BMI. Wt Readings from Last 500 Encounters:  08/19/22 180 lb (81.6 kg)  01/26/21 177 lb 7.5 oz (80.5 kg)  07/31/20 201 lb 6.2 oz (91.3 kg)  02/01/20 196 lb 3.4 oz (89 kg)  08/10/19 190 lb  4.1 oz (86.3 kg)  02/23/19 184 lb 8.4 oz (83.7 kg)  05/13/16 181 lb 12.8 oz (82.5 kg)  01/16/16 179 lb 10.8 oz (81.5 kg)  07/25/15 163 lb 14.6 oz (74.3 kg)  02/06/15 200 lb (90.7 kg)  07/23/14 207 lb (93.9 kg)  05/23/14 200 lb (90.7 kg)  08/10/13 199 lb (90.3 kg)  07/31/13 200 lb 6.4 oz (90.9 kg)  08/15/12 175 lb 14.4 oz (79.8 kg)   Physical Exam Constitutional:      Appearance: He is ill-appearing.     Comments: Frail, cachectic  HENT:     Head: Normocephalic and atraumatic.  Eyes:     Comments: glasses  Cardiovascular:     Rate and Rhythm: Normal rate and regular rhythm.  Pulmonary:     Effort: Pulmonary effort is normal.     Breath sounds: Normal breath sounds.  Abdominal:     General: Bowel sounds are normal.     Tenderness: There is no abdominal tenderness.  Musculoskeletal:        General: Normal range of motion.     Right lower leg: No  edema.     Left lower leg: No edema.  Skin:    General: Skin is warm and dry.  Neurological:     General: No focal deficit present.     Mental Status: He is alert and oriented to person, place, and time.  Psychiatric:     Comments: Flat affect     CURRENT PROBLEM LIST:  Patient Active Problem List   Diagnosis Date Noted   Acute esophagitis    Abdominal pain, epigastric    Closed left fibular fracture 08/22/2022   Reflux esophagitis 08/22/2022   Pressure injury of skin 08/20/2022   UGIB (upper gastrointestinal bleed) 08/18/2022   Pancytopenia ([place])    Immunosuppression ([place])    ILD (interstitial lung disease) ([place])    Diarrhea    DIC (disseminated intravascular coagulation) ([place])    AKI (acute kidney injury) ([place])    COVID-19 01/26/2021   Rheumatoid arthritis ([place]) 07/22/2015   Mucositis due to chemotherapy 06/25/2015   Mucositis due to radiation therapy 06/25/2015   SBO (small bowel obstruction) ([place]) 05/29/2015   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   H/O drug therapy 05/12/2015   Abnormal mental state 04/22/2015   Fast heart beat 04/22/2015   Febrile 04/18/2015   Candida esophagitis ([place]) 04/16/2015   Can't get food down 04/16/2015   Cancer of base of tongue ([place]) 04/02/2015   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   Fall 05/24/2014   Traumatic pneumothorax 05/23/2014   Diabetes mellitus, type 2 ([place]) 05/01/2014   Perirectal abscess 07/31/2013   Hemorrhoids 07/31/2013   Lentigines 11/13/2012   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   Atypical nevus 08/29/2012   Malignant melanoma in situ ([place]) 08/29/2012   Polypharmacy 08/23/2012   Dyspnea 08/14/2012   Rheumatoid arthritis(714.0) 08/14/2012   HTN (hypertension) 08/14/2012   GERD (gastroesophageal reflux disease) 08/14/2012   Emphysema 08/14/2012   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   Duodenal ulcer with hemorrhage 10/15/2011   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   Esophageal stricture 10/15/2011   PAST  MEDICAL HISTORY:  Active Ambulatory Problems    Diagnosis Date Noted   Dyspnea 08/14/2012   Rheumatoid arthritis(714.0) 08/14/2012   HTN (hypertension) 08/14/2012   GERD (gastroesophageal reflux disease) 08/14/2012   Emphysema 08/14/2012   Perirectal abscess 07/31/2013   Hemorrhoids 07/31/2013   Traumatic pneumothorax 05/23/2014   Fall 05/24/2014   Rheumatoid arthritis ([place]) 07/22/2015  Abnormal mental state 04/22/2015   Candida esophagitis ([place]) 04/16/2015   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   Duodenal ulcer with hemorrhage 10/15/2011   Atypical nevus 08/29/2012   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   Esophageal stricture 10/15/2011   Febrile 04/18/2015   Lentigines 11/13/2012   Polypharmacy 08/23/2012   Cancer of base of tongue ([place]) 04/02/2015   Malignant melanoma in situ ([place]) 08/29/2012   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   Mucositis due to chemotherapy 06/25/2015   Mucositis due to radiation therapy 06/25/2015   Can't get food down 04/16/2015   H/O drug therapy 05/12/2015   SBO (small bowel obstruction) ([place]) 05/29/2015   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   Fast heart beat 04/22/2015   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   Diabetes mellitus, type 2 ([place]) 05/01/2014   COVID-19 01/26/2021   AKI (acute kidney injury) ([place])    Pancytopenia ([place])    Immunosuppression ([place])    ILD (interstitial lung disease) ([place])    Diarrhea    DIC (disseminated intravascular coagulation) ([place])    UGIB (upper gastrointestinal bleed) 08/18/2022   Pressure injury of skin 08/20/2022   Closed left fibular fracture 08/22/2022   Reflux esophagitis 08/22/2022   Abdominal pain, epigastric    Acute esophagitis    Resolved Ambulatory Problems    Diagnosis Date Noted   Hematemesis with nausea    Upper GI bleed 08/19/2022   Past Medical History:  Diagnosis Date   Aortic aneurysm ([place]) 05/2017   Chronic back pain    Diabetes mellitus without complication (HCC)     Dyslipidemia    H/O hiatal hernia    Hypertension    Melanoma (HCC)    PONV (postoperative nausea and vomiting)    Spasm of esophagus    SOCIAL HX:  Social History   Tobacco Use   Smoking status: Former    Packs/day: 1.00    Years: 46.00    Total pack years: 46.00    Types: Cigarettes    Quit date: 11/26/2005    Years since quitting: 16.7   Smokeless tobacco: Never  Substance Use Topics   Alcohol use: Yes    Alcohol/week: 9.0 standard drinks of alcohol    Types: 9 Shots of liquor per week    Comment: 05/23/2014 "3, 1 shot drinks maybe 3 times/wk"   FAMILY HX:  Family History  Problem Relation Age of Onset   Heart disease Mother    Heart disease Father    Cancer Maternal Grandmother       ALLERGIES: No Known Allergies    PERTINENT MEDICATIONS:  MED PREDNISONE 1 MG TABLET: GIVE 1 TAB BY MOUTH DAILY FOR 30 DAYS. DeMarchi, Gwyndolyn Saxon 11/23/2022 9:00 AM MED PREDNISONE 1 MG TABLET: GIVE 2 TABS (2MG) BY MOUTH DAILY FOR 30 DAYS. DeMarchi, Gwyndolyn Saxon 10/25/2022 9:00 AM MED PREDNISONE 1 MG TABLET: GIVE 3 TABS (3MG) DAILY FOR ONE MONTH. DeMarchi, William 09/26/2022 9:00 AM MED ORENCIA 125 MG/ML SYRINGE INJECT 2ML (250MG) INTRAVENEOUSLY ONCE A MONTH DeMarchi, William 09/23/2022 9:00 AM MED METHOTREXATE 25 MG/ML VIAL; INJECT 15 MG (0.6 ML) SUB Q ONCE WEEKLY FOR RA. DeMarchi, William 09/02/2022 7:00 AM MED GERI-LANTA LIQUID TAKE 30 ML BY MOUTH THREE TIMES DAILY AS NEEDED FOR INDIGESTION OR HEARTBURN DeMarchi, William 08/31/2022 7:00 AM MED ENOXAPARIN 100 MG/ML SYRINGE: INJECT 40 MG SUB Q DAILY FOR DVT PROPHYLACTIC. DeMarchi, Gwyndolyn Saxon 08/28/2022 9:00 AM MED FAMOTIDINE 20 MG TABLET: GIVE 1 TABLET BY MOUTH DAILY FOR DUODENAL ULCER/ ESOPHAGITIS. DeMarchi, Gwyndolyn Saxon 08/28/2022 9:00 AM MED FERROUS  SULF 300 MG/5 ML CUP: GIVE 5ML BY MOUTH DAILY FOR UPPER GI BLEED. DeMarchi, Gwyndolyn Saxon 08/28/2022 9:00 AM MED PREDNISONE 1 MG TABLET: GIVE 4 TABS (4MG) BY MOUTH FOR 1  MONTH. DeMarchi, Gwyndolyn Saxon 08/28/2022 9:00 AM MED Constipation (3 of 4): If not relieved by Biscodyl suppository, give disposable Saline Enema rectally X 1 dose/24 hrs as needed (Do not use constipation standing orders for residents with renal failure/CFR less than 30. Contact MD for orders)(Physician Or Madison Hickman 08/27/2022 4:38 PM MED Constipation (1 of 4): If no BM in 3 days, give 30 cc Milk of Magnesium p.o. x 1 dose in 24 hours as needed (Do not use standing constipation orders for residents with renal failure CFR less than 30. Contact MD for orders) (Physician Order) Madison Hickman 08/27/2022 4:37 PM MED Constipation (2 of 4): If not relieved by MOM, give 10 mg Bisacodyl suppositiory rectally X 1 dose in 24 hours as needed (Do not use constipation standing orders for residents with renal failure/CFR less than 30. Contact MD for orders) (Physician Order) Madison Hickman 08/27/2022 4:37 PM MED TADALAFIL 10 MG TABLET: GIVE 1 TAB BY MOUTH DAILY FOR PULMONARY HTN. DeMarchi, Gwyndolyn Saxon 08/27/2022 4:09 PM MED SUCRALFATE 1 GM/10 ML SUSP: GIVE 1 GM (10ML) BY MOUTH FOUR TIMES DAILY FOR DUODENAL ULCER/ ESOPHAGITIS. DeMarchi, Gwyndolyn Saxon 08/27/2022 4:08 PM MED PANTOPRAZOLE SOD DR 40 MG TAB: GIVE 1 TAB BY MOUTH BID FOR GERD. DeMarchi, Gwyndolyn Saxon 08/27/2022 4:00 PM MED OXYCODONE HCL (IR) 5 MG CAP: GIVE 1 TAB BY MOUTH AS NEEDED EVERY 4 HOURS FOR MODERATE PAIN X14 DAYS. DeMarchi, Gwyndolyn Saxon 08/27/2022 3:54 PM MED ZOFRAN 4 MG TABLET: GIVE 1 TAB BY MOUTH EVERY 12 HOURS AS NEEDED FOR NAUSEA/ VOMITING. DeMarchi, Gwyndolyn Saxon 08/27/2022 3:53 PM MED METFORMIN HCL 500 MG TABLET: GIVE 1 TAB BY MOUTH DAILY FOR DM 2. DeMarchi, Gwyndolyn Saxon 08/27/2022 3:45 PM MED MAGNESIUM OXIDE 400 MG TABLET: GIVE 1 TAB BY MOUTH DAILY FOR SUPPLEMENT DeMarchi, Gwyndolyn Saxon 08/27/2022 3:44 PM MED LIDOCAINE 2% VISCOUS SOLN: GIVE 15ML BY MOUTH THREE TIMES DAILY AS NEEDED FOR MOUTH PAIN/ INDIGESTION. (SWISH AND SWALLOW) DeMarchi,  Gwyndolyn Saxon 08/27/2022 3:41 PM MED SYNTHROID 50 MCG TABLET: GIVE 1 TABLET BY MOUTH DAILY. DeMarchi, Gwyndolyn Saxon 86/28/2417 5:30 PM MED FOLIC ACID 1 MG TABLET: GIVE 1 TABLET BY MOUTH DAILY FOR SUPPLEMENT. DeMarchi, Gwyndolyn Saxon 08/27/2022 3:39 PM MED ATORVASTATIN 40 MG TABLET: GIVE 1 TAB BY MOUTH AT BEDTIME FOR HLD. DeMarchi, Gwyndolyn Saxon 08/27/2022 3:1   Thank you for the opportunity to participate in the care of Mr. Pennings.  The palliative care team will continue to follow. Please call our office at [phone] if we can be of additional assistance.   Hollace Kinnier, DO  COVID-19 PATIENT SCREENING TOOL Asked and negative response unless otherwise noted:  Have you had symptoms of covid, tested positive or been in contact with someone with symptoms/positive test in the past 5-10 days?  NO

## 2022-12-15 ENCOUNTER — Non-Acute Institutional Stay: Payer: Medicare Other | Admitting: Hospice

## 2022-12-15 DIAGNOSIS — R1314 Dysphagia, pharyngoesophageal phase: Secondary | ICD-10-CM

## 2022-12-15 DIAGNOSIS — Z515 Encounter for palliative care: Secondary | ICD-10-CM

## 2022-12-15 DIAGNOSIS — E43 Unspecified severe protein-calorie malnutrition: Secondary | ICD-10-CM

## 2022-12-15 DIAGNOSIS — K209 Esophagitis, unspecified without bleeding: Secondary | ICD-10-CM

## 2022-12-15 DIAGNOSIS — R1013 Epigastric pain: Secondary | ICD-10-CM

## 2022-12-15 NOTE — Progress Notes (Signed)
[facility] Consult Note Telephone: [phone]  Fax: [phone]   Date of encounter: 12/15/22 12:09 PM PATIENT NAME: [name] [address] [place] [address]   [phone] (home)  DOB: [birth year] MRN: [id number] PRIMARY CARE PROVIDER:    Christoper Allegra, MD--[facility]  REFERRING PROVIDER:   Elaina Pattee, MD No address on file N/A  RESPONSIBLE PARTY:    Contact Information     Name Relation Home Work Mobile   [name]   [phone]        I met face to face with patient and family in [facility]. Palliative Care was asked to follow this patient by consultation request of  Dennis Elk Melba Coon, MD to address advance care planning and complex medical decision making. Recent hospitalization  11/17 - 11/22/22 for hypoxia. This is the initial visit.                                     ASSESSMENT AND PLAN / RECOMMENDATIONS:   Advance Care Planning/Goals of Care: Goals include to maximize quality of life and symptom management. Patient/health care surrogate gave his/her permission to discuss.Our advance care planning conversation included a discussion about:    The value and importance of advance care planning  Experiences with loved ones who have been seriously ill or have died  Exploration of personal, cultural or spiritual beliefs that might influence medical decisions  Exploration of goals of care in the event of a sudden injury or illness  Identification  of a healthcare agent  Review and updating or creation of an  advance directive document . Decision not to resuscitate or to de-escalate disease focused treatments due to poor prognosis. CODE STATUS: FULL CODE in SNF chart and at hospital--confirmed with him today.   I spent 16  minutes providing this initial consultation. More than 50% of the time in this consultation was spent on counseling patient and  coordinating communication. --------------------------------------------------------------------------------------------------------------------------------------  Symptom Management/Plan: Esophagitis/dysphagia, pharyngoesophageal phase.  Nutrition via PEG tube continue omeprazole as ordered.  Aspiration precautions.  Abdominal pain, epigastric: Continue carafate as ordered.   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 Currently on Osmolite 1.5 via peg tube. Current weight 162.8 Ibs. Albumin was 3.1 08/18/22. D/c hgb 9.1, BUN 7, cr 1.08 down from >130/2.8, phos 3.1, mg 1.5. Routine CBC CMP.   Follow up Palliative Care Visit: Palliative care will continue to follow for complex medical decision making, advance care planning, and clarification of goals.     This visit was coded based on medical decision making (MDM).  PPS: 40%  HOSPICE ELIGIBILITY/DIAGNOSIS: if goals were consistent/abnormal weight loss secondary to nutcracker esophagus  Chief Complaint: Initial palliative care consult  HISTORY OF PRESENT ILLNESS:  [name] is a 76 y.o. year old male  with multiple morbidities requiring close monitoring, with high risk of complications and mortality: Dysphagia pharyngoesophageal phase, protein caloric malnutrition, epigastric pain.  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  History obtained from review of EMR, discussion with primary team, and interview with family, facility staff/caregiver and/or [name].   I reviewed available  labs, medications, imaging, studies and related documents from the EMR.  Records reviewed and summarized above.    Physical Exam: There were no vitals  filed for this visit. There is no height or weight on file to calculate BMI. Wt Readings from Last 500 Encounters:  08/19/22 180 lb (81.6 kg)  01/26/21 177 lb 7.5 oz (80.5 kg)  07/31/20 201 lb 6.2 oz (91.3 kg)  02/01/20 196 lb 3.4 oz (89 kg)  08/10/19 190 lb 4.1 oz (86.3 kg)  02/23/19 184 lb 8.4 oz (83.7 kg)  05/13/16 181 lb 12.8 oz (82.5 kg)  01/16/16 179 lb 10.8 oz (81.5 kg)  07/25/15 163 lb 14.6 oz (74.3 kg)  02/06/15 200 lb (90.7 kg)  07/23/14 207 lb (93.9 kg)  05/23/14 200 lb (90.7 kg)  08/10/13 199 lb (90.3 kg)  07/31/13 200 lb 6.4 oz (90.9 kg)  08/15/12 175 lb 14.4 oz (79.8 kg)     CURRENT PROBLEM LIST:  Patient Active Problem List   Diagnosis Date Noted   Acute esophagitis    Abdominal pain, epigastric    Closed left fibular fracture 08/22/2022   Reflux esophagitis 08/22/2022   Pressure injury of skin 08/20/2022   UGIB (upper gastrointestinal bleed) 08/18/2022   Pancytopenia ([place])    Immunosuppression ([place])    ILD (interstitial lung disease) ([place])    Diarrhea    DIC (disseminated intravascular coagulation) ([place])    AKI (acute kidney injury) ([place])    COVID-19 01/26/2021   Rheumatoid arthritis ([place]) 07/22/2015   Mucositis due to chemotherapy 06/25/2015   Mucositis due to radiation therapy 06/25/2015   SBO (small bowel obstruction) ([place]) 05/29/2015   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   H/O drug therapy 05/12/2015   Abnormal mental state 04/22/2015   Fast heart beat 04/22/2015   Febrile 04/18/2015   Candida esophagitis ([place]) 04/16/2015   Can't get food down 04/16/2015   Cancer of base of tongue ([place]) 04/02/2015   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[place]) 02/15/2015   Fall 05/24/2014   Traumatic pneumothorax 05/23/2014   Diabetes mellitus, type 2 ([place]) 05/01/2014   Perirectal abscess 07/31/2013   Hemorrhoids 07/31/2013   Lentigines 11/13/2012   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   Atypical nevus 08/29/2012   Malignant melanoma in situ ([place]) 08/29/2012    Polypharmacy 08/23/2012   Dyspnea 08/14/2012   Rheumatoid arthritis(714.0) 08/14/2012   HTN (hypertension) 08/14/2012   GERD (gastroesophageal reflux disease) 08/14/2012   Emphysema 08/14/2012   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   Duodenal ulcer with hemorrhage 10/15/2011   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   Esophageal stricture 10/15/2011   PAST MEDICAL HISTORY:  Active Ambulatory Problems    Diagnosis Date Noted   Dyspnea 08/14/2012   Rheumatoid arthritis(714.0) 08/14/2012   HTN (hypertension) 08/14/2012   GERD (gastroesophageal reflux disease) 08/14/2012   Emphysema 08/14/2012   Perirectal abscess 07/31/2013   Hemorrhoids 07/31/2013   Traumatic pneumothorax 05/23/2014   Fall 05/24/2014   Rheumatoid arthritis ([place]) 07/22/2015   Abnormal mental state 04/22/2015   Candida esophagitis ([place]) 04/16/2015   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   Duodenal ulcer with hemorrhage 10/15/2011   Atypical nevus 08/29/2012   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   Esophageal stricture 10/15/2011   Febrile 04/18/2015   Lentigines 11/13/2012   Polypharmacy 08/23/2012   Cancer of base of tongue ([place]) 04/02/2015   Malignant melanoma in situ ([place]) 08/29/2012   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[place]) 02/15/2015   Mucositis due to chemotherapy 06/25/2015   Mucositis due to radiation therapy 06/25/2015   Can't  get food down 04/16/2015   H/O drug therapy 05/12/2015   SBO (small bowel obstruction) ([place]) 05/29/2015   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   Fast heart beat 04/22/2015   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   Diabetes mellitus, type 2 ([place]) 05/01/2014   COVID-19 01/26/2021   AKI (acute kidney injury) ([place])    Pancytopenia ([place])    Immunosuppression ([place])    ILD (interstitial lung disease) ([place])    Diarrhea    DIC (disseminated intravascular coagulation) ([place])    UGIB (upper gastrointestinal bleed) 08/18/2022   Pressure injury of skin 08/20/2022   Closed left fibular fracture  08/22/2022   Reflux esophagitis 08/22/2022   Abdominal pain, epigastric    Acute esophagitis    Resolved Ambulatory Problems    Diagnosis Date Noted   Hematemesis with nausea    Upper GI bleed 08/19/2022   Past Medical History:  Diagnosis Date   Aortic aneurysm ([place]) 05/2017   Chronic back pain    Diabetes mellitus without complication (HCC)    Dyslipidemia    H/O hiatal hernia    Hypertension    Melanoma (HCC)    PONV (postoperative nausea and vomiting)    Spasm of esophagus    SOCIAL HX:  Social History   Tobacco Use   Smoking status: Former    Packs/day: 1.00    Years: 46.00    Total pack years: 46.00    Types: Cigarettes    Quit date: 11/26/2005    Years since quitting: 17.0   Smokeless tobacco: Never  Substance Use Topics   Alcohol use: Yes    Alcohol/week: 9.0 standard drinks of alcohol    Types: 9 Shots of liquor per week    Comment: 05/23/2014 "3, 1 shot drinks maybe 3 times/wk"   FAMILY HX:  Family History  Problem Relation Age of Onset   Heart disease Mother    Heart disease Father    Cancer Maternal Grandmother       ALLERGIES: No Known Allergies    PERTINENT MEDICATIONS:  MED PREDNISONE 1 MG TABLET: GIVE 1 TAB BY MOUTH DAILY FOR 30 DAYS. DeMarchi, Gwyndolyn Saxon 11/23/2022 9:00 AM MED PREDNISONE 1 MG TABLET: GIVE 2 TABS (2MG) BY MOUTH DAILY FOR 30 DAYS. DeMarchi, Gwyndolyn Saxon 10/25/2022 9:00 AM MED PREDNISONE 1 MG TABLET: GIVE 3 TABS (3MG) DAILY FOR ONE MONTH. DeMarchi, William 09/26/2022 9:00 AM MED ORENCIA 125 MG/ML SYRINGE INJECT 2ML (250MG) INTRAVENEOUSLY ONCE A MONTH DeMarchi, William 09/23/2022 9:00 AM MED METHOTREXATE 25 MG/ML VIAL; INJECT 15 MG (0.6 ML) SUB Q ONCE WEEKLY FOR RA. DeMarchi, William 09/02/2022 7:00 AM MED GERI-LANTA LIQUID TAKE 30 ML BY MOUTH THREE TIMES DAILY AS NEEDED FOR INDIGESTION OR HEARTBURN DeMarchi, William 08/31/2022 7:00 AM MED ENOXAPARIN 100 MG/ML SYRINGE: INJECT 40 MG SUB Q DAILY FOR DVT  PROPHYLACTIC. DeMarchi, Gwyndolyn Saxon 08/28/2022 9:00 AM MED FAMOTIDINE 20 MG TABLET: GIVE 1 TABLET BY MOUTH DAILY FOR DUODENAL ULCER/ ESOPHAGITIS. DeMarchi, Gwyndolyn Saxon 08/28/2022 9:00 AM MED FERROUS SULF 300 MG/5 ML CUP: GIVE 5ML BY MOUTH DAILY FOR UPPER GI BLEED. DeMarchi, Gwyndolyn Saxon 08/28/2022 9:00 AM MED PREDNISONE 1 MG TABLET: GIVE 4 TABS (4MG) BY MOUTH FOR 1 MONTH. DeMarchi, Gwyndolyn Saxon 08/28/2022 9:00 AM MED Constipation (3 of 4): If not relieved by Biscodyl suppository, give disposable Saline Enema rectally X 1 dose/24 hrs as needed (Do not use constipation standing orders for residents with renal failure/CFR less than 30. Contact MD for orders)(Physician Or Madison Hickman 08/27/2022 4:38 PM MED Constipation (1 of 4): If no BM  in 3 days, give 30 cc Milk of Magnesium p.o. x 1 dose in 24 hours as needed (Do not use standing constipation orders for residents with renal failure CFR less than 30. Contact MD for orders) (Physician Order) Madison Hickman 08/27/2022 4:37 PM MED Constipation (2 of 4): If not relieved by MOM, give 10 mg Bisacodyl suppositiory rectally X 1 dose in 24 hours as needed (Do not use constipation standing orders for residents with renal failure/CFR less than 30. Contact MD for orders) (Physician Order) Madison Hickman 08/27/2022 4:37 PM MED TADALAFIL 10 MG TABLET: GIVE 1 TAB BY MOUTH DAILY FOR PULMONARY HTN. DeMarchi, Gwyndolyn Saxon 08/27/2022 4:09 PM MED SUCRALFATE 1 GM/10 ML SUSP: GIVE 1 GM (10ML) BY MOUTH FOUR TIMES DAILY FOR DUODENAL ULCER/ ESOPHAGITIS. DeMarchi, Gwyndolyn Saxon 08/27/2022 4:08 PM MED PANTOPRAZOLE SOD DR 40 MG TAB: GIVE 1 TAB BY MOUTH BID FOR GERD. DeMarchi, Gwyndolyn Saxon 08/27/2022 4:00 PM MED OXYCODONE HCL (IR) 5 MG CAP: GIVE 1 TAB BY MOUTH AS NEEDED EVERY 4 HOURS FOR MODERATE PAIN X14 DAYS. DeMarchi, Gwyndolyn Saxon 08/27/2022 3:54 PM MED ZOFRAN 4 MG TABLET: GIVE 1 TAB BY MOUTH EVERY 12 HOURS AS NEEDED FOR NAUSEA/ VOMITING. DeMarchi, Gwyndolyn Saxon 08/27/2022 3:53  PM MED METFORMIN HCL 500 MG TABLET: GIVE 1 TAB BY MOUTH DAILY FOR DM 2. DeMarchi, Gwyndolyn Saxon 08/27/2022 3:45 PM MED MAGNESIUM OXIDE 400 MG TABLET: GIVE 1 TAB BY MOUTH DAILY FOR SUPPLEMENT DeMarchi, Gwyndolyn Saxon 08/27/2022 3:44 PM MED LIDOCAINE 2% VISCOUS SOLN: GIVE 15ML BY MOUTH THREE TIMES DAILY AS NEEDED FOR MOUTH PAIN/ INDIGESTION. (SWISH AND SWALLOW) DeMarchi, Gwyndolyn Saxon 08/27/2022 3:41 PM MED SYNTHROID 50 MCG TABLET: GIVE 1 TABLET BY MOUTH DAILY. DeMarchi, Gwyndolyn Saxon 37/35/7897 8:47 PM MED FOLIC ACID 1 MG TABLET: GIVE 1 TABLET BY MOUTH DAILY FOR SUPPLEMENT. DeMarchi, Gwyndolyn Saxon 08/27/2022 3:39 PM MED ATORVASTATIN 40 MG TABLET: GIVE 1 TAB BY MOUTH AT BEDTIME FOR HLD. DeMarchi, Gwyndolyn Saxon 08/27/2022 3:1   Thank you for the opportunity to participate in the care of Mr. Angerer.  The palliative care team will continue to follow. Please call our office at [phone] if we can be of additional assistance.   Teodoro Spray, NP

## 2022-12-27 ENCOUNTER — Other Ambulatory Visit: Payer: Self-pay

## 2022-12-27 ENCOUNTER — Emergency Department (HOSPITAL_COMMUNITY): Payer: Medicare Other

## 2022-12-27 ENCOUNTER — Inpatient Hospital Stay (HOSPITAL_COMMUNITY): Payer: Medicare Other

## 2022-12-27 ENCOUNTER — Encounter (HOSPITAL_COMMUNITY): Payer: Self-pay | Admitting: Emergency Medicine

## 2022-12-27 ENCOUNTER — Inpatient Hospital Stay (HOSPITAL_COMMUNITY)
Admission: EM | Admit: 2022-12-27 | Discharge: 2023-01-06 | DRG: 023 | Disposition: A | Discharge status: Rehabilitation Facility | Payer: Medicare Other | Attending: Internal Medicine | Admitting: Internal Medicine

## 2022-12-27 DIAGNOSIS — I700 Atherosclerosis of aorta: Secondary | ICD-10-CM | POA: Diagnosis present

## 2022-12-27 DIAGNOSIS — E43 Unspecified severe protein-calorie malnutrition: Secondary | ICD-10-CM | POA: Diagnosis present

## 2022-12-27 DIAGNOSIS — I10 Essential (primary) hypertension: Secondary | ICD-10-CM | POA: Diagnosis present

## 2022-12-27 DIAGNOSIS — M1811 Unilateral primary osteoarthritis of first carpometacarpal joint, right hand: Secondary | ICD-10-CM | POA: Diagnosis present

## 2022-12-27 DIAGNOSIS — Z789 Other specified health status: Secondary | ICD-10-CM | POA: Diagnosis not present

## 2022-12-27 DIAGNOSIS — M21331 Wrist drop, right wrist: Secondary | ICD-10-CM | POA: Diagnosis present

## 2022-12-27 DIAGNOSIS — G459 Transient cerebral ischemic attack, unspecified: Secondary | ICD-10-CM | POA: Diagnosis not present

## 2022-12-27 DIAGNOSIS — J449 Chronic obstructive pulmonary disease, unspecified: Secondary | ICD-10-CM | POA: Diagnosis not present

## 2022-12-27 DIAGNOSIS — J690 Pneumonitis due to inhalation of food and vomit: Secondary | ICD-10-CM | POA: Diagnosis present

## 2022-12-27 DIAGNOSIS — C029 Malignant neoplasm of tongue, unspecified: Secondary | ICD-10-CM | POA: Diagnosis present

## 2022-12-27 DIAGNOSIS — J111 Influenza due to unidentified influenza virus with other respiratory manifestations: Secondary | ICD-10-CM | POA: Diagnosis not present

## 2022-12-27 DIAGNOSIS — R6520 Severe sepsis without septic shock: Secondary | ICD-10-CM | POA: Diagnosis present

## 2022-12-27 DIAGNOSIS — Z87891 Personal history of nicotine dependence: Secondary | ICD-10-CM | POA: Diagnosis not present

## 2022-12-27 DIAGNOSIS — M069 Rheumatoid arthritis, unspecified: Secondary | ICD-10-CM | POA: Diagnosis present

## 2022-12-27 DIAGNOSIS — Z85818 Personal history of malignant neoplasm of other sites of lip, oral cavity, and pharynx: Secondary | ICD-10-CM

## 2022-12-27 DIAGNOSIS — Z79890 Hormone replacement therapy (postmenopausal): Secondary | ICD-10-CM

## 2022-12-27 DIAGNOSIS — E785 Hyperlipidemia, unspecified: Secondary | ICD-10-CM | POA: Diagnosis present

## 2022-12-27 DIAGNOSIS — I080 Rheumatic disorders of both mitral and aortic valves: Secondary | ICD-10-CM | POA: Diagnosis present

## 2022-12-27 DIAGNOSIS — E2740 Unspecified adrenocortical insufficiency: Secondary | ICD-10-CM | POA: Diagnosis present

## 2022-12-27 DIAGNOSIS — I63312 Cerebral infarction due to thrombosis of left middle cerebral artery: Secondary | ICD-10-CM | POA: Diagnosis present

## 2022-12-27 DIAGNOSIS — Z85820 Personal history of malignant melanoma of skin: Secondary | ICD-10-CM

## 2022-12-27 DIAGNOSIS — R4701 Aphasia: Secondary | ICD-10-CM | POA: Diagnosis present

## 2022-12-27 DIAGNOSIS — E876 Hypokalemia: Secondary | ICD-10-CM | POA: Diagnosis present

## 2022-12-27 DIAGNOSIS — R5381 Other malaise: Secondary | ICD-10-CM | POA: Diagnosis present

## 2022-12-27 DIAGNOSIS — D696 Thrombocytopenia, unspecified: Secondary | ICD-10-CM | POA: Diagnosis present

## 2022-12-27 DIAGNOSIS — Z7189 Other specified counseling: Secondary | ICD-10-CM | POA: Diagnosis not present

## 2022-12-27 DIAGNOSIS — I69320 Aphasia following cerebral infarction: Secondary | ICD-10-CM | POA: Diagnosis not present

## 2022-12-27 DIAGNOSIS — G9340 Encephalopathy, unspecified: Secondary | ICD-10-CM | POA: Diagnosis not present

## 2022-12-27 DIAGNOSIS — A419 Sepsis, unspecified organism: Principal | ICD-10-CM

## 2022-12-27 DIAGNOSIS — Z7902 Long term (current) use of antithrombotics/antiplatelets: Secondary | ICD-10-CM | POA: Diagnosis not present

## 2022-12-27 DIAGNOSIS — R32 Unspecified urinary incontinence: Secondary | ICD-10-CM | POA: Diagnosis not present

## 2022-12-27 DIAGNOSIS — R410 Disorientation, unspecified: Secondary | ICD-10-CM

## 2022-12-27 DIAGNOSIS — Z1152 Encounter for screening for COVID-19: Secondary | ICD-10-CM

## 2022-12-27 DIAGNOSIS — E872 Acidosis, unspecified: Secondary | ICD-10-CM | POA: Diagnosis present

## 2022-12-27 DIAGNOSIS — D62 Acute posthemorrhagic anemia: Secondary | ICD-10-CM | POA: Diagnosis not present

## 2022-12-27 DIAGNOSIS — E119 Type 2 diabetes mellitus without complications: Secondary | ICD-10-CM | POA: Diagnosis present

## 2022-12-27 DIAGNOSIS — M059 Rheumatoid arthritis with rheumatoid factor, unspecified: Secondary | ICD-10-CM | POA: Diagnosis present

## 2022-12-27 DIAGNOSIS — I6523 Occlusion and stenosis of bilateral carotid arteries: Secondary | ICD-10-CM | POA: Diagnosis present

## 2022-12-27 DIAGNOSIS — E039 Hypothyroidism, unspecified: Secondary | ICD-10-CM | POA: Diagnosis present

## 2022-12-27 DIAGNOSIS — K920 Hematemesis: Secondary | ICD-10-CM | POA: Diagnosis not present

## 2022-12-27 DIAGNOSIS — R159 Full incontinence of feces: Secondary | ICD-10-CM | POA: Diagnosis not present

## 2022-12-27 DIAGNOSIS — R531 Weakness: Secondary | ICD-10-CM | POA: Diagnosis present

## 2022-12-27 DIAGNOSIS — I63032 Cerebral infarction due to thrombosis of left carotid artery: Secondary | ICD-10-CM | POA: Diagnosis not present

## 2022-12-27 DIAGNOSIS — Z6820 Body mass index (BMI) 20.0-20.9, adult: Secondary | ICD-10-CM | POA: Diagnosis not present

## 2022-12-27 DIAGNOSIS — E1151 Type 2 diabetes mellitus with diabetic peripheral angiopathy without gangrene: Secondary | ICD-10-CM | POA: Diagnosis present

## 2022-12-27 DIAGNOSIS — J849 Interstitial pulmonary disease, unspecified: Secondary | ICD-10-CM | POA: Diagnosis present

## 2022-12-27 DIAGNOSIS — Z931 Gastrostomy status: Secondary | ICD-10-CM

## 2022-12-27 DIAGNOSIS — I639 Cerebral infarction, unspecified: Secondary | ICD-10-CM | POA: Diagnosis not present

## 2022-12-27 DIAGNOSIS — K922 Gastrointestinal hemorrhage, unspecified: Secondary | ICD-10-CM | POA: Diagnosis not present

## 2022-12-27 DIAGNOSIS — K219 Gastro-esophageal reflux disease without esophagitis: Secondary | ICD-10-CM | POA: Diagnosis present

## 2022-12-27 DIAGNOSIS — D649 Anemia, unspecified: Secondary | ICD-10-CM | POA: Diagnosis present

## 2022-12-27 DIAGNOSIS — Z7984 Long term (current) use of oral hypoglycemic drugs: Secondary | ICD-10-CM

## 2022-12-27 DIAGNOSIS — J8410 Pulmonary fibrosis, unspecified: Secondary | ICD-10-CM | POA: Diagnosis present

## 2022-12-27 DIAGNOSIS — I63232 Cerebral infarction due to unspecified occlusion or stenosis of left carotid arteries: Secondary | ICD-10-CM | POA: Diagnosis not present

## 2022-12-27 DIAGNOSIS — K921 Melena: Secondary | ICD-10-CM | POA: Diagnosis not present

## 2022-12-27 DIAGNOSIS — Z79899 Other long term (current) drug therapy: Secondary | ICD-10-CM

## 2022-12-27 DIAGNOSIS — F1090 Alcohol use, unspecified, uncomplicated: Secondary | ICD-10-CM | POA: Diagnosis present

## 2022-12-27 DIAGNOSIS — Z86711 Personal history of pulmonary embolism: Secondary | ICD-10-CM

## 2022-12-27 DIAGNOSIS — D84821 Immunodeficiency due to drugs: Secondary | ICD-10-CM | POA: Diagnosis present

## 2022-12-27 DIAGNOSIS — Z9049 Acquired absence of other specified parts of digestive tract: Secondary | ICD-10-CM

## 2022-12-27 DIAGNOSIS — G8191 Hemiplegia, unspecified affecting right dominant side: Secondary | ICD-10-CM | POA: Diagnosis present

## 2022-12-27 DIAGNOSIS — G9341 Metabolic encephalopathy: Secondary | ICD-10-CM | POA: Diagnosis present

## 2022-12-27 DIAGNOSIS — K264 Chronic or unspecified duodenal ulcer with hemorrhage: Secondary | ICD-10-CM | POA: Diagnosis not present

## 2022-12-27 DIAGNOSIS — I63132 Cerebral infarction due to embolism of left carotid artery: Secondary | ICD-10-CM | POA: Diagnosis not present

## 2022-12-27 DIAGNOSIS — Z8249 Family history of ischemic heart disease and other diseases of the circulatory system: Secondary | ICD-10-CM

## 2022-12-27 DIAGNOSIS — R471 Dysarthria and anarthria: Secondary | ICD-10-CM | POA: Diagnosis present

## 2022-12-27 DIAGNOSIS — I6389 Other cerebral infarction: Secondary | ICD-10-CM | POA: Diagnosis not present

## 2022-12-27 DIAGNOSIS — Z7952 Long term (current) use of systemic steroids: Secondary | ICD-10-CM

## 2022-12-27 DIAGNOSIS — R29706 NIHSS score 6: Secondary | ICD-10-CM | POA: Diagnosis present

## 2022-12-27 DIAGNOSIS — J101 Influenza due to other identified influenza virus with other respiratory manifestations: Secondary | ICD-10-CM

## 2022-12-27 DIAGNOSIS — K209 Esophagitis, unspecified without bleeding: Secondary | ICD-10-CM | POA: Diagnosis present

## 2022-12-27 DIAGNOSIS — Z86718 Personal history of other venous thrombosis and embolism: Secondary | ICD-10-CM

## 2022-12-27 DIAGNOSIS — Z6822 Body mass index (BMI) 22.0-22.9, adult: Secondary | ICD-10-CM

## 2022-12-27 DIAGNOSIS — A4189 Other specified sepsis: Secondary | ICD-10-CM | POA: Diagnosis present

## 2022-12-27 DIAGNOSIS — K222 Esophageal obstruction: Secondary | ICD-10-CM | POA: Diagnosis present

## 2022-12-27 DIAGNOSIS — Z809 Family history of malignant neoplasm, unspecified: Secondary | ICD-10-CM

## 2022-12-27 DIAGNOSIS — Z923 Personal history of irradiation: Secondary | ICD-10-CM | POA: Diagnosis not present

## 2022-12-27 DIAGNOSIS — Z515 Encounter for palliative care: Secondary | ICD-10-CM | POA: Diagnosis not present

## 2022-12-27 DIAGNOSIS — Z9221 Personal history of antineoplastic chemotherapy: Secondary | ICD-10-CM | POA: Diagnosis not present

## 2022-12-27 DIAGNOSIS — I63512 Cerebral infarction due to unspecified occlusion or stenosis of left middle cerebral artery: Secondary | ICD-10-CM | POA: Diagnosis not present

## 2022-12-27 DIAGNOSIS — Z85819 Personal history of malignant neoplasm of unspecified site of lip, oral cavity, and pharynx: Secondary | ICD-10-CM | POA: Diagnosis not present

## 2022-12-27 DIAGNOSIS — Z8673 Personal history of transient ischemic attack (TIA), and cerebral infarction without residual deficits: Secondary | ICD-10-CM

## 2022-12-27 DIAGNOSIS — R1314 Dysphagia, pharyngoesophageal phase: Secondary | ICD-10-CM | POA: Diagnosis present

## 2022-12-27 DIAGNOSIS — R4189 Other symptoms and signs involving cognitive functions and awareness: Secondary | ICD-10-CM | POA: Diagnosis not present

## 2022-12-27 DIAGNOSIS — N179 Acute kidney failure, unspecified: Secondary | ICD-10-CM | POA: Diagnosis present

## 2022-12-27 DIAGNOSIS — I6503 Occlusion and stenosis of bilateral vertebral arteries: Secondary | ICD-10-CM | POA: Diagnosis present

## 2022-12-27 DIAGNOSIS — I959 Hypotension, unspecified: Secondary | ICD-10-CM | POA: Diagnosis not present

## 2022-12-27 LAB — CBC
HCT: 31.6 % — ABNORMAL LOW (ref 39.0–52.0)
Hemoglobin: 10.3 g/dL — ABNORMAL LOW (ref 13.0–17.0)
MCH: 28.9 pg (ref 26.0–34.0)
MCHC: 32.6 g/dL (ref 30.0–36.0)
MCV: 88.8 fL (ref 80.0–100.0)
Platelets: 187 10*3/uL (ref 150–400)
RBC: 3.56 MIL/uL — ABNORMAL LOW (ref 4.22–5.81)
RDW: 17.9 % — ABNORMAL HIGH (ref 11.5–15.5)
WBC: 8.2 10*3/uL (ref 4.0–10.5)
nRBC: 0 % (ref 0.0–0.2)

## 2022-12-27 LAB — COMPREHENSIVE METABOLIC PANEL
ALT: 18 U/L (ref 0–44)
AST: 34 U/L (ref 15–41)
Albumin: 3.2 g/dL — ABNORMAL LOW (ref 3.5–5.0)
Alkaline Phosphatase: 84 U/L (ref 38–126)
Anion gap: 11 (ref 5–15)
BUN: 26 mg/dL — ABNORMAL HIGH (ref 8–23)
CO2: 24 mmol/L (ref 22–32)
Calcium: 8.7 mg/dL — ABNORMAL LOW (ref 8.9–10.3)
Chloride: 100 mmol/L (ref 98–111)
Creatinine, Ser: 1.14 mg/dL (ref 0.61–1.24)
GFR, Estimated: >60 mL/min (ref >60)
Glucose, Bld: 160 mg/dL — ABNORMAL HIGH (ref 70–99)
Potassium: 4.1 mmol/L (ref 3.5–5.1)
Sodium: 135 mmol/L (ref 135–145)
Total Bilirubin: 0.6 mg/dL (ref 0.3–1.2)
Total Protein: 6.2 g/dL — ABNORMAL LOW (ref 6.5–8.1)

## 2022-12-27 LAB — URINALYSIS, ROUTINE W REFLEX MICROSCOPIC
Bacteria, UA: NONE SEEN
Bilirubin Urine: NEGATIVE
Glucose, UA: NEGATIVE mg/dL
Ketones, ur: NEGATIVE mg/dL
Leukocytes,Ua: NEGATIVE
Nitrite: NEGATIVE
Protein, ur: NEGATIVE mg/dL
Specific Gravity, Urine: 1.042 — ABNORMAL HIGH (ref 1.005–1.030)
pH: 7 (ref 5.0–8.0)

## 2022-12-27 LAB — DIFFERENTIAL
Abs Immature Granulocytes: 0.03 10*3/uL (ref 0.00–0.07)
Basophils Absolute: 0 10*3/uL (ref 0.0–0.1)
Basophils Relative: 0 %
Eosinophils Absolute: 0 10*3/uL (ref 0.0–0.5)
Eosinophils Relative: 0 %
Immature Granulocytes: 0 %
Lymphocytes Relative: 4 %
Lymphs Abs: 0.4 10*3/uL — ABNORMAL LOW (ref 0.7–4.0)
Monocytes Absolute: 1.2 10*3/uL — ABNORMAL HIGH (ref 0.1–1.0)
Monocytes Relative: 14 %
Neutro Abs: 6.6 10*3/uL (ref 1.7–7.7)
Neutrophils Relative %: 82 %

## 2022-12-27 LAB — I-STAT CHEM 8, ED
BUN: 27 mg/dL — ABNORMAL HIGH (ref 8–23)
Calcium, Ion: 1.12 mmol/L — ABNORMAL LOW (ref 1.15–1.40)
Chloride: 99 mmol/L (ref 98–111)
Creatinine, Ser: 0.9 mg/dL (ref 0.61–1.24)
Glucose, Bld: 157 mg/dL — ABNORMAL HIGH (ref 70–99)
HCT: 34 % — ABNORMAL LOW (ref 39.0–52.0)
Hemoglobin: 11.6 g/dL — ABNORMAL LOW (ref 13.0–17.0)
Potassium: 4.2 mmol/L (ref 3.5–5.1)
Sodium: 137 mmol/L (ref 135–145)
TCO2: 23 mmol/L (ref 22–32)

## 2022-12-27 LAB — LACTIC ACID, PLASMA
Lactic Acid, Venous: 2.9 mmol/L (ref 0.5–1.9)
Lactic Acid, Venous: 2.1 mmol/L (ref 0.5–1.9)
Lactic Acid, Venous: 5.5 mmol/L (ref 0.5–1.9)
Lactic Acid, Venous: 2.8 mmol/L (ref 0.5–1.9)

## 2022-12-27 LAB — APTT: aPTT: 33 seconds (ref 24–36)

## 2022-12-27 LAB — RESP PANEL BY RT-PCR (RSV, FLU A&B, COVID)  RVPGX2
Influenza A by PCR: POSITIVE — AB
Influenza B by PCR: NEGATIVE
Resp Syncytial Virus by PCR: NEGATIVE
SARS Coronavirus 2 by RT PCR: NEGATIVE

## 2022-12-27 LAB — ETHANOL: Alcohol, Ethyl (B): <10 mg/dL (ref <10)

## 2022-12-27 LAB — CBG MONITORING, ED: Glucose-Capillary: 168 mg/dL — ABNORMAL HIGH (ref 70–99)

## 2022-12-27 LAB — PROTIME-INR
INR: 1.2 (ref 0.8–1.2)
Prothrombin Time: 14.7 seconds (ref 11.4–15.2)

## 2022-12-27 MED ORDER — LACTATED RINGERS IV BOLUS (SEPSIS)
500.0000 mL | Freq: Once | INTRAVENOUS | Status: AC
  Administered 2022-12-27: 500 mL via INTRAVENOUS

## 2022-12-27 MED ORDER — SODIUM CHLORIDE 0.9% FLUSH: 3.0000 mL | Freq: Once | INTRAVENOUS | Status: DC

## 2022-12-27 MED ORDER — LACTATED RINGERS IV BOLUS (SEPSIS)
1000.0000 mL | Freq: Once | INTRAVENOUS | Status: AC
  Administered 2022-12-27: 1000 mL via INTRAVENOUS

## 2022-12-27 MED ORDER — ACETAMINOPHEN 325 MG PO TABS
650.0000 mg | ORAL_TABLET | Freq: Once | ORAL | Status: AC
  Administered 2022-12-27: 650 mg
  Filled 2022-12-27: qty 2

## 2022-12-27 MED ORDER — ASPIRIN 81 MG PO CHEW
81.0000 mg | CHEWABLE_TABLET | Freq: Every day | ORAL | Status: DC
  Administered 2022-12-27 – 2023-01-06 (×11): 81 mg
  Filled 2022-12-27 (×11): qty 1

## 2022-12-27 MED ORDER — VANCOMYCIN HCL IN DEXTROSE 1-5 GM/200ML-% IV SOLN: 1000.0000 mg | Freq: Once | INTRAVENOUS | Status: DC

## 2022-12-27 MED ORDER — GADOBUTROL 1 MMOL/ML IV SOLN
7.8000 mL | Freq: Once | INTRAVENOUS | Status: AC | PRN
  Administered 2022-12-27: 7.8 mL via INTRAVENOUS

## 2022-12-27 MED ORDER — OSELTAMIVIR PHOSPHATE 30 MG PO CAPS
30.0000 mg | ORAL_CAPSULE | Freq: Two times a day (BID) | ORAL | Status: DC
  Filled 2022-12-27: qty 1

## 2022-12-27 MED ORDER — ENOXAPARIN SODIUM 40 MG/0.4ML IJ SOSY
40.0000 mg | PREFILLED_SYRINGE | INTRAMUSCULAR | Status: DC
  Administered 2022-12-27 – 2022-12-28 (×2): 40 mg via SUBCUTANEOUS
  Filled 2022-12-27 (×2): qty 0.4

## 2022-12-27 MED ORDER — LACTATED RINGERS IV BOLUS
1000.0000 mL | Freq: Once | INTRAVENOUS | Status: AC
  Administered 2022-12-27: 1000 mL via INTRAVENOUS

## 2022-12-27 MED ORDER — METRONIDAZOLE 500 MG/100ML IV SOLN
500.0000 mg | Freq: Once | INTRAVENOUS | Status: AC
  Administered 2022-12-27: 500 mg via INTRAVENOUS
  Filled 2022-12-27: qty 100

## 2022-12-27 MED ORDER — STROKE: EARLY STAGES OF RECOVERY BOOK
Freq: Once | Status: AC
  Administered 2022-12-28: 1
  Filled 2022-12-27: qty 1

## 2022-12-27 MED ORDER — LACTATED RINGERS IV SOLN: INTRAVENOUS | Status: AC

## 2022-12-27 MED ORDER — CLOPIDOGREL BISULFATE 300 MG PO TABS
300.0000 mg | ORAL_TABLET | Freq: Once | ORAL | Status: AC
  Administered 2022-12-27: 300 mg
  Filled 2022-12-27: qty 1

## 2022-12-27 MED ORDER — CLOPIDOGREL BISULFATE 75 MG PO TABS
75.0000 mg | ORAL_TABLET | Freq: Every day | ORAL | Status: DC
  Administered 2022-12-28 – 2022-12-31 (×4): 75 mg
  Filled 2022-12-27 (×4): qty 1

## 2022-12-27 MED ORDER — VANCOMYCIN HCL 1500 MG/300ML IV SOLN
1500.0000 mg | INTRAVENOUS | Status: DC
  Administered 2022-12-28: 1500 mg via INTRAVENOUS
  Filled 2022-12-27 (×2): qty 300

## 2022-12-27 MED ORDER — IOHEXOL 350 MG/ML SOLN
100.0000 mL | Freq: Once | INTRAVENOUS | Status: AC | PRN
  Administered 2022-12-27: 100 mL via INTRAVENOUS

## 2022-12-27 MED ORDER — OSELTAMIVIR PHOSPHATE 75 MG PO CAPS
75.0000 mg | ORAL_CAPSULE | Freq: Once | ORAL | Status: AC
  Administered 2022-12-27: 75 mg via ORAL
  Filled 2022-12-27: qty 1

## 2022-12-27 MED ORDER — HYDROCORTISONE SOD SUC (PF) 100 MG IJ SOLR
100.0000 mg | Freq: Two times a day (BID) | INTRAMUSCULAR | Status: DC
  Administered 2022-12-27 – 2022-12-28 (×3): 100 mg via INTRAVENOUS
  Filled 2022-12-27 (×3): qty 2

## 2022-12-27 MED ORDER — VANCOMYCIN HCL 1500 MG/300ML IV SOLN
1500.0000 mg | Freq: Once | INTRAVENOUS | Status: AC
  Administered 2022-12-27: 1500 mg via INTRAVENOUS
  Filled 2022-12-27: qty 300

## 2022-12-27 MED ORDER — SODIUM CHLORIDE 0.9 % IV SOLN
2.0000 g | Freq: Once | INTRAVENOUS | Status: AC
  Administered 2022-12-27: 2 g via INTRAVENOUS
  Filled 2022-12-27: qty 12.5

## 2022-12-27 MED ORDER — SODIUM CHLORIDE 0.9 % IV SOLN
2.0000 g | Freq: Three times a day (TID) | INTRAVENOUS | Status: DC
  Administered 2022-12-27 – 2022-12-31 (×12): 2 g via INTRAVENOUS
  Filled 2022-12-27 (×12): qty 12.5

## 2022-12-27 NOTE — Sepsis Progress Note (Signed)
Sepsis protocol is being followed by [facility]. 

## 2022-12-27 NOTE — Consult Note (Signed)
NAME:  [name], MRN:  [id number], DOB:  [birth year], LOS: 0 ADMISSION DATE:  12/27/2022, CONSULTATION DATE:  12/27/22 REFERRING MD:  Graciella Freer, CHIEF COMPLAINT:  weakness   History of Present Illness:  77 year old man with myriad of medical issues detailed below, recent [facility] ER trip for aspiration pneumonia presenting with altered mental status and right arm weakness.  Undergoing stroke workup but noted to be febrile and hypotensive.  Workup revealing mild lactic acidosis and influenza A +.  PCCM consult as his Bps remain borderline. Found to have evidence of a new left fronatl and parietal lobe stroke possibly atheroembolic vs embolic stroke.  In the ER he's received IV fluids which helped bring up his blood pressure.  He is conversant with me but a bit confused.  Denies pain or dyspnea.   Pertinent  Medical History  Oropharyngal cancer post PEG Esophageal dysmotility Pulm Fibrosis/ILD Recurrent aspiration Rheumatoid arthritis DM2 HTN  Significant Hospital Events: Including procedures, antibiotic start and stop dates in addition to other pertinent events   12/27/22 admit, code stroke; MRI brain new stroke left frontal and parietal, ct angiogram head wihtout large vessel occlusion, has debris/food in esophagus; CXR left lung infiltrate  Interim History / Subjective:  As above  Objective   Blood pressure (!) 94/47, pulse 86, temperature (!) 102.8 F (39.3 C), temperature source Oral, resp. rate (!) 27, weight 78.6 kg, SpO2 96 %.       No intake or output data in the 24 hours ending 12/27/22 1443 Filed Weights   12/27/22 0941  Weight: 78.6 kg    Examination: General:  chronically ill appearing resting comfortably in bed HENT: NCAT OP clear PULM: CTA B, normal effort CV: RRR, no mgr GI: BS+, soft, nontender MSK: normal bulk and tone Neuro: awake, alert, slow to speak and not oriented to place but tells me he had pneumonia   Flu A+ Cr ok CBC chronic anemia CBG ok Lactic  acid rising now 5.5 at 1450  CXR chronic mild changes CT head neg  Resolved Hospital Problem list   N/A  Assessment & Plan:  Influenza A Soft blood pressure Encephalopathy and r/o stroke Chronic dysphagia, prior head/neck cancer, esophageal dysmotility, recurrent aspiration post PEG RA, pulm fibrosis Suspect relative adrenal insufficiency  Discussion: [name] has new strokes and left lung aspiration pneumonia with retained food in his esophagus as well as flu A.   Since receiving IV fluids his blood pressure has improved, he has normal vital signs and despite some mild confusion he is currently without complaint.  I'm not sure why his most recent lactic acid is so high apart from some transient hypotension which appears to have resolved.  If the lactic acid is not improving would consider ischemic bowel work up though his exam and history don't currently support that (embolic?).   Rec: Repeat stat lactic acid now If still rising needs CT ab/pelvis to look for ischemic bowel Agree with vanc/cefepime/tamiflu Would keep NPO Likely needs barium esophogram when stable If develops recurrent hypotension or if hemodynamics worsen please call us back  At this time doesn't appear to need ICU, but we'll f/u the above with  you to ensure he is improving with his current care plan.   Best Practice (right click and "Reselect all SmartList Selections" daily)  Per primary  Labs   CBC: Recent Labs  Lab 12/27/22 0942 12/27/22 0946  WBC  --  8.2  NEUTROABS  --  6.6  HGB  11.6* 10.3*  HCT 34.0* 31.6*  MCV  --  88.8  PLT  --  741    Basic Metabolic Panel: Recent Labs  Lab 12/27/22 0942 12/27/22 0946  NA 137 135  K 4.2 4.1  CL 99 100  CO2  --  24  GLUCOSE 157* 160*  BUN 27* 26*  CREATININE 0.90 1.14  CALCIUM  --  8.7*   GFR: Estimated Creatinine Clearance: 60.3 mL/min (by C-G formula based on SCr of 1.14 mg/dL). Recent Labs  Lab 12/27/22 0946 12/27/22 1145  WBC 8.2  --    LATICACIDVEN 2.9* 2.1*    Liver Function Tests: Recent Labs  Lab 12/27/22 0946  AST 34  ALT 18  ALKPHOS 84  BILITOT 0.6  PROT 6.2*  ALBUMIN 3.2*   No results for input(s): "LIPASE", "AMYLASE" in the last 168 hours. No results for input(s): "AMMONIA" in the last 168 hours.  ABG    Component Value Date/Time   PHART 7.476 (H) 08/14/2012 2141   PCO2ART 25.6 (L) 08/14/2012 2141   PO2ART 101.0 (H) 08/14/2012 2141   HCO3 18.7 (L) 08/14/2012 2141   TCO2 23 12/27/2022 0942   ACIDBASEDEF 4.3 (H) 08/14/2012 2141   O2SAT 97.5 08/14/2012 2141     Coagulation Profile: Recent Labs  Lab 12/27/22 0946  INR 1.2    Cardiac Enzymes: No results for input(s): "CKTOTAL", "CKMB", "CKMBINDEX", "TROPONINI" in the last 168 hours.  HbA1C: Hemoglobin A1C  Date/Time Value Ref Range Status  11/20/2014 11:40 AM 7.5 (H) 4.2 - 6.3 % Final    Comment:    The American Diabetes Association recommends that a primary goal of therapy should be <7% and that physicians should reevaluate the treatment regimen in patients with HbA1c values consistently >8%.    Hgb A1c MFr Bld  Date/Time Value Ref Range Status  08/20/2022 07:09 AM 6.8 (H) 4.8 - 5.6 % Final    Comment:    (NOTE) Pre diabetes:          5.7%-6.4%  Diabetes:              >6.4%  Glycemic control for   <7.0% adults with diabetes   01/27/2021 04:27 AM 6.7 (H) 4.8 - 5.6 % Final    Comment:    (NOTE) Pre diabetes:          5.7%-6.4%  Diabetes:              >6.4%  Glycemic control for   <7.0% adults with diabetes     CBG: Recent Labs  Lab 12/27/22 0936  GLUCAP 168*    Review of Systems:   Too confused to answer  Past Medical History:  He,  has a past medical history of Aortic aneurysm ([place]) (05/2017), Chronic back pain, Diabetes mellitus without complication ([place]), Dyslipidemia, GERD (gastroesophageal reflux disease), H/O hiatal hernia, Hypertension, Melanoma ([place]), PONV (postoperative nausea and vomiting), Rheumatoid  arthritis ([place]), and Spasm of esophagus.   Surgical History:   Past Surgical History:  Procedure Laterality Date   aneursym repair     aneursym repair and stent placement  05/2017   BIOPSY  08/19/2022   Procedure: BIOPSY;  Surgeon: Ladene Artist, MD;  [facility] END[facility]COPY;  Service: Gastroenterology;;   CARPAL TUNNEL RELEASE Bilateral    COLON SURGERY  2007   removed 18" of colon   ERCP W/ METAL STENT PLACEMENT  11/2005   [facility] 12/14/2005   ESOPHAGOGASTRODUODENOSCOPY (EGD) WITH ESOPHAGEAL DILATION     "several times'  ESOPHAGOGASTRODUODENOSCOPY (EGD) WITH PROPOFOL N/A 08/19/2022   Procedure: ESOPHAGOGASTRODUODENOSCOPY (EGD) WITH PROPOFOL;  Surgeon: Ladene Artist, MD;  Location: [facility];  Service: Gastroenterology;  Laterality: N/A;   HAND SURGERY Bilateral    "plastic knuckles"   LAPAROSCOPIC CHOLECYSTECTOMY  01/2006   MELANOMA EXCISION     "back; left arm"   NASAL SEPTUM SURGERY     PELVIC FRACTURE SURGERY  1964   "busted in 2 places"   RESECTION DISTAL CLAVICAL Right 05/13/2016   Procedure: DISTAL CLAVICLE EXCISION;  Surgeon: Ninetta Lights, MD;  Location: [facility];  Service: Orthopedics;  Laterality: Right;   SHOULDER ARTHROSCOPY WITH ROTATOR CUFF REPAIR AND SUBACROMIAL DECOMPRESSION Right 05/13/2016   Procedure: RIGHT SHOULDER SCOPE DEBRIDEMENT, ACROMIOPLASTY, ROTATOR CUFF REPAIR; RELEASE BICEPS TENDON AND DEBRIDEMENT LABRUM;  Surgeon: Ninetta Lights, MD;  Location: [facility];  Service: Orthopedics;  Laterality: Right;   TIBIA FRACTURE SURGERY Left 2006   "put a steel rod in it"     Social History:   reports that he quit smoking about 17 years ago. His smoking use included cigarettes. He has a 46.00 pack-year smoking history. He has never used smokeless tobacco. He reports current alcohol use of about 9.0 standard drinks of alcohol per week. He reports that he does not use drugs.   Family History:  His family history includes  Cancer in his maternal grandmother; Heart disease in his father and mother.   Allergies No Known Allergies   Home Medications  Prior to Admission medications   Medication Sig Start Date End Date Taking? Authorizing Provider  abatacept (ORENCIA) 250 MG injection Inject 250 mg into the vein every 30 (thirty) days. Patient taking differently: Inject 750 mg into the vein every 30 (thirty) days. 02/12/21   Sanjuan Dame, MD  alum & mag hydroxide-simeth (MAALOX/MYLANTA) 200-200-20 MG/5ML suspension Take 30 mLs by mouth 3 (three) times daily as needed for indigestion or heartburn. 08/27/22   Rick Duff, MD  atorvastatin (LIPITOR) 40 MG tablet Take 1 tablet (40 mg total) by mouth daily. 08/27/22   Leigh Aurora, DO  diclofenac Sodium (VOLTAREN) 1 % GEL Apply 2 g topically 4 (four) times daily. 08/27/22   Rick Duff, MD  enoxaparin (LOVENOX) 40 MG/0.4ML injection Inject 40 mg into the skin daily. 08/06/22   [provider]  famotidine (PEPCID) 20 MG tablet Take 1 tablet (20 mg total) by mouth daily. 08/27/22 08/27/23  Leigh Aurora, DO  ferrous sulfate 300 (60 Fe) MG/5ML syrup Take 5 mLs (300 mg total) by mouth daily. 08/27/22   Leigh Aurora, DO  folic acid (FOLVITE) 1 MG tablet Take 1 tablet (1 mg total) by mouth daily. 08/27/22   Leigh Aurora, DO  levothyroxine (SYNTHROID) 50 MCG tablet Take 1 tablet (50 mcg total) by mouth daily. 08/27/22   Leigh Aurora, DO  lidocaine (XYLOCAINE) 2 % solution Use as directed 15 mLs in the mouth or throat 3 (three) times daily as needed for mouth pain (Indigestion). 08/27/22   Rick Duff, MD  magnesium oxide (MAG-OX) 400 (240 Mg) MG tablet Take 1 tablet (400 mg total) by mouth daily. 08/27/22   Leigh Aurora, DO  metFORMIN (GLUCOPHAGE) 500 MG tablet Take 1 tablet (500 mg total) by mouth daily. 08/27/22   Leigh Aurora, DO  Methotrexate 25 MG/ML SOSY Inject 15 mg into the skin once a week.    [provider]  ondansetron (ZOFRAN) 4 MG tablet Take 1 tablet (4  mg total) by mouth  every 12 (twelve) hours as needed for nausea or vomiting. 08/27/22   Leigh Aurora, DO  oxyCODONE (OXY IR/ROXICODONE) 5 MG immediate release tablet Take 1 tablet (5 mg total) by mouth every 4 (four) hours as needed for moderate pain. 08/27/22   Rick Duff, MD  pantoprazole (PROTONIX) 40 MG tablet Take 1 tablet (40 mg total) by mouth 2 (two) times daily. Via tube 08/27/22   Rick Duff, MD  predniSONE (DELTASONE) 1 MG tablet Take 1-5 tablets (1-5 mg total) by mouth See admin instructions. 5 mg daily for 1 month  4 mg daily for 1 month, 3 mg daily for 1 month, 2 mg daily for 1 month, 1 mg daily for 1 month. 08/27/22   Leigh Aurora, DO  sucralfate (CARAFATE) 1 GM/10ML suspension Take 1 g by mouth 4 (four) times daily -  before meals and at bedtime.    [provider]  tadalafil (CIALIS) 5 MG tablet Take 2 tablets (10 mg total) by mouth daily. 08/27/22   Leigh Aurora, DO     Critical care time:  n/a     Roselie Awkward, MD [facility] Pager: [phone] Cell: [phone] After 7:00 pm call Elink  [phone]

## 2022-12-27 NOTE — Consult Note (Signed)
Neurology Consultation  Reason for Consult: Code Stroke  Referring Physician: Maylon Peppers  CC: Right hand weakness, confusion  History is obtained from: EMS, chart review, patient  HPI: [name] is a 77 y.o. 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  He was last seen normal going to bed around 8:30 PM last night.  On arrival he is confused with a slight right arm weakness and a fever of 101.70F.  He was also recently seen at urgent care for a cough.  Glucose on arrival was 219, blood pressure is 143/68.  LKW: 2030 tpa given?: no, outside of window Thrombectomy performed?:  No, no LVO Premorbid modified Rankin scale (mRS): 1  ROS: Unable to obtain due to altered mental status.   Past Medical History:  Diagnosis Date   Aortic aneurysm ([place]) 05/2017   Chronic back pain    Diabetes mellitus without complication (HCC)    Dyslipidemia    GERD (gastroesophageal reflux disease)    H/O hiatal hernia    Hypertension    Melanoma ([place])    "back; left arm"   PONV (postoperative nausea and vomiting)    Rheumatoid arthritis ([place])    Spasm of esophagus    Past Surgical History:  Procedure Laterality Date   aneursym repair     aneursym repair and stent placement  05/2017   BIOPSY  08/19/2022   Procedure: BIOPSY;  Surgeon: Ladene Artist, MD;  Location: [facility] ENDOSCOPY;  Service: Gastroenterology;;   CARPAL TUNNEL RELEASE Bilateral    COLON SURGERY  2007   removed 18" of colon   ERCP W/ METAL STENT PLACEMENT  11/2005   Archie Endo 12/14/2005   ESOPHAGOGASTRODUODENOSCOPY (EGD) WITH ESOPHAGEAL DILATION     "several times'    ESOPHAGOGASTRODUODENOSCOPY (EGD) WITH PROPOFOL N/A 08/19/2022   Procedure: ESOPHAGOGASTRODUODENOSCOPY (EGD) WITH PROPOFOL;  Surgeon: Ladene Artist, MD;  Location: [place];  Service: Gastroenterology;  Laterality: N/A;   HAND SURGERY Bilateral    "plastic knuckles"   LAPAROSCOPIC CHOLECYSTECTOMY  01/2006   MELANOMA EXCISION     "back; left arm"   NASAL SEPTUM SURGERY     PELVIC FRACTURE SURGERY  1964   "busted in 2 places"   RESECTION DISTAL CLAVICAL Right 05/13/2016   Procedure: DISTAL CLAVICLE EXCISION;  Surgeon: Ninetta Lights, MD;  Location: [place];  Service: Orthopedics;  Laterality: Right;   SHOULDER ARTHROSCOPY WITH ROTATOR CUFF REPAIR AND SUBACROMIAL DECOMPRESSION Right 05/13/2016   Procedure: RIGHT SHOULDER SCOPE DEBRIDEMENT, ACROMIOPLASTY, ROTATOR CUFF REPAIR; RELEASE BICEPS TENDON AND DEBRIDEMENT LABRUM;  Surgeon: Ninetta Lights, MD;  Location: [place];  Service: Orthopedics;  Laterality: Right;   TIBIA FRACTURE SURGERY Left 2006   "put a steel rod in it"    Current Outpatient Medications  Medication Instructions   abatacept (ORENCIA) 250 mg, Intravenous, Every 30 days   alum & mag hydroxide-simeth (MAALOX/MYLANTA) 200-200-20 MG/5ML suspension 30 mLs, Oral, 3 times daily PRN   atorvastatin (LIPITOR) 40 mg, Oral, Daily   diclofenac Sodium (VOLTAREN) 2 g, Topical, 4 times daily   enoxaparin (LOVENOX) 40 mg, Subcutaneous, Every 24 hours   famotidine (PEPCID) 20 mg, Oral, Daily   ferrous sulfate  300 mg, Oral, Daily   folic acid (FOLVITE) 1 mg, Oral, Daily   levothyroxine (SYNTHROID) 50 mcg, Oral, Daily   lidocaine (XYLOCAINE) 2 % solution 15 mLs, Mouth/Throat, 3 times daily PRN   magnesium oxide (MAG-OX) 400 mg, Oral, Daily   metFORMIN (GLUCOPHAGE) 500 mg, Oral, Daily   Methotrexate 15 mg, Subcutaneous, Weekly   ondansetron (ZOFRAN) 4 mg, Oral, Every 12 hours PRN   oxyCODONE (OXY IR/ROXICODONE) 5 mg, Oral, Every 4 hours PRN    pantoprazole (PROTONIX) 40 mg, Oral, 2 times daily, Via tube   predniSONE (DELTASONE) 1-5 mg, Oral, See admin instructions, 5 mg daily for 1 month <BR>4 mg daily for 1 month,<BR>3 mg daily for 1 month,<BR>2 mg daily for 1 month,<BR>1 mg daily for 1 month.   sucralfate (CARAFATE) 1 g, Oral, 3 times daily before meals & bedtime   tadalafil (CIALIS) 10 mg, Oral, Daily      Family History  Problem Relation Age of Onset   Heart disease Mother    Heart disease Father    Cancer Maternal Grandmother      Social History:   reports that he quit smoking about 17 years ago. His smoking use included cigarettes. He has a 46.00 pack-year smoking history. He has never used smokeless tobacco. He reports current alcohol use of about 9.0 standard drinks of alcohol per week. He reports that he does not use drugs.  Medications  Current Facility-Administered Medications:    sodium chloride flush (NS) 0.9 % injection 3 mL, 3 mL, Intravenous, Once, Kingsley, Victoria K, DO  Current Outpatient Medications:    abatacept (ORENCIA) 250 MG injection, Inject 250 mg into the vein every 30 (thirty) days. (Patient taking differently: Inject 750 mg into the vein every 30 (thirty) days.), Disp: 1 each, Rfl: 12   alum & mag hydroxide-simeth (MAALOX/MYLANTA) 200-200-20 MG/5ML suspension, Take 30 mLs by mouth 3 (three) times daily as needed for indigestion or heartburn., Disp: 355 mL, Rfl: 0   atorvastatin (LIPITOR) 40 MG tablet, Take 1 tablet (40 mg total) by mouth daily., Disp: 30 tablet, Rfl: 11   diclofenac Sodium (VOLTAREN) 1 % GEL, Apply 2 g topically 4 (four) times daily., Disp: 10 g, Rfl: 0   enoxaparin (LOVENOX) 40 MG/0.4ML injection, Inject 40 mg into the skin daily., Disp: , Rfl:    famotidine (PEPCID) 20 MG tablet, Take 1 tablet (20 mg total) by mouth daily., Disp: 30 tablet, Rfl: 11   ferrous sulfate 300 (60 Fe) MG/5ML syrup, Take 5 mLs (300 mg total) by mouth daily., Disp: 416 mL, Rfl: 3   folic acid  (FOLVITE) 1 MG tablet, Take 1 tablet (1 mg total) by mouth daily., Disp: 30 tablet, Rfl: 11   levothyroxine (SYNTHROID) 50 MCG tablet, Take 1 tablet (50 mcg total) by mouth daily., Disp: 30 tablet, Rfl: 11   lidocaine (XYLOCAINE) 2 % solution, Use as directed 15 mLs in the mouth or throat 3 (three) times daily as needed for mouth pain (Indigestion)., Disp: 100 mL, Rfl: 3   magnesium oxide (MAG-OX) 400 (240 Mg) MG tablet, Take 1 tablet (400 mg total) by mouth daily., Disp: 30 tablet, Rfl: 11   metFORMIN (GLUCOPHAGE) 500 MG tablet, Take 1 tablet (500 mg total) by mouth daily., Disp: 60 tablet, Rfl: 11   Methotrexate 25 MG/ML SOSY, Inject 15 mg into the skin once a week., Disp: , Rfl:    ondansetron (ZOFRAN) 4 MG tablet, Take 1 tablet (4 mg total) by mouth every 12 (twelve)  hours as needed for nausea or vomiting., Disp: 20 tablet, Rfl: 0   oxyCODONE (OXY IR/ROXICODONE) 5 MG immediate release tablet, Take 1 tablet (5 mg total) by mouth every 4 (four) hours as needed for moderate pain., Disp: 30 tablet, Rfl: 0   pantoprazole (PROTONIX) 40 MG tablet, Take 1 tablet (40 mg total) by mouth 2 (two) times daily. Via tube, Disp: , Rfl:    predniSONE (DELTASONE) 1 MG tablet, Take 1-5 tablets (1-5 mg total) by mouth See admin instructions. 5 mg daily for 1 month  4 mg daily for 1 month, 3 mg daily for 1 month, 2 mg daily for 1 month, 1 mg daily for 1 month., Disp: , Rfl:    sucralfate (CARAFATE) 1 GM/10ML suspension, Take 1 g by mouth 4 (four) times daily -  before meals and at bedtime., Disp: , Rfl:    tadalafil (CIALIS) 5 MG tablet, Take 2 tablets (10 mg total) by mouth daily., Disp: 10 tablet, Rfl: 0   Exam: Current vital signs: Temp (!) 101.4 F (38.6 C) (Axillary)   Wt 78.6 kg   BMI 22.86 kg/m  Vital signs in last 24 hours: Temp:  [101.4 F (38.6 C)] 101.4 F (38.6 C) (01/01 1856) Weight:  [78.6 kg] 78.6 kg (01/01 0941)  GENERAL: Awake, alert in NAD HEENT: - Normocephalic and atraumatic, dry mm,  no LN++, no Thyromegally LUNGS - Clear to auscultation bilaterally with no wheezes CV - S1S2 RRR, no m/r/g, equal pulses bilaterally. ABDOMEN - Soft, nontender, nondistended with normoactive BS Ext: warm, well perfused, intact peripheral pulses, no edema  NEURO:  Mental Status: Awake, slow to respond to questions and does not follow commands consistently Language: speech is sparse and dysarthric.  States his name, but does not answer other questions and does not identify objects when asked Cranial Nerves: PERRL 39m/brisk. EOMI, visual fields full, no facial asymmetry, facial sensation intact, hearing intact Motor: Right arm drift, unable to hold bilateral lower extremities off the bed, but does move them at least 2/5 spontaneously Psychomotor slowing Tone: is normal and bulk is normal Sensation- Intact to light noxious stimulation in all 4 extremities Coordination: Unable to test due to mental status  Gait- deferred  NIHSS components Score: Comment  1a Level of Conscious 0'[]'$  1'[x]'$  2'[]'$  3'[]'$         1b LOC Questions 0'[]'$  1'[]'$  2'[x]'$           1c LOC Commands 0'[]'$  1'[]'$  2'[x]'$           2 Best Gaze 0'[x]'$  1'[]'$  2'[]'$           3 Visual 0'[x]'$  1'[]'$  2'[]'$  3'[]'$         4 Facial Palsy 0'[x]'$  1'[]'$  2'[]'$  3'[]'$         5a Motor Arm - left 0'[x]'$  1'[]'$  2'[]'$  3'[]'$  4'[]'$  UN'[]'$     5b Motor Arm - Right 0'[]'$  1'[x]'$  2'[]'$  3'[]'$  4'[]'$  UN'[]'$     6a Motor Leg - Left 0'[]'$  1'[]'$  2'[x]'$  3'[]'$  4'[]'$  UN'[]'$     6b Motor Leg - Right 0'[]'$  1'[]'$  2'[x]'$  3'[]'$  4'[]'$  UN'[]'$     7 Limb Ataxia 0'[x]'$  1'[]'$  2'[]'$  3'[]'$  UN'[]'$       8 Sensory 0'[x]'$  1'[]'$  2'[]'$  UN'[]'$         9 Best Language 0'[]'$  1'[]'$  2'[x]'$  3'[]'$         10 Dysarthria 0'[]'$  1'[x]'$  2'[]'$  UN'[]'$         11 Extinct. and Inattention 0'[x]'$  1'[]'$  2'[]'$           TOTAL: 13  Labs I have reviewed labs in epic and the results pertinent to this consultation are:   Basic Metabolic Panel: Recent Labs  Lab 12/27/22 0942 12/27/22 0946  NA 137 135  K 4.2 4.1  CL 99 100  CO2  --  24  GLUCOSE 157* 160*  BUN 27* 26*  CREATININE 0.90 1.14  CALCIUM  --  8.7*     CBC: Recent Labs  Lab 12/27/22 0942 12/27/22 0946  WBC  --  8.2  NEUTROABS  --  6.6  HGB 11.6* 10.3*  HCT 34.0* 31.6*  MCV  --  88.8  PLT  --  187    Coagulation Studies: Recent Labs    12/27/22 0946  LABPROT 14.7  INR 1.2    Respiratory viral panel positive for influenza A  UA positive for hemoglobin in the urine with no RBCs    Imaging I have reviewed the images obtained:  CT-head-stable and normal non contrast CT, aspects 10  CT Angio 1. Negative for large vessel occlusion, and negative CT Perfusion.   2. Positive for advanced atherosclerosis in the head and neck:  - complex plaque at the left ICA origin and bulb with estimated 60-65% stenosis. - up to Severe Left ICA cavernous segment stenosis due to bulky calcified plaque. - up to Moderate right ICA siphon stenosis. - Severe Right Vertebral Artery origin stenosis. - up to Moderate Left Vertebral Artery stenosis.   3. Partially visible retained food or debris in the thoracic esophagus, but only mild esophageal dilatation favoring esophageal dysmotility rather than achalasia. Follow-up esophagram would best evaluate further.   4.  Aortic Atherosclerosis (ICD10-I70.0).  MRI brain 1. Scattered foci of restricted diffusion involving the high left frontal lobe and high left parietal lobe compatible with acute/subacute nonhemorrhagic infarcts. 2. 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 3. Remote lacunar infarcts of the thalami bilaterally. 4. Confluent periventricular T2 hyperintensities bilaterally likely reflect the sequela of chronic microvascular ischemia. 5. Mild sinus disease.   Assessment:  77 y.o. male with a past medical history of aortic aneurysm, diabetes, dyslipidemia, GERD, hiatal hernia, hypertension, rheumatoid arthritis, PEG tube placement, oropharyngeal cancer, PAD, ILD, pulmonary fibrosis who presents after being found on the floor of his bedroom.  He was  last seen normal going to bed around 8:30 PM last night.  On arrival he is confused with a slight right arm weakness and a fever of 101.66F.  He was also recently seen at urgent care for a cough.  He was not started on any antibiotics at that time, chest x-ray was negative for an acute abnormality.  Glucose on arrival was 219, blood pressure is 143/68.    His altered mental status has rapidly cleared and I think reflects a combination of acute left MCA territory stroke as well as for sepsis secondary to influenza A.  However if he is not rapidly improving with supportive care, low threshold to pursue lumbar puncture  Etiology of his stroke is favored to be atheroembolic in the setting of his left carotid ulcerated plaque as well as severe left cavernous segment stenosis, but cannot rule out embolic etiology at this time  Impression: Metabolic encephalopathy in the setting URI and fever  Recommendations: - MRI Brain with and without contrast given fever and altered mental status, which revealed punctate acute strokes in the left MCA territory - Stroke workup indicated if MRI is positive (echocardiogram, A1c, lipid panel, telemetry, 30-day event monitor on discharge if no atrial fibrillation  captured here but only if he would be an anticoagulation candidate given his history of GI bleed) - ED plans to admit to hospitalist service for sepsis workup - Stroke team to follow along  Patient seen and examined by NP/APP with MD. MD to update note as needed.   Janine Ores, DNP, FNP-BC [facility] Pager: [phone]  Attending Neurologist's note:  I personally saw this patient, gathering history, performing a full neurologic examination, reviewing relevant labs, personally reviewing relevant imaging including head CT, CTA, CTP, and formulated the assessment and plan, adding the note above for completeness and clarity to accurately reflect my thoughts   CRITICAL CARE Performed by:  Lorenza Chick   Total critical care time: 35 minutes  Critical care time was exclusive of separately billable procedures and treating other patients.  Critical care was necessary to treat or prevent imminent or life-threatening deterioration, emergent evaluation for potential intervention with thrombectomy or thrombolytic.  Critical care was time spent personally by me on the following activities: development of treatment plan with patient and/or surrogate as well as nursing, discussions with consultants, evaluation of patient's response to treatment, examination of patient, obtaining history from patient or surrogate, ordering and performing treatments and interventions, ordering and review of laboratory studies, ordering and review of radiographic studies, pulse oximetry and re-evaluation of patient's condition.

## 2022-12-27 NOTE — ED Provider Notes (Signed)
77 year old male presents to ED as code stroke He has been at the bridge by neurology Patient is awake and alert and breathing without difficulty. Airway is intact and he is cleared to go to CT scan   Pattricia Boss, MD 12/27/22 [phone]

## 2022-12-27 NOTE — ED Notes (Addendum)
Pt transported to MRI. Pt more alert, answering questions. Thought he was a [place], which is not far off since he was just hospitalized this past week there. Pt able to raise arms and legs.

## 2022-12-27 NOTE — ED Triage Notes (Signed)
Pt LSN 8:30 pm yesterday evening. Pt from home with family. Pt put himself to bed. Normally alert, ambulatory, able to have conversations. Pt found this morning approx 0830 on floor by family. Pt altered, speech normal, right arm weakness. Code stroke called. Pt d/ced from [place] on Sat with pna. Currently on antibiotics. Pt has a feeding tube in place. BP 140/70, HR 110. Not on blood thinners

## 2022-12-27 NOTE — Hospital Course (Addendum)
[name] is a 77 y.o. 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  Sepsis with Acute Encephalopathy Recent hx of pneumonia Influenza A Patient presented to the ED with altered mental status after being found on the floor at home by his ex-wife, patient was treated for pneumonia a few days prior. In the ED patient was febrile to 102.8, tachycardic and tachypneic. Lactic acid 2.9. Normal WBC. Patient started on broad spectrum antibiotics. On admission he became hypotensive with MAPs 50-60 which improved with fluid resuscitation. Respiratory panel positive for Flu A. Started on Tamiflu x5 days, vancomycin and cefepime. MRSA nasal swab, urine culture, and blood cultures were negative. Patient had one episode of fever on hospital day 4, likely in setting of influenza/pneumonia and obtained blood cultures to r/o bacteremia.   Left MCA CVA  Patient presented with RUE weakness and dysarthria. MRI brain showed punctate acute strokes of left frontal and parietal along with remote lacunar infarcts of thalami bilaterally. Patient started on Plavix and ASA. Neuro and IR was planning for diagnostic angiogram but cancelled for new fever, they recommended getting procedure as outpatient. ***   Malnutrition    T2DM  Patient has history of T2DM on metformin, last A1c 6.4% from 8/23.   Chronic Steroid Use Patient has been on chronic prednisone for RA on taper prior to admission. Held prednisone during admission, patient tolerated well.***  Esophageal outlet obstruction with PEG tube    ------------------- 1/2: Patient reports that he is not feeling well today. He feels disoriented and reports a generalized headache. Patient feels as  though he is breathing well. He is only oriented to person and some of situation. Discussed that the patients RUE weakness is secondary to a stroke. Patient reports that he does not drink or eat food orally and only uses his GJ tube. Discussed plan for neurology to see him today. Discussed plan to work with PT/OT.   Physical exam: GJ tube in place, clean dry intact, no abdominal pain, normal sensation, no facial droop, 0/5 strength of the right hand, 5/5 strength elsewhere  1/3: Patient reports that he is feeling "so so" today. Denies any new pain at this time. He still feels a bit disoriented. He reports no improvement in the movement of his right hand. Discussed plan to continue working with PT/OT/SLP. Discussed plan for RD consult today. Discussed plan for neurology to see him today to discuss possibility of elective stenting.   Physical exam: AxO x4,   1/4: Patient is not feeling well this morning. He states that he feels warm. Discussed continuing antibiotics for today given his fever. Discussed plan for diagnostic angio with neuro IR today. Patient reports that he receives continuous feeds at home per his PEG tube. Discussed plan to speak with his ex-wife today regarding the amount of his feeds he was getting at home. Patient is unsure of where he will go following discharge. He would like [place] to speak with his son regarding his location following discharge.   Exam: mobility of right hand unchanged,   1/5: Patient is feeling "so so" today. Denies any new complaints. Discussed plan for likely discharge following results of repeat blood cultures. Discussed plan for outpatient angiogram.   Exam: 1/5 strength of  right hand grip (muscle twitch)  1/7: Feeling grateful he is alive and doing a bit better today. He remembers being confused and unable to have conversations. Otherwise, he would like to shave today. Patient seem more interactive today.  Patient was doing self-directed hand PT  exercises.  Discussed the plan for stent placement tomorrow Monday to open up his arteries. Also discussed the need to keep his blood pressure elevated to prevent hypoperfusion.   ***TSH today 4.774 --> send for total T3 and T4?   1/8: Feeling better today.  1/09: Pt reports some aches and pains. He has bene trying to use the incentive spirometer. He notes no changes to his extremities. Right hand has decreased grip strength.  Discussed plan with patient and patient is understandable about the plan.   1/10: Patient reports SOB that is unchanged from yesterday. He denies chest pain at this time. He has no new complaints at this time. Discussed plan to reach out to acute inpatient rehab today.   1/11 Feeling great overall. He is ready to move on to CIR. Discussed that family is aware of transition

## 2022-12-27 NOTE — Progress Notes (Signed)
Pharmacy Antibiotic Note  [name] is a 77 y.o. male for which pharmacy has been consulted for vancomycin and cefepime dosing for sepsis.  Patient with a history of RA (Abatacept, MTX and Prednisone), ILD, DVT/PE (no AC 2/2 GIB), T2DM, HTN, HLD, s/p Multiple Dilatations and GJ Tube Placement for FTT, subclinical Hypothyroidism, AAA L Illiac Artery Aneurysm. Patient presenting with RUE weakness.  SCr 1.14 - near baseline WBC 8.2; LA 2.1; T 102.8; HR 86; RR 27 Flu + COVID neg  Plan: Cefepime 2g q8hr Vancomycin 1500 mg q24hr (eAUC 486.6) unless change in renal function Trend WBC, Fever, Renal function F/u cultures, clinical course, WBC, fever De-escalate when able Levels at steady state  Weight: 78.6 kg (173 lb 4.5 oz)  Temp (24hrs), Avg:102.1 F (38.9 C), Min:101.4 F (38.6 C), Max:102.8 F (39.3 C)  Recent Labs  Lab 12/27/22 0942 12/27/22 0946  WBC  --  8.2  CREATININE 0.90 1.14  LATICACIDVEN  --  2.9*    Estimated Creatinine Clearance: 60.3 mL/min (by C-G formula based on SCr of 1.14 mg/dL).    No Known Allergies  Antimicrobials this admission: cefepime 1/1 >>  vancomycin 1/1 >>  Microbiology results: Pending  Thank you for allowing pharmacy to be a part of this patient's care.  Lorelei Pont, PharmD, BCPS 12/27/2022 11:06 AM ED Clinical Pharmacist -  [phone]

## 2022-12-27 NOTE — ED Provider Notes (Signed)
[facility] Provider Note   CSN: [id number] Arrival date & time: 12/27/22  0930     History  Chief Complaint  Patient presents with   Code Stroke    [name] is a 77 y.o. male.  77 y.o. 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 Per patients ex-wife, the patient was seen over the weekend at the [facility] ED on Saturday and treated for pneumonia. She states he was initially doing well, talking to a friend on the phone over the weekend clearly. She states that last night he was feeling a little weak but was still able to walk himself into the bedroom and put himself to bed. She states she went to bed around 8:30 PM last night and when she woke up this morning she found him on the ground not talking and unable to get himself up. EMS was called and they noted RUE weakness and stroke alert was called.  The history is provided by the spouse. The history is limited by the condition of the patient.       Home Medications Prior to Admission medications   Medication Sig Start Date End Date Taking? Authorizing Provider  abatacept (ORENCIA) 250 MG injection Inject 250 mg into the vein every 30 (thirty) days. Patient taking differently: Inject 750 mg into the vein every 30 (thirty) days. 02/12/21  Yes Sanjuan Dame, MD  atorvastatin (LIPITOR) 40 MG tablet Take 1 tablet (40 mg total) by mouth daily. 08/27/22  Yes Patel, Amar, DO  enoxaparin (LOVENOX) 40 MG/0.4ML injection Inject 40 mg into the skin daily. 08/06/22  Yes [provider]  ferrous sulfate 300 (60 Fe) MG/5ML syrup Take 5 mLs (300 mg total) by mouth daily. 08/27/22  Yes Leigh Aurora, DO  metFORMIN (GLUCOPHAGE) 500 MG tablet Take 1 tablet (500 mg total) by mouth daily. 08/27/22  Yes Leigh Aurora, DO  Methotrexate 25 MG/ML SOSY Inject 15 mg into the skin once a week.   Yes [provider]  oxyCODONE (OXY IR/ROXICODONE) 5 MG immediate release tablet Take 1 tablet (5 mg total) by mouth every 4 (four) hours as needed for moderate pain. 08/27/22  Yes Rick Duff, MD  predniSONE (DELTASONE) 1 MG tablet Take 1-5 tablets (1-5 mg total) by mouth See admin instructions. 5 mg daily for 1 month  4 mg daily for 1 month, 3 mg daily for 1 month, 2 mg daily for 1 month, 1 mg daily for 1 month. 08/27/22  Yes Leigh Aurora, DO  tadalafil (CIALIS) 5 MG tablet Take 2 tablets (10 mg total) by mouth daily. 08/27/22  Yes Leigh Aurora, DO  alum & mag hydroxide-simeth (MAALOX/MYLANTA) 200-200-20 MG/5ML suspension Take 30 mLs by mouth 3 (three) times daily as needed for indigestion or heartburn. Patient not taking: Reported on 12/27/2022 08/27/22   Rick Duff, MD  diclofenac Sodium (VOLTAREN) 1 % GEL Apply 2 g topically 4 (four) times daily. 08/27/22   Rick Duff, MD  famotidine (PEPCID) 20 MG tablet Take 1 tablet (20 mg total) by mouth daily. 08/27/22 08/27/23  Leigh Aurora, DO  folic  acid (FOLVITE) 1 MG tablet Take 1 tablet (1 mg total) by mouth daily. 08/27/22   Leigh Aurora, DO  levothyroxine (SYNTHROID) 50 MCG tablet Take 1 tablet (50 mcg total) by mouth daily. 08/27/22   Leigh Aurora, DO  lidocaine (XYLOCAINE) 2 % solution Use as directed 15 mLs in the mouth or throat 3 (three) times daily as needed for mouth pain (Indigestion). 08/27/22   Rick Duff, MD  magnesium oxide (MAG-OX) 400 (240 Mg) MG tablet Take 1 tablet (400 mg total) by mouth daily. 08/27/22   Leigh Aurora, DO  ondansetron (ZOFRAN) 4 MG tablet Take 1 tablet (4 mg total) by mouth every 12 (twelve) hours as needed for  nausea or vomiting. 08/27/22   Leigh Aurora, DO  pantoprazole (PROTONIX) 40 MG tablet Take 1 tablet (40 mg total) by mouth 2 (two) times daily. Via tube 08/27/22   Rick Duff, MD  sucralfate (CARAFATE) 1 GM/10ML suspension Take 1 g by mouth 4 (four) times daily -  before meals and at bedtime.    [provider]      Allergies    Patient has no known allergies.    Review of Systems   Review of Systems  Physical Exam Updated Vital Signs BP (!) 107/47   Pulse 87   Temp 98.9 F (37.2 C) (Oral)   Resp (!) 21   Wt 78.6 kg   SpO2 100%   BMI 22.86 kg/m  Physical Exam Vitals and nursing note reviewed.  Constitutional:      General: He is not in acute distress.    Appearance: He is ill-appearing.  HENT:     Head: Normocephalic and atraumatic.     Nose: Nose normal.     Mouth/Throat:     Mouth: Mucous membranes are moist.     Pharynx: Oropharynx is clear.  Eyes:     Extraocular Movements: Extraocular movements intact.     Conjunctiva/sclera: Conjunctivae normal.  Cardiovascular:     Rate and Rhythm: Regular rhythm. Tachycardia present.     Pulses: Normal pulses.     Heart sounds: Normal heart sounds.  Pulmonary:     Effort: Pulmonary effort is normal.     Breath sounds: Normal breath sounds.  Abdominal:     General: Abdomen is flat.     Palpations: Abdomen is soft.     Tenderness: There is no abdominal tenderness.     Comments: G tube in place  Musculoskeletal:        General: No swelling or deformity. Normal range of motion.     Cervical back: Normal range of motion and neck supple.  Skin:    General: Skin is warm and dry.     Comments: No sacral wounds seen  Neurological:     Mental Status: He is alert.     Comments: Awake and alert, answering "yes" to every question  Moving LUE and RLE to command, not following commands in RUE or LLE No obvious facial droop     ED Results / Procedures / Treatments   Labs (all labs ordered are listed, but only  abnormal results are displayed) Labs Reviewed  RESP PANEL BY RT-PCR (RSV, FLU A&B, COVID)  RVPGX2 - Abnormal; Notable for the following components:      Result Value   Influenza A by PCR POSITIVE (*)    All other components within normal limits  CBC - Abnormal; Notable for the following components:   RBC 3.56 (*)    Hemoglobin 10.3 (*)  HCT 31.6 (*)    RDW 17.9 (*)    All other components within normal limits  DIFFERENTIAL - Abnormal; Notable for the following components:   Lymphs Abs 0.4 (*)    Monocytes Absolute 1.2 (*)    All other components within normal limits  COMPREHENSIVE METABOLIC PANEL - Abnormal; Notable for the following components:   Glucose, Bld 160 (*)    BUN 26 (*)    Calcium 8.7 (*)    Total Protein 6.2 (*)    Albumin 3.2 (*)    All other components within normal limits  LACTIC ACID, PLASMA - Abnormal; Notable for the following components:   Lactic Acid, Venous 2.9 (*)    All other components within normal limits  LACTIC ACID, PLASMA - Abnormal; Notable for the following components:   Lactic Acid, Venous 2.1 (*)    All other components within normal limits  URINALYSIS, ROUTINE W REFLEX MICROSCOPIC - Abnormal; Notable for the following components:   Specific Gravity, Urine 1.042 (*)    Hgb urine dipstick SMALL (*)    All other components within normal limits  CBG MONITORING, ED - Abnormal; Notable for the following components:   Glucose-Capillary 168 (*)    All other components within normal limits  I-STAT CHEM 8, ED - Abnormal; Notable for the following components:   BUN 27 (*)    Glucose, Bld 157 (*)    Calcium, Ion 1.12 (*)    Hemoglobin 11.6 (*)    HCT 34.0 (*)    All other components within normal limits  CULTURE, BLOOD (ROUTINE X 2)  CULTURE, BLOOD (ROUTINE X 2)  URINE CULTURE  PROTIME-INR  APTT  ETHANOL  LACTIC ACID, PLASMA  CK  HEMOGLOBIN A1C  CBG MONITORING, ED    EKG EKG Interpretation  Date/Time:  Monday December 27 2022 10:28:59  EST Ventricular Rate:  111 PR Interval:  152 QRS Duration: 82 QT Interval:  326 QTC Calculation: 443 R Axis:   84 Text Interpretation: Sinus tachycardia Left atrial enlargement Borderline right axis deviation No significant change since last tracing Confirmed by Leanord Asal ([id number]) on 12/27/2022 11:31:13 AM  Radiology MR BRAIN W WO CONTRAST  Result Date: 12/27/2022 CLINICAL DATA:  Code stroke. Patient found on floor this morning. Abnormal speech. Right arm weakness. EXAM: MRI HEAD WITHOUT AND WITH CONTRAST TECHNIQUE: Multiplanar, multiecho pulse sequences of the brain and surrounding structures were obtained without and with intravenous contrast. CONTRAST:  7.73m GADAVIST GADOBUTROL 1 MMOL/ML IV SOLN COMPARISON:  CT head and CTA head and neck 12/27/2022 FINDINGS: Brain: Diffusion-weighted images demonstrate scattered foci of restricted diffusion involving the high left frontal lobe. There is some involvement of the primary motor cortex. Scattered punctate areas are present in the high left parietal lobe as well. A single focus scratched at a single punctate focus of restricted diffusion is present in adjacent to the left lateral ventricle on image 83 of the axial diffusion sequence. More remote cortical infarcts are present in the posteromedial left parietal lobe FLAIR signal is associated with the areas of acute infarction. Confluent periventricular T2 hyperintensities are present bilaterally. The ventricles are proportionate to the degree of atrophy. Deep brain nuclei are within normal limits. Remote lacunar infarcts are present in the thalami bilaterally. The internal auditory canals are within normal limits. The brainstem and cerebellum are within normal limits. Postcontrast images demonstrate no pathologic enhancement. Vascular: Flow is present in the major intracranial arteries. Skull and upper cervical spine: The craniocervical junction is normal.  Upper cervical spine is within normal limits.  Marrow signal is unremarkable. Sinuses/Orbits: Mild mucosal thickening is present in the inferior left maxillary sinus. Anterior left ethmoid air cells are opacified. Minimal fluid is present in the sphenoid sinuses. Small mastoid effusions are present. Bilateral lens replacements are noted. Globes and orbits are otherwise unremarkable. IMPRESSION: 1. Scattered foci of restricted diffusion involving the high left frontal lobe and high left parietal lobe compatible with acute/subacute nonhemorrhagic infarcts. 2. 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 3. Remote lacunar infarcts of the thalami bilaterally. 4. Confluent periventricular T2 hyperintensities bilaterally likely reflect the sequela of chronic microvascular ischemia. 5. Mild sinus disease. These results were called by telephone at the time of interpretation on 12/27/2022 at 3:00 pm to provider [facility] , who verbally acknowledged these results. Electronically Signed   By: San Morelle M.D.   On: 12/27/2022 15:00   DG Chest Port 1 View  Result Date: 12/27/2022 CLINICAL DATA:  Sepsis EXAM: PORTABLE CHEST 1 VIEW COMPARISON:  01/26/2021 FINDINGS: Pulmonary vascular congestion noted. No focal consolidation. There may be small left-sided pleural effusion. No pneumothorax identified. Aorta is calcified. IMPRESSION: Pulmonary vascular congestion without focal consolidation. Possible small left-sided pleural effusion. Electronically Signed   By: Sammie Bench M.D.   On: 12/27/2022 11:22   CT ANGIO HEAD NECK W WO CM W PERF (CODE STROKE)  Result Date: 12/27/2022 CLINICAL DATA:  77 year old male code stroke presentation. EXAM: CT ANGIOGRAPHY HEAD AND NECK CT PERFUSION BRAIN TECHNIQUE: Multidetector CT imaging of the head and neck was performed using the standard protocol during bolus administration of intravenous contrast. Multiplanar CT image reconstructions and MIPs were obtained to evaluate the vascular  anatomy. Carotid stenosis measurements (when applicable) are obtained utilizing NASCET criteria, using the distal internal carotid diameter as the denominator. Multiphase CT imaging of the brain was performed following IV bolus contrast injection. Subsequent parametric perfusion maps were calculated using RAPID software. RADIATION DOSE REDUCTION: This exam was performed according to the departmental dose-optimization program which includes automated exposure control, adjustment of the mA and/or kV according to patient size and/or use of iterative reconstruction technique. CONTRAST:  172m OMNIPAQUE IOHEXOL 350 MG/ML SOLN COMPARISON:  Plain head CT 0943 hours today. Neck CT 01/24/2015. FINDINGS: CT Brain Perfusion Findings: ASPECTS: 10 CBF (<30%) Volume: 054m  No CBF or CBV parameter abnormality. Perfusion (Tmax>6.0s) volume: 60m23mismatch Volume: Not applicable Infarction Location:Not applicable CTA NECK Skeleton: Advanced cervical spine degeneration. No acute osseous abnormality identified. Upper chest: Retained food or debris in the thoracic esophagus which is only mildly dilated. Upper lung scarring. No superior mediastinal lymphadenopathy. Other neck: 2016 right level 2 lymphadenopathy does not persist. No neck mass identified. Aortic arch: Extensive Calcified aortic atherosclerosis. 3 vessel arch configuration. Right carotid system: Brachiocephalic artery and right CCA origin plaque without stenosis. Widespread right CCA plaque with no stenosis before the bifurcation. Bulky calcified plaque at the bifurcation and proximal right ICA but less than 50 % stenosis with respect to the distal vessel. Left carotid system: Similar widespread atherosclerosis. But more complex soft and calcified plaque at the left ICA origin and bulb resulting in bulb level stenosis estimated at 60-65 % with respect to the distal vessel. Left ICA remains patent to the skull base. Vertebral arteries: Proximal right subclavian artery plaque  without stenosis. Calcified plaque at the right vertebral artery origin with severe stenosis (series 11, image 160). But the vessel remains patent and is fairly codominant to the skull base.  Extensive proximal left subclavian artery plaque. Calcified left vertebral artery origin and V1 segment with mild-to-moderate stenosis. Additional left V1 calcified plaque with only mild stenosis. The left vertebral artery is mildly dominant and patent to the skull base. CTA HEAD Posterior circulation: Non dominant right V4 segment is patent to the vertebrobasilar junction with normal PICA origin and no significant plaque or stenosis. Left V4 is mildly dominant with calcified plaque and mild to moderate stenosis on series 8, image 80 upstream of normal left PICA origin. Patent vertebrobasilar junction and basilar artery without stenosis. SCA and PCA origins are normal. Posterior communicating arteries are diminutive or absent. Bilateral PCA branches are within normal limits. Anterior circulation: Both ICA siphons are patent. However, there is bulky calcified siphon plaque bilaterally, greater on the left with associated severe left cavernous segment stenosis (series 8, image 61 and series 12, image 116. Contralateral right siphon calcified plaque with mild to moderate cavernous segment stenosis. Patent carotid termini, MCA and ACA origins. Normal anterior communicating artery. Bilateral ACA branches are within normal limits. Left MCA M1 segment and trifurcation are patent without stenosis. Right MCA M1 segment and trifurcation are patent without stenosis. Right MCA branches are within normal limits. Left MCA branches are patent with mild irregularity. No branch occlusion is identified. Venous sinuses: Early contrast timing, grossly patent. Anatomic variants: Dominant left vertebral artery. Review of the MIP images confirms the above findings IMPRESSION: 1. Negative for large vessel occlusion, and negative CT Perfusion. 2.  Positive for advanced atherosclerosis in the head and neck: - complex plaque at the left ICA origin and bulb with estimated 60-65% stenosis. - up to Severe Left ICA cavernous segment stenosis due to bulky calcified plaque. - up to Moderate right ICA siphon stenosis. - Severe Right Vertebral Artery origin stenosis. - up to Moderate Left Vertebral Artery stenosis. 3. Partially visible retained food or debris in the thoracic esophagus, but only mild esophageal dilatation favoring esophageal dysmotility rather than achalasia. Follow-up esophagram would best evaluate further. 4.  Aortic Atherosclerosis (ICD10-I70.0). CTP and Vascular findings were communicated to Dr. Curly Shores at 10:26 am on 12/27/2022 by text page via the [facility] messaging system. Electronically Signed   By: Genevie Ann M.D.   On: 12/27/2022 10:29   CT HEAD CODE STROKE WO CONTRAST  Result Date: 12/27/2022 CLINICAL DATA:  Code stroke.  77 year old male EXAM: CT HEAD WITHOUT CONTRAST TECHNIQUE: Contiguous axial images were obtained from the base of the skull through the vertex without intravenous contrast. RADIATION DOSE REDUCTION: This exam was performed according to the departmental dose-optimization program which includes automated exposure control, adjustment of the mA and/or kV according to patient size and/or use of iterative reconstruction technique. COMPARISON:  Head CT 08/19/2022. FINDINGS: Brain: Stable cerebral volume. No midline shift, ventriculomegaly, mass effect, evidence of mass lesion, intracranial hemorrhage or evidence of cortically based acute infarction. Gray-white matter differentiation appears stable since last year and largely normal for age throughout the brain. Vascular: Calcified atherosclerosis at the skull base. No suspicious intracranial vascular hyperdensity. Skull: No acute osseous abnormality identified. Sinuses/Orbits: Chronic paranasal sinus disease and prior sinus surgery. Sinus aeration not significantly changed from August.  Tympanic cavities and mastoids remain clear. Other: No acute orbit or scalp soft tissue finding. ASPECTS [facility] Stroke Program Early CT Score) Total score (0-10 with 10 being normal): 10 IMPRESSION: 1. Stable and normal for age noncontrast CT appearance of the brain. 2. ASPECTS 10. Electronically Signed   By: Genevie Ann M.D.   On:  12/27/2022 09:50    Procedures .Critical Care  Performed by: Kemper Durie, DO Authorized by: Kemper Durie, DO   Critical care provider statement:    Critical care time (minutes):  40   Critical care was necessary to treat or prevent imminent or life-threatening deterioration of the following conditions:  CNS failure or compromise and sepsis   Critical care was time spent personally by me on the following activities:  Blood draw for specimens, development of treatment plan with patient or surrogate, discussions with consultants, discussions with primary provider, evaluation of patient's response to treatment, examination of patient, obtaining history from patient or surrogate, ordering and performing treatments and interventions, ordering and review of laboratory studies, ordering and review of radiographic studies, pulse oximetry, re-evaluation of patient's condition and review of old charts   I assumed direction of critical care for this patient from another provider in my specialty: no     Care discussed with: admitting provider       Medications Ordered in ED Medications  sodium chloride flush (NS) 0.9 % injection 3 mL (3 mLs Intravenous Not Given 12/27/22 1032)  lactated ringers infusion ( Intravenous Restarted 12/27/22 1510)  enoxaparin (LOVENOX) injection 40 mg (40 mg Subcutaneous Given 12/27/22 1456)  oseltamivir (TAMIFLU) capsule 75 mg (75 mg Oral Given 12/27/22 1456)    Followed by  oseltamivir (TAMIFLU) capsule 30 mg (has no administration in time range)  ceFEPIme (MAXIPIME) 2 g in sodium chloride 0.9 % 100 mL IVPB (has no administration in time  range)  vancomycin (VANCOREADY) IVPB 1500 mg/300 mL (has no administration in time range)   stroke: early stages of recovery book (has no administration in time range)  iohexol (OMNIPAQUE) 350 MG/ML injection 100 mL (100 mLs Intravenous Contrast Given 12/27/22 1006)  lactated ringers bolus 1,000 mL (0 mLs Intravenous Stopped 12/27/22 1240)    And  lactated ringers bolus 1,000 mL (0 mLs Intravenous Stopped 12/27/22 1247)    And  lactated ringers bolus 500 mL (0 mLs Intravenous Stopped 12/27/22 1316)  ceFEPIme (MAXIPIME) 2 g in sodium chloride 0.9 % 100 mL IVPB (0 g Intravenous Stopped 12/27/22 1240)  metroNIDAZOLE (FLAGYL) IVPB 500 mg (0 mg Intravenous Stopped 12/27/22 1300)  acetaminophen (TYLENOL) tablet 650 mg (650 mg Per Tube Given 12/27/22 1147)  vancomycin (VANCOREADY) IVPB 1500 mg/300 mL (0 mg Intravenous Stopped 12/27/22 1457)  lactated ringers bolus 1,000 mL (1,000 mLs Intravenous New Bag/Given 12/27/22 1454)  gadobutrol (GADAVIST) 1 MMOL/ML injection 7.8 mL (7.8 mLs Intravenous Contrast Given 12/27/22 1430)    ED Course/ Medical Decision Making/ A&P Clinical Course as of 12/27/22 1538  Mon Dec 27, 2022  1133 Neurology recommended MRI brain and only needs further stroke work up if MRI is positive. Patient is stable for admission to hospitalist for further management of his sepsis and AMS. [VK]  70 I spoke with Dr. Curly Shores of neurology who states that due to the patient's immunocompromise state, she is going to add on an MRI with and without contrast to evaluate for possible meningitis and recommends an LP when he returns from MRI. [VK]  1511 After reviewing MRI, Dr. Curly Shores sees evidence of stroke which is more likely to explain his symptoms and states we can hold off on LP at this time. Further stroke work up and flu management per hospitalist.  Patient's symptoms have significantly improved and he is now answering questions appropriately and following commands.  He does have some weakness in right hand  grip strength  but has no drift in all 4 extremities. [VK]    Clinical Course User Index [VK] Kemper Durie, DO                           Medical Decision Making This patient presents to the ED with chief complaint(s) of weakness, AMS with pertinent past medical history of RA c/b ILD, DM, HTN, esophageal outlet obstruction with chronic PEG tube, hypothyroidism, AAA s/p repair which further complicates the presenting complaint. The complaint involves an extensive differential diagnosis and also carries with it a high risk of complications and morbidity.    The differential diagnosis includes CVA, ICH/mass effect, sepsis, electrolyte abnormality, anemia, arrhythmia    Additional history obtained: Additional history obtained from spouse Records reviewed Care Everywhere/External Records  ED Course and Reassessment: Patient was made a pre-hospital arrival stroke alert and was met by neurology at the door.  Independent labs interpretation:  The following labs were independently interpreted: Influenza positive, lactic acidosis, otherwise within normal range  Independent visualization of imaging: - I independently visualized the following imaging with scope of interpretation limited to determining acute life threatening conditions related to emergency care: CT head, CTA, which revealed significant vascular disease but no obvious acute occlusion  Consultation: - Consulted or discussed management/test interpretation w/ external professional: Neurology, hospitalist  Consideration for admission or further workup: Patient requires admission for further management of his stroke and altered mental status in the setting of the flu Social Determinants of health: N/A    Amount and/or Complexity of Data Reviewed Labs: ordered. Radiology: ordered.  Risk OTC drugs. Prescription drug management. Decision regarding hospitalization.          Final Clinical Impression(s) / ED  Diagnoses Final diagnoses:  Sepsis without acute organ dysfunction, due to unspecified organism [facility])  Disorientation  Weakness  Influenza A    Rx / DC Orders ED Discharge Orders     None         Kemper Durie, DO 12/27/22 1538

## 2022-12-27 NOTE — H&P (Addendum)
Date: 12/27/2022               Patient Name:  [name] MRN: [id number]  DOB: Aug 06, 1946 Age / Sex: 77 y.o., male   PCP: Elaina Pattee, MD         Medical Service: Internal Medicine Teaching Service         Attending Physician: Dr. Lottie Mussel, MD      First Contact: Dr. Starlyn Skeans, MD Pager [phone]    Second Contact: Dr. Delene Ruffini, MD Pager [phone]         After Hours (After 5p/  First Contact Pager: [phone]  weekends / holidays): Second Contact Pager: [phone]   SUBJECTIVE   Chief Complaint: RUE weakness  History of Present Illness:  [name] is a 77 y.o. 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  He lives with his ex-wife who found him. She states the night prior he was feeling a little weak but able to walk himself to bed. EMS called and noted RUE weakness with concern for stroke. Endorses generalized weakness. Denies any pain.   This past weekend, he was treated for pneumonia with oral antibiotics at outside facility. Endorses productive cough with mucus.   History is limited by patient's current mental status. Most of information per chart review, ex-wife and ED provider.    ED Course: Arrived by EMS. Febrile to 102.8, tachycardic and tachypneic. Lactic acid 2.9. Normal WBC. CT head negative. Received at least 2.5 L IV fluids and broad spectrum antibiotics.    Meds:  Current Meds  Medication Sig   abatacept (ORENCIA) 250 MG injection Inject 250 mg into the vein every 30 (thirty) days. (Patient taking differently: Inject 750 mg into the vein every 30 (thirty) days.)   atorvastatin (LIPITOR) 40 MG tablet Take 1 tablet (40 mg total) by mouth daily.   enoxaparin (LOVENOX) 40 MG/0.4ML injection Inject 40 mg  into the skin daily.   ferrous sulfate 300 (60 Fe) MG/5ML syrup Take 5 mLs (300 mg total) by mouth daily.   metFORMIN (GLUCOPHAGE) 500 MG tablet Take 1 tablet (500 mg total) by mouth daily.   Methotrexate 25 MG/ML SOSY Inject 15 mg into the skin once a week.   oxyCODONE (OXY IR/ROXICODONE) 5 MG immediate release tablet Take 1 tablet (5 mg total) by mouth every 4 (four) hours as needed for moderate pain.   predniSONE (DELTASONE) 1 MG tablet Take 1-5 tablets (1-5 mg total) by mouth See admin instructions. 5 mg daily for 1 month  4 mg daily for 1 month, 3 mg daily for 1 month, 2 mg daily for 1 month, 1 mg daily for 1 month.   tadalafil (CIALIS) 5 MG tablet Take 2 tablets (10 mg total) by mouth daily.    Past Medical History Past Medical History:  Diagnosis Date   Aortic aneurysm ([place]) 05/2017   Chronic back pain    Diabetes mellitus without complication (HCC)    Dyslipidemia    GERD (gastroesophageal reflux disease)    H/O hiatal hernia    Hypertension    Melanoma ([place])    "back; left arm"   PONV (postoperative nausea and vomiting)    Rheumatoid arthritis ([place])    Spasm of esophagus  Past Surgical History Past Surgical History:  Procedure Laterality Date   aneursym repair     aneursym repair and stent placement  05/2017   BIOPSY  08/19/2022   Procedure: BIOPSY;  Surgeon: Ladene Artist, MD;  Location: [facility] ENDOSCOPY;  Service: Gastroenterology;;   CARPAL TUNNEL RELEASE Bilateral    COLON SURGERY  2007   removed 18" of colon   ERCP W/ METAL STENT PLACEMENT  11/2005   Archie Endo 12/14/2005   ESOPHAGOGASTRODUODENOSCOPY (EGD) WITH ESOPHAGEAL DILATION     "several times'   ESOPHAGOGASTRODUODENOSCOPY (EGD) WITH PROPOFOL N/A 08/19/2022   Procedure: ESOPHAGOGASTRODUODENOSCOPY (EGD) WITH PROPOFOL;  Surgeon: Ladene Artist, MD;  Location: [place];  Service: Gastroenterology;  Laterality: N/A;   HAND SURGERY Bilateral    "plastic knuckles"   LAPAROSCOPIC CHOLECYSTECTOMY  01/2006    MELANOMA EXCISION     "back; left arm"   NASAL SEPTUM SURGERY     PELVIC FRACTURE SURGERY  1964   "busted in 2 places"   RESECTION DISTAL CLAVICAL Right 05/13/2016   Procedure: DISTAL CLAVICLE EXCISION;  Surgeon: Ninetta Lights, MD;  Location: [place];  Service: Orthopedics;  Laterality: Right;   SHOULDER ARTHROSCOPY WITH ROTATOR CUFF REPAIR AND SUBACROMIAL DECOMPRESSION Right 05/13/2016   Procedure: RIGHT SHOULDER SCOPE DEBRIDEMENT, ACROMIOPLASTY, ROTATOR CUFF REPAIR; RELEASE BICEPS TENDON AND DEBRIDEMENT LABRUM;  Surgeon: Ninetta Lights, MD;  Location: [place];  Service: Orthopedics;  Laterality: Right;   TIBIA FRACTURE SURGERY Left 2006   "put a steel rod in it"    Social:  PCP: Dr. Alden Hipp Social History   Socioeconomic History   Marital status: Divorced    Spouse name: Not on file   Number of children: Not on file   Years of education: Not on file   Highest education level: Not on file  Occupational History   Not on file  Tobacco Use   Smoking status: Former    Packs/day: 1.00    Years: 46.00    Total pack years: 46.00    Types: Cigarettes    Quit date: 11/26/2005    Years since quitting: 17.0   Smokeless tobacco: Never  Substance and Sexual Activity   Alcohol use: Yes    Alcohol/week: 9.0 standard drinks of alcohol    Types: 9 Shots of liquor per week    Comment: 05/23/2014 "3, 1 shot drinks maybe 3 times/wk"   Drug use: No   Sexual activity: Not Currently  Other Topics Concern   Not on file  Social History Narrative   Not on file   Social Determinants of Health   Financial Resource Strain: Not on file  Food Insecurity: Not on file  Transportation Needs: Not on file  Physical Activity: Not on file  Stress: Not on file  Social Connections: Not on file  Intimate Partner Violence: Not on file    Family History:  Family History  Problem Relation Age of Onset   Heart disease Mother    Heart disease Father    Cancer  Maternal Grandmother    Allergies: Allergies as of 12/27/2022   (No Known Allergies)    Review of Systems: A complete ROS was negative except as per HPI.   OBJECTIVE:   Physical Exam: Blood pressure (!) 94/44, pulse 90, temperature (!) 102.8 F (39.3 C), temperature source Oral, resp. rate (!) 23, weight 78.6 kg, SpO2 97 %.  Constitutional: ill-appearing male HENT: normocephalic atraumatic Cardiovascular: regular rate and rhythm Pulmonary/Chest: normal work  of breathing on room air, rhonchi noted L>R  Abdominal: soft, non-tender, non-distended Neurological: confused, oriented only to self, no facial droop or slurred speech, sensation intact of UE and LE bilaterally, unable to move RUE, 5/5 strength in right LE, left UE and LE Skin: warm and dry  Labs: CBC    Component Value Date/Time   WBC 8.2 12/27/2022 0946   RBC 3.56 (L) 12/27/2022 0946   HGB 10.3 (L) 12/27/2022 0946   HGB 13.7 11/20/2014 1140   HCT 31.6 (L) 12/27/2022 0946   HCT 41.8 11/20/2014 1140   PLT 187 12/27/2022 0946   PLT 172 11/20/2014 1140   MCV 88.8 12/27/2022 0946   MCV 100 11/20/2014 1140   MCH 28.9 12/27/2022 0946   MCHC 32.6 12/27/2022 0946   RDW 17.9 (H) 12/27/2022 0946   RDW 15.0 (H) 11/20/2014 1140   LYMPHSABS 0.4 (L) 12/27/2022 0946   LYMPHSABS 1.0 11/20/2014 1140   MONOABS 1.2 (H) 12/27/2022 0946   MONOABS 0.5 11/20/2014 1140   EOSABS 0.0 12/27/2022 0946   EOSABS 0.0 11/20/2014 1140   BASOSABS 0.0 12/27/2022 0946   BASOSABS 0.1 11/20/2014 1140     CMP     Component Value Date/Time   NA 135 12/27/2022 0946   NA 139 11/20/2014 1140   K 4.1 12/27/2022 0946   K 4.6 11/20/2014 1140   CL 100 12/27/2022 0946   CL 102 11/20/2014 1140   CO2 24 12/27/2022 0946   CO2 29 11/20/2014 1140   GLUCOSE 160 (H) 12/27/2022 0946   GLUCOSE 144 (H) 11/20/2014 1140   BUN 26 (H) 12/27/2022 0946   BUN 14 11/20/2014 1140   CREATININE 1.14 12/27/2022 0946   CREATININE 1.29 11/20/2014 1140   CALCIUM 8.7  (L) 12/27/2022 0946   CALCIUM 9.4 11/20/2014 1140   PROT 6.2 (L) 12/27/2022 0946   ALBUMIN 3.2 (L) 12/27/2022 0946   AST 34 12/27/2022 0946   AST 28 11/20/2014 1140   ALT 18 12/27/2022 0946   ALT 31 11/20/2014 1140   ALKPHOS 84 12/27/2022 0946   BILITOT 0.6 12/27/2022 0946   GFRNONAA >60 12/27/2022 0946   GFRNONAA 59 (L) 11/20/2014 1140   GFRNONAA >60 07/04/2013 1231   GFRAA >60 05/07/2016 0840   GFRAA >60 11/20/2014 1140   GFRAA >60 07/04/2013 1231    Imaging: MR BRAIN W WO CONTRAST CLINICAL DATA:  Code stroke. Patient found on floor this morning. Abnormal speech. Right arm weakness.  EXAM: MRI HEAD WITHOUT AND WITH CONTRAST  TECHNIQUE: Multiplanar, multiecho pulse sequences of the brain and surrounding structures were obtained without and with intravenous contrast.  CONTRAST:  7.47m GADAVIST GADOBUTROL 1 MMOL/ML IV SOLN  COMPARISON:  CT head and CTA head and neck 12/27/2022  FINDINGS: Brain: Diffusion-weighted images demonstrate scattered foci of restricted diffusion involving the high left frontal lobe. There is some involvement of the primary motor cortex. Scattered punctate areas are present in the high left parietal lobe as well. A single focus scratched at a single punctate focus of restricted diffusion is present in adjacent to the left lateral ventricle on image 83 of the axial diffusion sequence.  More remote cortical infarcts are present in the posteromedial left parietal lobe FLAIR signal is associated with the areas of acute infarction.  Confluent periventricular T2 hyperintensities are present bilaterally. The ventricles are proportionate to the degree of atrophy.  Deep brain nuclei are within normal limits. Remote lacunar infarcts are present in the thalami bilaterally.  The internal auditory canals  are within normal limits. The brainstem and cerebellum are within normal limits.  Postcontrast images demonstrate no pathologic  enhancement.  Vascular: Flow is present in the major intracranial arteries.  Skull and upper cervical spine: The craniocervical junction is normal. Upper cervical spine is within normal limits. Marrow signal is unremarkable.  Sinuses/Orbits: Mild mucosal thickening is present in the inferior left maxillary sinus. Anterior left ethmoid air cells are opacified. Minimal fluid is present in the sphenoid sinuses. Small mastoid effusions are present. Bilateral lens replacements are noted. Globes and orbits are otherwise unremarkable.  IMPRESSION: 1. Scattered foci of restricted diffusion involving the high left frontal lobe and high left parietal lobe compatible with acute/subacute nonhemorrhagic infarcts. 2. 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 3. Remote lacunar infarcts of the thalami bilaterally. 4. Confluent periventricular T2 hyperintensities bilaterally likely reflect the sequela of chronic microvascular ischemia. 5. Mild sinus disease.  These results were called by telephone at the time of interpretation on 12/27/2022 at 3:00 pm to provider [facility] , who verbally acknowledged these results.  Electronically Signed   By: San Morelle M.D.   On: 12/27/2022 15:00 DG Chest Port 1 View CLINICAL DATA:  Sepsis  EXAM: PORTABLE CHEST 1 VIEW  COMPARISON:  01/26/2021  FINDINGS: Pulmonary vascular congestion noted. No focal consolidation. There may be small left-sided pleural effusion. No pneumothorax identified. Aorta is calcified.  IMPRESSION: Pulmonary vascular congestion without focal consolidation. Possible small left-sided pleural effusion.  Electronically Signed   By: Sammie Bench M.D.   On: 12/27/2022 11:22 CT ANGIO HEAD NECK W WO CM W PERF (CODE STROKE) CLINICAL DATA:  77 year old male code stroke presentation.  EXAM: CT ANGIOGRAPHY HEAD AND NECK  CT PERFUSION BRAIN  TECHNIQUE: Multidetector CT imaging  of the head and neck was performed using the standard protocol during bolus administration of intravenous contrast. Multiplanar CT image reconstructions and MIPs were obtained to evaluate the vascular anatomy. Carotid stenosis measurements (when applicable) are obtained utilizing NASCET criteria, using the distal internal carotid diameter as the denominator.  Multiphase CT imaging of the brain was performed following IV bolus contrast injection. Subsequent parametric perfusion maps were calculated using RAPID software.  RADIATION DOSE REDUCTION: This exam was performed according to the departmental dose-optimization program which includes automated exposure control, adjustment of the mA and/or kV according to patient size and/or use of iterative reconstruction technique.  CONTRAST:  111m OMNIPAQUE IOHEXOL 350 MG/ML SOLN  COMPARISON:  Plain head CT 0943 hours today.  Neck CT 01/24/2015.  FINDINGS: CT Brain Perfusion Findings:  ASPECTS: 10  CBF (<30%) Volume: 053m  No CBF or CBV parameter abnormality.  Perfusion (Tmax>6.0s) volume: 51m28mMismatch Volume: Not applicable  Infarction Location:Not applicable  CTA NECK  Skeleton: Advanced cervical spine degeneration. No acute osseous abnormality identified.  Upper chest: Retained food or debris in the thoracic esophagus which is only mildly dilated. Upper lung scarring. No superior mediastinal lymphadenopathy.  Other neck: 2016 right level 2 lymphadenopathy does not persist. No neck mass identified.  Aortic arch: Extensive Calcified aortic atherosclerosis. 3 vessel arch configuration.  Right carotid system: Brachiocephalic artery and right CCA origin plaque without stenosis. Widespread right CCA plaque with no stenosis before the bifurcation. Bulky calcified plaque at the bifurcation and proximal right ICA but less than 50 % stenosis with respect to the distal vessel.  Left carotid system: Similar widespread  atherosclerosis. But more complex soft and calcified plaque at the left ICA origin and bulb resulting in  bulb level stenosis estimated at 60-65 % with respect to the distal vessel. Left ICA remains patent to the skull base.  Vertebral arteries: Proximal right subclavian artery plaque without stenosis. Calcified plaque at the right vertebral artery origin with severe stenosis (series 11, image 160). But the vessel remains patent and is fairly codominant to the skull base.  Extensive proximal left subclavian artery plaque. Calcified left vertebral artery origin and V1 segment with mild-to-moderate stenosis. Additional left V1 calcified plaque with only mild stenosis. The left vertebral artery is mildly dominant and patent to the skull base.  CTA HEAD  Posterior circulation: Non dominant right V4 segment is patent to the vertebrobasilar junction with normal PICA origin and no significant plaque or stenosis. Left V4 is mildly dominant with calcified plaque and mild to moderate stenosis on series 8, image 80 upstream of normal left PICA origin. Patent vertebrobasilar junction and basilar artery without stenosis. SCA and PCA origins are normal. Posterior communicating arteries are diminutive or absent. Bilateral PCA branches are within normal limits.  Anterior circulation: Both ICA siphons are patent. However, there is bulky calcified siphon plaque bilaterally, greater on the left with associated severe left cavernous segment stenosis (series 8, image 61 and series 12, image 116. Contralateral right siphon calcified plaque with mild to moderate cavernous segment stenosis. Patent carotid termini, MCA and ACA origins. Normal anterior communicating artery. Bilateral ACA branches are within normal limits. Left MCA M1 segment and trifurcation are patent without stenosis. Right MCA M1 segment and trifurcation are patent without stenosis. Right MCA branches are within normal limits. Left MCA  branches are patent with mild irregularity. No branch occlusion is identified.  Venous sinuses: Early contrast timing, grossly patent.  Anatomic variants: Dominant left vertebral artery.  Review of the MIP images confirms the above findings  IMPRESSION: 1. Negative for large vessel occlusion, and negative CT Perfusion.  2. Positive for advanced atherosclerosis in the head and neck: - complex plaque at the left ICA origin and bulb with estimated 60-65% stenosis. - up to Severe Left ICA cavernous segment stenosis due to bulky calcified plaque. - up to Moderate right ICA siphon stenosis. - Severe Right Vertebral Artery origin stenosis. - up to Moderate Left Vertebral Artery stenosis.  3. Partially visible retained food or debris in the thoracic esophagus, but only mild esophageal dilatation favoring esophageal dysmotility rather than achalasia. Follow-up esophagram would best evaluate further.  4.  Aortic Atherosclerosis (ICD10-I70.0).  CTP and Vascular findings were communicated to Dr. Curly Shores at 10:26 am on 12/27/2022 by text page via the [employer] [facility] [employer] messaging system.  Electronically Signed   By: Genevie Ann M.D.   On: 12/27/2022 10:29 CT HEAD CODE STROKE WO CONTRAST CLINICAL DATA:  Code stroke.  77 year old male  EXAM: CT HEAD WITHOUT CONTRAST  TECHNIQUE: Contiguous axial images were obtained from the base of the skull through the vertex without intravenous contrast.  RADIATION DOSE REDUCTION: This exam was performed according to the departmental dose-optimization program which includes automated exposure control, adjustment of the mA and/or kV according to patient size and/or use of iterative reconstruction technique.  COMPARISON:  Head CT 08/19/2022.  FINDINGS: Brain: Stable cerebral volume. No midline shift, ventriculomegaly, mass effect, evidence of mass lesion, intracranial hemorrhage or evidence of cortically based acute infarction. Gray-white matter differentiation  appears stable since last year and largely normal for age throughout the brain.  Vascular: Calcified atherosclerosis at the skull base. No suspicious intracranial vascular hyperdensity.  Skull: No acute osseous abnormality identified.  Sinuses/Orbits: Chronic paranasal sinus disease and prior sinus surgery. Sinus aeration not significantly changed from August. Tympanic cavities and mastoids remain clear.  Other: No acute orbit or scalp soft tissue finding.  ASPECTS [facility] Stroke Program Early CT Score)  Total score (0-10 with 10 being normal): 10  IMPRESSION: 1. Stable and normal for age noncontrast CT appearance of the brain. 2. ASPECTS 10.  Electronically Signed   By: Genevie Ann M.D.   On: 12/27/2022 09:50   EKG: personally reviewed my interpretation is sinus tachycardia.   ASSESSMENT & PLAN:   Assessment & Plan by Problem: Principal Problem:   Acute encephalopathy   [name] is a 77 y.o. 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 Has received about 3L IV fluids. Lactate 2.9>2.1>5.5. PCCM consulted. MAP improved so PCCM will follow for now. Will f/u on their recommendations. Patient oriented to only self but able to follow some commands. He was given Augmentin on 12/30 at [facility] ED for his pneumonia. CXR today did not show any focal consolidation but vascular congestion and small left pleural effusion. Resp panel positive for Flu A.  UA unremarkable. Pending blood and urine cultures. Will continue with IV fluids and broad spectrum  antibiotics.  -f/u STAT lactic acid, if rising still may need CT A/P to look for ischemic bowel -if develops recurrent hypotension or hemodynamically unstable, reach out to PCCM again -f/u blood cultures and urine culture -Tamiflu x 5 days -vanc and cefepime (day 1) -LR 150 ml/hr  Left MCA CVA MRI brain showed punctate acute strokes of left frontal and parietal along with remote lacunar infarcts of thalami bilaterally. CTA h/n no large occlusion but advanced atherosclerosis that includes complex plaque of left ICA and multiple areas of moderate to severe stenosis. Presented with RUE weakness and on exam unable to move much of his right arm. Will start DAPT and continue stroke workup.  -appreciate neurology assistance, stroke team to follow -Plavix 300 mg loading dose today followed by 75 mg daily  -ASA 81 mg daily -A1c, lipid panel -Echo -neuro checks -telemetry  T2DM Last A1c 6.4%. PTA on metformin. Glucose 150-160. NPO for now.   Esophageal outlet obstruction with PEG tube PCCM recommends barium esophogram when patient is stable.    Diet: NPO VTE: Enoxaparin IVF: LR, 150 ml/hr Code: Full  Prior to Admission Living Arrangement: Home, living with ex-wife Anticipated Discharge Location:  TBD Barriers to Discharge: sepsis and stroke workup  Dispo: Admit patient to Inpatient with expected length of stay greater than 2 midnights.  Signed: Angelique Blonder, DO Internal Medicine Resident PGY-1  12/27/2022, 7:42 PM

## 2022-12-28 ENCOUNTER — Inpatient Hospital Stay (HOSPITAL_COMMUNITY): Payer: Medicare Other

## 2022-12-28 DIAGNOSIS — J101 Influenza due to other identified influenza virus with other respiratory manifestations: Secondary | ICD-10-CM | POA: Diagnosis not present

## 2022-12-28 DIAGNOSIS — Z87891 Personal history of nicotine dependence: Secondary | ICD-10-CM

## 2022-12-28 DIAGNOSIS — G9340 Encephalopathy, unspecified: Secondary | ICD-10-CM | POA: Diagnosis not present

## 2022-12-28 DIAGNOSIS — A419 Sepsis, unspecified organism: Secondary | ICD-10-CM

## 2022-12-28 DIAGNOSIS — Z8673 Personal history of transient ischemic attack (TIA), and cerebral infarction without residual deficits: Secondary | ICD-10-CM

## 2022-12-28 DIAGNOSIS — I63132 Cerebral infarction due to embolism of left carotid artery: Secondary | ICD-10-CM | POA: Diagnosis not present

## 2022-12-28 DIAGNOSIS — I6389 Other cerebral infarction: Secondary | ICD-10-CM | POA: Diagnosis not present

## 2022-12-28 DIAGNOSIS — I639 Cerebral infarction, unspecified: Secondary | ICD-10-CM

## 2022-12-28 LAB — LIPID PANEL
Cholesterol: 77 mg/dL (ref 0–200)
HDL: 29 mg/dL — ABNORMAL LOW (ref >40)
Total CHOL/HDL Ratio: 2.7 RATIO
Triglycerides: 51 mg/dL (ref <150)
VLDL: 10 mg/dL (ref 0–40)

## 2022-12-28 LAB — URINE CULTURE: Culture: NO GROWTH

## 2022-12-28 LAB — BASIC METABOLIC PANEL
Anion gap: 11 (ref 5–15)
BUN: 18 mg/dL (ref 8–23)
CO2: 22 mmol/L (ref 22–32)
Calcium: 8.3 mg/dL — ABNORMAL LOW (ref 8.9–10.3)
Chloride: 103 mmol/L (ref 98–111)
Creatinine, Ser: 0.83 mg/dL (ref 0.61–1.24)
GFR, Estimated: >60 mL/min (ref >60)
Glucose, Bld: 136 mg/dL — ABNORMAL HIGH (ref 70–99)
Potassium: 3.5 mmol/L (ref 3.5–5.1)
Sodium: 136 mmol/L (ref 135–145)

## 2022-12-28 LAB — CBC
HCT: 30.6 % — ABNORMAL LOW (ref 39.0–52.0)
Hemoglobin: 9.6 g/dL — ABNORMAL LOW (ref 13.0–17.0)
MCH: 28.4 pg (ref 26.0–34.0)
MCHC: 31.4 g/dL (ref 30.0–36.0)
MCV: 90.5 fL (ref 80.0–100.0)
Platelets: 168 10*3/uL (ref 150–400)
RBC: 3.38 MIL/uL — ABNORMAL LOW (ref 4.22–5.81)
RDW: 18.4 % — ABNORMAL HIGH (ref 11.5–15.5)
WBC: 3.8 10*3/uL — ABNORMAL LOW (ref 4.0–10.5)
nRBC: 0 % (ref 0.0–0.2)

## 2022-12-28 LAB — ECHOCARDIOGRAM COMPLETE
AR max vel: 2.57 cm2
AV Area VTI: 2.99 cm2
AV Area mean vel: 2.76 cm2
AV Mean grad: 3 mmHg
AV Peak grad: 5.7 mmHg
Ao pk vel: 1.2 m/s
Area-P 1/2: 3.84 cm2
Height: 73 in
S' Lateral: 3.7 cm
Weight: 2772.5 oz

## 2022-12-28 LAB — GLUCOSE, CAPILLARY: Glucose-Capillary: 113 mg/dL — ABNORMAL HIGH (ref 70–99)

## 2022-12-28 LAB — LACTIC ACID, PLASMA
Lactic Acid, Venous: 1.1 mmol/L (ref 0.5–1.9)
Lactic Acid, Venous: 1.9 mmol/L (ref 0.5–1.9)

## 2022-12-28 LAB — HEMOGLOBIN A1C
Hgb A1c MFr Bld: 6.2 % — ABNORMAL HIGH (ref 4.8–5.6)
Mean Plasma Glucose: 131 mg/dL

## 2022-12-28 LAB — MRSA NEXT GEN BY PCR, NASAL: MRSA by PCR Next Gen: NOT DETECTED

## 2022-12-28 MED ORDER — OSELTAMIVIR PHOSPHATE 75 MG PO CAPS
75.0000 mg | ORAL_CAPSULE | Freq: Two times a day (BID) | ORAL | Status: DC
  Administered 2022-12-28: 75 mg via ORAL
  Filled 2022-12-28: qty 1

## 2022-12-28 MED ORDER — ATORVASTATIN CALCIUM 40 MG PO TABS
40.0000 mg | ORAL_TABLET | Freq: Every day | ORAL | Status: DC
  Administered 2022-12-29 – 2023-01-06 (×8): 40 mg
  Filled 2022-12-28 (×8): qty 1

## 2022-12-28 MED ORDER — ATORVASTATIN CALCIUM 40 MG PO TABS
40.0000 mg | ORAL_TABLET | Freq: Every day | ORAL | Status: DC
  Administered 2022-12-28: 40 mg via ORAL
  Filled 2022-12-28: qty 1

## 2022-12-28 MED ORDER — LACTATED RINGERS IV BOLUS
1000.0000 mL | Freq: Once | INTRAVENOUS | Status: AC
  Administered 2022-12-28: 1000 mL via INTRAVENOUS

## 2022-12-28 MED ORDER — OSELTAMIVIR PHOSPHATE 75 MG PO CAPS
75.0000 mg | ORAL_CAPSULE | Freq: Two times a day (BID) | ORAL | Status: DC
  Administered 2022-12-28 – 2022-12-31 (×7): 75 mg
  Filled 2022-12-28 (×8): qty 1

## 2022-12-28 NOTE — Progress Notes (Signed)
PT Cancellation Note  Patient Details Name: [name] MRN: [id number] DOB: [birth year]   Cancelled Treatment:    Reason Eval/Treat Not Completed: Patient at procedure or test/unavailable.  Reattempt as time and pt allow.   Ramond Dial 12/28/2022, 11:43 AM  Mee Hives, PT PhD Acute Rehab Dept. Number: [place] [place]

## 2022-12-28 NOTE — Progress Notes (Signed)
  Echocardiogram 2D Echocardiogram has been performed.  Ronny Flurry 12/28/2022, 9:06 AM

## 2022-12-28 NOTE — Evaluation (Signed)
Physical Therapy Evaluation Patient Details Name: [name] MRN: [id number] DOB: 1946/05/03 Today's Date: 12/28/2022  History of Present Illness  [name] is a 77 y.o. male who presents after being found on floor unable to talk and get up.  MRI positive for scattered foci of restricted diffusion involving the high left frontal lobe and high left parietal lobe compatible with acute/subacute nonhemorrhagic infarcts.  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  He also tested positive for the flu A and possible aspiration PNA.  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  Pt is maybe mildly fatigued but did note tendency to list to R with turns and obstacle clearance.  Follow acutely for goals of PT and focus on strength, balance and safety to walk.  Follow acute PT goals as are listed below, with help at home with his ex wife.       Recommendations for follow up therapy are one component of a multi-disciplinary discharge planning process, led by the attending physician.  Recommendations may be updated based on patient status, additional functional criteria and insurance authorization.  Follow Up Recommendations Home health PT      Assistance Recommended at Discharge Intermittent Supervision/Assistance  Patient can return home with the following  A little help with walking and/or transfers;A little help with bathing/dressing/bathroom;Assistance with cooking/housework;Assist for transportation;Help with stairs or ramp for entrance    Equipment Recommendations Rolling walker (2 wheels)  Recommendations for Other Services       Functional Status Assessment  Patient has had a recent decline in their functional status and demonstrates the ability to make significant improvements in function in a reasonable and predictable amount of time.     Precautions / Restrictions Precautions Precautions: Fall Precaution Comments: peg tube, flu + Restrictions Weight Bearing Restrictions: No      Mobility  Bed Mobility Overal bed mobility: Needs Assistance Bed Mobility: Supine to Sit, Sit to Supine     Supine to sit: Min guard Sit to supine: Min guard        Transfers Overall transfer level: Needs assistance Equipment used: None Transfers: Sit to/from Stand Sit to Stand: Min guard           General transfer comment: walked with some minor losses of balance to R side    Ambulation/Gait Ambulation/Gait assistance: Min guard Gait Distance (Feet): 150 Feet Assistive device: 1 person hand held assist Gait Pattern/deviations: Step-through pattern, Decreased stride length, Wide base of support, Decreased weight shift to right Gait velocity: reduced Gait velocity interpretation: <1.31 ft/sec, indicative of household ambulator Pre-gait activities: standing balance ck General Gait Details: mild loss of balance to R side esp during turns  Financial trader Rankin (Stroke Patients Only)       Balance Overall balance assessment: Needs assistance Sitting-balance support: Feet supported Sitting balance-Leahy Scale: Good     Standing balance support: Single extremity supported Standing balance-Leahy Scale: Good Standing balance comment: fair dynamic standing but less than fair dynamic with gait at times  Pertinent Vitals/Pain Pain Assessment Pain Assessment: Faces Faces Pain Scale: No hurt    Home Living Family/patient expects to be discharged to:: Private residence Living Arrangements: Spouse/significant other Available Help at Discharge: Available 24  hours/day Type of Home: House Home Access: Stairs to enter Entrance Stairs-Rails:  (has rail but unsure what side) Entrance Stairs-Number of Steps: 2   Home Layout: One level Home Equipment: Shower seat;Hand held Chief Technology Officer (4 wheels) Additional Comments: per pt report is still working    Prior Function Prior Level of Function : Independent/Modified Independent             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  Decreased wrist extension 2+/5 with only trace digit flexion and extension noted.  History of RA in bilateral hands with ulnar deviation noted.  Wrist sits in flexion if held up.  Placed bed sheets folded underneath for support when resting in bed. RUE Sensation: WNL (per gross assessment) RUE Coordination: decreased fine motor;decreased gross motor    Lower Extremity Assessment Lower Extremity Assessment: RLE deficits/detail RLE Deficits / Details: R hio has incoordination issue RLE Coordination: decreased gross motor    Cervical / Trunk Assessment Cervical / Trunk Assessment: Normal  Communication   Communication: No difficulties  Cognition Arousal/Alertness: Awake/alert Behavior During Therapy: WFL for tasks assessed/performed Overall Cognitive Status: Within Functional Limits for tasks assessed                                          General Comments General comments (skin integrity, edema, etc.): Pt was seen for gait on hallway to see how he controlled balance and noted R hip is mildly incoordinated, would benefit from follow up therapy to get control of unsupported gait    Exercises     Assessment/Plan    PT Assessment Patient  needs continued PT services  PT Problem List Decreased activity tolerance;Decreased balance;Decreased coordination;Decreased mobility;Decreased knowledge of use of DME       PT Treatment Interventions DME instruction;Gait training;Stair training;Functional mobility training;Therapeutic activities;Therapeutic exercise;Balance training;Neuromuscular re-education;Patient/family education    PT Goals (Current goals can be found in the Care Plan section)  Acute Rehab PT Goals Patient Stated Goal: to get home and stronger PT Goal Formulation: With patient Time For Goal Achievement: 01/11/23 Potential to Achieve Goals: Good    Frequency Min 4X/week     Co-evaluation               AM-PAC PT "6 Clicks" Mobility  Outcome Measure Help needed turning from your back to your side while in a flat bed without using bedrails?: A Little Help needed moving from lying on your back to sitting on the side of a flat bed without using bedrails?: A Little Help needed moving to and from a bed to a chair (including a wheelchair)?: A Little Help needed standing up from a chair using your arms (e.g., wheelchair or bedside chair)?: A Little Help needed to walk in hospital room?: A Little Help needed climbing 3-5 steps with a railing? : A Lot 6 Click Score: 17    End of Session Equipment Utilized  During Treatment: Gait belt Activity Tolerance: Patient tolerated treatment well;Patient limited by fatigue Patient left: in bed;with call bell/phone within reach Nurse Communication: Mobility status PT Visit Diagnosis: Unsteadiness on feet (R26.81);Muscle weakness (generalized) (M62.81);Difficulty in walking, not elsewhere classified (R26.2)    Time: 3149-7026 PT Time Calculation (min) (ACUTE ONLY): 24 min   Charges:   PT Evaluation $PT Eval Moderate Complexity: 1 Mod PT Treatments $Gait Training: 8-22 mins       Ramond Dial 12/28/2022, 3:09 PM  Mee Hives, PT PhD Acute Rehab Dept. Number: [place] [facility] [place]

## 2022-12-28 NOTE — Progress Notes (Addendum)
Subjective:  No acute events reported overnight.   Patient states he still feels a little disoriented today and has a headache, denies SOB or cough. He reports he usually does not take PO and uses his GJ tube.   Spoke with patient's son over the phone who states patient was taking prednisone prior to admission but had his lovenox discontinued a few months ago by [employer].   Objective:  Vital signs in last 24 hours: Vitals:   12/28/22 1030 12/28/22 1100 12/28/22 1130 12/28/22 1146  BP: (!) 144/78 121/83 119/63   Pulse: 73 80 74   Resp: 20 (!) 22 17   Temp:    98.4 F (36.9 C)  TempSrc:    Oral  SpO2: 99% 100% 99%   Weight:      Height:       Weight change:   Intake/Output Summary (Last 24 hours) at 12/28/2022 1234 Last data filed at 12/28/2022 1214 Gross per 24 hour  Intake 2254.68 ml  Output 600 ml  Net 1654.68 ml   Physical Exam: Gen: Alert, in no acute distress CV: RRR, no murmurs, rubs, or gallops.  Pulm: Normal respiratory effort. Coarse rhonchi bilaterally.  Abd: GJ tube in place, appears clean and intact. Neuro: Alert and conversational. Oriented to self and month but not year. Can extend RUE and LUE, 0/5 R grip and 5/5 L grip, normal sensation to touch symmetric in bilateral UE/LE. 5/5 RLE and LLE strength. CN II-XII grossly intact.  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  Sepsis with Acute Encephalopathy Recent hx of pneumonia Influenza A Lactate improved to 1.1 today and patient has remained hemodynamically stable with improved BP. Afebrile today. Patient's mental status much improved on exam  today and he is able to engage in discussion with team. Urine culture negative, blood cultures pending, no growth <24hrs. Will administer LR bolus today. Will consider switching to fluid boluses via PEG tomorrow. -f/u MRSA nasal swab cultures -Continue vancomycin and cefipime (day 2) -Continue Tamiflu x 5 days total  Left MCA CVA Patient has improved mobility of the RUE on exam, continues to have difficulty with right hand. LVEF 60-65% on echo. 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 Patient also has history of GI bleed with anticoagulation, but has tolerated this dose of Lovenox well. -Neuro stroke team following, f/u recommendations -PT/OT/SLP -Continue ASA '81mg'$  -Continue clopidogrel '75mg'$  -Atorvastatin 40 mg daily  -Neuro checks -Telemetry -Pending A1c  T2DM Last A1c 6.4%. PTA on metformin, glucose 130s-160s overnight. Consider restarting metformin once diet is restarted.  -RD consult for diet  Chronic Steroids Per chart review, patient has been on chronic prednisone for many years. He was noted to have been on '2mg'$  prednisone taper in mid-November to '1mg'$  starting beginning of December x4 weeks. Given this timeline, steroid taper should have been completed upon admission. Plan to hold steroids for now and will monitor BP. If patient becomes hypotensive, will start stress dose steroids.   Esophageal Outlet Obstruction with PEG tube PCCM recommended barium esophogram. Patient has been PEG-dependent prior to admission and  not taking PO. Plan for patient to follow up with outpatient GI for further workup if needed.   Diet: NPO Bowel: none VTE: SCDs  IVF: LR bolus Code: Full     Prior to Admission Living Arrangement: at home w/ ex-wife Anticipated Discharge Location: TBD Barriers to Discharge: continued management Dispo: Anticipated discharge in approximately more than 2 day(s).    LOS: 1 day   [name], Medical Student 12/28/2022, 12:34 PM   Attestation for Student Documentation:  I personally was present and performed or re-performed the history, physical exam and medical decision-making activities of this service and have verified that the service and findings are accurately documented in the student's note.  Starlyn Skeans, MD 12/28/2022, 6:19 PM

## 2022-12-28 NOTE — Progress Notes (Signed)
Lactate has cleared; BP good.  We are available PRN.  Erskine Emery MD PCCM

## 2022-12-28 NOTE — Evaluation (Signed)
Occupational Therapy Evaluation Patient Details Name: [name] MRN: [id number] DOB: 12/20/1946 Today's Date: 12/28/2022   History of Present Illness [name] is a 77 y.o. male who presents after being found on floor unable to talk and get up.  MRI positive for scattered foci of restricted diffusion involving the high left frontal lobe and high left parietal lobe compatible with acute/subacute nonhemorrhagic infarcts.  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  He also tested positive for the flu A and possible aspiration PNA.  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  Min assist for simulated selfcare tasks.  He exhibits good proximal strength in the RUE 4/5 at the shoulder and elbow.  Strength is 2+/5 in the wrist extensors and only trace flexion/extension noted in the digits.  Feel he will benefit from acute care OT at this time to help increase AROM and strength in the RUE as well as increase functional performance of ADL tasks.        Recommendations for follow up therapy are one component of a multi-disciplinary discharge planning process, led by the attending physician.  Recommendations may be updated based on patient status, additional functional criteria and insurance authorization.   Follow Up Recommendations  Outpatient OT     Assistance Recommended at Discharge PRN  Patient can return home with the following A little help with bathing/dressing/bathroom;Assistance with cooking/housework;Assist for transportation;Help with stairs or ramp for entrance    Functional Status Assessment  Patient has had a recent decline in their functional status and demonstrates the  ability to make significant improvements in function in a reasonable and predictable amount of time.  Equipment Recommendations  None recommended by OT       Precautions / Restrictions Precautions Precautions: Fall Precaution Comments: peg tube, flu + Restrictions Weight Bearing Restrictions: No      Mobility Bed Mobility Overal bed mobility: Needs Assistance Bed Mobility: Supine to Sit, Sit to Supine     Supine to sit: Min guard Sit to supine: Min guard   General bed mobility comments: increased time and pt needing two attempts to complete upright trunk    Transfers Overall transfer level: Needs assistance Equipment used: None Transfers: Sit to/from Stand, Bed to chair/wheelchair/BSC Sit to Stand: Min guard     Step pivot transfers: Min guard     General transfer comment: Pt able to ambulate in the hallway at min guard without assistive device.      Balance Overall balance assessment: Needs assistance Sitting-balance support: Feet unsupported Sitting balance-Leahy Scale: Good Sitting balance - Comments: Moderate posterior bias when attempting to bring LEs up and cross over for donning socks.   Standing balance support: During functional activity Standing balance-Leahy Scale: Good                             ADL either performed or assessed with clinical judgement   ADL Overall ADL's : Needs assistance/impaired Eating/Feeding: Set up;Sitting   Grooming: Minimal assistance;Sitting Grooming Details (indicate cue type and reason): simulated Upper Body Bathing: Minimal assistance;Sitting Upper Body Bathing Details (indicate cue type and reason): simulated Lower Body Bathing: Min guard;Sit to/from stand Lower  Body Bathing Details (indicate cue type and reason): simulated Upper Body Dressing : Minimal assistance;Sitting Upper Body Dressing Details (indicate cue type and reason): simulated Lower Body Dressing: Moderate assistance;Sit to/from stand    Toilet Transfer: Min guard;Ambulation   Toileting- Clothing Manipulation and Hygiene: Min guard;Sit to/from stand       Functional mobility during ADLs: Min guard (no device) General ADL Comments: Pt currently with RUE wrist and hand impairment secondary to CVA.  Issued handout on AAROM exercises to be completed daily with return demonstration.  Recommend follow-up outpatient for continued rehab post acute stay.     Vision Baseline Vision/History: 1 Wears glasses (reading glasses) Patient Visual Report: No change from baseline Vision Assessment?: No apparent visual deficits     Perception  NT   Praxis  NT    Pertinent Vitals/Pain Pain Assessment Pain Assessment: Faces Pain Location: left hand Pain Descriptors / Indicators: Discomfort Pain Intervention(s): Limited activity within patient's tolerance, Monitored during session     Hand Dominance Right   Extremity/Trunk Assessment Upper Extremity Assessment Upper Extremity Assessment:  (LUE with strength 4/5 throughout except digits secondary to rheumatoid arthritis.) RUE Deficits / Details: Proximal strength WFLs for shoulder and elbow.  Decreased wrist extension 2+/5 with only trace digit flexion and extension noted.  History of RA in bilateral hands with ulnar deviation noted.  Wrist sits in flexion if held up.  Placed bed sheets folded underneath for support when resting in bed. RUE Sensation: WNL (per gross assessment) RUE Coordination: decreased fine motor;decreased gross motor   Lower Extremity Assessment Lower Extremity Assessment: Defer to PT evaluation   Cervical / Trunk Assessment Cervical / Trunk Assessment: Normal   Communication Communication Communication: No difficulties   Cognition Arousal/Alertness: Awake/alert Behavior During Therapy: WFL for tasks assessed/performed Overall Cognitive Status: Within Functional Limits for tasks assessed                                                   Home Living Family/patient expects to be discharged to:: Private residence Living Arrangements: Spouse/significant other (staying with ex-wife) Available Help at Discharge: Available 24 hours/day Type of Home: House Home Access: Stairs to enter [employer] of Steps: 2 Entrance Stairs-Rails:  (has rail but unsure what side) Home Layout: One level     Bathroom Shower/Tub: Occupational psychologist: Standard Bathroom Accessibility: Yes   Home Equipment: Shower seat;Hand held Chief Technology Officer (4 wheels)          Prior Functioning/Environment Prior Level of Function : Independent/Modified Independent               ADLs Comments: He reports he can complete his own selfcare        OT Problem List: Decreased strength;Decreased range of motion;Decreased coordination;Impaired balance (sitting and/or standing);Pain;Impaired UE functional use;Decreased knowledge of use of DME or AE      OT Treatment/Interventions: Self-care/ADL training;Therapeutic exercise;Patient/family education;Balance training;Neuromuscular education;Therapeutic activities;DME and/or AE instruction;Cognitive remediation/compensation    OT Goals(Current goals can be found in the care plan section) Acute Rehab OT Goals Patient Stated Goal: Pt wants to get his hand working better. OT Goal Formulation: With patient Time For Goal Achievement: 01/11/23 Potential to Achieve Goals: Good  OT Frequency: Min 2X/week       AM-PAC OT "6 Clicks" Daily Activity     Outcome Measure  Help from another person eating meals?: A Little Help from another person taking care of personal grooming?: A Little Help from another person toileting, which includes using toliet, bedpan, or urinal?: A Little Help from another person bathing (including washing, rinsing, drying)?: A Little Help from another person to put on and taking off regular upper body clothing?: A Little Help from another person to put on  and taking off regular lower body clothing?: A Little 6 Click Score: 18   End of Session Equipment Utilized During Treatment: Gait belt Nurse Communication: Mobility status  Activity Tolerance: Patient tolerated treatment well Patient left: in bed;with call bell/phone within reach  OT Visit Diagnosis: Unsteadiness on feet (R26.81);Muscle weakness (generalized) (M62.81);Pain Pain - Right/Left: Left Pain - part of body: Hand                Time: 1040-1115 OT Time Calculation (min): 35 min Charges:  OT General Charges $OT Visit: 1 Visit OT Evaluation $OT Eval Moderate Complexity: 1 Mod OT Treatments $Self Care/Home Management : 8-22 mins  Sequoyah Counterman OTR/L 12/28/2022, 1:10 PM

## 2022-12-28 NOTE — Evaluation (Signed)
Clinical/Bedside Swallow Evaluation Patient Details  Name: [name] MRN: [id number] Date of Birth: June 06, 1946  Today's Date: 12/28/2022 Time: SLP Start Time (ACUTE ONLY): 4098 SLP Stop Time (ACUTE ONLY): 1430 SLP Time Calculation (min) (ACUTE ONLY): 19 min  Past Medical History:  Past Medical History:  Diagnosis Date   Aortic aneurysm ([place]) 05/2017   Chronic back pain    Diabetes mellitus without complication (HCC)    Dyslipidemia    GERD (gastroesophageal reflux disease)    H/O hiatal hernia    Hypertension    Melanoma ([place])    "back; left arm"   PONV (postoperative nausea and vomiting)    Rheumatoid arthritis ([place])    Spasm of esophagus    Past Surgical History:  Past Surgical History:  Procedure Laterality Date   aneursym repair     aneursym repair and stent placement  05/2017   BIOPSY  08/19/2022   Procedure: BIOPSY;  Surgeon: Ladene Artist, MD;  Location: [facility] ENDOSCOPY;  Service: Gastroenterology;;   CARPAL TUNNEL RELEASE Bilateral    COLON SURGERY  2007   removed 18" of colon   ERCP W/ METAL STENT PLACEMENT  11/2005   Archie Endo 12/14/2005   ESOPHAGOGASTRODUODENOSCOPY (EGD) WITH ESOPHAGEAL DILATION     "several times'   ESOPHAGOGASTRODUODENOSCOPY (EGD) WITH PROPOFOL N/A 08/19/2022   Procedure: ESOPHAGOGASTRODUODENOSCOPY (EGD) WITH PROPOFOL;  Surgeon: Ladene Artist, MD;  Location: [place];  Service: Gastroenterology;  Laterality: N/A;   HAND SURGERY Bilateral    "plastic knuckles"   LAPAROSCOPIC CHOLECYSTECTOMY  01/2006   MELANOMA EXCISION     "back; left arm"   NASAL SEPTUM SURGERY     PELVIC FRACTURE SURGERY  1964   "busted in 2 places"   RESECTION DISTAL CLAVICAL Right 05/13/2016   Procedure: DISTAL CLAVICLE EXCISION;  Surgeon: Ninetta Lights, MD;  Location: [place];  Service: Orthopedics;  Laterality: Right;   SHOULDER ARTHROSCOPY WITH ROTATOR CUFF REPAIR AND SUBACROMIAL DECOMPRESSION Right 05/13/2016   Procedure: RIGHT SHOULDER  SCOPE DEBRIDEMENT, ACROMIOPLASTY, ROTATOR CUFF REPAIR; RELEASE BICEPS TENDON AND DEBRIDEMENT LABRUM;  Surgeon: Ninetta Lights, MD;  Location: [place];  Service: Orthopedics;  Laterality: Right;   TIBIA FRACTURE SURGERY Left 2006   "put a steel rod in it"   HPI:  Pt is a 77 yo male, found to have PNA one week prior to admission, presented 1/1 with R sided weakness. MRI suggestive of acute/subacute infarcts in the high L frontal lobe and high L parietal lobe. CXR without focal consolidation but CT head/neck showed retained food or debris in the thoracic esophagus which was only mildly dilate, favoring esophageal dysmotility rather than achalasia. Pt also Flu A +. Pt most recently seen by SLP in August 2023, at which time he was experiencing GIB, reflux esophagitis, and duodenal ulcers and subsequently was on full liquid diet. PMH includes: SCC of tongue s/p XRT, dysphagia (most recent MBS in 2022 at [facility] without aspiration and wtih improved EAT and DIGEST scores), erosive esophagitis with recurrent esophageal strictures, duodenal ulcer disease, esophageal outlet obstructions s/p multiple dilations and GJ tube placement for FTT, DMII, HTN, RA.    Assessment / Plan / Recommendation  Clinical Impression  Pt has an extensive history of both oropharyngeal and esophageal history, for which he receives most of his OP care at [facility]. When last seen by this SLP (August 2023) he was consuming liquids only. He says that in the interim, he has completely ceased all PO intake,  using PEG only, to give his esophagus rest. He insists that he has not had anything PO, and that he has been keeping up with thorough oral hygiene. If there is risk for aspiration, it may most likely be post-prandial. Pt was not observed with any POs today but oral motor exam was nonrevealing, so suspect that he at least would not have experienced an acute change in function. Would defer any follow up for his more chronic dysphagia  to his primary providers, which sounds like it was primarily GI at this point. SLP Visit Diagnosis: Dysphagia, unspecified (R13.10)    Aspiration Risk  Moderate aspiration risk    Diet Recommendation NPO;Alternative means - long-term   Medication Administration: Via alternative means    Other  Recommendations Oral Care Recommendations: Oral care QID    Recommendations for follow up therapy are one component of a multi-disciplinary discharge planning process, led by the attending physician.  Recommendations may be updated based on patient status, additional functional criteria and insurance authorization.  Follow up Recommendations Outpatient SLP      Assistance Recommended at Discharge    Functional Status Assessment Patient has not had a recent decline in their functional status  Frequency and Duration            Prognosis Prognosis for Safe Diet Advancement: Guarded Barriers to Reach Goals: Time post onset      Swallow Study   General HPI: Pt is a 77 yo male, found to have PNA one week prior to admission, presented 1/1 with R sided weakness. MRI suggestive of acute/subacute infarcts in the high L frontal lobe and high L parietal lobe. CXR without focal consolidation but CT head/neck showed retained food or debris in the thoracic esophagus which was only mildly dilate, favoring esophageal dysmotility rather than achalasia. Pt also Flu A +. Pt most recently seen by SLP in August 2023, at which time he was experiencing GIB, reflux esophagitis, and duodenal ulcers and subsequently was on full liquid diet. PMH includes: SCC of tongue s/p XRT, dysphagia (most recent MBS in 2022 at [facility] without aspiration and wtih improved EAT and DIGEST scores), erosive esophagitis with recurrent esophageal strictures, duodenal ulcer disease, esophageal outlet obstructions s/p multiple dilations and GJ tube placement for FTT, DMII, HTN, RA. Type of Study: Bedside Swallow Evaluation Previous Swallow  Assessment: see HPI Diet Prior to this Study: NPO Temperature Spikes Noted: Yes (102.8) Respiratory Status: Room air History of Recent Intubation: No Behavior/Cognition: Alert;Cooperative;Distractible Oral Cavity Assessment: Within Functional Limits Oral Care Completed by SLP: No Oral Cavity - Dentition: Missing dentition Vision: Functional for self-feeding Patient Positioning: Upright in bed Baseline Vocal Quality: Normal Volitional Cough: Strong Volitional Swallow: Able to elicit    Oral/Motor/Sensory Function Overall Oral Motor/Sensory Function: Within functional limits   Ice Chips Ice chips: Not tested   Thin Liquid Thin Liquid: Not tested    Nectar Thick Nectar Thick Liquid: Not tested   Honey Thick Honey Thick Liquid: Not tested   Puree Puree: Not tested   Solid     Solid: Not tested      Osie Bond., M.A. [place] Office [phone]  Secure chat preferred  12/28/2022,3:19 PM

## 2022-12-28 NOTE — Evaluation (Signed)
Speech Language Pathology Evaluation Patient Details Name: [name] MRN: [id number] DOB: 1946/08/10 Today's Date: 12/28/2022 Time: 04-1449 SLP Time Calculation (min) (ACUTE ONLY): 20 min  Problem List:  Patient Active Problem List   Diagnosis Date Noted   Sepsis without acute organ dysfunction ([place]) 12/28/2022   Influenza A 12/28/2022   Cerebrovascular accident (CVA) ([place]) 12/28/2022   Acute encephalopathy 12/27/2022   Acute esophagitis    Abdominal pain, epigastric    Closed left fibular fracture 08/22/2022   Reflux esophagitis 08/22/2022   Pressure injury of skin 08/20/2022   UGIB (upper gastrointestinal bleed) 08/18/2022   Pancytopenia ([place])    Immunosuppression ([place])    ILD (interstitial lung disease) ([place])    Diarrhea    DIC (disseminated intravascular coagulation) ([place])    AKI (acute kidney injury) ([place])    COVID-19 01/26/2021   Rheumatoid arthritis ([place]) 07/22/2015   Mucositis due to chemotherapy 06/25/2015   Mucositis due to radiation therapy 06/25/2015   SBO (small bowel obstruction) ([place]) 05/29/2015   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   H/O drug therapy 05/12/2015   Abnormal mental state 04/22/2015   Fast heart beat 04/22/2015   Febrile 04/18/2015   Candida esophagitis ([place]) 04/16/2015   Can't get food down 04/16/2015   Cancer of base of tongue ([place]) 04/02/2015   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   Fall 05/24/2014   Traumatic pneumothorax 05/23/2014   Diabetes mellitus, type 2 ([place]) 05/01/2014   Perirectal abscess 07/31/2013   Hemorrhoids 07/31/2013   Lentigines 11/13/2012   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   Atypical nevus 08/29/2012   Malignant melanoma in situ ([place]) 08/29/2012   Polypharmacy 08/23/2012   Dyspnea 08/14/2012   Rheumatoid arthritis(714.0) 08/14/2012   HTN (hypertension) 08/14/2012   GERD (gastroesophageal reflux disease) 08/14/2012   Emphysema 08/14/2012   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   Duodenal ulcer  with hemorrhage 10/15/2011   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   Esophageal stricture 10/15/2011   Past Medical History:  Past Medical History:  Diagnosis Date   Aortic aneurysm ([place]) 05/2017   Chronic back pain    Diabetes mellitus without complication (HCC)    Dyslipidemia    GERD (gastroesophageal reflux disease)    H/O hiatal hernia    Hypertension    Melanoma ([place])    "back; left arm"   PONV (postoperative nausea and vomiting)    Rheumatoid arthritis ([place])    Spasm of esophagus    Past Surgical History:  Past Surgical History:  Procedure Laterality Date   aneursym repair     aneursym repair and stent placement  05/2017   BIOPSY  08/19/2022   Procedure: BIOPSY;  Surgeon: Ladene Artist, MD;  Location: [place];  Service: Gastroenterology;;   CARPAL TUNNEL RELEASE Bilateral    COLON SURGERY  2007   removed 18" of colon   ERCP W/ METAL STENT PLACEMENT  11/2005   Archie Endo 12/14/2005   ESOPHAGOGASTRODUODENOSCOPY (EGD) WITH ESOPHAGEAL DILATION     "several times'   ESOPHAGOGASTRODUODENOSCOPY (EGD) WITH PROPOFOL N/A 08/19/2022   Procedure: ESOPHAGOGASTRODUODENOSCOPY (EGD) WITH PROPOFOL;  Surgeon: Ladene Artist, MD;  Location: [place];  Service: Gastroenterology;  Laterality: N/A;   HAND SURGERY Bilateral    "plastic knuckles"   LAPAROSCOPIC CHOLECYSTECTOMY  01/2006   MELANOMA EXCISION     "back; left arm"   NASAL SEPTUM SURGERY     PELVIC FRACTURE SURGERY  1964   "busted in 2 places"   RESECTION DISTAL CLAVICAL Right 05/13/2016   Procedure: DISTAL  CLAVICLE EXCISION;  Surgeon: Ninetta Lights, MD;  Location: [place];  Service: Orthopedics;  Laterality: Right;   SHOULDER ARTHROSCOPY WITH ROTATOR CUFF REPAIR AND SUBACROMIAL DECOMPRESSION Right 05/13/2016   Procedure: RIGHT SHOULDER SCOPE DEBRIDEMENT, ACROMIOPLASTY, ROTATOR CUFF REPAIR; RELEASE BICEPS TENDON AND DEBRIDEMENT LABRUM;  Surgeon: Ninetta Lights, MD;  Location: [place];   Service: Orthopedics;  Laterality: Right;   TIBIA FRACTURE SURGERY Left 2006   "put a steel rod in it"   HPI:  Pt is a 77 yo male, found to have PNA one week prior to admission, presented 1/1 with R sided weakness. MRI suggestive of acute/subacute infarcts in the high L frontal lobe and high L parietal lobe. CXR without focal consolidation but CT head/neck showed retained food or debris in the thoracic esophagus which was only mildly dilate, favoring esophageal dysmotility rather than achalasia. Pt also Flu A +. Pt most recently seen by SLP in August 2023, at which time he was experiencing GIB, reflux esophagitis, and duodenal ulcers and subsequently was on full liquid diet. PMH includes: SCC of tongue s/p XRT, dysphagia (most recent MBS in 2022 at [facility] without aspiration and wtih improved EAT and DIGEST scores), erosive esophagitis with recurrent esophageal strictures, duodenal ulcer disease, esophageal outlet obstructions s/p multiple dilations and GJ tube placement for FTT, DMII, HTN, RA.   Assessment / Plan / Recommendation Clinical Impression  Pt presents with acute cognitive-linguistic changes, reporting that he is typically independent. He believes that the year is 2002. He has significant difficulty with immediate recall of words, making delayed recall also very challenging. He has limited recall in general of events from today, not remembering at first that he had done any therapy evaluations, and when cued, not remembering any details. He has short sustained attention, which also makes it hard for him to maintain topic of conversation or participate in turn taking. This sometimes impacts his ability to listen to a full set of instructions before trying to respond. When cued to listen to all instructions before acting, he does have some improvements in performance. A modified version of the SLUMS was administered (pt says he is not able to hold a pen, so certain sections deferred), with pt scoring  14 out of a possible 24 points. He acknowledges that his performance today feels significantly different compared to his baseline. He would benefit from ongoing support upon discharge and OP SLP follow up.    SLP Assessment  SLP Recommendation/Assessment: Patient needs continued [facility] [place] [facility] SLP Visit Diagnosis: Cognitive communication deficit (R41.841)    Recommendations for follow up therapy are one component of a multi-disciplinary discharge planning process, led by the attending physician.  Recommendations may be updated based on patient status, additional functional criteria and insurance authorization.    Follow Up Recommendations  Outpatient SLP    Assistance Recommended at Discharge  Frequent or constant Supervision/Assistance  Functional Status Assessment Patient has not had a recent decline in their functional status  Frequency and Duration min 2x/week  2 weeks      SLP Evaluation Cognition  Overall Cognitive Status: Impaired/Different from baseline Arousal/Alertness: Awake/alert Orientation Level: Oriented to person;Oriented to place;Oriented to situation Year: Other (Comment) (2002) Attention: Sustained Sustained Attention: Impaired Sustained Attention Impairment: Verbal basic Memory: Impaired Memory Impairment: Storage deficit;Retrieval deficit;Decreased recall of new information Awareness: Impaired Awareness Impairment: Emergent impairment;Anticipatory impairment Problem Solving: Impaired Problem Solving Impairment: Verbal complex Safety/Judgment: Impaired       Comprehension  Auditory  Comprehension Overall Auditory Comprehension: Impaired Commands: Impaired Complex Commands: 50-74% accurate Interfering Components: Attention    Expression Expression Primary Mode of Expression: Verbal Verbal Expression Overall Verbal Expression: Impaired Initiation: No impairment Level of Generative/Spontaneous Verbalization: Conversation Naming:  Impairment Divergent:  (3 words in one minute) Verbal Errors: Aware of errors Pragmatics: Impairment Impairments: Topic maintenance;Turn Taking Interfering Components: Attention Non-Verbal Means of Communication: Not applicable Written Expression Dominant Hand: Right   Oral / Motor  Oral Motor/Sensory Function Overall Oral Motor/Sensory Function: Within functional limits Motor Speech Overall Motor Speech: Appears within functional limits for tasks assessed            Osie Bond., M.A. [place] Office [phone]  Secure chat preferred  12/28/2022, 3:35 PM

## 2022-12-28 NOTE — Progress Notes (Addendum)
STROKE TEAM PROGRESS NOTE   INTERVAL HISTORY No family is at the bedside.  Examination is notable for right wrist drop with no right hand movement.  Otherwise no weakness or sensory deficit noted.  Workup reveals patient positive for influenza A as well as things concerning for aspiration PNA with lactic acidosis, sepsis.concern for possible embolic stroke with new onset AF in the setting of acute illness.  MRI confirms scattered infarcts in the high left frontal and parietal lobes in a pattern compatible with emboli.  Remote lacunar infarcts are noted in thalami bilaterally and posterior medial left parietal cortex.  CT angiogram is negative for large vessel occlusion but changes of advanced atherosclerosis in the head and neck.  65% proximal left ICA origin and bulb stenosis and severe left cavernous segment stenosis. Vitals:   12/28/22 0500 12/28/22 0600 12/28/22 0630 12/28/22 0716  BP: (!) 119/42 (!) 101/47 131/72   Pulse: 69 62 70   Resp: '18 20 18   '$ Temp:    98.3 F (36.8 C)  TempSrc:    Oral  SpO2: 97% 97% 99%   Weight:      Height:       CBC:  Recent Labs  Lab 12/27/22 0946 12/28/22 0301  WBC 8.2 3.8*  NEUTROABS 6.6  --   HGB 10.3* 9.6*  HCT 31.6* 30.6*  MCV 88.8 90.5  PLT 187 166   Basic Metabolic Panel:  Recent Labs  Lab 12/27/22 0946 12/28/22 0301  NA 135 136  K 4.1 3.5  CL 100 103  CO2 24 22  GLUCOSE 160* 136*  BUN 26* 18  CREATININE 1.14 0.83  CALCIUM 8.7* 8.3*   Lipid Panel:  Recent Labs  Lab 12/28/22 0301  CHOL 77  TRIG 51  HDL 29*  CHOLHDL 2.7  VLDL 10  LDLCALC NOT CALCULATED   HgbA1c: No results for input(s): "HGBA1C" in the last 168 hours. Urine Drug Screen: No results for input(s): "LABOPIA", "COCAINSCRNUR", "LABBENZ", "AMPHETMU", "THCU", "LABBARB" in the last 168 hours.  Alcohol Level  Recent Labs  Lab 12/27/22 0946  ETH <10    IMAGING past 24 hours MR BRAIN W WO CONTRAST  Result Date: 12/27/2022 CLINICAL DATA:  Code stroke.  Patient found on floor this morning. Abnormal speech. Right arm weakness. EXAM: MRI HEAD WITHOUT AND WITH CONTRAST TECHNIQUE: Multiplanar, multiecho pulse sequences of the brain and surrounding structures were obtained without and with intravenous contrast. CONTRAST:  7.86m GADAVIST GADOBUTROL 1 MMOL/ML IV SOLN COMPARISON:  CT head and CTA head and neck 12/27/2022 FINDINGS: Brain: Diffusion-weighted images demonstrate scattered foci of restricted diffusion involving the high left frontal lobe. There is some involvement of the primary motor cortex. Scattered punctate areas are present in the high left parietal lobe as well. A single focus scratched at a single punctate focus of restricted diffusion is present in adjacent to the left lateral ventricle on image 83 of the axial diffusion sequence. More remote cortical infarcts are present in the posteromedial left parietal lobe FLAIR signal is associated with the areas of acute infarction. Confluent periventricular T2 hyperintensities are present bilaterally. The ventricles are proportionate to the degree of atrophy. Deep brain nuclei are within normal limits. Remote lacunar infarcts are present in the thalami bilaterally. The internal auditory canals are within normal limits. The brainstem and cerebellum are within normal limits. Postcontrast images demonstrate no pathologic enhancement. Vascular: Flow is present in the major intracranial arteries. Skull and upper cervical spine: The craniocervical junction is normal. Upper cervical  spine is within normal limits. Marrow signal is unremarkable. Sinuses/Orbits: Mild mucosal thickening is present in the inferior left maxillary sinus. Anterior left ethmoid air cells are opacified. Minimal fluid is present in the sphenoid sinuses. Small mastoid effusions are present. Bilateral lens replacements are noted. Globes and orbits are otherwise unremarkable. IMPRESSION: 1. Scattered foci of restricted diffusion involving the high  left frontal lobe and high left parietal lobe compatible with acute/subacute nonhemorrhagic infarcts. 2. 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 3. Remote lacunar infarcts of the thalami bilaterally. 4. Confluent periventricular T2 hyperintensities bilaterally likely reflect the sequela of chronic microvascular ischemia. 5. Mild sinus disease. These results were called by telephone at the time of interpretation on 12/27/2022 at 3:00 pm to provider [facility] , who verbally acknowledged these results. Electronically Signed   By: San Morelle M.D.   On: 12/27/2022 15:00   DG Chest Port 1 View  Result Date: 12/27/2022 CLINICAL DATA:  Sepsis EXAM: PORTABLE CHEST 1 VIEW COMPARISON:  01/26/2021 FINDINGS: Pulmonary vascular congestion noted. No focal consolidation. There may be small left-sided pleural effusion. No pneumothorax identified. Aorta is calcified. IMPRESSION: Pulmonary vascular congestion without focal consolidation. Possible small left-sided pleural effusion. Electronically Signed   By: Sammie Bench M.D.   On: 12/27/2022 11:22   CT ANGIO HEAD NECK W WO CM W PERF (CODE STROKE)  Result Date: 12/27/2022 CLINICAL DATA:  77 year old male code stroke presentation. EXAM: CT ANGIOGRAPHY HEAD AND NECK CT PERFUSION BRAIN TECHNIQUE: Multidetector CT imaging of the head and neck was performed using the standard protocol during bolus administration of intravenous contrast. Multiplanar CT image reconstructions and MIPs were obtained to evaluate the vascular anatomy. Carotid stenosis measurements (when applicable) are obtained utilizing NASCET criteria, using the distal internal carotid diameter as the denominator. Multiphase CT imaging of the brain was performed following IV bolus contrast injection. Subsequent parametric perfusion maps were calculated using RAPID software. RADIATION DOSE REDUCTION: This exam was performed according to the departmental  dose-optimization program which includes automated exposure control, adjustment of the mA and/or kV according to patient size and/or use of iterative reconstruction technique. CONTRAST:  125m OMNIPAQUE IOHEXOL 350 MG/ML SOLN COMPARISON:  Plain head CT 0943 hours today. Neck CT 01/24/2015. FINDINGS: CT Brain Perfusion Findings: ASPECTS: 10 CBF (<30%) Volume: 073m  No CBF or CBV parameter abnormality. Perfusion (Tmax>6.0s) volume: 40m72mismatch Volume: Not applicable Infarction Location:Not applicable CTA NECK Skeleton: Advanced cervical spine degeneration. No acute osseous abnormality identified. Upper chest: Retained food or debris in the thoracic esophagus which is only mildly dilated. Upper lung scarring. No superior mediastinal lymphadenopathy. Other neck: 2016 right level 2 lymphadenopathy does not persist. No neck mass identified. Aortic arch: Extensive Calcified aortic atherosclerosis. 3 vessel arch configuration. Right carotid system: Brachiocephalic artery and right CCA origin plaque without stenosis. Widespread right CCA plaque with no stenosis before the bifurcation. Bulky calcified plaque at the bifurcation and proximal right ICA but less than 50 % stenosis with respect to the distal vessel. Left carotid system: Similar widespread atherosclerosis. But more complex soft and calcified plaque at the left ICA origin and bulb resulting in bulb level stenosis estimated at 60-65 % with respect to the distal vessel. Left ICA remains patent to the skull base. Vertebral arteries: Proximal right subclavian artery plaque without stenosis. Calcified plaque at the right vertebral artery origin with severe stenosis (series 11, image 160). But the vessel remains patent and is fairly codominant to the skull base. Extensive proximal left  subclavian artery plaque. Calcified left vertebral artery origin and V1 segment with mild-to-moderate stenosis. Additional left V1 calcified plaque with only mild stenosis. The left  vertebral artery is mildly dominant and patent to the skull base. CTA HEAD Posterior circulation: Non dominant right V4 segment is patent to the vertebrobasilar junction with normal PICA origin and no significant plaque or stenosis. Left V4 is mildly dominant with calcified plaque and mild to moderate stenosis on series 8, image 80 upstream of normal left PICA origin. Patent vertebrobasilar junction and basilar artery without stenosis. SCA and PCA origins are normal. Posterior communicating arteries are diminutive or absent. Bilateral PCA branches are within normal limits. Anterior circulation: Both ICA siphons are patent. However, there is bulky calcified siphon plaque bilaterally, greater on the left with associated severe left cavernous segment stenosis (series 8, image 61 and series 12, image 116. Contralateral right siphon calcified plaque with mild to moderate cavernous segment stenosis. Patent carotid termini, MCA and ACA origins. Normal anterior communicating artery. Bilateral ACA branches are within normal limits. Left MCA M1 segment and trifurcation are patent without stenosis. Right MCA M1 segment and trifurcation are patent without stenosis. Right MCA branches are within normal limits. Left MCA branches are patent with mild irregularity. No branch occlusion is identified. Venous sinuses: Early contrast timing, grossly patent. Anatomic variants: Dominant left vertebral artery. Review of the MIP images confirms the above findings IMPRESSION: 1. Negative for large vessel occlusion, and negative CT Perfusion. 2. Positive for advanced atherosclerosis in the head and neck: - complex plaque at the left ICA origin and bulb with estimated 60-65% stenosis. - up to Severe Left ICA cavernous segment stenosis due to bulky calcified plaque. - up to Moderate right ICA siphon stenosis. - Severe Right Vertebral Artery origin stenosis. - up to Moderate Left Vertebral Artery stenosis. 3. Partially visible retained food or  debris in the thoracic esophagus, but only mild esophageal dilatation favoring esophageal dysmotility rather than achalasia. Follow-up esophagram would best evaluate further. 4.  Aortic Atherosclerosis (ICD10-I70.0). CTP and Vascular findings were communicated to Dr. Curly Shores at 10:26 am on 12/27/2022 by text page via the [facility] messaging system. Electronically Signed   By: Genevie Ann M.D.   On: 12/27/2022 10:29   CT HEAD CODE STROKE WO CONTRAST  Result Date: 12/27/2022 CLINICAL DATA:  Code stroke.  77 year old male EXAM: CT HEAD WITHOUT CONTRAST TECHNIQUE: Contiguous axial images were obtained from the base of the skull through the vertex without intravenous contrast. RADIATION DOSE REDUCTION: This exam was performed according to the departmental dose-optimization program which includes automated exposure control, adjustment of the mA and/or kV according to patient size and/or use of iterative reconstruction technique. COMPARISON:  Head CT 08/19/2022. FINDINGS: Brain: Stable cerebral volume. No midline shift, ventriculomegaly, mass effect, evidence of mass lesion, intracranial hemorrhage or evidence of cortically based acute infarction. Gray-white matter differentiation appears stable since last year and largely normal for age throughout the brain. Vascular: Calcified atherosclerosis at the skull base. No suspicious intracranial vascular hyperdensity. Skull: No acute osseous abnormality identified. Sinuses/Orbits: Chronic paranasal sinus disease and prior sinus surgery. Sinus aeration not significantly changed from August. Tympanic cavities and mastoids remain clear. Other: No acute orbit or scalp soft tissue finding. ASPECTS [facility] Stroke Program Early CT Score) Total score (0-10 with 10 being normal): 10 IMPRESSION: 1. Stable and normal for age noncontrast CT appearance of the brain. 2. ASPECTS 10. Electronically Signed   By: Genevie Ann M.D.   On: 12/27/2022 09:50  PHYSICAL EXAM  Physical Exam  Constitutional:  Appears well-developed and well-nourished, in no acute distress Psych: Affect appropriate to situation, calm and cooperative with exam Eyes: No scleral injection HENT: No OP obstrucion MSK: no joint deformities.  Cardiovascular: Normal rate and regular rhythm.  Respiratory: Effort normal, non-labored breathing GI: Soft.  No distension. There is no tenderness.  Skin: WDI  Neuro: Mental Status: Patient is awake, alert, oriented to person, place, month, year, and situation. Patient is able to give a clear and coherent history. Speech is minimally dysarthric. No signs of aphasia or neglect Cranial Nerves: II: Visual Fields are full. Pupils are equal, round, and reactive to light.  III,IV, VI: EOMI without ptosis or diploplia.  V: Facial sensation is symmetric to temperature VII: Facial movement is symmetric resting and smiling VIII: Hearing is intact to voice X: Palate elevates symmetrically XI: Shoulder shrug is symmetric. XII: Tongue protrudes midline  Motor: Tone is normal. Bulk is normal.  Able to elevate all extremities antigravity without vertical drift.  Right wrist drop present with no movement of the right hand. Sensory: Sensation is symmetric to light touch and temperature in the arms and legs. No extinction to DSS present.  Cerebellar: FNF and HKS are intact bilaterally  ASSESSMENT/PLAN [name] is a 77 y.o. 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 LKW 20:30 on 12/26/22. On presentation, patient was found to have some right arm weakness and was febrile to 101.66F.   Stroke: Scattered acute infarcts involving the high left frontal and parietal lobes likely  embolic from cerebral hypoperfusion due to hypotension from sepsis with tandem stenosis of the proximal left ICA and left cavernous carotid artery in the setting of sepsis.  Code Stroke CT head stable and normal for age noncontrast CT appearance of the brain. ASPECTS 10.  CTA head & neck negative for LVO.  Positive for advanced atherosclerosis of the neck and head with complex plaque at the left ICA origin and bulb with estimated 60 to 65% stenosis, up to severe left ICA cavernous segment stenosis due to bulky calcified plaque, up to moderate right ICA siphon stenosis, severe right vertebral artery origin stenosis, up to moderate left vertebral artery stenosis. CT perfusion negative MRI  scattered foci of restricted diffusion involving the high left frontal and hight left parietal lobe compatible with acute/subacute nonhemorrhagic infarctions. More remote cortical infarcts involving the posteromedial left parietal lobe and remote lacunar infarcts of the thalami bilaterally.  Confluent periventricular T2 hyperintensities bilaterally likely reflect the sequela of chronic microvascular ischemia. 2D Echo LVEF 60 to 65% LDL NOT CALCULATED HgbA1c 6.8 VTE prophylaxis - Lovenox    Diet   Diet NPO time specified   No antithrombotic prior to admission, now on aspirin 81 mg daily and clopidogrel 75 mg daily. X 3 months and then aspirin alone Discuss with IR for possible discharge diagnostic angio with possible elective stenting Therapy recommendations:  pending  Disposition:  pending   Hypertension Home meds:  None Stable Permissive hypertension (OK if < 220/120) but gradually normalize in 5-7 days Long-term BP goal normotensive  Hyperlipidemia Home meds:  atorvastatin 40 mg PO daily, resumed in hospital LDL NOT CALCULATED, goal < 70 Continue  statin at discharge  Diabetes type II Controlled Home meds:  Metformin HgbA1c 6.8, goal < 7.0 CBGs Recent Labs    12/27/22 0936  GLUCAP 168*     SSI  Other Stroke Risk Factors Advanced Age >/= 44  Former Cigarette smoker, stopped smoking 17 years ago ETOH use, alcohol level <10, advised to drink no more than 2 drink(s) a day  Other Active Problems RA on immunosuppression with Orencia, methotrexate, and prednisone  Sepsis with acute encephalopathy Influenza A+ in an immunosuppressed patient  Febrile with Tmax 102.8 Lactic acidosis, resolved 2.9 > 2.1> 5.5 > 2.8 > 1.1 > 1.9  Hospital day # 1  Anibal Henderson, AGACNP-BC [facility] Pager: [phone]   STROKE MD NOTE :  I have personally obtained history,examined this patient, reviewed notes, independently viewed imaging studies, participated in medical decision making and plan of care.ROS completed by me personally and pertinent positives fully documented  I have made any additions or clarifications directly to the above note. Agree with note above.  Patient presented with right hand weakness in the setting of sepsis from influenza and pneumonia and MRI shows embolic left frontoparietal MCA branch infarct with CT angiogram showing moderate proximal left carotid as well as severe left cavernous carotid stenosis and his strokes may be related to hypotension from sepsis superimposed upon his moderate to severe Intracranial left carotid stenosis.  Recommend avoid hypotension keep systolic 224 and above.  Continue treatment of sepsis as per primary care team.  Dual antiplatelet therapy aspirin and Plavix for 3 months followed by aspirin alone    continue ongoing stroke workup and aggressive risk factor modification.  Greater than 50% time during this 50-minute visit was spent on counseling and coordination of care about his embolic stroke and carotid stenosis and A-fib and answering questions.      Antony Contras, MD Medical Director [facility] Pager: [phone] 12/28/2022 3:53 PM  To contact Stroke Continuity provider, please refer to [email or web address] After  hours, contact General Neurology

## 2022-12-28 NOTE — ED Notes (Signed)
Pt called exwife on personal cell phone in room at bedside. Spoke with pt and ex wife.

## 2022-12-28 NOTE — ED Notes (Signed)
PT at bedside. Pt ambulating with PT. Pt bed linens changed and condom cath emptied

## 2022-12-29 ENCOUNTER — Inpatient Hospital Stay (HOSPITAL_COMMUNITY): Payer: Medicare Other

## 2022-12-29 DIAGNOSIS — I63132 Cerebral infarction due to embolism of left carotid artery: Secondary | ICD-10-CM | POA: Diagnosis not present

## 2022-12-29 DIAGNOSIS — I639 Cerebral infarction, unspecified: Secondary | ICD-10-CM | POA: Diagnosis not present

## 2022-12-29 DIAGNOSIS — J101 Influenza due to other identified influenza virus with other respiratory manifestations: Secondary | ICD-10-CM | POA: Diagnosis not present

## 2022-12-29 DIAGNOSIS — A419 Sepsis, unspecified organism: Secondary | ICD-10-CM | POA: Diagnosis not present

## 2022-12-29 DIAGNOSIS — G9340 Encephalopathy, unspecified: Secondary | ICD-10-CM | POA: Diagnosis not present

## 2022-12-29 LAB — BASIC METABOLIC PANEL
Anion gap: 13 (ref 5–15)
BUN: 19 mg/dL (ref 8–23)
CO2: 24 mmol/L (ref 22–32)
Calcium: 7.9 mg/dL — ABNORMAL LOW (ref 8.9–10.3)
Chloride: 103 mmol/L (ref 98–111)
Creatinine, Ser: 0.88 mg/dL (ref 0.61–1.24)
GFR, Estimated: >60 mL/min (ref >60)
Glucose, Bld: 118 mg/dL — ABNORMAL HIGH (ref 70–99)
Potassium: 2.8 mmol/L — ABNORMAL LOW (ref 3.5–5.1)
Sodium: 140 mmol/L (ref 135–145)

## 2022-12-29 LAB — POTASSIUM: Potassium: 2.7 mmol/L — CL (ref 3.5–5.1)

## 2022-12-29 LAB — CBC
HCT: 25.7 % — ABNORMAL LOW (ref 39.0–52.0)
Hemoglobin: 8.6 g/dL — ABNORMAL LOW (ref 13.0–17.0)
MCH: 29.1 pg (ref 26.0–34.0)
MCHC: 33.5 g/dL (ref 30.0–36.0)
MCV: 86.8 fL (ref 80.0–100.0)
Platelets: 153 10*3/uL (ref 150–400)
RBC: 2.96 MIL/uL — ABNORMAL LOW (ref 4.22–5.81)
RDW: 18 % — ABNORMAL HIGH (ref 11.5–15.5)
WBC: 2.1 10*3/uL — ABNORMAL LOW (ref 4.0–10.5)
nRBC: 0 % (ref 0.0–0.2)

## 2022-12-29 LAB — PHOSPHORUS
Phosphorus: 4.2 mg/dL (ref 2.5–4.6)
Phosphorus: 1.6 mg/dL — ABNORMAL LOW (ref 2.5–4.6)

## 2022-12-29 LAB — GLUCOSE, CAPILLARY
Glucose-Capillary: 122 mg/dL — ABNORMAL HIGH (ref 70–99)
Glucose-Capillary: 83 mg/dL (ref 70–99)
Glucose-Capillary: 77 mg/dL (ref 70–99)
Glucose-Capillary: 87 mg/dL (ref 70–99)
Glucose-Capillary: 119 mg/dL — ABNORMAL HIGH (ref 70–99)

## 2022-12-29 LAB — MAGNESIUM
Magnesium: 1.4 mg/dL — ABNORMAL LOW (ref 1.7–2.4)
Magnesium: 1.4 mg/dL — ABNORMAL LOW (ref 1.7–2.4)

## 2022-12-29 MED ORDER — PREDNISONE 1 MG PO TABS: 1.0000 mg | ORAL_TABLET | Freq: Every day | ORAL | Status: DC

## 2022-12-29 MED ORDER — POTASSIUM CHLORIDE 20 MEQ PO PACK
40.0000 meq | PACK | Freq: Two times a day (BID) | ORAL | Status: AC
  Administered 2022-12-29 (×2): 40 meq
  Filled 2022-12-29 (×2): qty 2

## 2022-12-29 MED ORDER — PROSOURCE TF20 ENFIT COMPATIBL EN LIQD
60.0000 mL | Freq: Every day | ENTERAL | Status: DC
  Administered 2022-12-29 – 2022-12-30 (×2): 60 mL
  Filled 2022-12-29 (×2): qty 60

## 2022-12-29 MED ORDER — POTASSIUM CHLORIDE 10 MEQ/100ML IV SOLN
10.0000 meq | INTRAVENOUS | Status: AC
  Administered 2022-12-29 – 2022-12-30 (×6): 10 meq via INTRAVENOUS
  Filled 2022-12-29 (×6): qty 100

## 2022-12-29 MED ORDER — OSMOLITE 1.5 CAL PO LIQD
1000.0000 mL | ORAL | Status: DC
  Administered 2022-12-29 (×2): 1000 mL

## 2022-12-29 MED ORDER — PREDNISONE 1 MG PO TABS
1.0000 mg | ORAL_TABLET | Freq: Every day | ORAL | Status: DC
  Administered 2022-12-29 – 2023-01-01 (×4): 1 mg
  Filled 2022-12-29 (×4): qty 1

## 2022-12-29 MED ORDER — POTASSIUM CHLORIDE 10 MEQ/100ML IV SOLN
10.0000 meq | INTRAVENOUS | Status: AC
  Administered 2022-12-29 (×3): 10 meq via INTRAVENOUS
  Filled 2022-12-29 (×3): qty 100

## 2022-12-29 MED ORDER — MAGNESIUM SULFATE 4 GM/100ML IV SOLN
4.0000 g | Freq: Once | INTRAVENOUS | Status: AC
  Administered 2022-12-29: 4 g via INTRAVENOUS
  Filled 2022-12-29: qty 100

## 2022-12-29 MED ORDER — FREE WATER
200.0000 mL | Status: DC
  Administered 2022-12-29 – 2022-12-30 (×6): 200 mL

## 2022-12-29 NOTE — Progress Notes (Addendum)
Subjective: No acute events overnight.  Patient reports that he is feeling "so so" today. Denies any new pain at this time. He still feels a bit "fuzzy." He reports no improvement in the movement of his right hand. He also endorses some coughing. He denies SOB, palpitations, muscle cramping.   Objective:  Vital signs in last 24 hours: Vitals:   12/28/22 2308 12/29/22 0342 12/29/22 0835 12/29/22 1114  BP: (!) 141/60 119/62 (!) 144/55 136/66  Pulse: 69 66 64 69  Resp: '17 20 16 15  '$ Temp: 98.4 F (36.9 C) 98.3 F (36.8 C) 98.7 F (37.1 C) 98 F (36.7 C)  TempSrc: Oral Oral Oral Oral  SpO2: 99% 98% 100% 98%  Weight:      Height:       Weight change: -2.4 kg  Intake/Output Summary (Last 24 hours) at 12/29/2022 1322 Last data filed at 12/29/2022 0815 Gross per 24 hour  Intake 250 ml  Output 925 ml  Net -675 ml   Physical Exam General: Alert, in no acute distress CV: RRR, no murmurs, rubs, or gallops Pulm: Normal respiratory effort. Coarse crackles bilaterally.  Skin: Warm and dry Ext: No LE edema Neuro: A&O x4. 0/5 R grip and 5/5 L grip, sensation intact.  Psych: Normal mood and affect  Assessment/Plan:  Principal Problem:   Acute encephalopathy Active Problems:   Sepsis without acute organ dysfunction (HCC)   Influenza A   Cerebrovascular accident (CVA) ([place])  [name] is a 77 year old man with medical history significant for PMH of RA, ILD, Hx of DVT/PE, T2DM, HTN, HLD, Esophageal outlet obstructions s/p multiple dilatations and GJ tube placement for FTT, severe protein calorie malnutrition, AAA L [place] artery aneurysm s/p EVAR admitted for acute encephalopathy and CVA.   Sepsis with Acute Encephalopathy Recent hx of pneumonia Influenza A Patient has remained afebrile and stable BP to 140s/60s with no episodes of hypotension. Patient alert and oriented on exam today. MRSA swab was negative, will d/c vancomycin today given negative MRSA swab. Will continue  cefepime for today and consider discontinuing tomorrow if patient continues to improve clinically. Blood cultures still pending, no growth '@2'$  days. -Discontinue vancomycin -Continue cefepime, consider d/c tomorrow -Continue Tamiflu x5 days total (day 3 today)  Left MCA CVA  Patient continues to have limited mobility of the right hand. Carotid U/S showed 40-59% stenosis in left ICA, 1-39% stenosis in right ICA. Neuro recommended IR evaluation for possible diagnostic angio/elective stenting. IR consulted and will see if they can add the patient to the schedule tomorrow for diagnostic angio.  -Continue ASA '81mg'$  -Continue clopidogrel '75mg'$  -Atorvastatin 40 mg daily  -PT/OT/SLP -Neuro checks -Telemetry  Malnutrition Hypokalemia Hypomagnesemia K 2.8 today, Mag 1.4 likely in the setting of chronic malnutrition. RD consulted and provided dietary recommendations.  -Kcl 40 mEq x2 by PEG -IV Kcl 56mq q1h x3  -IV mag sulfate 4g -Osmolite 1000 mL (60 mL/hr) per tube -Prosouce 60 mL daily per tube -Free water flushes 200 mL q4 hours per tube  T2DM  A1c 6.2%. PTA on metformin, glucose 110s-120s overnight. Patient starting on tube feeds today, will monitor glucose.  -Trend CBGs  Chronic Steroids On chronic prednisone for many years. '2mg'$  prednisone taper started in mid-November to '1mg'$  starting beginning of December x4 weeks. Steroid taper should have been completed upon admission. Will continue prednisone '1mg'$  daily.  -Prednisone '1mg'$  daily, per tube  Esophageal Outlet Obstruction with PEG tube   Plan for patient to follow  up with outpatient GI for further workup if needed.    Dispo OT recommends outpatient OT PRN w/o equipment. PT recommends HH PT w/ rolling walker. SLP recommends NPO and outpatient SLP.   Diet: Tube feeds Bowel: none VTE: SCDs  IVF: None Code: Full  Prior to Admission Living Arrangement: at home w/ ex-wife  Anticipated Discharge Location: TBD Barriers to Discharge:  continued management Dispo: Anticipated discharge in approximately less than 2 day(s).     LOS: 2 days   Spero Curb, Medical Student 12/29/2022, 1:22 PM

## 2022-12-29 NOTE — Plan of Care (Signed)
  Problem: Education: Goal: Knowledge of disease or condition will improve Outcome: Not Progressing Goal: Knowledge of secondary prevention will improve (MUST DOCUMENT ALL) Outcome: Not Progressing Goal: Knowledge of patient specific risk factors will improve Elta [place] N/A or DELETE if not current risk factor) Outcome: Not Progressing   Problem: Ischemic Stroke/TIA Tissue Perfusion: Goal: Complications of ischemic stroke/TIA will be minimized Outcome: Not Progressing   Problem: Coping: Goal: Will verbalize positive feelings about self Outcome: Not Progressing Goal: Will identify appropriate support needs Outcome: Not Progressing   Problem: Health Behavior/Discharge Planning: Goal: Ability to manage health-related needs will improve Outcome: Not Progressing Goal: Goals will be collaboratively established with patient/family Outcome: Not Progressing   Problem: Self-Care: Goal: Ability to participate in self-care as condition permits will improve Outcome: Not Progressing Goal: Verbalization of feelings and concerns over difficulty with self-care will improve Outcome: Not Progressing Goal: Ability to communicate needs accurately will improve Outcome: Not Progressing   Problem: Nutrition: Goal: Risk of aspiration will decrease Outcome: Not Progressing Goal: Dietary intake will improve Outcome: Not Progressing   Problem: Education: Goal: Knowledge of General Education information will improve Description: Including pain rating scale, medication(s)/side effects and non-pharmacologic comfort measures Outcome: Not Progressing   Problem: Health Behavior/Discharge Planning: Goal: Ability to manage health-related needs will improve Outcome: Not Progressing   Problem: Clinical Measurements: Goal: Ability to maintain clinical measurements within normal limits will improve Outcome: Not Progressing Goal: Will remain free from infection Outcome: Not Progressing Goal: Diagnostic test  results will improve Outcome: Not Progressing Goal: Respiratory complications will improve Outcome: Not Progressing Goal: Cardiovascular complication will be avoided Outcome: Not Progressing   Problem: Activity: Goal: Risk for activity intolerance will decrease Outcome: Not Progressing   Problem: Nutrition: Goal: Adequate nutrition will be maintained Outcome: Not Progressing   Problem: Coping: Goal: Level of anxiety will decrease Outcome: Not Progressing   Problem: Elimination: Goal: Will not experience complications related to bowel motility Outcome: Not Progressing Goal: Will not experience complications related to urinary retention Outcome: Not Progressing   Problem: Pain Managment: Goal: General experience of comfort will improve Outcome: Not Progressing   Problem: Safety: Goal: Ability to remain free from injury will improve Outcome: Not Progressing   Problem: Skin Integrity: Goal: Risk for impaired skin integrity will decrease Outcome: Not Progressing

## 2022-12-29 NOTE — TOC Initial Note (Signed)
Transition of Care [facility]) - Initial/Assessment Note    Patient Details  Name: [name] MRN: [id number] Date of Birth: November 30, 1946  Transition of Care [facility]) CM/SW Contact:    Pollie Friar, RN Phone Number: 12/29/2022, 3:15 PM  Clinical Narrative:                 Pt states he normally lives alone but has been staying with his ex-wife. He is not sure if she will let him return after the hospital. She was assisting him with his home medications. Pt drives self but states his ex-spouse can provide needed transportation.  CM asked if we could call his ex-spouse and he gave permission but he doesn't know the number and she is not in his contacts. He did give permission to call his so listed. CM has called his son and left a voicemail.  ToC following.  Expected Discharge Plan: [place] Barriers to Discharge: Continued Medical Work up   Patient Goals and CMS Choice   CMS Medicare.gov Compare Post Acute Care list provided to:: Patient Choice offered to / list presented to : Patient      Expected Discharge Plan and Services   Discharge Planning Services: CM Consult Post Acute Care Choice: [place] arrangements for the past 2 months: Apartment                                      Prior Living Arrangements/Services Living arrangements for the past 2 months: Apartment Lives with:: Self Patient language and need for interpreter reviewed:: Yes Do you feel safe going back to the place where you live?: Yes          Current home services: DME (cane and shower seat) Criminal Activity/Legal Involvement Pertinent to Current Situation/Hospitalization: No - Comment as needed  Activities of Daily Living Home Assistive Devices/Equipment: None ADL Screening (condition at time of admission) Patient's cognitive ability adequate to safely complete daily activities?: Yes Is the patient deaf or have difficulty hearing?: No Does the patient have  difficulty seeing, even when wearing glasses/contacts?: No Does the patient have difficulty concentrating, remembering, or making decisions?: No Patient able to express need for assistance with ADLs?: Yes Does the patient have difficulty dressing or bathing?: Yes Independently performs ADLs?: Yes (appropriate for developmental age) Does the patient have difficulty walking or climbing stairs?: No Weakness of Legs: Both Weakness of Arms/Hands: Both  Permission Sought/Granted                  Emotional Assessment Appearance:: Appears stated age Attitude/Demeanor/Rapport: Engaged Affect (typically observed): Accepting Orientation: : Oriented to Self, Oriented to Place, Oriented to  Time, Oriented to Situation   Psych Involvement: No (comment)  Admission diagnosis:  Weakness [R53.1] Disorientation [R41.0] Influenza A [J10.1] Acute encephalopathy [G93.40] Sepsis without acute organ dysfunction, due to unspecified organism [facility]) [A41.9] Patient Active Problem List   Diagnosis Date Noted   Sepsis without acute organ dysfunction ([place]) 12/28/2022   Influenza A 12/28/2022   Cerebrovascular accident (CVA) ([place]) 12/28/2022   Acute encephalopathy 12/27/2022   Acute esophagitis    Abdominal pain, epigastric    Closed left fibular fracture 08/22/2022   Reflux esophagitis 08/22/2022   Pressure injury of skin 08/20/2022   UGIB (upper gastrointestinal bleed) 08/18/2022   Pancytopenia ([place])    Immunosuppression ([place])    ILD (interstitial lung disease) ([place])  Diarrhea    DIC (disseminated intravascular coagulation) ([place])    AKI (acute kidney injury) ([place])    COVID-19 01/26/2021   Rheumatoid arthritis ([place]) 07/22/2015   Mucositis due to chemotherapy 06/25/2015   Mucositis due to radiation therapy 06/25/2015   SBO (small bowel obstruction) ([name]) 05/29/2015   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   H/O drug therapy 05/12/2015   Abnormal mental state 04/22/2015   Fast heart beat 04/22/2015    Febrile 04/18/2015   Candida esophagitis ([place]) 04/16/2015   Can't get food down 04/16/2015   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   Fall 05/24/2014   Traumatic pneumothorax 05/23/2014   Diabetes mellitus, type 2 ([place]) 05/01/2014   Perirectal abscess 07/31/2013   Hemorrhoids 07/31/2013   Lentigines 11/13/2012   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   Atypical nevus 08/29/2012   Malignant melanoma in situ ([place]) 08/29/2012   Polypharmacy 08/23/2012   Dyspnea 08/14/2012   Rheumatoid arthritis(714.0) 08/14/2012   HTN (hypertension) 08/14/2012   GERD (gastroesophageal reflux disease) 08/14/2012   Emphysema 08/14/2012   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   Duodenal ulcer with hemorrhage 10/15/2011   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   Esophageal stricture 10/15/2011   PCP:  Elaina Pattee, MD Pharmacy:   [facility], [place] - [address] [place] [address] Phone: [phone] Fax: [phone]  [facility] [place], [place] - [address] AT [facility] [address] [place] [address] Phone: [phone] Fax: [phone]  [facility] [place], [place] - [address] AT [place] [address] [place] [address] Phone: [phone] Fax: [phone]  [facility] [id number] - [place], [place] - [address] [place] [address] Phone: [phone] Fax: [phone]     Social Determinants of Health (SDOH) Social History: SDOH Screenings   Food Insecurity: No Food Insecurity (12/28/2022)  Housing: Low Risk  (12/28/2022)  Transportation Needs: No Transportation Needs (12/28/2022)  Utilities: Not At Risk (12/28/2022)  Tobacco Use: Medium Risk (12/27/2022)   SDOH Interventions:     Readmission Risk Interventions     No data to display

## 2022-12-29 NOTE — Progress Notes (Signed)
Physical Therapy Treatment Patient Details Name: [name] MRN: [id number] DOB: 1946-08-28 Today's Date: 12/29/2022   History of Present Illness [name] is a 77 y.o. male who presents after being found on floor unable to talk and get up.  MRI positive for scattered foci of restricted diffusion involving the high left frontal lobe and high left parietal lobe compatible with acute/subacute nonhemorrhagic infarcts.  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  He also tested positive for the flu A and possible aspiration PNA.  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 Pt performed bed mobility with supervision, then reported urge to have BM. Stood without AD and min guard and ambulated in/out of bathroom holding onto IV pole with min guard - pt continent of bowel and required total A for peri-care in standing. Took seated rest break, then ambulated into hallway with min guard holding onto IV pole - no true LOB noted but demonstrated generalized unsteadiness. Returned to bed due to fatigue and reviewed exercises to work on for R wrist drop. Pt reported his ex-wife will be available 24/7 to provide assist at home. Recommend HHPT due to current strength, endurance, and balance deficits. Acute PT to cont to follow.     Recommendations for follow up therapy are one component of a multi-disciplinary discharge planning process, led by the attending physician.  Recommendations may be updated based on patient status, additional functional criteria and insurance authorization.  Follow Up Recommendations  Home health PT     Assistance Recommended at Discharge Intermittent Supervision/Assistance  Patient  can return home with the following A little help with walking and/or transfers;A little help with bathing/dressing/bathroom;Assistance with cooking/housework;Assist for transportation;Help with stairs or ramp for entrance   Equipment Recommendations  Rolling walker (2 wheels)    Recommendations for Other Services       Precautions / Restrictions Precautions Precautions: Fall Precaution Comments: peg tube, flu + Restrictions Weight Bearing Restrictions: No     Mobility  Bed Mobility Overal bed mobility: Needs Assistance Bed Mobility: Rolling, Sit to Supine, Supine to Sit Rolling: Supervision   Supine to sit: Supervision Sit to supine: Supervision   General bed mobility comments: increased time and effort and use of bedrails Patient Response: Cooperative  Transfers Overall transfer level: Needs assistance Equipment used: None Transfers: Sit to/from Stand Sit to Stand: Min guard           General transfer comment: stood from EOB and from toilet with bedside commode over top with min guard. Held onto IV pole for stability in stance    Ambulation/Gait Ambulation/Gait assistance: Min guard Gait Distance (Feet): 110 Feet (110 + 10 + 10 going in/out of bathroom) Assistive device: IV Pole Gait Pattern/deviations: Step-through pattern, Decreased stride length, Decreased weight shift to right Gait velocity: reduced Gait velocity interpretation: <1.31 ft/sec, indicative of household ambulator   General Gait Details: generalized unsteadiness and deconsitioning noted with gait but no true LOB   Stairs             Wheelchair Mobility    Modified Rankin (Stroke Patients Only)       Balance Overall balance assessment: Needs assistance Sitting-balance support: Feet supported Sitting balance-Leahy Scale: [place] Sitting  balance - Comments: able to maintain static sitting balance with distant supervision   Standing balance support: Single extremity supported (IV  pole) Standing balance-Leahy Scale: Fair Standing balance comment: able to maintain static standing balance with close supervision but required min guard for dynamic standing balance                            Cognition Arousal/Alertness: Awake/alert Behavior During Therapy: WFL for tasks assessed/performed Overall Cognitive Status: Within Functional Limits for tasks assessed                                 General Comments: noted occasional difficulty problem solving. Reported urge to have BM and upon reaching toilet stated "now what"        Exercises      General Comments General comments (skin integrity, edema, etc.): Pt reported feeling "weak" and fatigued after ambulating. Reviewed exercises for pt to work on for R wrist drop      Pertinent Vitals/Pain Pain Assessment Pain Assessment: No/denies pain    Home Living                          Prior Function            PT Goals (current goals can now be found in the care plan section) Acute Rehab PT Goals Patient Stated Goal: to get home and stronger PT Goal Formulation: With patient Time For Goal Achievement: 01/11/23 Potential to Achieve Goals: Good Progress towards PT goals: Progressing toward goals    Frequency    Min 4X/week      PT Plan Current plan remains appropriate    Co-evaluation              AM-PAC PT "6 Clicks" Mobility   Outcome Measure  Help needed turning from your back to your side while in a flat bed without using bedrails?: A Little Help needed moving from lying on your back to sitting on the side of a flat bed without using bedrails?: A Little Help needed moving to and from a bed to a chair (including a wheelchair)?: A Little Help needed standing up from a chair using your arms (e.g., wheelchair or bedside chair)?: A Little Help needed to walk in hospital room?: A Little Help needed climbing 3-5 steps with a railing? : A Lot 6 Click Score:  17    End of Session   Activity Tolerance: Patient tolerated treatment well;Patient limited by fatigue Patient left: in bed;with call bell/phone within reach;with bed alarm set Nurse Communication: Mobility status PT Visit Diagnosis: Unsteadiness on feet (R26.81);Muscle weakness (generalized) (M62.81);Difficulty in walking, not elsewhere classified (R26.2)     Time: 1962-2297 PT Time Calculation (min) (ACUTE ONLY): 24 min  Charges:  $Gait Training: 8-22 mins $Therapeutic Activity: 8-22 mins                     Becky Sax PT, DPT  Blenda Nicely 12/29/2022, 12:02 PM

## 2022-12-29 NOTE — Progress Notes (Signed)
Mobility Specialist: Progress Note   12/29/22 1401  Mobility  Activity Ambulated with assistance in hallway  Level of Assistance Contact guard assist, steadying assist  Assistive Device None  Distance Ambulated (ft) 100 ft  Activity Response Tolerated fair  Mobility Referral Yes  $Mobility charge 1 Mobility   Pre-Mobility: 80 HR Post-Mobility: 79 HR, 149/68 (91) BP, 98% SpO2  Pt received in the bed and agreeable to mobility. Requesting to use BR, void successful, then hallway ambulation. Distance limited secondary to c/o dizziness throughout, VSS. Pt back to bed after session with call bell and phone at his side. Bed alarm is on.   [name] Mobility Specialist Please contact via SecureChat or Rehab office at [phone]

## 2022-12-29 NOTE — Progress Notes (Signed)
STROKE TEAM PROGRESS NOTE   INTERVAL HISTORY No family is at the bedside.  Patient is lying in bed comfortably.  Still has significant right hand weakness but is able to extend his right wrist but cannot bend his fingers.  Echocardiogram showed normal ejection fraction.  Carotid ultrasound is pending.  Speech therapy recommended n.p.o. may need a feeding tube Vitals:   12/28/22 2308 12/29/22 0342 12/29/22 0835 12/29/22 1114  BP: (!) 141/60 119/62 (!) 144/55 136/66  Pulse: 69 66 64 69  Resp: '17 20 16 15  '$ Temp: 98.4 F (36.9 C) 98.3 F (36.8 C) 98.7 F (37.1 C) 98 F (36.7 C)  TempSrc: Oral Oral Oral Oral  SpO2: 99% 98% 100% 98%  Weight:      Height:       CBC:  Recent Labs  Lab 12/27/22 0946 12/28/22 0301 12/29/22 0325  WBC 8.2 3.8* 2.1*  NEUTROABS 6.6  --   --   HGB 10.3* 9.6* 8.6*  HCT 31.6* 30.6* 25.7*  MCV 88.8 90.5 86.8  PLT 187 168 062   Basic Metabolic Panel:  Recent Labs  Lab 12/28/22 0301 12/29/22 0325  NA 136 140  K 3.5 2.8*  CL 103 103  CO2 22 24  GLUCOSE 136* 118*  BUN 18 19  CREATININE 0.83 0.88  CALCIUM 8.3* 7.9*  MG  --  1.4*   Lipid Panel:  Recent Labs  Lab 12/28/22 0301  CHOL 77  TRIG 51  HDL 29*  CHOLHDL 2.7  VLDL 10  LDLCALC NOT CALCULATED   HgbA1c:  Recent Labs  Lab 12/27/22 2028  HGBA1C 6.2*   Urine Drug Screen: No results for input(s): "LABOPIA", "COCAINSCRNUR", "LABBENZ", "AMPHETMU", "THCU", "LABBARB" in the last 168 hours.  Alcohol Level  Recent Labs  Lab 12/27/22 0946  ETH <10    IMAGING past 24 hours No results found.  PHYSICAL EXAM  Physical Exam  Constitutional: Appears well-developed and well-nourished, in no acute distress Psych: Affect appropriate to situation, calm and cooperative with exam Eyes: No scleral injection HENT: No OP obstrucion MSK: no joint deformities.  Cardiovascular: Normal rate and regular rhythm.  Respiratory: Effort normal, non-labored breathing GI: Soft.  No distension. There is no  tenderness.  Skin: WDI  Neuro: Mental Status: Patient is awake, alert, oriented to person, place, month, year, and situation. Patient is able to give a clear and coherent history. Speech is minimally dysarthric. No signs of aphasia or neglect Cranial Nerves: II: Visual Fields are full. Pupils are equal, round, and reactive to light.  III,IV, VI: EOMI without ptosis or diploplia.  V: Facial sensation is symmetric to temperature VII: Facial movement is symmetric resting and smiling VIII: Hearing is intact to voice X: Palate elevates symmetrically XI: Shoulder shrug is symmetric. XII: Tongue protrudes midline  Motor: Tone is normal. Bulk is normal.  Able to elevate all extremities antigravity without vertical drift.  Right wrist drop present with no movement of the right hand. Sensory: Sensation is symmetric to light touch and temperature in the arms and legs. No extinction to DSS present.  Cerebellar: FNF and HKS are intact bilaterally  ASSESSMENT/PLAN [name] is a 77 y.o. 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 LKW 20:30 on 12/26/22. On presentation, patient was found to have some right arm weakness and was febrile to 101.58F.   Stroke: Scattered acute infarcts involving the high left frontal and parietal lobes likely embolic from cerebral hypoperfusion due to hypotension from sepsis with tandem stenosis of the proximal left ICA and left cavernous carotid artery in the setting of sepsis.  Code Stroke CT head stable and normal for age noncontrast CT appearance of the brain. ASPECTS 10.  CTA head & neck negative for LVO.  Positive for advanced atherosclerosis of the neck  and head with complex plaque at the left ICA origin and bulb with estimated 60 to 65% stenosis, up to severe left ICA cavernous segment stenosis due to bulky calcified plaque, up to moderate right ICA siphon stenosis, severe right vertebral artery origin stenosis, up to moderate left vertebral artery stenosis. CT perfusion negative MRI  scattered foci of restricted diffusion involving the high left frontal and hight left parietal lobe compatible with acute/subacute nonhemorrhagic infarctions. More remote cortical infarcts involving the posteromedial left parietal lobe and remote lacunar infarcts of the thalami bilaterally.  Confluent periventricular T2 hyperintensities bilaterally likely reflect the sequela of chronic microvascular ischemia. 2D Echo LVEF 60 to 65% LDL NOT CALCULATED HgbA1c 6.8 VTE prophylaxis - Lovenox    Diet   Diet NPO time specified   No antithrombotic prior to admission, now on aspirin 81 mg daily and clopidogrel 75 mg daily. X 3 months and then aspirin alone Discuss with IR for possible discharge diagnostic angio with possible elective stenting Therapy recommendations:  pending  Disposition:  pending   Hypertension Home meds:  None Stable Permissive hypertension (OK if < 220/120) but gradually normalize in 5-7 days Long-term BP goal normotensive  Hyperlipidemia Home meds:  atorvastatin 40 mg PO daily, resumed in hospital LDL NOT CALCULATED, goal < 70 Continue statin at discharge  Diabetes type II Controlled Home meds:  Metformin HgbA1c 6.8, goal < 7.0 CBGs Recent Labs    12/29/22 0401 12/29/22 0838 12/29/22 1116  GLUCAP 122* 83 77    SSI  Other Stroke Risk Factors Advanced Age >/= 30  Former Cigarette smoker, stopped smoking 17 years ago ETOH use, alcohol level <10, advised to drink no more than 2 drink(s) a day  Other Active Problems RA on immunosuppression with Orencia, methotrexate, and prednisone  Sepsis with acute encephalopathy Influenza A+  in an immunosuppressed patient  Febrile with Tmax 102.8 Lactic acidosis, resolved 2.9 > 2.1> 5.5 > 2.8 > 1.1 > 1.9  Hospital day # 2  Patient presented with right hand weakness in the setting of sepsis from influenza and pneumonia and MRI shows embolic left frontoparietal MCA branch infarct with CT angiogram showing moderate proximal left carotid as well as severe left cavernous carotid stenosis and his strokes may be related to hypotension from sepsis superimposed upon his moderate to severe Intracranial left carotid stenosis.  Recommend avoid hypotension keep systolic 440 and above.  Continue treatment of sepsis as per primary care team.  Dual antiplatelet therapy aspirin and Plavix for 3 months followed by aspirin alone    continue aggressive risk factor modification.   Check carotid ultrasound study.  Speech therapy for swallow eval and if unable may need core track tube feeding.  Greater than 50% time during this 35-minute visit was spent in counseling and coordination of care about his embolic stroke discussion about evaluation and treatment and answering questions. Antony Contras, MD Medical Director [facility] Pager: [phone] 12/29/2022 11:32 AM  To contact Stroke Continuity provider, please refer to [email or web address] After hours, contact General Neurology

## 2022-12-29 NOTE — Progress Notes (Signed)
Occupational Therapy Treatment Patient Details Name: [name] MRN: [id number] DOB: 1946/06/07 Today's Date: 12/29/2022   History of present illness [name] is a 77 y.o. male who presents after being found on floor unable to talk and get up.  MRI positive for scattered foci of restricted diffusion involving the high left frontal lobe and high left parietal lobe compatible with acute/subacute nonhemorrhagic infarcts.  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  He also tested positive for the flu A and possible aspiration PNA.  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 Pt continues to present with RUE hemiparesis . Session focused on RUE AAROM/AROM therex. Pt completed below therex, pt presents with tenodesis grasp able to grasp wash cloth and bring to his mouth. Education provided on positioning for RUE in order to keep wrist in neutral position. Pt motivated to improve R hand as this is his dominant extremity, discussed compensatory methods for ADLs as needed. Pt would continue to benefit from skilled occupational therapy while admitted and after d/c to address the below listed limitations in order to improve overall functional mobility and facilitate independence with BADL participation. DC plan remains appropriate, will follow acutely per POC.      Recommendations for follow up therapy are one component of a multi-disciplinary discharge planning process, led by the attending physician.  Recommendations may be updated based on patient status, additional functional criteria and insurance authorization.    Follow Up Recommendations  Outpatient OT     Assistance Recommended at Discharge PRN  Patient can  return home with the following  A little help with bathing/dressing/bathroom;Assistance with cooking/housework;Assist for transportation;Help with stairs or ramp for entrance   Equipment Recommendations  None recommended by OT    Recommendations for Other Services      Precautions / Restrictions Precautions Precautions: Fall Precaution Comments: peg tube, flu + Restrictions Weight Bearing Restrictions: No       Mobility Bed Mobility                    Transfers                         Balance                                           ADL either performed or assessed with clinical judgement   ADL Overall ADL's : Needs assistance/impaired                                       General ADL Comments: session focused on AROM with RUE    Extremity/Trunk Assessment Upper Extremity Assessment Upper Extremity Assessment: RUE deficits/detail RUE Deficits / Details: Proximal strength WFLs for shoulder and elbow.  Decreased wrist extension 2+/5 with only trace digit flexion and extension noted.  History of RA in bilateral hands with ulnar deviation noted.  Wrist sits in flexion if held up.  issued pt wash cloth to work on tenodesis grasp, education provided on keeping wrist  in neutral position at rest RUE Sensation: WNL RUE Coordination: decreased fine motor   Lower Extremity Assessment Lower Extremity Assessment: Defer to PT evaluation        Vision Baseline Vision/History: 1 Wears glasses (readers) Patient Visual Report: No change from baseline     Perception Perception Perception: Within Functional Limits   Praxis Praxis Praxis: Intact    Cognition Arousal/Alertness: Awake/alert Behavior During Therapy: WFL for tasks assessed/performed Overall Cognitive Status: Within Functional Limits for tasks assessed                                          Exercises General Exercises - Upper  Extremity Elbow Flexion: Strengthening, Right (with an emphasis on tenodesis grasp) Elbow Extension: Strengthening, Right (with an emphasis on tenodesis grasp) Wrist Flexion: Right, PROM Wrist Extension: Right, PROM Digit Composite Flexion: PROM, Right Composite Extension: PROM, Right Other Exercises Other Exercises: education provided on placing towel roll in RUE and completing elbow flexion/extension to facilitate tenodesis grasp, emphasis on keeping R wrist in neutral position Other Exercises: education provided on self ROM therex to RUE such as digit flexion/extension, wrist ulnar/radial deviation and wrist flexion/extension    Shoulder Instructions       General Comments Pt reported feeling "weak" and fatigued after ambulating. Reviewed exercises for pt to work on for R wrist drop    Pertinent Vitals/ Pain       Pain Assessment Pain Assessment: No/denies pain  Home Living                                          Prior Functioning/Environment              Frequency  Min 2X/week        Progress Toward Goals  OT Goals(current goals can now be found in the care plan section)  Progress towards OT goals: Progressing toward goals  Acute Rehab OT Goals Patient Stated Goal: to get better OT Goal Formulation: With patient Time For Goal Achievement: 01/11/23 Potential to Achieve Goals: Good  Plan Discharge plan remains appropriate;Frequency remains appropriate    Co-evaluation                 AM-PAC OT "6 Clicks" Daily Activity     Outcome Measure   Help from another person eating meals?: A Little Help from another person taking care of personal grooming?: A Little Help from another person toileting, which includes using toliet, bedpan, or urinal?: A Little Help from another person bathing (including washing, rinsing, drying)?: A Little Help from another person to put on and taking off regular upper body clothing?: A Little Help from  another person to put on and taking off regular lower body clothing?: A Little 6 Click Score: 18    End of Session    OT Visit Diagnosis: Unsteadiness on feet (R26.81);Muscle weakness (generalized) (M62.81);Pain   Activity Tolerance Patient tolerated treatment well   Patient Left in bed;with call bell/phone within reach;with bed alarm set   Nurse Communication Mobility status        Time: 7989-2119 OT Time Calculation (min): 21 min  Charges: OT General Charges $OT Visit: 1 Visit OT Treatments $Therapeutic Exercise: 8-22 mins  Harley Alto., COTA/L Acute Rehabilitation Services [phone]   Precious Haws  12/29/2022, 3:55 PM

## 2022-12-29 NOTE — Progress Notes (Signed)
Carotid duplex has been completed.   Preliminary results in CV Proc.   [name] [name] 12/29/2022 12:00 PM

## 2022-12-29 NOTE — Progress Notes (Signed)
Initial Nutrition Assessment  DOCUMENTATION CODES:   Severe malnutrition in context of chronic illness  INTERVENTION:   Initiate tube feeding via feeding tube (PEG vs G-J): Osmolite 1.5 at 30 ml/h and increase by 10 ml every 6 hours to goal rate of 60 ml/h (1440 ml per day) Prosource TF20 60 ml daily  Provides 2240 kcal, 110 gm protein, 1094 ml free water daily  200 ml free water every 4 hours Total free water: 2294 ml   Monitor magnesium and phosphorus every 12 hours x 4 occurrences, MD to replete as needed, as pt is at risk for refeeding syndrome given severe malnutrition.  NUTRITION DIAGNOSIS:   Severe Malnutrition related to chronic illness as evidenced by severe fat depletion, severe muscle depletion.  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   Spoke with pt who is able to answer some questions but others he cannot. He states he lives with his ex-wife and is able to get to the chair by himself and can walk. He states he does not eat anything by mouth and only uses his tube for nutrition. He does not remember what formula he uses or if he gets bolus or continuous TF. His current feeding tube only has one port but is listed in the chart as both a PEG and a G-J tube and pt does not know what he has. No xray confirmation of tube available.   Pt reports weight loss recently. He states he was 210 years ago but feels he has lost weight recently. Per chart review weight is down 7% over the last 4 months. From 179.5 lb to 167.9 lb.  Pt  does meet criteria for severe malnutrition and is at risk of refeeding.  Spoke with medical team to supplement magnesium.   Medications reviewed and include: 40 mEq KCl BID, prednisone  10 mEq KCl IV x 3    Labs reviewed: K 2.8, Magnesium 1.4 CBG's: 83-122 A1C: 6.2  UOP: 1525 ml   NUTRITION - FOCUSED PHYSICAL EXAM:  Flowsheet Row Most Recent Value  Orbital Region Moderate depletion  Upper Arm Region Severe depletion  Thoracic and Lumbar Region Severe depletion  Buccal Region Moderate depletion  Temple Region Mild depletion  Clavicle Bone Region Severe depletion  Clavicle and Acromion Bone Region Moderate depletion  Scapular Bone Region Severe depletion  Dorsal Hand Severe depletion  Patellar Region Severe depletion  Anterior Thigh Region Severe depletion  Posterior Calf Region Severe depletion  Edema (RD Assessment) None  Hair Reviewed  Eyes Reviewed  Mouth Reviewed  Skin Reviewed  Nails Reviewed       Diet Order:   Diet Order             Diet NPO time specified  Diet effective now                   EDUCATION NEEDS:   No education needs have been identified at this time  Skin:  Skin Assessment: Reviewed RN Assessment  Last BM:  unknown  Height:  Ht Readings from Last 1 Encounters:  12/28/22 '6\' 1"'$  (1.854 m)    Weight:   Wt Readings from Last 1 Encounters:  12/28/22 76.2 kg    BMI:  Body mass index is 22.16 kg/m.  Estimated Nutritional Needs:   Kcal:  2100-2400  Protein:  110-130 grams  Fluid:  >2 L/day  Lockie Pares., RD, LDN, CNSC See AMiON for contact information

## 2022-12-30 ENCOUNTER — Encounter (HOSPITAL_COMMUNITY): Payer: Self-pay | Admitting: Internal Medicine

## 2022-12-30 DIAGNOSIS — A419 Sepsis, unspecified organism: Secondary | ICD-10-CM | POA: Diagnosis not present

## 2022-12-30 DIAGNOSIS — I63032 Cerebral infarction due to thrombosis of left carotid artery: Secondary | ICD-10-CM

## 2022-12-30 DIAGNOSIS — E43 Unspecified severe protein-calorie malnutrition: Secondary | ICD-10-CM | POA: Insufficient documentation

## 2022-12-30 DIAGNOSIS — J101 Influenza due to other identified influenza virus with other respiratory manifestations: Secondary | ICD-10-CM | POA: Diagnosis not present

## 2022-12-30 DIAGNOSIS — G9340 Encephalopathy, unspecified: Secondary | ICD-10-CM | POA: Diagnosis not present

## 2022-12-30 LAB — GLUCOSE, CAPILLARY
Glucose-Capillary: 111 mg/dL — ABNORMAL HIGH (ref 70–99)
Glucose-Capillary: 117 mg/dL — ABNORMAL HIGH (ref 70–99)
Glucose-Capillary: 117 mg/dL — ABNORMAL HIGH (ref 70–99)
Glucose-Capillary: 119 mg/dL — ABNORMAL HIGH (ref 70–99)
Glucose-Capillary: 141 mg/dL — ABNORMAL HIGH (ref 70–99)
Glucose-Capillary: 144 mg/dL — ABNORMAL HIGH (ref 70–99)
Glucose-Capillary: 150 mg/dL — ABNORMAL HIGH (ref 70–99)

## 2022-12-30 LAB — CBC
HCT: 26.4 % — ABNORMAL LOW (ref 39.0–52.0)
Hemoglobin: 8.9 g/dL — ABNORMAL LOW (ref 13.0–17.0)
MCH: 28.9 pg (ref 26.0–34.0)
MCHC: 33.7 g/dL (ref 30.0–36.0)
MCV: 85.7 fL (ref 80.0–100.0)
Platelets: 141 10*3/uL — ABNORMAL LOW (ref 150–400)
RBC: 3.08 MIL/uL — ABNORMAL LOW (ref 4.22–5.81)
RDW: 17.8 % — ABNORMAL HIGH (ref 11.5–15.5)
WBC: 3.9 10*3/uL — ABNORMAL LOW (ref 4.0–10.5)
nRBC: 0 % (ref 0.0–0.2)

## 2022-12-30 LAB — BASIC METABOLIC PANEL
Anion gap: 9 (ref 5–15)
BUN: 16 mg/dL (ref 8–23)
CO2: 22 mmol/L (ref 22–32)
Calcium: 7.4 mg/dL — ABNORMAL LOW (ref 8.9–10.3)
Chloride: 99 mmol/L (ref 98–111)
Creatinine, Ser: 0.86 mg/dL (ref 0.61–1.24)
GFR, Estimated: >60 mL/min (ref >60)
Glucose, Bld: 146 mg/dL — ABNORMAL HIGH (ref 70–99)
Potassium: 3.4 mmol/L — ABNORMAL LOW (ref 3.5–5.1)
Sodium: 130 mmol/L — ABNORMAL LOW (ref 135–145)

## 2022-12-30 LAB — MAGNESIUM
Magnesium: 1.9 mg/dL (ref 1.7–2.4)
Magnesium: 2.1 mg/dL (ref 1.7–2.4)

## 2022-12-30 LAB — POTASSIUM: Potassium: 3.9 mmol/L (ref 3.5–5.1)

## 2022-12-30 LAB — PHOSPHORUS
Phosphorus: 2.2 mg/dL — ABNORMAL LOW (ref 2.5–4.6)
Phosphorus: 2.8 mg/dL (ref 2.5–4.6)

## 2022-12-30 MED ORDER — THIAMINE MONONITRATE 100 MG PO TABS
100.0000 mg | ORAL_TABLET | Freq: Every day | ORAL | Status: DC
  Administered 2022-12-30 – 2023-01-06 (×7): 100 mg
  Filled 2022-12-30 (×7): qty 1

## 2022-12-30 MED ORDER — POTASSIUM & SODIUM PHOSPHATES 280-160-250 MG PO PACK
2.0000 | PACK | Freq: Once | ORAL | Status: AC
  Administered 2022-12-30: 2
  Filled 2022-12-30: qty 2

## 2022-12-30 MED ORDER — OSMOLITE 1.5 CAL PO LIQD
1000.0000 mL | ORAL | Status: DC
  Administered 2022-12-30: 1000 mL

## 2022-12-30 MED ORDER — POTASSIUM CHLORIDE 20 MEQ PO PACK
40.0000 meq | PACK | Freq: Two times a day (BID) | ORAL | Status: AC
  Administered 2022-12-30 (×2): 40 meq
  Filled 2022-12-30 (×2): qty 2

## 2022-12-30 MED ORDER — ACETAMINOPHEN 325 MG PO TABS
650.0000 mg | ORAL_TABLET | Freq: Four times a day (QID) | ORAL | Status: DC | PRN
  Administered 2022-12-30: 650 mg via ORAL
  Filled 2022-12-30: qty 2

## 2022-12-30 MED ORDER — FREE WATER
200.0000 mL | Status: DC
  Administered 2022-12-30 – 2023-01-02 (×19): 200 mL

## 2022-12-30 MED ORDER — POTASSIUM PHOSPHATES 15 MMOLE/5ML IV SOLN
30.0000 mmol | Freq: Once | INTRAVENOUS | Status: AC
  Administered 2022-12-30: 30 mmol via INTRAVENOUS
  Filled 2022-12-30: qty 10

## 2022-12-30 MED ORDER — PROSOURCE TF20 ENFIT COMPATIBL EN LIQD
60.0000 mL | Freq: Every day | ENTERAL | Status: DC
  Administered 2022-12-30 – 2022-12-31 (×2): 60 mL
  Filled 2022-12-30 (×2): qty 60

## 2022-12-30 MED ORDER — MAGNESIUM SULFATE 2 GM/50ML IV SOLN
2.0000 g | Freq: Once | INTRAVENOUS | Status: AC
  Administered 2022-12-30: 2 g via INTRAVENOUS
  Filled 2022-12-30: qty 50

## 2022-12-30 NOTE — Progress Notes (Signed)
Physical Therapy Treatment Patient Details Name: [name] MRN: [id number] DOB: 12/16/1946 Today's Date: 12/30/2022   History of Present Illness [name] is a 77 y.o. male who presents after being found on floor unable to talk and get up.  MRI positive for scattered foci of restricted diffusion involving the high left frontal lobe and high left parietal lobe compatible with acute/subacute nonhemorrhagic infarcts.  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  He also tested positive for the flu A and possible aspiration PNA.  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 Per RN, pt with 102 degree fever and plan for procedure this afternoon. Pt reported not feeling well and politely declined OOB mobility or working on bed level leg exercises; however was agreeable to working on exercises for R arm/hand. Pt performed the exercises listed below with assist to keep wrist/forearm in neutral. Pt unable to perform R thumb abduction or digit opposition/abduction exercises. Despite feeling ill, pt appreciative of time spent working on exercises. Acute PT to cont to follow.     Recommendations for follow up therapy are one component of a multi-disciplinary discharge planning process, led by the attending physician.  Recommendations may be updated based on patient status, additional functional criteria and insurance authorization.  Follow Up Recommendations  Home health PT     Assistance Recommended at Discharge Intermittent Supervision/Assistance  Patient can return home with the following A little help with walking and/or transfers;A little help with bathing/dressing/bathroom;Assistance with cooking/housework;Assist for  transportation;Help with stairs or ramp for entrance   Equipment Recommendations  Rolling walker (2 wheels)    Recommendations for Other Services       Precautions / Restrictions Precautions Precautions: Fall Precaution Comments: peg tube, flu + Restrictions Weight Bearing Restrictions: No     Mobility  Bed Mobility                 Patient Response: Flat affect  Transfers                        Ambulation/Gait                   Stairs             Wheelchair Mobility    Modified Rankin (Stroke Patients Only)       Balance                                            Cognition Arousal/Alertness: Lethargic Behavior During Therapy: Flat affect Overall Cognitive Status: Within Functional Limits for tasks assessed                                          Exercises Hand Exercises Forearm Supination: AROM, Right (x8 reps) Wrist Extension: AROM, Right (x8 reps) Wrist Ulnar Deviation: AROM, Right (x8 reps) Wrist Radial Deviation: AROM, Right (x8 reps)    General Comments General comments (skin integrity, edema, etc.): Pt reported not feeling well today. Declined OOB  or bed level LE exercises but was agreeable to working on exercises for R wrist. Per RN, pt to go for provedure later today but currently has fever of 102 degrees      Pertinent Vitals/Pain Pain Assessment Pain Assessment: No/denies pain    Home Living                          Prior Function            PT Goals (current goals can now be found in the care plan section) Acute Rehab PT Goals Patient Stated Goal: to get home and stronger PT Goal Formulation: With patient Time For Goal Achievement: 01/11/23 Potential to Achieve Goals: Good    Frequency    Min 4X/week      PT Plan Current plan remains appropriate    Co-evaluation              AM-PAC PT "6 Clicks" Mobility   Outcome Measure                    End of Session   Activity Tolerance: Patient limited by fatigue;Other (comment) (pt feeling ill) Patient left: in bed;with call bell/phone within reach;with nursing/sitter in room Nurse Communication: Mobility status PT Visit Diagnosis: Unsteadiness on feet (R26.81);Muscle weakness (generalized) (M62.81);Difficulty in walking, not elsewhere classified (R26.2)     Time: 6378-5885 PT Time Calculation (min) (ACUTE ONLY): 11 min  Charges:  $Therapeutic Exercise: 8-22 mins                     Becky Sax PT, DPT  Blenda Nicely 12/30/2022, 10:24 AM

## 2022-12-30 NOTE — Progress Notes (Signed)
Mobility Specialist Progress Note   12/30/22 1455  Mobility  Activity Moved into chair position in bed (Bed level exercise)  Level of Assistance Standby assist, set-up cues, supervision of patient - no hands on  Assistive Device None  Range of Motion/Exercises Active;All extremities  Activity Response Tolerated fair (not feeling well)   Patient received in supine and reluctant to participate second to not feeling well. Completed low level bed exercises with supervision.Tolerated without complaint or incident. Was left in supine with all needs met, call bell in reach.   [place] Peggy Loge, BS EXP Mobility Specialist Please contact via SecureChat or Rehab office at [phone]

## 2022-12-30 NOTE — Progress Notes (Addendum)
Subjective:  Patient received 1x K phos 76mol overnight for phosphorus of 1.6.   Patient reports he is not feeling well this morning and states that he feels warm. Discussed continuing antibiotics for today given his fever. Patient reports that he receives continuous feeds at home per his PEG tube.  Spoke with patient's son ([name] and ex-wife ([name], who confirmed that patient was receiving continuous tube feeds at home. They also stated that patient will return to live with ex-wife once discharged from the hospital.   Objective:  Vital signs in last 24 hours: Vitals:   12/30/22 0500 12/30/22 0832 12/30/22 0945 12/30/22 1209  BP:  133/64  (!) 107/58  Pulse:  74  66  Resp:  20  20  Temp:  (!) 102 F (38.9 C) 98.9 F (37.2 C) 98.3 F (36.8 C)  TempSrc:  Oral  Oral  SpO2:  97%  95%  Weight: 76.2 kg     Height:       Weight change: 0 kg  Intake/Output Summary (Last 24 hours) at 12/30/2022 1408 Last data filed at 12/30/2022 0830 Gross per 24 hour  Intake 2270.1 ml  Output 575 ml  Net 1695.1 ml   Physical Exam General: Alert, appears mildly uncomfortable. HEENT: Normocephalic, atraumatic CV: RRR, no murmurs, rubs, or gallops Pulm: Normal respiratory effort. Intermittent cough. Crackles in mid and lower left lobes bilaterally.  Skin: Warm and dry Ext: No LE edema Neuro: 0/5 grip strength on R hand, some mobility of the R wrist, otherwise normal strength   Assessment/Plan:  Principal Problem:   Acute encephalopathy Active Problems:   Sepsis without acute organ dysfunction ([name]   Influenza A   Cerebrovascular accident (CVA) ([name]   Protein-calorie malnutrition, severe  HRICH PAPROCKIis a 77year old man with medical history significant for PMH of RA, ILD, Hx of DVT/PE, T2DM, HTN, HLD, Esophageal outlet obstructions s/p multiple dilatations and GJ tube placement for FTT, severe protein calorie malnutrition, 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    Sepsis with Acute Encephalopathy Recent hx of pneumonia Influenza A Patient had a Tmax to 102F this morning, BP remained stable. Blood cultures showed no growth at 3 days. Suspect patients new fever is likely secondary to the flu but will obtain blood cultures to rule out bacteremia. Will continue cefepime today in setting of fever.  -F/u repeat blood culture -Continue cefepime -Continue Tamiflu x5 days total (day 4)   Left MCA CVA  Carotid U/S showed 40-59% stenosis in left ICA, 1-39% stenosis in right ICA. Neuro recommends IR evaluation for possible diagnostic angio/elective stenting. Neuro IR was consulted with plan for diagnostic angiogram today. Procedure was cancelled given new fever and blood cultures ordered. Neuro IR and neuro okay with diagnostic angiogram as outpatient.  -Continue ASA '81mg'$  -Continue clopidogrel '75mg'$  -Atorvastatin 40 mg daily  -PT/OT/SLP -Neuro checks -Telemetry  Malnutrition Hypokalemia Hypomagnesemia Phosphorus improved 1.6>>2.2, magnesium 1.9 today. Potassium improved 2.7>>3.4. Patient's family states he is on continuous tube feeds at home but has been stopping them for doctors appointments/ED visits/hospitalizations. Stopped tube feeds/free water this morning for planned diagnostic angiogram. Restarted tube feeds/free water following cancellation of procedure. Will plan to recheck albumin and continue to replete electrolytes as needed.  -Magnesium sulfate 2g 569m -Potassium and sodium phosphates 280-160-250 mg packet  -Potassium phosphate 30 mmol in dextrose 5% infusion -Osmolite 1000 mL (60 mL/hr) per tube -Prosouce 60 mL daily per tube -Free  water flushes 200 mL q4 hours per tube -CMP tomorrow morning  T2DM  A1c 6.2%. PTA on metformin, glucose 77-146 overnight. Will continue to monitor.  -Trend CBGs  Chronic Steroids  -Prednisone '1mg'$  daily, per tube  Esophageal Outlet Obstruction with PEG tube  Plan for patient to  follow up with outpatient GI for further workup if needed.     Dispo  Called and spoke with patient's ex-wife, who understands and agrees with plan for patient to discharge to her home. OT recommends outpatient OT PRN w/o equipment. PT recommends HH PT w/ rolling walker. SLP recommends NPO and outpatient SLP.    Diet: Tube feeds Bowel: none VTE: SCDs  IVF: None Code: Full   Prior to Admission Living Arrangement: at home w/ ex-wife  Anticipated Discharge Location: TBD Barriers to Discharge: continued management Dispo: Anticipated discharge in approximately less than 2 day(s).    LOS: 3 days   Spero Curb, Medical Student 12/30/2022, 2:08 PM  Attestation for Student Documentation:  I personally was present and performed or re-performed the history, physical exam and medical decision-making activities of this service and have verified that the service and findings are accurately documented in the student's note.  Starlyn Skeans, MD 12/30/2022, 3:33 PM

## 2022-12-30 NOTE — Consult Note (Signed)
Chief Complaint: Patient was seen in consultation today for acute infarcts, code stroke  Referring Physician(s): Dr. Leonie Man  Supervising Physician: Pedro Earls  Patient Status: [facility] - In-pt  History of Present Illness: [name] is a 76 y.o. 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  Positive for advanced atherosclerosis of the neck and head with complex plaque at the left ICA origin and bulb with estimated 60 to 65% stenosis, up to severe left ICA cavernous segment stenosis due to bulky calcified plaque, up to moderate right ICA siphon stenosis, severe right vertebral artery origin stenosis, up to moderate left vertebral artery stenosis. CT perfusion was negative.  He was admitted for SIRS/sepsis and ongoing neuro monitoring/intervention.  IR consulted for diagnostic angiogram. Case reviewed and approved by Dr. Karenann Cai.   Patient assessed at bedside this AM.  He is found resting comfortably in bed.  States "I have a new infection."  He did spike a fever this AM to 102. Per RN, patient now getting blood cultures drawn. All other vitals remain stable.  He gives permission to discuss medical care/needs with his son, [name].   Past Medical History:  Diagnosis Date   Aortic aneurysm ([place]) 05/2017   Chronic back pain    Diabetes mellitus without complication (HCC)    Dyslipidemia    GERD (gastroesophageal reflux disease)    H/O hiatal hernia    Hypertension    Melanoma ([place])    "back; left arm"   PONV (postoperative nausea and vomiting)    Rheumatoid arthritis ([place])    Spasm of esophagus     Past Surgical History:  Procedure Laterality Date   aneursym repair     aneursym repair and stent placement  05/2017   BIOPSY  08/19/2022   Procedure: BIOPSY;  Surgeon: Ladene Artist, MD;   Location: [facility];  Service: Gastroenterology;;   CARPAL TUNNEL RELEASE Bilateral    COLON SURGERY  2007   removed 18" of colon   ERCP W/ METAL STENT PLACEMENT  11/2005   Archie Endo 12/14/2005   ESOPHAGOGASTRODUODENOSCOPY (EGD) WITH ESOPHAGEAL DILATION     "several times'   ESOPHAGOGASTRODUODENOSCOPY (EGD) WITH PROPOFOL N/A 08/19/2022   Procedure: ESOPHAGOGASTRODUODENOSCOPY (EGD) WITH PROPOFOL;  Surgeon: Ladene Artist, MD;  Location: [place];  Service: Gastroenterology;  Laterality: N/A;   HAND SURGERY Bilateral    "plastic knuckles"   LAPAROSCOPIC CHOLECYSTECTOMY  01/2006   MELANOMA EXCISION     "back; left arm"   NASAL SEPTUM SURGERY     PELVIC FRACTURE SURGERY  1964   "busted in 2 places"   RESECTION DISTAL CLAVICAL Right 05/13/2016   Procedure: DISTAL CLAVICLE EXCISION;  Surgeon: Ninetta Lights, MD;  Location: [place];  Service: Orthopedics;  Laterality: Right;   SHOULDER ARTHROSCOPY WITH ROTATOR CUFF REPAIR AND SUBACROMIAL DECOMPRESSION Right 05/13/2016   Procedure: RIGHT SHOULDER SCOPE DEBRIDEMENT, ACROMIOPLASTY, ROTATOR CUFF REPAIR; RELEASE BICEPS TENDON AND DEBRIDEMENT LABRUM;  Surgeon: Ninetta Lights, MD;  Location: [place];  Service: Orthopedics;  Laterality: Right;   TIBIA FRACTURE SURGERY Left 2006   "put a steel rod in it"    Allergies: Patient has no known allergies.  Medications: Prior to Admission medications   Medication Sig Start Date End Date Taking? Authorizing Provider  abatacept (ORENCIA) 250 MG  injection Inject 250 mg into the vein every 30 (thirty) days. Patient taking differently: Inject 750 mg into the vein every 30 (thirty) days. 02/12/21  Yes Sanjuan Dame, MD  atorvastatin (LIPITOR) 40 MG tablet Take 1 tablet (40 mg total) by mouth daily. 08/27/22  Yes Leigh Aurora, DO  famotidine (PEPCID) 20 MG tablet Take 1 tablet (20 mg total) by mouth daily. 08/27/22 08/27/23 Yes Patel, Hyman Hopes, DO  ferrous sulfate 300 (60 Fe)  MG/5ML syrup Take 5 mLs (300 mg total) by mouth daily. 08/27/22  Yes Leigh Aurora, DO  folic acid (FOLVITE) 1 MG tablet Take 1 tablet (1 mg total) by mouth daily. 08/27/22  Yes Leigh Aurora, DO  levothyroxine (SYNTHROID) 50 MCG tablet Take 1 tablet (50 mcg total) by mouth daily. 08/27/22  Yes Leigh Aurora, DO  magnesium oxide (MAG-OX) 400 (240 Mg) MG tablet Take 1 tablet (400 mg total) by mouth daily. 08/27/22  Yes Leigh Aurora, DO  metFORMIN (GLUCOPHAGE) 500 MG tablet Take 1 tablet (500 mg total) by mouth daily. 08/27/22  Yes Leigh Aurora, DO  Methotrexate 25 MG/ML SOSY Inject 15 mg into the skin once a week.   Yes [provider]  ondansetron (ZOFRAN) 4 MG tablet Take 1 tablet (4 mg total) by mouth every 12 (twelve) hours as needed for nausea or vomiting. 08/27/22  Yes Leigh Aurora, DO  oxyCODONE (OXY IR/ROXICODONE) 5 MG immediate release tablet Take 1 tablet (5 mg total) by mouth every 4 (four) hours as needed for moderate pain. 08/27/22  Yes Rick Duff, MD  pantoprazole (PROTONIX) 40 MG tablet Take 1 tablet (40 mg total) by mouth 2 (two) times daily. Via tube 08/27/22  Yes Rick Duff, MD  predniSONE (DELTASONE) 1 MG tablet Take 1-5 tablets (1-5 mg total) by mouth See admin instructions. 5 mg daily for 1 month  4 mg daily for 1 month, 3 mg daily for 1 month, 2 mg daily for 1 month, 1 mg daily for 1 month. 08/27/22  Yes Patel, Amar, DO  sucralfate (CARAFATE) 1 GM/10ML suspension Take 1 g by mouth 4 (four) times daily -  before meals and at bedtime.   Yes [provider]  tadalafil (CIALIS) 5 MG tablet Take 2 tablets (10 mg total) by mouth daily. 08/27/22  Yes Leigh Aurora, DO  alum & mag hydroxide-simeth (MAALOX/MYLANTA) 200-200-20 MG/5ML suspension Take 30 mLs by mouth 3 (three) times daily as needed for indigestion or heartburn. Patient not taking: Reported on 12/27/2022 08/27/22   Rick Duff, MD  diclofenac Sodium (VOLTAREN) 1 % GEL Apply 2 g topically 4 (four) times daily. Patient  not taking: Reported on 12/28/2022 08/27/22   Rick Duff, MD  enoxaparin (LOVENOX) 40 MG/0.4ML injection Inject 40 mg into the skin daily. Patient not taking: Reported on 12/28/2022 08/06/22   [provider]  lidocaine (XYLOCAINE) 2 % solution Use as directed 15 mLs in the mouth or throat 3 (three) times daily as needed for mouth pain (Indigestion). Patient not taking: Reported on 12/28/2022 08/27/22   Rick Duff, MD     Family History  Problem Relation Age of Onset   Heart disease Mother    Heart disease Father    Cancer Maternal Grandmother     Social History   Socioeconomic History   Marital status: Divorced    Spouse name: Not on file   Number of children: Not on file   Years of education: Not on file   Highest education level: Not on file  Occupational  History   Not on file  Tobacco Use   Smoking status: Former    Packs/day: 1.00    Years: 46.00    Total pack years: 46.00    Types: Cigarettes    Quit date: 11/26/2005    Years since quitting: 17.1   Smokeless tobacco: Never  Substance and Sexual Activity   Alcohol use: Yes    Alcohol/week: 9.0 standard drinks of alcohol    Types: 9 Shots of liquor per week    Comment: 05/23/2014 "3, 1 shot drinks maybe 3 times/wk"   Drug use: No   Sexual activity: Not Currently  Other Topics Concern   Not on file  Social History Narrative   Not on file   Social Determinants of Health   Financial Resource Strain: Not on file  Food Insecurity: No Food Insecurity (12/28/2022)   Hunger Vital Sign    Worried About Running Out of Food in the Last Year: Never true    Ran Out of Food in the Last Year: Never true  Transportation Needs: No Transportation Needs (12/28/2022)   PRAPARE - Hydrologist (Medical): No    Lack of Transportation (Non-Medical): No  Physical Activity: Not on file  Stress: Not on file  Social Connections: Not on file     Review of Systems: A 12 point ROS discussed and  pertinent positives are indicated in the HPI above.  All other systems are negative.  Review of Systems  Constitutional:  Positive for fatigue and fever.  Respiratory:  Negative for cough and shortness of breath.   Cardiovascular:  Negative for chest pain.  Gastrointestinal:  Negative for abdominal pain, nausea and vomiting.  Musculoskeletal:  Negative for back pain.  Psychiatric/Behavioral:  Negative for behavioral problems and confusion.     Vital Signs: BP 133/64 (BP Location: Left Arm)   Pulse 74   Temp (!) 102 F (38.9 C) (Oral)   Resp 20   Ht '6\' 1"'$  (1.854 m)   Wt 167 lb 15.9 oz (76.2 kg)   SpO2 97%   BMI 22.16 kg/m   Physical Exam Vitals and nursing note reviewed.  Constitutional:      General: He is not in acute distress.    Appearance: Normal appearance. He is ill-appearing.  HENT:     Mouth/Throat:     Mouth: Mucous membranes are moist.     Pharynx: Oropharynx is clear.  Cardiovascular:     Rate and Rhythm: Normal rate and regular rhythm.  Pulmonary:     Effort: Pulmonary effort is normal. No respiratory distress.     Breath sounds: Normal breath sounds.  Abdominal:     Comments: G-tube in place.   Skin:    General: Skin is warm and dry.  Neurological:     General: No focal deficit present.     Mental Status: He is alert and oriented to person, place, and time. Mental status is at baseline.  Psychiatric:        Mood and Affect: Mood normal.        Behavior: Behavior normal.        Thought Content: Thought content normal.        Judgment: Judgment normal.      MD Evaluation Airway: WNL Heart: WNL Abdomen: WNL Chest/ Lungs: WNL ASA  Classification: 3 Mallampati/Airway Score: Two   Imaging: VAS US CAROTID  Result Date: 12/29/2022 Carotid Arterial Duplex Study Patient Name:  [name]  Date of  Exam:   12/29/2022 Medical Rec #: [id number]           Accession #:    [id number] Date of Birth: 04-24-1946          Patient Gender: M Patient Age:    87 years Exam Location:  [facility] Procedure:      VAS US CAROTID Referring Phys: PRAMOD SETHI --------------------------------------------------------------------------------  Indications:       CVA. Risk Factors:      Hypertension, Diabetes. Comparison Study:  no prior Performing Technologist: Archie Patten RVS  Examination Guidelines: A complete evaluation includes B-mode imaging, spectral Doppler, color Doppler, and power Doppler as needed of all accessible portions of each vessel. Bilateral testing is considered an integral part of a complete examination. Limited examinations for reoccurring indications may be performed as noted.  Right Carotid Findings: +----------+--------+--------+--------+-------------------------+--------+           PSV cm/sEDV cm/sStenosisPlaque Description       Comments +----------+--------+--------+--------+-------------------------+--------+ CCA Prox  60                      heterogenous and calcific         +----------+--------+--------+--------+-------------------------+--------+ CCA Distal50      9               heterogenous and calcific         +----------+--------+--------+--------+-------------------------+--------+ ICA Prox  153     34      1-39%   heterogenous                      +----------+--------+--------+--------+-------------------------+--------+ ICA Distal96      27                                                +----------+--------+--------+--------+-------------------------+--------+ ECA       301     17                                                +----------+--------+--------+--------+-------------------------+--------+ +----------+--------+-------+--------+-------------------+           PSV cm/sEDV cmsDescribeArm Pressure (mmHG) +----------+--------+-------+--------+-------------------+ [id number]                                        +----------+--------+-------+--------+-------------------+  +---------+--------+--+--------+-+---------+ VertebralPSV cm/s42EDV cm/s8Antegrade +---------+--------+--+--------+-+---------+  Left Carotid Findings: +---------+--------+-------+--------+---------------------------------+--------+          PSV cm/sEDV    StenosisPlaque Description               Comments                  cm/s                                                     +---------+--------+-------+--------+---------------------------------+--------+ CCA Prox 103     13             heterogenous and calcific                 +---------+--------+-------+--------+---------------------------------+--------+  CCA      72      14             heterogenous                              Distal                                                                    +---------+--------+-------+--------+---------------------------------+--------+ ICA Prox 270     56     40-59%  heterogenous, calcific and                                                irregular                                 +---------+--------+-------+--------+---------------------------------+--------+ ICA Mid  139     28                                                       +---------+--------+-------+--------+---------------------------------+--------+ ICA      85      21                                                       Distal                                                                    +---------+--------+-------+--------+---------------------------------+--------+ ECA      178     24                                                       +---------+--------+-------+--------+---------------------------------+--------+ +----------+--------+--------+--------+-------------------+           PSV cm/sEDV cm/sDescribeArm Pressure (mmHG) +----------+--------+--------+--------+-------------------+ [id number]                                          +----------+--------+--------+--------+-------------------+ +---------+--------+--+--------+--+---------+ VertebralPSV cm/s43EDV cm/s15Antegrade +---------+--------+--+--------+--+---------+   Summary: Right Carotid: Velocities in the right ICA are consistent with a 1-39% stenosis. Left Carotid: Velocities in the left ICA are consistent with a 40-59% stenosis. Vertebrals: Bilateral vertebral arteries demonstrate antegrade flow. *See table(s) above for measurements and observations.  Electronically signed  by Orlie Pollen on 12/29/2022 at 8:18:31 PM.    Final    ECHOCARDIOGRAM COMPLETE  Result Date: 12/28/2022    ECHOCARDIOGRAM REPORT   Patient Name:   [name] Date of Exam: 12/28/2022 Medical Rec #:  [id number]          Height:       73.0 in Accession #:    [id number]         Weight:       173.3 lb Date of Birth:  1946-09-03         BSA:          2.024 m Patient Age:    18 years           BP:           131/72 mmHg Patient Gender: M                  HR:           69 bpm. Exam Location:  Inpatient Procedure: 2D Echo, Cardiac Doppler and Color Doppler Indications:    Stroke I63.9  History:        Patient has prior history of Echocardiogram examinations, most                 recent 08/16/2012. Signs/Symptoms:Dyspnea; Risk                 Factors:Hypertension and Diabetes.  Sonographer:    Ronny Flurry Referring Phys: [id number] [place]  1. Left ventricular ejection fraction, by estimation, is 60 to 65%. The left ventricle has normal function. The left ventricle has no regional wall motion abnormalities. Left ventricular diastolic parameters were normal.  2. Right ventricular systolic function is normal. The right ventricular size is normal.  3. The mitral valve is normal in structure. No evidence of mitral valve regurgitation. No evidence of mitral stenosis. Moderate mitral annular calcification.  4. The aortic valve is tricuspid. There is mild calcification of the aortic valve. Aortic  valve regurgitation is not visualized. Aortic valve sclerosis is present, with no evidence of aortic valve stenosis.  5. The inferior vena cava is dilated in size with <50% respiratory variability, suggesting right atrial pressure of 15 mmHg. FINDINGS  Left Ventricle: Left ventricular ejection fraction, by estimation, is 60 to 65%. The left ventricle has normal function. The left ventricle has no regional wall motion abnormalities. The left ventricular internal cavity size was normal in size. There is  no left ventricular hypertrophy. Left ventricular diastolic parameters were normal. Normal left ventricular filling pressure. Right Ventricle: The right ventricular size is normal. No increase in right ventricular wall thickness. Right ventricular systolic function is normal. Left Atrium: Left atrial size was normal in size. Right Atrium: Right atrial size was normal in size. Pericardium: There is no evidence of pericardial effusion. Mitral Valve: The mitral valve is normal in structure. Moderate mitral annular calcification. No evidence of mitral valve regurgitation. No evidence of mitral valve stenosis. Tricuspid Valve: The tricuspid valve is normal in structure. Tricuspid valve regurgitation is not demonstrated. No evidence of tricuspid stenosis. Aortic Valve: The aortic valve is tricuspid. There is mild calcification of the aortic valve. Aortic valve regurgitation is not visualized. Aortic valve sclerosis is present, with no evidence of aortic valve stenosis. Aortic valve mean gradient measures 3.0 mmHg. Aortic valve peak gradient measures 5.7 mmHg. Aortic valve area, by VTI measures 2.99 cm. Pulmonic Valve: The pulmonic valve was normal in structure. Pulmonic  valve regurgitation is not visualized. No evidence of pulmonic stenosis. Aorta: The aortic root is normal in size and structure. Venous: The inferior vena cava is dilated in size with less than 50% respiratory variability, suggesting right atrial pressure  of 15 mmHg. IAS/Shunts: No atrial level shunt detected by color flow Doppler.  LEFT VENTRICLE PLAX 2D LVIDd:         5.10 cm   Diastology LVIDs:         3.70 cm   LV e' medial:    9.46 cm/s LV PW:         1.00 cm   LV E/e' medial:  9.7 LV IVS:        0.90 cm   LV e' lateral:   10.80 cm/s LVOT diam:     2.00 cm   LV E/e' lateral: 8.5 LV SV:         80 LV SV Index:   40 LVOT Area:     3.14 cm  RIGHT VENTRICLE            IVC RV S prime:     8.81 cm/s  IVC diam: 2.40 cm TAPSE (M-mode): 2.2 cm LEFT ATRIUM             Index        RIGHT ATRIUM           Index LA diam:        3.80 cm 1.88 cm/m   RA Area:     18.30 cm LA Vol (A2C):   61.9 ml 30.58 ml/m  RA Volume:   48.90 ml  24.16 ml/m LA Vol (A4C):   48.1 ml 23.76 ml/m LA Biplane Vol: 60.6 ml 29.93 ml/m  AORTIC VALVE AV Area (Vmax):    2.57 cm AV Area (Vmean):   2.76 cm AV Area (VTI):     2.99 cm AV Vmax:           119.50 cm/s AV Vmean:          80.250 cm/s AV VTI:            0.269 m AV Peak Grad:      5.7 mmHg AV Mean Grad:      3.0 mmHg LVOT Vmax:         97.70 cm/s LVOT Vmean:        70.600 cm/s LVOT VTI:          0.256 m LVOT/AV VTI ratio: 0.95  AORTA Ao Root diam: 3.70 cm Ao Asc diam:  3.50 cm MITRAL VALVE MV Area (PHT): 3.84 cm    SHUNTS MV Decel Time: 198 msec    Systemic VTI:  0.26 m MV E velocity: 91.50 cm/s  Systemic Diam: 2.00 cm MV A velocity: 93.55 cm/s MV E/A ratio:  0.98 Mihai Croitoru MD Electronically signed by Sanda Klein MD Signature Date/Time: 12/28/2022/9:18:28 AM    Final    MR BRAIN W WO CONTRAST  Result Date: 12/27/2022 CLINICAL DATA:  Code stroke. Patient found on floor this morning. Abnormal speech. Right arm weakness. EXAM: MRI HEAD WITHOUT AND WITH CONTRAST TECHNIQUE: Multiplanar, multiecho pulse sequences of the brain and surrounding structures were obtained without and with intravenous contrast. CONTRAST:  7.83m GADAVIST GADOBUTROL 1 MMOL/ML IV SOLN COMPARISON:  CT head and CTA head and neck 12/27/2022 FINDINGS: Brain:  Diffusion-weighted images demonstrate scattered foci of restricted diffusion involving the high left frontal lobe. There is some involvement of the primary motor cortex. Scattered punctate  areas are present in the high left parietal lobe as well. A single focus scratched at a single punctate focus of restricted diffusion is present in adjacent to the left lateral ventricle on image 83 of the axial diffusion sequence. More remote cortical infarcts are present in the posteromedial left parietal lobe FLAIR signal is associated with the areas of acute infarction. Confluent periventricular T2 hyperintensities are present bilaterally. The ventricles are proportionate to the degree of atrophy. Deep brain nuclei are within normal limits. Remote lacunar infarcts are present in the thalami bilaterally. The internal auditory canals are within normal limits. The brainstem and cerebellum are within normal limits. Postcontrast images demonstrate no pathologic enhancement. Vascular: Flow is present in the major intracranial arteries. Skull and upper cervical spine: The craniocervical junction is normal. Upper cervical spine is within normal limits. Marrow signal is unremarkable. Sinuses/Orbits: Mild mucosal thickening is present in the inferior left maxillary sinus. Anterior left ethmoid air cells are opacified. Minimal fluid is present in the sphenoid sinuses. Small mastoid effusions are present. Bilateral lens replacements are noted. Globes and orbits are otherwise unremarkable. IMPRESSION: 1. Scattered foci of restricted diffusion involving the high left frontal lobe and high left parietal lobe compatible with acute/subacute nonhemorrhagic infarcts. 2. 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 3. Remote lacunar infarcts of the thalami bilaterally. 4. Confluent periventricular T2 hyperintensities bilaterally likely reflect the sequela of chronic microvascular ischemia. 5. Mild sinus disease.  These results were called by telephone at the time of interpretation on 12/27/2022 at 3:00 pm to provider [facility] , who verbally acknowledged these results. Electronically Signed   By: San Morelle M.D.   On: 12/27/2022 15:00   DG Chest Port 1 View  Result Date: 12/27/2022 CLINICAL DATA:  Sepsis EXAM: PORTABLE CHEST 1 VIEW COMPARISON:  01/26/2021 FINDINGS: Pulmonary vascular congestion noted. No focal consolidation. There may be small left-sided pleural effusion. No pneumothorax identified. Aorta is calcified. IMPRESSION: Pulmonary vascular congestion without focal consolidation. Possible small left-sided pleural effusion. Electronically Signed   By: Sammie Bench M.D.   On: 12/27/2022 11:22   CT ANGIO HEAD NECK W WO CM W PERF (CODE STROKE)  Result Date: 12/27/2022 CLINICAL DATA:  77 year old male code stroke presentation. EXAM: CT ANGIOGRAPHY HEAD AND NECK CT PERFUSION BRAIN TECHNIQUE: Multidetector CT imaging of the head and neck was performed using the standard protocol during bolus administration of intravenous contrast. Multiplanar CT image reconstructions and MIPs were obtained to evaluate the vascular anatomy. Carotid stenosis measurements (when applicable) are obtained utilizing NASCET criteria, using the distal internal carotid diameter as the denominator. Multiphase CT imaging of the brain was performed following IV bolus contrast injection. Subsequent parametric perfusion maps were calculated using RAPID software. RADIATION DOSE REDUCTION: This exam was performed according to the departmental dose-optimization program which includes automated exposure control, adjustment of the mA and/or kV according to patient size and/or use of iterative reconstruction technique. CONTRAST:  168m OMNIPAQUE IOHEXOL 350 MG/ML SOLN COMPARISON:  Plain head CT 0943 hours today. Neck CT 01/24/2015. FINDINGS: CT Brain Perfusion Findings: ASPECTS: 10 CBF (<30%) Volume: 021m  No CBF or CBV parameter  abnormality. Perfusion (Tmax>6.0s) volume: 81m69mismatch Volume: Not applicable Infarction Location:Not applicable CTA NECK Skeleton: Advanced cervical spine degeneration. No acute osseous abnormality identified. Upper chest: Retained food or debris in the thoracic esophagus which is only mildly dilated. Upper lung scarring. No superior mediastinal lymphadenopathy. Other neck: 2016 right level 2 lymphadenopathy does not persist. No neck mass identified.  Aortic arch: Extensive Calcified aortic atherosclerosis. 3 vessel arch configuration. Right carotid system: Brachiocephalic artery and right CCA origin plaque without stenosis. Widespread right CCA plaque with no stenosis before the bifurcation. Bulky calcified plaque at the bifurcation and proximal right ICA but less than 50 % stenosis with respect to the distal vessel. Left carotid system: Similar widespread atherosclerosis. But more complex soft and calcified plaque at the left ICA origin and bulb resulting in bulb level stenosis estimated at 60-65 % with respect to the distal vessel. Left ICA remains patent to the skull base. Vertebral arteries: Proximal right subclavian artery plaque without stenosis. Calcified plaque at the right vertebral artery origin with severe stenosis (series 11, image 160). But the vessel remains patent and is fairly codominant to the skull base. Extensive proximal left subclavian artery plaque. Calcified left vertebral artery origin and V1 segment with mild-to-moderate stenosis. Additional left V1 calcified plaque with only mild stenosis. The left vertebral artery is mildly dominant and patent to the skull base. CTA HEAD Posterior circulation: Non dominant right V4 segment is patent to the vertebrobasilar junction with normal PICA origin and no significant plaque or stenosis. Left V4 is mildly dominant with calcified plaque and mild to moderate stenosis on series 8, image 80 upstream of normal left PICA origin. Patent vertebrobasilar  junction and basilar artery without stenosis. SCA and PCA origins are normal. Posterior communicating arteries are diminutive or absent. Bilateral PCA branches are within normal limits. Anterior circulation: Both ICA siphons are patent. However, there is bulky calcified siphon plaque bilaterally, greater on the left with associated severe left cavernous segment stenosis (series 8, image 61 and series 12, image 116. Contralateral right siphon calcified plaque with mild to moderate cavernous segment stenosis. Patent carotid termini, MCA and ACA origins. Normal anterior communicating artery. Bilateral ACA branches are within normal limits. Left MCA M1 segment and trifurcation are patent without stenosis. Right MCA M1 segment and trifurcation are patent without stenosis. Right MCA branches are within normal limits. Left MCA branches are patent with mild irregularity. No branch occlusion is identified. Venous sinuses: Early contrast timing, grossly patent. Anatomic variants: Dominant left vertebral artery. Review of the MIP images confirms the above findings IMPRESSION: 1. Negative for large vessel occlusion, and negative CT Perfusion. 2. Positive for advanced atherosclerosis in the head and neck: - complex plaque at the left ICA origin and bulb with estimated 60-65% stenosis. - up to Severe Left ICA cavernous segment stenosis due to bulky calcified plaque. - up to Moderate right ICA siphon stenosis. - Severe Right Vertebral Artery origin stenosis. - up to Moderate Left Vertebral Artery stenosis. 3. Partially visible retained food or debris in the thoracic esophagus, but only mild esophageal dilatation favoring esophageal dysmotility rather than achalasia. Follow-up esophagram would best evaluate further. 4.  Aortic Atherosclerosis (ICD10-I70.0). CTP and Vascular findings were communicated to Dr. Curly Shores at 10:26 am on 12/27/2022 by text page via the [facility] messaging system. Electronically Signed   By: Genevie Ann M.D.   On:  12/27/2022 10:29   CT HEAD CODE STROKE WO CONTRAST  Result Date: 12/27/2022 CLINICAL DATA:  Code stroke.  77 year old male EXAM: CT HEAD WITHOUT CONTRAST TECHNIQUE: Contiguous axial images were obtained from the base of the skull through the vertex without intravenous contrast. RADIATION DOSE REDUCTION: This exam was performed according to the departmental dose-optimization program which includes automated exposure control, adjustment of the mA and/or kV according to patient size and/or use of iterative reconstruction technique. COMPARISON:  Head  CT 08/19/2022. FINDINGS: Brain: Stable cerebral volume. No midline shift, ventriculomegaly, mass effect, evidence of mass lesion, intracranial hemorrhage or evidence of cortically based acute infarction. Gray-white matter differentiation appears stable since last year and largely normal for age throughout the brain. Vascular: Calcified atherosclerosis at the skull base. No suspicious intracranial vascular hyperdensity. Skull: No acute osseous abnormality identified. Sinuses/Orbits: Chronic paranasal sinus disease and prior sinus surgery. Sinus aeration not significantly changed from August. Tympanic cavities and mastoids remain clear. Other: No acute orbit or scalp soft tissue finding. ASPECTS [facility] Stroke Program Early CT Score) Total score (0-10 with 10 being normal): 10 IMPRESSION: 1. Stable and normal for age noncontrast CT appearance of the brain. 2. ASPECTS 10. Electronically Signed   By: Genevie Ann M.D.   On: 12/27/2022 09:50    Labs:  CBC: Recent Labs    12/27/22 0946 12/28/22 0301 12/29/22 0325 12/30/22 0548  WBC 8.2 3.8* 2.1* 3.9*  HGB 10.3* 9.6* 8.6* 8.9*  HCT 31.6* 30.6* 25.7* 26.4*  PLT 187 168 153 141*    COAGS: Recent Labs    12/27/22 0946  INR 1.2  APTT 33    BMP: Recent Labs    12/27/22 0946 12/28/22 0301 12/29/22 0325 12/29/22 1625 12/30/22 0548  NA 135 136 140  --  130*  K 4.1 3.5 2.8* 2.7* 3.4*  CL 100 103 103  --   99  CO2 '24 22 24  '$ --  22  GLUCOSE 160* 136* 118*  --  146*  BUN 26* 18 19  --  16  CALCIUM 8.7* 8.3* 7.9*  --  7.4*  CREATININE 1.14 0.83 0.88  --  0.86  GFRNONAA >60 >60 >60  --  >60    LIVER FUNCTION TESTS: Recent Labs    08/18/22 1418 12/27/22 0946  BILITOT 0.5 0.6  AST 16 34  ALT 13 18  ALKPHOS 66 84  PROT 5.9* 6.2*  ALBUMIN 3.1* 3.2*    TUMOR MARKERS: No results for input(s): "AFPTM", "CEA", "CA199", "CHROMGRNA" in the last 8760 hours.  Assessment and Plan: Acute encephalopathy Patient with new scattered foci of restricted diffusion involving the high left frontal and hight left parietal lobe compatible with acute/subacute nonhemorrhagic infarctions as well as L ICA stenosis, R vertebral artery stenosis.  Also found to be Flu A positive on admission with elevated temp to 101.6.  He is admitted for stroke care as well as management of his SIRS/sepsis.  IR consulted for diagnostic angiogram to assess his intra-cranial stenosis. Case reviewed and approved by Dr. Karenann Cai with anticipation of procedure today.  TFs held at 845am for procedure this afternoon, however patient spiked a fever this AM after several days afebrile.  Ongoing work-up per primary team. Interventional Neuroradiology remains available and will plan to proceed with angiogram on clinically stable.   Risks and benefits of diagnostic angiogram were discussed with the patient's son, Garett Tetzloff, including, but not limited to bleeding, infection, vascular injury or contrast induced renal failure.  This interventional procedure involves the use of X-rays and because of the nature of the planned procedure, it is possible that we will have prolonged use of X-ray fluoroscopy.  Potential radiation risks to you include (but are not limited to) the following: - A slightly elevated risk for cancer  several years later in life. This risk is typically less than 0.5% percent. This risk is low in comparison to  the normal incidence of human cancer, which is 33% for women and  50% for men according to the [place]. - Radiation induced injury can include skin redness, resembling a rash, tissue breakdown / ulcers and hair loss (which can be temporary or permanent).   The likelihood of either of these occurring depends on the difficulty of the procedure and whether you are sensitive to radiation due to previous procedures, disease, or genetic conditions.   IF your procedure requires a prolonged use of radiation, you will be notified and given written instructions for further action.  It is your responsibility to monitor the irradiated area for the 2 weeks following the procedure and to notify your physician if you are concerned that you have suffered a radiation induced injury.    All of the patient's son's questions were answered, he is agreeable to proceed.  Consent signed and in chart.   Thank you for this interesting consult.  I greatly enjoyed meeting [name] and look forward to participating in their care.  A copy of this report was sent to the requesting provider on this date.  Electronically Signed: Docia Barrier, PA 12/30/2022, 10:36 AM   I spent a total of 40 Minutes    in face to face in clinical consultation, greater than 50% of which was counseling/coordinating care for acute encephalopathy, ICA stenosis.

## 2022-12-30 NOTE — TOC Progression Note (Addendum)
Transition of Care [facility]) - Progression Note    Patient Details  Name: VLADIMIR LENHOFF MRN: [id number] Date of Birth: 29-May-1946  Transition of Care [facility]) CM/SW Contact  Pollie Friar, RN Phone Number: 12/30/2022, 1:32 PM  Clinical Narrative:    CM talked with patients son via phone. He states the patient has been staying with his ex-wife and son says he is able to return there at d/c. Pt would need to be ambulatory (not needing physical assist) and not need assist with bathroom use. Son says the patients bathroom is about 20 feet from his bedroom and there are 2 steps to enter the home.  TOC following.   Expected Discharge Plan: [place] Barriers to Discharge: Continued Medical Work up  Expected Discharge Plan and Services   Discharge Planning Services: CM Consult Post Acute Care Choice: [place] arrangements for the past 2 months: Apartment                                       Social Determinants of Health (SDOH) Interventions [place]: No Food Insecurity (12/28/2022)  Housing: Low Risk  (12/28/2022)  Transportation Needs: No Transportation Needs (12/28/2022)  Utilities: Not At Risk (12/28/2022)  Tobacco Use: Medium Risk (12/30/2022)    Readmission Risk Interventions     No data to display

## 2022-12-30 NOTE — Progress Notes (Addendum)
STROKE TEAM PROGRESS NOTE   INTERVAL HISTORY No family is at the bedside.  Patient is lying in bed, appears ill but in no acute distress.  Alert, interactive. Right hand weakness continues, but improved. He is able to extend wrist and can now bend his fingers some.   Patient had fevers overnight, antibiotics per primary.   Patient receives continuous tube feeds at home through his PEG tube, anticipate discharge home with tube feedings, as he has not passed a swallow exam.   Recommend keep SBP >120 to avoid hypotension.  Continue stroke risk factor modification. Dual anti platelet therapy aspirin and plavix x3 months, followed by aspirin alone.  Recommend postpone catheter angiogram and angioplasty stenting due to patient still having fever.  He has had previous follow-up at [facility] and may want this procedure to be done there.  Elective outpatient interventional neuroradiology y consult for carotid revascularization. Neurology will sign off. Please re-consult as needed.    Vitals:   12/30/22 0500 12/30/22 0832 12/30/22 0945 12/30/22 1209  BP:  133/64  (!) 107/58  Pulse:  74  66  Resp:  20  20  Temp:  (!) 102 F (38.9 C) 98.9 F (37.2 C) 98.3 F (36.8 C)  TempSrc:  Oral  Oral  SpO2:  97%  95%  Weight: 76.2 kg     Height:       CBC:  Recent Labs  Lab 12/27/22 0946 12/28/22 0301 12/29/22 0325 12/30/22 0548  WBC 8.2   < > 2.1* 3.9*  NEUTROABS 6.6  --   --   --   HGB 10.3*   < > 8.6* 8.9*  HCT 31.6*   < > 25.7* 26.4*  MCV 88.8   < > 86.8 85.7  PLT 187   < > 153 141*   < > = values in this interval not displayed.    Basic Metabolic Panel:  Recent Labs  Lab 12/29/22 0325 12/29/22 1625 12/30/22 0548  NA 140  --  130*  K 2.8* 2.7* 3.4*  CL 103  --  99  CO2 24  --  22  GLUCOSE 118*  --  146*  BUN 19  --  16  CREATININE 0.88  --  0.86  CALCIUM 7.9*  --  7.4*  MG 1.4* 1.4* 1.9  PHOS 4.2 1.6* 2.2*    Lipid Panel:  Recent Labs  Lab 12/28/22 0301  CHOL 77  TRIG 51   HDL 29*  CHOLHDL 2.7  VLDL 10  LDLCALC NOT CALCULATED    HgbA1c:  Recent Labs  Lab 12/27/22 2028  HGBA1C 6.2*    Urine Drug Screen: No results for input(s): "LABOPIA", "COCAINSCRNUR", "LABBENZ", "AMPHETMU", "THCU", "LABBARB" in the last 168 hours.  Alcohol Level  Recent Labs  Lab 12/27/22 0946  ETH <10     IMAGING past 24 hours No results found.  PHYSICAL EXAM  Physical Exam  Constitutional: Appears ill, but in no acute distress Psych: Affect appropriate to situation, calm and cooperative with exam Cardiovascular: Normal rate and regular rhythm.  Respiratory: Effort normal, non-labored breathing GI: Soft.  No distension. There is no tenderness.  Skin: WDI  Neuro: Mental Status: Patient is awake, alert, oriented to person, place, month, year, and situation. Patient is able to give a clear and coherent history. Speech is minimally dysarthric. No signs of aphasia or neglect Cranial Nerves: II: Visual Fields are full. Pupils are equal, round, and reactive to light.  III,IV, VI: EOMI without ptosis or diploplia.  V: Facial sensation is symmetric to temperature VII: Facial movement is symmetric resting and smiling VIII: Hearing is intact to voice X: Palate elevates symmetrically XI: Shoulder shrug is symmetric. XII: Tongue protrudes midline  Motor: Tone is normal. Bulk is normal.  Able to elevate all extremities antigravity without vertical drift.  Right wrist drop present. Able to extend right wrist and able to flex fingers some.  Sensory: Sensation is symmetric to light touch and temperature in the arms and legs.  Cerebellar: FNF and HKS are intact bilaterally.  ASSESSMENT/PLAN [name] is a 77 y.o. 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 LKW 20:30 on 12/26/22. On presentation, patient was found to have some right arm weakness and was febrile to 101.26F.   Stroke: Scattered acute infarcts involving the high left frontal and parietal lobes likely embolic from cerebral hypoperfusion due to hypotension from sepsis with tandem stenosis of the proximal left ICA and left cavernous carotid artery in the setting of sepsis.  Code Stroke CT head stable and normal for age noncontrast CT appearance of the brain. ASPECTS 10.  CTA head & neck negative for LVO.  Positive for advanced atherosclerosis of the neck and head with complex plaque at the left ICA origin and bulb with estimated 60 to 65% stenosis, up to severe left ICA cavernous segment stenosis due to bulky calcified plaque, up to moderate right ICA siphon stenosis, severe right vertebral artery origin stenosis, up to moderate left vertebral artery stenosis. CT perfusion negative MRI  scattered foci of restricted diffusion involving the high left frontal and hight left parietal lobe compatible with acute/subacute nonhemorrhagic infarctions. More remote cortical infarcts involving the posteromedial left parietal lobe and remote lacunar infarcts of the thalami bilaterally.  Confluent periventricular T2 hyperintensities bilaterally likely reflect the sequela of chronic microvascular ischemia. 2D Echo LVEF 60 to 65% LDL NOT CALCULATED HgbA1c 6.8 VTE prophylaxis - Lovenox    Diet   Diet NPO time specified   No antithrombotic prior to admission, now on aspirin 81 mg daily and clopidogrel 75 mg daily. X 3 months and then aspirin alone Discuss with IR for possible discharge diagnostic angio with possible elective stenting Therapy recommendations:  Home health PT, Outpatient OT Disposition:  pending   Hypertension Home meds:  None Stable Permissive hypertension (OK if < 220/120) but gradually  normalize in 5-7 days Long-term BP goal normotensive  Hyperlipidemia Home meds:  atorvastatin 40 mg PO daily, resumed in hospital LDL NOT CALCULATED, goal < 70 Continue statin at discharge  Diabetes type II Controlled Home meds:  Metformin HgbA1c 6.8, goal < 7.0 CBGs Recent Labs    12/30/22 0427 12/30/22 0847 12/30/22 1205  GLUCAP 144* 141* 111*     SSI  Other Stroke Risk Factors Advanced Age >/= 39  Former Cigarette smoker, stopped smoking 17 years ago ETOH use, alcohol level <10, advised to drink no more than 2 drink(s) a day  Other Active Problems RA on immunosuppression with Orencia, methotrexate, and prednisone  Sepsis with acute encephalopathy Influenza A+ in an immunosuppressed patient  Febrile with Tmax 102 Lactic acidosis, resolved 2.9 > 2.1> 5.5 > 2.8 > 1.1 > 1.9  Hospital day # 3  Neuro to sign off. Please re-consult as needed.   Pt seen by Neuro NP/APP and later by MD. Note/plan to be edited by MD as needed.    Otelia Santee, DNP, AGACNP-BC [facility] Please use AMION for pager and EPIC for messaging I have personally obtained history,examined this patient, reviewed notes, independently viewed imaging studies, participated in medical decision making and plan of care.ROS completed by me personally and pertinent positives fully documented  I have made any additions or clarifications directly to the above note. Agree with note above.  Recommend aspirin and Plavix for 3 months followed by aspirin alone.  Postpone cerebral catheter angiogram/angioplasty given patient still having fever.  This can be done electively as an outpatient.  Patient has had previous care at [facility] and may want to be done there.  Discussed with Dr. Norma Fredrickson neurointerventional radiology.  Follow-up as an outpatient in the stroke clinic in 2 months.  Stroke team will sign off.  Kindly call for questions.  Greater than 50% time during this 35-minute visit was spent in counseling and  coordination of care about his symptomatic carotid stenosis and discussion about plans for revascularization and answering questions.  Antony Contras, MD Medical Director [facility] Pager: [phone] 12/30/2022 4:15 PM  To contact Stroke Continuity provider, please refer to [email or web address] After hours, contact General Neurology

## 2022-12-30 NOTE — Care Management Important Message (Signed)
Important Message  Patient Details  Name: [name] MRN: [id number] Date of Birth: 05/28/1946   Medicare Important Message Given:  Yes Patient has a Contact Precaution order in Place. Called patient room No anwser  Will mail the copy to the patient home address.     Tameia Rafferty 12/30/2022, 3:13 PM

## 2022-12-31 ENCOUNTER — Inpatient Hospital Stay (HOSPITAL_COMMUNITY): Payer: Medicare Other

## 2022-12-31 ENCOUNTER — Other Ambulatory Visit (HOSPITAL_COMMUNITY): Payer: Self-pay

## 2022-12-31 DIAGNOSIS — G459 Transient cerebral ischemic attack, unspecified: Secondary | ICD-10-CM

## 2022-12-31 DIAGNOSIS — J101 Influenza due to other identified influenza virus with other respiratory manifestations: Secondary | ICD-10-CM | POA: Diagnosis not present

## 2022-12-31 DIAGNOSIS — Z87891 Personal history of nicotine dependence: Secondary | ICD-10-CM | POA: Diagnosis not present

## 2022-12-31 DIAGNOSIS — G9340 Encephalopathy, unspecified: Secondary | ICD-10-CM | POA: Diagnosis not present

## 2022-12-31 LAB — GLUCOSE, CAPILLARY
Glucose-Capillary: 141 mg/dL — ABNORMAL HIGH (ref 70–99)
Glucose-Capillary: 146 mg/dL — ABNORMAL HIGH (ref 70–99)
Glucose-Capillary: 153 mg/dL — ABNORMAL HIGH (ref 70–99)
Glucose-Capillary: 106 mg/dL — ABNORMAL HIGH (ref 70–99)
Glucose-Capillary: 110 mg/dL — ABNORMAL HIGH (ref 70–99)
Glucose-Capillary: 101 mg/dL — ABNORMAL HIGH (ref 70–99)
Glucose-Capillary: 121 mg/dL — ABNORMAL HIGH (ref 70–99)

## 2022-12-31 LAB — COMPREHENSIVE METABOLIC PANEL
ALT: 23 U/L (ref 0–44)
AST: 36 U/L (ref 15–41)
Albumin: 2.3 g/dL — ABNORMAL LOW (ref 3.5–5.0)
Alkaline Phosphatase: 50 U/L (ref 38–126)
Anion gap: 8 (ref 5–15)
BUN: 22 mg/dL (ref 8–23)
CO2: 19 mmol/L — ABNORMAL LOW (ref 22–32)
Calcium: 7.6 mg/dL — ABNORMAL LOW (ref 8.9–10.3)
Chloride: 105 mmol/L (ref 98–111)
Creatinine, Ser: 0.79 mg/dL (ref 0.61–1.24)
GFR, Estimated: >60 mL/min (ref >60)
Glucose, Bld: 149 mg/dL — ABNORMAL HIGH (ref 70–99)
Potassium: 4.3 mmol/L (ref 3.5–5.1)
Sodium: 132 mmol/L — ABNORMAL LOW (ref 135–145)
Total Bilirubin: 0.4 mg/dL (ref 0.3–1.2)
Total Protein: 4.9 g/dL — ABNORMAL LOW (ref 6.5–8.1)

## 2022-12-31 LAB — CBC
HCT: 28.7 % — ABNORMAL LOW (ref 39.0–52.0)
Hemoglobin: 9.6 g/dL — ABNORMAL LOW (ref 13.0–17.0)
MCH: 28.7 pg (ref 26.0–34.0)
MCHC: 33.4 g/dL (ref 30.0–36.0)
MCV: 85.7 fL (ref 80.0–100.0)
Platelets: 123 10*3/uL — ABNORMAL LOW (ref 150–400)
RBC: 3.35 MIL/uL — ABNORMAL LOW (ref 4.22–5.81)
RDW: 17.9 % — ABNORMAL HIGH (ref 11.5–15.5)
WBC: 4 10*3/uL (ref 4.0–10.5)
nRBC: 0 % (ref 0.0–0.2)

## 2022-12-31 LAB — MAGNESIUM: Magnesium: 1.7 mg/dL (ref 1.7–2.4)

## 2022-12-31 LAB — PHOSPHORUS: Phosphorus: 2.6 mg/dL (ref 2.5–4.6)

## 2022-12-31 MED ORDER — LACTATED RINGERS IV BOLUS
500.0000 mL | Freq: Once | INTRAVENOUS | Status: AC
  Administered 2023-01-01: 500 mL via INTRAVENOUS

## 2022-12-31 MED ORDER — PREDNISONE 1 MG PO TABS
1.0000 mg | ORAL_TABLET | Freq: Every day | ORAL | 0 refills | Status: DC
  Filled 2022-12-31: qty 14, 14d supply, fill #0

## 2022-12-31 MED ORDER — CLOPIDOGREL BISULFATE 75 MG PO TABS
75.0000 mg | ORAL_TABLET | Freq: Every day | ORAL | 0 refills | Status: DC
  Filled 2022-12-31: qty 30, 30d supply, fill #0

## 2022-12-31 MED ORDER — IOHEXOL 350 MG/ML SOLN
75.0000 mL | Freq: Once | INTRAVENOUS | Status: AC | PRN
  Administered 2022-12-31: 75 mL via INTRAVENOUS

## 2022-12-31 MED ORDER — FEEDING SUPPLIES KIT
1.0000 | PACK | Freq: Every day | 2 refills | Status: DC
  Filled 2022-12-31: qty 1, fill #0

## 2022-12-31 MED ORDER — TICAGRELOR 90 MG PO TABS
90.0000 mg | ORAL_TABLET | Freq: Two times a day (BID) | ORAL | Status: DC
  Administered 2023-01-01 – 2023-01-05 (×8): 90 mg via ORAL
  Filled 2022-12-31 (×9): qty 1

## 2022-12-31 MED ORDER — OSMOLITE 1.5 CAL PO LIQD
1000.0000 mL | ORAL | 2 refills | Status: DC
  Filled 2022-12-31: qty 237, fill #0

## 2022-12-31 MED ORDER — LACTATED RINGERS IV BOLUS
500.0000 mL | Freq: Once | INTRAVENOUS | Status: AC
  Administered 2022-12-31: 500 mL via INTRAVENOUS

## 2022-12-31 MED ORDER — SODIUM CHLORIDE 0.9 % IV SOLN: INTRAVENOUS | Status: DC

## 2022-12-31 MED ORDER — ASPIRIN 81 MG PO CHEW
81.0000 mg | CHEWABLE_TABLET | Freq: Every day | ORAL | 0 refills | Status: DC
  Filled 2022-12-31: qty 30, 30d supply, fill #0

## 2022-12-31 MED ORDER — FREE WATER
200.0000 mL | 7 refills | Status: DC
  Filled 2022-12-31: qty 1200, 1d supply, fill #0

## 2022-12-31 MED ORDER — LEVOTHYROXINE SODIUM 50 MCG PO TABS
50.0000 ug | ORAL_TABLET | Freq: Every day | ORAL | Status: DC
  Administered 2022-12-31 – 2023-01-06 (×6): 50 ug
  Filled 2022-12-31 (×5): qty 1
  Filled 2022-12-31: qty 2

## 2022-12-31 MED ORDER — OSMOLITE 1.5 CAL PO LIQD
1000.0000 mL | ORAL | Status: DC
  Administered 2022-12-31 – 2023-01-01 (×2): 1000 mL

## 2022-12-31 MED ORDER — TICAGRELOR 90 MG PO TABS
180.0000 mg | ORAL_TABLET | Freq: Once | ORAL | Status: AC
  Administered 2023-01-01: 180 mg via ORAL
  Filled 2022-12-31: qty 2

## 2022-12-31 NOTE — Progress Notes (Signed)
Subjective:   Patient is feeling "so so" today. Denies any new complaints. Discussed plan for likely discharge following results of repeat blood cultures. Discussed plan for outpatient angiogram.   Objective:  Vital signs in last 24 hours: Vitals:   12/30/22 1209 12/30/22 1719 12/30/22 2325 12/31/22 0306  BP: (!) 107/58 (!) 102/51 (!) 118/51 (!) 109/57  Pulse: 66 62 67 70  Resp: 20 18    Temp: 98.3 F (36.8 C) 99 F (37.2 C) 99.2 F (37.3 C) 98.2 F (36.8 C)  TempSrc: Oral Oral Oral Oral  SpO2: 95% 97% 97% 96%  Weight:      Height:       Physical Exam: General: Alert, appears mildly uncomfortable HEENT: Normocephalic, atraumatic CV: RRR, no murmurs, rubs, or gallops Pulm: Normal respiratory effort. Intermittent cough. No wheezes or crackles.  Skin: Warm and dry Ext: No lower extremity edema Neuro: 1/5 grip strength on R hand, minimal mobility of the R wrist, otherwise normal strength  Assessment/Plan:  Principal Problem:   Acute encephalopathy Active Problems:   Sepsis without acute organ dysfunction ([place])   Influenza A   Cerebrovascular accident (CVA) ([place])   Protein-calorie malnutrition, severe  [name] is a 77 year old man with medical history significant for PMH of RA, ILD, Hx of DVT/PE, T2DM, HTN, HLD, Esophageal outlet obstructions s/p multiple dilatations and GJ tube placement for FTT, severe protein calorie malnutrition, AAA L [place] artery aneurysm s/p EVAR admitted for acute encephalopathy and CVA.     Sepsis with Acute Encephalopathy Recent hx of pneumonia Influenza A Febrile yesterday morning. Given tylenol. No fevers overnight. Initial blood cultures negative at 3 days. Cefepime discontinued today following negative repeat blood cultures. Suspect recent fever was likely secondary to influenza.  -F/u repeat blood culture -Continue cefepime -Continue Tamiflu x5 days total (day 5)    Left MCA CVA  Carotid U/S showed 40-59% stenosis in left  ICA, 1-39% stenosis in right ICA. Neuro recommended IR evaluation for possible diagnostic angio/elective stenting. Neuro IR was consulted with plan for diagnostic angiogram, however, procedure cancelled given new onset fever and repeat blood culture orders. Neuro IR and neuro comfortable w/ diagnostic angiogram as outpatient. Neuro plan for aspirin + plavix for 3 months followed by aspirin alone upon discharge.  -Continue ASA '81mg'$  -Continue clopidogrel '75mg'$  -Atorvastatin 40 mg daily  -PT/OT/SLP -Neuro checks -Telemetry   Malnutrition Hypokalemia - resolved Hypomagnesemia - resolved Hypophosphatemia - resolved  On continuous tube feeds at home per family with interruptions in his feeding for medical visits. K, Mg, and phos have normalized yesterday evening. Will continue w/ continuous tube feeds and free H20 flushes. RD consulted and patient will go home on osmolite 1.5 at 80 ml/h x 18 hours and 200 mL free water q4 hours. -Osmolite 1000 mL (60 mL/hr) per tube -Prosouce 60 mL daily per tube -Free water flushes 200 mL q4 hours per tube   T2DM  A1c 6.2%. Taking metformin at home. CBGs in the low-mid 100s over the last 24 hours.   -Trend CBGs  Hypothyroidism Restart patients home levothyroxine. -Levothyroxine 50 mcg daily, per tube   Chronic Steroids  Patient will need to f/u with his rheumatologist upon discharge.  -Prednisone '1mg'$  daily, per tube  Esophageal Outlet Obstruction with PEG tube  Plan for patient to follow up with outpatient GI for further workup if needed.      Dispo  Patient will go home with ex-wife who is in agreement. OT recommends outpatient  OT PRN w/o equipment. PT recommends HH PT w/ rolling walker. SLP recommends NPO and outpatient SLP. Patient appeared to be doing worse with OT today. Will hold off on discharge today and reassess needs for tomorrow.    Diet: Tube feeds Bowel: none VTE: SCDs  IVF: None Code: Full   Prior to Admission Living Arrangement: at  home w/ ex-wife  Anticipated Discharge Location: TBD Barriers to Discharge: continued management Dispo: Anticipated discharge in approximately less than 2 day(s).  Starlyn Skeans, MD 12/31/2022, 6:22 AM Pager: [phone] After 5pm on weekdays and 1pm on weekends: On Call pager [phone]

## 2022-12-31 NOTE — Progress Notes (Signed)
Visited patient at bedside around 23:10 to reassess neurological deficits following possible stroke.  Upon arrival, vitals notable for BP 96/49. Patient was oriented to person and general hospital setting, but neither year nor city. Strength 5/5 throughout LUE and BLEs, though somewhat limited by patient cooperation. Examination of RUE was consistent with prior examination performed around 18:00, elbow flexion-extension strength about 3/5 and grip 0-1/5. Sensation to light touch intact across RUE.  One bolus of 542m LR was ordered for hypotension. Primary team will consider brain MRI if orientation fails to improve over the next 6-8 hours.

## 2022-12-31 NOTE — Progress Notes (Signed)
Physical Therapy Treatment Patient Details Name: [name] MRN: [id number] DOB: 12-Aug-1946 Today's Date: 12/31/2022   History of Present Illness [name] is a 77 y.o. male who presents after being found on floor unable to talk and get up.  MRI positive for scattered foci of restricted diffusion involving the high left frontal lobe and high left parietal lobe compatible with acute/subacute nonhemorrhagic infarcts.  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  He also tested positive for the flu A and possible aspiration PNA.  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 Upon start of bed mobility, noted pt bed to be soiled and pt incontinent of bowels. Pt able to come to sitting EOB with min assist to elevate trunk. With encouragement pt able to come to stand with min assist to power up and steady on rise, pt sitting back down after ~10 seconds due to fatigue, stating he cannot try again and declining ambulation or set up at sink for wash up. Pt lying back down with supervision and needing min assist to maintain sidelying for peri-care and linen change. Pt endorsing not feeling well throughout session, however unable to elaborate despite prompts. Current plan remains appropriate to address deficits and maximize functional independence and decrease caregiver burden. Pt continues to benefit from skilled PT services to progress toward functional mobility goals.    Recommendations for follow up therapy are one component of a multi-disciplinary discharge planning process, led by the attending physician.  Recommendations may be updated based on patient status, additional functional criteria and insurance  authorization.  Follow Up Recommendations  Home health PT     Assistance Recommended at Discharge Intermittent Supervision/Assistance  Patient can return home with the following A little help with walking and/or transfers;A little help with bathing/dressing/bathroom;Assistance with cooking/housework;Assist for transportation;Help with stairs or ramp for entrance   Equipment Recommendations  Rolling walker (2 wheels)    Recommendations for Other Services       Precautions / Restrictions Precautions Precautions: Fall Precaution Comments: peg tube, flu + Restrictions Weight Bearing Restrictions: No     Mobility  Bed Mobility Overal bed mobility: Needs Assistance Bed Mobility: Rolling, Sit to Supine, Supine to Sit Rolling: Min assist   Supine to sit: Min assist Sit to supine: Supervision   General bed mobility comments: increased time and effort and use of bedrails, min assist to elevate trunk, min assist to maintain sidelying Patient Response: Flat affect  Transfers Overall transfer level: Needs assistance Equipment used: None Transfers: Sit to/from Stand Sit to Stand: Min assist           General transfer comment: stood from EOB x1 with min assist to power up and steady on rise, pt maintaining ~10 secnods then sitting back down, pt stating too tired to try again or ambulate    Ambulation/Gait                   Stairs             Wheelchair Mobility    Modified Rankin (Stroke Patients Only)       Balance Overall balance assessment: Needs assistance Sitting-balance support: Feet supported Sitting balance-Leahy Scale: Fair Sitting balance - Comments: able to  maintain static sitting balance with distant supervision   Standing balance support: Single extremity supported (IV pole) Standing balance-Leahy Scale: Fair Standing balance comment: able to maintain static standing balance with close supervision but required min guard for dynamic  standing balance                            Cognition Arousal/Alertness: Lethargic Behavior During Therapy: Flat affect Overall Cognitive Status: Within Functional Limits for tasks assessed                                 General Comments: noted occasional difficulty problem solving. pt stating he does not feel good, but unable to elaborate        Exercises      General Comments General comments (skin integrity, edema, etc.): pt endorsing not feeling good today, however unable to elaborate      Pertinent Vitals/Pain Pain Assessment Pain Assessment: No/denies pain Pain Intervention(s): Monitored during session    Home Living                          Prior Function            PT Goals (current goals can now be found in the care plan section) Acute Rehab PT Goals PT Goal Formulation: With patient Time For Goal Achievement: 01/11/23    Frequency    Min 4X/week      PT Plan Current plan remains appropriate    Co-evaluation              AM-PAC PT "6 Clicks" Mobility   Outcome Measure  Help needed turning from your back to your side while in a flat bed without using bedrails?: A Little Help needed moving from lying on your back to sitting on the side of a flat bed without using bedrails?: A Little Help needed moving to and from a bed to a chair (including a wheelchair)?: A Little Help needed standing up from a chair using your arms (e.g., wheelchair or bedside chair)?: A Little Help needed to walk in hospital room?: A Little Help needed climbing 3-5 steps with a railing? : A Lot 6 Click Score: 17    End of Session   Activity Tolerance: Patient limited by fatigue Patient left: in bed;with call bell/phone within reach;with bed alarm set Nurse Communication: Mobility status PT Visit Diagnosis: Unsteadiness on feet (R26.81);Muscle weakness (generalized) (M62.81);Difficulty in walking, not elsewhere classified (R26.2)      Time: 8921-1941 PT Time Calculation (min) (ACUTE ONLY): 23 min  Charges:  $Therapeutic Activity: 23-37 mins                     Jurrell Royster R. PTA Acute Rehabilitation Services Office: [place] 12/31/2022, 10:09 AM

## 2022-12-31 NOTE — Progress Notes (Signed)
Occupational Therapy Treatment Patient Details Name: [name] MRN: [id number] DOB: 17-Feb-1946 Today's Date: 12/31/2022   History of present illness [name] is a 77 y.o. male who presents after being found on floor unable to talk and get up.  MRI positive for scattered foci of restricted diffusion involving the high left frontal lobe and high left parietal lobe compatible with acute/subacute nonhemorrhagic infarcts.  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  He also tested positive for the flu A and possible aspiration PNA.  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 Upon OT entry, patient noted to be fatigued, have more expressive difficulties than noted on OT evaluation, and reporting "I dont feel proper" though denying pain. Patient requiring mod A to come to EOB, however unable to maintain sitting position for longer than 30 seconds due to fatigue and patient continuing to state, "I dont feel proper". RN alerted and case management contacted with regard to level of decline as patient's family can only provide stand by assist. OT recommendation changed to [facility] for patient, however if decline continues, patient may require short term SNF placement due to current level of needs.    Recommendations for follow up therapy are one component of a multi-disciplinary discharge planning process, led by the attending physician.  Recommendations may be updated based on patient status, additional functional criteria and insurance authorization.    Follow Up Recommendations  Home health OT     Assistance Recommended at Discharge Frequent or constant Supervision/Assistance  Patient can return home with  the following  A lot of help with walking and/or transfers;A lot of help with bathing/dressing/bathroom;Assistance with cooking/housework;Direct supervision/assist for medications management;Direct supervision/assist for financial management;Assist for transportation;Help with stairs or ramp for entrance   Equipment Recommendations  None recommended by OT    Recommendations for Other Services      Precautions / Restrictions Precautions Precautions: Fall Precaution Comments: peg tube, flu + Restrictions Weight Bearing Restrictions: No       Mobility Bed Mobility Overal bed mobility: Needs Assistance Bed Mobility: Supine to Sit, Sit to Supine     Supine to sit: Mod assist Sit to supine: Min assist   General bed mobility comments: increased time and effort and use of bedrails, mod A to come into sitting on second attempt (could not complete with use of bed rails only, requiring OT intervention) unable to maintain sitting position for longer than 30 seconds    Transfers                   General transfer comment: declined due to "not feeling proper"     Balance Overall balance assessment: Needs assistance Sitting-balance support: Feet supported Sitting balance-Leahy Scale: Fair Sitting balance - Comments: did not maintain sitting balance for longer than 30 seconds                                   ADL either performed or assessed with clinical judgement   ADL Overall ADL's : Needs assistance/impaired     Grooming: Wash/dry hands;Wash/dry face;Moderate assistance Grooming Details (indicate cue type and reason): simulated  Functional mobility during ADLs: Moderate assistance;Cueing for sequencing;Cueing for safety General ADL Comments: Patient reporting "I dont feel proper" declining all ADLs, unable to express what is bothering him, mod A to come to EOB with difficulty    Extremity/Trunk Assessment               Vision       Perception     Praxis      Cognition Arousal/Alertness: Lethargic Behavior During Therapy: Flat affect Overall Cognitive Status: Difficult to assess                                 General Comments: noted expressive difficulties, "I dont feel proper" but oriented, could not elaborate when asked, and noted frustration when OT attempting to understand        Exercises      Shoulder Instructions       General Comments      Pertinent Vitals/ Pain       Pain Assessment Pain Assessment: Faces Faces Pain Scale: Hurts little more Pain Location: generalized Pain Descriptors / Indicators: Discomfort, Grimacing, Guarding Pain Intervention(s): Limited activity within patient's tolerance, Monitored during session, Repositioned  Home Living                                          Prior Functioning/Environment              Frequency  Min 2X/week        Progress Toward Goals  OT Goals(current goals can now be found in the care plan section)  Progress towards OT goals: Not progressing toward goals - comment (decreased ability in session in comparision to previous OT encounters)  Acute Rehab OT Goals Patient Stated Goal: to feel better OT Goal Formulation: With patient Time For Goal Achievement: 01/11/23 Potential to Achieve Goals: [place] Discharge plan needs to be updated    Co-evaluation                 AM-PAC OT "6 Clicks" Daily Activity     Outcome Measure   Help from another person eating meals?: A Little Help from another person taking care of personal grooming?: A Little Help from another person toileting, which includes using toliet, bedpan, or urinal?: A Lot Help from another person bathing (including washing, rinsing, drying)?: A Lot Help from another person to put on and taking off regular upper body clothing?: A Lot Help from another person to put on and taking off regular lower body  clothing?: A Lot 6 Click Score: 14    End of Session    OT Visit Diagnosis: Unsteadiness on feet (R26.81);Muscle weakness (generalized) (M62.81);Pain Pain - Right/Left: Left Pain - part of body: Hand   Activity Tolerance Patient limited by fatigue;Patient limited by lethargy   Patient Left in bed;with call bell/phone within reach;with bed alarm set   Nurse Communication Mobility status;Other (comment) (generalized weakness)        Time: 1610-9604 OT Time Calculation (min): 8 min  Charges: OT General Charges $OT Visit: 1 Visit OT Treatments $Self Care/Home Management : 8-22 mins  Corinne Ports E. Erron Wengert, OTR/L Acute Rehabilitation Services [phone]   Ascencion Dike 12/31/2022, 2:49 PM

## 2022-12-31 NOTE — Progress Notes (Addendum)
Notified by Neurology at 5:50PM of stroke recommendations following code stroke that was called 4:45PM. Primary team was not notified of events until after the fact.  Per discussion with nurses and review of chart, patient was working with physical therapy at about 3PM, however, he became hypotensive during this with no change in his examination. Patient was placed in supine position with improvement in his BP. PT returned to check on patient shortly before 4:30PM. It was noted at that time that he had marked right-sided weakness and aphasia far greater than the right hand weakness that he presented with. Code stroke was activated.   Patient not able to recall events from earlier today. Aware that he "does not have all his faculties"  Patient seen and evaluated at bedside.  Patient awake, alert. Oriented to self. Not oriented to place, time, or situation. Some expressive aphasia.  EOM intact, muscles of facial expression intact. Slight tongue deviation right. CNXI intact. Flexion and extension of shoulder and R elbow 4+/5. Wrist extension 1-2/5. Grip strength 1/5. LUE 5/5 throughout. Lower extremities 5/5 throughout and symmetric.   Neurologic symptoms quickly improving after fluid bolus. CT head negative for acute findings and CTA head and neck without change of his known advanced atherosclerosis.  Suspect that this is recrudescence of stroke symptoms from admission in the setting of witnessed hypotension and known carotid artery stenosis. This is supported by improvement of symptoms with fluid bolus and improvement in his hypotension. It is also possible that there was TIA from ulcerated plaque, however, similarities in his presenting symptoms make me less concerned for this.   Discussed with neurology who recommends patient undergo carotid stenting this admission given recurrence of his symptoms. This will likely be done Monday. We will transition to ticagrelor in preparation for stent.  Diastolic  BP on lower end, will plan to bolus additional 500cc. It will be important that patient maintain adequate cerebral perfusion. If patient develops recurrent stroke symptoms, would bolus additional 1000cc and contact stroke team.  Neuro checks. Vital checks. If patient does not return to previous baseline, will need to get MRI.   ADDENDUM: Discussed with pharmacy, will plan to start loading dose Ticagrelor '180mg'$  tomorrow 8am followed by '90mg'$  BID starting 8pm.

## 2022-12-31 NOTE — Progress Notes (Addendum)
Neurology Emergent Evaluation  Reason for Consult: Left sided weakness Requesting Physician: Lottie Mussel    Per nurse at bedside, he was in his usual state of health today and plan for discharge.  When he worked with physical therapy finishing at about 3 PM however he did become hypotensive but had no change in his examination.  His blood pressure improved with laying down.  When she went to check on him shortly before 4:30 p.m. he had marked right-sided weakness and aphasia far greater than the right hand weakness he had presented with this admission.  Code stroke was activated  LKW: 3 PM Thrombolytic given?: No, recent stroke IA performed?: No, no new LVO  Premorbid modified rankin scale:      1 - No significant disability. Able to carry out all usual activities, despite some symptoms.   Exam: Current vital signs: BP (!) 105/54 (BP Location: Left Arm)   Pulse 72   Temp 99.6 F (37.6 C) (Oral)   Resp 15   Ht '6\' 1"'$  (1.854 m)   Wt 76.2 kg   SpO2 99%   BMI 22.16 kg/m  Vital signs in last 24 hours: Temp:  [98.2 F (36.8 C)-100.8 F (38.2 C)] 99.6 F (37.6 C) (01/05 1632) Pulse Rate:  [67-81] 72 (01/05 1650) Resp:  [15-19] 15 (01/05 1632) BP: (99-118)/(50-58) 105/54 (01/05 1650) SpO2:  [94 %-99 %] 99 % (01/05 1650)  General: No acute distress, affect overall pleasant and cooperative but sometimes slightly irritable  NIHSS total 6 Score breakdown: 2 points for not answering month or age correctly, 1 point for slightly facial droop, 1 point for right upper extremity drift, 1 point for mild aphasia, 1 point for mild dysarthria Performed at 5 PM   I have reviewed labs in epic and the results pertinent to this consultation are:  Basic Metabolic Panel: Recent Labs  Lab 12/27/22 0946 12/28/22 0301 12/29/22 0325 12/29/22 1625 12/30/22 0548 12/30/22 1640 12/31/22 0526  NA 135 136 140  --  130*  --  132*  K 4.1 3.5 2.8* 2.7* 3.4* 3.9 4.3  CL 100 103 103  --  99  --  105   CO2 '24 22 24  '$ --  22  --  19*  GLUCOSE 160* 136* 118*  --  146*  --  149*  BUN 26* 18 19  --  16  --  22  CREATININE 1.14 0.83 0.88  --  0.86  --  0.79  CALCIUM 8.7* 8.3* 7.9*  --  7.4*  --  7.6*  MG  --   --  1.4* 1.4* 1.9 2.1 1.7  PHOS  --   --  4.2 1.6* 2.2* 2.8 2.6     CBC: Recent Labs  Lab 12/27/22 0946 12/28/22 0301 12/29/22 0325 12/30/22 0548 12/31/22 0739  WBC 8.2 3.8* 2.1* 3.9* 4.0  NEUTROABS 6.6  --   --   --   --   HGB 10.3* 9.6* 8.6* 8.9* 9.6*  HCT 31.6* 30.6* 25.7* 26.4* 28.7*  MCV 88.8 90.5 86.8 85.7 85.7  PLT 187 168 153 141* 123*     Coagulation Studies: No results for input(s): "LABPROT", "INR" in the last 72 hours.    I have reviewed the images obtained:  Head CT personally reviewed, agree with radiology no acute intracranial process  CTA personally reviewed, agree with radiology no interval change   Impression: Initially suspected symptomatic hypoperfusion, however given the patient's symptoms occurred while he was laying flat and after  his blood pressure had recovered with no symptoms while he was relatively hypotensive, I am concerned that this represents further stroke/TIA secondary to ulcerated plaque in his left carotid artery.  This was originally planned for outpatient follow-up given his fever and sepsis, but given he was otherwise medically planned for discharge today but is having further neurological symptoms, he should remain in the hospital for planned carotid intervention on Monday  Code stroke Recommendations: - STAT head CT - CTA head/neck  Additional recommendations: -Appreciate Dr. Karenann Cai plan for carotid intervention Monday -Transition to dual antiplatelet therapy with ticagrelor 180 mg loading dose, followed by 90 mg twice daily, stop Plavix; recommend pharmacy consult to confirm timing of transition -MRI brain without contrast if patient does not return fully to recent baseline -If patient has further left MCA  symptoms, reactivate code stroke for potential thrombectomy/emergent stenting, lay flat and bolus with fluids if not otherwise contraindicated -Neurology will be available as needed going forward, please do reach out if new questions or concerns arise  Discussed with Dr. Karenann Cai in person, and primary team via secure chat  Lesleigh Noe MD-PhD [facility] [phone] Available 7 AM to 7 PM, outside these hours please contact Neurologist on call listed on AMION   Total critical care time: 30 minutes   Critical care time was exclusive of separately billable procedures and treating other patients.   Critical care was necessary to treat or prevent imminent or life-threatening deterioration.   Critical care was time spent personally by me on the following activities: development of treatment plan with patient and/or surrogate as well as nursing, discussions with consultants/primary team, evaluation of patient's response to treatment, examination of patient, obtaining history from patient or surrogate, ordering and performing treatments and interventions, ordering and review of laboratory studies, ordering and review of radiographic studies, and re-evaluation of patient's condition as needed, as documented above.

## 2022-12-31 NOTE — Progress Notes (Signed)
Patient in room confused and unable to answer questions. Patient finding it difficult to follow commands. Patient's NIHSS is a 6, called a code stroke. Refer to code documentation.

## 2022-12-31 NOTE — TOC Transition Note (Signed)
Discharge medications (3) are being stored in the main pharmacy on the ground floor until patient is ready for discharge.   

## 2022-12-31 NOTE — Discharge Summary (Signed)
Name: [name] MRN: [id number] DOB: [birth year] 77 y.o. PCP: Elaina Pattee, MD  Date of Admission: 12/27/2022  9:33 AM Date of Discharge: 12/31/2022 Attending Physician: Lottie Mussel, MD  Discharge Diagnosis: 1. Principal Problem:   Acute encephalopathy Active Problems:   Sepsis without acute organ dysfunction (HCC)   Influenza A   Cerebrovascular accident (CVA) ([place])   Protein-calorie malnutrition, severe   Discharge Medications: Allergies as of 12/31/2022   No Known Allergies   Med Rec must be completed prior to using this [facility]***       Disposition and follow-up:   Mr.[name] was discharged from [facility] in Good condition.  At the hospital follow up visit please address:    1. CVA -Patient needs to follow up with IR outpatient to discuss if diagnostic angiogram is needed for possible stent placement -Patient needs to follow up with outpatient OT PRN w/o equipment, home health PT w/ rolling walker, SLP recommends NPO and outpatient SLP.   2. Severe Malnutrition -Patient has chronic malnutrition with electrolyte derangements during admission, patient needs to follow up with GI outpatient to evaluate nutritional needs.   3. Chronic Steroid Use -Patient was previously on a prednisone taper but unsure  restarted on prednisone '1mg'$  during admission  4. Esophageal outlet obstruction with PEG tube -PCCM recommended barium esophogram, patient to follow up with GI outpatient.   5.  Labs / imaging needed at time of follow-up: None  6.  Pending labs/ test needing follow-up: Blood cultures  Follow-up Appointments:  IR for angio*** [facility]?  [facility]  Hospital Course by problem list:  [name] is a 77 y.o. person living with a PMH of RA, ILD, Hx of DVT/PE, T2DM, HTN, HLD, Esophageal outlet obstructions s/p multiple dilatations and GJ tube placement for FTT, severe protein calorie malnutrition, AAA L  Illiac artery aneurysm s/p EVAR who presented with AMS and RUE weakness and admitted for acute encephalopathy and CVA.  Sepsis with Acute Encephalopathy Recent hx of pneumonia Influenza A Patient presented to the ED with altered mental status and weakness after being found on the floor at home by his ex-wife, patient was treated for pneumonia a few days prior. In the ED patient was febrile to 102.8, tachycardic and tachypneic. Lactic acid 2.9. Normal WBC. Patient started on broad spectrum antibiotics. On admission he became hypotensive with MAPs 50-60 which improved with IV fluid resuscitation. Respiratory panel positive for Flu A. Patient was started on Tamiflu x5 days, vancomycin and cefepime. MRSA nasal swab, urine culture, and blood cultures were negative. Patient had one episode of fever on hospital day 4, likely in setting of influenza/pneumonia and obtained blood cultures to r/o bacteremia. Patient had no further episodes of fever and antibiotics were discontinued, patient finished course of Tamiflu.   Left MCA CVA  Patient presented with RUE weakness and dysarthria. MRI brain showed punctate acute strokes of left frontal and parietal along with remote lacunar infarcts of thalami bilaterally. Patient started on Plavix and ASA. Neuro and IR were planning for diagnostic angiogram but cancelled procedure for new fever, and recommended patient follow up and schedule procedure as outpatient.    Malnutrition  Patient has a history of severe protein calorie malnutrition and was on continuous tube feeds at home. Patient's tube feeds were initially held on admission and was restarted on continuous feeds when patient became stable. Patient had hypophosphatemia, hypomagnesemia, and hypokalemia during this admission which corrected with repletion. Patient  discharged with continuous feeding schedule over 18hrs.   T2DM  Patient has history of T2DM on metformin, last A1c 6.4% from 8/23. Glucose remained  well-controlled and metformin was held.   Chronic Steroid Use Patient has been on chronic prednisone for RA on taper prior to admission, per prior hospital admit notes patient was on '2mg'$  prednisone taper started in mid-November to '1mg'$  starting beginning of December x4 weeks. Steroid taper should have been completed upon present admission, but restarted Prednisone '1mg'$  as patient's family stated he was still taking prednisone prior to admission.   Esophageal outlet obstruction with PEG tube  PCCM recommended barium esophogram, patient to follow up with GI outpatient.    Discharge Exam:   BP (!) 99/55 (BP Location: Left Arm)   Pulse 81   Temp 99.1 F (37.3 C) (Oral)   Resp 18   Ht '6\' 1"'$  (1.854 m)   Wt 76.2 kg   SpO2 94%   BMI 22.16 kg/m   Physical Exam General: Alert, chronically-ill appearing, in no acute distress CV: RRR, no murmurs, rubs, or gallops.  Pulm: Normal respiratory effort. Intermittent cough. Coarse crackles in mid and lower left lungs.  Skin: Warm and dry.  Ext: No LE edema. SCDs in place.  Neuro: A&O x4.  Psych: Normal mood and affect   Pertinent Labs, Studies, and Procedures:      Latest Ref Rng & Units 12/31/2022    7:39 AM 12/30/2022    5:48 AM 12/29/2022    3:25 AM  CBC  WBC 4.0 - 10.5 K/uL 4.0  3.9  2.1   Hemoglobin 13.0 - 17.0 g/dL 9.6  8.9  8.6   Hematocrit 39.0 - 52.0 % 28.7  26.4  25.7   Platelets 150 - 400 K/uL 123  141  153        Latest Ref Rng & Units 12/31/2022    5:26 AM 12/30/2022    4:40 PM 12/30/2022    5:48 AM  BMP  Glucose 70 - 99 mg/dL 149   146   BUN 8 - 23 mg/dL 22   16   Creatinine 0.61 - 1.24 mg/dL 0.79   0.86   Sodium 135 - 145 mmol/L 132   130   Potassium 3.5 - 5.1 mmol/L 4.3  3.9  3.4   Chloride 98 - 111 mmol/L 105   99   CO2 22 - 32 mmol/L 19   22   Calcium 8.9 - 10.3 mg/dL 7.6   7.4       Latest Ref Rng & Units 12/31/2022    5:26 AM 12/30/2022    4:40 PM 12/30/2022    5:48 AM  CMP  Glucose 70 - 99 mg/dL 149   146   BUN 8 - 23  mg/dL 22   16   Creatinine 0.61 - 1.24 mg/dL 0.79   0.86   Sodium 135 - 145 mmol/L 132   130   Potassium 3.5 - 5.1 mmol/L 4.3  3.9  3.4   Chloride 98 - 111 mmol/L 105   99   CO2 22 - 32 mmol/L 19   22   Calcium 8.9 - 10.3 mg/dL 7.6   7.4   Total Protein 6.5 - 8.1 g/dL 4.9     Total Bilirubin 0.3 - 1.2 mg/dL 0.4     Alkaline Phos 38 - 126 U/L 50     AST 15 - 41 U/L 36     ALT 0 - 44 U/L 23  Discharge Instructions:  ***  Signed: Spero Curb, Medical Student 12/31/2022, 12:47 PM   Pager: [phone]

## 2022-12-31 NOTE — Progress Notes (Signed)
Mobility Specialist: Progress Note   12/31/22 1510  Mobility  Activity Dangled on edge of bed  Level of Assistance Moderate assist, patient does 50-74%  Assistive Device None  Activity Response Tolerated poorly  $Mobility charge 1 Mobility   Pre-Mobility:     Supine: 76 HR, 99/70 (81) BP, 97% SpO2    Sitting EOB: 73 HR, 91/55 (69) BP, 100% SpO2  Pt received in the bed, reluctant but agreeable to mobility. Bowel incontinent and assisted with pericare with help from RN. ModA with bed mobility d/t feeling weak and dizzy. Pt stating he felt like the room was spinning and "I feel disoriented." Attempted to collect orthostatics but unable to complete as pt requesting to lay back down while sitting EOB. Pt assisted back to bed with help from NT. Pt has call bell and phone at his side. Bed alarm is on.   Belcher Kieran Nachtigal Mobility Specialist Please contact via SecureChat or Rehab office at [phone]

## 2022-12-31 NOTE — Code Documentation (Signed)
Mr [name] is a 77 yr old male with a past history of aortic aneurysm, DM2, HLD, GERD, hiatal hernia, HTN, RA, oral cancer. He is currently on 3 [place] 02 for scattered infarcts r/t hypotension and known L ICA stenosis. He was last known well (baseline of alert, rt arm weakness) at 1500. His nurse checked him at around 1640 and he was confused and weaker than usual. Code stroke was activated.  Pt is on DAPT and Lovenox.    Pt to CT with team. VS, CBG checked in room. Fluid bolus started. Initial NIHSS at 6 (LOC questions, Rt arm no effort against gravity, rt leg drift.) CTNC neg for acute hemorrhage per Dr Curly Shores. CTA performed. Per Dr Curly Shores, no emergent endovascular procedure needed at this time. Pt case discussed with Dr Karenann Cai.    Pt returned to his room with RN. He is now brighter, still unable to answer orientation questions, and rt arm drift (NIHSS 3). He will need q 2 hr VS and NIHSS for 12 hr. Then routine. Avoid hypotension. RN to contact Neurologist in case of hypotension. Bedside handoff with 3 [place] RN complete.

## 2022-12-31 NOTE — TOC Progression Note (Signed)
Transition of Care [facility]) - Progression Note    Patient Details  Name: [name] MRN: [id number] Date of Birth: 1946/09/17  Transition of Care [facility]) CM/SW Contact  Pollie Friar, RN Phone Number: 12/31/2022, 3:34 PM  Clinical Narrative:    Pt too weak for d/c home today. He will need to be strong enough to manage his ADL's mostly independently.  TOC following for potential SNF rehab needs.    Expected Discharge Plan: [place] Barriers to Discharge: Continued Medical Work up  Expected Discharge Plan and Services   Discharge Planning Services: CM Consult Post Acute Care Choice: [place] arrangements for the past 2 months: Apartment Expected Discharge Date: 12/31/22                                     Social Determinants of Health (SDOH) Interventions [place]: No Food Insecurity (12/28/2022)  Housing: Low Risk  (12/28/2022)  Transportation Needs: No Transportation Needs (12/28/2022)  Utilities: Not At Risk (12/28/2022)  Tobacco Use: Medium Risk (12/30/2022)    Readmission Risk Interventions     No data to display

## 2022-12-31 NOTE — Progress Notes (Addendum)
Brief Nutrition Support Note  Pt stable for discharge home. MD reports that wife confirmed that pt was on a continuous feeding scheduled at home (versus bolus). Currently tolerating his regimen at 81m/h. Will adjust rate slightly to allow for pt to still receive the recommend volume of nutrition but to have a few hours away from the pump at home.   Recommend the following home regimen: Tube feeding via GJ-tube: Osmolite 1.5 at 80 ml/h (1440 ml per day) x 18 hours 2076mof free water 6x/d,  Provides 2160 kcal, 90 gm protein, 1097 ml free water daily (229444mf water with flushes)  Estimated Home Nutritional Needs:  Kcal:  2000-2200 Protein:  90-105g/d Fluid:  >2 L/day  RacRanell PatrickD, LDN Clinical Dietitian RD pager # available in AMION  After hours/weekend pager # available [facility]

## 2022-12-31 NOTE — Discharge Instructions (Addendum)
You were hospitalized for altered mental status and a stroke.  Hospital Course: We gave you antibiotics initially for suspected bacteria in your blood.  We discontinued these antibiotics once we confirm there was no bacteria in your blood.  We gave you Tamiflu for influenza.  We suspect that influenza was related to your altered mental status.  We started you on aspirin and Ticagrelor after your stroke to decrease the risk of future strokes.  You will continue taking atorvastatin which will help to decrease the risk of future stroke.  We provided you with nutrition per tube for your malnutrition. You will receive physical therapy while at acute inpatient rehab. We also ordered you a walker for home. You will continue continuous feeds per tube at home (osmolite at 85 mL/hr for 18 hours per day), Prosource (60 mL per day), and free water flushes (200 mL every 4 hours).   Medications:  Please start taking: -Aspirin 81 mg daily indefinitely  -Ticagrelor 90 mg twice daily (for 3 months) -Prednisone 10 mg (take 10 mg once daily for 5 days then take 5 mg (1/2 tablet) once daily until you see your Rheumatologist)  Please change how your are taking:  Please stop taking: -Prednisone 1 mg daily -Lovenox injections  Please continue taking: -Abatacept 250 mg injection -Maalox -Atorvastatin 40 mg daily -Voltaren 1% gel -Pepcid 20 mg tablet daily -Ferrous sulfate 300 mg syrup daily -Folic acid 1 mg tablet daily -Levothyroxine 50 mcg daily -Lidocaine 2% solution -Magnesium oxide 240 mg tablet daily -Methotrexate 25 mg injection once weekly -Metformin 500 mg daily -Zofran 4 mg  -Oxycodone 5 mg -Pantoprazole 40 mg tablet twice daily -Sucralfate 1 g suspension -Cialis 5 mg tablet (2 tablets daily)  Follow-up: - Please follow up with your primary care provider Dr. Elaina Pattee within the next 1-2 weeks (please call to schedule an appointment)  - Please follow up with your rheumatologist at  [facility] within the next 1-2 weeks (please call to schedule an appointment) Phone: [phone]  Address:  [address] [place], [place], [place] [address]

## 2023-01-01 ENCOUNTER — Encounter (HOSPITAL_COMMUNITY): Payer: Self-pay | Admitting: Internal Medicine

## 2023-01-01 ENCOUNTER — Inpatient Hospital Stay (HOSPITAL_COMMUNITY): Payer: Medicare Other

## 2023-01-01 DIAGNOSIS — G9340 Encephalopathy, unspecified: Secondary | ICD-10-CM | POA: Diagnosis not present

## 2023-01-01 DIAGNOSIS — A419 Sepsis, unspecified organism: Secondary | ICD-10-CM | POA: Diagnosis not present

## 2023-01-01 LAB — BASIC METABOLIC PANEL
Anion gap: 7 (ref 5–15)
BUN: 20 mg/dL (ref 8–23)
CO2: 21 mmol/L — ABNORMAL LOW (ref 22–32)
Calcium: 7.5 mg/dL — ABNORMAL LOW (ref 8.9–10.3)
Chloride: 101 mmol/L (ref 98–111)
Creatinine, Ser: 0.76 mg/dL (ref 0.61–1.24)
GFR, Estimated: >60 mL/min (ref >60)
Glucose, Bld: 141 mg/dL — ABNORMAL HIGH (ref 70–99)
Potassium: 3.5 mmol/L (ref 3.5–5.1)
Sodium: 129 mmol/L — ABNORMAL LOW (ref 135–145)

## 2023-01-01 LAB — CBC
HCT: 27.2 % — ABNORMAL LOW (ref 39.0–52.0)
Hemoglobin: 8.7 g/dL — ABNORMAL LOW (ref 13.0–17.0)
MCH: 28.1 pg (ref 26.0–34.0)
MCHC: 32 g/dL (ref 30.0–36.0)
MCV: 87.7 fL (ref 80.0–100.0)
Platelets: 99 10*3/uL — ABNORMAL LOW (ref 150–400)
RBC: 3.1 MIL/uL — ABNORMAL LOW (ref 4.22–5.81)
RDW: 17.7 % — ABNORMAL HIGH (ref 11.5–15.5)
WBC: 4.3 10*3/uL (ref 4.0–10.5)
nRBC: 0 % (ref 0.0–0.2)

## 2023-01-01 LAB — GLUCOSE, CAPILLARY
Glucose-Capillary: 155 mg/dL — ABNORMAL HIGH (ref 70–99)
Glucose-Capillary: 156 mg/dL — ABNORMAL HIGH (ref 70–99)
Glucose-Capillary: 171 mg/dL — ABNORMAL HIGH (ref 70–99)
Glucose-Capillary: 167 mg/dL — ABNORMAL HIGH (ref 70–99)
Glucose-Capillary: 215 mg/dL — ABNORMAL HIGH (ref 70–99)

## 2023-01-01 LAB — TECHNOLOGIST SMEAR REVIEW: Plt Morphology: DECREASED

## 2023-01-01 LAB — CBC WITH DIFFERENTIAL/PLATELET
Abs Immature Granulocytes: 0.03 10*3/uL (ref 0.00–0.07)
Basophils Absolute: 0 10*3/uL (ref 0.0–0.1)
Basophils Relative: 0 %
Eosinophils Absolute: 0 10*3/uL (ref 0.0–0.5)
Eosinophils Relative: 0 %
HCT: 26.4 % — ABNORMAL LOW (ref 39.0–52.0)
Hemoglobin: 8.7 g/dL — ABNORMAL LOW (ref 13.0–17.0)
Immature Granulocytes: 0 %
Lymphocytes Relative: 4 %
Lymphs Abs: 0.3 10*3/uL — ABNORMAL LOW (ref 0.7–4.0)
MCH: 28.2 pg (ref 26.0–34.0)
MCHC: 33 g/dL (ref 30.0–36.0)
MCV: 85.4 fL (ref 80.0–100.0)
Monocytes Absolute: 0.5 10*3/uL (ref 0.1–1.0)
Monocytes Relative: 7 %
Neutro Abs: 6.2 10*3/uL (ref 1.7–7.7)
Neutrophils Relative %: 89 %
Platelets: 96 10*3/uL — ABNORMAL LOW (ref 150–400)
RBC: 3.09 MIL/uL — ABNORMAL LOW (ref 4.22–5.81)
RDW: 17.7 % — ABNORMAL HIGH (ref 11.5–15.5)
WBC: 7 10*3/uL (ref 4.0–10.5)
nRBC: 0 % (ref 0.0–0.2)

## 2023-01-01 LAB — PROTIME-INR
INR: 1.2 (ref 0.8–1.2)
Prothrombin Time: 14.7 seconds (ref 11.4–15.2)

## 2023-01-01 MED ORDER — ORAL CARE MOUTH RINSE
15.0000 mL | OROMUCOSAL | Status: DC | PRN
  Administered 2023-01-01: 15 mL via OROMUCOSAL

## 2023-01-01 MED ORDER — HYDROCORTISONE SOD SUC (PF) 100 MG IJ SOLR
100.0000 mg | Freq: Two times a day (BID) | INTRAMUSCULAR | Status: DC
  Administered 2023-01-01: 100 mg via INTRAVENOUS
  Filled 2023-01-01: qty 2

## 2023-01-01 MED ORDER — ORAL CARE MOUTH RINSE
15.0000 mL | OROMUCOSAL | Status: DC
  Administered 2023-01-01 – 2023-01-06 (×14): 15 mL via OROMUCOSAL

## 2023-01-01 NOTE — Evaluation (Signed)
Clinical/Bedside Swallow Evaluation Patient Details  Name: [name] MRN: [id number] Date of Birth: January 18, 1946  Today's Date: 01/01/2023 Time: SLP Start Time (ACUTE ONLY): 1216 SLP Stop Time (ACUTE ONLY): 5883 SLP Time Calculation (min) (ACUTE ONLY): 19 min  Past Medical History:  Past Medical History:  Diagnosis Date   Aortic aneurysm ([place]) 05/2017   Chronic back pain    Diabetes mellitus without complication (HCC)    Dyslipidemia    GERD (gastroesophageal reflux disease)    H/O hiatal hernia    Hypertension    Melanoma ([place])    "back; left arm"   PONV (postoperative nausea and vomiting)    Rheumatoid arthritis ([place])    Spasm of esophagus    Past Surgical History:  Past Surgical History:  Procedure Laterality Date   aneursym repair     aneursym repair and stent placement  05/2017   BIOPSY  08/19/2022   Procedure: BIOPSY;  Surgeon: Ladene Artist, MD;  Location: [facility] ENDOSCOPY;  Service: Gastroenterology;;   CARPAL TUNNEL RELEASE Bilateral    COLON SURGERY  2007   removed 18" of colon   ERCP W/ METAL STENT PLACEMENT  11/2005   Archie Endo 12/14/2005   ESOPHAGOGASTRODUODENOSCOPY (EGD) WITH ESOPHAGEAL DILATION     "several times'   ESOPHAGOGASTRODUODENOSCOPY (EGD) WITH PROPOFOL N/A 08/19/2022   Procedure: ESOPHAGOGASTRODUODENOSCOPY (EGD) WITH PROPOFOL;  Surgeon: Ladene Artist, MD;  Location: [place];  Service: Gastroenterology;  Laterality: N/A;   HAND SURGERY Bilateral    "plastic knuckles"   LAPAROSCOPIC CHOLECYSTECTOMY  01/2006   MELANOMA EXCISION     "back; left arm"   NASAL SEPTUM SURGERY     PELVIC FRACTURE SURGERY  1964   "busted in 2 places"   RESECTION DISTAL CLAVICAL Right 05/13/2016   Procedure: DISTAL CLAVICLE EXCISION;  Surgeon: Ninetta Lights, MD;  Location: [place];  Service: Orthopedics;  Laterality: Right;   SHOULDER ARTHROSCOPY WITH ROTATOR CUFF REPAIR AND SUBACROMIAL DECOMPRESSION Right 05/13/2016   Procedure: RIGHT  SHOULDER SCOPE DEBRIDEMENT, ACROMIOPLASTY, ROTATOR CUFF REPAIR; RELEASE BICEPS TENDON AND DEBRIDEMENT LABRUM;  Surgeon: Ninetta Lights, MD;  Location: [place];  Service: Orthopedics;  Laterality: Right;   TIBIA FRACTURE SURGERY Left 2006   "put a steel rod in it"   HPI:  Pt is a 77 yo male, found to have PNA one week prior to admission, presented 1/1 with R sided weakness. MRI suggestive of acute/subacute infarcts in the high L frontal lobe and high L parietal lobe. CXR without focal consolidation but CT head/neck showed retained food or debris in the thoracic esophagus which was only mildly dilate, favoring esophageal dysmotility rather than achalasia. Pt also Flu A +. Pt most recently seen by SLP in August 2023, at which time he was experiencing GIB, reflux esophagitis, and duodenal ulcers and subsequently was on full liquid diet. PMH includes: SCC of tongue s/p XRT, dysphagia (most recent MBS in 2022 at [facility] without aspiration and wtih improved EAT and DIGEST scores), erosive esophagitis with recurrent esophageal strictures, duodenal ulcer disease, esophageal outlet obstructions s/p multiple dilations and GJ tube placement for FTT, DMII, HTN, RA.; ST re-consulted to assess current swallow function d/t code stroke and new cough noted by MD.    Assessment / Plan / Recommendation  Clinical Impression  Pt seen for limited CSE d/t pt's hx of esophagitis/chronic esophageal dysphagia impacting oral intake/baseline risk for aspiration.  Pt with a change in neurological status overnight prompting CSE.  Observation of  pt with generalized weakness orally and limited dentition, but otherwise, OME unremarkable.  Pt presenting with flat affect and answering yes/no responses only during swallowing assessment.  Oral care completed with pt agreeable and following simple oral directives with min verbal cues provided.  Pt consumed ice chips and thin liquids via cup sip with audible swallow noted with  thin and delayed cough prompted oral suction as pt was able to cough phlegm into oral cavity.  Belching occurred post-intake and post-prandial aspiration cannot be r/o as pt has extensive hx of chronic esophageal dysphagia.  Pt was consuming full liquids prior to this hospitalization, but d/t chronic esophageal concerns, pt currently NPO and utilizing GJ tube feedings for nutrtion/hydration.  If medically cleared, recommend oral care QID and ice chips if allowed for comfort.  Continue NPO status with ST s/o for swallowing tx as pt has chronic dysphagia primarily related to esophageal function.  Thank you for this consult. SLP Visit Diagnosis: Dysphagia, pharyngoesophageal phase (R13.14)    Aspiration Risk  Mild aspiration risk;Moderate aspiration risk    Diet Recommendation   NPO  Medication Administration: Via alternative means    Other  Recommendations Oral Care Recommendations: Oral care QID;Oral care prior to ice chip/H20;Other (Comment) (if pt allowed medically)    Recommendations for follow up therapy are one component of a multi-disciplinary discharge planning process, led by the attending physician.  Recommendations may be updated based on patient status, additional functional criteria and insurance authorization.  Follow up Recommendations Outpatient SLP      Assistance Recommended at Discharge    Functional Status Assessment Patient has had a recent decline in their functional status and demonstrates the ability to make significant improvements in function in a reasonable and predictable amount of time.  Frequency and Duration Other (Comment) (no f/u for swallowing tx)          Prognosis Barriers to Reach Goals: Severity of deficits      Swallow Study   General Date of Onset: 12/31/22 HPI: Pt is a 77 yo male, found to have PNA one week prior to admission, presented 1/1 with R sided weakness. MRI suggestive of acute/subacute infarcts in the high L frontal lobe and high L  parietal lobe. CXR without focal consolidation but CT head/neck showed retained food or debris in the thoracic esophagus which was only mildly dilate, favoring esophageal dysmotility rather than achalasia. Pt also Flu A +. Pt most recently seen by SLP in August 2023, at which time he was experiencing GIB, reflux esophagitis, and duodenal ulcers and subsequently was on full liquid diet. PMH includes: SCC of tongue s/p XRT, dysphagia (most recent MBS in 2022 at [facility] without aspiration and wtih improved EAT and DIGEST scores), erosive esophagitis with recurrent esophageal strictures, duodenal ulcer disease, esophageal outlet obstructions s/p multiple dilations and GJ tube placement for FTT, DMII, HTN, RA.; ST re-consulted to assess current swallow function d/t code stroke and new cough noted by MD. Type of Study: Bedside Swallow Evaluation Previous Swallow Assessment: 12/28/22 indicating need for NPO primarily d/t GI recommendation Diet Prior to this Study: NPO (GJ tube feedings) Temperature Spikes Noted: Yes (low grade) Respiratory Status: Room air History of Recent Intubation: No Behavior/Cognition: Cooperative;Requires cueing;Other (Comment) (flat affect) Oral Cavity Assessment: Other (comment) (xerostomia) Oral Care Completed by SLP: Yes Oral Cavity - Dentition: Missing dentition Self-Feeding Abilities: Needs assist Patient Positioning: Upright in bed Baseline Vocal Quality: Low vocal intensity Volitional Cough: Strong Volitional Swallow: Unable to elicit    Oral/Motor/Sensory  Function Overall Oral Motor/Sensory Function: Generalized oral weakness   Ice Chips Ice chips: Impaired Presentation: Spoon Oral Phase Functional Implications: Oral holding   Thin Liquid Thin Liquid: Impaired Presentation: Cup Oral Phase Functional Implications: Oral holding Pharyngeal  Phase Impairments:  (audible swallow)    Nectar Thick Nectar Thick Liquid: Not tested   Honey Thick Honey Thick Liquid: Not tested    Puree Puree: Not tested   Solid     Solid: Not tested      Elvina Sidle, M.S., CCC-SLP 01/01/2023,2:52 PM

## 2023-01-01 NOTE — Plan of Care (Signed)
  Problem: Education: Goal: Knowledge of disease or condition will improve Outcome: Not Progressing Goal: Knowledge of secondary prevention will improve (MUST DOCUMENT ALL) Outcome: Not Progressing Goal: Knowledge of patient specific risk factors will improve Elta [place] N/A or DELETE if not current risk factor) Outcome: Not Progressing   Problem: Ischemic Stroke/TIA Tissue Perfusion: Goal: Complications of ischemic stroke/TIA will be minimized Outcome: Not Progressing   Problem: Coping: Goal: Will verbalize positive feelings about self Outcome: Not Progressing Goal: Will identify appropriate support needs Outcome: Not Progressing   Problem: Health Behavior/Discharge Planning: Goal: Ability to manage health-related needs will improve Outcome: Not Progressing Goal: Goals will be collaboratively established with patient/family Outcome: Not Progressing   Problem: Self-Care: Goal: Ability to participate in self-care as condition permits will improve Outcome: Not Progressing Goal: Verbalization of feelings and concerns over difficulty with self-care will improve Outcome: Not Progressing Goal: Ability to communicate needs accurately will improve Outcome: Not Progressing   Problem: Nutrition: Goal: Risk of aspiration will decrease Outcome: Not Progressing Goal: Dietary intake will improve Outcome: Not Progressing   Problem: Education: Goal: Knowledge of General Education information will improve Description: Including pain rating scale, medication(s)/side effects and non-pharmacologic comfort measures Outcome: Not Progressing   Problem: Health Behavior/Discharge Planning: Goal: Ability to manage health-related needs will improve Outcome: Not Progressing   Problem: Clinical Measurements: Goal: Ability to maintain clinical measurements within normal limits will improve Outcome: Not Progressing Goal: Will remain free from infection Outcome: Not Progressing Goal: Diagnostic test  results will improve Outcome: Not Progressing Goal: Respiratory complications will improve Outcome: Not Progressing Goal: Cardiovascular complication will be avoided Outcome: Not Progressing   Problem: Activity: Goal: Risk for activity intolerance will decrease Outcome: Not Progressing   Problem: Nutrition: Goal: Adequate nutrition will be maintained Outcome: Not Progressing   Problem: Coping: Goal: Level of anxiety will decrease Outcome: Not Progressing   Problem: Elimination: Goal: Will not experience complications related to bowel motility Outcome: Not Progressing Goal: Will not experience complications related to urinary retention Outcome: Not Progressing   Problem: Pain Managment: Goal: General experience of comfort will improve Outcome: Not Progressing   Problem: Safety: Goal: Ability to remain free from injury will improve Outcome: Not Progressing   Problem: Skin Integrity: Goal: Risk for impaired skin integrity will decrease Outcome: Not Progressing

## 2023-01-01 NOTE — Progress Notes (Signed)
Subjective:  Yesterday afternoon patient had an episode of hypotension while working with PT, with subsequent worsening confusion, expressive aphasia, and R-sided weakness. Code stroke was called. CT head and CTA head and neck showed no acute changes. See yesterday's progress note and Neurology's note for more details. Patient continues to have confusion, dysarthria, and right-sided weakness this morning.   Objective:  Vital signs in last 24 hours: Vitals:   01/01/23 0455 01/01/23 0545 01/01/23 0749 01/01/23 1000  BP:  (!) 120/59 (!) 124/59 (!) 118/50  Pulse:  76 76   Resp:  18 18   Temp:  98.8 F (37.1 C) 98.9 F (37.2 C)   TempSrc:   Oral   SpO2:  97% 96%   Weight: 74.6 kg     Height:       Weight change:   Intake/Output Summary (Last 24 hours) at 01/01/2023 1143 Last data filed at 01/01/2023 7654 Gross per 24 hour  Intake 2172.52 ml  Output 1100 ml  Net 1072.52 ml   Physical Exam General: Lethargic, ill-appearing.  HEENT: Normocephalic, oral secretions CV: RRR, no murmurs, rubs, or gallops.  Pulm: Cough worse on exam today. No respiratory distress. Auscultation at anterior fields were clear, exam otherwise limited by patient's inability to follow commands. On RA.   Abd: Soft, non-distended, non-tender to palpation.  Skin: Warm and dry Ext: No LE edema Neuro: Awakes to voice. Dysarthria. Only oriented to self, does not answer most questions. Unable to follow commands.   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     Sepsis with Acute Encephalopathy Recent hx of pneumonia Influenza A Patient initially presented with recent pneumonia and had completed 3 days of vancomycin, 5 days of cefepime, and 5-day course of Tamiflu so far this admission. Patient is coughing more frequently today and has worsened confusion, CXR shows mild bibasilar opacities suggesting possible aspiration, no focal consolidation. Patient's episode of hypotension yesterday is more likely orthostatic than sepsis given he was working with PT and patient has remained afebrile with WBC 4.3, no growth on blood cultures <24hrs. Will continue to observe and restart antibiotics if he becomes febrile, worsening lung exam, or increased oxygen requirements.  -F/u repeat blood culture -SLP eval for aspiration risk  Left MCA CVA  40-59% stenosis in left ICA, 1-39% stenosis in right ICA on Carotid U/S. Patient developed brief episode of hypotension on 1/5 around 3PM while working with PT. Was noted to have worsening confusion, right-sided weakness, and aphasia. Code stroke was called. CT head and CTA head/neck without acute changes. Given 500 mL LR bolus. Repeat neuro exam following return from CT included expressive aphasia, 4/5 strength of RUE, 1/5 right grip strength, strength otherwise intact, no facial droop. Diagnostic angio now planned for early this week, likely Monday. Neuro recommended to transition to DAPT with Ticagrelor + ASA and discontinue Plavix in anticipation of diagnostic angio/stenting. On exam today patient has worsened mental status, with increased confusion and dysarthria today and is unable to follow commands. No other new  motor deficits were noted, although exam is limited by patient's ability to participate. Plan for MRI brain without contrast today given worsening neurological symptoms. Patient's worsened mental status is concerning for unstable CVA, likely precipitated by episode of  hypotension yesterday. Will continue to bolus as needed and start stress dose steroids to prevent further hypotension. Will have nursing staff suction oral secretions. Son updated on condition and plan today.  -Ticagrelor '90mg'$  BID -ASA '81mg'$  -Atorvastatin '40mg'$  daily -F/u MRI Brain  -Bed rest ordered, hold PT/OT for today -Neuro checks -Telemetry -IV Solu-Cortef '100mg'$  today, transition to prednisone '40mg'$  tomorrow  Thrombocytopenia Platelets down to 99 today from 123 yesterday. No heparin given during hospitalization. Potentially from suppression in setting of influenza infection. May require platelet transfusion if platelets continue to drop. -F/u PT/INR -F/u blood smear -Trend CBC  Malnutrition  Hypokalemia - resolved Hypomagnesemia - resolved Hypophosphatemia - resolved  Continuous tube feeds at home, recently interrupted w/ frequent medical visits. Will continue w/ continuous tube feeds and free H20 flushes. RD consulted. Home regimen will likely be osmolite 1.5 at 80 ml/h x 18 hours and 200 mL free water q4 hours upon discharge.   -Osmolite 1000 mL (60 mL/hr) per tube -Prosouce 60 mL daily per tube -Free water flushes 200 mL q4 hours per tube  T2DM  A1c 6.2%. Taking metformin at home. AM fasting glucose 141, range 101-153 over last 24 hrs.  -Trend CBGs   Hypothyroidism  On levothyroxine at home.   -Levothyroxine 50 mcg daily, per tube  Chronic Steroids  Patient on chronic steroids prior to admission. Patient initially given stress dose steroids on presentation to the ED, then was transitioned to prednisone '1mg'$ . With patient's new worsening mental status in setting of hypotension after recent CVA, will restart stress dose steroids with transition to prednisone tomorrow to prevent further episodes of hypotension.  -IV Solu-Cortef '100mg'$  today, transition to prednisone '40mg'$  tomorrow  Esophageal Outlet Obstruction with PEG tube   Plan for patient to follow up with outpatient GI for  further workup if needed.    Diet: Tube feeds Bowel: none VTE: none IVF: None Code: Full   Prior to Admission Living Arrangement: at home w/ ex-wife  Anticipated Discharge Location: TBD Barriers to Discharge: continued management Dispo: Anticipated discharge in approximately more than 2 day(s).    LOS: 5 days   Spero Curb, Medical Student 01/01/2023, 11:43 AM  Attestation for Student Documentation:  I personally was present and performed or re-performed the history, physical exam and medical decision-making activities of this service and have verified that the service and findings are accurately documented in the student's note.  Starlyn Skeans, MD 01/01/2023, 1:50 PM

## 2023-01-01 NOTE — Progress Notes (Signed)
PT Cancellation Note  Patient Details Name: [name] MRN: [id number] DOB: [birth year]   Cancelled Treatment:    Reason Eval/Treat Not Completed: Active bedrest order  Noted overnight events with ? New CVA. Patient currently on bedrest. Will follow and resume activity when bedrest order removed.   [place]  Office [phone]   Rexanne Mano 01/01/2023, 11:56 AM

## 2023-01-02 LAB — CULTURE, BLOOD (ROUTINE X 2): Culture: NO GROWTH

## 2023-01-02 LAB — BASIC METABOLIC PANEL
Anion gap: 9 (ref 5–15)
BUN: 19 mg/dL (ref 8–23)
CO2: 23 mmol/L (ref 22–32)
Calcium: 8.1 mg/dL — ABNORMAL LOW (ref 8.9–10.3)
Chloride: 101 mmol/L (ref 98–111)
Creatinine, Ser: 0.85 mg/dL (ref 0.61–1.24)
GFR, Estimated: >60 mL/min (ref >60)
Glucose, Bld: 268 mg/dL — ABNORMAL HIGH (ref 70–99)
Potassium: 3.7 mmol/L (ref 3.5–5.1)
Sodium: 133 mmol/L — ABNORMAL LOW (ref 135–145)

## 2023-01-02 LAB — GLUCOSE, CAPILLARY
Glucose-Capillary: 244 mg/dL — ABNORMAL HIGH (ref 70–99)
Glucose-Capillary: 186 mg/dL — ABNORMAL HIGH (ref 70–99)
Glucose-Capillary: 187 mg/dL — ABNORMAL HIGH (ref 70–99)
Glucose-Capillary: 276 mg/dL — ABNORMAL HIGH (ref 70–99)
Glucose-Capillary: 231 mg/dL — ABNORMAL HIGH (ref 70–99)
Glucose-Capillary: 137 mg/dL — ABNORMAL HIGH (ref 70–99)

## 2023-01-02 LAB — CBC
HCT: 26.4 % — ABNORMAL LOW (ref 39.0–52.0)
Hemoglobin: 9.1 g/dL — ABNORMAL LOW (ref 13.0–17.0)
MCH: 29.3 pg (ref 26.0–34.0)
MCHC: 34.5 g/dL (ref 30.0–36.0)
MCV: 84.9 fL (ref 80.0–100.0)
Platelets: 94 10*3/uL — ABNORMAL LOW (ref 150–400)
RBC: 3.11 MIL/uL — ABNORMAL LOW (ref 4.22–5.81)
RDW: 17.9 % — ABNORMAL HIGH (ref 11.5–15.5)
WBC: 4.2 10*3/uL (ref 4.0–10.5)
nRBC: 0 % (ref 0.0–0.2)

## 2023-01-02 LAB — TSH: TSH: 4.774 u[IU]/mL — ABNORMAL HIGH (ref 0.350–4.500)

## 2023-01-02 MED ORDER — PREDNISONE 20 MG PO TABS
40.0000 mg | ORAL_TABLET | Freq: Every day | ORAL | Status: DC
  Administered 2023-01-02 – 2023-01-04 (×2): 40 mg
  Filled 2023-01-02 (×2): qty 2

## 2023-01-02 MED ORDER — INSULIN ASPART 100 UNIT/ML IJ SOLN
0.0000 [IU] | Freq: Three times a day (TID) | INTRAMUSCULAR | Status: DC
  Administered 2023-01-02: 2 [IU] via SUBCUTANEOUS
  Administered 2023-01-02: 5 [IU] via SUBCUTANEOUS
  Administered 2023-01-02: 2 [IU] via SUBCUTANEOUS
  Administered 2023-01-03: 1 [IU] via SUBCUTANEOUS
  Administered 2023-01-04: 3 [IU] via SUBCUTANEOUS
  Administered 2023-01-04: 2 [IU] via SUBCUTANEOUS
  Administered 2023-01-05: 3 [IU] via SUBCUTANEOUS
  Administered 2023-01-05: 5 [IU] via SUBCUTANEOUS
  Administered 2023-01-06: 3 [IU] via SUBCUTANEOUS

## 2023-01-02 MED ORDER — INSULIN ASPART 100 UNIT/ML IJ SOLN
0.0000 [IU] | Freq: Every day | INTRAMUSCULAR | Status: DC
  Administered 2023-01-02: 2 [IU] via SUBCUTANEOUS
  Administered 2023-01-04: 3 [IU] via SUBCUTANEOUS

## 2023-01-02 MED ORDER — LACTATED RINGERS IV BOLUS
500.0000 mL | Freq: Once | INTRAVENOUS | Status: AC
  Administered 2023-01-02: 500 mL via INTRAVENOUS

## 2023-01-02 NOTE — Progress Notes (Signed)
Occupational Therapy Treatment Patient Details Name: [name] MRN: [id number] DOB: 1946/04/11 Today's Date: 01/02/2023   History of present illness BIJAN RIDGLEY is a 77 y.o. male who presents after being found on floor unable to talk and get up.  MRI positive for scattered foci of restricted diffusion involving the high left frontal lobe and high left parietal lobe compatible with acute/subacute nonhemorrhagic infarcts.  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  He also tested positive for the flu A and possible aspiration PNA.  Pt with PMH of RA, ILD, Hx of DVT/PE, T2DM, HTN, HLD, Esophageal outlet obstructions s/p multiple dilatations and GJ tube placement for FTT, severe protein calorie malnutrition, AAA L [place]iac artery aneurysm s/p EVAR   OT comments  Patient seen for RUE HEP at bed level. Patient instructed on SROM for RUE shoulder flexion and AROM for elbow flexion/extension with flexion limited due to IV site. Patient demonstrating improvement with supination/pronation and wrist flexion and extension with patient able to demonstrate AROM. Patient demonstrating AROM for finger flexion with trace movement for finger extension. Patient would benefit from continue OT to address RUE functional use and increasing independence with self care tasks.    Recommendations for follow up therapy are one component of a multi-disciplinary discharge planning process, led by the attending physician.  Recommendations may be updated based on patient status, additional functional criteria and insurance authorization.    Follow Up Recommendations  Home health OT     Assistance Recommended at Discharge Frequent or constant Supervision/Assistance  Patient can return home with the following  A lot of help with walking and/or transfers;A lot of help with bathing/dressing/bathroom;Assistance with cooking/housework;Direct supervision/assist for medications  management;Direct supervision/assist for financial management;Assist for transportation;Help with stairs or ramp for entrance   Equipment Recommendations  None recommended by OT    Recommendations for Other Services      Precautions / Restrictions Precautions Precautions: Fall Precaution Comments: peg tube, flu + Restrictions Weight Bearing Restrictions: No       Mobility Bed Mobility Overal bed mobility: Needs Assistance                  Transfers                         Balance                                           ADL either performed or assessed with clinical judgement   ADL Overall ADL's : Needs assistance/impaired                                       General ADL Comments: focused on RUE HEP    Extremity/Trunk Assessment Upper Extremity Assessment RUE Deficits / Details: improvement with finger flexion and wrist flexion and extention. Trace finger extension. Elbow flexion limited to IV site. RUE Sensation: WNL RUE Coordination: decreased fine motor            Vision       Perception     Praxis      Cognition Arousal/Alertness: Awake/alert Behavior During Therapy: Flat affect Overall Cognitive Status: Difficult to assess  General Comments: motivated to improve stated, "I want to get better"        Exercises Exercises: General Upper Extremity General Exercises - Upper Extremity Shoulder Flexion: Self ROM, Right, 10 reps Shoulder ABduction: AAROM, Right, 10 reps Elbow Flexion: AROM, Right, 10 reps Elbow Extension: Strengthening, Right, 10 reps Wrist Flexion: AROM, 10 reps, Right, Other (comment) (gravity elimanated) Wrist Extension: AROM, Right, 10 reps, Other (comment) (gravity elimanated) Digit Composite Flexion: Right, AROM Composite Extension: PROM, Right    Shoulder Instructions       General Comments      Pertinent Vitals/  Pain       Pain Assessment Pain Assessment: Faces Faces Pain Scale: No hurt Pain Intervention(s): Monitored during session  Home Living                                          Prior Functioning/Environment              Frequency  Min 2X/week        Progress Toward Goals  OT Goals(current goals can now be found in the care plan section)  Progress towards OT goals: Progressing toward goals  Acute Rehab OT Goals Patient Stated Goal: get stronger OT Goal Formulation: With patient Time For Goal Achievement: 01/11/23 Potential to Achieve Goals: Fair ADL Goals Pt Will Perform Eating: with modified independence;sitting;with adaptive utensils Pt Will Perform Grooming: with modified independence;standing Pt Will Perform Upper Body Bathing: with set-up;sitting Pt Will Perform Lower Body Bathing: with supervision;sit to/from stand Pt Will Perform Upper Body Dressing: with supervision;sitting Pt Will Transfer to Toilet: with modified independence;regular height toilet;ambulating Pt Will Perform Toileting - Clothing Manipulation and hygiene: with modified independence;sit to/from stand Pt/caregiver will Perform Home Exercise Program: Right Upper extremity;Increased strength;Increased ROM;With written HEP provided;Independently  Plan Discharge plan needs to be updated    Co-evaluation                 AM-PAC OT "6 Clicks" Daily Activity     Outcome Measure   Help from another person eating meals?: A Little Help from another person taking care of personal grooming?: A Little Help from another person toileting, which includes using toliet, bedpan, or urinal?: A Lot Help from another person bathing (including washing, rinsing, drying)?: A Lot Help from another person to put on and taking off regular upper body clothing?: A Lot Help from another person to put on and taking off regular lower body clothing?: A Lot 6 Click Score: 14    End of Session     OT Visit Diagnosis: Unsteadiness on feet (R26.81);Muscle weakness (generalized) (M62.81);Pain   Activity Tolerance Patient tolerated treatment well   Patient Left in bed;with call bell/phone within reach;with bed alarm set   Nurse Communication Mobility status        Time: 9678-9381 OT Time Calculation (min): 22 min  Charges: OT General Charges $OT Visit: 1 Visit OT Treatments $Therapeutic Exercise: 8-22 mins  Lodema Hong, [place]  Office [place] 01/02/2023, 2:34 PM

## 2023-01-02 NOTE — Plan of Care (Signed)
  Problem: Education: Goal: Knowledge of disease or condition will improve Outcome: Progressing Goal: Knowledge of secondary prevention will improve (MUST DOCUMENT ALL) Outcome: Progressing Goal: Knowledge of patient specific risk factors will improve [place] N/A or DELETE if not current risk factor) Outcome: Progressing   Problem: Ischemic Stroke/TIA Tissue Perfusion: Goal: Complications of ischemic stroke/TIA will be minimized Outcome: Progressing   Problem: Coping: Goal: Will verbalize positive feelings about self Outcome: Progressing Goal: Will identify appropriate support needs Outcome: Progressing   Problem: Health Behavior/Discharge Planning: Goal: Ability to manage health-related needs will improve Outcome: Progressing Goal: Goals will be collaboratively established with patient/family Outcome: Progressing   Problem: Self-Care: Goal: Ability to participate in self-care as condition permits will improve Outcome: Progressing Goal: Verbalization of feelings and concerns over difficulty with self-care will improve Outcome: Progressing Goal: Ability to communicate needs accurately will improve Outcome: Progressing   Problem: Nutrition: Goal: Risk of aspiration will decrease Outcome: Progressing Goal: Dietary intake will improve Outcome: Progressing   Problem: Education: Goal: Knowledge of General Education information will improve Description: Including pain rating scale, medication(s)/side effects and non-pharmacologic comfort measures Outcome: Progressing   Problem: Health Behavior/Discharge Planning: Goal: Ability to manage health-related needs will improve Outcome: Progressing   Problem: Clinical Measurements: Goal: Ability to maintain clinical measurements within normal limits will improve Outcome: Progressing Goal: Will remain free from infection Outcome: Progressing Goal: Diagnostic test results will improve Outcome: Progressing Goal: Respiratory  complications will improve Outcome: Progressing Goal: Cardiovascular complication will be avoided Outcome: Progressing   Problem: Activity: Goal: Risk for activity intolerance will decrease Outcome: Progressing   Problem: Nutrition: Goal: Adequate nutrition will be maintained Outcome: Progressing   Problem: Coping: Goal: Level of anxiety will decrease Outcome: Progressing   Problem: Elimination: Goal: Will not experience complications related to bowel motility Outcome: Progressing Goal: Will not experience complications related to urinary retention Outcome: Progressing   Problem: Pain Managment: Goal: General experience of comfort will improve Outcome: Progressing   Problem: Safety: Goal: Ability to remain free from injury will improve Outcome: Progressing   Problem: Skin Integrity: Goal: Risk for impaired skin integrity will decrease Outcome: Progressing   Problem: Education: Goal: Ability to describe self-care measures that may prevent or decrease complications (Diabetes Survival Skills Education) will improve Outcome: Progressing Goal: Individualized Educational Video(s) Outcome: Progressing   Problem: Coping: Goal: Ability to adjust to condition or change in health will improve Outcome: Progressing   Problem: Fluid Volume: Goal: Ability to maintain a balanced intake and output will improve Outcome: Progressing   Problem: Health Behavior/Discharge Planning: Goal: Ability to identify and utilize available resources and services will improve Outcome: Progressing Goal: Ability to manage health-related needs will improve Outcome: Progressing   Problem: Metabolic: Goal: Ability to maintain appropriate glucose levels will improve Outcome: Progressing   Problem: Nutritional: Goal: Maintenance of adequate nutrition will improve Outcome: Progressing Goal: Progress toward achieving an optimal weight will improve Outcome: Progressing   Problem: Skin  Integrity: Goal: Risk for impaired skin integrity will decrease Outcome: Progressing   Problem: Tissue Perfusion: Goal: Adequacy of tissue perfusion will improve Outcome: Progressing

## 2023-01-02 NOTE — Progress Notes (Signed)
Subjective:  Patient feels much more oriented today. He is determined to regain strength in hand. Updated him on plan for stent likely tomorrow.   Objective:  Vital signs in last 24 hours: Vitals:   01/01/23 2323 01/02/23 0500 01/02/23 0719 01/02/23 1208  BP: (!) 110/51  (!) 110/54 (!) 122/53  Pulse: 60  62 68  Resp: '17  17 16  '$ Temp: 97.9 F (36.6 C)  98 F (36.7 C) 98.3 F (36.8 C)  TempSrc: Oral  Oral Oral  SpO2: 100%  100% 100%  Weight:  73.9 kg    Height:       Weight change: -0.7 kg  Intake/Output Summary (Last 24 hours) at 01/02/2023 1227 Last data filed at 01/01/2023 2141 Gross per 24 hour  Intake 750.67 ml  Output 1800 ml  Net -1049.33 ml    Physical Exam General: chronically ill appearing HEENT: Normocephalic, oral secretions CV: RRR, no murmurs, rubs, or gallops.  Pulm: Cough improved. No respiratory distress. On RA.   Skin: Warm and dry Ext: No LE edema Neuro: oriented to person, place, time. Conversational. Follows commands. Speech improved. Moves all extremities. 2/5 grip strength improving. 4/5 strength wrist. 4+/5 elbow on R. Left side unchanged from prior days.  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     Sepsis with Acute Encephalopathy Recent hx of pneumonia Influenza A This is resolving. He is more hemodynamically stable today, though we are giving fluids to prevent hypoperfusion of brain. Has been afebrile and without leukocytosis. Mentation  appears to be improving as well. Coughing less today. Does not appear to have infection at this time. Patient is on bedrest so it will be important he use IS and flutter valve to prevent atelectasis and PNA. - supportive care - IS and flutter valve  Left MCA CVA  40-59% stenosis in left ICA, 1-39% stenosis in right ICA on Carotid U/S. Having episodes of symptomatic cerebral hypoperfusion with carotid stenting with Dr. Debbrah Alar planned on Monday. Ticagrelor started and on ASA. Need to ensure that BP remains adequate for perfusion. Strict bedrest orders in place to prevent orthostatic hypotension. He is NPO so we will bolus fluids as needed. He is also on prednisone '40mg'$  to prevent any relative adrenal insufficiency.  -Ticagrelor '90mg'$  BID -ASA '81mg'$  -Atorvastatin '40mg'$  daily -Bed rest ordered, hold PT/OT for today -Neuro checks -Telemetry - Prednisone 40  Thrombocytopenia Platelets down to 94 today and appears to be stabilizing. No heparin given during hospitalization. Smear and PT/INR normal, so drop likely from suppression in setting of influenza infection. May require platelet transfusion if platelets continue to drop, but hopefully stabilizing. -Trend CBC - platelet transfusion as necessary for vascular intervention.   Malnutrition  Hypokalemia - resolved Hypomagnesemia - resolved Hypophosphatemia - resolved  Continuous tube feeds at home, recently interrupted w/ frequent medical visits. Will continue w/ continuous tube feeds and free H20 flushes. RD consulted. Home regimen will likely be osmolite 1.5 at 80 ml/h x 18 hours and 200 mL free water q4 hours upon  discharge.   -Osmolite 1000 mL (60 mL/hr) per tube -Prosouce 60 mL daily per tube -Free water flushes 200 mL q4 hours per tube  T2DM   -Trend CBGs  - SSI  Hypothyroidism  On levothyroxine at home.  TSH mildly elevated, but this is in setting of acute illness. No adjustments at this time.  -Levothyroxine 50 mcg daily, per  tube  Esophageal Outlet Obstruction with PEG tube   Plan for patient to follow up with outpatient GI for further workup if needed.    Diet: Tube feeds Bowel: none VTE: none IVF: None Code: Full Prior to Admission Living Arrangement: at home w/ ex-wife  Anticipated Discharge Location: TBD Barriers to Discharge: continued management Dispo: Anticipated discharge in approximately more than 2 day(s).  Delene Ruffini, MD

## 2023-01-02 NOTE — Progress Notes (Signed)
  Discussed patient with Dr. Karenann Cai  Apparently he had another event so we will plan cerebral angiography tomorrow.  Full consent previously done by Brynda Greathouse, PA-C on 12/30/2022.  NPO @ [facility] order placed.  Judie Grieve Lovett Coffin PA-C 01/02/2023 12:54 PM

## 2023-01-03 ENCOUNTER — Inpatient Hospital Stay (HOSPITAL_COMMUNITY): Payer: Medicare Other

## 2023-01-03 ENCOUNTER — Encounter (HOSPITAL_COMMUNITY): Payer: Self-pay | Admitting: Internal Medicine

## 2023-01-03 ENCOUNTER — Inpatient Hospital Stay (HOSPITAL_COMMUNITY): Payer: Medicare Other | Admitting: Certified Registered"

## 2023-01-03 ENCOUNTER — Encounter (HOSPITAL_COMMUNITY)
Admission: EM | Disposition: A | Discharge status: Rehabilitation Facility | Payer: Self-pay | Source: Home / Self Care | Attending: Internal Medicine

## 2023-01-03 DIAGNOSIS — Z87891 Personal history of nicotine dependence: Secondary | ICD-10-CM

## 2023-01-03 DIAGNOSIS — I639 Cerebral infarction, unspecified: Secondary | ICD-10-CM

## 2023-01-03 DIAGNOSIS — J101 Influenza due to other identified influenza virus with other respiratory manifestations: Secondary | ICD-10-CM | POA: Diagnosis not present

## 2023-01-03 DIAGNOSIS — I10 Essential (primary) hypertension: Secondary | ICD-10-CM

## 2023-01-03 DIAGNOSIS — I63232 Cerebral infarction due to unspecified occlusion or stenosis of left carotid arteries: Secondary | ICD-10-CM

## 2023-01-03 DIAGNOSIS — J449 Chronic obstructive pulmonary disease, unspecified: Secondary | ICD-10-CM

## 2023-01-03 DIAGNOSIS — G9340 Encephalopathy, unspecified: Secondary | ICD-10-CM | POA: Diagnosis not present

## 2023-01-03 HISTORY — PX: IR ANGIO VERTEBRAL SEL SUBCLAVIAN INNOMINATE UNI L MOD SED: IMG5364

## 2023-01-03 HISTORY — PX: RADIOLOGY WITH ANESTHESIA: SHX6223

## 2023-01-03 HISTORY — PX: IR INTRAVSC STENT CERV CAROTID W/EMB-PROT MOD SED INCL ANGIO: IMG2303

## 2023-01-03 HISTORY — PX: IR US GUIDE VASC ACCESS RIGHT: IMG2390

## 2023-01-03 HISTORY — PX: IR ANGIO VERTEBRAL SEL VERTEBRAL UNI R MOD SED: IMG5368

## 2023-01-03 HISTORY — PX: IR ANGIO INTRA EXTRACRAN SEL COM CAROTID INNOMINATE UNI R MOD SED: IMG5359

## 2023-01-03 LAB — GLUCOSE, CAPILLARY
Glucose-Capillary: 105 mg/dL — ABNORMAL HIGH (ref 70–99)
Glucose-Capillary: 114 mg/dL — ABNORMAL HIGH (ref 70–99)
Glucose-Capillary: 117 mg/dL — ABNORMAL HIGH (ref 70–99)
Glucose-Capillary: 120 mg/dL — ABNORMAL HIGH (ref 70–99)
Glucose-Capillary: 121 mg/dL — ABNORMAL HIGH (ref 70–99)
Glucose-Capillary: 122 mg/dL — ABNORMAL HIGH (ref 70–99)
Glucose-Capillary: 130 mg/dL — ABNORMAL HIGH (ref 70–99)
Glucose-Capillary: 152 mg/dL — ABNORMAL HIGH (ref 70–99)
Glucose-Capillary: 91 mg/dL (ref 70–99)

## 2023-01-03 LAB — CBC
HCT: 26.8 % — ABNORMAL LOW (ref 39.0–52.0)
Hemoglobin: 9.1 g/dL — ABNORMAL LOW (ref 13.0–17.0)
MCH: 29.1 pg (ref 26.0–34.0)
MCHC: 34 g/dL (ref 30.0–36.0)
MCV: 85.6 fL (ref 80.0–100.0)
Platelets: 108 10*3/uL — ABNORMAL LOW (ref 150–400)
RBC: 3.13 MIL/uL — ABNORMAL LOW (ref 4.22–5.81)
RDW: 17.7 % — ABNORMAL HIGH (ref 11.5–15.5)
WBC: 6.3 10*3/uL (ref 4.0–10.5)
nRBC: 0 % (ref 0.0–0.2)

## 2023-01-03 LAB — BASIC METABOLIC PANEL
Anion gap: 8 (ref 5–15)
BUN: 21 mg/dL (ref 8–23)
CO2: 23 mmol/L (ref 22–32)
Calcium: 8.4 mg/dL — ABNORMAL LOW (ref 8.9–10.3)
Chloride: 104 mmol/L (ref 98–111)
Creatinine, Ser: 0.65 mg/dL (ref 0.61–1.24)
GFR, Estimated: >60 mL/min (ref >60)
Glucose, Bld: 139 mg/dL — ABNORMAL HIGH (ref 70–99)
Potassium: 3.6 mmol/L (ref 3.5–5.1)
Sodium: 135 mmol/L (ref 135–145)

## 2023-01-03 SURGERY — IR WITH ANESTHESIA
Anesthesia: Monitor Anesthesia Care

## 2023-01-03 MED ORDER — LIDOCAINE 2% (20 MG/ML) 5 ML SYRINGE
INTRAMUSCULAR | Status: DC | PRN
  Administered 2023-01-03: 50 mg via INTRAVENOUS

## 2023-01-03 MED ORDER — ORAL CARE MOUTH RINSE: 15.0000 mL | Freq: Once | OROMUCOSAL | Status: DC

## 2023-01-03 MED ORDER — FENTANYL CITRATE (PF) 100 MCG/2ML IJ SOLN
INTRAMUSCULAR | Status: DC | PRN
  Administered 2023-01-03: 25 ug via INTRAVENOUS

## 2023-01-03 MED ORDER — CLEVIDIPINE BUTYRATE 0.5 MG/ML IV EMUL
0.0000 mg/h | INTRAVENOUS | Status: DC
  Administered 2023-01-03: 2 mg/h via INTRAVENOUS
  Administered 2023-01-03: 6 mg/h via INTRAVENOUS
  Administered 2023-01-04: 2 mg/h via INTRAVENOUS
  Filled 2023-01-03 (×2): qty 100
  Filled 2023-01-03 (×2): qty 50

## 2023-01-03 MED ORDER — OXYCODONE HCL 5 MG PO TABS: 5.0000 mg | ORAL_TABLET | Freq: Once | ORAL | Status: DC | PRN

## 2023-01-03 MED ORDER — ACETAMINOPHEN 325 MG PO TABS: 650.0000 mg | ORAL_TABLET | ORAL | Status: DC | PRN

## 2023-01-03 MED ORDER — ACETAMINOPHEN 160 MG/5ML PO SOLN
650.0000 mg | ORAL | Status: DC | PRN
  Administered 2023-01-03 – 2023-01-04 (×3): 650 mg
  Filled 2023-01-03 (×3): qty 20.3

## 2023-01-03 MED ORDER — LACTATED RINGERS IV SOLN: INTRAVENOUS | Status: DC

## 2023-01-03 MED ORDER — INSULIN ASPART 100 UNIT/ML IJ SOLN: 0.0000 [IU] | INTRAMUSCULAR | Status: DC | PRN

## 2023-01-03 MED ORDER — ONDANSETRON HCL 4 MG/2ML IJ SOLN
INTRAMUSCULAR | Status: DC | PRN
  Administered 2023-01-03: 4 mg via INTRAVENOUS

## 2023-01-03 MED ORDER — PHENYLEPHRINE 80 MCG/ML (10ML) SYRINGE FOR IV PUSH (FOR BLOOD PRESSURE SUPPORT)
PREFILLED_SYRINGE | INTRAVENOUS | Status: DC | PRN
  Administered 2023-01-03 (×2): 160 ug via INTRAVENOUS
  Administered 2023-01-03: 80 ug via INTRAVENOUS

## 2023-01-03 MED ORDER — PROPOFOL 500 MG/50ML IV EMUL
INTRAVENOUS | Status: DC | PRN
  Administered 2023-01-03: 125 ug/kg/min via INTRAVENOUS

## 2023-01-03 MED ORDER — CHLORHEXIDINE GLUCONATE CLOTH 2 % EX PADS
6.0000 | MEDICATED_PAD | Freq: Every day | CUTANEOUS | Status: DC
  Administered 2023-01-03 – 2023-01-06 (×3): 6 via TOPICAL

## 2023-01-03 MED ORDER — CHLORHEXIDINE GLUCONATE 0.12 % MT SOLN: 15.0000 mL | Freq: Once | OROMUCOSAL | Status: DC

## 2023-01-03 MED ORDER — AMISULPRIDE (ANTIEMETIC) 5 MG/2ML IV SOLN: 10.0000 mg | Freq: Once | INTRAVENOUS | Status: DC | PRN

## 2023-01-03 MED ORDER — ACETAMINOPHEN 650 MG RE SUPP: 650.0000 mg | RECTAL | Status: DC | PRN

## 2023-01-03 MED ORDER — IOHEXOL 300 MG/ML  SOLN
150.0000 mL | Freq: Once | INTRAMUSCULAR | Status: AC | PRN
  Administered 2023-01-03: 75 mL via INTRA_ARTERIAL

## 2023-01-03 MED ORDER — PROPOFOL 10 MG/ML IV BOLUS
INTRAVENOUS | Status: DC | PRN
  Administered 2023-01-03: 20 mg via INTRAVENOUS

## 2023-01-03 MED ORDER — CHLORHEXIDINE GLUCONATE 0.12 % MT SOLN
15.0000 mL | Freq: Once | OROMUCOSAL | Status: AC
  Administered 2023-01-03: 15 mL via OROMUCOSAL

## 2023-01-03 MED ORDER — OXYCODONE HCL 5 MG/5ML PO SOLN: 5.0000 mg | Freq: Once | ORAL | Status: DC | PRN

## 2023-01-03 MED ORDER — FENTANYL CITRATE (PF) 100 MCG/2ML IJ SOLN: 25.0000 ug | INTRAMUSCULAR | Status: DC | PRN

## 2023-01-03 MED ORDER — SODIUM CHLORIDE 0.9 % IV SOLN: INTRAVENOUS | Status: DC

## 2023-01-03 MED ORDER — ORAL CARE MOUTH RINSE: 15.0000 mL | Freq: Once | OROMUCOSAL | Status: AC

## 2023-01-03 MED ORDER — EPHEDRINE SULFATE-NACL 50-0.9 MG/10ML-% IV SOSY
PREFILLED_SYRINGE | INTRAVENOUS | Status: DC | PRN
  Administered 2023-01-03 (×2): 10 mg via INTRAVENOUS
  Administered 2023-01-03: 5 mg via INTRAVENOUS
  Administered 2023-01-03: 10 mg via INTRAVENOUS

## 2023-01-03 MED ORDER — HEPARIN SODIUM (PORCINE) 1000 UNIT/ML IJ SOLN
INTRAMUSCULAR | Status: DC | PRN
  Administered 2023-01-03: 5000 [IU] via INTRAVENOUS

## 2023-01-03 MED ORDER — LIDOCAINE HCL 1 % IJ SOLN
INTRAMUSCULAR | Status: AC
  Filled 2023-01-03: qty 20

## 2023-01-03 NOTE — Anesthesia Preprocedure Evaluation (Addendum)
Anesthesia Evaluation  Patient identified by MRN, date of birth, ID band Patient awake    Reviewed: Allergy & Precautions, NPO status , Patient's Chart, lab work & pertinent test results  History of Anesthesia Complications (+) PONV  Airway Mallampati: II  TM Distance: >3 FB Neck ROM: Full    Dental  (+) Missing, Dental Advisory Given 2 teeth remaining:   Pulmonary COPD, Recent URI  (recent flu), former smoker   breath sounds clear to auscultation       Cardiovascular hypertension, + Peripheral Vascular Disease (AAA s/p repair and stent graft)   Rhythm:Regular Rate:Normal  12/28/2022 ECHO: EF 60-65%, normal LVF, normal RVF, no significant valvular abnormalities   Neuro/Psych 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), Residual Symptoms    GI/Hepatic Neg liver ROS, hiatal hernia, PUD,GERD  Medicated and Controlled,,  Endo/Other  diabetes, Type 2, Oral Hypoglycemic AgentsHypothyroidism    Renal/GU Renal InsufficiencyRenal disease     Musculoskeletal  (+) Arthritis , Rheumatoid disorders,    Abdominal   Peds  Hematology  (+) Blood dyscrasia (Hgb 9.1), anemia   Anesthesia Other Findings   Reproductive/Obstetrics                             Anesthesia Physical Anesthesia Plan  ASA: 4  Anesthesia Plan: General   Post-op Pain Management: Minimal or no pain anticipated   Induction: Intravenous  PONV Risk Score and Plan: 3 and Treatment may vary due to age or medical condition, Ondansetron and Dexamethasone  Airway Management Planned: Oral ETT  Additional Equipment: Arterial line  Intra-op Plan:   Post-operative Plan: Extubation in OR  Informed Consent: I have reviewed the patients History and Physical, chart, labs and discussed the procedure including the risks, benefits and alternatives for the proposed anesthesia with the patient or authorized representative who has indicated his/her  understanding and acceptance.     Dental advisory given  Plan Discussed with: CRNA and Surgeon  Anesthesia Plan Comments: (Radiologist requests MAC, so plan altered to accommodate.  Pt accepts)        Anesthesia Quick Evaluation

## 2023-01-03 NOTE — TOC Transition Note (Signed)
Discharge medications (3) have been retrieved from Main Pharmacy weekend lockup and are now being stored in the Transitions of Care (TOC) Pharmacy on the second floor until patient is ready for discharge.   

## 2023-01-03 NOTE — Transfer of Care (Signed)
Immediate Anesthesia Transfer of Care Note  Patient: [name]  Procedure(s) Performed: IR WITH ANESTHESIA  Patient Location: PACU  Anesthesia Type:MAC  Level of Consciousness: awake, alert , and oriented  Airway & Oxygen Therapy: Patient Spontanous Breathing  Post-op Assessment: Report given to RN  Post vital signs: Reviewed and stable  Last Vitals:  Vitals Value Taken Time  BP 125/61 01/03/23 1615  Temp    Pulse 76 01/03/23 1614  Resp 19 01/03/23 1615  SpO2 97 % 01/03/23 1614  Vitals shown include unvalidated device data.  Last Pain:  Vitals:   01/03/23 1124  TempSrc:   PainSc: 0-No pain         Complications: No notable events documented.

## 2023-01-03 NOTE — H&P (Addendum)
Chief Complaint: Symptomatic intracranial or ICA stenosis. Request is for cerebral angiogram with intervention.   Referring Physician(s): Dr. Despina Hidden  Supervising Physician: Pedro Earls  Patient Status: [facility] - In-pt  History of Present Illness: [name] is a 77 y.o. male inpatient. History of RA, interstitial lung disease, DVT/PE, DM, HTN. HLD. Esophageal outlet obstruction s/p dilations and GJ tube placement. AAA s/p EVAC.  Presented to the ED at [facility] on 1.1.24 with right wrist drop, no right hand movement and fever 101.6. Code Stroke was called. Patient was found to have a left ICA stenosis with ulcerated plaque, sepsis with acute encephalopathy and influenza. MRI performed at that time showed left MCA territory infarct.  He was placed on dual antiplatelet therapy with aspirin and Plavix and had planned discharge with outpatient follow-up.  However, on 12/31/2022, he had acute onset of marked right-sided weakness and aphasia with right arm weakness worse than on initial presentation.  CT angiogram of the head and neck showed similar appearance of left carotid disease.  MRI of the brain showed minimal progression of the left MCA territory.  Due to recurrent symptomatic left ICA stenosis with ulcerated plaque, decision made to proceed with left carotid stenting after discussing option with neurology and primary team.  Patient alert to person and place and laying in bed,calm. Endorses right wrist weakness that he states is improving with exercise.  Denies any fevers, headache, chest pain, abdominal pain, nausea, vomiting or bleeding. Return precautions and treatment recommendations and follow-up discussed with the patient  who is agreeable with the plan.    Past Medical History:  Diagnosis Date   Aortic aneurysm ([place]) 05/2017   Chronic back pain    Diabetes mellitus without complication (HCC)    Dyslipidemia    GERD (gastroesophageal reflux disease)    H/O  hiatal hernia    Hypertension    Melanoma ([place])    "back; left arm"   PONV (postoperative nausea and vomiting)    Rheumatoid arthritis ([place])    Spasm of esophagus     Past Surgical History:  Procedure Laterality Date   aneursym repair     aneursym repair and stent placement  05/2017   BIOPSY  08/19/2022   Procedure: BIOPSY;  Surgeon: Ladene Artist, MD;  Location: [facility] ENDOSCOPY;  Service: Gastroenterology;;   CARPAL TUNNEL RELEASE Bilateral    COLON SURGERY  2007   removed 18" of colon   ERCP W/ METAL STENT PLACEMENT  11/2005   Archie Endo 12/14/2005   ESOPHAGOGASTRODUODENOSCOPY (EGD) WITH ESOPHAGEAL DILATION     "several times'   ESOPHAGOGASTRODUODENOSCOPY (EGD) WITH PROPOFOL N/A 08/19/2022   Procedure: ESOPHAGOGASTRODUODENOSCOPY (EGD) WITH PROPOFOL;  Surgeon: Ladene Artist, MD;  Location: [place];  Service: Gastroenterology;  Laterality: N/A;   HAND SURGERY Bilateral    "plastic knuckles"   LAPAROSCOPIC CHOLECYSTECTOMY  01/2006   MELANOMA EXCISION     "back; left arm"   NASAL SEPTUM SURGERY     PELVIC FRACTURE SURGERY  1964   "busted in 2 places"   RESECTION DISTAL CLAVICAL Right 05/13/2016   Procedure: DISTAL CLAVICLE EXCISION;  Surgeon: Ninetta Lights, MD;  Location: [place];  Service: Orthopedics;  Laterality: Right;   SHOULDER ARTHROSCOPY WITH ROTATOR CUFF REPAIR AND SUBACROMIAL DECOMPRESSION Right 05/13/2016   Procedure: RIGHT SHOULDER SCOPE DEBRIDEMENT, ACROMIOPLASTY, ROTATOR CUFF REPAIR; RELEASE BICEPS TENDON AND DEBRIDEMENT LABRUM;  Surgeon: Ninetta Lights, MD;  Location: [place];  Service: Orthopedics;  Laterality: Right;   TIBIA FRACTURE SURGERY Left 2006   "put a steel rod in it"    Allergies: Patient has no known allergies.  Medications: Prior to Admission medications   Medication Sig Start Date End Date Taking? Authorizing Provider  abatacept (ORENCIA) 250 MG injection Inject 250 mg into the vein every 30 (thirty)  days. Patient taking differently: Inject 750 mg into the vein every 30 (thirty) days. 02/12/21  Yes Sanjuan Dame, MD  atorvastatin (LIPITOR) 40 MG tablet Take 1 tablet (40 mg total) by mouth daily. 08/27/22  Yes Leigh Aurora, DO  famotidine (PEPCID) 20 MG tablet Take 1 tablet (20 mg total) by mouth daily. 08/27/22 08/27/23 Yes Leigh Aurora, DO  Feeding Supplies KIT 1 Box by Does not apply route daily. Patient will need pump and supplies for continuous tube feeds and 87m syringes for free water flushes 12/31/22  Yes Mapp, Tavien, MD  ferrous sulfate 300 (60 Fe) MG/5ML syrup Take 5 mLs (300 mg total) by mouth daily. 08/27/22  Yes PLeigh Aurora DO  folic acid (FOLVITE) 1 MG tablet Take 1 tablet (1 mg total) by mouth daily. 08/27/22  Yes PLeigh Aurora DO  levothyroxine (SYNTHROID) 50 MCG tablet Take 1 tablet (50 mcg total) by mouth daily. 08/27/22  Yes PLeigh Aurora DO  magnesium oxide (MAG-OX) 400 (240 Mg) MG tablet Take 1 tablet (400 mg total) by mouth daily. 08/27/22  Yes PLeigh Aurora DO  metFORMIN (GLUCOPHAGE) 500 MG tablet Take 1 tablet (500 mg total) by mouth daily. 08/27/22  Yes PLeigh Aurora DO  Methotrexate 25 MG/ML SOSY Inject 15 mg into the skin once a week.   Yes [provider]  ondansetron (ZOFRAN) 4 MG tablet Take 1 tablet (4 mg total) by mouth every 12 (twelve) hours as needed for nausea or vomiting. 08/27/22  Yes PLeigh Aurora DO  oxyCODONE (OXY IR/ROXICODONE) 5 MG immediate release tablet Take 1 tablet (5 mg total) by mouth every 4 (four) hours as needed for moderate pain. 08/27/22  Yes CRick Duff MD  pantoprazole (PROTONIX) 40 MG tablet Take 1 tablet (40 mg total) by mouth 2 (two) times daily. Via tube 08/27/22  Yes CRick Duff MD  predniSONE (DELTASONE) 1 MG tablet Take 1-5 tablets (1-5 mg total) by mouth See admin instructions. 5 mg daily for 1 month  4 mg daily for 1 month, 3 mg daily for 1 month, 2 mg daily for 1 month, 1 mg daily for 1 month. 08/27/22  Yes Patel, Amar, DO   sucralfate (CARAFATE) 1 GM/10ML suspension Take 1 g by mouth 4 (four) times daily -  before meals and at bedtime.   Yes [provider]  tadalafil (CIALIS) 5 MG tablet Take 2 tablets (10 mg total) by mouth daily. 08/27/22  Yes PLeigh Aurora DO  alum & mag hydroxide-simeth (MAALOX/MYLANTA) 200-200-20 MG/5ML suspension Take 30 mLs by mouth 3 (three) times daily as needed for indigestion or heartburn. Patient not taking: Reported on 12/27/2022 08/27/22   CRick Duff MD  aspirin 81 MG chewable tablet Place 1 tablet (81 mg total) into feeding tube daily. 01/01/23   Mapp, TClaudia Desanctis MD  clopidogrel (PLAVIX) 75 MG tablet Place 1 tablet (75 mg total) into feeding tube daily. 01/01/23   Mapp, TClaudia Desanctis MD  diclofenac Sodium (VOLTAREN) 1 % GEL Apply 2 g topically 4 (four) times daily. Patient not taking: Reported on 12/28/2022 08/27/22   CRick Duff MD  enoxaparin (LOVENOX) 40 MG/0.4ML injection Inject 40 mg into the skin daily. Patient  not taking: Reported on 12/28/2022 08/06/22   [provider]  lidocaine (XYLOCAINE) 2 % solution Use as directed 15 mLs in the mouth or throat 3 (three) times daily as needed for mouth pain (Indigestion). Patient not taking: Reported on 12/28/2022 08/27/22   Rick Duff, MD  Nutritional Supplements (FEEDING SUPPLEMENT, OSMOLITE 1.5 CAL,) LIQD Place 1,000 mLs into feeding tube continuous. For 18 hours daily. 12/31/22   Mapp, Claudia Desanctis, MD  predniSONE (DELTASONE) 1 MG tablet Place 1 tablet (1 mg total) into feeding tube daily with breakfast. 01/01/23   Mapp, Claudia Desanctis, MD  Water For Irrigation, Sterile (FREE WATER) SOLN Place 200 mLs into feeding tube every 4 (four) hours. 12/31/22   Mapp, Claudia Desanctis, MD     Family History  Problem Relation Age of Onset   Heart disease Mother    Heart disease Father    Cancer Maternal Grandmother     Social History   Socioeconomic History   Marital status: Divorced    Spouse name: Not on file   Number of children: Not on file   Years  of education: Not on file   Highest education level: Not on file  Occupational History   Not on file  Tobacco Use   Smoking status: Former    Packs/day: 1.00    Years: 46.00    Total pack years: 46.00    Types: Cigarettes    Quit date: 11/26/2005    Years since quitting: 17.1   Smokeless tobacco: Never  Substance and Sexual Activity   Alcohol use: Yes    Alcohol/week: 9.0 standard drinks of alcohol    Types: 9 Shots of liquor per week    Comment: 05/23/2014 "3, 1 shot drinks maybe 3 times/wk"   Drug use: No   Sexual activity: Not Currently  Other Topics Concern   Not on file  Social History Narrative   Not on file   Social Determinants of Health   Financial Resource Strain: Not on file  Food Insecurity: No Food Insecurity (12/28/2022)   Hunger Vital Sign    Worried About Running Out of Food in the Last Year: Never true    Ran Out of Food in the Last Year: Never true  Transportation Needs: No Transportation Needs (12/28/2022)   PRAPARE - Hydrologist (Medical): No    Lack of Transportation (Non-Medical): No  Physical Activity: Not on file  Stress: Not on file  Social Connections: Not on file     Review of Systems: A 12 point ROS discussed and pertinent positives are indicated in the HPI above.  All other systems are negative.  Review of Systems  Constitutional:  Negative for fever.  HENT:  Negative for congestion.   Respiratory:  Negative for cough and shortness of breath.   Cardiovascular:  Negative for chest pain.  Gastrointestinal:  Negative for abdominal pain.  Neurological:  Negative for headaches.       Right wrist weakness. Patient states that he has been doing exercises and seen improvement  Psychiatric/Behavioral:  Negative for behavioral problems and confusion.     Vital Signs: BP (!) 143/58 (BP Location: Left Arm)   Pulse (!) 54   Temp 97.9 F (36.6 C) (Oral)   Resp 18   Ht '6\' 1"'$  (1.854 m)   Wt 163 lb 2.3 oz (74 kg)   SpO2  99%   BMI 21.52 kg/m   Physical Exam Vitals and nursing note reviewed.  Constitutional:  Appearance: He is well-developed. He is ill-appearing.  HENT:     Head: Normocephalic.  Cardiovascular:     Rate and Rhythm: Normal rate and regular rhythm.     Heart sounds: Normal heart sounds.  Pulmonary:     Effort: Pulmonary effort is normal.  Musculoskeletal:        General: Normal range of motion.     Cervical back: Normal range of motion.  Skin:    General: Skin is dry.  Neurological:     Mental Status: He is alert.     Comments: Alert, aware and oriented X 2 Mild dysarthria.   No facial droop noted Tongue midline Right upper arm weakness. Can spontaneously move all other extremities. Speech, cognition and language  intact.  Comprehension and fluency are normal.  Judgment and insight normal Negative pronator drift. Gait not assessed Romberg not assessed Heel to toe not assessed Distal pulses not assessed      Imaging: MR BRAIN WO CONTRAST  Result Date: 01/01/2023 CLINICAL DATA:  Neuro deficit, acute, stroke suspected neurological deficits. Right-sided weakness and aphasia. EXAM: MRI HEAD WITHOUT CONTRAST TECHNIQUE: Multiplanar, multiecho pulse sequences of the brain and surrounding structures were obtained without intravenous contrast. COMPARISON:  Head CT and CTA 12/31/2022.  Head MRI 12/27/2022. FINDINGS: Brain: Small recent cortical and subcortical infarcts are again seen in the left cerebral hemisphere involving the posterior frontal lobe, parietal lobe, occipital lobe, and posterior temporal lobe. One or two of the punctate infarcts are more conspicuous on the prior MRI, however no sizable new infarct or significant infarct extension is evident. A punctate infarct in the left corona radiata is also unchanged. A chronic microhemorrhage in the right cerebellum is unchanged. A small chronic left parietal and right thalamic infarcts are again noted. T2 hyperintensities  elsewhere in the cerebral white matter bilaterally are unchanged and nonspecific but compatible with mild chronic small vessel ischemic disease. There is mild cerebral atrophy. Vascular: Major intracranial vascular flow voids are preserved. Skull and upper cervical spine: Unremarkable bone marrow signal. Sinuses/Orbits: Bilateral cataract extraction. Mild-to-moderate mucosal thickening and fluid in the paranasal sinuses. Small lateral mastoid effusions. Other: None. IMPRESSION: Multiple known small subacute left cerebral infarcts without definite new infarct or extension. Electronically Signed   By: Logan Bores M.D.   On: 01/01/2023 15:39   DG CHEST PORT 1 VIEW  Result Date: 01/01/2023 CLINICAL DATA:  Aspiration. EXAM: PORTABLE CHEST 1 VIEW COMPARISON:  December 27, 2022 FINDINGS: Skin folds overlie the upper chest, right greater than left. No pneumothorax identified. The cardiomediastinal silhouette is stable. Mild bibasilar opacities, not identified previously. No other acute abnormalities. IMPRESSION: Mild bibasilar opacities could represent aspiration given history. Recommend short-term follow-up imaging to ensure resolution. Electronically Signed   By: Dorise Bullion III M.D.   On: 01/01/2023 10:36   CT ANGIO HEAD NECK W WO CM (CODE STROKE)  Result Date: 12/31/2022 CLINICAL DATA:  Neuro deficit, acute, stroke suspected. Right-sided weakness and aphasia. EXAM: CT ANGIOGRAPHY HEAD AND NECK TECHNIQUE: Multidetector CT imaging of the head and neck was performed using the standard protocol during bolus administration of intravenous contrast. Multiplanar CT image reconstructions and MIPs were obtained to evaluate the vascular anatomy. Carotid stenosis measurements (when applicable) are obtained utilizing NASCET criteria, using the distal internal carotid diameter as the denominator. RADIATION DOSE REDUCTION: This exam was performed according to the departmental dose-optimization program which includes automated  exposure control, adjustment of the mA and/or kV according to patient size and/or use of  iterative reconstruction technique. CONTRAST:  55m OMNIPAQUE IOHEXOL 350 MG/ML SOLN COMPARISON:  Head and neck CTA 12/27/2022 FINDINGS: CTA NECK FINDINGS Aortic arch: Standard 3 vessel aortic arch with extensive calcified and soft plaque. Diffuse atherosclerotic irregularity of the brachiocephalic and subclavian arteries without a significant stenosis. Right carotid system: Patent with similar appearance of widespread calcified and soft plaque throughout the common carotid artery and more prominent calcified plaque at the carotid bifurcation resulting in 50% stenosis of the ICA origin. Left carotid system: Patent with similar appearance of widespread calcified and soft plaque throughout the common carotid artery and more extensive soft and calcified plaque in the carotid bulb resulting in 60% stenosis of the proximal ICA. Similar appearance of ulcerated plaque the carotid bulb. Vertebral arteries: Patent with the left being mildly dominant. Calcified plaque at the vertebral origins resulting in unchanged severe stenosis on the right and mild to moderate stenosis on the left. Skeleton: Advanced disc and facet degeneration in the cervical spine. Other neck: No evidence of cervical lymphadenopathy or mass. Upper chest: Patulous esophagus containing fluid. Biapical lung scarring. Review of the MIP images confirms the above findings CTA HEAD FINDINGS Anterior circulation: The internal carotid arteries are patent from skull base to carotid termini with calcified plaque resulting in unchanged moderate left and at most mild right cavernous segment stenosis. ACAs and MCAs are patent without evidence of a proximal branch occlusion or significant proximal stenosis. No aneurysm is identified. Posterior circulation: The intracranial vertebral arteries are patent to the basilar with unchanged mild stenosis on the left. Patent PICA and SCA  origins are seen bilaterally. The basilar artery is widely patent. Posterior communicating arteries are diminutive or absent. Both PCAs are patent without evidence of significant proximal stenosis. No aneurysm is identified. Venous sinuses: As permitted by contrast timing, patent. Anatomic variants: None. Review of the MIP images confirms the above findings Preliminary finding of no emergent large vessel occlusion was communicated to Dr. BCurly Shoresat 5:35 p.m. on 12/31/2022 by text page via the [facility] system. IMPRESSION: 1. No large vessel occlusion. 2. Unchanged advanced atherosclerosis in the head and neck. 3. Moderate left cavernous ICA stenosis. 4. 50% right and 60% left proximal ICA stenoses with ulcerated plaque on the left. 5. Severe right and mild-to-moderate left vertebral artery origin stenoses. 6.  Aortic Atherosclerosis (ICD10-I70.0). Electronically Signed   By: ALogan BoresM.D.   On: 12/31/2022 18:59   CT HEAD CODE STROKE WO CONTRAST  Result Date: 12/31/2022 CLINICAL DATA:  Code stroke. Neuro deficit, acute, stroke suspected. Slurred speech. EXAM: CT HEAD WITHOUT CONTRAST TECHNIQUE: Contiguous axial images were obtained from the base of the skull through the vertex without intravenous contrast. RADIATION DOSE REDUCTION: This exam was performed according to the departmental dose-optimization program which includes automated exposure control, adjustment of the mA and/or kV according to patient size and/or use of iterative reconstruction technique. COMPARISON:  Head CT and MRI 12/27/2022 FINDINGS: Brain: A small subacute infarct is visible in the left precentral gyrus, however the other small left cerebral infarcts on the recent prior MRI are largely occult by CT. No definite new infarct, intracranial hemorrhage, mass, midline shift, or extra-axial fluid collection is identified. There is mild cerebral atrophy. Background mild chronic small vessel ischemia is noted in the cerebral white matter.  Vascular: Calcified atherosclerosis at the skull base. No hyperdense vessel. Skull: No acute fracture or suspicious osseous lesion. Sinuses/Orbits: Mild-to-moderate mucosal thickening and scattered fluid in the paranasal sinuses. Trace left mastoid effusion. Bilateral  cataract extraction. Other: None. ASPECTS [facility] Stroke Program Early CT Score) Not scored given the presence of recent MCA infarcts. IMPRESSION: 1. Known small subacute left cerebral infarcts. 2. No definite new infarct or intracranial hemorrhage. These results were communicated to Dr. Curly Shores at 5:35 pm on 12/31/2022 by text page via the [facility] messaging system. Electronically Signed   By: Logan Bores M.D.   On: 12/31/2022 17:36   VAS US CAROTID  Result Date: 12/29/2022 Carotid Arterial Duplex Study Patient Name:  BRENEN BEIGEL  Date of Exam:   12/29/2022 Medical Rec #: [id number]           Accession #:    [id number] Date of Birth: February 04, 1946          Patient Gender: M Patient Age:   25 years Exam Location:  [facility] Procedure:      VAS US CAROTID Referring Phys: PRAMOD SETHI --------------------------------------------------------------------------------  Indications:       CVA. Risk Factors:      Hypertension, Diabetes. Comparison Study:  no prior Performing Technologist: Archie Patten RVS  Examination Guidelines: A complete evaluation includes B-mode imaging, spectral Doppler, color Doppler, and power Doppler as needed of all accessible portions of each vessel. Bilateral testing is considered an integral part of a complete examination. Limited examinations for reoccurring indications may be performed as noted.  Right Carotid Findings: +----------+--------+--------+--------+-------------------------+--------+           PSV cm/sEDV cm/sStenosisPlaque Description       Comments +----------+--------+--------+--------+-------------------------+--------+ CCA Prox  60                      heterogenous and calcific          +----------+--------+--------+--------+-------------------------+--------+ CCA Distal50      9               heterogenous and calcific         +----------+--------+--------+--------+-------------------------+--------+ ICA Prox  153     34      1-39%   heterogenous                      +----------+--------+--------+--------+-------------------------+--------+ ICA Distal96      27                                                +----------+--------+--------+--------+-------------------------+--------+ ECA       301     17                                                +----------+--------+--------+--------+-------------------------+--------+ +----------+--------+-------+--------+-------------------+           PSV cm/sEDV cmsDescribeArm Pressure (mmHG) +----------+--------+-------+--------+-------------------+ [id number]                                        +----------+--------+-------+--------+-------------------+ +---------+--------+--+--------+-+---------+ VertebralPSV cm/s42EDV cm/s8Antegrade +---------+--------+--+--------+-+---------+  Left Carotid Findings: +---------+--------+-------+--------+---------------------------------+--------+          PSV cm/sEDV    StenosisPlaque Description               Comments  cm/s                                                     +---------+--------+-------+--------+---------------------------------+--------+ CCA Prox 103     13             heterogenous and calcific                 +---------+--------+-------+--------+---------------------------------+--------+ CCA      72      14             heterogenous                              Distal                                                                    +---------+--------+-------+--------+---------------------------------+--------+ ICA Prox 270     56     40-59%  heterogenous, calcific and                                                 irregular                                 +---------+--------+-------+--------+---------------------------------+--------+ ICA Mid  139     28                                                       +---------+--------+-------+--------+---------------------------------+--------+ ICA      85      21                                                       Distal                                                                    +---------+--------+-------+--------+---------------------------------+--------+ ECA      178     24                                                       +---------+--------+-------+--------+---------------------------------+--------+ +----------+--------+--------+--------+-------------------+           PSV cm/sEDV cm/sDescribeArm Pressure (mmHG) +----------+--------+--------+--------+-------------------+  [id number]                                         +----------+--------+--------+--------+-------------------+ +---------+--------+--+--------+--+---------+ VertebralPSV cm/s43EDV cm/s15Antegrade +---------+--------+--+--------+--+---------+   Summary: Right Carotid: Velocities in the right ICA are consistent with a 1-39% stenosis. Left Carotid: Velocities in the left ICA are consistent with a 40-59% stenosis. Vertebrals: Bilateral vertebral arteries demonstrate antegrade flow. *See table(s) above for measurements and observations.  Electronically signed by Orlie Pollen on 12/29/2022 at 8:18:31 PM.    Final    ECHOCARDIOGRAM COMPLETE  Result Date: 12/28/2022    ECHOCARDIOGRAM REPORT   Patient Name:   SHAQUILL ISEMAN Date of Exam: 12/28/2022 Medical Rec #:  [id number]          Height:       73.0 in Accession #:    [id number]         Weight:       173.3 lb Date of Birth:  18-Feb-1946         BSA:          2.024 m Patient Age:    65 years           BP:           131/72 mmHg Patient Gender: M                  HR:            69 bpm. Exam Location:  Inpatient Procedure: 2D Echo, Cardiac Doppler and Color Doppler Indications:    Stroke I63.9  History:        Patient has prior history of Echocardiogram examinations, most                 recent 08/16/2012. Signs/Symptoms:Dyspnea; Risk                 Factors:Hypertension and Diabetes.  Sonographer:    Ronny Flurry Referring Phys: [id number] [place]  1. Left ventricular ejection fraction, by estimation, is 60 to 65%. The left ventricle has normal function. The left ventricle has no regional wall motion abnormalities. Left ventricular diastolic parameters were normal.  2. Right ventricular systolic function is normal. The right ventricular size is normal.  3. The mitral valve is normal in structure. No evidence of mitral valve regurgitation. No evidence of mitral stenosis. Moderate mitral annular calcification.  4. The aortic valve is tricuspid. There is mild calcification of the aortic valve. Aortic valve regurgitation is not visualized. Aortic valve sclerosis is present, with no evidence of aortic valve stenosis.  5. The inferior vena cava is dilated in size with <50% respiratory variability, suggesting right atrial pressure of 15 mmHg. FINDINGS  Left Ventricle: Left ventricular ejection fraction, by estimation, is 60 to 65%. The left ventricle has normal function. The left ventricle has no regional wall motion abnormalities. The left ventricular internal cavity size was normal in size. There is  no left ventricular hypertrophy. Left ventricular diastolic parameters were normal. Normal left ventricular filling pressure. Right Ventricle: The right ventricular size is normal. No increase in right ventricular wall thickness. Right ventricular systolic function is normal. Left Atrium: Left atrial size was normal in size. Right Atrium: Right atrial size was normal in size. Pericardium: There is no evidence of pericardial effusion. Mitral Valve: The mitral valve is normal in  structure. Moderate mitral annular calcification. No evidence of  mitral valve regurgitation. No evidence of mitral valve stenosis. Tricuspid Valve: The tricuspid valve is normal in structure. Tricuspid valve regurgitation is not demonstrated. No evidence of tricuspid stenosis. Aortic Valve: The aortic valve is tricuspid. There is mild calcification of the aortic valve. Aortic valve regurgitation is not visualized. Aortic valve sclerosis is present, with no evidence of aortic valve stenosis. Aortic valve mean gradient measures 3.0 mmHg. Aortic valve peak gradient measures 5.7 mmHg. Aortic valve area, by VTI measures 2.99 cm. Pulmonic Valve: The pulmonic valve was normal in structure. Pulmonic valve regurgitation is not visualized. No evidence of pulmonic stenosis. Aorta: The aortic root is normal in size and structure. Venous: The inferior vena cava is dilated in size with less than 50% respiratory variability, suggesting right atrial pressure of 15 mmHg. IAS/Shunts: No atrial level shunt detected by color flow Doppler.  LEFT VENTRICLE PLAX 2D LVIDd:         5.10 cm   Diastology LVIDs:         3.70 cm   LV e' medial:    9.46 cm/s LV PW:         1.00 cm   LV E/e' medial:  9.7 LV IVS:        0.90 cm   LV e' lateral:   10.80 cm/s LVOT diam:     2.00 cm   LV E/e' lateral: 8.5 LV SV:         80 LV SV Index:   40 LVOT Area:     3.14 cm  RIGHT VENTRICLE            IVC RV S prime:     8.81 cm/s  IVC diam: 2.40 cm TAPSE (M-mode): 2.2 cm LEFT ATRIUM             Index        RIGHT ATRIUM           Index LA diam:        3.80 cm 1.88 cm/m   RA Area:     18.30 cm LA Vol (A2C):   61.9 ml 30.58 ml/m  RA Volume:   48.90 ml  24.16 ml/m LA Vol (A4C):   48.1 ml 23.76 ml/m LA Biplane Vol: 60.6 ml 29.93 ml/m  AORTIC VALVE AV Area (Vmax):    2.57 cm AV Area (Vmean):   2.76 cm AV Area (VTI):     2.99 cm AV Vmax:           119.50 cm/s AV Vmean:          80.250 cm/s AV VTI:            0.269 m AV Peak Grad:      5.7 mmHg AV Mean  Grad:      3.0 mmHg LVOT Vmax:         97.70 cm/s LVOT Vmean:        70.600 cm/s LVOT VTI:          0.256 m LVOT/AV VTI ratio: 0.95  AORTA Ao Root diam: 3.70 cm Ao Asc diam:  3.50 cm MITRAL VALVE MV Area (PHT): 3.84 cm    SHUNTS MV Decel Time: 198 msec    Systemic VTI:  0.26 m MV E velocity: 91.50 cm/s  Systemic Diam: 2.00 cm MV A velocity: 93.55 cm/s MV E/A ratio:  0.98 Mihai Croitoru MD Electronically signed by Sanda Klein MD Signature Date/Time: 12/28/2022/9:18:28 AM    Final    MR BRAIN W  WO CONTRAST  Result Date: 12/27/2022 CLINICAL DATA:  Code stroke. Patient found on floor this morning. Abnormal speech. Right arm weakness. EXAM: MRI HEAD WITHOUT AND WITH CONTRAST TECHNIQUE: Multiplanar, multiecho pulse sequences of the brain and surrounding structures were obtained without and with intravenous contrast. CONTRAST:  7.80m GADAVIST GADOBUTROL 1 MMOL/ML IV SOLN COMPARISON:  CT head and CTA head and neck 12/27/2022 FINDINGS: Brain: Diffusion-weighted images demonstrate scattered foci of restricted diffusion involving the high left frontal lobe. There is some involvement of the primary motor cortex. Scattered punctate areas are present in the high left parietal lobe as well. A single focus scratched at a single punctate focus of restricted diffusion is present in adjacent to the left lateral ventricle on image 83 of the axial diffusion sequence. More remote cortical infarcts are present in the posteromedial left parietal lobe FLAIR signal is associated with the areas of acute infarction. Confluent periventricular T2 hyperintensities are present bilaterally. The ventricles are proportionate to the degree of atrophy. Deep brain nuclei are within normal limits. Remote lacunar infarcts are present in the thalami bilaterally. The internal auditory canals are within normal limits. The brainstem and cerebellum are within normal limits. Postcontrast images demonstrate no pathologic enhancement. Vascular: Flow is  present in the major intracranial arteries. Skull and upper cervical spine: The craniocervical junction is normal. Upper cervical spine is within normal limits. Marrow signal is unremarkable. Sinuses/Orbits: Mild mucosal thickening is present in the inferior left maxillary sinus. Anterior left ethmoid air cells are opacified. Minimal fluid is present in the sphenoid sinuses. Small mastoid effusions are present. Bilateral lens replacements are noted. Globes and orbits are otherwise unremarkable. IMPRESSION: 1. Scattered foci of restricted diffusion involving the high left frontal lobe and high left parietal lobe compatible with acute/subacute nonhemorrhagic infarcts. 2. 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 3. Remote lacunar infarcts of the thalami bilaterally. 4. Confluent periventricular T2 hyperintensities bilaterally likely reflect the sequela of chronic microvascular ischemia. 5. Mild sinus disease. These results were called by telephone at the time of interpretation on 12/27/2022 at 3:00 pm [facility] who verbally acknowledged these results. Electronically Signed   By: CSan MorelleM.D.   On: 12/27/2022 15:00   DG Chest Port 1 View  Result Date: 12/27/2022 CLINICAL DATA:  Sepsis EXAM: PORTABLE CHEST 1 VIEW COMPARISON:  01/26/2021 FINDINGS: Pulmonary vascular congestion noted. No focal consolidation. There may be small left-sided pleural effusion. No pneumothorax identified. Aorta is calcified. IMPRESSION: Pulmonary vascular congestion without focal consolidation. Possible small left-sided pleural effusion. Electronically Signed   By: JSammie BenchM.D.   On: 12/27/2022 11:22   CT ANGIO HEAD NECK W WO CM W PERF (CODE STROKE)  Result Date: 12/27/2022 CLINICAL DATA:  77year old male code stroke presentation. EXAM: CT ANGIOGRAPHY HEAD AND NECK CT PERFUSION BRAIN TECHNIQUE: Multidetector CT imaging of the head and neck was performed using the  standard protocol during bolus administration of intravenous contrast. Multiplanar CT image reconstructions and MIPs were obtained to evaluate the vascular anatomy. Carotid stenosis measurements (when applicable) are obtained utilizing NASCET criteria, using the distal internal carotid diameter as the denominator. Multiphase CT imaging of the brain was performed following IV bolus contrast injection. Subsequent parametric perfusion maps were calculated using RAPID software. RADIATION DOSE REDUCTION: This exam was performed according to the departmental dose-optimization program which includes automated exposure control, adjustment of the mA and/or kV according to patient size and/or use of iterative reconstruction technique. CONTRAST:  1075m  OMNIPAQUE IOHEXOL 350 MG/ML SOLN COMPARISON:  Plain head CT 0943 hours today. Neck CT 01/24/2015. FINDINGS: CT Brain Perfusion Findings: ASPECTS: 10 CBF (<30%) Volume: 14m.  No CBF or CBV parameter abnormality. Perfusion (Tmax>6.0s) volume: 046mMismatch Volume: Not applicable Infarction Location:Not applicable CTA NECK Skeleton: Advanced cervical spine degeneration. No acute osseous abnormality identified. Upper chest: Retained food or debris in the thoracic esophagus which is only mildly dilated. Upper lung scarring. No superior mediastinal lymphadenopathy. Other neck: 2016 right level 2 lymphadenopathy does not persist. No neck mass identified. Aortic arch: Extensive Calcified aortic atherosclerosis. 3 vessel arch configuration. Right carotid system: Brachiocephalic artery and right CCA origin plaque without stenosis. Widespread right CCA plaque with no stenosis before the bifurcation. Bulky calcified plaque at the bifurcation and proximal right ICA but less than 50 % stenosis with respect to the distal vessel. Left carotid system: Similar widespread atherosclerosis. But more complex soft and calcified plaque at the left ICA origin and bulb resulting in bulb level stenosis  estimated at 60-65 % with respect to the distal vessel. Left ICA remains patent to the skull base. Vertebral arteries: Proximal right subclavian artery plaque without stenosis. Calcified plaque at the right vertebral artery origin with severe stenosis (series 11, image 160). But the vessel remains patent and is fairly codominant to the skull base. Extensive proximal left subclavian artery plaque. Calcified left vertebral artery origin and V1 segment with mild-to-moderate stenosis. Additional left V1 calcified plaque with only mild stenosis. The left vertebral artery is mildly dominant and patent to the skull base. CTA HEAD Posterior circulation: Non dominant right V4 segment is patent to the vertebrobasilar junction with normal PICA origin and no significant plaque or stenosis. Left V4 is mildly dominant with calcified plaque and mild to moderate stenosis on series 8, image 80 upstream of normal left PICA origin. Patent vertebrobasilar junction and basilar artery without stenosis. SCA and PCA origins are normal. Posterior communicating arteries are diminutive or absent. Bilateral PCA branches are within normal limits. Anterior circulation: Both ICA siphons are patent. However, there is bulky calcified siphon plaque bilaterally, greater on the left with associated severe left cavernous segment stenosis (series 8, image 61 and series 12, image 116. Contralateral right siphon calcified plaque with mild to moderate cavernous segment stenosis. Patent carotid termini, MCA and ACA origins. Normal anterior communicating artery. Bilateral ACA branches are within normal limits. Left MCA M1 segment and trifurcation are patent without stenosis. Right MCA M1 segment and trifurcation are patent without stenosis. Right MCA branches are within normal limits. Left MCA branches are patent with mild irregularity. No branch occlusion is identified. Venous sinuses: Early contrast timing, grossly patent. Anatomic variants: Dominant left  vertebral artery. Review of the MIP images confirms the above findings IMPRESSION: 1. Negative for large vessel occlusion, and negative CT Perfusion. 2. Positive for advanced atherosclerosis in the head and neck: - complex plaque at the left ICA origin and bulb with estimated 60-65% stenosis. - up to Severe Left ICA cavernous segment stenosis due to bulky calcified plaque. - up to Moderate right ICA siphon stenosis. - Severe Right Vertebral Artery origin stenosis. - up to Moderate Left Vertebral Artery stenosis. 3. Partially visible retained food or debris in the thoracic esophagus, but only mild esophageal dilatation favoring esophageal dysmotility rather than achalasia. Follow-up esophagram would best evaluate further. 4.  Aortic Atherosclerosis (ICD10-I70.0). CTP and Vascular findings were communicated to Dr. BhCurly Shorest 10:26 am on 12/27/2022 by text page via the [facility] system. Electronically  Signed   By: Genevie Ann M.D.   On: 12/27/2022 10:29   CT HEAD CODE STROKE WO CONTRAST  Result Date: 12/27/2022 CLINICAL DATA:  Code stroke.  77 year old male EXAM: CT HEAD WITHOUT CONTRAST TECHNIQUE: Contiguous axial images were obtained from the base of the skull through the vertex without intravenous contrast. RADIATION DOSE REDUCTION: This exam was performed according to the departmental dose-optimization program which includes automated exposure control, adjustment of the mA and/or kV according to patient size and/or use of iterative reconstruction technique. COMPARISON:  Head CT 08/19/2022. FINDINGS: Brain: Stable cerebral volume. No midline shift, ventriculomegaly, mass effect, evidence of mass lesion, intracranial hemorrhage or evidence of cortically based acute infarction. Gray-white matter differentiation appears stable since last year and largely normal for age throughout the brain. Vascular: Calcified atherosclerosis at the skull base. No suspicious intracranial vascular hyperdensity. Skull: No acute osseous  abnormality identified. Sinuses/Orbits: Chronic paranasal sinus disease and prior sinus surgery. Sinus aeration not significantly changed from August. Tympanic cavities and mastoids remain clear. Other: No acute orbit or scalp soft tissue finding. ASPECTS [facility] Stroke Program Early CT Score) Total score (0-10 with 10 being normal): 10 IMPRESSION: 1. Stable and normal for age noncontrast CT appearance of the brain. 2. ASPECTS 10. Electronically Signed   By: Genevie Ann M.D.   On: 12/27/2022 09:50    Labs:  CBC: Recent Labs    01/01/23 0559 01/01/23 1541 01/02/23 0240 01/03/23 0426  WBC 4.3 7.0 4.2 6.3  HGB 8.7* 8.7* 9.1* 9.1*  HCT 27.2* 26.4* 26.4* 26.8*  PLT 99* 96* 94* 108*    COAGS: Recent Labs    12/27/22 0946 01/01/23 1547  INR 1.2 1.2  APTT 33  --     BMP: Recent Labs    12/31/22 0526 01/01/23 0559 01/02/23 0240 01/03/23 0426  NA 132* 129* 133* 135  K 4.3 3.5 3.7 3.6  CL 105 101 101 104  CO2 19* 21* 23 23  GLUCOSE 149* 141* 268* 139*  BUN '22 20 19 21  '$ CALCIUM 7.6* 7.5* 8.1* 8.4*  CREATININE 0.79 0.76 0.85 0.65  GFRNONAA >60 >60 >60 >60    LIVER FUNCTION TESTS: Recent Labs    08/18/22 1418 12/27/22 0946 12/31/22 0526  BILITOT 0.5 0.6 0.4  AST 16 34 36  ALT '13 18 23  '$ ALKPHOS 66 84 50  PROT 5.9* 6.2* 4.9*  ALBUMIN 3.1* 3.2* 2.3*     Assessment and Plan:  77 y.o. male inpatient. History of RA, interstitial lung disease, DVT/PE, DM, HTN. HLD. Esophageal outlet obstruction s/p dilations and GJ tube placement. AAA s/p EVAC.  Presented to the ED at [facility] on 1.1.24 with right wrist drop, no right hand movement and fever 101.6. Code Stroke was called. Patient was found to have a left ICA stenosis, sepsis with acute encephalopathy and influenza. A second code stroke was called on 1.5.24 for acute worsening of right sided weakness and aphasia. CT Angio Head Neck from 1.5.24 reads  Unchanged advanced atherosclerosis in the head and neck. 3. Moderate left cavernous ICA  stenosis. 4. 50% right and 60% left proximal ICA stenoses with ulcerated plaque on the left. 5. Severe right and mild-to-moderate left vertebral artery origin stenoses. Hospital stay complicated by influenza.. Due to recurrent symptoms the Team is requesting Cerebral angiogram with intervention.   The patient is on ASA and Brilinta. All labs are within acceptable parameters.  Patient is on droplet precautions due to influenza. NKDA. Patient has been NPO since midnight.  Patient able to follow simple commands. Asking appropriate questions regarding risks of the procedure. Mild dysarthria and right wrist weakness noted.   Risks and benefits of  cerebral arteriogram with intervention were discussed with the patient including, but not limited to bleeding, infection, vascular injury, contrast induced renal failure, stroke, reperfusion hemorrhage, or even death. This interventional procedure involves the use of X-rays and because of the nature of the planned procedure, it is possible that we will have prolonged use of X-ray fluoroscopy. Potential radiation risks to you include (but are not limited to) the following: - A slightly elevated risk for cancer  several years later in life. This risk is typically less than 0.5% percent. This risk is low in comparison to the normal incidence of human cancer, which is 33% for women and 50% for men according to the [place]. - Radiation induced injury can include skin redness, resembling a rash, tissue breakdown / ulcers and hair loss (which can be temporary or permanent).  The likelihood of either of these occurring depends on the difficulty of the procedure and whether you are sensitive to radiation due to previous procedures, disease, or genetic conditions.  IF your procedure requires a prolonged use of radiation, you will be notified and given written instructions for further action.  It is your responsibility to monitor the irradiated area for the 2  weeks following the procedure and to notify your physician if you are concerned that you have suffered a radiation induced injury.   All of the patient's questions were answered, patient is agreeable to proceed.  Thank you for this interesting consult.  I greatly enjoyed meeting LOUIE FLENNER and look forward to participating in their care.  A copy of this report was sent to the requesting provider on this date.  Electronically Signed: Jacqualine Mau, NP 01/03/2023, 9:18 AM   I spent a total of 40 Minutes    in face to face in clinical consultation, greater than 50% of which was counseling/coordinating care for cerebral angiogram with intervention.

## 2023-01-03 NOTE — Progress Notes (Signed)
Subjective:   Patient is feeling okay today. He is unsure if his right-sided weakness has improved.  He has no new complaints overnight. Discussed plan for angiogram today with potential stenting. Discussed importance of using incentive spirometer while on bed rest.   Objective:  Vital signs in last 24 hours: Vitals:   01/02/23 1208 01/02/23 1941 01/02/23 2300 01/03/23 0351  BP: (!) 122/53 136/63 (!) 105/52 (!) 141/65  Pulse: 68 60 (!) 56 (!) 51  Resp: '16 20 17 18  '$ Temp: 98.3 F (36.8 C) (!) 97.5 F (36.4 C)  98 F (36.7 C)  TempSrc: Oral Oral Oral Oral  SpO2: 100% 99% 100% 100%  Weight:      Height:       Physical Exam: General: chronically ill-appearing CV: RRR, no murmurs, rubs, or gallops.  Pulm: Mild cough. No respiratory distress. On RA.   Skin: Warm and dry Ext: No LE edema Neuro: oriented to person, place, time. Conversational. Follows commands. Speech continues to improve. Moves all extremities. 2/5 R grip strength improving. 4/5  R wrist strength. 4+/5 elbow on R. Motor exam of left side unchanged from last few days.   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      Sepsis with Acute Encephalopathy Recent hx of pneumonia Influenza A Continues to improved. Hemodynamically stable today. Has been requiring IV fluid boluses to prevent hypotension. Afebrile w/o leukocytosis. Coughing less today. Low suspicion for infection at this time. Currently on bedrest. Important  that patient uses incentive spirometer/flutter valve to prevent atelectasis/pneumonia.  -Supportive care -IS and flutter valve   Left MCA CVA  40-59% stenosis in left ICA, 1-39% stenosis in right ICA on Carotid U/S. Plan for diagnostic angio today w/ dr. Debbrah Alar. Patient will be transferred to the neuro ICU for monitoring following his procedure today. Transitioned to ticagrelor. Strict bedrest orders in place to prevent orthostatic hypotension. NPO, will bolus IV fluids as needed. Patient on prednisone '40mg'$  to prevent any relative adrenal insufficiency in the setting of recent steroid use. Will taper prednisone over the next few weeks (40 -> 20 -> 10 -> 5).  -Ticagrelor '90mg'$  BID -ASA '81mg'$  -Atorvastatin '40mg'$  daily -Bed rest ordered, hold PT/OT for today -Neuro checks -Telemetry - Prednisone 40 mg daily (day 2/7)   Thrombocytopenia Platelets stabilized at 108 today. Improved from yesterday. No heparin administered during hospitalization. Smear and PT/INR WNL. Suspect secondary to suppression from influenza. May require platelet transfusion if platelets trend downward.  -Trend CBC -Platelet transfusion as necessary for vascular intervention   Malnutrition  Hypokalemia - resolved Hypomagnesemia - resolved Hypophosphatemia - resolved  Continuous tube feeds at home. Feeds interrupted prior to hospitalization in setting of frequent medical visits. Will continue w/ continuous tube feeds and free H20 flushes. RD consulted. Home regimen will likely be osmolite 1.5 at 80 ml/h x 18 hours and 200 mL free water q4 hours upon discharge.   -Osmolite 1000 mL (60 mL/hr) per tube -Prosouce 60 mL daily per tube -Free water flushes 200 mL q4  hours per tube   T2DM   Fasting glucose 139 this morning. CBGs in the mid 100s-mid 200s over last 24 hours.  -Trend CBGs  - SSI   Hypothyroidism  Takes levothyroxine at home. TSH mildly elevated, but suspect secondary to acute illness.  -Levothyroxine 50 mcg  daily, per tube   Esophageal Outlet Obstruction with PEG tube   Plan for patient to follow up with outpatient GI for further workup if needed.    Dispo OT recommends [place] OT w/o equipment.    Diet: Tube feeds Bowel: none VTE: none IVF: None Code: Full  Prior to Admission Living Arrangement: at home w/ ex-wife  Anticipated Discharge Location: TBD Barriers to Discharge: continued management Dispo: Anticipated discharge in approximately more than 2 day(s).  Starlyn Skeans, MD 01/03/2023, 6:19 AM Pager: [phone] After 5pm on weekdays and 1pm on weekends: On Call pager [phone]

## 2023-01-03 NOTE — Progress Notes (Signed)
PT Cancellation Note  Patient Details Name: [name] MRN: [id number] DOB: 11/10/1946   Cancelled Treatment:    Reason Eval/Treat Not Completed: Patient at procedure or test/unavailable  Patient in IR for a procedure. Noted prior to this procedure, pt remained on bedrest since 01/01/23. Please clarify when OK to resume activity.    [place]  Office [phone]  Rexanne Mano 01/03/2023, 12:47 PM

## 2023-01-03 NOTE — Procedures (Signed)
INTERVENTIONAL NEURORADIOLOGY BRIEF POSTPROCEDURE NOTE  ULTRASOUND-GUIDED VASCULAR ACCESS  DIAGNOSTIC CEREBRAL ANGIOGRAM LEFT CAROTID STENTING WITH CEREBRAL PROTECTION DEVICE   Attending: Dr. Pedro Earls  Diagnosis: Symptomatic left carotid disease   Access site: Right common femoral artery   Access closure: A [place] Angio-Seal   Anesthesia: MAC   Medication used: 5000 units of heparin IV.  Complications: None   Estimated blood loss: Minimal   Specimen: None   Findings: Ulcerated left carotid artery disease resulting in approximately 80% stenosis.  Carotid stenting performed with a 8-6 x 40 mm Xact carotid stent with the use of a cerebral protection device.  No thromboembolic or hemorrhagic complication.   The patient tolerated the procedure well without incident or complication and is in stable condition.    PLAN:   - Bed rest x6 hours status post femoral access.  - SBP 100-140 mmHg.

## 2023-01-03 NOTE — Sedation Documentation (Signed)
LEFT ICA stent deployed.

## 2023-01-03 NOTE — Anesthesia Procedure Notes (Signed)
Procedure Name: MAC Date/Time: 01/03/2023 2:25 PM  Performed by: Barrington Ellison, CRNAPre-anesthesia Checklist: Patient identified, Emergency Drugs available, Suction available, Patient being monitored and Timeout performed Patient Re-evaluated:Patient Re-evaluated prior to induction Oxygen Delivery Method: Simple face mask

## 2023-01-04 ENCOUNTER — Other Ambulatory Visit (HOSPITAL_COMMUNITY): Payer: Self-pay

## 2023-01-04 DIAGNOSIS — I10 Essential (primary) hypertension: Secondary | ICD-10-CM

## 2023-01-04 DIAGNOSIS — Z87891 Personal history of nicotine dependence: Secondary | ICD-10-CM | POA: Diagnosis not present

## 2023-01-04 DIAGNOSIS — J101 Influenza due to other identified influenza virus with other respiratory manifestations: Secondary | ICD-10-CM | POA: Diagnosis not present

## 2023-01-04 DIAGNOSIS — G9340 Encephalopathy, unspecified: Secondary | ICD-10-CM | POA: Diagnosis not present

## 2023-01-04 DIAGNOSIS — I639 Cerebral infarction, unspecified: Secondary | ICD-10-CM | POA: Diagnosis not present

## 2023-01-04 LAB — CULTURE, BLOOD (ROUTINE X 2)
Culture: NO GROWTH
Culture: NO GROWTH
Special Requests: ADEQUATE
Special Requests: ADEQUATE

## 2023-01-04 LAB — BASIC METABOLIC PANEL
Anion gap: 10 (ref 5–15)
BUN: 19 mg/dL (ref 8–23)
CO2: 21 mmol/L — ABNORMAL LOW (ref 22–32)
Calcium: 8 mg/dL — ABNORMAL LOW (ref 8.9–10.3)
Chloride: 107 mmol/L (ref 98–111)
Creatinine, Ser: 0.66 mg/dL (ref 0.61–1.24)
GFR, Estimated: >60 mL/min (ref >60)
Glucose, Bld: 108 mg/dL — ABNORMAL HIGH (ref 70–99)
Potassium: 3.4 mmol/L — ABNORMAL LOW (ref 3.5–5.1)
Sodium: 138 mmol/L (ref 135–145)

## 2023-01-04 LAB — GLUCOSE, CAPILLARY
Glucose-Capillary: 115 mg/dL — ABNORMAL HIGH (ref 70–99)
Glucose-Capillary: 96 mg/dL (ref 70–99)
Glucose-Capillary: 192 mg/dL — ABNORMAL HIGH (ref 70–99)
Glucose-Capillary: 241 mg/dL — ABNORMAL HIGH (ref 70–99)
Glucose-Capillary: 266 mg/dL — ABNORMAL HIGH (ref 70–99)
Glucose-Capillary: 268 mg/dL — ABNORMAL HIGH (ref 70–99)

## 2023-01-04 LAB — CBC
HCT: 26 % — ABNORMAL LOW (ref 39.0–52.0)
Hemoglobin: 8.3 g/dL — ABNORMAL LOW (ref 13.0–17.0)
MCH: 28.2 pg (ref 26.0–34.0)
MCHC: 31.9 g/dL (ref 30.0–36.0)
MCV: 88.4 fL (ref 80.0–100.0)
Platelets: 150 10*3/uL (ref 150–400)
RBC: 2.94 MIL/uL — ABNORMAL LOW (ref 4.22–5.81)
RDW: 17.9 % — ABNORMAL HIGH (ref 11.5–15.5)
WBC: 5.7 10*3/uL (ref 4.0–10.5)
nRBC: 0 % (ref 0.0–0.2)

## 2023-01-04 MED ORDER — OSMOLITE 1.5 CAL PO LIQD
1000.0000 mL | ORAL | Status: DC
  Administered 2023-01-04 – 2023-01-06 (×4): 1000 mL
  Filled 2023-01-04 (×4): qty 1000

## 2023-01-04 MED ORDER — PREDNISONE 5 MG PO TABS
10.0000 mg | ORAL_TABLET | Freq: Every day | ORAL | Status: DC
  Administered 2023-01-05 – 2023-01-06 (×2): 10 mg
  Filled 2023-01-04 (×2): qty 2

## 2023-01-04 MED ORDER — PROSOURCE TF20 ENFIT COMPATIBL EN LIQD
60.0000 mL | Freq: Every day | ENTERAL | Status: DC
  Administered 2023-01-04 – 2023-01-06 (×3): 60 mL
  Filled 2023-01-04 (×3): qty 60

## 2023-01-04 MED ORDER — POTASSIUM CHLORIDE 20 MEQ PO PACK
40.0000 meq | PACK | Freq: Two times a day (BID) | ORAL | Status: AC
  Administered 2023-01-04 (×2): 40 meq
  Filled 2023-01-04 (×2): qty 2

## 2023-01-04 MED ORDER — FREE WATER
150.0000 mL | Status: DC
  Administered 2023-01-04 – 2023-01-06 (×15): 150 mL

## 2023-01-04 MED ORDER — OSMOLITE 1.5 CAL PO LIQD: 1000.0000 mL | ORAL | Status: DC

## 2023-01-04 MED ORDER — FREE WATER: 200.0000 mL | Status: DC

## 2023-01-04 NOTE — Progress Notes (Signed)
Physical Therapy Treatment Patient Details Name: [name] MRN: [id number] DOB: 10-Dec-1946 Today's Date: 01/04/2023   History of Present Illness 77 y.o. male admitted 1/1 after found on floor.  YPP:[id number] foci of restricted diffusion left frontal lobe and left parietal lobe.  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  Pt (+) flu A and possible aspiration PNA. 1/5 code stroke with increased Rt weakness. 1/8 carotid stent in IR. 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 Pt remains with limited strength and function of RUE making transfers and mobility more of a challenge. Pt was performing tube feeds and mobility on his own prior to admission and walking prior to stent. Pt currently limited by fatigue, weakness and need for assist with balance. Pt would benefit from AIR to maximize function prior to return home. Pt educated for HEP, progressive transfers.   Supine 10/55,HR 71 Sitting 94/58 Standing 97/50, HR 70 Pt on RA with SPO2 98%    Recommendations for follow up therapy are one component of a multi-disciplinary discharge planning process, led by the attending physician.  Recommendations may be updated based on patient status, additional functional criteria and insurance authorization.  Follow Up Recommendations  Acute inpatient rehab (3hours/day)     Assistance Recommended at Discharge Frequent or constant Supervision/Assistance  Patient can return home with the following A little help with walking and/or transfers;A lot of help with bathing/dressing/bathroom;Assistance with cooking/housework;Direct supervision/assist for medications management;Assist for transportation;Assistance with feeding    Equipment Recommendations  Rolling walker (2 wheels)    Recommendations for Other Services       Precautions / Restrictions Precautions Precautions: Fall Precaution Comments: peg tube, flu +, rectal pouch     Mobility  Bed Mobility Overal bed mobility: Needs Assistance Bed Mobility: Supine to Sit     Supine to sit: Min assist     General bed mobility comments: HOB flat with min assist to roll to left and rise to sitting. mod assist for reciprocal scooting to EOB    Transfers Overall transfer level: Needs assistance   Transfers: Sit to/from Stand, Bed to chair/wheelchair/BSC Sit to Stand: Min assist Stand pivot transfers: Min assist         General transfer comment: min assist to rise from surface, min assist with belt to pivot bed to chair. pt declined further transfers or mobility    Ambulation/Gait               General Gait Details: pt denied attempting   Stairs             Wheelchair Mobility    Modified Rankin (Stroke Patients Only)       Balance Overall balance assessment: Needs assistance Sitting-balance support: Feet supported Sitting balance-Leahy Scale: Fair     Standing balance support: Single extremity supported Standing balance-Leahy Scale: Poor Standing balance comment: assist for standing balance                            Cognition Arousal/Alertness: Awake/alert Behavior During Therapy: Flat affect Overall Cognitive Status: Within Functional Limits for tasks assessed  Exercises General Exercises - Lower Extremity Short Arc Quad: AROM, Both, Seated, 10 reps Hip Flexion/Marching: AROM, Both, 10 reps, Seated    General Comments        Pertinent Vitals/Pain Pain Assessment Pain Score: 5  Pain Location: bil hands Pain Descriptors / Indicators: Aching, Sore Pain Intervention(s): Limited activity within patient's tolerance, Repositioned,  Monitored during session    Home Living                          Prior Function            PT Goals (current goals can now be found in the care plan section) Acute Rehab PT Goals Time For Goal Achievement: 01/18/23 Potential to Achieve Goals: Good Progress towards PT goals: Goals downgraded-see care plan    Frequency    Min 4X/week      PT Plan Discharge plan needs to be updated    Co-evaluation              AM-PAC PT "6 Clicks" Mobility   Outcome Measure  Help needed turning from your back to your side while in a flat bed without using bedrails?: A Lot Help needed moving from lying on your back to sitting on the side of a flat bed without using bedrails?: A Little Help needed moving to and from a bed to a chair (including a wheelchair)?: A Little Help needed standing up from a chair using your arms (e.g., wheelchair or bedside chair)?: A Little Help needed to walk in hospital room?: Total Help needed climbing 3-5 steps with a railing? : Total 6 Click Score: 13    End of Session Equipment Utilized During Treatment: Gait belt Activity Tolerance: Patient limited by fatigue Patient left: in chair;with call bell/phone within reach;with chair alarm set Nurse Communication: Mobility status PT Visit Diagnosis: Unsteadiness on feet (R26.81);Muscle weakness (generalized) (M62.81);Difficulty in walking, not elsewhere classified (R26.2)     Time: 1030-1051 PT Time Calculation (min) (ACUTE ONLY): 21 min  Charges:  $Therapeutic Activity: 8-22 mins                     Bayard Males, PT Acute Rehabilitation Services Office: [phone]    Beverlyn Mcginness B Tamario Heal 01/04/2023, 11:01 AM

## 2023-01-04 NOTE — Progress Notes (Addendum)
Referring Physician(s): IM Service and Dr Curly Shores   Supervising Physician: Pedro Earls  Patient Status:  [facility], [place] - In-pt  Chief Complaint:  Right sided weakness Aphasia  Subjective:  Hypoperfusion Rt hand drop/ weakness +Influenza Patient was found to have a left ICA stenosis with ulcerated plaque, sepsis with acute encephalopathy and influenza. MRI performed at that time showed left MCA territory infarct.  Left ICA angioplasty/stent 01/03/23 with Dr Tennis Must Sindy Messing  Findings: Ulcerated left carotid artery disease resulting in approximately 80% stenosis. Carotid stenting performed with a 8-6 x 40 mm Xact carotid stent with the use of a cerebral protection device. No thromboembolic or hemorrhagic complication.   To ICU Pt doing well Still with Rt hand weakness; but moving all 4s to command Up in bed Answers all questions Alert  Dr Tennis Must Veneda Melter has seen and examined pt  Allergies: Patient has no known allergies.  Medications: Prior to Admission medications   Medication Sig Start Date End Date Taking? Authorizing Provider  abatacept (ORENCIA) 250 MG injection Inject 250 mg into the vein every 30 (thirty) days. Patient taking differently: Inject 750 mg into the vein every 30 (thirty) days. 02/12/21  Yes Sanjuan Dame, MD  atorvastatin (LIPITOR) 40 MG tablet Take 1 tablet (40 mg total) by mouth daily. 08/27/22  Yes Leigh Aurora, DO  famotidine (PEPCID) 20 MG tablet Take 1 tablet (20 mg total) by mouth daily. 08/27/22 08/27/23 Yes Leigh Aurora, DO  Feeding Supplies KIT 1 Box by Does not apply route daily. Patient will need pump and supplies for continuous tube feeds and 30m syringes for free water flushes 12/31/22  Yes Mapp, Tavien, MD  ferrous sulfate 300 (60 Fe) MG/5ML syrup Take 5 mLs (300 mg total) by mouth daily. 08/27/22  Yes PLeigh Aurora DO  folic acid (FOLVITE) 1 MG tablet Take 1 tablet (1 mg total) by mouth daily. 08/27/22  Yes PLeigh Aurora DO   levothyroxine (SYNTHROID) 50 MCG tablet Take 1 tablet (50 mcg total) by mouth daily. 08/27/22  Yes PLeigh Aurora DO  magnesium oxide (MAG-OX) 400 (240 Mg) MG tablet Take 1 tablet (400 mg total) by mouth daily. 08/27/22  Yes PLeigh Aurora DO  metFORMIN (GLUCOPHAGE) 500 MG tablet Take 1 tablet (500 mg total) by mouth daily. 08/27/22  Yes PLeigh Aurora DO  Methotrexate 25 MG/ML SOSY Inject 15 mg into the skin once a week.   Yes [provider]  ondansetron (ZOFRAN) 4 MG tablet Take 1 tablet (4 mg total) by mouth every 12 (twelve) hours as needed for nausea or vomiting. 08/27/22  Yes PLeigh Aurora DO  oxyCODONE (OXY IR/ROXICODONE) 5 MG immediate release tablet Take 1 tablet (5 mg total) by mouth every 4 (four) hours as needed for moderate pain. 08/27/22  Yes CRick Duff MD  pantoprazole (PROTONIX) 40 MG tablet Take 1 tablet (40 mg total) by mouth 2 (two) times daily. Via tube 08/27/22  Yes CRick Duff MD  predniSONE (DELTASONE) 1 MG tablet Take 1-5 tablets (1-5 mg total) by mouth See admin instructions. 5 mg daily for 1 month  4 mg daily for 1 month, 3 mg daily for 1 month, 2 mg daily for 1 month, 1 mg daily for 1 month. 08/27/22  Yes Patel, Amar, DO  sucralfate (CARAFATE) 1 GM/10ML suspension Take 1 g by mouth 4 (four) times daily -  before meals and at bedtime.   Yes [provider]  tadalafil (CIALIS) 5 MG tablet Take 2 tablets (10 mg  total) by mouth daily. 08/27/22  Yes Leigh Aurora, DO  alum & mag hydroxide-simeth (MAALOX/MYLANTA) 200-200-20 MG/5ML suspension Take 30 mLs by mouth 3 (three) times daily as needed for indigestion or heartburn. Patient not taking: Reported on 12/27/2022 08/27/22   Rick Duff, MD  aspirin 81 MG chewable tablet Place 1 tablet (81 mg total) into feeding tube daily. 01/01/23   Mapp, Claudia Desanctis, MD  clopidogrel (PLAVIX) 75 MG tablet Place 1 tablet (75 mg total) into feeding tube daily. 01/01/23   Mapp, Claudia Desanctis, MD  diclofenac Sodium (VOLTAREN) 1 % GEL Apply 2 g  topically 4 (four) times daily. Patient not taking: Reported on 12/28/2022 08/27/22   Rick Duff, MD  enoxaparin (LOVENOX) 40 MG/0.4ML injection Inject 40 mg into the skin daily. Patient not taking: Reported on 12/28/2022 08/06/22   [provider]  lidocaine (XYLOCAINE) 2 % solution Use as directed 15 mLs in the mouth or throat 3 (three) times daily as needed for mouth pain (Indigestion). Patient not taking: Reported on 12/28/2022 08/27/22   Rick Duff, MD  Nutritional Supplements (FEEDING SUPPLEMENT, OSMOLITE 1.5 CAL,) LIQD Place 1,000 mLs into feeding tube continuous. For 18 hours daily. 12/31/22   Mapp, Claudia Desanctis, MD  predniSONE (DELTASONE) 1 MG tablet Place 1 tablet (1 mg total) into feeding tube daily with breakfast. 01/01/23   Mapp, Claudia Desanctis, MD  Water For Irrigation, Sterile (FREE WATER) SOLN Place 200 mLs into feeding tube every 4 (four) hours. 12/31/22   Mapp, Claudia Desanctis, MD     Vital Signs: BP (!) 97/51   Pulse 66   Temp 97.9 F (36.6 C) (Oral)   Resp 17   Ht '6\' 1"'$  (1.854 m)   Wt 156 lb 4.9 oz (70.9 kg)   SpO2 98%   BMI 20.62 kg/m   Physical Exam Vitals reviewed.  Constitutional:      Comments: Answers all questions appropriately  Musculoskeletal:     Comments: Moves all 4s to command Rt hand weakness; Rt wrist drop  Good leg strength and movement bilaterally      Skin:    General: Skin is warm.     Comments: Rt groin site is clean and dry NT Soft No hematoma Rt foot 1+ pulses   Neurological:     Mental Status: He is alert and oriented to person, place, and time.     Imaging: MR BRAIN WO CONTRAST  Result Date: 01/01/2023 CLINICAL DATA:  Neuro deficit, acute, stroke suspected neurological deficits. Right-sided weakness and aphasia. EXAM: MRI HEAD WITHOUT CONTRAST TECHNIQUE: Multiplanar, multiecho pulse sequences of the brain and surrounding structures were obtained without intravenous contrast. COMPARISON:  Head CT and CTA 12/31/2022.  Head MRI 12/27/2022.  FINDINGS: Brain: Small recent cortical and subcortical infarcts are again seen in the left cerebral hemisphere involving the posterior frontal lobe, parietal lobe, occipital lobe, and posterior temporal lobe. One or two of the punctate infarcts are more conspicuous on the prior MRI, however no sizable new infarct or significant infarct extension is evident. A punctate infarct in the left corona radiata is also unchanged. A chronic microhemorrhage in the right cerebellum is unchanged. A small chronic left parietal and right thalamic infarcts are again noted. T2 hyperintensities elsewhere in the cerebral white matter bilaterally are unchanged and nonspecific but compatible with mild chronic small vessel ischemic disease. There is mild cerebral atrophy. Vascular: Major intracranial vascular flow voids are preserved. Skull and upper cervical spine: Unremarkable bone marrow signal. Sinuses/Orbits: Bilateral cataract extraction. Mild-to-moderate mucosal thickening and fluid in  the paranasal sinuses. Small lateral mastoid effusions. Other: None. IMPRESSION: Multiple known small subacute left cerebral infarcts without definite new infarct or extension. Electronically Signed   By: Logan Bores M.D.   On: 01/01/2023 15:39   DG CHEST PORT 1 VIEW  Result Date: 01/01/2023 CLINICAL DATA:  Aspiration. EXAM: PORTABLE CHEST 1 VIEW COMPARISON:  December 27, 2022 FINDINGS: Skin folds overlie the upper chest, right greater than left. No pneumothorax identified. The cardiomediastinal silhouette is stable. Mild bibasilar opacities, not identified previously. No other acute abnormalities. IMPRESSION: Mild bibasilar opacities could represent aspiration given history. Recommend short-term follow-up imaging to ensure resolution. Electronically Signed   By: Dorise Bullion III M.D.   On: 01/01/2023 10:36   CT ANGIO HEAD NECK W WO CM (CODE STROKE)  Result Date: 12/31/2022 CLINICAL DATA:  Neuro deficit, acute, stroke suspected. Right-sided  weakness and aphasia. EXAM: CT ANGIOGRAPHY HEAD AND NECK TECHNIQUE: Multidetector CT imaging of the head and neck was performed using the standard protocol during bolus administration of intravenous contrast. Multiplanar CT image reconstructions and MIPs were obtained to evaluate the vascular anatomy. Carotid stenosis measurements (when applicable) are obtained utilizing NASCET criteria, using the distal internal carotid diameter as the denominator. RADIATION DOSE REDUCTION: This exam was performed according to the departmental dose-optimization program which includes automated exposure control, adjustment of the mA and/or kV according to patient size and/or use of iterative reconstruction technique. CONTRAST:  65m OMNIPAQUE IOHEXOL 350 MG/ML SOLN COMPARISON:  Head and neck CTA 12/27/2022 FINDINGS: CTA NECK FINDINGS Aortic arch: Standard 3 vessel aortic arch with extensive calcified and soft plaque. Diffuse atherosclerotic irregularity of the brachiocephalic and subclavian arteries without a significant stenosis. Right carotid system: Patent with similar appearance of widespread calcified and soft plaque throughout the common carotid artery and more prominent calcified plaque at the carotid bifurcation resulting in 50% stenosis of the ICA origin. Left carotid system: Patent with similar appearance of widespread calcified and soft plaque throughout the common carotid artery and more extensive soft and calcified plaque in the carotid bulb resulting in 60% stenosis of the proximal ICA. Similar appearance of ulcerated plaque the carotid bulb. Vertebral arteries: Patent with the left being mildly dominant. Calcified plaque at the vertebral origins resulting in unchanged severe stenosis on the right and mild to moderate stenosis on the left. Skeleton: Advanced disc and facet degeneration in the cervical spine. Other neck: No evidence of cervical lymphadenopathy or mass. Upper chest: Patulous esophagus containing fluid.  Biapical lung scarring. Review of the MIP images confirms the above findings CTA HEAD FINDINGS Anterior circulation: The internal carotid arteries are patent from skull base to carotid termini with calcified plaque resulting in unchanged moderate left and at most mild right cavernous segment stenosis. ACAs and MCAs are patent without evidence of a proximal branch occlusion or significant proximal stenosis. No aneurysm is identified. Posterior circulation: The intracranial vertebral arteries are patent to the basilar with unchanged mild stenosis on the left. Patent PICA and SCA origins are seen bilaterally. The basilar artery is widely patent. Posterior communicating arteries are diminutive or absent. Both PCAs are patent without evidence of significant proximal stenosis. No aneurysm is identified. Venous sinuses: As permitted by contrast timing, patent. Anatomic variants: None. Review of the MIP images confirms the above findings Preliminary finding of no emergent large vessel occlusion was communicated to Dr. BCurly Shoresat 5:35 p.m. on 12/31/2022 by text page [facility]. IMPRESSION: 1. No large vessel occlusion. 2. Unchanged advanced atherosclerosis in  the head and neck. 3. Moderate left cavernous ICA stenosis. 4. 50% right and 60% left proximal ICA stenoses with ulcerated plaque on the left. 5. Severe right and mild-to-moderate left vertebral artery origin stenoses. 6.  Aortic Atherosclerosis (ICD10-I70.0). Electronically Signed   By: Logan Bores M.D.   On: 12/31/2022 18:59   CT HEAD CODE STROKE WO CONTRAST  Result Date: 12/31/2022 CLINICAL DATA:  Code stroke. Neuro deficit, acute, stroke suspected. Slurred speech. EXAM: CT HEAD WITHOUT CONTRAST TECHNIQUE: Contiguous axial images were obtained from the base of the skull through the vertex without intravenous contrast. RADIATION DOSE REDUCTION: This exam was performed according to the departmental dose-optimization program which includes automated  exposure control, adjustment of the mA and/or kV according to patient size and/or use of iterative reconstruction technique. COMPARISON:  Head CT and MRI 12/27/2022 FINDINGS: Brain: A small subacute infarct is visible in the left precentral gyrus, however the other small left cerebral infarcts on the recent prior MRI are largely occult by CT. No definite new infarct, intracranial hemorrhage, mass, midline shift, or extra-axial fluid collection is identified. There is mild cerebral atrophy. Background mild chronic small vessel ischemia is noted in the cerebral white matter. Vascular: Calcified atherosclerosis at the skull base. No hyperdense vessel. Skull: No acute fracture or suspicious osseous lesion. Sinuses/Orbits: Mild-to-moderate mucosal thickening and scattered fluid in the paranasal sinuses. Trace left mastoid effusion. Bilateral cataract extraction. Other: None. ASPECTS [facility] Stroke Program Early CT Score) Not scored given the presence of recent MCA infarcts. IMPRESSION: 1. Known small subacute left cerebral infarcts. 2. No definite new infarct or intracranial hemorrhage. These results were communicated to Dr. Curly Shores at 5:35 pm on 12/31/2022 by text page via the [facility] messaging system. Electronically Signed   By: Logan Bores M.D.   On: 12/31/2022 17:36    Labs:  CBC: Recent Labs    01/01/23 1541 01/02/23 0240 01/03/23 0426 01/04/23 0455  WBC 7.0 4.2 6.3 5.7  HGB 8.7* 9.1* 9.1* 8.3*  HCT 26.4* 26.4* 26.8* 26.0*  PLT 96* 94* 108* 150    COAGS: Recent Labs    12/27/22 0946 01/01/23 1547  INR 1.2 1.2  APTT 33  --     BMP: Recent Labs    01/01/23 0559 01/02/23 0240 01/03/23 0426 01/04/23 0455  NA 129* 133* 135 138  K 3.5 3.7 3.6 3.4*  CL 101 101 104 107  CO2 21* 23 23 21*  GLUCOSE 141* 268* 139* 108*  BUN '20 19 21 19  '$ CALCIUM 7.5* 8.1* 8.4* 8.0*  CREATININE 0.76 0.85 0.65 0.66  GFRNONAA >60 >60 >60 >60    LIVER FUNCTION TESTS: Recent Labs    08/18/22 1418  12/27/22 0946 12/31/22 0526  BILITOT 0.5 0.6 0.4  AST 16 34 36  ALT '13 18 23  '$ ALKPHOS 66 84 50  PROT 5.9* 6.2* 4.9*  ALBUMIN 3.1* 3.2* 2.3*    Assessment and Plan:  Left ICA angioplasty/stent performed in NIR with Dr Tennis Must Sindy Messing 01/03/23 Doing well per NIR standpoint Int Med to follow pt Continue all home meds; continue ASA 81 mg and Brilinta 90 mg BID daily Will place OP order for 3 month follow up and US carotid arteries Pt is aware and agreeable with plans  Electronically Signed: Lavonia Drafts, PA-C 01/04/2023, 9:42 AM   I spent a total of 25 Minutes at the the patient's bedside AND on the patient's hospital floor or unit, greater than 50% of which was counseling/coordinating care for L  ICA revascularization

## 2023-01-04 NOTE — Progress Notes (Addendum)
Subjective:  Patient had angiogram with stent placement in Left ICA by IR yesterday.   Patient reports feeling better overall and is sitting upright in chair. He endorses coughing and a little shortness of breath. He has been trying to use the incentive spirometer. He denies noticing any improvement to R hand movement.   Objective:  Vital signs in last 24 hours: Vitals:   01/04/23 0645 01/04/23 0700 01/04/23 0715 01/04/23 0717  BP:      Pulse: (!) 56 (!) 58 63   Resp: '16 18 18   '$ Temp:    97.9 F (36.6 C)  TempSrc:    Oral  SpO2: 98% 96% 97%   Weight:      Height:       Weight change: -3.1 kg  Intake/Output Summary (Last 24 hours) at 01/04/2023 0733 Last data filed at 01/04/2023 0600 Gross per 24 hour  Intake 912.92 ml  Output 2045 ml  Net -1132.08 ml   Physical Exam General: Alert, in no acute distress HEENT: Normocephalic CV: RRR, no murmurs, rubs, or gallops Pulm: Normal respiratory effort. Lungs CTA bilaterally.  Skin: Warm and dry Ext: No LE edema Neuro: Alert and conversational. Mild dysarthria. 0/5 grip in R hand, 5/5 in L hand.  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   Left MCA CVA  Cerebral Angiogram and L Carotid stent procedure done by IR yesterday, patient in neuro ICU this morning. Patient has remained stable after procedure and doing better overall, plan to restart PT/OT and tube feeds. Patient has a history of chronic  steroid use, plan to start lower dose of prednisone today and taper on discharge.  -Discontinue Cleviprex infusion -Ticagrelor '90mg'$  BID -ASA '81mg'$  -Atorvastatin '40mg'$  daily per tube -Prednisone 10 mg daily per tube -Resume PT/OT -Neuro checks -Telemetry  Sepsis with Acute Encephalopathy Recent hx of pneumonia Influenza A Patient remains hemodynamically stable today. Afebrile, WBC 5.7 today. Lung exam is improved, O2 sat was 97% in the room. Discussed continuing to use spirometer/flutter valve today.  -Continue incentive spirometer/flutter valve -Continue monitor respiratory status   Malnutrition  Hypokalemia Hypomagnesemia - resolved Hypophosphatemia - resolved K 3.4 today, will replete per tube. Continuous tube feeds at home, feeds interrupted prior to hospitalization in setting of frequent medical visits. Tube feeds were held pending angiogram and stent procedures, plan to resume feeds today.  -Kcl 40 mEq x2 by PEG  -Osmolite 1000 mL (60 mL/hr) per tube -Prosouce 60 mL daily per tube -Free water flushes 200 mL q4 hours per tube -BMP, Phos, and Mag tomorrow AM  Thrombocytopenia - resolved Platelets improved to 150 today, will continue to monitor. -Trend CBC  T2DM   A1c 6.2%. PTA on metformin. Fasting glucose 115 this morning. CBGs in the mid 91-152 over last 24 hours.  -Trend CBGs  - SSI  Hypothyroidism   Takes levothyroxine at home. TSH mildly elevated, but suspect secondary to acute illness.  -Levothyroxine 50 mcg daily, per tube  Esophageal Outlet Obstruction  with PEG tube   Plan for patient to follow up with outpatient GI for further workup if needed.     Dispo OT recommends [facility] w/o equipment.    Diet: Tube feeds Bowel: none VTE: none IVF: None Code: Full   Prior to Admission Living Arrangement: at home w/ ex-wife  Anticipated Discharge Location: TBD Barriers to Discharge: continued management Dispo: Anticipated discharge in approximately more than 2  day(s).    LOS: 8 days   Spero Curb, Medical Student 01/04/2023, 7:33 AM  Attestation for Student Documentation:  I personally was present and performed or re-performed the history, physical exam and medical decision-making activities of this service and have verified that the service and findings are accurately documented in the student's note.  Leigh Aurora, DO 01/04/2023, 2:42 PM

## 2023-01-04 NOTE — Progress Notes (Signed)
Nutrition Follow-up  DOCUMENTATION CODES:  Severe malnutrition in context of chronic illness  INTERVENTION:  Recommend the following tube feeding via GJ-tube: Osmolite 1.5 at 85 ml/h (1530 ml per day) x 18 hours 118m of free water 6x/d,  Provides 2295 kcal, 96 gm protein, 1166 ml free water daily (20680mof water with flushes)   NUTRITION DIAGNOSIS:  Severe Malnutrition related to chronic illness as evidenced by severe fat depletion, severe muscle depletion. 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  Pt was planned for discharge on 1/5 but after working with therapy developed weakness and AMS. Code stroke called which was negative but prior imaging had showed moderate left ICA stenosis and severe right left vertebral artery stenosis.  1/8 - Carotid stenting    Pt resting in bedside chair at the time of assessment. Reports that he is feeling some better today, but that he is trying to get his strength back. Attempted to obtain additional information about home regimen. Pt reports that he received feeds for "6 or 9 hours" a day. If accurate, this seems like an insufficient about of time to receive adequate nutrition. Discussed with pt that I plan to send him home with 18 hour feeds with the majority of this time being overnight to allow him flexibility during the day. [facility] as [facility] fit but that in order to try  and regain muscle that was previously lost, it would be important to take in adequate enteral nutrition.   Adjusted goal rate from previously calculated to 85100m 18 hours which would provide 1.53L or formula daily (~6.5 cartons/d).  Discussed with attending and RN, will restart feeds today and advance to goal. Per discussion in rounds, pt doing well and likely stable to leave the ICU.   Intake/Output Summary (Last 24 hours) at 01/04/2023 1151 Last data filed at 01/04/2023 1028 Gross per 24 hour  Intake 963.96 ml  Output 1095 ml  Net -131.04 ml  Net IO Since Admission: 6,071.52 mL [01/04/23 1151]  Nutritionally Relevant Medications: Scheduled Meds:  atorvastatin  40 mg Per Tube Daily   insulin aspart  0-5 Units Subcutaneous QHS   insulin aspart  0-9 Units Subcutaneous TID WC   potassium chloride  40 mEq Per Tube BID   predniSONE  40 mg Per Tube Q breakfast   thiamine  100 mg Per Tube Daily   ticagrelor  90 mg Oral BID   Continuous Infusions:  clevidipine 2 mg/hr (01/04/23 0600)   Labs Reviewed: K 3.4 CBG ranges from 91-192 mg/dL over the last 24 hours HgbA1c 6.2%  NUTRITION - FOCUSED PHYSICAL EXAM: Flowsheet Row Most Recent Value  Orbital Region Moderate depletion  Upper Arm Region Severe depletion  Thoracic and Lumbar Region Severe depletion  Buccal Region Moderate depletion  Temple Region Mild depletion  Clavicle Bone Region Severe depletion  Clavicle and Acromion Bone Region Moderate depletion  Scapular Bone Region Severe depletion  Dorsal Hand Severe depletion  Patellar Region Severe depletion  Anterior Thigh Region Severe depletion  Posterior Calf Region Severe depletion  Edema (RD Assessment) None  Hair Reviewed  Eyes Reviewed  Mouth Reviewed  Skin Reviewed  Nails Reviewed    Diet Order:   Diet Order             Diet NPO time specified  Diet effective now           Diet - low sodium heart healthy                   EDUCATION NEEDS:  No education  needs have been identified at this time  Skin:  Skin Assessment: Reviewed RN Assessment  Last BM:  1/8 - type 7  Height:  Ht Readings from Last 1 Encounters:  12/28/22 '6\' 1"'$  (1.854 m)    Weight:  Wt Readings from Last 1 Encounters:  01/04/23 70.9 kg    Ideal Body Weight:  83.6 kg  BMI:  Body mass index is 20.62 kg/m.  Estimated Nutritional Needs:  Kcal:  2100-2300 kcal/d Protein:  100-130g/d Fluid:  2-2.2L/d    Ranell Patrick, RD, LDN Clinical Dietitian RD pager # available in [facility]  After hours/weekend pager # available in [facility]

## 2023-01-04 NOTE — Consult Note (Deleted)
NAME:  JEOVANNY CUADROS, MRN:  [id number], DOB:  1946/10/02, LOS: 77 ADMISSION DATE:  12/27/2022, CONSULTATION DATE:  01/04/23  REFERRING MD:  Lottie Mussel, CHIEF COMPLAINT:  L MCA CVA s/p left carotid stenting  History of Present Illness:  Keelan Tripodi is a 23 year older person living with history of T2DM, RA, ILD, hx of DVT/PE who presented for acute encephalopathy secondary to sepsis from Influenza A. Also with scattered L MCA infarcts likely embolic from complex L ICA plaque and left ICA stenosis. He underwent left carotid artery stenting with IR on 01/03/2023. 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 On cevidipine infusion for BP control  Objective   Blood pressure (!) 96/51, pulse 63, temperature 97.9 F (36.6 C), temperature source Oral, resp. rate 18, height '6\' 1"'$  (1.854 m), weight 70.9 kg, SpO2 97 %.        Intake/Output Summary (Last 24 hours) at 01/04/2023 0738 Last data filed at 01/04/2023 0600 Gross per 24 hour  Intake 912.92 ml  Output 2045 ml  Net -1132.08 ml   Filed Weights   01/02/23 0500 01/03/23 0500 01/04/23 0254  Weight: 73.9 kg 74 kg 70.9 kg    Examination: General: chronically ill appearing, in no acute distress HENT: HCAT, EMOI  Lungs: Clear to ascultation, no increased work of breathing, no crackles or wheezing Cardiovascular: RRR, normal s1 and s2 , no mrumurs  Abdomen: soft, nontender, non distended, normal BS, g tube in place Extremities:  Warm, no LE edema Neuro: alert and orient to person, place, and time, conversational, 2/5 grip strength, 4/5 of elbow, normal strength on the LUE and bilateral lower extremities   Skin: right femoral access clean and dressed, no bleeding or hematoma  Resolved Hospital Problem list     Assessment & Plan:  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 Developed worsening AMS and hypotension while working with PT suspect due to hypoperfusion. Underwent cerebral angiogram with left carotid stenting on 01/03/2023. Mentation seem to be at baseline.  - Continue ASA 81 mg and ticagrelor 90 mg BID - Continue atorvastatin 40 mg daily - monitor telemetry - Resume PT/OT/ SLP  Hypertension BP have been well controlled no elevated blood pressures, now of clevidipine this morning - goal SBP 100-140 - discontinue arterial line  Possible adrenal insuffiencey On prednisone 1 mg prior to admission foRA.  Now on slow prednisone taper due to intermittent hypotension. - decrease prednisone to 10 mg daily  Influenza A  Remains on room air. HDS. No new cough or respiratory symptoms  -continue to monitor -IS , flutter valve  Malnutrition On continuous tube feeds at home. Held for procedure ysterday - Restart tube feeds - Consult RD   T2DM -monitor CBGs -SSI  Hypothyroidism   Continue levothyroxine   Best Practice (right click and "Reselect all SmartList Selections" daily)   Diet/type: NPO DVT prophylaxis: SCD  GI prophylaxis: N/A Lines: N/A Foley:  N/A Code Status:  full code Last date of multidisciplinary goals of care discussion '[]'$   Labs   CBC: Recent Labs  Lab 01/01/23 0559 01/01/23 1541 01/02/23 0240 01/03/23 0426 01/04/23 0455  WBC 4.3 7.0 4.2 6.3 5.7  NEUTROABS  --  6.2  --   --   --   HGB 8.7* 8.7* 9.1* 9.1* 8.3*  HCT 27.2* 26.4* 26.4* 26.8* 26.0*  MCV 87.7  85.4 84.9 85.6 88.4  PLT 99* 96* 94* 108* 810    Basic Metabolic Panel: Recent Labs  Lab 12/29/22 0325 12/29/22 1625 12/30/22 0548 12/30/22 1640 12/31/22 0526 01/01/23 0559 01/02/23 0240 01/03/23 0426 01/04/23 0455  NA 140  --  130*  --  132* 129* 133* 135 138  K 2.8* 2.7* 3.4* 3.9 4.3 3.5 3.7 3.6 3.4*  CL 103  --  99  --  105 101 101 104 107  CO2 24  --  22  --  19* 21* 23 23 21*  GLUCOSE 118*  --  146*  --  149* 141* 268* 139* 108*  BUN 19  --  16  --  '22 20 19 21 19  '$ CREATININE 0.88  --  0.86  --  0.79 0.76 0.85 0.65 0.66  CALCIUM 7.9*  --  7.4*  --  7.6* 7.5* 8.1* 8.4* 8.0*  MG 1.4* 1.4* 1.9 2.1 1.7  --   --   --   --   PHOS 4.2 1.6* 2.2* 2.8 2.6  --   --   --   --    GFR: Estimated Creatinine Clearance: 77.5 mL/min (by C-G formula based on SCr of 0.66 mg/dL). Recent Labs  Lab 01/01/23 1541 01/02/23 0240 01/03/23 0426 01/04/23 0455  WBC 7.0 4.2 6.3 5.7    Liver Function Tests: Recent Labs  Lab 12/31/22 0526  AST 36  ALT 23  ALKPHOS 50  BILITOT 0.4  PROT 4.9*  ALBUMIN 2.3*   No results for input(s): "LIPASE", "AMYLASE" in the last 168 hours. No results for input(s): "AMMONIA" in the last 168 hours.  ABG    Component Value Date/Time   PHART 7.476 (H) 08/14/2012 2141   PCO2ART 25.6 (L) 08/14/2012 2141   PO2ART 101.0 (H) 08/14/2012 2141   HCO3 18.7 (L) 08/14/2012 2141   TCO2 23 12/27/2022 0942   ACIDBASEDEF 4.3 (H) 08/14/2012 2141   O2SAT 97.5 08/14/2012 2141     Coagulation Profile: Recent Labs  Lab 01/01/23 1547  INR 1.2    Cardiac Enzymes: No results for input(s): "CKTOTAL", "CKMB", "CKMBINDEX", "TROPONINI" in the last 168 hours.  HbA1C: Hemoglobin A1C  Date/Time Value Ref Range Status  11/20/2014 11:40 AM 7.5 (H) 4.2 - 6.3 % Final    Comment:    The American Diabetes Association recommends that a primary goal of therapy should be <7% and that physicians should reevaluate the treatment regimen in patients with HbA1c values  consistently >8%.    Hgb A1c MFr Bld  Date/Time Value Ref Range Status  12/27/2022 08:28 PM 6.2 (H) 4.8 - 5.6 % Final    Comment:    (NOTE)         Prediabetes: 5.7 - 6.4         Diabetes: >6.4         Glycemic control for adults with diabetes: <7.0   08/20/2022 07:09 AM 6.8 (H) 4.8 - 5.6 % Final    Comment:    (NOTE) Pre diabetes:  5.7%-6.4%  Diabetes:              >6.4%  Glycemic control for   <7.0% adults with diabetes     CBG: Recent Labs  Lab 01/03/23 1921 01/03/23 2142 01/03/23 2334 01/04/23 0329 01/04/23 0716  GLUCAP 121* 120* 117* 115* 96    Review of Systems:     Past Medical History:  He,  has a past medical history of Aortic aneurysm ([place]) (05/2017), Chronic back pain, Diabetes mellitus without complication (HCC), Dyslipidemia, GERD (gastroesophageal reflux disease), H/O hiatal hernia, Hypertension, Melanoma ([place]), PONV (postoperative nausea and vomiting), Rheumatoid arthritis ([place]), and Spasm of esophagus.   Surgical History:   Past Surgical History:  Procedure Laterality Date   aneursym repair     aneursym repair and stent placement  05/2017   BIOPSY  08/19/2022   Procedure: BIOPSY;  Surgeon: Ladene Artist, MD;  Location: [facility] END[facility];  Service: Gastroenterology;;   CARPAL TUNNEL RELEASE Bilateral    COLON SURGERY  2007   removed 18" of colon   ERCP W/ METAL STENT PLACEMENT  11/2005   Archie Endo 12/14/2005   ESOPHAGOGASTRODUODENOSCOPY (EGD) WITH ESOPHAGEAL DILATION     "several times'   ESOPHAGOGASTRODUODENOSCOPY (EGD) WITH PROPOFOL N/A 08/19/2022   Procedure: ESOPHAGOGASTRODUODENOSCOPY (EGD) WITH PROPOFOL;  Surgeon: Ladene Artist, MD;  Location: [place];  Service: Gastroenterology;  Laterality: N/A;   HAND SURGERY Bilateral    "plastic knuckles"   LAPAROSCOPIC CHOLECYSTECTOMY  01/2006   MELANOMA EXCISION     "back; left arm"   NASAL SEPTUM SURGERY     PELVIC FRACTURE SURGERY  1964   "busted in 2 places"   RESECTION DISTAL  CLAVICAL Right 05/13/2016   Procedure: DISTAL CLAVICLE EXCISION;  Surgeon: Ninetta Lights, MD;  Location: [place];  Service: Orthopedics;  Laterality: Right;   SHOULDER ARTHROSCOPY WITH ROTATOR CUFF REPAIR AND SUBACROMIAL DECOMPRESSION Right 05/13/2016   Procedure: RIGHT SHOULDER SCOPE DEBRIDEMENT, ACROMIOPLASTY, ROTATOR CUFF REPAIR; RELEASE BICEPS TENDON AND DEBRIDEMENT LABRUM;  Surgeon: Ninetta Lights, MD;  Location: [place];  Service: Orthopedics;  Laterality: Right;   TIBIA FRACTURE SURGERY Left 2006   "put a steel rod in it"     Social History:   reports that he quit smoking about 17 years ago. His smoking use included cigarettes. He has a 46.00 pack-year smoking history. He has never used smokeless tobacco. He reports current alcohol use of about 9.0 standard drinks of alcohol per week. He reports that he does not use drugs.   Family History:  His family history includes Cancer in his maternal grandmother; Heart disease in his father and mother.   Allergies No Known Allergies   Home Medications  Prior to Admission medications   Medication Sig Start Date End Date Taking? Authorizing Provider  abatacept (ORENCIA) 250 MG injection Inject 250 mg into the vein every 30 (thirty) days. Patient taking differently: Inject 750 mg into the vein every 30 (thirty) days. 02/12/21  Yes Sanjuan Dame, MD  atorvastatin (LIPITOR) 40 MG tablet Take 1 tablet (40 mg total) by mouth daily. 08/27/22  Yes Leigh Aurora, DO  famotidine (PEPCID) 20 MG tablet Take 1 tablet (20 mg total) by mouth daily. 08/27/22 08/27/23 Yes Leigh Aurora, DO  Feeding Supplies KIT 1 Box by Does not apply route daily. Patient will need pump and supplies for continuous tube feeds and 58m syringes for free water flushes 12/31/22  Yes Mapp, Tavien, MD  ferrous sulfate 300 (60 Fe)  MG/5ML syrup Take 5 mLs (300 mg total) by mouth daily. 08/27/22  Yes Leigh Aurora, DO  folic acid (FOLVITE) 1 MG tablet Take 1  tablet (1 mg total) by mouth daily. 08/27/22  Yes Leigh Aurora, DO  levothyroxine (SYNTHROID) 50 MCG tablet Take 1 tablet (50 mcg total) by mouth daily. 08/27/22  Yes Leigh Aurora, DO  magnesium oxide (MAG-OX) 400 (240 Mg) MG tablet Take 1 tablet (400 mg total) by mouth daily. 08/27/22  Yes Leigh Aurora, DO  metFORMIN (GLUCOPHAGE) 500 MG tablet Take 1 tablet (500 mg total) by mouth daily. 08/27/22  Yes Leigh Aurora, DO  Methotrexate 25 MG/ML SOSY Inject 15 mg into the skin once a week.   Yes [provider]  ondansetron (ZOFRAN) 4 MG tablet Take 1 tablet (4 mg total) by mouth every 12 (twelve) hours as needed for nausea or vomiting. 08/27/22  Yes Leigh Aurora, DO  oxyCODONE (OXY IR/ROXICODONE) 5 MG immediate release tablet Take 1 tablet (5 mg total) by mouth every 4 (four) hours as needed for moderate pain. 08/27/22  Yes Rick Duff, MD  pantoprazole (PROTONIX) 40 MG tablet Take 1 tablet (40 mg total) by mouth 2 (two) times daily. Via tube 08/27/22  Yes Rick Duff, MD  predniSONE (DELTASONE) 1 MG tablet Take 1-5 tablets (1-5 mg total) by mouth See admin instructions. 5 mg daily for 1 month  4 mg daily for 1 month, 3 mg daily for 1 month, 2 mg daily for 1 month, 1 mg daily for 1 month. 08/27/22  Yes Patel, Amar, DO  sucralfate (CARAFATE) 1 GM/10ML suspension Take 1 g by mouth 4 (four) times daily -  before meals and at bedtime.   Yes [provider]  tadalafil (CIALIS) 5 MG tablet Take 2 tablets (10 mg total) by mouth daily. 08/27/22  Yes Leigh Aurora, DO  alum & mag hydroxide-simeth (MAALOX/MYLANTA) 200-200-20 MG/5ML suspension Take 30 mLs by mouth 3 (three) times daily as needed for indigestion or heartburn. Patient not taking: Reported on 12/27/2022 08/27/22   Rick Duff, MD  aspirin 81 MG chewable tablet Place 1 tablet (81 mg total) into feeding tube daily. 01/01/23   Mapp, Claudia Desanctis, MD  clopidogrel (PLAVIX) 75 MG tablet Place 1 tablet (75 mg total) into feeding tube daily. 01/01/23   Mapp,  Claudia Desanctis, MD  diclofenac Sodium (VOLTAREN) 1 % GEL Apply 2 g topically 4 (four) times daily. Patient not taking: Reported on 12/28/2022 08/27/22   Rick Duff, MD  enoxaparin (LOVENOX) 40 MG/0.4ML injection Inject 40 mg into the skin daily. Patient not taking: Reported on 12/28/2022 08/06/22   [provider]  lidocaine (XYLOCAINE) 2 % solution Use as directed 15 mLs in the mouth or throat 3 (three) times daily as needed for mouth pain (Indigestion). Patient not taking: Reported on 12/28/2022 08/27/22   Rick Duff, MD  Nutritional Supplements (FEEDING SUPPLEMENT, OSMOLITE 1.5 CAL,) LIQD Place 1,000 mLs into feeding tube continuous. For 18 hours daily. 12/31/22   Mapp, Claudia Desanctis, MD  predniSONE (DELTASONE) 1 MG tablet Place 1 tablet (1 mg total) into feeding tube daily with breakfast. 01/01/23   Mapp, Claudia Desanctis, MD  Water For Irrigation, Sterile (FREE WATER) SOLN Place 200 mLs into feeding tube every 4 (four) hours. 12/31/22   Starlyn Skeans, MD

## 2023-01-04 NOTE — Progress Notes (Signed)
? ?  Inpatient Rehab Admissions Coordinator : ? ?Per therapy recommendations, patient was screened for CIR candidacy by Illana Nolting RN MSN.  At this time patient appears to be a potential candidate for CIR. I will place a rehab consult per protocol for full assessment. Please call me with any questions. ? ?Tameem Pullara RN MSN ?Admissions Coordinator ?[phone] ?  ?

## 2023-01-04 NOTE — Progress Notes (Signed)
PT Cancellation Note  Patient Details Name: [name] MRN: [id number] DOB: 16-Sep-1946   Cancelled Treatment:    Reason Eval/Treat Not Completed: Active bedrest order   Sandy Salaam Naudia Crosley 01/04/2023, 6:37 AM [place] Office: [phone]

## 2023-01-04 NOTE — Consult Note (Signed)
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 Also with scattered L MCA infarcts likely embolic from complex L ICA plaque and left ICA stenosis. He underwent left carotid artery stenting with IR on 01/03/2023. 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 On cevidipine infusion for BP control  Objective   Blood pressure (!) 97/51, pulse 66, temperature 97.9 F (36.6 C), temperature source Oral, resp. rate 17, height '6\' 1"'$  (1.854 m), weight 70.9 kg, SpO2 98 %.        Intake/Output Summary (Last 24 hours) at 01/04/2023 0959 Last data filed at 01/04/2023 0900 Gross per 24 hour  Intake 922.92 ml  Output 1095 ml  Net -172.08 ml    Filed Weights   01/02/23 0500 01/03/23 0500 01/04/23 0254  Weight: 73.9 kg 74 kg 70.9 kg    Examination: General: chronically ill appearing, in no acute distress HENT: HCAT, EMOI  Lungs: Clear to ascultation, no increased work of breathing, no crackles or wheezing Cardiovascular: RRR, normal s1 and s2 , no mrumurs  Abdomen: soft, nontender, non distended, normal BS, g tube in place Extremities:  Warm, no LE edema Neuro: alert and orient to person, place, and time, conversational, 2/5 grip strength, 4/5 of elbow, normal strength on the LUE and bilateral lower extremities   Skin: right femoral access clean and dressed, no bleeding or hematoma  Resolved Hospital Problem list     Assessment & Plan:  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 Developed worsening AMS and hypotension while working with PT suspect due to hypoperfusion. Underwent cerebral angiogram with left carotid stenting on 01/03/2023. Mentation seem to be at baseline.  - Continue ASA 81 mg and ticagrelor 90 mg BID - Continue atorvastatin 40 mg daily - monitor telemetry - Resume PT/OT/ SLP  Hypertension Started on clevidipine infusion overnight after carotid stenting. No elevated pressures.  Off clevidipine this morning and remains normotensive - goal SBP 100-140 - discontinue arterial line  Possible adrenal insuffiencey On prednisone 1 mg prior to admission foRA.  Now on slow prednisone taper due to intermittent hypotension. - decrease prednisone to 10 mg daily  Influenza A  Received Tamiflu. Remains on room air. HDS. No new cough or respiratory symptoms  -continue to monitor -IS , flutter valve  Malnutrition History of esophageal outlet obstruction On continuous tube feeds at home. Held for procedure ysterday - Restart tube feeds - Consult RD   T2DM -monitor CBGs -SSI  Hypothyroidism   Continue levothyroxine   Best Practice (right click  and "Reselect all SmartList Selections" daily)   Diet/type: NPO DVT prophylaxis: SCD GI prophylaxis: N/A Lines: N/A Foley:  N/A Code Status:  full code Last date of multidisciplinary goals of care discussion '[]'$   Labs   CBC: Recent Labs  Lab 01/01/23 0559 01/01/23 1541 01/02/23 0240 01/03/23 0426 01/04/23 0455  WBC 4.3 7.0 4.2 6.3 5.7   NEUTROABS  --  6.2  --   --   --   HGB 8.7* 8.7* 9.1* 9.1* 8.3*  HCT 27.2* 26.4* 26.4* 26.8* 26.0*  MCV 87.7 85.4 84.9 85.6 88.4  PLT 99* 96* 94* 108* 150     Basic Metabolic Panel: Recent Labs  Lab 12/29/22 0325 12/29/22 1625 12/30/22 0548 12/30/22 1640 12/31/22 0526 01/01/23 0559 01/02/23 0240 01/03/23 0426 01/04/23 0455  NA 140  --  130*  --  132* 129* 133* 135 138  K 2.8* 2.7* 3.4* 3.9 4.3 3.5 3.7 3.6 3.4*  CL 103  --  99  --  105 101 101 104 107  CO2 24  --  22  --  19* 21* 23 23 21*  GLUCOSE 118*  --  146*  --  149* 141* 268* 139* 108*  BUN 19  --  16  --  '22 20 19 21 19  '$ CREATININE 0.88  --  0.86  --  0.79 0.76 0.85 0.65 0.66  CALCIUM 7.9*  --  7.4*  --  7.6* 7.5* 8.1* 8.4* 8.0*  MG 1.4* 1.4* 1.9 2.1 1.7  --   --   --   --   PHOS 4.2 1.6* 2.2* 2.8 2.6  --   --   --   --     GFR: Estimated Creatinine Clearance: 77.5 mL/min (by C-G formula based on SCr of 0.66 mg/dL). Recent Labs  Lab 01/01/23 1541 01/02/23 0240 01/03/23 0426 01/04/23 0455  WBC 7.0 4.2 6.3 5.7     Liver Function Tests: Recent Labs  Lab 12/31/22 0526  AST 36  ALT 23  ALKPHOS 50  BILITOT 0.4  PROT 4.9*  ALBUMIN 2.3*    No results for input(s): "LIPASE", "AMYLASE" in the last 168 hours. No results for input(s): "AMMONIA" in the last 168 hours.  ABG    Component Value Date/Time   PHART 7.476 (H) 08/14/2012 2141   PCO2ART 25.6 (L) 08/14/2012 2141   PO2ART 101.0 (H) 08/14/2012 2141   HCO3 18.7 (L) 08/14/2012 2141   TCO2 23 12/27/2022 0942   ACIDBASEDEF 4.3 (H) 08/14/2012 2141   O2SAT 97.5 08/14/2012 2141     Coagulation Profile: Recent Labs  Lab 01/01/23 1547  INR 1.2     Cardiac Enzymes: No results for input(s): "CKTOTAL", "CKMB", "CKMBINDEX", "TROPONINI" in the last 168 hours.  HbA1C: Hemoglobin A1C  Date/Time Value Ref Range Status  11/20/2014 11:40 AM 7.5 (H) 4.2 - 6.3 % Final    Comment:    The American Diabetes Association recommends that a primary goal  of therapy should be <7% and that physicians should reevaluate the treatment regimen in patients with HbA1c values consistently >8%.    Hgb A1c MFr Bld  Date/Time Value Ref Range Status  12/27/2022 08:28 PM 6.2 (H) 4.8 - 5.6 % Final    Comment:    (NOTE)         Prediabetes: 5.7 - 6.4         Diabetes: >6.4         Glycemic control for adults with diabetes: <7.0   08/20/2022 07:09 AM  6.8 (H) 4.8 - 5.6 % Final    Comment:    (NOTE) Pre diabetes:          5.7%-6.4%  Diabetes:              >6.4%  Glycemic control for   <7.0% adults with diabetes     CBG: Recent Labs  Lab 01/03/23 1921 01/03/23 2142 01/03/23 2334 01/04/23 0329 01/04/23 0716  GLUCAP 121* 120* 117* 115* 96     Review of Systems:     Past Medical History:  He,  has a past medical history of Aortic aneurysm ([place]) (05/2017), Chronic back pain, Diabetes mellitus without complication ([place]), Dyslipidemia, GERD (gastroesophageal reflux disease), H/O hiatal hernia, Hypertension, Melanoma ([place]), PONV (postoperative nausea and vomiting), Rheumatoid arthritis ([place]), and Spasm of esophagus.   Surgical History:   Past Surgical History:  Procedure Laterality Date   aneursym repair     aneursym repair and stent placement  05/2017   BIOPSY  08/19/2022   Procedure: BIOPSY;  Surgeon: Ladene Artist, MD;  Location[facility] ENDOSCOPY;  Service: Gastroenterology;;   CARPAL TUNNEL RELEASE Bilateral    COLON SURGERY  2007   removed 18" of colon   ERCP W/ METAL STENT PLACEMENT  11/2005   Archie Endo 12/14/2005   ESOPHAGOGASTRODUODENOSCOPY (EGD) WITH ESOPHAGEAL DILATION     "several times'   ESOPHAGOGASTRODUODENOSCOPY (EGD) WITH PROPOFOL N/A 08/19/2022   Procedure: ESOPHAGOGASTRODUODENOSCOPY (EGD) WITH PROPOFOL;  Surgeon: Ladene Artist, MD;  Location: [facility];  Service: Gastroenterology;  Laterality: N/A;   HAND SURGERY Bilateral    "plastic knuckles"   LAPAROSCOPIC CHOLECYSTECTOMY  01/2006   MELANOMA EXCISION      "back; left arm"   NASAL SEPTUM SURGERY     PELVIC FRACTURE SURGERY  1964   "busted in 2 places"   RESECTION DISTAL CLAVICAL Right 05/13/2016   Procedure: DISTAL CLAVICLE EXCISION;  Surgeon: Ninetta Lights, MD;  Location: [facility];  Service: Orthopedics;  Laterality: Right;   SHOULDER ARTHROSCOPY WITH ROTATOR CUFF REPAIR AND SUBACROMIAL DECOMPRESSION Right 05/13/2016   Procedure: RIGHT SHOULDER SCOPE DEBRIDEMENT, ACROMIOPLASTY, ROTATOR CUFF REPAIR; RELEASE BICEPS TENDON AND DEBRIDEMENT LABRUM;  Surgeon: Ninetta Lights, MD;  Location: [facility];  Service: Orthopedics;  Laterality: Right;   TIBIA FRACTURE SURGERY Left 2006   "put a steel rod in it"     Social History:   reports that he quit smoking about 17 years ago. His smoking use included cigarettes. He has a 46.00 pack-year smoking history. He has never used smokeless tobacco. He reports current alcohol use of about 9.0 standard drinks of alcohol per week. He reports that he does not use drugs.   Family History:  His family history includes Cancer in his maternal grandmother; Heart disease in his father and mother.   Allergies No Known Allergies   Home Medications  Prior to Admission medications   Medication Sig Start Date End Date Taking? Authorizing Provider  abatacept (ORENCIA) 250 MG injection Inject 250 mg into the vein every 30 (thirty) days. Patient taking differently: Inject 750 mg into the vein every 30 (thirty) days. 02/12/21  Yes Sanjuan Dame, MD  atorvastatin (LIPITOR) 40 MG tablet Take 1 tablet (40 mg total) by mouth daily. 08/27/22  Yes Leigh Aurora, DO  famotidine (PEPCID) 20 MG tablet Take 1 tablet (20 mg total) by mouth daily. 08/27/22 08/27/23 Yes Leigh Aurora, DO  Feeding Supplies KIT 1 Box by Does not apply route daily. Patient will  need pump and supplies for continuous tube feeds and 72m syringes for free water flushes 12/31/22  Yes Mapp, Tavien, MD  ferrous sulfate 300 (60 Fe)  MG/5ML syrup Take 5 mLs (300 mg total) by mouth daily. 08/27/22  Yes PLeigh Aurora DO  folic acid (FOLVITE) 1 MG tablet Take 1 tablet (1 mg total) by mouth daily. 08/27/22  Yes PLeigh Aurora DO  levothyroxine (SYNTHROID) 50 MCG tablet Take 1 tablet (50 mcg total) by mouth daily. 08/27/22  Yes PLeigh Aurora DO  magnesium oxide (MAG-OX) 400 (240 Mg) MG tablet Take 1 tablet (400 mg total) by mouth daily. 08/27/22  Yes PLeigh Aurora DO  metFORMIN (GLUCOPHAGE) 500 MG tablet Take 1 tablet (500 mg total) by mouth daily. 08/27/22  Yes PLeigh Aurora DO  Methotrexate 25 MG/ML SOSY Inject 15 mg into the skin once a week.   Yes [provider]  ondansetron (ZOFRAN) 4 MG tablet Take 1 tablet (4 mg total) by mouth every 12 (twelve) hours as needed for nausea or vomiting. 08/27/22  Yes PLeigh Aurora DO  oxyCODONE (OXY IR/ROXICODONE) 5 MG immediate release tablet Take 1 tablet (5 mg total) by mouth every 4 (four) hours as needed for moderate pain. 08/27/22  Yes CRick Duff MD  pantoprazole (PROTONIX) 40 MG tablet Take 1 tablet (40 mg total) by mouth 2 (two) times daily. Via tube 08/27/22  Yes CRick Duff MD  predniSONE (DELTASONE) 1 MG tablet Take 1-5 tablets (1-5 mg total) by mouth See admin instructions. 5 mg daily for 1 month  4 mg daily for 1 month, 3 mg daily for 1 month, 2 mg daily for 1 month, 1 mg daily for 1 month. 08/27/22  Yes Patel, Amar, DO  sucralfate (CARAFATE) 1 GM/10ML suspension Take 1 g by mouth 4 (four) times daily -  before meals and at bedtime.   Yes [provider]  tadalafil (CIALIS) 5 MG tablet Take 2 tablets (10 mg total) by mouth daily. 08/27/22  Yes PLeigh Aurora DO  alum & mag hydroxide-simeth (MAALOX/MYLANTA) 200-200-20 MG/5ML suspension Take 30 mLs by mouth 3 (three) times daily as needed for indigestion or heartburn. Patient not taking: Reported on 12/27/2022 08/27/22   CRick Duff MD  aspirin 81 MG chewable tablet Place 1 tablet (81 mg total) into feeding tube daily. 01/01/23    Mapp, TClaudia Desanctis MD  clopidogrel (PLAVIX) 75 MG tablet Place 1 tablet (75 mg total) into feeding tube daily. 01/01/23   Mapp, TClaudia Desanctis MD  diclofenac Sodium (VOLTAREN) 1 % GEL Apply 2 g topically 4 (four) times daily. Patient not taking: Reported on 12/28/2022 08/27/22   CRick Duff MD  enoxaparin (LOVENOX) 40 MG/0.4ML injection Inject 40 mg into the skin daily. Patient not taking: Reported on 12/28/2022 08/06/22   [provider]  lidocaine (XYLOCAINE) 2 % solution Use as directed 15 mLs in the mouth or throat 3 (three) times daily as needed for mouth pain (Indigestion). Patient not taking: Reported on 12/28/2022 08/27/22   CRick Duff MD  Nutritional Supplements (FEEDING SUPPLEMENT, OSMOLITE 1.5 CAL,) LIQD Place 1,000 mLs into feeding tube continuous. For 18 hours daily. 12/31/22   Mapp, TClaudia Desanctis MD  predniSONE (DELTASONE) 1 MG tablet Place 1 tablet (1 mg total) into feeding tube daily with breakfast. 01/01/23   Mapp, TClaudia Desanctis MD  Water For Irrigation, Sterile (FREE WATER) SOLN Place 200 mLs into feeding tube every 4 (four) hours. 12/31/22   MStarlyn Skeans MD

## 2023-01-05 ENCOUNTER — Encounter (HOSPITAL_COMMUNITY): Payer: Self-pay | Admitting: Radiology

## 2023-01-05 LAB — CBC
HCT: 25.8 % — ABNORMAL LOW (ref 39.0–52.0)
Hemoglobin: 8.5 g/dL — ABNORMAL LOW (ref 13.0–17.0)
MCH: 28.8 pg (ref 26.0–34.0)
MCHC: 32.9 g/dL (ref 30.0–36.0)
MCV: 87.5 fL (ref 80.0–100.0)
Platelets: 142 10*3/uL — ABNORMAL LOW (ref 150–400)
RBC: 2.95 MIL/uL — ABNORMAL LOW (ref 4.22–5.81)
RDW: 17.7 % — ABNORMAL HIGH (ref 11.5–15.5)
WBC: 6.9 10*3/uL (ref 4.0–10.5)
nRBC: 0 % (ref 0.0–0.2)

## 2023-01-05 LAB — BASIC METABOLIC PANEL
Anion gap: 9 (ref 5–15)
BUN: 26 mg/dL — ABNORMAL HIGH (ref 8–23)
CO2: 21 mmol/L — ABNORMAL LOW (ref 22–32)
Calcium: 8.4 mg/dL — ABNORMAL LOW (ref 8.9–10.3)
Chloride: 106 mmol/L (ref 98–111)
Creatinine, Ser: 0.67 mg/dL (ref 0.61–1.24)
GFR, Estimated: >60 mL/min (ref >60)
Glucose, Bld: 257 mg/dL — ABNORMAL HIGH (ref 70–99)
Potassium: 4 mmol/L (ref 3.5–5.1)
Sodium: 136 mmol/L (ref 135–145)

## 2023-01-05 LAB — GLUCOSE, CAPILLARY
Glucose-Capillary: 156 mg/dL — ABNORMAL HIGH (ref 70–99)
Glucose-Capillary: 183 mg/dL — ABNORMAL HIGH (ref 70–99)
Glucose-Capillary: 228 mg/dL — ABNORMAL HIGH (ref 70–99)

## 2023-01-05 LAB — PHOSPHORUS: Phosphorus: 2.1 mg/dL — ABNORMAL LOW (ref 2.5–4.6)

## 2023-01-05 LAB — MAGNESIUM: Magnesium: 1.7 mg/dL (ref 1.7–2.4)

## 2023-01-05 MED ORDER — POTASSIUM & SODIUM PHOSPHATES 280-160-250 MG PO PACK: 1.0000 | PACK | Freq: Two times a day (BID) | ORAL | Status: DC

## 2023-01-05 MED ORDER — POTASSIUM & SODIUM PHOSPHATES 280-160-250 MG PO PACK
1.0000 | PACK | Freq: Two times a day (BID) | ORAL | Status: AC
  Administered 2023-01-05 (×2): 1
  Filled 2023-01-05 (×2): qty 1

## 2023-01-05 MED ORDER — TICAGRELOR 90 MG PO TABS
90.0000 mg | ORAL_TABLET | Freq: Two times a day (BID) | ORAL | Status: DC
  Administered 2023-01-05 – 2023-01-06 (×2): 90 mg
  Filled 2023-01-05 (×2): qty 1

## 2023-01-05 NOTE — Progress Notes (Signed)
  Inpatient Rehabilitation Admissions Coordinator   Met with patient at bedside and spoke with his son, [name] by phone for rehab assessment. We discussed goals and expectations of a possible CIR admit.  Prior to 9/23 patient lived independently at home alone. Went to SNF 08/27/22 at [facility] until 12/17/22. Discharged home with his ex wife, [name], who can provide 24/7 assist. They prefer CIR for rehab. I discussed with son and patient, that if he fails to progress to discharge directly home with ex wife after CIR, they may have to pay privately to return to SNF, as he has used majority of his SNF days for this benefit period. I will discuss case with Rehab MD to determine if patient will be a candidate for CIR admit rather than SNF. I feel patient could benefit from CIR admit. I will follow up tomorrow.  Please call me with any questions.   Danne Baxter, RN, MSN Rehab Admissions Coordinator [phone]

## 2023-01-05 NOTE — Anesthesia Postprocedure Evaluation (Signed)
Anesthesia Post Note  Patient: [name]  Procedure(s) Performed: IR WITH ANESTHESIA     Patient location during evaluation: PACU Anesthesia Type: MAC Level of consciousness: awake and alert Pain management: pain level controlled Vital Signs Assessment: post-procedure vital signs reviewed and stable Respiratory status: spontaneous breathing, nonlabored ventilation, respiratory function stable and patient connected to nasal cannula oxygen Cardiovascular status: stable and blood pressure returned to baseline Postop Assessment: no apparent nausea or vomiting Anesthetic complications: no   No notable events documented.  Last Vitals:  Vitals:   01/05/23 0200 01/05/23 0500  BP: (!) 115/58 (!) 134/56  Pulse: (!) 52 (!) 48  Resp: 17 (!) 22  Temp:  (!) 36.4 C  SpO2: 96% 96%    Last Pain:  Vitals:   01/05/23 0500  TempSrc: Oral  PainSc:                  Tiajuana Amass

## 2023-01-05 NOTE — Inpatient Diabetes Management (Signed)
Inpatient Diabetes Program Recommendations  AACE/ADA: New Consensus Statement on Inpatient Glycemic Control  Target Ranges:  Prepandial:   less than 140 mg/dL      Peak postprandial:   less than 180 mg/dL (1-2 hours)      Critically ill patients:  140 - 180 mg/dL    Latest Reference Range & Units 01/04/23 07:16 01/04/23 11:39 01/04/23 15:07 01/04/23 19:06 01/04/23 21:14  Glucose-Capillary 70 - 99 mg/dL 96 192 (H) 241 (H) 266 (H) 268 (H)   Review of Glycemic Control  Diabetes history: DM2 Outpatient Diabetes medications: Metformin 500 mg daily Current orders for Inpatient glycemic control: Novolog 0-9 units TID with meals, Novolog 0-5 units QHS; Prednisone 10 QAM, Osmolite @ 85 ml/hr  Inpatient Diabetes Program Recommendations:    Insulin: Please consider ordering Novolog 2 units Q4H for tube feeding coverage. If tube feeding is stopped or held then Novolog tube feeding coverage should also be stopped or held.  Thanks, Barnie Alderman, RN, MSN, [place] Diabetes Coordinator Inpatient Diabetes Program [phone] (Team Pager from 8am to [place])

## 2023-01-05 NOTE — Progress Notes (Signed)
Occupational Therapy Treatment Patient Details Name: [name] MRN: [id number] DOB: September 24, 1946 Today's Date: 01/05/2023   History of present illness 77 y.o. male admitted 1/1 after found on floor.  NTI:[id number] foci of restricted diffusion left frontal lobe and left parietal lobe.  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  Pt (+) flu A and possible aspiration PNA. 1/5 code stroke with increased Rt weakness. 1/8 carotid stent in IR. PMHx: RA, ILD, DVT/PE, T2DM, HTN, HLD, Esophageal outlet obstructions s/p multiple dilatations and GJ tube placement for FTT, severe protein calorie malnutrition, AAA L Illiac artery aneurysm s/p EVAR   OT comments  Patient received in supine and agreeable to getting into recliner. While moving condom catheter bag and fecal pouch bag to position for transfer patient declined getting OOB due to fear of wetting bed.  Patient demonstrated good gains with RUE ROM with patient demonstrating AROM for shoulder from AAROM from last session. Patient continues to demonstrate limited finger flexion and extension but states he perform HEP daily. Discharge recommendations changed to AIR for patient to continue to receive skilled OT to address RUE functional use, self care and functional transfers to increase independence and decrease burden of care. Acute OT to continue to follow.    Recommendations for follow up therapy are one component of a multi-disciplinary discharge planning process, led by the attending physician.  Recommendations may be updated based on patient status, additional functional criteria and insurance authorization.    Follow Up Recommendations  Acute inpatient rehab (3hours/day)     Assistance Recommended at Discharge Frequent or constant Supervision/Assistance  Patient can return home with the following  A lot of help with walking and/or transfers;A lot of help with bathing/dressing/bathroom;Assistance with  cooking/housework;Direct supervision/assist for medications management;Direct supervision/assist for financial management;Assist for transportation;Help with stairs or ramp for entrance   Equipment Recommendations  None recommended by OT    Recommendations for Other Services      Precautions / Restrictions Precautions Precautions: Fall Precaution Comments: peg tube, flu +, rectal pouch Restrictions Weight Bearing Restrictions: No       Mobility Bed Mobility Overal bed mobility: Needs Assistance Bed Mobility: Rolling Rolling: Supervision         General bed mobility comments: rolling in bed to position self    Transfers Overall transfer level: Needs assistance                 General transfer comment: declined getting OOB due to condum catheter and rectal pouch     Balance                                           ADL either performed or assessed with clinical judgement   ADL Overall ADL's : Needs assistance/impaired                                       General ADL Comments: focused on RUE HEP    Extremity/Trunk Assessment Upper Extremity Assessment RUE Deficits / Details: pt maintains hand and digit flexion with very limited strength into extension RUE Sensation: WNL RUE Coordination: decreased fine motor            Vision       Perception     Praxis  Cognition Arousal/Alertness: Awake/alert Behavior During Therapy: Flat affect, Agitated Overall Cognitive Status: Within Functional Limits for tasks assessed                                 General Comments: agitated when preparing to get OOB due to rectal pouch and condom catheter.        Exercises Exercises: General Upper Extremity General Exercises - Upper Extremity Shoulder Flexion: AROM, Left, 10 reps Shoulder ABduction: AROM, Right, 10 reps Shoulder ADduction: AROM, Right, 10 reps Shoulder Horizontal ABduction: AROM, Right, 10  reps Elbow Flexion: AROM, Right, 10 reps Elbow Extension: AROM, Right, 10 reps Wrist Flexion: AROM, Right, 10 reps Wrist Extension: AROM, Right, 10 reps Digit Composite Flexion: Right, AROM Composite Extension: Right, AROM Hand Exercises Forearm Supination: AROM, Right, 10 reps    Shoulder Instructions       General Comments      Pertinent Vitals/ Pain       Pain Assessment Pain Assessment: Faces Faces Pain Scale: No hurt Pain Intervention(s): Monitored during session  Home Living                                          Prior Functioning/Environment              Frequency  Min 2X/week        Progress Toward Goals  OT Goals(current goals can now be found in the care plan section)  Progress towards OT goals: Progressing toward goals  Acute Rehab OT Goals Patient Stated Goal: get better OT Goal Formulation: With patient Time For Goal Achievement: 01/11/23 Potential to Achieve Goals: Fair ADL Goals Pt Will Perform Eating: with modified independence;sitting;with adaptive utensils Pt Will Perform Grooming: with modified independence;standing Pt Will Perform Upper Body Bathing: with set-up;sitting Pt Will Perform Lower Body Bathing: with supervision;sit to/from stand Pt Will Perform Upper Body Dressing: with supervision;sitting Pt Will Transfer to Toilet: with modified independence;regular height toilet;ambulating Pt Will Perform Toileting - Clothing Manipulation and hygiene: with modified independence;sit to/from stand Pt/caregiver will Perform Home Exercise Program: Right Upper extremity;Increased strength;Increased ROM;With written HEP provided;Independently  Plan Discharge plan remains appropriate    Co-evaluation                 AM-PAC OT "6 Clicks" Daily Activity     Outcome Measure   Help from another person eating meals?: A Little Help from another person taking care of personal grooming?: A Little Help from another person  toileting, which includes using toliet, bedpan, or urinal?: A Lot Help from another person bathing (including washing, rinsing, drying)?: A Lot Help from another person to put on and taking off regular upper body clothing?: A Lot Help from another person to put on and taking off regular lower body clothing?: A Lot 6 Click Score: 14    End of Session    OT Visit Diagnosis: Unsteadiness on feet (R26.81);Muscle weakness (generalized) (M62.81);Pain   Activity Tolerance Patient tolerated treatment well   Patient Left in bed;with call bell/phone within reach;with bed alarm set   Nurse Communication Mobility status (declining getting OOB)        Time: 2202-5427 OT Time Calculation (min): 31 min  Charges: OT General Charges $OT Visit: 1 Visit OT Treatments $Therapeutic Exercise: 23-37 mins  Lodema Hong, [place]  Office [phone]   Trixie Dredge 01/05/2023, 2:26 PM

## 2023-01-05 NOTE — Progress Notes (Signed)
Physical Therapy Treatment Patient Details Name: [name] MRN: [id number] DOB: Jul 19, 1946 Today's Date: 01/05/2023   History of Present Illness 77 y.o. male admitted 1/1 after found on floor.  WUJ:[id number] foci of restricted diffusion left frontal lobe and left parietal lobe.  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  Pt (+) flu A and possible aspiration PNA. 1/5 code stroke with increased Rt weakness. 1/8 carotid stent in IR. 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 Pt ambulating in the room x4 trials, with seated rest breaks due to fatigue. Pt provided cueing for proper hand placement for stand>sit transfers, as well as maintaining RUE on the RW. Pt ambulating ~20 feet per trial with use of RW and CGA. Pt will continue to benefit from skilled acute PT at this time to continue to progress mobility and endurance, as well as activity tolerance, discharge recommendations remain appropriate.     Recommendations for follow up therapy are one component of a multi-disciplinary discharge planning process, led by the attending physician.  Recommendations may be updated based on patient status, additional functional criteria and insurance authorization.  Follow Up Recommendations  Acute inpatient rehab (3hours/day)     Assistance Recommended at Discharge Frequent or constant Supervision/Assistance  Patient can return home with the following A little help with walking and/or transfers;A lot of help with bathing/dressing/bathroom;Assistance with cooking/housework;Direct supervision/assist for medications management;Assist for transportation;Assistance with feeding   Equipment Recommendations  Rolling walker (2 wheels)     Recommendations for Other Services       Precautions / Restrictions Precautions Precautions: Fall Precaution Comments: peg tube, flu +, rectal pouch Restrictions Weight Bearing Restrictions: No     Mobility  Bed Mobility Overal bed mobility: Needs Assistance Bed Mobility: Sit to Supine, Supine to Sit     Supine to sit: Min guard, HOB elevated Sit to supine: Min guard, HOB elevated   General bed mobility comments: with use of bedrail Patient Response: Cooperative  Transfers Overall transfer level: Needs assistance Equipment used: Rolling walker (2 wheels) Transfers: Sit to/from Stand Sit to Stand: Min guard                Ambulation/Gait Ambulation/Gait assistance: Min guard Gait Distance (Feet): 20 Feet (x4 trials with seated rest breaks) Assistive device: Rolling walker (2 wheels) Gait Pattern/deviations: Trunk flexed, Decreased stride length Gait velocity: WFL     General Gait Details: downward gaze, intermittent cueing required for maintenance of R hand on RW, decreased R foot clearance with fatigue   Stairs             Wheelchair Mobility    Modified Rankin (Stroke Patients Only)       Balance Overall balance assessment: Needs assistance Sitting-balance support: Feet supported Sitting balance-Leahy Scale: Good     Standing balance support: Bilateral upper extremity supported Standing balance-Leahy Scale: Fair                              Cognition Arousal/Alertness: Awake/alert Behavior During Therapy: WFL for tasks assessed/performed Overall Cognitive Status: Within Functional Limits for tasks assessed  General Comments: becoming pleasant throughout session        Exercises      General Comments        Pertinent Vitals/Pain Pain Assessment Pain Assessment: 0-10 Pain Score: 0-No pain    Home Living                          Prior Function             PT Goals (current goals can now be found in the care plan section) Acute Rehab PT Goals Patient Stated Goal: I need to keep moving PT Goal Formulation: With patient Time For Goal Achievement: 01/18/23 Potential to Achieve Goals: Good Progress towards PT goals: Progressing toward goals    Frequency    Min 4X/week      PT Plan Current plan remains appropriate    Co-evaluation              AM-PAC PT "6 Clicks" Mobility   Outcome Measure  Help needed turning from your back to your side while in a flat bed without using bedrails?: A Little Help needed moving from lying on your back to sitting on the side of a flat bed without using bedrails?: A Little Help needed moving to and from a bed to a chair (including a wheelchair)?: A Little Help needed standing up from a chair using your arms (e.g., wheelchair or bedside chair)?: A Little Help needed to walk in hospital room?: A Little Help needed climbing 3-5 steps with a railing? : A Lot 6 Click Score: 17    End of Session Equipment Utilized During Treatment: Gait belt Activity Tolerance: Patient tolerated treatment well Patient left: with call bell/phone within reach;in bed;with bed alarm set Nurse Communication: Mobility status PT Visit Diagnosis: Unsteadiness on feet (R26.81);Muscle weakness (generalized) (M62.81);Difficulty in walking, not elsewhere classified (R26.2)     Time: 6503-5465 PT Time Calculation (min) (ACUTE ONLY): 20 min  Charges:  $Therapeutic Activity: 8-22 mins                     Charlynne Cousins, PT DPT Acute Rehabilitation Services Office [phone]    Luvenia Heller 01/05/2023, 5:13 PM

## 2023-01-05 NOTE — Plan of Care (Signed)
Problem: Education: Goal: Knowledge of disease or condition will improve Outcome: Progressing Goal: Knowledge of secondary prevention will improve (MUST DOCUMENT ALL) Outcome: Progressing Goal: Knowledge of patient specific risk factors will improve [place]ta [place] N/A or DELETE if not current risk factor) Outcome: Progressing   Problem: Ischemic Stroke/TIA Tissue Perfusion: Goal: Complications of ischemic stroke/TIA will be minimized Outcome: Progressing   Problem: Coping: Goal: Will verbalize positive feelings about self Outcome: Progressing Goal: Will identify appropriate support needs Outcome: Progressing   Problem: Health Behavior/Discharge Planning: Goal: Ability to manage health-related needs will improve Outcome: Progressing Goal: Goals will be collaboratively established with patient/family Outcome: Progressing   Problem: Self-Care: Goal: Ability to participate in self-care as condition permits will improve Outcome: Progressing Goal: Verbalization of feelings and concerns over difficulty with self-care will improve Outcome: Progressing Goal: Ability to communicate needs accurately will improve Outcome: Progressing   Problem: Nutrition: Goal: Risk of aspiration will decrease Outcome: Progressing Goal: Dietary intake will improve Outcome: Progressing   Problem: Education: Goal: Knowledge of General Education information will improve Description: Including pain rating scale, medication(s)/side effects and non-pharmacologic comfort measures Outcome: Progressing   Problem: Health Behavior/Discharge Planning: Goal: Ability to manage health-related needs will improve Outcome: Progressing   Problem: Clinical Measurements: Goal: Ability to maintain clinical measurements within normal limits will improve Outcome: Progressing Goal: Will remain free from infection Outcome: Progressing Goal: Diagnostic test results will improve Outcome: Progressing Goal: Respiratory  complications will improve Outcome: Progressing Goal: Cardiovascular complication will be avoided Outcome: Progressing   Problem: Activity: Goal: Risk for activity intolerance will decrease Outcome: Progressing   Problem: Nutrition: Goal: Adequate nutrition will be maintained Outcome: Progressing   Problem: Coping: Goal: Level of anxiety will decrease Outcome: Progressing   Problem: Elimination: Goal: Will not experience complications related to bowel motility Outcome: Progressing Goal: Will not experience complications related to urinary retention Outcome: Progressing   Problem: Pain Managment: Goal: General experience of comfort will improve Outcome: Progressing   Problem: Safety: Goal: Ability to remain free from injury will improve Outcome: Progressing   Problem: Skin Integrity: Goal: Risk for impaired skin integrity will decrease Outcome: Progressing   Problem: Education: Goal: Ability to describe self-care measures that may prevent or decrease complications (Diabetes Survival Skills Education) will improve Outcome: Progressing Goal: Individualized Educational Video(s) Outcome: Progressing   Problem: Coping: Goal: Ability to adjust to condition or change in health will improve Outcome: Progressing   Problem: Fluid Volume: Goal: Ability to maintain a balanced intake and output will improve Outcome: Progressing   Problem: Health Behavior/Discharge Planning: Goal: Ability to identify and utilize available resources and services will improve Outcome: Progressing Goal: Ability to manage health-related needs will improve Outcome: Progressing   Problem: Metabolic: Goal: Ability to maintain appropriate glucose levels will improve Outcome: Progressing   Problem: Nutritional: Goal: Maintenance of adequate nutrition will improve Outcome: Progressing Goal: Progress toward achieving an optimal weight will improve Outcome: Progressing   Problem: Skin  Integrity: Goal: Risk for impaired skin integrity will decrease Outcome: Progressing   Problem: Tissue Perfusion: Goal: Adequacy of tissue perfusion will improve Outcome: Progressing   Problem: Education: Goal: Understanding of CV disease, CV risk reduction, and recovery process will improve Outcome: Progressing Goal: Individualized Educational Video(s) Outcome: Progressing   Problem: Activity: Goal: Ability to return to baseline activity level will improve Outcome: Progressing   Problem: Cardiovascular: Goal: Ability to achieve and maintain adequate cardiovascular perfusion will improve Outcome: Progressing Goal: Vascular access site(s) Level 0-1 will be maintained Outcome: Progressing  Problem: Health Behavior/Discharge Planning: Goal: Ability to safely manage health-related needs after discharge will improve Outcome: Progressing   

## 2023-01-05 NOTE — Progress Notes (Signed)
Subjective:  Patient denies any chest pain and endorses SOB that is the same as yesterday. Denies any new complaints. Discussed plan for likely CIR in the near future.   Objective:  Vital signs in last 24 hours: Vitals:   01/04/23 2100 01/05/23 0000 01/05/23 0200 01/05/23 0500  BP: (!) 109/58 137/64 (!) 115/58 (!) 134/56  Pulse: (!) 55 (!) 53 (!) 52 (!) 48  Resp: '15 19 17 '$ (!) 22  Temp:  98.1 F (36.7 C)  (!) 97.5 F (36.4 C)  TempSrc:  Oral  Oral  SpO2: 100% 99% 96% 96%  Weight:      Height:       Weight change:   Intake/Output Summary (Last 24 hours) at 01/05/2023 1405 Last data filed at 01/04/2023 1907 Gross per 24 hour  Intake 270 ml  Output 850 ml  Net -580 ml   Physical Exam General: Alert, conversational CV: RRR, no murmurs, rubs, or gallops  Pulm: Normal respiratory effort. Lungs CTA bilaterally Skin: Warm and dry Ext: No LE edema Neuro: Alert and oriented. 2/5 grip in right hand, intact range of motion in right wrist. 5/5 grip in left hand.  Psych: Normal mood and affect  Assessment/Plan:  Principal Problem:   Acute encephalopathy Active Problems:   Sepsis without acute organ dysfunction (HCC)   Influenza A   Cerebrovascular accident (CVA) ([place])   Protein-calorie malnutrition, severe  [name] is a 77 year old man with medical history significant for PMH of RA, ILD, Hx of DVT/PE, T2DM, HTN, HLD, Esophageal outlet obstructions s/p multiple dilatations and GJ tube placement for FTT, severe protein calorie malnutrition, AAA L [place] artery aneurysm s/p EVAR admitted for acute encephalopathy and CVA.   Left MCA CVA  Patient is on POD#2 of Cerebral angiogram and L Carotid stent, patient transferred to floor status yesterday. Patient has remained stable and restarted with PT/OT and tube feeds. Patient is doing well from a medical standpoint, and will need further rehabilitation post-CVA. PT recommends acute patient rehab following discharge, patient is  being evaluated for possible admission to CIR. Patient tapered down to '10mg'$  prednisone from '40mg'$  yesterday, will discharge on taper.  -F/U with CIR -Ticagrelor '90mg'$  BID -ASA '81mg'$  -Atorvastatin '40mg'$  daily per tube -Prednisone 10 mg daily per tube -Resume PT/OT -Neuro checks -Telemetry  Malnutrition  Hypokalemia Hypomagnesemia - resolved Hypophosphatemia - resolved Phosphorus 2.1 today, repleting today. K 4.0, Mag 1.7.  -Phos-NaK 280-160-250 mg packet x2 per tube -Recheck phosphorus tomorrow -Osmolite 1000 mL (85 mL/hr) per tube -Prosouce 60 mL daily per tube -Free water flushes 200 mL q4 hours per tube  T2DM   A1c 6.2%. PTA on metformin. Fasting glucose 257 this morning, likely from restarting tube feeds yesterday. Will hold on changing regimen pending possible discharge to CIR tomorrow. -Trend CBGs  - SSI sensitive w/ HS coverage  Sepsis with Acute Encephalopathy Recent hx of pneumonia Influenza A Patient remains hemodynamically stable. No leukocytosis. Satting well on RA. Discussed continuing to use spirometer/flutter valve. -Continue incentive spirometer/flutter valve -Continue monitor respiratory status   Hypothyroidism  Takes levothyroxine at home. TSH mildly elevated, but suspect secondary to acute illness.  -Levothyroxine 50 mcg daily, per tube  Thrombocytopenia - resolved  Platelets remain stable at 142 today, mildly decreased from 150 yesterday.   Esophageal Outlet Obstruction with PEG tube   Plan for patient to follow up with outpatient GI for further workup if needed.       Dispo Possible discharge to CIR  tomorrow. PT/OT recommend rolling walker.    Diet: Tube feeds Bowel: none VTE: none IVF: None Code: Full   Prior to Admission Living Arrangement: at home w/ ex-wife  Anticipated Discharge Location: TBD Barriers to Discharge: continued management Dispo: Anticipated discharge in approximately less than 2 day(s).     LOS: 9 days   Spero Curb,  Medical Student 01/05/2023, 2:05 PM  Attestation for Student Documentation:  I personally was present and performed or re-performed the history, physical exam and medical decision-making activities of this service and have verified that the service and findings are accurately documented in the student's note.  Starlyn Skeans, MD 01/05/2023, 2:38 PM

## 2023-01-05 NOTE — PMR Pre-admission (Signed)
PMR Admission Coordinator Pre-Admission Assessment  Patient: [name] is an 77 y.o., male MRN: [id number] DOB: 07-Oct-1946 Height: '6\' 1"'$  (185.4 cm) Weight: 75.7 kg  Insurance Information HMO:     PPO:      PCP:      IPA:      80/20:      OTHER:  PRIMARY: Medicare a and b      Policy#: [id number]      Subscriber: pt Benefits:  Phone #: passport one source     Name: 1/10 Eff. Date: 09/26/2010     Deduct: $1632      Out of Pocket Max: none CIR: 100%  Has used approximately 50 hospital days since 08/27/22    SNF: Has used approximately 68 SNF days since 08/27/22 ( [facility]) Outpatient: 80%     Co-Pay: 20% Home Health: 100%      Co-Pay: none DME: 80%     Co-Pay: 20% Providers: in network  SECONDARY: BCBS supplement      Policy#: [id number]  Financial Counselor:       Phone#:   The "Data Collection Information Summary" for patients in Inpatient Rehabilitation Facilities with attached "Privacy Act [place] Records" was provided and verbally reviewed with: Patient and Family  Emergency Contact Information Contact Information     Name Relation Home Work Mobile   [place] Son   [phone]   Ardith, Lewman   [phone]      Current Medical History  Patient Admitting Diagnosis: CVA  History of Present Illness: 77 year old male with history of aortic aneurysm, DM, dyslipidemia, GERD, Hiatal hernia, HTN, Rheumatoid arthritis on immunosuppression with Orencia and methotrexate as well as prednisone, 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 On arrival noted fever 101.6. Respiratory panel positive for influenza A.   Neurology consulted. 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 CVA felt to atheroembolic in the setting of his left carotid ulcerated plaque as well as severe left cavernous segment stenosis. Recommend dual  antiplatelet therapy with Plavix and ASA for 3 months followed by ASA alone. Lovenox for DVT prophylaxis. Home med of atorvastatin resumed. Hgb A1c 6.8 with home med metformin resumed. Prednisone tapered. Resumed PEG feeds per Gtube. Recent history of PNA, influenza a. Continue IS/flutter valve.Resumed home levothyroxine.   Was to be discharged 1/5 but became hypotensive with neurologic symptoms quickly resolving after fluid bolus. Therefore previous carotid stent that was to be done as outpatient was sought. Loading dose of Ticagrelor begun.Underwent Left carotid stent on 1/8.   Complete NIHSS TOTAL: 0  Patient's medical record from [facility] has been reviewed by the rehabilitation admission coordinator and physician.  Past Medical History  Past Medical History:  Diagnosis Date   Aortic aneurysm ([place]) 05/2017   Chronic back pain    Diabetes mellitus without complication (HCC)    Dyslipidemia    GERD (gastroesophageal reflux disease)    H/O hiatal hernia    Hypertension    Melanoma ([place])    "back; left arm"   PONV (postoperative nausea and vomiting)    Rheumatoid arthritis ([place])    Spasm of esophagus    Has the patient had major surgery during 100 days prior to admission? Yes  Family History   family history includes Cancer in his maternal grandmother; Heart disease in his father and mother.  Current Medications  Current Facility-Administered Medications:    acetaminophen (TYLENOL) tablet 650 mg, 650 mg, Oral, Q4H PRN **OR** acetaminophen (TYLENOL) 160 MG/5ML solution 650 mg, 650 mg, Per Tube, Q4H PRN, 650 mg at 01/04/23 0802 **OR** acetaminophen (TYLENOL) suppository 650 mg, 650 mg, Rectal, Q4H PRN, de Sindy Messing, [place], MD   aspirin chewable tablet 81 mg, 81 mg, Per Tube, Daily, Angelique Blonder, DO, 81 mg at 01/06/23 0917   atorvastatin (LIPITOR) tablet 40 mg, 40 mg, Per Tube, Daily, Mapp, Tavien, MD, 40 mg at 01/06/23 0917   Chlorhexidine Gluconate Cloth 2 %  PADS 6 each, 6 each, Topical, Daily, Lottie Mussel, MD, 6 each at 01/06/23 0918   feeding supplement (OSMOLITE 1.5 CAL) liquid 1,000 mL, 1,000 mL, Per Tube, Q24H, Delene Ruffini, MD, Infusion Verify at 01/06/23 0400   feeding supplement (PROSource TF20) liquid 60 mL, 60 mL, Per Tube, Daily, Iona Beard, MD, 60 mL at 01/06/23 0917   free water 150 mL, 150 mL, Per Tube, Q4H, G[place], Rachell Cipro, MD, 150 mL at 01/06/23 0917   insulin aspart (novoLOG) injection 0-5 Units, 0-5 Units, Subcutaneous, QHS, Patel, Amar, DO, 3 Units at 01/04/23 2117   insulin aspart (novoLOG) injection 0-9 Units, 0-9 Units, Subcutaneous, TID WC, Patel, Amar, DO, 3 Units at 01/06/23 5188   levothyroxine (SYNTHROID) tablet 50 mcg, 50 mcg, Per Tube, Q0600, Mapp, Tavien, MD, 50 mcg at 01/06/23 0528   Oral care mouth rinse, 15 mL, Mouth Rinse, 4 times per day, Velna Ochs, MD, 15 mL at 01/05/23 2238   Oral care mouth rinse, 15 mL, Mouth Rinse, PRN, Velna Ochs, MD, 15 mL at 01/01/23 1139   predniSONE (DELTASONE) tablet 10 mg, 10 mg, Per Tube, Q breakfast, Iona Beard, MD, 10 mg at 01/06/23 4166   thiamine (VITAMIN B1) tablet 100 mg, 100 mg, Per Tube, Daily, Lottie Mussel, MD, 100 mg at 01/06/23 0630   [COMPLETED] ticagrelor (BRILINTA) tablet 180 mg, 180 mg, Oral, Once, 180 mg at 01/01/23 0838 **FOLLOWED BY** ticagrelor (BRILINTA) tablet 90 mg, 90 mg, Per Tube, BID, Lottie Mussel, MD, 90 mg at 01/06/23 0916  Patients Current Diet:  Diet Order             Diet NPO time specified  Diet effective now           Diet - low sodium heart healthy                  Precautions / Restrictions Precautions Precautions: Fall Precaution Comments: peg tube, flu +, rectal pouch Restrictions Weight Bearing Restrictions: No   Has the patient had 2 or more falls or a fall with injury in the past year? Yes  Prior Activity Level Limited Community (1-2x/wk): supervision to min assist since 12/23. Was Independent  prior to 9/23  Prior Functional Level Self Care: Did the patient need help bathing, dressing, using the toilet or eating? Needed some help  Indoor Mobility: Did the patient need assistance with walking from room to room (with or without device)? Needed some help  Stairs: Did the patient need assistance with internal or external stairs (with or without device)? Needed some help  Functional Cognition: Did the patient need help planning regular tasks such as shopping or remembering to take medications? Needed some help  Patient Information Are you of Hispanic, Latino/a,or Spanish origin?: A. No, not of Hispanic, Latino/a, or Spanish origin What is your race?: A. White Do you need or want an interpreter to communicate with a doctor or health care  staff?: 0. No  Patient's Response To:  Health Literacy and Transportation Is the patient able to respond to health literacy and transportation needs?: Yes Health Literacy - How often do you need to have someone help you when you read instructions, pamphlets, or other written material from your doctor or pharmacy?: Sometimes In the past 12 months, has lack of transportation kept you from medical appointments or from getting medications?: No In the past 12 months, has lack of transportation kept you from meetings, work, or from getting things needed for daily living?: No  Development worker, international aid / [place] Devices/Equipment: None Home Equipment: Shower seat, Hand held shower head, Rollator (4 wheels)  Prior Device Use: Indicate devices/aids used by the patient prior to current illness, exacerbation or injury? Walker  Current Functional Level Cognition  Arousal/Alertness: Awake/alert Overall Cognitive Status: Within Functional Limits for tasks assessed Difficult to assess due to: Impaired communication Orientation Level: Oriented X4 General Comments: becoming pleasant throughout session Attention: Sustained Sustained Attention:  Impaired Sustained Attention Impairment: Verbal basic Memory: Impaired Memory Impairment: Storage deficit, Retrieval deficit, Decreased recall of new information Awareness: Impaired Awareness Impairment: Emergent impairment, Anticipatory impairment Problem Solving: Impaired Problem Solving Impairment: Verbal complex Safety/Judgment: Impaired    Extremity Assessment (includes Sensation/Coordination)  Upper Extremity Assessment: RUE deficits/detail RUE Deficits / Details: pt maintains hand and digit flexion with very limited strength into extension RUE Sensation: WNL RUE Coordination: decreased fine motor  Lower Extremity Assessment: Defer to PT evaluation RLE Deficits / Details: R hio has incoordination issue RLE Coordination: decreased gross motor    ADLs  Overall ADL's : Needs assistance/impaired Eating/Feeding: Set up, Sitting Grooming: Wash/dry hands, Wash/dry face, Moderate assistance Grooming Details (indicate cue type and reason): simulated Upper Body Bathing: Minimal assistance, Sitting Upper Body Bathing Details (indicate cue type and reason): simulated Lower Body Bathing: Min guard, Sit to/from stand Lower Body Bathing Details (indicate cue type and reason): simulated Upper Body Dressing : Minimal assistance, Sitting Upper Body Dressing Details (indicate cue type and reason): simulated Lower Body Dressing: Moderate assistance, Sit to/from stand Toilet Transfer: Min guard, Ambulation Toileting- Clothing Manipulation and Hygiene: Min guard, Sit to/from stand Functional mobility during ADLs: Moderate assistance, Cueing for sequencing, Cueing for safety General ADL Comments: focused on RUE HEP    Mobility  Overal bed mobility: Needs Assistance Bed Mobility: Sit to Supine, Supine to Sit Rolling: Supervision Supine to sit: Min guard, HOB elevated Sit to supine: Min guard, HOB elevated General bed mobility comments: with use of bedrail    Transfers  Overall transfer  level: Needs assistance Equipment used: Rolling walker (2 wheels) Transfers: Sit to/from Stand Sit to Stand: Min guard Bed to/from chair/wheelchair/BSC transfer type:: Stand pivot Stand pivot transfers: Min assist Step pivot transfers: Min guard General transfer comment: declined getting OOB due to condum catheter and rectal pouch    Ambulation / Gait / Stairs / Wheelchair Mobility  Ambulation/Gait Ambulation/Gait assistance: Counsellor (Feet): 20 Feet (x4 trials with seated rest breaks) Assistive device: Rolling walker (2 wheels) Gait Pattern/deviations: Trunk flexed, Decreased stride length General Gait Details: downward gaze, intermittent cueing required for maintenance of R hand on RW, decreased R foot clearance with fatigue Gait velocity: WFL Gait velocity interpretation: <1.31 ft/sec, indicative of household ambulator Pre-gait activities: standing balance ck    Posture / Balance Dynamic Sitting Balance Sitting balance - Comments: did not maintain sitting balance for longer than 30 seconds Balance Overall balance assessment: Needs assistance Sitting-balance support: Feet  supported Sitting balance-Leahy Scale: Good Sitting balance - Comments: did not maintain sitting balance for longer than 30 seconds Standing balance support: Bilateral upper extremity supported Standing balance-Leahy Scale: Fair Standing balance comment: assist for standing balance    Special needs/care consideration Fall precautions Hgb A1c 6.8   Previous Home Environment  Living Arrangements:  (since 12/23 staying with ex wife, [name] since leaving SNF)  Lives With: Other (Comment) Available Help at Discharge: Available 24 hours/day Type of Home: [place]: One level Home Access: Stairs to enter Entrance Stairs-Rails:  (has rail but unsure what side) Entrance Stairs-Number of Steps: 2 Bathroom Shower/Tub: Multimedia programmer: Programmer, systems:  Yes [place]: Yes Type of Home Care Services: Home PT, [place] (if known): [place] Additional 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; retired  Nurse, learning disability for Discharge Living Setting: Lives with (comment) (living with exwife, [name] since d/c from [facility] 12/17/22) Type of Home at Discharge: House Discharge Home Layout: One level Discharge Home Access: Stairs to enter Entrance Stairs-Rails: None Entrance Stairs-Number of Steps: 2 Discharge Bathroom Shower/Tub: Walk-in shower Discharge Bathroom Toilet: Standard Discharge Bathroom Accessibility: Yes How Accessible: Accessible via walker Does the patient have any problems obtaining your medications?: No  Social/Family/Support Systems Patient Roles: Parent Contact Information: son, [name], in [place], main contact Anticipated Caregiver: exwife, Armed forces training and education officer Information: son, [name] , is main contact Ability/Limitations of Caregiver: none Caregiver Availability: 24/7 Discharge Plan Discussed with Primary Caregiver: Yes Is Caregiver In Agreement with Plan?: Yes Does Caregiver/Family have Issues with Lodging/Transportation while Pt is in Rehab?: No  Goals Patient/Family Goal for Rehab: Mod I to supervision with PT, OT and SLP Expected length of stay: [facility] 10 to 14 days Additional Information: Patient was living [place] prior to 9/23 Pt/Family Agrees to Admission and willing to participate: Yes Program Orientation Provided & Reviewed with Pt/Caregiver Including Roles  & Responsibilities: Yes Additional Information Needs: Was at [facility] 9/1 until 12/17/22 and went to stay with exwife Information Needs to be Provided By: [name], aware that if needed SNF would likely be private pay as he has used 70 + days of SNF benefit  Barriers to Discharge: Insurance for SNF coverage  Decrease burden of Care through IP rehab admission: n/a  Possible need for SNF placement upon  discharge: not anticipated  Patient Condition: I have reviewed medical records from [facility], spoken with CSW, and patient and family member. I met with patient at the bedside and discussed via phone for inpatient rehabilitation assessment.  Patient will benefit from ongoing PT, OT, and SLP, can actively participate in 3 hours of therapy a day 5 days of the week, and can make measurable gains during the admission.  Patient will also benefit from the coordinated team approach during an Inpatient Acute Rehabilitation admission.  The patient will receive intensive therapy as well as Rehabilitation physician, nursing, social worker, and care management interventions.  Due to bladder management, bowel management, safety, skin/wound care, disease management, medication administration, pain management, and patient education the patient requires 24 hour a day rehabilitation nursing.  The patient is currently min assist overall with mobility and basic ADLs.  Discharge setting and therapy post discharge at home with home health is anticipated.  Patient has agreed to participate in the Acute Inpatient Rehabilitation Program and will admit today.  Preadmission Screen Completed By:  Cleatrice Burke, 01/06/2023 10:57 AM ______________________________________________________________________   Discussed status with Dr. Tressa Busman on  01/06/23 at 1020 and received approval for admission today.  Admission Coordinator:  Cleatrice Burke, RN, time 1020 Date 01/06/23   Assessment/Plan: Diagnosis: Does the need for close, 24 hr/day Medical supervision in concert with the patient's rehab needs make it unreasonable for this patient to be served in a less intensive setting? Yes Co-Morbidities requiring supervision/potential complications: Encephalopathy due to recent influenza infection causing sepsis, L MCA CVA with R hemiparesis, HTN, uncontrolled hypothyroidism, Hx DVT/PE, diabetes type 2, Esophageal outlet  obstructions s/p PEG, severe protein calorie malnutrition, electrolyte deficiencies Due to bladder management, bowel management, safety, skin/wound care, disease management, medication administration, pain management, and patient education, does the patient require 24 hr/day rehab nursing? Yes Does the patient require coordinated care of a physician, rehab nurse, PT, OT, and SLP to address physical and functional deficits in the context of the above medical diagnosis(es)? Yes Addressing deficits in the following areas: balance, endurance, locomotion, strength, transferring, bowel/bladder control, bathing, dressing, feeding, grooming, toileting, cognition, and psychosocial support Can the patient actively participate in an intensive therapy program of at least 3 hrs of therapy 5 days a week? Yes The potential for patient to make measurable gains while on inpatient rehab is good Anticipated functional outcomes upon discharge from inpatient rehab: supervision PT, supervision OT, supervision SLP Estimated rehab length of stay to reach the above functional goals is: 10-14 days Anticipated discharge destination: Home 10. Overall Rehab/Functional Prognosis: good   MD Signature:   Gertie Gowda, DO 01/06/2023

## 2023-01-06 ENCOUNTER — Inpatient Hospital Stay (HOSPITAL_COMMUNITY): Payer: Medicare Other

## 2023-01-06 ENCOUNTER — Encounter (HOSPITAL_COMMUNITY): Payer: Self-pay | Admitting: Physical Medicine and Rehabilitation

## 2023-01-06 ENCOUNTER — Other Ambulatory Visit: Payer: Self-pay

## 2023-01-06 ENCOUNTER — Telehealth: Payer: Self-pay

## 2023-01-06 ENCOUNTER — Other Ambulatory Visit (HOSPITAL_COMMUNITY): Payer: Self-pay

## 2023-01-06 ENCOUNTER — Inpatient Hospital Stay (HOSPITAL_COMMUNITY)
Admission: RE | Admit: 2023-01-06 | Discharge: 2023-01-15 | DRG: 945 | Disposition: A | Discharge status: Other Acute Inpatient Hospital | Payer: Medicare Other | Source: Intra-hospital | Attending: Physical Medicine and Rehabilitation | Admitting: Physical Medicine and Rehabilitation

## 2023-01-06 DIAGNOSIS — Z515 Encounter for palliative care: Secondary | ICD-10-CM | POA: Diagnosis not present

## 2023-01-06 DIAGNOSIS — E119 Type 2 diabetes mellitus without complications: Secondary | ICD-10-CM | POA: Diagnosis present

## 2023-01-06 DIAGNOSIS — M069 Rheumatoid arthritis, unspecified: Secondary | ICD-10-CM | POA: Diagnosis present

## 2023-01-06 DIAGNOSIS — Z87891 Personal history of nicotine dependence: Secondary | ICD-10-CM

## 2023-01-06 DIAGNOSIS — E785 Hyperlipidemia, unspecified: Secondary | ICD-10-CM | POA: Diagnosis present

## 2023-01-06 DIAGNOSIS — Z789 Other specified health status: Secondary | ICD-10-CM

## 2023-01-06 DIAGNOSIS — Z9221 Personal history of antineoplastic chemotherapy: Secondary | ICD-10-CM

## 2023-01-06 DIAGNOSIS — M1811 Unilateral primary osteoarthritis of first carpometacarpal joint, right hand: Secondary | ICD-10-CM | POA: Diagnosis present

## 2023-01-06 DIAGNOSIS — K921 Melena: Secondary | ICD-10-CM | POA: Diagnosis not present

## 2023-01-06 DIAGNOSIS — R632 Polyphagia: Secondary | ICD-10-CM

## 2023-01-06 DIAGNOSIS — J690 Pneumonitis due to inhalation of food and vomit: Secondary | ICD-10-CM | POA: Diagnosis present

## 2023-01-06 DIAGNOSIS — K224 Dyskinesia of esophagus: Secondary | ICD-10-CM | POA: Diagnosis present

## 2023-01-06 DIAGNOSIS — K920 Hematemesis: Secondary | ICD-10-CM | POA: Diagnosis not present

## 2023-01-06 DIAGNOSIS — Z85819 Personal history of malignant neoplasm of unspecified site of lip, oral cavity, and pharynx: Secondary | ICD-10-CM

## 2023-01-06 DIAGNOSIS — E46 Unspecified protein-calorie malnutrition: Secondary | ICD-10-CM | POA: Diagnosis not present

## 2023-01-06 DIAGNOSIS — Z6820 Body mass index (BMI) 20.0-20.9, adult: Secondary | ICD-10-CM

## 2023-01-06 DIAGNOSIS — R5381 Other malaise: Secondary | ICD-10-CM | POA: Diagnosis present

## 2023-01-06 DIAGNOSIS — J101 Influenza due to other identified influenza virus with other respiratory manifestations: Secondary | ICD-10-CM | POA: Diagnosis present

## 2023-01-06 DIAGNOSIS — Z8711 Personal history of peptic ulcer disease: Secondary | ICD-10-CM

## 2023-01-06 DIAGNOSIS — I10 Essential (primary) hypertension: Secondary | ICD-10-CM | POA: Diagnosis present

## 2023-01-06 DIAGNOSIS — Z539 Procedure and treatment not carried out, unspecified reason: Secondary | ICD-10-CM | POA: Diagnosis not present

## 2023-01-06 DIAGNOSIS — I6320 Cerebral infarction due to unspecified occlusion or stenosis of unspecified precerebral arteries: Secondary | ICD-10-CM | POA: Diagnosis not present

## 2023-01-06 DIAGNOSIS — C029 Malignant neoplasm of tongue, unspecified: Secondary | ICD-10-CM | POA: Diagnosis present

## 2023-01-06 DIAGNOSIS — D696 Thrombocytopenia, unspecified: Secondary | ICD-10-CM | POA: Diagnosis present

## 2023-01-06 DIAGNOSIS — Z931 Gastrostomy status: Secondary | ICD-10-CM

## 2023-01-06 DIAGNOSIS — N179 Acute kidney failure, unspecified: Secondary | ICD-10-CM | POA: Diagnosis present

## 2023-01-06 DIAGNOSIS — Z809 Family history of malignant neoplasm, unspecified: Secondary | ICD-10-CM

## 2023-01-06 DIAGNOSIS — E872 Acidosis, unspecified: Secondary | ICD-10-CM | POA: Diagnosis present

## 2023-01-06 DIAGNOSIS — Z923 Personal history of irradiation: Secondary | ICD-10-CM | POA: Diagnosis not present

## 2023-01-06 DIAGNOSIS — I63512 Cerebral infarction due to unspecified occlusion or stenosis of left middle cerebral artery: Secondary | ICD-10-CM | POA: Diagnosis not present

## 2023-01-06 DIAGNOSIS — R32 Unspecified urinary incontinence: Secondary | ICD-10-CM

## 2023-01-06 DIAGNOSIS — I69320 Aphasia following cerebral infarction: Secondary | ICD-10-CM

## 2023-01-06 DIAGNOSIS — J9601 Acute respiratory failure with hypoxia: Secondary | ICD-10-CM | POA: Diagnosis not present

## 2023-01-06 DIAGNOSIS — D62 Acute posthemorrhagic anemia: Secondary | ICD-10-CM | POA: Diagnosis present

## 2023-01-06 DIAGNOSIS — Z7902 Long term (current) use of antithrombotics/antiplatelets: Secondary | ICD-10-CM

## 2023-01-06 DIAGNOSIS — K222 Esophageal obstruction: Secondary | ICD-10-CM | POA: Diagnosis present

## 2023-01-06 DIAGNOSIS — E612 Magnesium deficiency: Secondary | ICD-10-CM | POA: Diagnosis present

## 2023-01-06 DIAGNOSIS — I639 Cerebral infarction, unspecified: Secondary | ICD-10-CM | POA: Diagnosis not present

## 2023-01-06 DIAGNOSIS — J111 Influenza due to unidentified influenza virus with other respiratory manifestations: Secondary | ICD-10-CM

## 2023-01-06 DIAGNOSIS — Z9911 Dependence on respirator [ventilator] status: Secondary | ICD-10-CM | POA: Diagnosis not present

## 2023-01-06 DIAGNOSIS — Z9862 Peripheral vascular angioplasty status: Secondary | ICD-10-CM | POA: Diagnosis not present

## 2023-01-06 DIAGNOSIS — R159 Full incontinence of feces: Secondary | ICD-10-CM

## 2023-01-06 DIAGNOSIS — R4189 Other symptoms and signs involving cognitive functions and awareness: Secondary | ICD-10-CM

## 2023-01-06 DIAGNOSIS — J849 Interstitial pulmonary disease, unspecified: Secondary | ICD-10-CM | POA: Diagnosis present

## 2023-01-06 DIAGNOSIS — Z86711 Personal history of pulmonary embolism: Secondary | ICD-10-CM

## 2023-01-06 DIAGNOSIS — K2081 Other esophagitis with bleeding: Secondary | ICD-10-CM | POA: Diagnosis not present

## 2023-01-06 DIAGNOSIS — Z86718 Personal history of other venous thrombosis and embolism: Secondary | ICD-10-CM

## 2023-01-06 DIAGNOSIS — Z8249 Family history of ischemic heart disease and other diseases of the circulatory system: Secondary | ICD-10-CM

## 2023-01-06 DIAGNOSIS — Z7189 Other specified counseling: Secondary | ICD-10-CM | POA: Diagnosis not present

## 2023-01-06 DIAGNOSIS — K922 Gastrointestinal hemorrhage, unspecified: Secondary | ICD-10-CM | POA: Diagnosis not present

## 2023-01-06 DIAGNOSIS — E43 Unspecified severe protein-calorie malnutrition: Secondary | ICD-10-CM | POA: Diagnosis present

## 2023-01-06 DIAGNOSIS — Z7901 Long term (current) use of anticoagulants: Secondary | ICD-10-CM | POA: Diagnosis not present

## 2023-01-06 DIAGNOSIS — K264 Chronic or unspecified duodenal ulcer with hemorrhage: Secondary | ICD-10-CM | POA: Diagnosis not present

## 2023-01-06 DIAGNOSIS — R1314 Dysphagia, pharyngoesophageal phase: Secondary | ICD-10-CM | POA: Diagnosis present

## 2023-01-06 DIAGNOSIS — M24542 Contracture, left hand: Secondary | ICD-10-CM | POA: Diagnosis present

## 2023-01-06 DIAGNOSIS — I951 Orthostatic hypotension: Secondary | ICD-10-CM | POA: Diagnosis not present

## 2023-01-06 DIAGNOSIS — R6889 Other general symptoms and signs: Secondary | ICD-10-CM | POA: Insufficient documentation

## 2023-01-06 LAB — GLUCOSE, CAPILLARY
Glucose-Capillary: 169 mg/dL — ABNORMAL HIGH (ref 70–99)
Glucose-Capillary: 225 mg/dL — ABNORMAL HIGH (ref 70–99)
Glucose-Capillary: 116 mg/dL — ABNORMAL HIGH (ref 70–99)
Glucose-Capillary: 124 mg/dL — ABNORMAL HIGH (ref 70–99)

## 2023-01-06 LAB — PHOSPHORUS: Phosphorus: 2.8 mg/dL (ref 2.5–4.6)

## 2023-01-06 MED ORDER — LEVOTHYROXINE SODIUM 50 MCG PO TABS
50.0000 ug | ORAL_TABLET | Freq: Every day | ORAL | Status: DC
  Administered 2023-01-07 – 2023-01-15 (×9): 50 ug
  Filled 2023-01-06 (×9): qty 1

## 2023-01-06 MED ORDER — PREDNISONE 10 MG PO TABS
ORAL_TABLET | ORAL | 0 refills | Status: DC
  Filled 2023-01-06: qty 18, 30d supply, fill #0

## 2023-01-06 MED ORDER — TICAGRELOR 90 MG PO TABS
90.0000 mg | ORAL_TABLET | Freq: Two times a day (BID) | ORAL | Status: DC
  Administered 2023-01-06 – 2023-01-14 (×17): 90 mg
  Filled 2023-01-06 (×17): qty 1

## 2023-01-06 MED ORDER — PROCHLORPERAZINE MALEATE 5 MG PO TABS: 5.0000 mg | ORAL_TABLET | Freq: Four times a day (QID) | ORAL | Status: DC | PRN

## 2023-01-06 MED ORDER — ORAL CARE MOUTH RINSE: 15.0000 mL | OROMUCOSAL | Status: DC | PRN

## 2023-01-06 MED ORDER — ASPIRIN 81 MG PO CHEW
81.0000 mg | CHEWABLE_TABLET | Freq: Every day | ORAL | 0 refills | Status: DC
  Filled 2023-01-06: qty 30, 30d supply, fill #0

## 2023-01-06 MED ORDER — ACETAMINOPHEN 325 MG PO TABS
325.0000 mg | ORAL_TABLET | ORAL | Status: DC | PRN
  Administered 2023-01-11: 325 mg via ORAL
  Administered 2023-01-11: 650 mg via ORAL
  Filled 2023-01-06 (×4): qty 2

## 2023-01-06 MED ORDER — PANTOPRAZOLE SODIUM 40 MG IV SOLR: 40.0000 mg | Freq: Every day | INTRAVENOUS | Status: DC

## 2023-01-06 MED ORDER — PROSOURCE TF20 ENFIT COMPATIBL EN LIQD
60.0000 mL | Freq: Every day | ENTERAL | Status: DC
  Administered 2023-01-07: 60 mL
  Filled 2023-01-06: qty 60

## 2023-01-06 MED ORDER — POLYETHYLENE GLYCOL 3350 17 G PO PACK
17.0000 g | PACK | Freq: Every day | ORAL | Status: DC | PRN
  Administered 2023-01-11: 17 g via ORAL
  Filled 2023-01-06: qty 1

## 2023-01-06 MED ORDER — FREE WATER: 150.0000 mL | 0 refills | Status: DC

## 2023-01-06 MED ORDER — PROSOURCE TF20 ENFIT COMPATIBL EN LIQD: 60.0000 mL | Freq: Every day | ENTERAL | 10 refills | Status: DC

## 2023-01-06 MED ORDER — PROCHLORPERAZINE 25 MG RE SUPP: 12.5000 mg | Freq: Four times a day (QID) | RECTAL | Status: DC | PRN

## 2023-01-06 MED ORDER — FLEET ENEMA 7-19 GM/118ML RE ENEM: 1.0000 | ENEMA | Freq: Once | RECTAL | Status: DC | PRN

## 2023-01-06 MED ORDER — OSMOLITE 1.5 CAL PO LIQD: 1000.0000 mL | ORAL | Status: DC

## 2023-01-06 MED ORDER — PROCHLORPERAZINE EDISYLATE 10 MG/2ML IJ SOLN: 5.0000 mg | Freq: Four times a day (QID) | INTRAMUSCULAR | Status: DC | PRN

## 2023-01-06 MED ORDER — ORAL CARE MOUTH RINSE
15.0000 mL | OROMUCOSAL | Status: DC
  Administered 2023-01-07 – 2023-01-15 (×26): 15 mL via OROMUCOSAL

## 2023-01-06 MED ORDER — INSULIN ASPART 100 UNIT/ML IJ SOLN
0.0000 [IU] | INTRAMUSCULAR | Status: DC
  Administered 2023-01-07: 2 [IU] via SUBCUTANEOUS
  Administered 2023-01-08: 1 [IU] via SUBCUTANEOUS
  Administered 2023-01-08 (×3): 2 [IU] via SUBCUTANEOUS
  Administered 2023-01-09 (×2): 1 [IU] via SUBCUTANEOUS
  Administered 2023-01-09: 2 [IU] via SUBCUTANEOUS
  Administered 2023-01-09: 1 [IU] via SUBCUTANEOUS
  Administered 2023-01-09 – 2023-01-10 (×3): 2 [IU] via SUBCUTANEOUS
  Administered 2023-01-10: 1 [IU] via SUBCUTANEOUS
  Administered 2023-01-10 (×2): 2 [IU] via SUBCUTANEOUS
  Administered 2023-01-10: 3 [IU] via SUBCUTANEOUS
  Administered 2023-01-11: 2 [IU] via SUBCUTANEOUS
  Administered 2023-01-11: 1 [IU] via SUBCUTANEOUS
  Administered 2023-01-11: 3 [IU] via SUBCUTANEOUS
  Administered 2023-01-11: 2 [IU] via SUBCUTANEOUS
  Administered 2023-01-11: 1 [IU] via SUBCUTANEOUS
  Administered 2023-01-12: 2 [IU] via SUBCUTANEOUS
  Administered 2023-01-12: 1 [IU] via SUBCUTANEOUS
  Administered 2023-01-12: 2 [IU] via SUBCUTANEOUS
  Administered 2023-01-12 (×2): 1 [IU] via SUBCUTANEOUS
  Administered 2023-01-12 – 2023-01-13 (×3): 2 [IU] via SUBCUTANEOUS
  Administered 2023-01-13 (×2): 1 [IU] via SUBCUTANEOUS
  Administered 2023-01-13: 2 [IU] via SUBCUTANEOUS
  Administered 2023-01-14: 1 [IU] via SUBCUTANEOUS
  Administered 2023-01-14 (×2): 2 [IU] via SUBCUTANEOUS
  Administered 2023-01-14: 1 [IU] via SUBCUTANEOUS
  Administered 2023-01-14 – 2023-01-15 (×3): 2 [IU] via SUBCUTANEOUS
  Administered 2023-01-15 (×2): 1 [IU] via SUBCUTANEOUS
  Administered 2023-01-15: 2 [IU] via SUBCUTANEOUS

## 2023-01-06 MED ORDER — TRAZODONE HCL 50 MG PO TABS
25.0000 mg | ORAL_TABLET | Freq: Every evening | ORAL | Status: DC | PRN
  Filled 2023-01-06: qty 1

## 2023-01-06 MED ORDER — GUAIFENESIN-DM 100-10 MG/5ML PO SYRP: 5.0000 mL | ORAL_SOLUTION | Freq: Four times a day (QID) | ORAL | Status: DC | PRN

## 2023-01-06 MED ORDER — ASPIRIN 81 MG PO CHEW
81.0000 mg | CHEWABLE_TABLET | Freq: Every day | ORAL | Status: DC
  Administered 2023-01-07 – 2023-01-14 (×8): 81 mg
  Filled 2023-01-06 (×8): qty 1

## 2023-01-06 MED ORDER — ATORVASTATIN CALCIUM 40 MG PO TABS
40.0000 mg | ORAL_TABLET | Freq: Every day | ORAL | Status: DC
  Administered 2023-01-07 – 2023-01-15 (×9): 40 mg
  Filled 2023-01-06 (×9): qty 1

## 2023-01-06 MED ORDER — DIPHENHYDRAMINE HCL 12.5 MG/5ML PO ELIX: 12.5000 mg | ORAL_SOLUTION | Freq: Four times a day (QID) | ORAL | Status: DC | PRN

## 2023-01-06 MED ORDER — TICAGRELOR 90 MG PO TABS
90.0000 mg | ORAL_TABLET | Freq: Two times a day (BID) | ORAL | 0 refills | Status: DC
  Filled 2023-01-06: qty 60, 30d supply, fill #0

## 2023-01-06 MED ORDER — PREDNISONE 5 MG PO TABS
10.0000 mg | ORAL_TABLET | Freq: Every day | ORAL | Status: AC
  Administered 2023-01-07: 10 mg
  Filled 2023-01-06: qty 2

## 2023-01-06 MED ORDER — FREE WATER
150.0000 mL | Status: DC
  Administered 2023-01-06 – 2023-01-10 (×24): 150 mL

## 2023-01-06 MED ORDER — THIAMINE MONONITRATE 100 MG PO TABS
100.0000 mg | ORAL_TABLET | Freq: Every day | ORAL | Status: AC
  Administered 2023-01-07 – 2023-01-08 (×2): 100 mg
  Filled 2023-01-06 (×2): qty 1

## 2023-01-06 MED ORDER — BISACODYL 10 MG RE SUPP: 10.0000 mg | Freq: Every day | RECTAL | Status: DC | PRN

## 2023-01-06 MED ORDER — OSMOLITE 1.5 CAL PO LIQD: 1000.0000 mL | ORAL | 0 refills | Status: DC

## 2023-01-06 MED ORDER — ORAL CARE MOUTH RINSE
15.0000 mL | OROMUCOSAL | Status: DC
  Administered 2023-01-07 – 2023-01-10 (×13): 15 mL via OROMUCOSAL

## 2023-01-06 MED ORDER — ALUM & MAG HYDROXIDE-SIMETH 200-200-20 MG/5ML PO SUSP: 30.0000 mL | ORAL | Status: DC | PRN

## 2023-01-06 MED ORDER — PREDNISONE 1 MG PO TABS
1.0000 mg | ORAL_TABLET | Freq: Every day | ORAL | Status: DC
  Administered 2023-01-08 – 2023-01-15 (×8): 1 mg
  Filled 2023-01-06 (×8): qty 1

## 2023-01-06 NOTE — Progress Notes (Signed)
Gertie Gowda, DO  Physician Physical Medicine and Rehabilitation   PMR Pre-admission    Signed   Date of Service: 01/05/2023  5:39 PM  Related encounter: ED to Hosp-Admission (Discharged) from 12/27/2022 in [facility]   Signed      Show:Clear all '[x]'$ Written'[x]'$ Templated'[x]'$ Copied  Added by: '[x]'$ Julious Payer, Vertis Kelch, RN'[x]'$ Gertie Gowda, DO  '[]'$ Hover for details PMR Admission Coordinator Pre-Admission Assessment   Patient: [name] is an 77 y.o., male MRN: [id number] DOB: May 05, 1946 Height: '6\' 1"'$  (185.4 cm) Weight: 75.7 kg   Insurance Information HMO:     PPO:      PCP:      IPA:      80/20:      OTHER:  PRIMARY: Medicare a and b      Policy#: [id number]      Subscriber: pt Benefits:  Phone #: passport one source     Name: 1/10 Eff. Date: 09/26/2010     Deduct: $1632      Out of Pocket Max: none CIR: 100%  Has used approximately 50 hospital days since 08/27/22    SNF: Has used approximately 68 SNF days since 08/27/22 ( [facility]) Outpatient: 80%     Co-Pay: 20% Home Health: 100%      Co-Pay: none DME: 80%     Co-Pay: 20% Providers: in network  SECONDARY: BCBS supplement      Policy#: [id number]   Financial Counselor:       Phone#:    The "Data Collection Information Summary" for patients in Inpatient Rehabilitation Facilities with attached "Privacy Act [place] Records" was provided and verbally reviewed with: Patient and Family   Emergency Contact Information Contact Information       Name Relation Home Work Mobile    [place] Son     [phone]    Theseus, Birnie     [phone]         Current Medical History  Patient Admitting Diagnosis: CVA   History of Present Illness: 77 year old male with history of aortic aneurysm, DM, dyslipidemia, GERD, Hiatal hernia, HTN, Rheumatoid arthritis on immunosuppression with Orencia and methotrexate as well as prednisone, 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 On arrival noted fever 101.6. Respiratory panel positive for influenza A.    Neurology consulted. 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 CVA felt to atheroembolic in the setting of his left carotid ulcerated plaque as well as severe left cavernous segment stenosis. Recommend dual antiplatelet therapy with Plavix and ASA for 3 months followed by ASA alone. Lovenox for DVT prophylaxis. Home med of atorvastatin resumed. Hgb A1c 6.8 with home med metformin resumed. Prednisone tapered. Resumed PEG feeds per Gtube. Recent history of PNA, influenza a. Continue IS/flutter valve.Resumed home levothyroxine.    Was to be discharged 1/5 but became hypotensive with neurologic symptoms quickly resolving after fluid bolus. Therefore previous carotid stent that was to be done as outpatient was sought. Loading dose of Ticagrelor begun.Underwent Left carotid stent on 1/8.    Complete NIHSS TOTAL: 0   Patient's medical record from [facility] has been reviewed by the rehabilitation admission coordinator and physician.   Past Medical History      Past Medical History:  Diagnosis Date   Aortic aneurysm ([place]) 05/2017   Chronic back pain  Diabetes mellitus without complication (HCC)     Dyslipidemia     GERD (gastroesophageal reflux disease)     H/O hiatal hernia     Hypertension     Melanoma ([place])      "back; left arm"   PONV (postoperative nausea and vomiting)     Rheumatoid arthritis ([place])     Spasm of esophagus      Has the patient had major surgery during 100 days prior to admission? Yes   Family History   family history includes Cancer in his maternal grandmother; Heart disease in his father and mother.   Current Medications   Current Facility-Administered Medications:    acetaminophen (TYLENOL) tablet 650 mg, 650 mg, Oral, Q4H PRN **OR**  acetaminophen (TYLENOL) 160 MG/5ML solution 650 mg, 650 mg, Per Tube, Q4H PRN, 650 mg at 01/04/23 0802 **OR** acetaminophen (TYLENOL) suppository 650 mg, 650 mg, Rectal, Q4H PRN, de Sindy Messing, [place], MD   aspirin chewable tablet 81 mg, 81 mg, Per Tube, Daily, Angelique Blonder, DO, 81 mg at 01/06/23 0917   atorvastatin (LIPITOR) tablet 40 mg, 40 mg, Per Tube, Daily, Mapp, Tavien, MD, 40 mg at 01/06/23 0917   Chlorhexidine Gluconate Cloth 2 % PADS 6 each, 6 each, Topical, Daily, Lottie Mussel, MD, 6 each at 01/06/23 0918   feeding supplement (OSMOLITE 1.5 CAL) liquid 1,000 mL, 1,000 mL, Per Tube, Q24H, Delene Ruffini, MD, Infusion Verify at 01/06/23 0400   feeding supplement (PROSource TF20) liquid 60 mL, 60 mL, Per Tube, Daily, Iona Beard, MD, 60 mL at 01/06/23 0917   free water 150 mL, 150 mL, Per Tube, Q4H, G[place]aluck, Rachell Cipro, MD, 150 mL at 01/06/23 0917   insulin aspart (novoLOG) injection 0-5 Units, 0-5 Units, Subcutaneous, QHS, Patel, Amar, DO, 3 Units at 01/04/23 2117   insulin aspart (novoLOG) injection 0-9 Units, 0-9 Units, Subcutaneous, TID WC, Patel, Amar, DO, 3 Units at 01/06/23 1062   levothyroxine (SYNTHROID) tablet 50 mcg, 50 mcg, Per Tube, Q0600, Mapp, Tavien, MD, 50 mcg at 01/06/23 0528   Oral care mouth rinse, 15 mL, Mouth Rinse, 4 times per day, Velna Ochs, MD, 15 mL at 01/05/23 2238   Oral care mouth rinse, 15 mL, Mouth Rinse, PRN, Velna Ochs, MD, 15 mL at 01/01/23 1139   predniSONE (DELTASONE) tablet 10 mg, 10 mg, Per Tube, Q breakfast, Iona Beard, MD, 10 mg at 01/06/23 6948   thiamine (VITAMIN B1) tablet 100 mg, 100 mg, Per Tube, Daily, Lottie Mussel, MD, 100 mg at 01/06/23 5462   [COMPLETED] ticagrelor (BRILINTA) tablet 180 mg, 180 mg, Oral, Once, 180 mg at 01/01/23 0838 **FOLLOWED BY** ticagrelor (BRILINTA) tablet 90 mg, 90 mg, Per Tube, BID, Lottie Mussel, MD, 90 mg at 01/06/23 0916   Patients Current Diet:  Diet Order                  Diet  NPO time specified  Diet effective now             Diet - low sodium heart healthy                       Precautions / Restrictions Precautions Precautions: Fall Precaution Comments: peg tube, flu +, rectal pouch Restrictions Weight Bearing Restrictions: No    Has the patient had 2 or more falls or a fall with injury in the past year? Yes   Prior Activity Level Limited Community (1-2x/wk): supervision to min assist since 12/23. Was Independent prior  to 9/23   Prior Functional Level Self Care: Did the patient need help bathing, dressing, using the toilet or eating? Needed some help   Indoor Mobility: Did the patient need assistance with walking from room to room (with or without device)? Needed some help   Stairs: Did the patient need assistance with internal or external stairs (with or without device)? Needed some help   Functional Cognition: Did the patient need help planning regular tasks such as shopping or remembering to take medications? Needed some help   Patient Information Are you of Hispanic, Latino/a,or Spanish origin?: A. No, not of Hispanic, Latino/a, or Spanish origin What is your race?: A. White Do you need or want an interpreter to communicate with a doctor or health care staff?: 0. No   Patient's Response To:  Health Literacy and Transportation Is the patient able to respond to health literacy and transportation needs?: Yes Health Literacy - How often do you need to have someone help you when you read instructions, pamphlets, or other written material from your doctor or pharmacy?: Sometimes In the past 12 months, has lack of transportation kept you from medical appointments or from getting medications?: No In the past 12 months, has lack of transportation kept you from meetings, work, or from getting things needed for daily living?: No   Development worker, international aid / [employer] Devices/Equipment: None Home Equipment: Shower seat, Hand held shower  head, Rollator (4 wheels)   Prior Device Use: Indicate devices/aids used by the patient prior to current illness, exacerbation or injury? Walker   Current Functional Level Cognition   Arousal/Alertness: Awake/alert Overall Cognitive Status: Within Functional Limits for tasks assessed Difficult to assess due to: Impaired communication Orientation Level: Oriented X4 General Comments: becoming pleasant throughout session Attention: Sustained Sustained Attention: Impaired Sustained Attention Impairment: Verbal basic Memory: Impaired Memory Impairment: Storage deficit, Retrieval deficit, Decreased recall of new information Awareness: Impaired Awareness Impairment: Emergent impairment, Anticipatory impairment Problem Solving: Impaired Problem Solving Impairment: Verbal complex Safety/Judgment: Impaired    Extremity Assessment (includes Sensation/Coordination)   Upper Extremity Assessment: RUE deficits/detail RUE Deficits / Details: pt maintains hand and digit flexion with very limited strength into extension RUE Sensation: WNL RUE Coordination: decreased fine motor  Lower Extremity Assessment: Defer to PT evaluation RLE Deficits / Details: R hio has incoordination issue RLE Coordination: decreased gross motor     ADLs   Overall ADL's : Needs assistance/impaired Eating/Feeding: Set up, Sitting Grooming: Wash/dry hands, Wash/dry face, Moderate assistance Grooming Details (indicate cue type and reason): simulated Upper Body Bathing: Minimal assistance, Sitting Upper Body Bathing Details (indicate cue type and reason): simulated Lower Body Bathing: Min guard, Sit to/from stand Lower Body Bathing Details (indicate cue type and reason): simulated Upper Body Dressing : Minimal assistance, Sitting Upper Body Dressing Details (indicate cue type and reason): simulated Lower Body Dressing: Moderate assistance, Sit to/from stand Toilet Transfer: Min guard, Ambulation Toileting- Clothing  Manipulation and Hygiene: Min guard, Sit to/from stand Functional mobility during ADLs: Moderate assistance, Cueing for sequencing, Cueing for safety General ADL Comments: focused on RUE HEP     Mobility   Overal bed mobility: Needs Assistance Bed Mobility: Sit to Supine, Supine to Sit Rolling: Supervision Supine to sit: Min guard, HOB elevated Sit to supine: Min guard, HOB elevated General bed mobility comments: with use of bedrail     Transfers   Overall transfer level: Needs assistance Equipment used: Rolling walker (2 wheels) Transfers: Sit to/from Stand Sit to Stand: [employer]  guard Bed to/from chair/wheelchair/BSC transfer type:: Stand pivot Stand pivot transfers: Min assist Step pivot transfers: Min guard General transfer comment: declined getting OOB due to condum catheter and rectal pouch     Ambulation / Gait / Stairs / Wheelchair Mobility   Ambulation/Gait Ambulation/Gait assistance: Counsellor (Feet): 20 Feet (x4 trials with seated rest breaks) Assistive device: Rolling walker (2 wheels) Gait Pattern/deviations: Trunk flexed, Decreased stride length General Gait Details: downward gaze, intermittent cueing required for maintenance of R hand on RW, decreased R foot clearance with fatigue Gait velocity: WFL Gait velocity interpretation: <1.31 ft/sec, indicative of household ambulator Pre-gait activities: standing balance ck     Posture / Balance Dynamic Sitting Balance Sitting balance - Comments: did not maintain sitting balance for longer than 30 seconds Balance Overall balance assessment: Needs assistance Sitting-balance support: Feet supported Sitting balance-Leahy Scale: Good Sitting balance - Comments: did not maintain sitting balance for longer than 30 seconds Standing balance support: Bilateral upper extremity supported Standing balance-Leahy Scale: Fair Standing balance comment: assist for standing balance     Special needs/care consideration Fall  precautions Hgb A1c 6.8    Previous Home Environment  Living Arrangements:  (since 12/23 staying with ex wife, [name] since leaving SNF)  Lives With: Other (Comment) Available Help at Discharge: Available 24 hours/day Type of Home: [place]: One level Home Access: Stairs to enter Entrance Stairs-Rails:  (has rail but unsure what side) Entrance Stairs-Number of Steps: 2 Bathroom Shower/Tub: Multimedia programmer: Programmer, systems: Yes [place]: Yes Type of Home Care Services: Home PT, [place] (if known): [place] Additional 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; retired   Nurse, learning disability for Discharge Living Setting: Lives with (comment) (living with exwife, [name] since d/c from [facility] 12/17/22) Type of Home at Discharge: House Discharge Home Layout: One level Discharge Home Access: Stairs to enter Entrance Stairs-Rails: None Entrance Stairs-Number of Steps: 2 Discharge Bathroom Shower/Tub: Walk-in shower Discharge Bathroom Toilet: Standard Discharge Bathroom Accessibility: Yes How Accessible: Accessible via walker Does the patient have any problems obtaining your medications?: No   Social/Family/Support Systems Patient Roles: Parent Contact Information: son, [name], in [place], main contact Anticipated Caregiver: exwife, Armed forces training and education officer Information: son, [name] , is main contact Ability/Limitations of Caregiver: none Caregiver Availability: 24/7 Discharge Plan Discussed with Primary Caregiver: Yes Is Caregiver In Agreement with Plan?: Yes Does Caregiver/Family have Issues with Lodging/Transportation while Pt is in Rehab?: No   Goals Patient/Family Goal for Rehab: Mod I to supervision with PT, OT and SLP Expected length of stay: ELOS 10 to 14 days Additional Information: Patient was living [place] prior to 9/23 Pt/Family Agrees to Admission and willing to participate: Yes Program  Orientation Provided & Reviewed with Pt/Caregiver Including Roles  & Responsibilities: Yes Additional Information Needs: Was at [facility] 9/1 until 12/17/22 and went to stay with exwife Information Needs to be Provided By: [name], aware that if needed SNF would likely be private pay as he has used 70 + days of SNF benefit  Barriers to Discharge: Insurance for SNF coverage   Decrease burden of Care through IP rehab admission: n/a   Possible need for SNF placement upon discharge: not anticipated   Patient Condition: I have reviewed medical records from [facility], spoken with CSW, and patient and family member. I met with patient at the bedside and discussed via phone for inpatient rehabilitation assessment.  Patient will benefit from ongoing PT,  OT, and SLP, can actively participate in 3 hours of therapy a day 5 days of the week, and can make measurable gains during the admission.  Patient will also benefit from the coordinated team approach during an Inpatient Acute Rehabilitation admission.  The patient will receive intensive therapy as well as Rehabilitation physician, nursing, social worker, and care management interventions.  Due to bladder management, bowel management, safety, skin/wound care, disease management, medication administration, pain management, and patient education the patient requires 24 hour a day rehabilitation nursing.  The patient is currently min assist overall with mobility and basic ADLs.  Discharge setting and therapy post discharge at home with home health is anticipated.  Patient has agreed to participate in the Acute Inpatient Rehabilitation Program and will admit today.   Preadmission Screen Completed By:  Cleatrice Burke, 01/06/2023 10:57 AM ______________________________________________________________________   Discussed status with Dr. Tressa Busman on 01/06/23 at 104 and received approval for admission today.   Admission Coordinator:  Cleatrice Burke, RN, time 1020 Date 01/06/23    Assessment/Plan: Diagnosis: Does the need for close, 24 hr/day Medical supervision in concert with the patient's rehab needs make it unreasonable for this patient to be served in a less intensive setting? Yes Co-Morbidities requiring supervision/potential complications: Encephalopathy due to recent influenza infection causing sepsis, L MCA CVA with R hemiparesis, HTN, uncontrolled hypothyroidism, Hx DVT/PE, diabetes type 2, Esophageal outlet obstructions s/p PEG, severe protein calorie malnutrition, electrolyte deficiencies Due to bladder management, bowel management, safety, skin/wound care, disease management, medication administration, pain management, and patient education, does the patient require 24 hr/day rehab nursing? Yes Does the patient require coordinated care of a physician, rehab nurse, PT, OT, and SLP to address physical and functional deficits in the context of the above medical diagnosis(es)? Yes Addressing deficits in the following areas: balance, endurance, locomotion, strength, transferring, bowel/bladder control, bathing, dressing, feeding, grooming, toileting, cognition, and psychosocial support Can the patient actively participate in an intensive therapy program of at least 3 hrs of therapy 5 days a week? Yes The potential for patient to make measurable gains while on inpatient rehab is good Anticipated functional outcomes upon discharge from inpatient rehab: supervision PT, supervision OT, supervision SLP Estimated rehab length of stay to reach the above functional goals is: 10-14 days Anticipated discharge destination: Home 10. Overall Rehab/Functional Prognosis: good     MD Signature:     Gertie Gowda, DO 01/06/2023            Revision History

## 2023-01-06 NOTE — TOC Transition Note (Signed)
Transition of Care [facility]) - CM/SW Discharge Note   Patient Details  Name: [name] MRN: [id number] Date of Birth: 01/02/1946  Transition of Care [facility]) CM/SW Contact:  Verdell Carmine, RN Phone Number: 01/06/2023, 9:48 AM   Clinical Narrative:     Patient has been accepted to CIR and they will be taking him today.  TOC signing off, if there are needs identified, please place TOC conult.   Final next level of care: IP Rehab Facility Barriers to Discharge: No Barriers Identified   Patient Goals and CMS Choice CMS Medicare.gov Compare Post Acute Care list provided to:: Patient Choice offered to / list presented to : Patient  Discharge Placement    CIR                     Discharge Plan and Services Additional resources added to the After Visit Summary for     Discharge Planning Services: CM Consult Post Acute Care Choice: Home Health                               Social Determinants of Health (SDOH) Interventions SDOH Screenings   Food Insecurity: No Food Insecurity (12/28/2022)  Housing: Low Risk  (12/28/2022)  Transportation Needs: No Transportation Needs (12/28/2022)  Utilities: Not At Risk (12/28/2022)  Tobacco Use: Medium Risk (01/05/2023)     Readmission Risk Interventions     No data to display

## 2023-01-06 NOTE — Progress Notes (Signed)
Report called to [place]. Patient for rehab. Will remove IV's prior to transfer. Patient is on room air.

## 2023-01-06 NOTE — Telephone Encounter (Addendum)
Incoming fax from the pharmacy:Protein feeding supplement prosource TF20, liquid Message: "We can not order this item at all. Also Rx is for 1 dose only? I day supply + 10 refills? Maybe try a med supply store"   Nutritional supplements feeding supplement, osmolite 1.5 cal,liquid Message: Not covered by insurance. Also qty [place] even be enough for 1 dose. Maybe try a med supply store.

## 2023-01-06 NOTE — Progress Notes (Signed)
Patient transferred to rehab.

## 2023-01-06 NOTE — Progress Notes (Signed)
Physical Therapy Treatment Patient Details Name: [name] MRN: [id number] DOB: Jul 12, 1946 Today's Date: 01/06/2023   History of Present Illness 77 y.o. male admitted 1/1 after found on floor.  BSW:[id number] foci of restricted diffusion left frontal lobe and left parietal lobe.  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  Pt (+) flu A and possible aspiration PNA. 1/5 code stroke with increased Rt weakness. 1/8 carotid stent in IR. 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 Pt limited by endurance during today's session, requiring supine rest breaks between ambulation trials due to fatigue. Pt reporting dizziness after first ambulation trial, however no drop in BP noted, 115/57(75) mmHg. Pt recovers well with rest breaks and able to ambulate with CGA and RW 20 feet x2 reps, as well as 3 sit<>stand trials. Pt will continue to benefit from skilled acute PT at this time to progress mobility and endurance, discharge recommendations remain appropriate.     Recommendations for follow up therapy are one component of a multi-disciplinary discharge planning process, led by the attending physician.  Recommendations may be updated based on patient status, additional functional criteria and insurance authorization.  Follow Up Recommendations  Acute inpatient rehab (3hours/day)     Assistance Recommended at Discharge Frequent or constant Supervision/Assistance  Patient can return home with the following A little help with walking and/or transfers;A lot of help with bathing/dressing/bathroom;Assistance with cooking/housework;Direct supervision/assist for medications management;Assist for transportation;Assistance with feeding    Equipment Recommendations  Rolling walker (2 wheels)    Recommendations for Other Services       Precautions / Restrictions Precautions Precautions: Fall Precaution Comments: peg tube, flu +, rectal pouch Restrictions Weight Bearing Restrictions: No     Mobility  Bed Mobility Overal bed mobility: Modified Independent Bed Mobility: Supine to Sit, Sit to Supine     Supine to sit: Modified independent (Device/Increase time), HOB elevated Sit to supine: Modified independent (Device/Increase time), HOB elevated   General bed mobility comments: use of bedrails    Transfers Overall transfer level: Needs assistance Equipment used: Rolling walker (2 wheels) Transfers: Sit to/from Stand Sit to Stand: Min assist           General transfer comment: minA progressing to CGA required with initial sit<>stand from non-elevated surface, for balance and steady, mild cueing for proper sequencing    Ambulation/Gait Ambulation/Gait assistance: Min guard Gait Distance (Feet): 20 Feet (x2 trials with supine rest breaks between trials due to fatigue) Assistive device: Rolling walker (2 wheels) Gait Pattern/deviations: Step-through pattern, Decreased stride length, Trunk flexed, Drifts right/left Gait velocity: decreased     General Gait Details: difficulty maintaining RUE on RW, decreased B foot clearance but able to correct with cues   Stairs             Wheelchair Mobility    Modified Rankin (Stroke Patients Only)       Balance Overall balance assessment: Needs assistance Sitting-balance support: Feet supported, No upper extremity supported Sitting balance-Leahy Scale: Good Sitting balance - Comments: no sway noted with static sitting, min guard for dynamic sitting for safety   Standing balance support: Bilateral upper extremity supported Standing balance-Leahy Scale: Fair Standing balance comment: use of BUE on RW for balance, initially requiring minA to correct  Cognition Arousal/Alertness: Awake/alert Behavior During Therapy: WFL for tasks assessed/performed Overall Cognitive Status: Within Functional Limits for tasks assessed                                 General Comments: pleasant throughout        Exercises      General Comments        Pertinent Vitals/Pain Pain Assessment Pain Assessment: No/denies pain    Home Living                          Prior Function            PT Goals (current goals can now be found in the care plan section) Acute Rehab PT Goals Patient Stated Goal: I want to get better PT Goal Formulation: With patient Time For Goal Achievement: 01/18/23 Potential to Achieve Goals: Good Progress towards PT goals: Progressing toward goals    Frequency    Min 4X/week      PT Plan Current plan remains appropriate    Co-evaluation              AM-PAC PT "6 Clicks" Mobility   Outcome Measure  Help needed turning from your back to your side while in a flat bed without using bedrails?: A Little Help needed moving from lying on your back to sitting on the side of a flat bed without using bedrails?: A Little Help needed moving to and from a bed to a chair (including a wheelchair)?: A Little Help needed standing up from a chair using your arms (e.g., wheelchair or bedside chair)?: A Little Help needed to walk in hospital room?: A Little Help needed climbing 3-5 steps with a railing? : A Lot 6 Click Score: 17    End of Session Equipment Utilized During Treatment: Gait belt Activity Tolerance: Patient tolerated treatment well Patient left: with call bell/phone within reach;in bed;with bed alarm set Nurse Communication: Mobility status PT Visit Diagnosis: Unsteadiness on feet (R26.81);Muscle weakness (generalized) (M62.81);Difficulty in walking, not elsewhere classified (R26.2)     Time: 4827-0786 PT Time Calculation (min) (ACUTE  ONLY): 25 min  Charges:  $Gait Training: 8-22 mins $Therapeutic Activity: 8-22 mins                     Charlynne Cousins, PT DPT Acute Rehabilitation Services Office [phone]    Luvenia Heller 01/06/2023, 2:07 PM

## 2023-01-06 NOTE — H&P (Addendum)
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 [name] at [facility]), readmission [facility] 11/17-11/28 for AMS, aspiration PNA w/melena and recs for strict NPO. He was readmitted on 12/27/2022 with right wrist drop, hypotension and fever T-101.6 F with AMS. He was noted to be septic with lactic acidosis and was influenza A positive.  MRI brain revealed acute/subacute left frontal lobe and high left parietal lobe infarct with 60-65% stenosis complex plaque L-ICA at origin, bulky calcified carotid siphon plaque L>R and severe R-VA stenosis. Incidental note made of visible retained food or debris in thoracic esophagus but only mild esophageal dilatation. He was treated with 4 L fluid boluses, Tamiflu and Vanc/Cefepime as well as stress dose steroids. Dr. Leonie Man felt that "stroke was embolic likely from cerebral hypoperfusion from sepsis with tandem stenosis of Left proximal and left cavernous carotid artery in setting of sepsis".  Carotid dopplers showed left 40-59% ICA stenosis. 2D echo showed EF 60-65% with no wall abnormality, moderate mitral and  mild aortic calcification. He had recurrent fever up to 102 on 01/04 and angiogram/stenting postponed. Blood cultures X 2 negative. He had worsening of right sided weakness with increase in aphasia 01/05 with hypotension and CT/CTA head  negative for bleed and no LVO. He was treated with fluid bolus and MRI brain repeated 01/06 due to ongoing symptoms showing multiple known left cerebral infarcts without new infarct or extension.    Bedside swallow was repeated on 01/06 with trial of liquids as patient reported being on full liquid diet PTA. NPO recommended due to chronic dysphagia and concerns of aspiration.  He underwent cerebral angio with left carotid stent by dr. Romie Jumper on 01/08 with recs for BP 100-140 range, ASA/Brilinta and recs for follow up with ultrasound in 3 months. He is divorced but was staying with ex-wife since discharge from SNF a couple of weeks PTA. Wife plans on providing 24/7 care after discharge.Marland Kitchen He continues to be limited by RUE weakness and decrease in right foot clearance, fatigue requiring multiple rest breaks, flexed posture, ulnar deviation with contractures bilateral hand due to RA, oropharyngeal/esophageadysphagia, cognitive deficits with decreased recall as well as poor awareness of deficits. CIR recommended due to functional decline.        Review of Systems  Constitutional:  Negative for chills and fever.  HENT:  Negative for hearing loss.   Eyes:  Negative for blurred vision and double vision.  Respiratory:  Positive for cough and sputum production.   Gastrointestinal:  Negative for constipation and heartburn.  Musculoskeletal:  Negative for back pain and myalgias.  Neurological:  Positive for weakness and headaches.  Psychiatric/Behavioral:  Positive for  memory loss.          Past Medical History: Diagnosis Date  Aortic aneurysm ([place]) 05/2017  Chronic back pain    Diabetes mellitus without complication (HCC)    Dyslipidemia    GERD  (gastroesophageal reflux disease)    H/O hiatal hernia    Hypertension    Melanoma ([place])     "back; left arm"  PONV (postoperative nausea and vomiting)    Rheumatoid arthritis ([place])    Spasm of esophagus        Past Surgical History: Procedure Laterality Date  aneursym repair      aneursym repair and stent placement   05/2017  BIOPSY   08/19/2022   Procedure: BIOPSY;  Surgeon: Ladene Artist, MD;  Location: [facility] ENDOSCOPY;  Service: Gastroenterology;;  CARPAL TUNNEL RELEASE Bilateral    COLON SURGERY   2007   removed 18" of colon  ERCP W/ METAL STENT PLACEMENT   11/2005   Archie Endo 12/14/2005  ESOPHAGOGASTRODUODENOSCOPY (EGD) WITH ESOPHAGEAL DILATION       "several times'  ESOPHAGOGASTRODUODENOSCOPY (EGD) WITH PROPOFOL N/A 08/19/2022   Procedure: ESOPHAGOGASTRODUODENOSCOPY (EGD) WITH PROPOFOL;  Surgeon: Ladene Artist, MD;  Location: [place];  Service: Gastroenterology;  Laterality: N/A;  HAND SURGERY Bilateral     "plastic knuckles"  IR ANGIO INTRA EXTRACRAN SEL COM CAROTID INNOMINATE UNI R MOD SED   01/03/2023  IR ANGIO VERTEBRAL SEL SUBCLAVIAN INNOMINATE UNI L MOD SED   01/03/2023  IR ANGIO VERTEBRAL SEL VERTEBRAL UNI R MOD SED   01/03/2023  IR INTRAVSC STENT CERV CAROTID W/EMB-PROT MOD SED INCL ANGIO   01/03/2023  IR US GUIDE VASC ACCESS RIGHT   01/03/2023  LAPAROSCOPIC CHOLECYSTECTOMY   01/2006  MELANOMA EXCISION       "back; left arm"  NASAL SEPTUM SURGERY      PELVIC FRACTURE SURGERY   1964   "busted in 2 places"  RADIOLOGY WITH ANESTHESIA N/A 01/03/2023   Procedure: IR WITH ANESTHESIA;  Surgeon: Radiologist, Medication, MD;  Location: [place];  Service: Radiology;  Laterality: N/A;  RESECTION DISTAL CLAVICAL Right 05/13/2016   Procedure: DISTAL CLAVICLE EXCISION;  Surgeon: Ninetta Lights, MD;  Location: [place];  Service: Orthopedics;  Laterality: Right;  SHOULDER ARTHROSCOPY WITH ROTATOR CUFF REPAIR AND SUBACROMIAL DECOMPRESSION Right 05/13/2016    Procedure: RIGHT SHOULDER SCOPE DEBRIDEMENT, ACROMIOPLASTY, ROTATOR CUFF REPAIR; RELEASE BICEPS TENDON AND DEBRIDEMENT LABRUM;  Surgeon: Ninetta Lights, MD;  Location: [place];  Service: Orthopedics;  Laterality: Right;  TIBIA FRACTURE SURGERY Left 2006   "put a steel rod in it"      Family History Problem Relation Age of Onset  Heart disease Mother    Heart disease Father    Cancer Maternal Grandmother       Social History: Divorced and has not worked for years. Used to be in Human resources officer? Per   reports that he quit smoking about 17 years ago. His smoking use included cigarettes. He has a 46.00 pack-year smoking history. He has never used smokeless tobacco. He reports current alcohol use of about 9.0 standard drinks of alcohol per week. He reports that he does not use drugs.     Allergies: No Known Allergies      Medications Prior to Admission Medication Sig Dispense Refill  abatacept (ORENCIA) 250 MG injection Inject 250 mg into the vein every 30 (thirty) days. (Patient taking differently: Inject 750 mg into the vein every 30 (thirty) days.) 1 each  12  atorvastatin (LIPITOR) 40 MG tablet Take 1 tablet (40 mg total) by mouth daily. 30 tablet 11  famotidine (PEPCID) 20 MG tablet Take 1 tablet (20 mg total) by mouth daily. 30 tablet 11  ferrous sulfate 300 (60 Fe) MG/5ML syrup Take 5 mLs (300 mg total) by mouth daily. 378 mL 3  folic acid (FOLVITE) 1 MG tablet Take 1 tablet (1 mg total) by mouth daily. 30 tablet 11  levothyroxine (SYNTHROID) 50 MCG tablet Take 1 tablet (50 mcg total) by mouth daily. 30 tablet 11  magnesium oxide (MAG-OX) 400 (240 Mg) MG tablet Take 1 tablet (400 mg total) by mouth daily. 30 tablet 11  metFORMIN (GLUCOPHAGE) 500 MG tablet Take 1 tablet (500 mg total) by mouth daily. 60 tablet 11  Methotrexate 25 MG/ML SOSY Inject 15 mg into the skin once a week.      ondansetron (ZOFRAN) 4 MG tablet Take 1 tablet (4 mg total) by mouth every  12 (twelve) hours as needed for nausea or vomiting. 20 tablet 0  oxyCODONE (OXY IR/ROXICODONE) 5 MG immediate release tablet Take 1 tablet (5 mg total) by mouth every 4 (four) hours as needed for moderate pain. 30 tablet 0  pantoprazole (PROTONIX) 40 MG tablet Take 1 tablet (40 mg total) by mouth 2 (two) times daily. Via tube      predniSONE (DELTASONE) 1 MG tablet Take 1-5 tablets (1-5 mg total) by mouth See admin instructions. 5 mg daily for 1 month  4 mg daily for 1 month, 3 mg daily for 1 month, 2 mg daily for 1 month, 1 mg daily for 1 month.      sucralfate (CARAFATE) 1 GM/10ML suspension Take 1 g by mouth 4 (four) times daily -  before meals and at bedtime.      tadalafil (CIALIS) 5 MG tablet Take 2 tablets (10 mg total) by mouth daily. 10 tablet 0  alum & mag hydroxide-simeth (MAALOX/MYLANTA) 200-200-20 MG/5ML suspension Take 30 mLs by mouth 3 (three) times daily as needed for indigestion or heartburn. (Patient not taking: Reported on 12/27/2022) 355 mL 0  diclofenac Sodium (VOLTAREN) 1 % GEL Apply 2 g topically 4 (four) times daily. (Patient not taking: Reported on 12/28/2022) 10 g 0  enoxaparin (LOVENOX) 40 MG/0.4ML injection Inject 40 mg into the skin daily. (Patient not taking: Reported on 12/28/2022)      lidocaine (XYLOCAINE) 2 % solution Use as directed 15 mLs in the mouth or throat 3 (three) times daily as needed for mouth pain (Indigestion). (Patient not taking: Reported on 12/28/2022) 100 mL 3         Home: Home Living Family/patient expects to be discharged to:: Private residence Living Arrangements:  (since 12/23 staying with ex wife, [name] since leaving SNF) Available Help at Discharge: Available 24 hours/day Type of Home: House Home Access: Stairs to enter [employer] of Steps: 2 Entrance Stairs-Rails:  (has rail but unsure what side) Home Layout: One level Bathroom Shower/Tub: Multimedia programmer: Standard Bathroom Accessibility: Yes Home  Equipment: Shower seat, Hand held shower head, Rollator (4 wheels) Additional 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; retired  Lives With: Other (Comment)   Functional History: Prior Function Prior Level of Function : Independent/Modified Independent 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 Comments: He reports he can complete his own selfcare   Functional Status:  Mobility: Bed Mobility Overal bed mobility: Needs Assistance Bed Mobility: Sit to Supine, Supine to Sit Rolling: Supervision Supine to sit: Min guard, HOB elevated Sit  to supine: Min guard, HOB elevated General bed mobility comments: with use of bedrail Transfers Overall transfer level: Needs assistance Equipment used: Rolling walker (2 wheels) Transfers: Sit to/from Stand Sit to Stand: Min guard Bed to/from chair/wheelchair/BSC transfer type:: Stand pivot Stand pivot transfers: Min assist Step pivot transfers: Min guard General transfer comment: declined getting OOB due to condum catheter and rectal pouch Ambulation/Gait Ambulation/Gait assistance: Min guard Gait Distance (Feet): 20 Feet (x4 trials with seated rest breaks) Assistive device: Rolling walker (2 wheels) Gait Pattern/deviations: Trunk flexed, Decreased stride length General Gait Details: downward gaze, intermittent cueing required for maintenance of R hand on RW, decreased R foot clearance with fatigue Gait velocity: WFL Gait velocity interpretation: <1.31 ft/sec, indicative of household ambulator Pre-gait activities: standing balance ck   ADL: ADL Overall ADL's : Needs assistance/impaired Eating/Feeding: Set up, Sitting Grooming: Wash/dry hands, Wash/dry face, Moderate assistance Grooming Details (indicate cue type and reason): simulated Upper Body Bathing: Minimal assistance, Sitting Upper Body Bathing Details (indicate cue type and reason): simulated Lower Body Bathing: Min guard, Sit to/from stand Lower Body Bathing Details (indicate cue type  and reason): simulated Upper Body Dressing : Minimal assistance, Sitting Upper Body Dressing Details (indicate cue type and reason): simulated Lower Body Dressing: Moderate assistance, Sit to/from stand Toilet Transfer: Min guard, Ambulation Toileting- Clothing Manipulation and Hygiene: Min guard, Sit to/from stand Functional mobility during ADLs: Moderate assistance, Cueing for sequencing, Cueing for safety General ADL Comments: focused on RUE HEP   Cognition: Cognition Overall Cognitive Status: Within Functional Limits for tasks assessed Arousal/Alertness: Awake/alert Orientation Level: Oriented X4 Year: Other (Comment) (2002) Attention: Sustained Sustained Attention: Impaired Sustained Attention Impairment: Verbal basic Memory: Impaired Memory Impairment: Storage deficit, Retrieval deficit, Decreased recall of new information Awareness: Impaired Awareness Impairment: Emergent impairment, Anticipatory impairment Problem Solving: Impaired Problem Solving Impairment: Verbal complex Safety/Judgment: Impaired Cognition Arousal/Alertness: Awake/alert Behavior During Therapy: WFL for tasks assessed/performed Overall Cognitive Status: Within Functional Limits for tasks assessed General Comments: becoming pleasant throughout session Difficult to assess due to: Impaired communication     Blood pressure (!) 123/53, pulse (!) 46, temperature 97.6 F (36.4 C), temperature source Oral, resp. rate 14, height '6\' 1"'$  (1.854 m), weight 75.7 kg, SpO2 98 %. Physical Exam Vitals and nursing note reviewed.  Constitutional:      Appearance: He is underweight. He is ill-appearing.     Comments: Sitting in bed with multiple cups at bedside--one with ice chips, one w/water and 2 w/ frothy yellow expectorated sputum.   Cardiac: RRR. No murmurs, rubs, or gallops. + RUE edema, mild.  Resp: CTAB. On RA. Thick yellow sputum in bedside cup.  Abdominal:     Comments: + PEG tube with dark crusty material  and loose bolster.   Genitourinary:    Comments: Rectal tube in place with liquid stool (patient unaware of it). Male purewick in place with clear urine output.  Skin:    General: Skin is warm and dry. +scarring on neck from prior trach, healed. +2X R forearm Ivs.  MSK: Severe ulnar deviation of fingers in L hand d/t RA; less pronounced on R hand.   Neurological:     Mental Status: He is alert.     Comments: Oriented to self, place, month,age, DOB and date as "the 10". Not oriented to event;  thought he was here for bleeding or passing out. He was unable to recall recent medical issues or names of his doctors except for PCP. He was able to follow simple  motor commands.   DTRs: Reflexes were 1+ in RUE and 3+ in RLE; 2+ in LUE and LLE. No clonus on ankle jerk.  Babinsky: flexor responses b/l.   Hoffmans: negative b/l Sensory exam: revealed normal sensation in all dermatomal regions in bilateral UE and LEs. Motor exam: strength normal in all myotomal regions of LUE and LLE. RUE 3/5 shoulder abduction, 4-/5 elbow flexion, 3+/5 elbow extension, 2+/5 wrist extension, 1-/5 finger abduction, 2+/5 grip RLE 4-/5 hip flexion, 5-/5 knee extension, 5/5 ankle DF, PF.   Lab Results Last 48 Hours Results for orders placed or performed during the hospital encounter of 12/27/22 (from the past 48 hour(s)) Glucose, capillary     Status: Abnormal   Collection Time: 01/04/23 11:39 AM Result Value Ref Range   Glucose-Capillary 192 (H) 70 - 99 mg/dL     Comment: Glucose reference range applies only to samples taken after fasting for at least 8 hours. Glucose, capillary     Status: Abnormal   Collection Time: 01/04/23  3:07 PM Result Value Ref Range   Glucose-Capillary 241 (H) 70 - 99 mg/dL     Comment: Glucose reference range applies only to samples taken after fasting for at least 8 hours. Glucose, capillary     Status: Abnormal   Collection Time: 01/04/23  7:06 PM Result Value Ref Range    Glucose-Capillary 266 (H) 70 - 99 mg/dL     Comment: Glucose reference range applies only to samples taken after fasting for at least 8 hours. Glucose, capillary     Status: Abnormal   Collection Time: 01/04/23  9:14 PM Result Value Ref Range   Glucose-Capillary 268 (H) 70 - 99 mg/dL     Comment: Glucose reference range applies only to samples taken after fasting for at least 8 hours. Basic metabolic panel     Status: Abnormal   Collection Time: 01/05/23  3:58 AM Result Value Ref Range   Sodium 136 135 - 145 mmol/L   Potassium 4.0 3.5 - 5.1 mmol/L   Chloride 106 98 - 111 mmol/L   CO2 21 (L) 22 - 32 mmol/L   Glucose, Bld 257 (H) 70 - 99 mg/dL     Comment: Glucose reference range applies only to samples taken after fasting for at least 8 hours.   BUN 26 (H) 8 - 23 mg/dL   Creatinine, Ser 0.67 0.61 - 1.24 mg/dL   Calcium 8.4 (L) 8.9 - 10.3 mg/dL   GFR, Estimated >60 >60 mL/min     Comment: (NOTE) Calculated using the CKD-EPI Creatinine Equation (2021)     Anion gap 9 5 - 15     Comment: Performed at [facility] [address] [place], [place] [address] Magnesium     Status: None   Collection Time: 01/05/23  3:58 AM Result Value Ref Range   Magnesium 1.7 1.7 - 2.4 mg/dL     Comment: Performed at [facility] [address] [place], [place] [address] Phosphorus     Status: Abnormal   Collection Time: 01/05/23  3:58 AM Result Value Ref Range   Phosphorus 2.1 (L) 2.5 - 4.6 mg/dL     Comment: Performed at [facility] [address], [place] [address] CBC     Status: Abnormal   Collection Time: 01/05/23  3:58 AM Result Value Ref Range   WBC 6.9 4.0 - 10.5 K/uL   RBC 2.95 (L) 4.22 - 5.81 MIL/uL   Hemoglobin 8.5 (L)  13.0 - 17.0 g/dL   HCT 25.8 (L) 39.0 - 52.0 %   MCV 87.5 80.0 - 100.0 fL   MCH 28.8 26.0 - 34.0 pg   MCHC 32.9 30.0 - 36.0 g/dL   RDW 17.7 (H) 11.5 - 15.5 %   Platelets 142 (L) 150 - 400 K/uL   nRBC 0.0 0.0 - 0.2 %      Comment: Performed at [facility] [address], [place], [place] [address] Glucose, capillary     Status: Abnormal   Collection Time: 01/05/23  6:15 PM Result Value Ref Range   Glucose-Capillary 228 (H) 70 - 99 mg/dL     Comment: Glucose reference range applies only to samples taken after fasting for at least 8 hours. Glucose, capillary     Status: Abnormal   Collection Time: 01/05/23 10:31 PM Result Value Ref Range   Glucose-Capillary 156 (H) 70 - 99 mg/dL     Comment: Glucose reference range applies only to samples taken after fasting for at least 8 hours. Glucose, capillary     Status: Abnormal   Collection Time: 01/05/23 11:51 PM Result Value Ref Range   Glucose-Capillary 183 (H) 70 - 99 mg/dL     Comment: Glucose reference range applies only to samples taken after fasting for at least 8 hours. Glucose, capillary     Status: Abnormal   Collection Time: 01/06/23  3:44 AM Result Value Ref Range   Glucose-Capillary 169 (H) 70 - 99 mg/dL     Comment: Glucose reference range applies only to samples taken after fasting for at least 8 hours. Phosphorus     Status: None   Collection Time: 01/06/23  4:39 AM Result Value Ref Range   Phosphorus 2.8 2.5 - 4.6 mg/dL     Comment: Performed at [facility], [place] [address] [place], [place] [address] Glucose, capillary     Status: Abnormal   Collection Time: 01/06/23  8:08 AM Result Value Ref Range   Glucose-Capillary 225 (H) 70 - 99 mg/dL     Comment: Glucose reference range applies only to samples taken after fasting for at least 8 hours.     Imaging Results (Last 48 hours) No results found.        Blood pressure (!) 123/53, pulse (!) 46, temperature 97.6 F (36.4 C), temperature source Oral, resp. rate 14, height '6\' 1"'$  (1.854 m), weight 75.7 kg, SpO2 98 %.   Medical Problem List and Plan: 1. 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  Antithrombotics: -DVT/anticoagulation:  Mechanical: Sequential compression devices, below knee Bilateral lower extremities due to recurrent GIB             -antiplatelet therapy:  Brilinta and ASA 3. Pain Management: N/A. Tylenol prn.  4. Mood/Behavior/Sleep: LCSW to follow for evaluation and support.              -antipsychotic agents: N/A 5. Neuropsych/cognition: This patient is not fully capable of making decisions on his own behalf. 6. Skin/Wound Care: Routine pressure relief. Continue tube feeds             --Continue Male pure wick and rectal tube tonight.  --Question patient awareness/continence.   --Will d/c in am and start toileting program.  7. Fluids/Electrolytes/Nutrition: Continue NPO with tube  feeds.              --he reports that he was cleared for ice by [facility]. Will reach out to confirm; NPO in interim given high aspiration risk. --On continuous tube feed-->will change CBGs to every 4 hours. .  8. Left carotid stent: Continue ASA/Brilinta.  9. 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 Will recheck in am. 10. 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 Resume PPI.  --Monitor H/H with serial checks. Has dropped from  10.3-->8.5 --Stool brown. Monitor for signs of bleeding.  11. Thrombocytopenia:Continue to monitor as have ranged187-->94-->142.             --monitor for signs of bleeding.  12. Aspiration PNA/Pneumonitis: On stress dose steriods D# 4/5.              -- 1/11 CXR 2 view w/ongoing frothy cough -> Decreased left-greater-than-right basilar opacities, likely resolving aspiration. Recommend follow-up chest x-ray in 3-4 weeks to ensure complete resolution.             --encourage flutter valve every 4 hrs WA.  13. T2DM: Hgb A1c-6.2. Monitor CBGs every 4 hours and use SSI for elevated BS             --on Prednisone to taper off in 24 hours. Monitor for improvement off this.  Recent Labs     01/06/23 0344 01/06/23 0808 01/06/23 1145  GLUCAP 169* 225* 116*     14.Influenza A: Completed Tamiflu X 5 days 15. RA: Was on monthly Orenzia w/ prednisone to 1 mg daily as well as methotrexate and tadalafil             --taper to home dose 01/13.  16. Code status: continue Full code per palliative care notes 12/15/22.    Dennis Leriche, PA-C 01/06/2023   I have examined the patient independently and edited the note for HPI, ROS, exam, assessment, and plan as appropriate. I am in agreement with the above recommendations.   Gertie Gowda, DO 01/06/2023

## 2023-01-06 NOTE — H&P (Incomplete)
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 [name] at [facility]), readmission [facility] 11/17-11/28 for AMS, aspiration PNA w/melena and recs for strict NPO. He was readmitted on 12/27/2022 with right wrist drop, hypotension and fever T-101.6 F with AMS. He was noted to be septic with lactic acidosis and was influenza A positive.  MRI brain revealed acute/subacute left frontal lobe and high left parietal lobe infarct with 60-65% stenosis complex plaque L-ICA at origin, bulky calcified carotid siphon plaque L>R and severe R-VA stenosis. Incidental note made of visible retained food or debris in thoracic esophagus but only mild esophageal dilatation. He was treated with 4 L fluid boluses, Tamiflu and Vanc/Cefepime as well as stress dose steroids. Dr. Leonie Man felt that "stroke was embolic likely from cerebral hypoperfusion from sepsis with tandem stenosis of Left proximal and left cavernous carotid artery in setting of sepsis".  Carotid dopplers showed left 40-59% ICA stenosis. 2D echo showed EF 60-65% with no wall abnormality, moderate mitral and mild aortic calcification. He had recurrent fever up to 102 on 01/04 and angiogram/stenting postponed. Blood cultures X 2 negative. He had worsening of right sided weakness with increase in aphasia 01/05 with hypotension and CT/CTA head  negative for bleed and no LVO. He was treated with fluid bolus and  MRI brain repeated 01/06 due to ongoing symptoms showing multiple known left cerebral infarcts without new infarct or extension.   Bedside swallow was repeated on 01/06 with trial of liquids as patient reported being on full liquid diet PTA. NPO recommended due to chronic dysphagia and concerns of aspiration.  He underwent cerebral angio with left carotid stent by dr. Romie Jumper on 01/08 with recs for BP 100-140 range, ASA/Brilinta and recs for follow up with ultrasound in 3 months. He is divorced but was staying with ex-wife since discharge from SNF a couple of weeks PTA. Wife plans on providing 24/7 care after discharge.Marland Kitchen He continues to be limited by RUE weakness and decrease in right foot clearance, fatigue requiring multiple rest breaks, flexed posture, ulnar deviation with contractures bilateral hand due to RA, oropharyngeal/esophageadysphagia, cognitive deficits with decreased recall as well as poor awareness of deficits. CIR recommended due to functional decline.     Review of Systems  Constitutional:  Negative for chills and fever.  HENT:  Negative for hearing loss.   Eyes:  Negative for blurred vision and double vision.  Respiratory:  Positive for cough and sputum production.   Gastrointestinal:  Negative for constipation and heartburn.  Musculoskeletal:  Negative for back pain and myalgias.  Neurological:  Positive for weakness and headaches.  Psychiatric/Behavioral:  Positive for memory loss.      Past Medical History:  Diagnosis Date   Aortic aneurysm ([place]) 05/2017   Chronic back pain    Diabetes mellitus without complication (HCC)    Dyslipidemia    GERD (gastroesophageal reflux disease)    H/O hiatal hernia    Hypertension    Melanoma ([place])    "back;  left arm"   PONV (postoperative nausea and vomiting)    Rheumatoid arthritis ([place])    Spasm of esophagus     Past Surgical History:  Procedure Laterality Date   aneursym repair     aneursym repair and stent  placement  05/2017   BIOPSY  08/19/2022   Procedure: BIOPSY;  Surgeon: Ladene Artist, MD;  Location: [facility];  Service: Gastroenterology;;   CARPAL TUNNEL RELEASE Bilateral    COLON SURGERY  2007   removed 18" of colon   ERCP W/ METAL STENT PLACEMENT  11/2005   Archie Endo 12/14/2005   ESOPHAGOGASTRODUODENOSCOPY (EGD) WITH ESOPHAGEAL DILATION     "several times'   ESOPHAGOGASTRODUODENOSCOPY (EGD) WITH PROPOFOL N/A 08/19/2022   Procedure: ESOPHAGOGASTRODUODENOSCOPY (EGD) WITH PROPOFOL;  Surgeon: Ladene Artist, MD;  Location: [place];  Service: Gastroenterology;  Laterality: N/A;   HAND SURGERY Bilateral    "plastic knuckles"   IR ANGIO INTRA EXTRACRAN SEL COM CAROTID INNOMINATE UNI R MOD SED  01/03/2023   IR ANGIO VERTEBRAL SEL SUBCLAVIAN INNOMINATE UNI L MOD SED  01/03/2023   IR ANGIO VERTEBRAL SEL VERTEBRAL UNI R MOD SED  01/03/2023   IR INTRAVSC STENT CERV CAROTID W/EMB-PROT MOD SED INCL ANGIO  01/03/2023   IR US GUIDE VASC ACCESS RIGHT  01/03/2023   LAPAROSCOPIC CHOLECYSTECTOMY  01/2006   MELANOMA EXCISION     "back; left arm"   NASAL SEPTUM SURGERY     PELVIC FRACTURE SURGERY  1964   "busted in 2 places"   RADIOLOGY WITH ANESTHESIA N/A 01/03/2023   Procedure: IR WITH ANESTHESIA;  Surgeon: Radiologist, Medication, MD;  Location: [place];  Service: Radiology;  Laterality: N/A;   RESECTION DISTAL CLAVICAL Right 05/13/2016   Procedure: DISTAL CLAVICLE EXCISION;  Surgeon: Ninetta Lights, MD;  Location: [place];  Service: Orthopedics;  Laterality: Right;   SHOULDER ARTHROSCOPY WITH ROTATOR CUFF REPAIR AND SUBACROMIAL DECOMPRESSION Right 05/13/2016   Procedure: RIGHT SHOULDER SCOPE DEBRIDEMENT, ACROMIOPLASTY, ROTATOR CUFF REPAIR; RELEASE BICEPS TENDON AND DEBRIDEMENT LABRUM;  Surgeon: Ninetta Lights, MD;  Location: [place];  Service: Orthopedics;  Laterality: Right;   TIBIA FRACTURE SURGERY Left 2006   "put a steel rod in it"    Family History   Problem Relation Age of Onset   Heart disease Mother    Heart disease Father    Cancer Maternal Grandmother     Social History: Divorced and has not worked for years. Used to be in Human resources officer? Per   reports that he quit smoking about 17 years ago. His smoking use included cigarettes. He has a 46.00 pack-year smoking history. He has never used smokeless tobacco. He reports current alcohol use of about 9.0 standard drinks of alcohol per week. He reports that he does not use drugs.   Allergies: No Known Allergies   Medications Prior to Admission  Medication Sig Dispense Refill   abatacept (ORENCIA) 250 MG injection Inject 250 mg into the vein every 30 (thirty) days. (Patient taking differently: Inject 750 mg into the vein every 30 (thirty) days.) 1 each 12   atorvastatin (LIPITOR) 40 MG tablet Take 1 tablet (40 mg total) by mouth daily. 30 tablet 11   famotidine (PEPCID) 20 MG tablet Take 1 tablet (20 mg total) by mouth daily. 30 tablet 11   ferrous sulfate 300 (60 Fe) MG/5ML syrup Take 5 mLs (300 mg total) by mouth daily. 440 mL 3   folic acid (FOLVITE) 1 MG tablet Take  1 tablet (1 mg total) by mouth daily. 30 tablet 11   levothyroxine (SYNTHROID) 50 MCG tablet Take 1 tablet (50 mcg total) by mouth daily. 30 tablet 11   magnesium oxide (MAG-OX) 400 (240 Mg) MG tablet Take 1 tablet (400 mg total) by mouth daily. 30 tablet 11   metFORMIN (GLUCOPHAGE) 500 MG tablet Take 1 tablet (500 mg total) by mouth daily. 60 tablet 11   Methotrexate 25 MG/ML SOSY Inject 15 mg into the skin once a week.     ondansetron (ZOFRAN) 4 MG tablet Take 1 tablet (4 mg total) by mouth every 12 (twelve) hours as needed for nausea or vomiting. 20 tablet 0   oxyCODONE (OXY IR/ROXICODONE) 5 MG immediate release tablet Take 1 tablet (5 mg total) by mouth every 4 (four) hours as needed for moderate pain. 30 tablet 0   pantoprazole (PROTONIX) 40 MG tablet Take 1 tablet (40 mg total) by mouth 2 (two) times daily.  Via tube     predniSONE (DELTASONE) 1 MG tablet Take 1-5 tablets (1-5 mg total) by mouth See admin instructions. 5 mg daily for 1 month  4 mg daily for 1 month, 3 mg daily for 1 month, 2 mg daily for 1 month, 1 mg daily for 1 month.     sucralfate (CARAFATE) 1 GM/10ML suspension Take 1 g by mouth 4 (four) times daily -  before meals and at bedtime.     tadalafil (CIALIS) 5 MG tablet Take 2 tablets (10 mg total) by mouth daily. 10 tablet 0   alum & mag hydroxide-simeth (MAALOX/MYLANTA) 200-200-20 MG/5ML suspension Take 30 mLs by mouth 3 (three) times daily as needed for indigestion or heartburn. (Patient not taking: Reported on 12/27/2022) 355 mL 0   diclofenac Sodium (VOLTAREN) 1 % GEL Apply 2 g topically 4 (four) times daily. (Patient not taking: Reported on 12/28/2022) 10 g 0   enoxaparin (LOVENOX) 40 MG/0.4ML injection Inject 40 mg into the skin daily. (Patient not taking: Reported on 12/28/2022)     lidocaine (XYLOCAINE) 2 % solution Use as directed 15 mLs in the mouth or throat 3 (three) times daily as needed for mouth pain (Indigestion). (Patient not taking: Reported on 12/28/2022) 100 mL 3      Home: Home Living Family/patient expects to be discharged to:: Private residence Living Arrangements:  (since 12/23 staying with ex wife, [name] since leaving SNF) Available Help at Discharge: Available 24 hours/day Type of Home: House Home Access: Stairs to enter [employer] of Steps: 2 Entrance Stairs-Rails:  (has rail but unsure what side) Home Layout: One level Bathroom Shower/Tub: Multimedia programmer: Standard Bathroom Accessibility: Yes Home Equipment: Shower seat, Hand held shower head, Rollator (4 wheels) Additional 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; retired  Lives With: Other (Comment)   Functional History: Prior Function Prior Level of Function : Independent/Modified Independent 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 Comments: He reports he can  complete his own selfcare  Functional Status:  Mobility: Bed Mobility Overal bed mobility: Needs Assistance Bed Mobility: Sit to Supine, Supine to Sit Rolling: Supervision Supine to sit: Min guard, HOB elevated Sit to supine: Min guard, HOB elevated General bed mobility comments: with use of bedrail Transfers Overall transfer level: Needs assistance Equipment used: Rolling walker (2 wheels) Transfers: Sit to/from Stand Sit to Stand: Min guard Bed to/from chair/wheelchair/BSC transfer type:: Stand pivot Stand pivot transfers: Min assist Step pivot transfers: Min guard General transfer comment: declined getting OOB due to condum catheter and rectal pouch  Ambulation/Gait Ambulation/Gait assistance: Min guard Gait Distance (Feet): 20 Feet (x4 trials with seated rest breaks) Assistive device: Rolling walker (2 wheels) Gait Pattern/deviations: Trunk flexed, Decreased stride length General Gait Details: downward gaze, intermittent cueing required for maintenance of R hand on RW, decreased R foot clearance with fatigue Gait velocity: WFL Gait velocity interpretation: <1.31 ft/sec, indicative of household ambulator Pre-gait activities: standing balance ck    ADL: ADL Overall ADL's : Needs assistance/impaired Eating/Feeding: Set up, Sitting Grooming: Wash/dry hands, Wash/dry face, Moderate assistance Grooming Details (indicate cue type and reason): simulated Upper Body Bathing: Minimal assistance, Sitting Upper Body Bathing Details (indicate cue type and reason): simulated Lower Body Bathing: Min guard, Sit to/from stand Lower Body Bathing Details (indicate cue type and reason): simulated Upper Body Dressing : Minimal assistance, Sitting Upper Body Dressing Details (indicate cue type and reason): simulated Lower Body Dressing: Moderate assistance, Sit to/from stand Toilet Transfer: Min guard, Ambulation Toileting- Clothing Manipulation and Hygiene: Min guard, Sit to/from  stand Functional mobility during ADLs: Moderate assistance, Cueing for sequencing, Cueing for safety General ADL Comments: focused on RUE HEP  Cognition: Cognition Overall Cognitive Status: Within Functional Limits for tasks assessed Arousal/Alertness: Awake/alert Orientation Level: Oriented X4 Year: Other (Comment) (2002) Attention: Sustained Sustained Attention: Impaired Sustained Attention Impairment: Verbal basic Memory: Impaired Memory Impairment: Storage deficit, Retrieval deficit, Decreased recall of new information Awareness: Impaired Awareness Impairment: Emergent impairment, Anticipatory impairment Problem Solving: Impaired Problem Solving Impairment: Verbal complex Safety/Judgment: Impaired Cognition Arousal/Alertness: Awake/alert Behavior During Therapy: WFL for tasks assessed/performed Overall Cognitive Status: Within Functional Limits for tasks assessed General Comments: becoming pleasant throughout session Difficult to assess due to: Impaired communication   Blood pressure (!) 123/53, pulse (!) 46, temperature 97.6 F (36.4 C), temperature source Oral, resp. rate 14, height '6\' 1"'$  (1.854 m), weight 75.7 kg, SpO2 98 %. Physical Exam Vitals and nursing note reviewed.  Constitutional:      Appearance: He is underweight. He is ill-appearing.     Comments: Sitting in bed with multiple cups at bedside--one with ice chips, one w/water and 2 w/ frothy expectorated sputum.   Abdominal:     Comments: PEG tube with dark crusty material and loose bolster.   Genitourinary:    Comments: Rectal tube in place with liquid stool (patient unaware of it). Male purewick in place.  Skin:    General: Skin is warm and dry.  Neurological:     Mental Status: He is alert.     Comments: Oriented to self, place, month,age, DOB and date as "the 10". But thought he was here for bleeding or passing out. He was unable to recall recent medical issues or names of his doctors except for PCP. He  was able to follow simple motor commands.  Noted to have some weakness left hip?     Results for orders placed or performed during the hospital encounter of 12/27/22 (from the past 48 hour(s))  Glucose, capillary     Status: Abnormal   Collection Time: 01/04/23 11:39 AM  Result Value Ref Range   Glucose-Capillary 192 (H) 70 - 99 mg/dL    Comment: Glucose reference range applies only to samples taken after fasting for at least 8 hours.  Glucose, capillary     Status: Abnormal   Collection Time: 01/04/23  3:07 PM  Result Value Ref Range   Glucose-Capillary 241 (H) 70 - 99 mg/dL    Comment: Glucose reference range applies only to samples taken after fasting for at least 8  hours.  Glucose, capillary     Status: Abnormal   Collection Time: 01/04/23  7:06 PM  Result Value Ref Range   Glucose-Capillary 266 (H) 70 - 99 mg/dL    Comment: Glucose reference range applies only to samples taken after fasting for at least 8 hours.  Glucose, capillary     Status: Abnormal   Collection Time: 01/04/23  9:14 PM  Result Value Ref Range   Glucose-Capillary 268 (H) 70 - 99 mg/dL    Comment: Glucose reference range applies only to samples taken after fasting for at least 8 hours.  Basic metabolic panel     Status: Abnormal   Collection Time: 01/05/23  3:58 AM  Result Value Ref Range   Sodium 136 135 - 145 mmol/L   Potassium 4.0 3.5 - 5.1 mmol/L   Chloride 106 98 - 111 mmol/L   CO2 21 (L) 22 - 32 mmol/L   Glucose, Bld 257 (H) 70 - 99 mg/dL    Comment: Glucose reference range applies only to samples taken after fasting for at least 8 hours.   BUN 26 (H) 8 - 23 mg/dL   Creatinine, Ser 0.67 0.61 - 1.24 mg/dL   Calcium 8.4 (L) 8.9 - 10.3 mg/dL   GFR, Estimated >60 >60 mL/min    Comment: (NOTE) Calculated using the CKD-EPI Creatinine Equation (2021)    Anion gap 9 5 - 15    Comment: Performed at [facility] [address], [place], [place] [address]  Magnesium     Status: None   Collection  Time: 01/05/23  3:58 AM  Result Value Ref Range   Magnesium 1.7 1.7 - 2.4 mg/dL    Comment: Performed at [facility] [address] [place], [place] [address]  Phosphorus     Status: Abnormal   Collection Time: 01/05/23  3:58 AM  Result Value Ref Range   Phosphorus 2.1 (L) 2.5 - 4.6 mg/dL    Comment: Performed at [facility] [address], [place], [place] [address]  CBC     Status: Abnormal   Collection Time: 01/05/23  3:58 AM  Result Value Ref Range   WBC 6.9 4.0 - 10.5 K/uL   RBC 2.95 (L) 4.22 - 5.81 MIL/uL   Hemoglobin 8.5 (L) 13.0 - 17.0 g/dL   HCT 25.8 (L) 39.0 - 52.0 %   MCV 87.5 80.0 - 100.0 fL   MCH 28.8 26.0 - 34.0 pg   MCHC 32.9 30.0 - 36.0 g/dL   RDW 17.7 (H) 11.5 - 15.5 %   Platelets 142 (L) 150 - 400 K/uL   nRBC 0.0 0.0 - 0.2 %    Comment: Performed at [facility], [place] [address], [place], [place] [address]  Glucose, capillary     Status: Abnormal   Collection Time: 01/05/23  6:15 PM  Result Value Ref Range   Glucose-Capillary 228 (H) 70 - 99 mg/dL    Comment: Glucose reference range applies only to samples taken after fasting for at least 8 hours.  Glucose, capillary     Status: Abnormal   Collection Time: 01/05/23 10:31 PM  Result Value Ref Range   Glucose-Capillary 156 (H) 70 - 99 mg/dL    Comment: Glucose reference range applies only to samples taken after fasting for at least 8 hours.  Glucose, capillary     Status: Abnormal   Collection Time: 01/05/23 11:51 PM  Result Value Ref Range   Glucose-Capillary 183 (H)  70 - 99 mg/dL    Comment: Glucose reference range applies only to samples taken after fasting for at least 8 hours.  Glucose, capillary     Status: Abnormal   Collection Time: 01/06/23  3:44 AM  Result Value Ref Range   Glucose-Capillary 169 (H) 70 - 99 mg/dL    Comment: Glucose reference range applies only to samples taken after fasting for at least 8 hours.  Phosphorus     Status: None   Collection Time: 01/06/23  4:39  AM  Result Value Ref Range   Phosphorus 2.8 2.5 - 4.6 mg/dL    Comment: Performed at [facility], [place] [address] [place], [place] [address]  Glucose, capillary     Status: Abnormal   Collection Time: 01/06/23  8:08 AM  Result Value Ref Range   Glucose-Capillary 225 (H) 70 - 99 mg/dL    Comment: Glucose reference range applies only to samples taken after fasting for at least 8 hours.   No results found.    Blood pressure (!) 123/53, pulse (!) 46, temperature 97.6 F (36.4 C), temperature source Oral, resp. rate 14, height '6\' 1"'$  (1.854 m), weight 75.7 kg, SpO2 98 %.  Medical Problem List and Plan: 1. Functional deficits secondary to ***  -patient may *** shower  -ELOS/Goals: *** 2.  Antithrombotics: -DVT/anticoagulation:  Mechanical: Sequential compression devices, below knee Bilateral lower extremities due to recurrent GIB  -antiplatelet therapy:  Brilinta and ASA 3. Pain Management: N/A. Tylenol prn.  4. Mood/Behavior/Sleep: LCSW to follow for evaluation and support.   -antipsychotic agents: N/A 5. Neuropsych/cognition: This patient is not fully capable of making decisions on his own behalf. 6. Skin/Wound Care: Routine pressure relief.Continue tube feeds  --Continue Male pure wick and rectal tube tonight.  --Question patient awareness/continence.   --Will d/c in am and start toileting program.  7. Fluids/Electrolytes/Nutrition: Continue NPO with tube feeds.   --he reports that he was cleared for ice by [facility]. Will reach out to confirm.  --On continuous tube feed-->will change CBGs to every 4 hours. .  8. Left carotid stent: Continue ASA/Brilinta.  9. SCC of tongue s/p chemo/XRT/Barsony Parsonage syndrome: NPO  --Severe malnutrition with low Phos/Mg levels. Will recheck in am. 10. 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 Resume PPI.  --Monitor H/H with serial checks. Has dropped from  10.3-->8.5 --Stool brown. Monitor for signs of bleeding.  11. Thrombocytopenia:Continue to monitor  as have ranged187-->94-->142.  --monitor for signs of bleeding.  12. Aspiration PNA/Pneumonitis: On stress dose steriods D#4/5.   --will check CXR 2 view w/ongoing frothy cough.  --encourage flutter valve every 4 hrs WA. 13. T2DM: Hgb A1c-6.2. Monitor CBGs every 4 hours and use SSI for elevated BS  --on Prednisone to taper off in 24 hours. Monitor for improvement off this.  14.Influenza A: Completed Tamiflu X 5 days 15. RA: Was on monthly Orenzia w/ prednisone to 1 mg daily as well as methotrexate and tadalafil  --taper to home dose 01/13.  16. Code status: continue Full code per palliative care notes 12/15/22.      ***  [name], PA-C 01/06/2023

## 2023-01-06 NOTE — Progress Notes (Signed)
Inpatient Rehabilitation Admissions Coordinator    I have CIR bed and approval to admit today. I met with patient at bedside and he is in agreement. I will update acute team and TOC to make the arrangements.  Danne Baxter, RN, MSN Rehab Admissions Coordinator [phone] 01/06/2023 10:17 AM

## 2023-01-07 ENCOUNTER — Inpatient Hospital Stay (HOSPITAL_COMMUNITY): Payer: Medicare Other

## 2023-01-07 DIAGNOSIS — I639 Cerebral infarction, unspecified: Secondary | ICD-10-CM

## 2023-01-07 LAB — CBC WITH DIFFERENTIAL/PLATELET
Abs Immature Granulocytes: 0.12 10*3/uL — ABNORMAL HIGH (ref 0.00–0.07)
Basophils Absolute: 0 10*3/uL (ref 0.0–0.1)
Basophils Relative: 0 %
Eosinophils Absolute: 0.3 10*3/uL (ref 0.0–0.5)
Eosinophils Relative: 3 %
HCT: 27.8 % — ABNORMAL LOW (ref 39.0–52.0)
Hemoglobin: 9 g/dL — ABNORMAL LOW (ref 13.0–17.0)
Immature Granulocytes: 1 %
Lymphocytes Relative: 9 %
Lymphs Abs: 0.8 10*3/uL (ref 0.7–4.0)
MCH: 28.5 pg (ref 26.0–34.0)
MCHC: 32.4 g/dL (ref 30.0–36.0)
MCV: 88 fL (ref 80.0–100.0)
Monocytes Absolute: 1.1 10*3/uL — ABNORMAL HIGH (ref 0.1–1.0)
Monocytes Relative: 12 %
Neutro Abs: 6.7 10*3/uL (ref 1.7–7.7)
Neutrophils Relative %: 75 %
Platelets: 232 10*3/uL (ref 150–400)
RBC: 3.16 MIL/uL — ABNORMAL LOW (ref 4.22–5.81)
RDW: 17.6 % — ABNORMAL HIGH (ref 11.5–15.5)
WBC: 9 10*3/uL (ref 4.0–10.5)
nRBC: 0 % (ref 0.0–0.2)

## 2023-01-07 LAB — COMPREHENSIVE METABOLIC PANEL
ALT: 42 U/L (ref 0–44)
AST: 32 U/L (ref 15–41)
Albumin: 2.5 g/dL — ABNORMAL LOW (ref 3.5–5.0)
Alkaline Phosphatase: 66 U/L (ref 38–126)
Anion gap: 11 (ref 5–15)
BUN: 20 mg/dL (ref 8–23)
CO2: 23 mmol/L (ref 22–32)
Calcium: 8.4 mg/dL — ABNORMAL LOW (ref 8.9–10.3)
Chloride: 102 mmol/L (ref 98–111)
Creatinine, Ser: 0.71 mg/dL (ref 0.61–1.24)
GFR, Estimated: >60 mL/min (ref >60)
Glucose, Bld: 100 mg/dL — ABNORMAL HIGH (ref 70–99)
Potassium: 3.5 mmol/L (ref 3.5–5.1)
Sodium: 136 mmol/L (ref 135–145)
Total Bilirubin: 0.7 mg/dL (ref 0.3–1.2)
Total Protein: 5.5 g/dL — ABNORMAL LOW (ref 6.5–8.1)

## 2023-01-07 LAB — GLUCOSE, CAPILLARY
Glucose-Capillary: 102 mg/dL — ABNORMAL HIGH (ref 70–99)
Glucose-Capillary: 106 mg/dL — ABNORMAL HIGH (ref 70–99)
Glucose-Capillary: 107 mg/dL — ABNORMAL HIGH (ref 70–99)
Glucose-Capillary: 128 mg/dL — ABNORMAL HIGH (ref 70–99)
Glucose-Capillary: 159 mg/dL — ABNORMAL HIGH (ref 70–99)

## 2023-01-07 LAB — VITAMIN D 25 HYDROXY (VIT D DEFICIENCY, FRACTURES): Vit D, 25-Hydroxy: 43.97 ng/mL (ref 30–100)

## 2023-01-07 LAB — PHOSPHORUS: Phosphorus: 3.6 mg/dL (ref 2.5–4.6)

## 2023-01-07 LAB — MAGNESIUM: Magnesium: 1.3 mg/dL — ABNORMAL LOW (ref 1.7–2.4)

## 2023-01-07 MED ORDER — OSMOLITE 1.5 CAL PO LIQD
1000.0000 mL | ORAL | Status: DC
  Administered 2023-01-07 – 2023-01-14 (×12): 1000 mL
  Filled 2023-01-07 (×13): qty 1000

## 2023-01-07 MED ORDER — PROSOURCE TF20 ENFIT COMPATIBL EN LIQD
60.0000 mL | Freq: Every day | ENTERAL | Status: DC
  Administered 2023-01-07 – 2023-01-15 (×9): 60 mL
  Filled 2023-01-07 (×9): qty 60

## 2023-01-07 MED ORDER — LOPERAMIDE HCL 2 MG PO CAPS
2.0000 mg | ORAL_CAPSULE | ORAL | Status: DC | PRN
  Administered 2023-01-07: 2 mg via ORAL
  Filled 2023-01-07: qty 1

## 2023-01-07 NOTE — Progress Notes (Signed)
Inpatient Rehabilitation Admission Medication Review by a Pharmacist  A complete drug regimen review was completed for this patient to identify any potential clinically significant medication issues.  High Risk Drug Classes Is patient taking? Indication by Medication  Antipsychotic Yes Compazine (PO/IM/PR) - nausea  Anticoagulant No   Antibiotic No   Opioid No   Antiplatelet Yes ASA, Ticagrelor - stroke ppx  Hypoglycemics/insulin Yes Novolog - T2DM  Vasoactive Medication No   Chemotherapy No   Other Yes Benadryl - itching Robitussin DM - cough Trazodone - sleep Atorvastatin - HLD Osmolite, Prosource - tube feeds Synthroid - hypothyroid Prednisone - adrenal insufficiency     Type of Medication Issue Identified Description of Issue Recommendation(s)  Drug Interaction(s) (clinically significant)     Duplicate Therapy     Allergy     No Medication Administration End Date     Incorrect Dose     Additional Drug Therapy Needed     Significant med changes from prior encounter (inform family/care partners about these prior to discharge).    Other  PTA meds not yet resumed: metformin, oxycodone, carafate     Clinically significant medication issues were identified that warrant physician communication and completion of prescribed/recommended actions by midnight of the next day:  No  Name of provider notified for urgent issues identified:   Provider Method of Notification:    Pharmacist comments: F/u DAPT length of therapy  Time spent performing this drug regimen review (minutes):  [place], PharmD, BCPS 01/06/2023 12:40 PM

## 2023-01-07 NOTE — Progress Notes (Signed)
Patient ID: [name], male   DOB: Oct 17, 1946, 77 y.o.   MRN: [id number]  Spoke with pt who is exhausted today and not wanting to answer my questions to complete the assessment. Have left a message for his son-[name] to return my call. He is the spokesperson for pt. Await return call

## 2023-01-07 NOTE — Plan of Care (Signed)
Problem: RH Balance Goal: LTG Patient will maintain dynamic standing with ADLs (OT) Description: LTG:  Patient will maintain dynamic standing balance with assist during activities of daily living (OT)  Flowsheets (Taken 01/07/2023 1249) LTG: Pt will maintain dynamic standing balance during ADLs with: Independent with assistive device   Problem: Sit to Stand Goal: LTG:  Patient will perform sit to stand in prep for activites of daily living with assistance level (OT) Description: LTG:  Patient will perform sit to stand in prep for activites of daily living with assistance level (OT) Flowsheets (Taken 01/07/2023 1249) LTG: PT will perform sit to stand in prep for activites of daily living with assistance level: Independent with assistive device   Problem: RH Grooming Goal: LTG Patient will perform grooming w/assist,cues/equip (OT) Description: LTG: Patient will perform grooming with assist, with/without cues using equipment (OT) Flowsheets (Taken 01/07/2023 1249) LTG: Pt will perform grooming with assistance level of: Independent with assistive device    Problem: RH Bathing Goal: LTG Patient will bathe all body parts with assist levels (OT) Description: LTG: Patient will bathe all body parts with assist levels (OT) Flowsheets (Taken 01/07/2023 1249) LTG: Pt will perform bathing with assistance level/cueing: Independent with assistive device  LTG: Position pt will perform bathing: At sink   Problem: RH Dressing Goal: LTG Patient will perform upper body dressing (OT) Description: LTG Patient will perform upper body dressing with assist, with/without cues (OT). Flowsheets (Taken 01/07/2023 1249) LTG: Pt will perform upper body dressing with assistance level of: Independent with assistive device Goal: LTG Patient will perform lower body dressing w/assist (OT) Description: LTG: Patient will perform lower body dressing with assist, with/without cues in positioning using equipment (OT) Flowsheets  (Taken 01/07/2023 1249) LTG: Pt will perform lower body dressing with assistance level of: Independent with assistive device   Problem: RH Toileting Goal: LTG Patient will perform toileting task (3/3 steps) with assistance level (OT) Description: LTG: Patient will perform toileting task (3/3 steps) with assistance level (OT)  Flowsheets (Taken 01/07/2023 1249) LTG: Pt will perform toileting task (3/3 steps) with assistance level: Independent with assistive device   Problem: RH Functional Use of Upper Extremity Goal: LTG Patient will use RT/LT upper extremity as a (OT) Description: LTG: Patient will use right/left upper extremity as a stabilizer/gross assist/diminished/nondominant/dominant level with assist, with/without cues during functional activity (OT) Flowsheets (Taken 01/07/2023 1249) LTG: Use of upper extremity in functional activities: RUE as diminished level LTG: Pt will use upper extremity in functional activity with assistance level of: Minimal Assistance - Patient > 75%   Problem: RH Toilet Transfers Goal: LTG Patient will perform toilet transfers w/assist (OT) Description: LTG: Patient will perform toilet transfers with assist, with/without cues using equipment (OT) Flowsheets (Taken 01/07/2023 1249) LTG: Pt will perform toilet transfers with assistance level of: Independent with assistive device   Problem: RH Tub/Shower Transfers Goal: LTG Patient will perform tub/shower transfers w/assist (OT) Description: LTG: Patient will perform tub/shower transfers with assist, with/without cues using equipment (OT) Flowsheets (Taken 01/07/2023 1249) LTG: Pt will perform tub/shower stall transfers with assistance level of: Contact Guard/Touching assist LTG: Pt will perform tub/shower transfers from:  Tub/shower combination  Walk in shower   Problem: RH Attention Goal: LTG Patient will demonstrate this level of attention during functional activites (OT) Description: LTG:  Patient will  demonstrate this level of attention during functional activites  (OT) Flowsheets (Taken 01/07/2023 1249) Patient will demonstrate this level of attention during functional activites:  Selective  Alternating Patient will  demonstrate above attention level in the following environment: Home LTG: Patient will demonstrate this level of attention during functional activites (OT): Supervision   Problem: RH Awareness Goal: LTG: Patient will demonstrate awareness during functional activites type of (OT) Description: LTG: Patient will demonstrate awareness during functional activites type of (OT) Flowsheets (Taken 01/07/2023 1249) Patient will demonstrate awareness during functional activites type of: Anticipatory LTG: Patient will demonstrate awareness during functional activites type of (OT): Supervision

## 2023-01-07 NOTE — Evaluation (Signed)
Occupational Therapy Assessment and Plan  Patient Details  Name: [name] MRN: [id number] Date of Birth: 1946-07-31  OT Diagnosis: abnormal posture, acute pain, cognitive deficits, hemiplegia affecting dominant side, muscular wasting and disuse atrophy, and muscle weakness (generalized) Rehab Potential: Rehab Potential (ACUTE ONLY): Good ELOS: 10-12 days   Today's Date: 01/07/2023 OT Individual Time: 2841-3244 OT Individual Time Calculation (min): 73 min     Hospital Problem: Principal Problem:   Acute ischemic left middle cerebral artery (MCA) stroke ([place])   Past Medical History:  Past Medical History:  Diagnosis Date   Aortic aneurysm ([place]) 05/2017   Chronic back pain    Diabetes mellitus without complication (HCC)    Dyslipidemia    GERD (gastroesophageal reflux disease)    H/O hiatal hernia    Hypertension    Melanoma ([place])    "back; left arm"   PONV (postoperative nausea and vomiting)    Rheumatoid arthritis ([place])    Spasm of esophagus    Past Surgical History:  Past Surgical History:  Procedure Laterality Date   aneursym repair     aneursym repair and stent placement  05/2017   BIOPSY  08/19/2022   Procedure: BIOPSY;  Surgeon: Ladene Artist, MD;  Location[facility] ENDOSCOPY;  Service: Gastroenterology;;   CARPAL TUNNEL RELEASE Bilateral    COLON SURGERY  2007   removed 18" of colon   ERCP W/ METAL STENT PLACEMENT  11/2005   Archie Endo 12/14/2005   ESOPHAGOGASTRODUODENOSCOPY (EGD) WITH ESOPHAGEAL DILATION     "several times'   ESOPHAGOGASTRODUODENOSCOPY (EGD) WITH PROPOFOL N/A 08/19/2022   Procedure: ESOPHAGOGASTRODUODENOSCOPY (EGD) WITH PROPOFOL;  Surgeon: Ladene Artist, MD;  Location: [place];  Service: Gastroenterology;  Laterality: N/A;   HAND SURGERY Bilateral    "plastic knuckles"   IR ANGIO INTRA EXTRACRAN SEL COM CAROTID INNOMINATE UNI R MOD SED  01/03/2023   IR ANGIO VERTEBRAL SEL SUBCLAVIAN INNOMINATE UNI L MOD SED  01/03/2023   IR ANGIO  VERTEBRAL SEL VERTEBRAL UNI R MOD SED  01/03/2023   IR INTRAVSC STENT CERV CAROTID W/EMB-PROT MOD SED INCL ANGIO  01/03/2023   IR US GUIDE VASC ACCESS RIGHT  01/03/2023   LAPAROSCOPIC CHOLECYSTECTOMY  01/2006   MELANOMA EXCISION     "back; left arm"   NASAL SEPTUM SURGERY     PELVIC FRACTURE SURGERY  1964   "busted in 2 places"   RADIOLOGY WITH ANESTHESIA N/A 01/03/2023   Procedure: IR WITH ANESTHESIA;  Surgeon: Radiologist, Medication, MD;  Location: [place];  Service: Radiology;  Laterality: N/A;   RESECTION DISTAL CLAVICAL Right 05/13/2016   Procedure: DISTAL CLAVICLE EXCISION;  Surgeon: Ninetta Lights, MD;  Location: [place];  Service: Orthopedics;  Laterality: Right;   SHOULDER ARTHROSCOPY WITH ROTATOR CUFF REPAIR AND SUBACROMIAL DECOMPRESSION Right 05/13/2016   Procedure: RIGHT SHOULDER SCOPE DEBRIDEMENT, ACROMIOPLASTY, ROTATOR CUFF REPAIR; RELEASE BICEPS TENDON AND DEBRIDEMENT LABRUM;  Surgeon: Ninetta Lights, MD;  Location: [place];  Service: Orthopedics;  Laterality: Right;   TIBIA FRACTURE SURGERY Left 2006   "put a steel rod in it"    Assessment & Plan Clinical Impression: Marqual L. 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 [name] at [facility]), readmission [facility] 11/17-11/28 for AMS, aspiration PNA w/melena and recs for strict NPO. He was readmitted on 12/27/2022 with right wrist drop, hypotension and fever T-101.6 F with AMS. He was noted to be septic with lactic acidosis and was influenza A positive.  MRI brain revealed acute/subacute left frontal lobe and high left parietal  lobe infarct with 60-65% stenosis complex plaque L-ICA at origin, bulky calcified carotid siphon plaque L>R and severe R-VA stenosis. Incidental note made of visible retained food or debris in thoracic esophagus but only mild esophageal dilatation. He was treated with 4 L fluid boluses, Tamiflu and Vanc/Cefepime as well as stress dose steroids. Dr. Leonie Man felt that "stroke was embolic likely from cerebral hypoperfusion from sepsis with tandem stenosis of Left proximal and left cavernous carotid artery in setting of sepsis".  Carotid dopplers showed left 40-59% ICA stenosis. 2D echo showed EF 60-65% with no wall abnormality, moderate mitral and mild aortic calcification. He had recurrent fever up to 102 on 01/04 and angiogram/stenting postponed. Blood cultures X 2 negative. He had worsening of right sided weakness with increase in aphasia 01/05 with hypotension and CT/CTA head  negative for bleed and no LVO. He was treated with fluid bolus and MRI brain repeated 01/06 due to ongoing symptoms showing multiple known left cerebral infarcts without new infarct or extension.    Bedside swallow was repeated on 01/06 with trial of liquids as patient reported being on full liquid diet PTA. NPO recommended due to chronic dysphagia and concerns of aspiration.  He underwent cerebral angio with left carotid stent by dr. Romie Jumper on 01/08 with recs for BP 100-140 range, ASA/Brilinta and recs for follow up with ultrasound in 3 months. He is divorced but was staying with ex-wife since discharge from SNF a couple of weeks PTA. Wife plans on providing 24/7 care after discharge.Marland Kitchen He continues to be limited by RUE weakness and decrease in right foot clearance, fatigue requiring multiple rest breaks, flexed posture, ulnar deviation with contractures bilateral hand due to RA, oropharyngeal/esophageadysphagia, cognitive deficits with decreased recall as well as poor awareness of deficits.  Patient transferred to CIR on  01/06/2023 .    Patient currently requires max with basic self-care skills secondary to muscle weakness, decreased cardiorespiratoy endurance, abnormal tone, decreased coordination, and decreased motor planning, decreased attention to right, decreased initiation, decreased attention, decreased problem solving, decreased memory, and delayed processing, and decreased standing balance, decreased postural control, hemiplegia, and decreased balance strategies.  Prior to hospitalization, patient could complete ADL with independent .  Patient will benefit from skilled intervention to decrease level of assist with basic self-care skills and increase independence with basic self-care skills prior to discharge home with care partner.  Anticipate patient will require intermittent supervision and follow up home health.  OT - End of Session Activity Tolerance: Decreased this session Endurance Deficit: Yes Endurance Deficit Description: orthostatic, symptomatic OT Assessment Rehab Potential (ACUTE ONLY): Good OT Patient demonstrates impairments in the following area(s): Balance;Motor;Skin Integrity;Cognition;Pain;Edema;Perception;Endurance;Safety OT Basic ADL's Functional Problem(s): Grooming;Bathing;Dressing;Toileting OT Transfers Functional Problem(s): Tub/Shower OT Additional Impairment(s): Fuctional Use of Upper Extremity OT Plan OT Intensity: Minimum of 1-2 x/day, 45 to 90 minutes OT Frequency: 5 out of 7 days OT Duration/Estimated Length of Stay: 10-12 days OT Treatment/Interventions: Balance/vestibular training;Disease mangement/prevention;Neuromuscular re-education;Patient/family education;Self Care/advanced ADL retraining;Splinting/orthotics;Therapeutic Exercise;UE/LE Coordination activities;Cognitive remediation/compensation;Discharge planning;DME/adaptive equipment instruction;Functional mobility training;Pain management;Psychosocial support;Therapeutic Activities;UE/LE Strength  taining/ROM;Visual/perceptual remediation/compensation OT Self Feeding Anticipated Outcome(s): Patient will be able to  direct care regarding JG tube feeding OT Basic Self-Care Anticipated Outcome(s): Mod I OT Toileting Anticipated Outcome(s): Mod I OT Bathroom Transfers Anticipated Outcome(s): Mod I OT Recommendation Recommendations for Other Services: Neuropsych consult Patient destination: Home Follow Up Recommendations: Home health OT Equipment Recommended: To be determined   OT Evaluation Precautions/Restrictions  Precautions Precautions: Fall Precaution Comments: JG tube, NPO, hypotension Restrictions Weight Bearing Restrictions: No General PT Missed Treatment Reason: Patient fatigue;Patient ill (Comment) (hypotension) Vital Signs Therapy Vitals Temp: 97.7 F (36.5 C) Temp Source: Oral Pulse Rate: 62 Resp: 17 BP: 104/67 Patient Position (if appropriate): Lying Oxygen Therapy SpO2: 98 % O2 Device: Room Air Pain   Home Living/Prior Functioning Home Living Family/patient expects to be discharged to:: Private residence Living Arrangements: Other (Comment) (ex-wife-[name]) Available Help at Discharge: Available 24 hours/day Type of Home: House Home Access: Stairs to enter [employer] of Steps: 2 Entrance Stairs-Rails:  (HAS RAIL BUT UNSURE) Home Layout: One level Bathroom Shower/Tub: Multimedia programmer: Programmer, systems: Yes  Lives With: Alone IADL History Homemaking Responsibilities: No Current License: Yes Occupation: Retired 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 Prior Function Level of Independence: Independent with basic ADLs, Independent with gait, Independent with transfers  Able to Take Stairs?: Yes Driving: Yes Vocation: Retired Surveyor, mining Baseline Vision/History: 1 Wears glasses Ability to See in Adequate Light: 0 Adequate Patient Visual Report: No change from baseline Vision Assessment?:  No apparent visual deficits Perception  Perception: Impaired Inattention/Neglect: Does not attend to right side of body Praxis Praxis: Intact Cognition Cognition Overall Cognitive Status: Impaired/Different from baseline Arousal/Alertness: Awake/alert Memory: Impaired Memory Impairment: Retrieval deficit Attention: Selective;Alternating Focused Attention: Appears intact Sustained Attention: Appears intact Sustained Attention Impairment: Functional basic Selective Attention: Impaired Selective Attention Impairment: Functional basic Awareness: Impaired Awareness Impairment: Emergent impairment Problem Solving: Impaired Problem Solving Impairment: Functional basic Executive Function: Organizing;Initiating Organizing: Impaired Organizing Impairment: Functional basic Initiating: Impaired Initiating Impairment: Functional basic Behaviors: Other (comment) Safety/Judgment: Impaired Comments: will continue to monitor in functional context Brief Interview for Mental Status (BIMS) Repetition of Three Words (First Attempt): 3 Temporal Orientation: Year: Correct Temporal Orientation: Month: Accurate within 5 days Temporal Orientation: Day: Incorrect Recall: "Sock": Yes, no cue required Recall: "Blue": Yes, no cue required Recall: "Bed": Yes, no cue required BIMS Summary Score: 14 Sensation Sensation Light Touch: Appears Intact Hot/Cold: Appears Intact Proprioception: Appears Intact Stereognosis: Not tested Coordination Gross Motor Movements are Fluid and Coordinated: No Fine Motor Movements are Fluid and Coordinated: No Coordination and Movement Description: Generalized weakness and deconditioning Motor  Motor Motor: Abnormal postural alignment and control;Hemiplegia;Abnormal tone Motor - Skilled Clinical Observations: Weakness RUE more pronounced then LUE  Trunk/Postural Assessment  Cervical Assessment Cervical Assessment: Exceptions to [facility] (Forward head sitting and  supine) Thoracic Assessment Thoracic Assessment: Exceptions to [facility] (flexed) Lumbar Assessment Lumbar Assessment: Exceptions to [facility] (posterior rotation of pelvis) Postural Control Postural Control: Deficits on evaluation Trunk Control: posterior bias  Balance Balance Balance Assessed: Yes Static Sitting Balance Static Sitting - Balance Support: Feet supported;No upper extremity supported Static Sitting - Level of Assistance: 5: Stand by assistance Dynamic Sitting Balance Dynamic Sitting - Balance Support: Feet supported;No upper extremity supported Dynamic Sitting - Level of Assistance: 5: Stand by assistance Dynamic Sitting - Balance Activities: Lateral lean/weight shifting;Forward lean/weight shifting Static Standing Balance Static Standing - Balance Support: No upper extremity supported;During functional activity Static Standing - Level of Assistance: 4: Min assist Dynamic Standing Balance Dynamic Standing - Balance Support: No upper extremity  supported;During functional activity Dynamic Standing - Level of Assistance: 3: Mod assist Dynamic Standing - Balance Activities: Lateral lean/weight shifting;Forward lean/weight shifting Extremity/Trunk Assessment RUE Assessment RUE Assessment: Exceptions to [facility] Active Range of Motion (AROM) Comments: ~120* shoulder flexion - thoracic flexion, forward head General Strength Comments: 4-/5 LUE Assessment LUE Assessment: Exceptions to [facility] Active Range of Motion (AROM) Comments: ~80* Shoulder flexion - cannot sustain - limited by pain in wrist General Strength Comments: NT LUE Body System: Neuro Brunstrum levels for arm and hand: Arm;Hand Brunstrum level for arm: Stage IV Movement is deviating from synergy Brunstrum level for hand: Stage III Synergies performed voluntarily  Care Tool Care Tool Self Care Eating Eating activity did not occur: Safety/medical concerns Eating Assist Level: Dependent - Patient 0% (NPO - JG tube)    Oral  Care    Oral Care Assist Level: Moderate Assistance - Patient 50 - 74%    Bathing   Body parts bathed by patient: Right arm;Chest;Abdomen;Right upper leg;Left upper leg;Face Body parts bathed by helper: Left arm;Left lower leg;Right lower leg;Front perineal area;Buttocks   Assist Level: Moderate Assistance - Patient 50 - 74%    Upper Body Dressing(including orthotics)   What is the patient wearing?: Pull over shirt   Assist Level: Moderate Assistance - Patient 50 - 74%    Lower Body Dressing (excluding footwear)   What is the patient wearing?: Incontinence brief;Pants Assist for lower body dressing: Maximal Assistance - Patient 25 - 49%    Putting on/Taking off footwear   What is the patient wearing?: Non-skid slipper socks Assist for footwear: Dependent - Patient 0%       Care Tool Toileting Toileting activity   Assist for toileting: Maximal Assistance - Patient 25 - 49%     Care Tool Bed Mobility Roll left and right activity   Roll left and right assist level: Minimal Assistance - Patient > 75%    Sit to lying activity   Sit to lying assist level: Minimal Assistance - Patient > 75%    Lying to sitting on side of bed activity   Lying to sitting on side of bed assist level: the ability to move from lying on the back to sitting on the side of the bed with no back support.: Minimal Assistance - Patient > 75%     Care Tool Transfers Sit to stand transfer   Sit to stand assist level: Minimal Assistance - Patient > 75%    Chair/bed transfer Chair/bed transfer activity did not occur: Safety/medical concerns Chair/bed transfer assist level: Minimal Assistance - Patient > 75%     Toilet transfer   Assist Level: Moderate Assistance - Patient 50 - 74%     Care Tool Cognition  Expression of Ideas and Wants Expression of Ideas and Wants: 3. Some difficulty - exhibits some difficulty with expressing needs and ideas (e.g, some words or finishing thoughts) or speech is not clear   Understanding Verbal and Non-Verbal Content Understanding Verbal and Non-Verbal Content: 3. Usually understands - understands most conversations, but misses some part/intent of message. Requires cues at times to understand   Memory/Recall Ability Memory/Recall Ability : Current season;That he or she is in a hospital/hospital unit   Refer to Care Plan for [place] 1 OT Short Term Goal 1 (Week 1): Patient will complete shower trasnfer with min assist OT Short Term Goal 2 (Week 1): Patient will shower with min assist OT Short Term Goal 3 (Week 1):  Patient will dress upper body with min assist OT Short Term Goal 4 (Week 1): Patient will demonstrate 30* of active wrist extension without increase in pain symptoms  Recommendations for other services: Neuropsych   Skilled Therapeutic Intervention Patient received supine in bed - awake and alert.  Agreeable to get out of bed for OT Eval. Patient orthostatic - during session - teds applied and patient returned to bed at end of session.  ADL status below.  Patient left in bed with bed alarm engaged and call bell/ personal items in reach.   ADL ADL Eating: NPO Grooming: Moderate assistance Where Assessed-Grooming: Sitting at sink Upper Body Bathing: Moderate assistance Where Assessed-Upper Body Bathing: Sitting at sink Lower Body Bathing: Maximal assistance Where Assessed-Lower Body Bathing: Sitting at sink;Standing at sink Upper Body Dressing: Moderate assistance Where Assessed-Upper Body Dressing: Sitting at sink Lower Body Dressing: Maximal assistance Where Assessed-Lower Body Dressing: Sitting at sink;Standing at sink Toileting: Maximal assistance Where Assessed-Toileting: Glass blower/designer: Moderate assistance Toilet Transfer Method: Stand pivot Toilet Transfer Equipment: Grab bars Tub/Shower Transfer: Unable to assess Tub/Shower Transfer Method: Unable to assess Social research officer, government: Unable to  assess [employer] Method: Unable to assess ADL Comments: Patient extremely fatigued with low BP Mobility  Bed Mobility Bed Mobility: Rolling Right;Supine to Sit;Sit to Supine Rolling Right: Minimal Assistance - Patient > 75% Rolling Left: Supervision/Verbal cueing Supine to Sit: Minimal Assistance - Patient > 75% Sit to Supine: Minimal Assistance - Patient > 75% Transfers Sit to Stand: Minimal Assistance - Patient > 75% Stand to Sit: Minimal Assistance - Patient > 75%   Discharge Criteria: Patient will be discharged from OT if patient refuses treatment 3 consecutive times without medical reason, if treatment goals not met, if there is a change in medical status, if patient makes no progress towards goals or if patient is discharged from hospital.  The above assessment, treatment plan, treatment alternatives and goals were discussed and mutually agreed upon: by patient  Mariah Milling 01/07/2023, 1:46 PM

## 2023-01-07 NOTE — Progress Notes (Signed)
Bilateral lower extremity venous duplex has been completed. Preliminary results can be found in CV Proc through chart review.   01/07/23 1:36 PM [name] RVT

## 2023-01-07 NOTE — Progress Notes (Signed)
PROGRESS NOTE   Subjective/Complaints: Asks me to help him find his phone Feels fatigued from therapy Has no other complaints Placed RD order for TF management  Objective:   VAS [place] LOWER EXTREMITY VENOUS (DVT)  Result Date: 01/07/2023  Lower Venous DVT Study Patient Name:  [name]  Date of Exam:   01/07/2023 Medical Rec #: [id number]           Accession #:    [id number] Date of Birth: Jun 20, 1946          Patient Gender: M Patient Age:   77 years Exam Location:  [facility] Procedure:      VAS [place] LOWER EXTREMITY VENOUS (DVT) Referring Phys: PAMELA LOVE --------------------------------------------------------------------------------  Indications: Stroke.  Risk Factors: None identified. Comparison Study: No prior studies. Performing Technologist: Oliver Hum RVT  Examination Guidelines: A complete evaluation includes B-mode imaging, spectral Doppler, color Doppler, and power Doppler as needed of all accessible portions of each vessel. Bilateral testing is considered an integral part of a complete examination. Limited examinations for reoccurring indications may be performed as noted. The reflux portion of the exam is performed with the patient in reverse Trendelenburg.  +---------+---------------+---------+-----------+----------+--------------+ RIGHT    CompressibilityPhasicitySpontaneityPropertiesThrombus Aging +---------+---------------+---------+-----------+----------+--------------+ CFV      Full           Yes      Yes                                 +---------+---------------+---------+-----------+----------+--------------+ SFJ      Full                                                        +---------+---------------+---------+-----------+----------+--------------+ FV Prox  Full                                                         +---------+---------------+---------+-----------+----------+--------------+ FV Mid   Full                                                        +---------+---------------+---------+-----------+----------+--------------+ FV DistalFull                                                        +---------+---------------+---------+-----------+----------+--------------+ PFV      Full                                                        +---------+---------------+---------+-----------+----------+--------------+  POP      Full           Yes      Yes                                 +---------+---------------+---------+-----------+----------+--------------+ PTV      Full                                                        +---------+---------------+---------+-----------+----------+--------------+ PERO     Full                                                        +---------+---------------+---------+-----------+----------+--------------+   +---------+---------------+---------+-----------+----------+--------------+ LEFT     CompressibilityPhasicitySpontaneityPropertiesThrombus Aging +---------+---------------+---------+-----------+----------+--------------+ CFV      Full           Yes      Yes                                 +---------+---------------+---------+-----------+----------+--------------+ SFJ      Full                                                        +---------+---------------+---------+-----------+----------+--------------+ FV Prox  Full                                                        +---------+---------------+---------+-----------+----------+--------------+ FV Mid   Full                                                        +---------+---------------+---------+-----------+----------+--------------+ FV DistalFull                                                         +---------+---------------+---------+-----------+----------+--------------+ PFV      Full                                                        +---------+---------------+---------+-----------+----------+--------------+ POP      Full           Yes      Yes                                 +---------+---------------+---------+-----------+----------+--------------+  PTV      Full                                                        +---------+---------------+---------+-----------+----------+--------------+ PERO     Full                                                        +---------+---------------+---------+-----------+----------+--------------+     Summary: RIGHT: - There is no evidence of deep vein thrombosis in the lower extremity.  - No cystic structure found in the popliteal fossa.  LEFT: - There is no evidence of deep vein thrombosis in the lower extremity.  - No cystic structure found in the popliteal fossa.  *See table(s) above for measurements and observations. Electronically signed by Monica Martinez MD on 01/07/2023 at 2:05:48 PM.    Final    DG Wrist 2 Views Right  Result Date: 01/07/2023 CLINICAL DATA:  Pain post stroke, possible fall EXAM: RIGHT WRIST - 2 VIEW COMPARISON:  None Available. FINDINGS: Carpal rows intact. Negative for fracture. Mild DJD at the first carpometacarpal articulation with small spurs. Normal alignment and mineralization. Regional soft tissues unremarkable. IMPRESSION: No acute findings. Mild first CMC DJD. Electronically Signed   By: Lucrezia Europe M.D.   On: 01/07/2023 10:02   DG Chest 2 View  Result Date: 01/06/2023 CLINICAL DATA:  Aspiration EXAM: CHEST - 2 VIEW COMPARISON:  Chest x-ray dated January 01, 2023 FINDINGS: Cardiac and mediastinal contours are within normal limits. Elevation of the right hemidiaphragm. Decreased left-greater-than-right basilar opacities. Evidence of pneumothorax. IMPRESSION: Decreased left-greater-than-right  basilar opacities, likely resolving aspiration. Recommend follow-up chest x-ray in 3-4 weeks to ensure complete resolution. Electronically Signed   By: Yetta Glassman M.D.   On: 01/06/2023 17:51   Recent Labs    01/05/23 0358 01/07/23 0608  WBC 6.9 9.0  HGB 8.5* 9.0*  HCT 25.8* 27.8*  PLT 142* 232   Recent Labs    01/05/23 0358 01/07/23 0608  NA 136 136  K 4.0 3.5  CL 106 102  CO2 21* 23  GLUCOSE 257* 100*  BUN 26* 20  CREATININE 0.67 0.71  CALCIUM 8.4* 8.4*    Intake/Output Summary (Last 24 hours) at 01/07/2023 1616 Last data filed at 01/07/2023 1442 Gross per 24 hour  Intake 0 ml  Output 750 ml  Net -750 ml        Physical Exam: Vital Signs Blood pressure 104/67, pulse 62, temperature 97.7 F (36.5 C), temperature source Oral, resp. rate 17, height '6\' 2"'$  (1.88 m), weight 72.1 kg, SpO2 98 %. Constitutional:      Appearance: BMI 20.41. He is ill-appearing.     Comments: Sitting in bed with multiple cups at bedside--one with ice chips, one w/water and 2 w/ frothy yellow expectorated sputum.   Cardiac: RRR. No murmurs, rubs, or gallops. + RUE edema, mild.  Resp: CTAB. On RA. Thick yellow sputum in bedside cup.  Abdominal:     Comments: + PEG tube with dark crusty material and loose bolster.   Genitourinary:    Comments: Rectal tube in place with liquid stool (patient unaware of it). Male purewick  in place with clear urine output.  Skin:    General: Skin is warm and dry. +scarring on neck from prior trach, healed. +2X R forearm Ivs.  MSK: Severe ulnar deviation of fingers in L hand d/t RA; less pronounced on R hand.    Neurological:     Mental Status: He is alert.     Comments: Oriented to self, place, month,age, DOB and date as "the 10". Not oriented to event;  thought he was here for bleeding or passing out. He was unable to recall recent medical issues or names of his doctors except for PCP. He was able to follow simple motor commands.    DTRs: Reflexes were 1+  in RUE and 3+ in RLE; 2+ in LUE and LLE. No clonus on ankle jerk.  Babinsky: flexor responses b/l.   Hoffmans: negative b/l Sensory exam: revealed normal sensation in all dermatomal regions in bilateral UE and LEs. Motor exam: strength normal in all myotomal regions of LUE and LLE. RUE 3/5 shoulder abduction, 4-/5 elbow flexion, 3+/5 elbow extension, 2+/5 wrist extension, 1-/5 finger abduction, 2+/5 grip RLE 4-/5 hip flexion, 5-/5 knee extension, 5/5 ankle DF, PF.  Assessment/Plan: 1. Functional deficits which require 3+ hours per day of interdisciplinary therapy in a comprehensive inpatient rehab setting. Physiatrist is providing close team supervision and 24 hour management of active medical problems listed below. Physiatrist and rehab team continue to assess barriers to discharge/monitor patient progress toward functional and medical goals  Care Tool:  Bathing    Body parts bathed by patient: Right arm, Chest, Abdomen, Right upper leg, Left upper leg, Face   Body parts bathed by helper: Left arm, Left lower leg, Right lower leg, Front perineal area, Buttocks     Bathing assist Assist Level: Moderate Assistance - Patient 50 - 74%     Upper Body Dressing/Undressing Upper body dressing   What is the patient wearing?: Pull over shirt    Upper body assist Assist Level: Moderate Assistance - Patient 50 - 74%    Lower Body Dressing/Undressing Lower body dressing      What is the patient wearing?: Incontinence brief, Pants     Lower body assist Assist for lower body dressing: Maximal Assistance - Patient 25 - 49%     Toileting Toileting    Toileting assist Assist for toileting: Maximal Assistance - Patient 25 - 49%     Transfers Chair/bed transfer  Transfers assist  Chair/bed transfer activity did not occur: Safety/medical concerns  Chair/bed transfer assist level: Minimal Assistance - Patient > 75%     Locomotion Ambulation   Ambulation assist   Ambulation  activity did not occur: Safety/medical concerns          Walk 10 feet activity   Assist  Walk 10 feet activity did not occur: Safety/medical concerns        Walk 50 feet activity   Assist Walk 50 feet with 2 turns activity did not occur: Safety/medical concerns         Walk 150 feet activity   Assist Walk 150 feet activity did not occur: Safety/medical concerns         Walk 10 feet on uneven surface  activity   Assist Walk 10 feet on uneven surfaces activity did not occur: Safety/medical concerns         Wheelchair     Assist Is the patient using a wheelchair?: Yes Type of Wheelchair: Manual Wheelchair activity did not occur: Safety/medical concerns  Wheelchair 50 feet with 2 turns activity    Assist    Wheelchair 50 feet with 2 turns activity did not occur: Safety/medical concerns       Wheelchair 150 feet activity     Assist  Wheelchair 150 feet activity did not occur: Safety/medical concerns       Blood pressure 104/67, pulse 62, temperature 97.7 F (36.5 C), temperature source Oral, resp. rate 17, height '6\' 2"'$  (1.88 m), weight 72.1 kg, SpO2 98 %.    Medical Problem List and Plan: 1. 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  Antithrombotics: -DVT/anticoagulation:  Mechanical: Sequential compression devices, below knee Bilateral lower extremities due to recurrent GIB             -antiplatelet therapy:  Brilinta and ASA 3. 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 Mood/Behavior/Sleep: LCSW to follow for evaluation and support.              -antipsychotic agents: N/A 5. Neuropsych/cognition: This patient is not fully capable of making decisions on his own behalf. 6. Skin/Wound Care: Routine pressure relief. Continue tube feeds             --Continue Male pure wick and rectal tube tonight.  --Question patient  awareness/continence.   --Will d/c in am and start toileting program.  7. Fluids/Electrolytes/Nutrition: Continue NPO with tube feeds.              --he reports that he was cleared for ice by [facility] Will reach out to confirm; NPO in interim given high aspiration risk. --On continuous tube feed-->will change CBGs to every 4 hours. .  8. Left carotid stent: Continue ASA/Brilinta.  9. 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 Will recheck in am. 10. 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 Resume PPI.  --Monitor H/H with serial checks. Has dropped from  10.3-->8.5 --Stool brown. Monitor for signs of bleeding.  11. Thrombocytopenia:Continue to monitor as have ranged187-->94-->142.             --monitor for signs of bleeding.  12. Aspiration PNA/Pneumonitis: On stress dose steriods D# 4/5.              -- 1/11 CXR 2 view w/ongoing frothy cough -> Decreased left-greater-than-right basilar opacities, likely resolving aspiration. Recommend follow-up chest x-ray in 3-4 weeks to ensure complete resolution.             --encourage flutter valve every 4 hrs WA.   13. T2DM: Hgb A1c-6.2. Monitor CBGs every 4 hours and use SSI for elevated BS             --on Prednisone to taper off in 24 hours. Monitor for improvement off this.       Recent Labs    01/06/23 0344 01/06/23 0808 01/06/23 1145  GLUCAP 169* 225* 116*   CBGs >200: d/c feeding supplement   14.Influenza A: Completed Tamiflu X 5 days 15. RA: Was on monthly Orenzia w/ prednisone to 1 mg daily as well as methotrexate and tadalafil. CXR reviewed and shows resolving infiltrate.              --taper to home dose 01/13.  16. Code status: continue  Full code per palliative care notes 12/15/22.     LOS: 1 days A FACE TO FACE EVALUATION WAS PERFORMED  Clide Deutscher Leng Montesdeoca 01/07/2023, 4:16 PM

## 2023-01-07 NOTE — Progress Notes (Signed)
Inpatient Rehabilitation  Patient information reviewed and entered into eRehab system by Tayelor Osborne Carlon Davidson, OTR/L, Rehab Quality Coordinator.   Information including medical coding, functional ability and quality indicators will be reviewed and updated through discharge.   

## 2023-01-07 NOTE — Evaluation (Signed)
Speech Language Pathology Assessment and Plan  Patient Details  Name: [name] MRN: [id number] Date of Birth: 03-20-1946  SLP Diagnosis: Cognitive Impairments;Dysphagia  Rehab Potential: Good ELOS: 10-12 days    Today's Date: 01/07/2023 SLP Individual Time: 0998-3382 SLP Individual Time Calculation (min): 82 min   Hospital Problem: Principal Problem:   Acute ischemic left middle cerebral artery (MCA) stroke ([place])  Past Medical History:  Past Medical History:  Diagnosis Date   Aortic aneurysm ([place]) 05/2017   Chronic back pain    Diabetes mellitus without complication (HCC)    Dyslipidemia    GERD (gastroesophageal reflux disease)    H/O hiatal hernia    Hypertension    Melanoma ([place])    "back; left arm"   PONV (postoperative nausea and vomiting)    Rheumatoid arthritis ([place])    Spasm of esophagus    Past Surgical History:  Past Surgical History:  Procedure Laterality Date   aneursym repair     aneursym repair and stent placement  05/2017   BIOPSY  08/19/2022   Procedure: BIOPSY;  Surgeon: Ladene Artist, MD;  Location: [facility] ENDOSCOPY;  Service: Gastroenterology;;   CARPAL TUNNEL RELEASE Bilateral    COLON SURGERY  2007   removed 18" of colon   ERCP W/ METAL STENT PLACEMENT  11/2005   Archie Endo 12/14/2005   ESOPHAGOGASTRODUODENOSCOPY (EGD) WITH ESOPHAGEAL DILATION     "several times'   ESOPHAGOGASTRODUODENOSCOPY (EGD) WITH PROPOFOL N/A 08/19/2022   Procedure: ESOPHAGOGASTRODUODENOSCOPY (EGD) WITH PROPOFOL;  Surgeon: Ladene Artist, MD;  Location: [place];  Service: Gastroenterology;  Laterality: N/A;   HAND SURGERY Bilateral    "plastic knuckles"   IR ANGIO INTRA EXTRACRAN SEL COM CAROTID INNOMINATE UNI R MOD SED  01/03/2023   IR ANGIO VERTEBRAL SEL SUBCLAVIAN INNOMINATE UNI L MOD SED  01/03/2023   IR ANGIO VERTEBRAL SEL VERTEBRAL UNI R MOD SED  01/03/2023   IR INTRAVSC STENT CERV CAROTID W/EMB-PROT MOD SED INCL ANGIO  01/03/2023   IR US GUIDE VASC ACCESS  RIGHT  01/03/2023   LAPAROSCOPIC CHOLECYSTECTOMY  01/2006   MELANOMA EXCISION     "back; left arm"   NASAL SEPTUM SURGERY     PELVIC FRACTURE SURGERY  1964   "busted in 2 places"   RADIOLOGY WITH ANESTHESIA N/A 01/03/2023   Procedure: IR WITH ANESTHESIA;  Surgeon: Radiologist, Medication, MD;  Location: [place];  Service: Radiology;  Laterality: N/A;   RESECTION DISTAL CLAVICAL Right 05/13/2016   Procedure: DISTAL CLAVICLE EXCISION;  Surgeon: Ninetta Lights, MD;  Location: [place];  Service: Orthopedics;  Laterality: Right;   SHOULDER ARTHROSCOPY WITH ROTATOR CUFF REPAIR AND SUBACROMIAL DECOMPRESSION Right 05/13/2016   Procedure: RIGHT SHOULDER SCOPE DEBRIDEMENT, ACROMIOPLASTY, ROTATOR CUFF REPAIR; RELEASE BICEPS TENDON AND DEBRIDEMENT LABRUM;  Surgeon: Ninetta Lights, MD;  Location: [place];  Service: Orthopedics;  Laterality: Right;   TIBIA FRACTURE SURGERY Left 2006   "put a steel rod in it"    Assessment / Plan / Recommendation Clinical Impression Patient is a 77 year old male with history of PE/DVT, interstitial lung disease, RA, 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 [name] at [facility]), readmission [facility] 11/17-11/28 for AMS, aspiration PNA w/melena and recs for strict NPO. He was  readmitted on 12/27/2022 with right wrist drop, hypotension and fever T-101.6 F with AMS. He was noted to be septic with lactic acidosis and was influenza A positive.  MRI brain revealed acute/subacute left frontal lobe and high left parietal lobe infarct with 60-65% stenosis complex plaque L-ICA at origin, bulky calcified carotid siphon plaque L>R and severe R-VA stenosis. Incidental  note made of visible retained food or debris in thoracic esophagus but only mild esophageal dilatation. He was treated with 4 L fluid boluses, Tamiflu and Vanc/Cefepime as well as stress dose steroids. Dr. Leonie Man felt that "stroke was embolic likely from cerebral hypoperfusion from sepsis with tandem stenosis of Left proximal and left cavernous carotid artery in setting of sepsis".  Carotid dopplers showed left 40-59% ICA stenosis. 2D echo showed EF 60-65% with no wall abnormality, moderate mitral and mild aortic calcification. He had recurrent fever up to 102 on 01/04 and angiogram/stenting postponed. Blood cultures X 2 negative. He had worsening of right sided weakness with increase in aphasia 01/05 with hypotension and CT/CTA head  negative for bleed and no LVO. He was treated with fluid bolus and MRI brain repeated 01/06 due to ongoing symptoms showing multiple known left cerebral infarcts without new infarct or extension. Bedside swallow was repeated on 01/06 with trial of liquids as patient reported being on full liquid diet PTA. NPO recommended due to chronic dysphagia and concerns of aspiration.  He underwent cerebral angio with left carotid stent by dr. Romie Jumper on 01/08 with recs for BP 100-140 range, ASA/Brilinta and recs for follow up with ultrasound in 3 months. He continues to be limited by RUE weakness and decrease in right foot clearance, fatigue requiring multiple rest breaks, flexed posture, ulnar deviation with contractures bilateral hand due to RA, oropharyngeal/esophageadysphagia, cognitive deficits with decreased recall as well as poor awareness of deficits. CIR recommended due to functional decline. Patient admitted 01/06/23.  Upon arrival, patient was awake while semi-reclined in bed and agreeable to SLP evaluation.  Patient was pleasant and cooperative throughout but a poor historian regarding his complex medical history.  Patient has an extensive and complex history of both  chronic oropharyngeal and esophageal dysphagia, for which he receives most of his care at [facility]. Per latest note from [place] (10/2002), patient is to remain a "strict NPO" due to multiple reoccurring episodes of aspiration PNA as patient was consuming thin liquids (no solids).  However, per patient's report, he had stopped any PO intake "months ago," using only his PEG.    Due to above, trials were not observed. Patient's OME was [facility] and patient demonstrated a persistent congested cough with ability to orally expectorate large amounts of phlegm. Hiccups were also intermittently noted with intermittent decreased ability to manage secretions as he would orally expectorate moderate amounts of saliva. SLP set-up suction and provided education regarding the importance of oral hygiene to minimize bacterial load. Patient verbalized understanding but will need reinforcement.  Recommend patient remain NPO.  SLP administered the Cognistat with patient demonstrating mild impairments in calculations and moderate impairments in attention and short-term recall. Patient was verbose with intermittent verbal cues needed for redirection. Patient's overall auditory comprehension and verbal expression appeared [facility] for tasks assessed with one phonemic paraphasia noted.    Patient would benefit from skilled SLP intervention to maximize his cognitive function and overall functional independence prior to discharge.    Skilled Therapeutic Interventions          Administered a BSE and cognitive-linguistic evaluation, please  see above for details.   SLP Assessment  Patient will need skilled Speech Lanaguage Pathology Services during CIR admission    Recommendations  SLP Diet Recommendations: NPO Medication Administration: Via alternative means Oral Care Recommendations: Oral care QID Patient destination: Home Follow up Recommendations: Outpatient SLP;Home Health SLP;24 hour supervision/assistance Equipment Recommended: None  recommended by SLP    SLP Frequency 3 to 5 out of 7 days   SLP Duration  SLP Intensity  SLP Treatment/Interventions 10-12 days  Minumum of 1-2 x/day, 30 to 90 minutes  Cognitive remediation/compensation;Dysphagia/aspiration precaution training;Internal/external aids;Speech/Language facilitation;Therapeutic Activities;Environmental controls;Cueing hierarchy;Patient/family education;Functional tasks    Pain No/Denies Pain  SLP Evaluation Cognition Overall Cognitive Status: Impaired/Different from baseline Arousal/Alertness: Awake/alert Orientation Level: Oriented X4 Attention: Selective;Alternating Focused Attention: Appears intact Sustained Attention: Appears intact Sustained Attention Impairment: Functional basic Selective Attention: Impaired Selective Attention Impairment: Functional basic Memory: Impaired Memory Impairment: Decreased recall of new information;Decreased short term memory Awareness: Impaired Awareness Impairment: Emergent impairment Problem Solving: Impaired Problem Solving Impairment: Functional basic Executive Function: Organizing;Initiating Organizing: Impaired Organizing Impairment: Functional basic Initiating: Impaired Initiating Impairment: Functional basic Behaviors: Other (comment) Safety/Judgment: Impaired Comments: will continue to monitor in functional context  Comprehension Auditory Comprehension Overall Auditory Comprehension: Impaired Commands: Impaired Interfering Components: Attention Expression Expression Primary Mode of Expression: Verbal Verbal Expression Overall Verbal Expression: Appears within functional limits for tasks assessed Written Expression Dominant Hand: Right Written Expression: Not tested Oral Motor Oral Motor/Sensory Function Overall Oral Motor/Sensory Function: Generalized oral weakness Motor Speech Overall Motor Speech: Appears within functional limits for tasks assessed  Care Tool Care Tool  Cognition Ability to hear (with hearing aid or hearing appliances if normally used Ability to hear (with hearing aid or hearing appliances if normally used): 0. Adequate - no difficulty in normal conservation, social interaction, listening to TV   Expression of Ideas and Wants Expression of Ideas and Wants: 3. Some difficulty - exhibits some difficulty with expressing needs and ideas (e.g, some words or finishing thoughts) or speech is not clear   Understanding Verbal and Non-Verbal Content Understanding Verbal and Non-Verbal Content: 3. Usually understands - understands most conversations, but misses some part/intent of message. Requires cues at times to understand  Memory/Recall Ability Memory/Recall Ability : Current season;That he or she is in a hospital/hospital unit   Bedside Swallowing Assessment General Date of Onset: 12/31/22 Previous Swallow Assessment: 12/28/22 indicating need for strict NPO Diet Prior to this Study: NPO Temperature Spikes Noted: No Respiratory Status: Room air History of Recent Intubation: No Behavior/Cognition: Alert;Cooperative;Distractible;Pleasant mood Oral Cavity - Dentition: Missing dentition Vision: Functional for self-feeding Patient Positioning: Upright in bed Baseline Vocal Quality: Normal Volitional Cough: Strong Volitional Swallow: Able to elicit  Ice Chips Ice chips: Not tested Thin Liquid Thin Liquid: Not tested Nectar Thick Nectar Thick Liquid: Not tested Honey Thick Honey Thick Liquid: Not tested Puree Puree: Not tested Solid Solid: Not tested BSE Assessment Suspected Esophageal Findings Suspected Esophageal Findings:  (hiccups) Risk for Aspiration Impact on safety and function: Severe aspiration risk Other Related Risk Factors: History of esophageal-related issues;Deconditioning;History of dysphagia;History of pneumonia;Cognitive impairment;Decreased management of secretions  Short Term Goals: Week 1: SLP Short Term Goal 1 (Week  1): Patient will consistently complete oral care via the suction toothbrush with Min verbal cues for set-up/frequency. SLP Short Term Goal 2 (Week 1): Patient will demonstrate sustained attention to functional tasks for 30 minutes with Min verbal cues for redirection. SLP Short Term Goal 3 (Week 1): Patient will demonstrate functional problem  solving for basic and familiar tasks with Min verbal cues. SLP Short Term Goal 4 (Week 1): Patient will demonstrate recall of functional information with Mod verbal and visual cues.  Refer to Care Plan for Long Term Goals  Recommendations for other services: None   Discharge Criteria: Patient will be discharged from SLP if patient refuses treatment 3 consecutive times without medical reason, if treatment goals not met, if there is a change in medical status, if patient makes no progress towards goals or if patient is discharged from hospital.  The above assessment, treatment plan, treatment alternatives and goals were discussed and mutually agreed upon: by patient  Samiel Peel 01/07/2023, 3:22 PM

## 2023-01-07 NOTE — Plan of Care (Signed)
  Problem: RH Problem Solving Goal: LTG Patient will demonstrate problem solving for (SLP) Description: LTG:  Patient will demonstrate problem solving for basic/complex daily situations with cues  (SLP) Flowsheets (Taken 01/07/2023 1523) LTG: Patient will demonstrate problem solving for (SLP): Basic daily situations LTG Patient will demonstrate problem solving for: Minimal Assistance - Patient > 75%   Problem: RH Memory Goal: LTG Patient will use memory compensatory aids to (SLP) Description: LTG:  Patient will use memory compensatory aids to recall biographical/new, daily complex information with cues (SLP) Flowsheets (Taken 01/07/2023 1523) LTG: Patient will use memory compensatory aids to (SLP): Moderate Assistance - Patient 50 - 74%   Problem: RH Attention Goal: LTG Patient will demonstrate this level of attention during functional activites (SLP) Description: LTG:  Patient will will demonstrate this level of attention during functional activites (SLP) Flowsheets (Taken 01/07/2023 1523) Patient will demonstrate during cognitive/linguistic activities the attention type of: Selective LTG: Patient will demonstrate this level of attention during cognitive/linguistic activities with assistance of (SLP): Minimal Assistance - Patient > 75%

## 2023-01-07 NOTE — Progress Notes (Signed)
Inpatient Rehabilitation Care Coordinator Assessment and Plan Patient Details  Name: [name] MRN: [id number] Date of Birth: [birth year]  Today's Date: 01/07/2023  Hospital Problems: Principal Problem:   Acute ischemic left middle cerebral artery (MCA) stroke [facility])  Past Medical History:  Past Medical History:  Diagnosis Date   Aortic aneurysm ([place]) 05/2017   Chronic back pain    Diabetes mellitus without complication (HCC)    Dyslipidemia    GERD (gastroesophageal reflux disease)    H/O hiatal hernia    Hypertension    Melanoma ([place])    "back; left arm"   PONV (postoperative nausea and vomiting)    Rheumatoid arthritis ([place])    Spasm of esophagus    Past Surgical History:  Past Surgical History:  Procedure Laterality Date   aneursym repair     aneursym repair and stent placement  05/2017   BIOPSY  08/19/2022   Procedure: BIOPSY;  Surgeon: Ladene Artist, MD;  Location: [facility] ENDOSCOPY;  Service: Gastroenterology;;   CARPAL TUNNEL RELEASE Bilateral    COLON SURGERY  2007   removed 18" of colon   ERCP W/ METAL STENT PLACEMENT  11/2005   Archie Endo 12/14/2005   ESOPHAGOGASTRODUODENOSCOPY (EGD) WITH ESOPHAGEAL DILATION     "several times'   ESOPHAGOGASTRODUODENOSCOPY (EGD) WITH PROPOFOL N/A 08/19/2022   Procedure: ESOPHAGOGASTRODUODENOSCOPY (EGD) WITH PROPOFOL;  Surgeon: Ladene Artist, MD;  Location: [place];  Service: Gastroenterology;  Laterality: N/A;   HAND SURGERY Bilateral    "plastic knuckles"   IR ANGIO INTRA EXTRACRAN SEL COM CAROTID INNOMINATE UNI R MOD SED  01/03/2023   IR ANGIO VERTEBRAL SEL SUBCLAVIAN INNOMINATE UNI L MOD SED  01/03/2023   IR ANGIO VERTEBRAL SEL VERTEBRAL UNI R MOD SED  01/03/2023   IR INTRAVSC STENT CERV CAROTID W/EMB-PROT MOD SED INCL ANGIO  01/03/2023   IR US GUIDE VASC ACCESS RIGHT  01/03/2023   LAPAROSCOPIC CHOLECYSTECTOMY  01/2006   MELANOMA EXCISION     "back; left arm"   NASAL SEPTUM SURGERY     PELVIC FRACTURE SURGERY  1964    "busted in 2 places"   RADIOLOGY WITH ANESTHESIA N/A 01/03/2023   Procedure: IR WITH ANESTHESIA;  Surgeon: Radiologist, Medication, MD;  Location: [place];  Service: Radiology;  Laterality: N/A;   RESECTION DISTAL CLAVICAL Right 05/13/2016   Procedure: DISTAL CLAVICLE EXCISION;  Surgeon: Ninetta Lights, MD;  Location: [place];  Service: Orthopedics;  Laterality: Right;   SHOULDER ARTHROSCOPY WITH ROTATOR CUFF REPAIR AND SUBACROMIAL DECOMPRESSION Right 05/13/2016   Procedure: RIGHT SHOULDER SCOPE DEBRIDEMENT, ACROMIOPLASTY, ROTATOR CUFF REPAIR; RELEASE BICEPS TENDON AND DEBRIDEMENT LABRUM;  Surgeon: Ninetta Lights, MD;  Location: [place];  Service: Orthopedics;  Laterality: Right;   TIBIA FRACTURE SURGERY Left 2006   "put a steel rod in it"   Social History:  reports that he quit smoking about 17 years ago. His smoking use included cigarettes. He has a 46.00 pack-year smoking history. He has never used smokeless tobacco. He reports current alcohol use of about 9.0 standard drinks of alcohol per week. He reports that he does not use drugs.  Family / Support Systems Marital Status: Divorced Patient Roles: Parent, Other (Comment) (ex-husband) Children: [name] [phone]  [name] [phone] Other Supports: [name]-ex-wife Anticipated Caregiver: [name] Ability/Limitations of Caregiver: Wants him to be mod/i before coming home or will need to seek alternatives Caregiver Availability: 24/7 (she will be there but not sure how much assist can provide) Family Dynamics: Close  with son and still friends with ex-wife. Has been staying with her since discharged from [facility] 12/22. But was caring for himself and doing own tube feedings, according to son  Social History Preferred language: English Religion: [employer] Cultural Background: No issues Education: HS Health Literacy - How often do you need to have someone help you when you read instructions,  pamphlets, or other written material from your doctor or pharmacy?: Sometimes Writes: Yes Employment Status: Retired Public relations account executive Issues: No issues Guardian/Conservator: None-according to MD pt is not fully capable of making his own decisions at this time. Will look toward his son's since no formal HC-POA in place but [name] reports he is the spokesperson for the family.   Abuse/Neglect Abuse/Neglect Assessment Can Be Completed: Yes Physical Abuse: Denies Verbal Abuse: Denies Sexual Abuse: Denies Exploitation of patient/patient's resources: Denies Self-Neglect: Denies  Patient response to: Social Isolation - How often do you feel lonely or isolated from those around you?: Never  Emotional Status Pt's affect, behavior and adjustment status: Pt is tired and not having a good day did not want to answer questions. Information obtained by son with pt's permission. He was mod/i level and could do tube feedings but ex-wife oversaw it, according to son. Now pt's hand has been affected by CVA and concern is he will not be able to do this task. Recent Psychosocial Issues: pt has multiple health issues and has been in and out of the hospital and in and out of a NH for the past 120 days Psychiatric History: No history would benefit from seeing neuro-psych while here due to long hospitalization and nursing home stay, along with many health issues Substance Abuse History: No issues  Patient / Family Perceptions, Expectations & Goals Pt/Family understanding of illness & functional limitations: Son can explain Dad's health issues and was informed while on acute he was ambulating 100 ft CGA. He expects his Dad to do well and be mod/i hopefully when he leaves. Did have some medical questions and wanted an update have messaged MD to call son and gave her his number to follow up with Premorbid pt/family roles/activities: father, ex-husband, retiree, friend, etc Anticipated changes in  roles/activities/participation: resume Pt/family expectations/goals: Pt states: " I'm having a bad day today." [name] states: " I hope he will do well there and glad he came to you all the skilled facility did not give him enough therapy."  [employer]: Other (Comment) (recently has ben to [facility] and was DC home 12/22) Premorbid Home Care/DME Agencies: Other (Comment) (center well followed pt at home-has rollator and tub seat) Transportation available at discharge: ex-wife Is the patient able to respond to transportation needs?: Yes In the past 12 months, has lack of transportation kept you from medical appointments or from getting medications?: No In the past 12 months, has lack of transportation kept you from meetings, work, or from getting things needed for daily living?: No Resource referrals recommended: Neuropsychology  Discharge Planning Living Arrangements: Other (Comment) (ex-wife-[name]) Support Systems: Children, Other relatives Type of Residence: Private residence Insurance Resources: [employer], Multimedia programmer (specify) Nurse, mental health) Financial Resources: Radio broadcast assistant Screen Referred: No Living Expenses: Lives with family Money Management: Patient, Other (Comment) (ex-wife and son) Does the patient have any problems obtaining your medications?: No Home Management: ex-wife Patient/Family Preliminary Plans: The hope he will return to [name]'s home where he is mod/i level, will need to see if this is reachable. Aware hand has been affected by the stroke  so may not be able to do tube feedings. Care Coordinator Barriers to Discharge: Insurance for SNF coverage, Lack of/limited family support, Other (comments) Care Coordinator Barriers to Discharge Comments: On day 52 of acute stay for medicare and used 68 SNF days Care Coordinator Anticipated Follow Up Needs: HH/OP  Clinical Impression Pt having a rough day hopefully this will get better has  not done therapies today due to BP issues. Was staying with ex-wife who was there but not assisting him prior to admission, did oversee tube feedings. Son hopeful he will continue to do well was told while on acute was ambulating 100 ft CGA. He is hopeful he will be mod/I level by discharge and go home. If not not sure the plan. Pt will son be in co-pay days for his medicare day 22 and if plan to go SNF will be in co-pay days for SNF or need to go private pay. Will await evaluations by team.  Elease Hashimoto 01/07/2023, 1:16 PM

## 2023-01-07 NOTE — Evaluation (Signed)
Physical Therapy Assessment and Plan  Patient Details  Name: [name] MRN: [id number] Date of Birth: 1946-12-14  PT Diagnosis: Abnormal posture, Abnormality of gait, Difficulty walking, and Muscle weakness Rehab Potential: Good ELOS: 7-10 days   Today's Date: 01/07/2023 PT Individual Time: 1030-1050 PT Individual Time Calculation (min): 20 min  and Today's Date: 01/07/2023 PT Missed Time: 55 Minutes Missed Time Reason: Patient fatigue;Patient ill (Comment) (hypotension)   Hospital Problem: Principal Problem:   Acute ischemic left middle cerebral artery (MCA) stroke ([name])   Past Medical History:  Past Medical History:  Diagnosis Date   Aortic aneurysm ([place]) 05/2017   Chronic back pain    Diabetes mellitus without complication (HCC)    Dyslipidemia    GERD (gastroesophageal reflux disease)    H/O hiatal hernia    Hypertension    Melanoma ([place])    "back; left arm"   PONV (postoperative nausea and vomiting)    Rheumatoid arthritis ([place])    Spasm of esophagus    Past Surgical History:  Past Surgical History:  Procedure Laterality Date   aneursym repair     aneursym repair and stent placement  05/2017   BIOPSY  08/19/2022   Procedure: BIOPSY;  Surgeon: Dennis Artist, MD;  Location: [name];  Service: Gastroenterology;;   CARPAL TUNNEL RELEASE Bilateral    COLON SURGERY  2007   removed 18" of colon   ERCP W/ METAL STENT PLACEMENT  11/2005   [name] 12/14/2005   ESOPHAGOGASTRODUODENOSCOPY (EGD) WITH ESOPHAGEAL DILATION     "several times'   ESOPHAGOGASTRODUODENOSCOPY (EGD) WITH PROPOFOL N/A 08/19/2022   Procedure: ESOPHAGOGASTRODUODENOSCOPY (EGD) WITH PROPOFOL;  Surgeon: Dennis Artist, MD;  Location: [name];  Service: Gastroenterology;  Laterality: N/A;   HAND SURGERY Bilateral    "plastic knuckles"   IR ANGIO INTRA EXTRACRAN SEL COM CAROTID INNOMINATE UNI R MOD SED  01/03/2023   IR ANGIO VERTEBRAL SEL SUBCLAVIAN INNOMINATE UNI L MOD SED  01/03/2023    IR ANGIO VERTEBRAL SEL VERTEBRAL UNI R MOD SED  01/03/2023   IR INTRAVSC STENT CERV CAROTID W/EMB-PROT MOD SED INCL ANGIO  01/03/2023   IR US GUIDE VASC ACCESS RIGHT  01/03/2023   LAPAROSCOPIC CHOLECYSTECTOMY  01/2006   MELANOMA EXCISION     "back; left arm"   NASAL SEPTUM SURGERY     PELVIC FRACTURE SURGERY  1964   "busted in 2 places"   RADIOLOGY WITH ANESTHESIA N/A 01/03/2023   Procedure: IR WITH ANESTHESIA;  Surgeon: Radiologist, Medication, MD;  Location: [name];  Service: Radiology;  Laterality: N/A;   RESECTION DISTAL CLAVICAL Right 05/13/2016   Procedure: DISTAL CLAVICLE EXCISION;  Surgeon: Ninetta Lights, MD;  Location: [facility];  Service: Orthopedics;  Laterality: Right;   SHOULDER ARTHROSCOPY WITH ROTATOR CUFF REPAIR AND SUBACROMIAL DECOMPRESSION Right 05/13/2016   Procedure: RIGHT SHOULDER SCOPE DEBRIDEMENT, ACROMIOPLASTY, ROTATOR CUFF REPAIR; RELEASE BICEPS TENDON AND DEBRIDEMENT LABRUM;  Surgeon: Ninetta Lights, MD;  Location: [facility];  Service: Orthopedics;  Laterality: Right;   TIBIA FRACTURE SURGERY Left 2006   "put a steel rod in it"    Assessment & Plan Clinical Impression: Patient is a 77 year old male with history of PE/DVT, interstitial lung disease, RA, 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 Truman [facility] at [name]), readmission [name] 11/17-11/28 for AMS, aspiration PNA w/melena and recs for strict NPO. He was readmitted on 12/27/2022 with right wrist drop, hypotension and fever T-101.6 F with AMS. He was noted to be septic with lactic acidosis and was influenza A positive.  MRI brain revealed acute/subacute left frontal lobe and high left parietal  lobe infarct with 60-65% stenosis complex plaque L-ICA at origin, bulky calcified carotid siphon plaque L>R and severe R-VA stenosis. Incidental note made of visible retained food or debris in thoracic esophagus but only mild esophageal dilatation. He was treated with 4 L fluid boluses, Tamiflu and Vanc/Cefepime as well as stress dose steroids. Dr. Leonie Johnson felt that "stroke was embolic likely from cerebral hypoperfusion from sepsis with tandem stenosis of Left proximal and left cavernous carotid artery in setting of sepsis".  Carotid dopplers showed left 40-59% ICA stenosis. 2D echo showed EF 60-65% with no wall abnormality, moderate mitral and mild aortic calcification. He had recurrent fever up to 102 on 01/04 and angiogram/stenting postponed. Blood cultures X 2 negative. He had worsening of right sided weakness with increase in aphasia 01/05 with hypotension and CT/CTA head  negative for bleed and no LVO. He was treated with fluid bolus and MRI brain repeated 01/06 due to ongoing symptoms showing multiple known left cerebral infarcts without new infarct or extension.    Bedside swallow was repeated on 01/06 with trial of liquids as patient reported being on full liquid diet PTA. NPO recommended due to chronic dysphagia and concerns of aspiration.  He underwent cerebral angio with left carotid stent by dr. Romie Johnson on 01/08 with recs for BP 100-140 range, ASA/Brilinta and recs for follow up with ultrasound in 3 months. He is divorced but was staying with ex-wife since discharge from SNF a couple of weeks PTA. Wife plans on providing 24/7 care after discharge.Marland Kitchen He continues to be limited by RUE weakness and decrease in right foot clearance, fatigue requiring multiple rest breaks, flexed posture, ulnar deviation with contractures bilateral hand due to RA, oropharyngeal/esophageadysphagia, cognitive deficits with decreased recall as well as poor awareness of deficits. CIR recommended due to functional  decline.  Patient transferred to CIR on 01/06/2023 .   Patient currently requires min with mobility secondary to muscle weakness, decreased cardiorespiratoy endurance, and decreased standing balance, decreased postural control, and decreased balance strategies.  Prior to hospitalization, patient was modified independent  with mobility and lived with Alone in a House home.  Home access is 2Stairs to enter.  Patient will benefit from skilled PT intervention to maximize safe functional mobility, minimize fall risk, and decrease caregiver burden for planned discharge home with 24 hour supervision.  Anticipate patient will benefit from follow up [place] at discharge.  PT - End of Session Activity Tolerance: Tolerates < 10 min activity with changes in vital signs Endurance Deficit: Yes Endurance Deficit Description: orthostatic, symptomatic PT Assessment Rehab Potential (ACUTE/IP ONLY): Good PT Barriers to Discharge: Decreased caregiver support;Home environment access/layout;Lack of/limited family support;Insurance for SNF coverage;Nutrition means;Medication compliance PT Patient demonstrates impairments in the following area(s): Balance;Endurance;Motor;Nutrition;Safety;Skin Integrity PT Transfers Functional Problem(s): Bed Mobility;Bed to Chair;Car PT Locomotion Functional Problem(s): Ambulation;Stairs PT Plan PT Intensity: Minimum of 1-2 x/day ,45 to 90 minutes PT Frequency: 5 out of 7 days PT Duration Estimated Length of Stay: 7-10 days PT Treatment/Interventions: Ambulation/gait training;Discharge planning;Functional mobility training;Psychosocial support;Therapeutic Activities;Visual/perceptual remediation/compensation;Wheelchair propulsion/positioning;Therapeutic Exercise;Skin care/wound management;Neuromuscular re-education;Disease management/prevention;Balance/vestibular training;Cognitive remediation/compensation;DME/adaptive equipment instruction;Pain management;Splinting/orthotics;UE/LE Strength  taining/ROM;UE/LE Coordination activities;Stair  training;Patient/family education;Functional electrical stimulation;Community reintegration PT Transfers Anticipated Outcome(s): supervision PT Locomotion Anticipated Outcome(s): supervision PT Recommendation Recommendations for Other Services: Neuropsych consult Follow Up Recommendations: Home health PT Patient destination: Home Equipment Recommended: To be determined   PT Evaluation Precautions/Restrictions Precautions Precautions: Fall Precaution Comments: JG tube, NPO, hypotension Restrictions Weight Bearing Restrictions: No General PT Amount of Missed Time (min): 55 Minutes PT Missed Treatment Reason: Patient fatigue;Patient ill (Comment) (hypotension) Vital SignsTherapy Vitals Pulse Rate: 62 BP: (!) 103/53 Patient Position (if appropriate): Sitting Oxygen Therapy SpO2: 98 % O2 Device: Room Air Pain   Pain Interference Pain Interference Pain Effect on Sleep: 2. Occasionally Pain Interference with Therapy Activities: 2. Occasionally Pain Interference with Day-to-Day Activities: 2. Occasionally Home Living/Prior Functioning Home Living Available Help at Discharge: Available 24 hours/day Type of Home: House Home Access: Stairs to enter [employer] of Steps: 2 Entrance Stairs-Rails:  (HAS RAIL BUT UNSURE) Home Layout: One level Bathroom Shower/Tub: Multimedia programmer: Standard Bathroom Accessibility: Yes  Lives With: Alone Prior Function Level of Independence: Independent with basic ADLs;Independent with gait;Independent with transfers  Able to Take Stairs?: Yes Driving: Yes Vocation: Retired Vision/Perception  Vision - History Ability to See in Adequate Light: 0 Adequate Perception Perception: Impaired Inattention/Neglect: Does not attend to right side of body Praxis Praxis: Intact  Cognition Overall Cognitive Status: Impaired/Different from baseline Arousal/Alertness:  Awake/alert Orientation Level: Oriented X4 Attention: Selective;Alternating Focused Attention: Appears intact Sustained Attention: Appears intact Sustained Attention Impairment: Functional basic Selective Attention: Impaired Selective Attention Impairment: Functional basic Memory: Impaired Memory Impairment: Retrieval deficit Awareness: Impaired Awareness Impairment: Emergent impairment Problem Solving: Impaired Problem Solving Impairment: Functional basic Executive Function: Organizing;Initiating Organizing: Impaired Organizing Impairment: Functional basic Initiating: Impaired Initiating Impairment: Functional basic Behaviors: Other (comment) Safety/Judgment: Impaired Comments: will continue to monitor in functional context Sensation Sensation Light Touch: Appears Intact Hot/Cold: Appears Intact Proprioception: Appears Intact Stereognosis: Not tested Coordination Gross Motor Movements are Fluid and Coordinated: No Fine Motor Movements are Fluid and Coordinated: No Coordination and Movement Description: Generalized weakness and deconditioning Motor  Motor Motor: Abnormal postural alignment and control;Hemiplegia;Abnormal tone Motor - Skilled Clinical Observations: Weakness RUE more pronounced then LUE   Trunk/Postural Assessment  Cervical Assessment Cervical Assessment: Exceptions to [facility] (Forward head sitting and supine) Thoracic Assessment Thoracic Assessment: Exceptions to [facility] (flexed) Lumbar Assessment Lumbar Assessment: Exceptions to [facility] (posterior rotation of pelvis) Postural Control Postural Control: Deficits on evaluation Trunk Control: posterior bias  Balance Balance Balance Assessed: Yes Static Sitting Balance Static Sitting - Balance Support: Feet supported;No upper extremity supported Static Sitting - Level of Assistance: 5: Stand by assistance Dynamic Sitting Balance Dynamic Sitting - Balance Support: Feet supported;No upper extremity  supported Dynamic Sitting - Level of Assistance: 5: Stand by assistance Dynamic Sitting - Balance Activities: Lateral lean/weight shifting;Forward lean/weight shifting Static Standing Balance Static Standing - Balance Support: No upper extremity supported;During functional activity Static Standing - Level of Assistance: 4: Min assist Dynamic Standing Balance Dynamic Standing - Balance Support: No upper extremity supported;During functional activity Dynamic Standing - Level of Assistance: 3: Mod assist Dynamic Standing - Balance Activities: Lateral lean/weight shifting;Forward lean/weight shifting Extremity Assessment  RUE Assessment RUE Assessment: Exceptions to [facility] Active Range of Motion (AROM) Comments: ~120* shoulder flexion - thoracic flexion, forward head General Strength Comments: 4-/5 LUE Assessment LUE Assessment: Exceptions to [facility] Active Range of Motion (AROM) Comments: ~80* Shoulder flexion - cannot sustain - limited by pain in wrist General Strength Comments: NT LUE Body System: Neuro  Brunstrum levels for arm and hand: Arm;Hand Brunstrum level for arm: Stage IV Movement is deviating from synergy Brunstrum level for hand: Stage III Synergies performed voluntarily RLE Assessment RLE Assessment: Exceptions to [facility] General Strength Comments: Grossly 4/5 LLE Assessment LLE Assessment: Exceptions to [facility] General Strength Comments: Grossly 4/5  Care Tool Care Tool Bed Mobility Roll left and right activity   Roll left and right assist level: Supervision/Verbal cueing    Sit to lying activity   Sit to lying assist level: Supervision/Verbal cueing    Lying to sitting on side of bed activity   Lying to sitting on side of bed assist level: the ability to move from lying on the back to sitting on the side of the bed with no back support.: Supervision/Verbal cueing     Care Tool Transfers Sit to stand transfer   Sit to stand assist level: Contact Guard/Touching assist     Chair/bed transfer Chair/bed transfer activity did not occur: Safety/medical concerns       Psychologist, counselling transfer activity did not occur: Safety/medical concerns        Care Tool Locomotion Ambulation Ambulation activity did not occur: Safety/medical concerns        Walk 10 feet activity Walk 10 feet activity did not occur: Safety/medical concerns       Walk 50 feet with 2 turns activity Walk 50 feet with 2 turns activity did not occur: Safety/medical concerns      Walk 150 feet activity Walk 150 feet activity did not occur: Safety/medical concerns      Walk 10 feet on uneven surfaces activity Walk 10 feet on uneven surfaces activity did not occur: Safety/medical concerns      Stairs Stair activity did not occur: Safety/medical concerns        Walk up/down 1 step activity Walk up/down 1 step or curb (drop down) activity did not occur: Safety/medical concerns      Walk up/down 4 steps activity Walk up/down 4 steps activity did not occur: Safety/medical concerns      Walk up/down 12 steps activity Walk up/down 12 steps activity did not occur: Safety/medical concerns      Pick up small objects from floor Pick up small object from the floor (from standing position) activity did not occur: Safety/medical concerns      Wheelchair Is the patient using a wheelchair?: Yes Type of Wheelchair: Manual Wheelchair activity did not occur: Safety/medical concerns      Wheel 50 feet with 2 turns activity Wheelchair 50 feet with 2 turns activity did not occur: Safety/medical concerns    Wheel 150 feet activity Wheelchair 150 feet activity did not occur: Safety/medical concerns      Refer to Care Plan for [place] 1    Recommendations for other services: Neuropsych  Skilled Therapeutic Intervention Mobility Bed Mobility Bed Mobility: Rolling Right;Supine to Sit;Sit to Supine Rolling Right: Minimal Assistance -  Patient > 75% Rolling Left: Supervision/Verbal cueing Supine to Sit: Minimal Assistance - Patient > 75% Sit to Supine: Minimal Assistance - Patient > 75% Transfers Transfers: Sit to Stand;Stand to Sit Sit to Stand: Minimal Assistance - Patient > 75% Stand to Sit: Minimal Assistance - Patient > 75% Transfer (Assistive device): None Locomotion  Gait Ambulation: No Gait Gait: No High Level Ambulation High Level Ambulation: Side stepping Side Stepping: CGA/minA at edge of bed. Stairs / Additional Locomotion Stairs: No  Skilled Intervention: Pt resting in bed on arrival - pt in agreement to therapy evaluation but reports high levels of fatigue. Flat affect throughout session. Denies pain. Completed functional mobility as outlined above. Limited evaluation due to symptomatic hypotension. Overall he completes bed mobility with supervision, sit<>stands transfers with CGA, and lateral stepping along EOB with CGA/minA and no AD. Pt politely requesting to defer further mobility training due to fatigue. Assisted to bed and patient remained in bed at end of session. LPN made aware of pt's status and response to physical activity.   BP supine with thigh-high compression: 100/53 BP sitting with thigh-high compression: 87/55 *pt symptomatic    Discharge Criteria: Patient will be discharged from PT if patient refuses treatment 3 consecutive times without medical reason, if treatment goals not met, if there is a change in medical status, if patient makes no progress towards goals or if patient is discharged from hospital.  The above assessment, treatment plan, treatment alternatives and goals were discussed and mutually agreed upon: by patient  Alger Simons PT, DPT 01/07/2023, 12:47 PM

## 2023-01-07 NOTE — Plan of Care (Signed)
  Problem: RH Balance Goal: LTG Patient will maintain dynamic sitting balance (PT) Description: LTG:  Patient will maintain dynamic sitting balance with assistance during mobility activities (PT) Flowsheets (Taken 01/07/2023 1112) LTG: Pt will maintain dynamic sitting balance during mobility activities with:: Supervision/Verbal cueing Goal: LTG Patient will maintain dynamic standing balance (PT) Description: LTG:  Patient will maintain dynamic standing balance with assistance during mobility activities (PT) Flowsheets (Taken 01/07/2023 1112) LTG: Pt will maintain dynamic standing balance during mobility activities with:: Contact Guard/Touching assist   Problem: Sit to Stand Goal: LTG:  Patient will perform sit to stand with assistance level (PT) Description: LTG:  Patient will perform sit to stand with assistance level (PT) Flowsheets (Taken 01/07/2023 1112) LTG: PT will perform sit to stand in preparation for functional mobility with assistance level: Supervision/Verbal cueing   Problem: RH Bed Mobility Goal: LTG Patient will perform bed mobility with assist (PT) Description: LTG: Patient will perform bed mobility with assistance, with/without cues (PT). Flowsheets (Taken 01/07/2023 1112) LTG: Pt will perform bed mobility with assistance level of: Independent   Problem: RH Bed to Chair Transfers Goal: LTG Patient will perform bed/chair transfers w/assist (PT) Description: LTG: Patient will perform bed to chair transfers with assistance (PT). Flowsheets (Taken 01/07/2023 1112) LTG: Pt will perform Bed to Chair Transfers with assistance level: Supervision/Verbal cueing   Problem: RH Car Transfers Goal: LTG Patient will perform car transfers with assist (PT) Description: LTG: Patient will perform car transfers with assistance (PT). Flowsheets (Taken 01/07/2023 1112) LTG: Pt will perform car transfers with assist:: Contact Guard/Touching assist   Problem: RH Ambulation Goal: LTG Patient  will ambulate in controlled environment (PT) Description: LTG: Patient will ambulate in a controlled environment, # of feet with assistance (PT). Flowsheets (Taken 01/07/2023 1112) LTG: Pt will ambulate in controlled environ  assist needed:: Supervision/Verbal cueing LTG: Ambulation distance in controlled environment: 150' Goal: LTG Patient will ambulate in home environment (PT) Description: LTG: Patient will ambulate in home environment, # of feet with assistance (PT). Flowsheets (Taken 01/07/2023 1112) LTG: Pt will ambulate in home environ  assist needed:: Supervision/Verbal cueing LTG: Ambulation distance in home environment: 50'   Problem: RH Stairs Goal: LTG Patient will ambulate up and down stairs w/assist (PT) Description: LTG: Patient will ambulate up and down # of stairs with assistance (PT) Flowsheets (Taken 01/07/2023 1112) LTG: Pt will ambulate up/down stairs assist needed:: Contact Guard/Touching assist LTG: Pt will  ambulate up and down number of stairs: 4 with 1 rail or as per home setup

## 2023-01-07 NOTE — Progress Notes (Signed)
[facility] Individual Statement of Services  Patient Name:  [name]  Date:  01/07/2023  Welcome to the [facility].  Our goal is to provide you with an individualized program based on your diagnosis and situation, designed to meet your specific needs.  With this comprehensive rehabilitation program, you will be expected to participate in at least 3 hours of rehabilitation therapies Monday-Friday, with modified therapy programming on the weekends.  Your rehabilitation program will include the following services:  Physical Therapy (PT), Occupational Therapy (OT), Speech Therapy (ST), 24 hour per day rehabilitation nursing, Therapeutic Recreaction (TR), Neuropsychology, Care Coordinator, Rehabilitation Medicine, Nutrition Services, and Pharmacy Services  Weekly team conferences will be held on Wednesday to discuss your progress.  Your Inpatient Rehabilitation Care Coordinator will talk with you frequently to get your input and to update you on team discussions.  Team conferences with you and your family in attendance may also be held.  Expected length of stay: 10-12 days  Overall anticipated outcome: supervision-CGA level with tub transfer  Depending on your progress and recovery, your program may change. Your Inpatient Rehabilitation Care Coordinator will coordinate services and will keep you informed of any changes. Your Inpatient Rehabilitation Care Coordinator's name and contact numbers are listed  below.  The following services may also be recommended but are not provided by the [name]:   [name] will be made to provide these services after discharge if needed.  Arrangements include referral to agencies that provide these services.  Your insurance has been verified to be:  medicare and [place] Your primary doctor is:  Alden Hipp  Pertinent information will be shared with your doctor and your insurance company.  Inpatient Rehabilitation Care Coordinator:  Ovidio Kin, [place] or ([phone]  Information discussed with and copy given to patient by: Elease Hashimoto, 01/07/2023, 1:19 PM

## 2023-01-07 NOTE — Progress Notes (Signed)
Initial Nutrition Assessment  DOCUMENTATION CODES:   Severe malnutrition in context of chronic illness  INTERVENTION:  - Modify TF to Osmolite 1.5 @ 95 mL/hr over 16 hrs (4PM-8AM) - Add TF20 PS x1 - This will provide the pt with 2360 kcals, 115 gm protein and 1158 mL free water.   NUTRITION DIAGNOSIS:   Severe Malnutrition related to chronic illness as evidenced by severe fat depletion, severe muscle depletion, percent weight loss.  GOAL:   Provide needs based on ASPEN/SCCM guidelines  MONITOR:   TF tolerance  REASON FOR ASSESSMENT:   Consult Enteral/tube feeding initiation and management  ASSESSMENT:   77 y.o. male admits to CIR related to functional deficits due to stroke. PMH includes: recurrent DVTs, rheumatoid arthritis, oropharyngeal cancer, T2DM. Pt with hx of esophageal outlet obstructions s/p multiple dilatations and GJ tube placement for FTT.  Meds reviewed: lipitor, sliding scale insulin, synthroid, prednisone, thiamine. Labs reviewed: Magnesium low.   Pt is NPO with G-J tube. Pt with transfer to CIR and TF were ordered as previous RD had ordered. Pt is tolerating TF well. Now that pt is in rehab, RD will modify TF to nocturnal feeds over 16 hrs to allow time pt off of TF for therapy. Per record, pt has experienced an 11.6% wt loss in 5 months. RD will continue to monitor TF tolerance.   NUTRITION - FOCUSED PHYSICAL EXAM:  Flowsheet Row Most Recent Value  Orbital Region Moderate depletion  Upper Arm Region Severe depletion  Thoracic and Lumbar Region Severe depletion  Buccal Region Moderate depletion  Temple Region Moderate depletion  Clavicle Bone Region Severe depletion  Clavicle and Acromion Bone Region Severe depletion  Scapular Bone Region Severe depletion  Dorsal Hand Severe depletion  Patellar Region Severe depletion  Anterior Thigh Region Severe depletion  Posterior Calf Region Severe depletion  Edema (RD Assessment) None  Hair Reviewed  Eyes  Reviewed  Mouth Reviewed  Skin Reviewed  Nails Reviewed       Diet Order:   Diet Order             Diet NPO time specified  Diet effective now                   EDUCATION NEEDS:   Not appropriate for education at this time  Skin:  Skin Assessment: Reviewed RN Assessment  Last BM:  01/06/23  Height:   Ht Readings from Last 1 Encounters:  01/06/23 '6\' 2"'$  (1.88 m)    Weight:   Wt Readings from Last 1 Encounters:  01/06/23 72.1 kg    Ideal Body Weight:     BMI:  Body mass index is 20.41 kg/m.  Estimated Nutritional Needs:   Kcal:  2165-2525 kcals  Protein:  105-125 gm  Fluid:  >/= 2.1 L  Thalia Bloodgood, RD, LDN, CNSC.

## 2023-01-07 NOTE — Progress Notes (Signed)
Patient ID: [name], male   DOB: [birth year], 77 y.o.   MRN: [id number] Met with the patient to introduce self, review current situation, rehab process, team conference and plan of care. Patient is very vague; poor historian. Reported he lived on his own, people check on him,although chart notes patient staying with ex-wife PTA after discharge from SNF. Reviewed diet; patient reported liquid diet PTA/PEG "for a long time"; chart indicated NPO recommended due to chronic dysphagia and concerns of aspiration. Patient is weak, TTEDs on and noted staff checking his blood pressure. Able to notify staff of need for toileting; urgency noted.  Continue to follow along to address educational needs, review nutritional means and medications to facilitate preparation for discharge. Margarito Liner

## 2023-01-08 LAB — GLUCOSE, CAPILLARY
Glucose-Capillary: 108 mg/dL — ABNORMAL HIGH (ref 70–99)
Glucose-Capillary: 117 mg/dL — ABNORMAL HIGH (ref 70–99)
Glucose-Capillary: 140 mg/dL — ABNORMAL HIGH (ref 70–99)
Glucose-Capillary: 166 mg/dL — ABNORMAL HIGH (ref 70–99)
Glucose-Capillary: 167 mg/dL — ABNORMAL HIGH (ref 70–99)
Glucose-Capillary: 168 mg/dL — ABNORMAL HIGH (ref 70–99)
Glucose-Capillary: 172 mg/dL — ABNORMAL HIGH (ref 70–99)

## 2023-01-08 MED ORDER — FAMOTIDINE 20 MG PO TABS
20.0000 mg | ORAL_TABLET | Freq: Two times a day (BID) | ORAL | Status: DC
  Administered 2023-01-08 – 2023-01-10 (×4): 20 mg
  Filled 2023-01-08 (×5): qty 1

## 2023-01-08 NOTE — Progress Notes (Addendum)
PROGRESS NOTE   Subjective/Complaints: Pt in bed, c/o foot pain , has tight ACE wraps Ros neg CP, SOB, N/V/D Objective:   VAS [place] LOWER EXTREMITY VENOUS (DVT)  Result Date: 01/07/2023  Lower Venous DVT Study Patient Name:  [name]  Date of Exam:   01/07/2023 Medical Rec #: [id number]           Accession #:    [id number] Date of Birth: [birth year]          Patient Gender: M Patient Age:   77 years Exam Location:  [facility] Procedure:      VAS [place] LOWER EXTREMITY VENOUS (DVT) Referring Phys: PAMELA LOVE --------------------------------------------------------------------------------  Indications: Stroke.  Risk Factors: None identified. Comparison Study: No prior studies. Performing Technologist: Oliver Hum RVT  Examination Guidelines: A complete evaluation includes B-mode imaging, spectral Doppler, color Doppler, and power Doppler as needed of all accessible portions of each vessel. Bilateral testing is considered an integral part of a complete examination. Limited examinations for reoccurring indications may be performed as noted. The reflux portion of the exam is performed with the patient in reverse Trendelenburg.  +---------+---------------+---------+-----------+----------+--------------+ RIGHT    CompressibilityPhasicitySpontaneityPropertiesThrombus Aging +---------+---------------+---------+-----------+----------+--------------+ CFV      Full           Yes      Yes                                 +---------+---------------+---------+-----------+----------+--------------+ SFJ      Full                                                        +---------+---------------+---------+-----------+----------+--------------+ FV Prox  Full                                                        +---------+---------------+---------+-----------+----------+--------------+ FV Mid   Full                                                         +---------+---------------+---------+-----------+----------+--------------+ FV DistalFull                                                        +---------+---------------+---------+-----------+----------+--------------+ PFV      Full                                                        +---------+---------------+---------+-----------+----------+--------------+  POP      Full           Yes      Yes                                 +---------+---------------+---------+-----------+----------+--------------+ PTV      Full                                                        +---------+---------------+---------+-----------+----------+--------------+ PERO     Full                                                        +---------+---------------+---------+-----------+----------+--------------+   +---------+---------------+---------+-----------+----------+--------------+ LEFT     CompressibilityPhasicitySpontaneityPropertiesThrombus Aging +---------+---------------+---------+-----------+----------+--------------+ CFV      Full           Yes      Yes                                 +---------+---------------+---------+-----------+----------+--------------+ SFJ      Full                                                        +---------+---------------+---------+-----------+----------+--------------+ FV Prox  Full                                                        +---------+---------------+---------+-----------+----------+--------------+ FV Mid   Full                                                        +---------+---------------+---------+-----------+----------+--------------+ FV DistalFull                                                        +---------+---------------+---------+-----------+----------+--------------+ PFV      Full                                                         +---------+---------------+---------+-----------+----------+--------------+ POP      Full           Yes      Yes                                 +---------+---------------+---------+-----------+----------+--------------+  PTV      Full                                                        +---------+---------------+---------+-----------+----------+--------------+ PERO     Full                                                        +---------+---------------+---------+-----------+----------+--------------+     Summary: RIGHT: - There is no evidence of deep vein thrombosis in the lower extremity.  - No cystic structure found in the popliteal fossa.  LEFT: - There is no evidence of deep vein thrombosis in the lower extremity.  - No cystic structure found in the popliteal fossa.  *See table(s) above for measurements and observations. Electronically signed by Monica Martinez MD on 01/07/2023 at 2:05:48 PM.    Final    DG Wrist 2 Views Right  Result Date: 01/07/2023 CLINICAL DATA:  Pain post stroke, possible fall EXAM: RIGHT WRIST - 2 VIEW COMPARISON:  None Available. FINDINGS: Carpal rows intact. Negative for fracture. Mild DJD at the first carpometacarpal articulation with small spurs. Normal alignment and mineralization. Regional soft tissues unremarkable. IMPRESSION: No acute findings. Mild first CMC DJD. Electronically Signed   By: Lucrezia Europe M.D.   On: 01/07/2023 10:02   DG Chest 2 View  Result Date: 01/06/2023 CLINICAL DATA:  Aspiration EXAM: CHEST - 2 VIEW COMPARISON:  Chest x-ray dated January 01, 2023 FINDINGS: Cardiac and mediastinal contours are within normal limits. Elevation of the right hemidiaphragm. Decreased left-greater-than-right basilar opacities. Evidence of pneumothorax. IMPRESSION: Decreased left-greater-than-right basilar opacities, likely resolving aspiration. Recommend follow-up chest x-ray in 3-4 weeks to ensure complete resolution. Electronically Signed   By: Yetta Glassman M.D.   On: 01/06/2023 17:51   Recent Labs    01/07/23 0608  WBC 9.0  HGB 9.0*  HCT 27.8*  PLT 232    Recent Labs    01/07/23 0608  NA 136  K 3.5  CL 102  CO2 23  GLUCOSE 100*  BUN 20  CREATININE 0.71  CALCIUM 8.4*     Intake/Output Summary (Last 24 hours) at 01/08/2023 1458 Last data filed at 01/08/2023 0557 Gross per 24 hour  Intake 1000 ml  Output 775 ml  Net 225 ml         Physical Exam: Vital Signs Blood pressure (!) 108/52, pulse 69, temperature 98.8 F (37.1 C), temperature source Oral, resp. rate 18, height '6\' 2"'$  (1.88 m), weight 72.1 kg, SpO2 97 %. Constitutional:      Appearance: BMI 20.41. He is ill-appearing.     Comments: Sitting in bed with multiple cups at bedside--one with ice chips, one w/water and 2 w/ frothy yellow expectorated sputum.   Cardiac: RRR. No murmurs, rubs, or gallops. + RUE edema, mild.  Resp: CTAB. On RA. Thick yellow sputum in bedside cup.  Abdominal:     Comments: + PEG tube mild tenderness     General: Skin is warm and dry. +scarring on neck from prior trach, healed. +2X R forearm Ivs.  MSK: Severe ulnar deviation of fingers in L hand d/t RA; less pronounced on R  hand.    Neurological:     Mental Status: He is alert.     Comments: Oriented to self, place,      RUE 3/5 shoulder abduction, 4-/5 elbow flexion, 3+/5 elbow extension, 2+/5 wrist extension, 1-/5 finger abduction, 2+/5 grip RLE 4-/5 hip flexion, 5-/5 knee extension, 5/5 ankle DF, PF.  Assessment/Plan: 1. Functional deficits which require 3+ hours per day of interdisciplinary therapy in a comprehensive inpatient rehab setting. Physiatrist is providing close team supervision and 24 hour management of active medical problems listed below. Physiatrist and rehab team continue to assess barriers to discharge/monitor patient progress toward functional and medical goals  Care Tool:  Bathing    Body parts bathed by patient: Right arm, Chest, Abdomen, Right  upper leg, Left upper leg, Face   Body parts bathed by helper: Left arm, Left lower leg, Right lower leg, Front perineal area, Buttocks     Bathing assist Assist Level: Moderate Assistance - Patient 50 - 74%     Upper Body Dressing/Undressing Upper body dressing   What is the patient wearing?: Pull over shirt    Upper body assist Assist Level: Moderate Assistance - Patient 50 - 74%    Lower Body Dressing/Undressing Lower body dressing      What is the patient wearing?: Incontinence brief, Pants     Lower body assist Assist for lower body dressing: Maximal Assistance - Patient 25 - 49%     Toileting Toileting    Toileting assist Assist for toileting: Maximal Assistance - Patient 25 - 49%     Transfers Chair/bed transfer  Transfers assist  Chair/bed transfer activity did not occur: Safety/medical concerns  Chair/bed transfer assist level: Minimal Assistance - Patient > 75%     Locomotion Ambulation   Ambulation assist   Ambulation activity did not occur: Safety/medical concerns          Walk 10 feet activity   Assist  Walk 10 feet activity did not occur: Safety/medical concerns        Walk 50 feet activity   Assist Walk 50 feet with 2 turns activity did not occur: Safety/medical concerns         Walk 150 feet activity   Assist Walk 150 feet activity did not occur: Safety/medical concerns         Walk 10 feet on uneven surface  activity   Assist Walk 10 feet on uneven surfaces activity did not occur: Safety/medical concerns         Wheelchair     Assist Is the patient using a wheelchair?: Yes Type of Wheelchair: Manual Wheelchair activity did not occur: Safety/medical concerns         Wheelchair 50 feet with 2 turns activity    Assist    Wheelchair 50 feet with 2 turns activity did not occur: Safety/medical concerns       Wheelchair 150 feet activity     Assist  Wheelchair 150 feet activity did not  occur: Safety/medical concerns       Blood pressure (!) 108/52, pulse 69, temperature 98.8 F (37.1 C), temperature source Oral, resp. rate 18, height '6\' 2"'$  (1.88 m), weight 72.1 kg, SpO2 97 %.    Medical Problem List and Plan: 1. 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  Antithrombotics: -  DVT/anticoagulation:  Mechanical: Sequential compression devices, below knee Bilateral lower extremities due to recurrent GIB             -antiplatelet therapy:  Brilinta and ASA 3. 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 Mood/Behavior/Sleep: LCSW to follow for evaluation and support.              -antipsychotic agents: N/A 5. Neuropsych/cognition: This patient is not fully capable of making decisions on his own behalf. 6. Skin/Wound Care: Routine pressure relief. Continue tube feeds             --Continue Male pure wick and rectal tube tonight.  --Question patient awareness/continence.   --Will d/c in am and start toileting program.  7. Fluids/Electrolytes/Nutrition: Continue NPO with tube feeds.              --he reports that he was cleared for ice by [facility]. Will reach out to confirm; NPO in interim given high aspiration risk. --On continuous tube feed-->will change CBGs to every 4 hours. .  8. Left carotid stent: Continue ASA/Brilinta.  9. 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 Will recheck in am. 10. 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 Resume PPI.  --Monitor H/H with serial checks. Has dropped from  10.3-->8.5 --Stool brown. Monitor for signs of bleeding.  11. Thrombocytopenia:Continue to monitor as have ranged187-->94-->142.             --monitor for signs of bleeding.  12. Aspiration PNA/Pneumonitis: On stress dose steriods D# 4/5.              -- 1/11 CXR 2 view w/ongoing frothy cough -> Decreased  left-greater-than-right basilar opacities, likely resolving aspiration. Recommend follow-up chest x-ray in 3-4 weeks to ensure complete resolution.             --encourage flutter valve every 4 hrs WA.   13. T2DM: Hgb A1c-6.2. Monitor CBGs every 4 hours and use SSI for elevated BS             --on Prednisone to taper off in 24 hours. Monitor for improvement off this.       Recent Labs    01/06/23 0344 01/06/23 0808 01/06/23 1145  GLUCAP 169* 225* 116*   CBGs >200: d/c feeding supplement   14.Influenza A: Completed Tamiflu X 5 days 15. RA: Was on monthly Orenzia w/ prednisone to 1 mg daily as well as methotrexate and tadalafil. CXR reviewed and shows resolving infiltrate.              --taper to home dose 01/13.  16. Code status: continue Full code per palliative care notes 12/15/22.     LOS: 2 days A FACE TO FACE EVALUATION WAS PERFORMED  Charlett Blake 01/08/2023, 2:58 PM

## 2023-01-08 NOTE — Progress Notes (Incomplete)
Physical Therapy Session Note  Patient Details  Name: [name] MRN: [id number] Date of Birth: 1946/01/22  Today's Date: 01/08/2023 PT Individual Time: 2111-5520 PT Individual Time Calculation (min): 60 min   Short Term Goals: Week 1:     Skilled Therapeutic Interventions/Progress Updates:      Therapy Documentation Precautions:  Precautions Precautions: Fall Precaution Comments: JG tube, NPO, hypotension Restrictions Weight Bearing Restrictions: No General:   Vital Signs: Therapy Vitals Temp: 98.4 F (36.9 C) Pulse Rate: 66 Resp: 18 BP: (!) 111/57 Patient Position (if appropriate): Lying Oxygen Therapy SpO2: 100 % O2 Device: Room Air Pain: Pain Assessment Pain Scale: Faces Faces Pain Scale: No hurt Mobility:   Locomotion :    Trunk/Postural Assessment :    Balance:   Exercises:   Other Treatments:      Therapy/Group: Individual Therapy  Lorie Phenix 01/08/2023, 6:08 PM

## 2023-01-08 NOTE — IPOC Note (Signed)
Overall Plan of Care [facility]) Patient Details Name: [name] MRN: [id number] DOB: 1946/06/02  Admitting Diagnosis: Acute ischemic left middle cerebral artery (MCA) stroke [facility])  Hospital Problems: Principal Problem:   Acute ischemic left middle cerebral artery (MCA) stroke ([place])     Functional Problem List: Nursing Bladder, Bowel, Safety, Endurance, Medication Management, Nutrition  PT Balance, Endurance, Motor, Nutrition, Safety, Skin Integrity  OT Balance, Motor, Skin Integrity, Cognition, Pain, Edema, Perception, Endurance, Safety  SLP    TR         Basic ADL's: OT Grooming, Bathing, Dressing, Toileting     Advanced  ADL's: OT       Transfers: PT Bed Mobility, Bed to Chair, Car  OT Tub/Shower     Locomotion: PT Ambulation, Stairs     Additional Impairments: OT Fuctional Use of Upper Extremity  SLP Social Cognition, Swallowing   Problem Solving, Memory, Attention  TR      Anticipated Outcomes Item Anticipated Outcome  Self Feeding Patient will be able to direct care regarding JG tube feeding  Swallowing      Basic self-care  Mod I  Toileting  Mod I   Bathroom Transfers Mod I  Bowel/Bladder  manage bowel w mod I and bladder w toileting  Transfers  supervision  Locomotion  supervision  Communication     Cognition  Min A  Pain  n/a  Safety/Judgment  manage w cues   Therapy Plan: PT Intensity: Minimum of 1-2 x/day ,45 to 90 minutes PT Frequency: 5 out of 7 days PT Duration Estimated Length of Stay: 7-10 days OT Intensity: Minimum of 1-2 x/day, 45 to 90 minutes OT Frequency: 5 out of 7 days OT Duration/Estimated Length of Stay: 10-12 days SLP Intensity: Minumum of 1-2 x/day, 30 to 90 minutes SLP Frequency: 3 to 5 out of 7 days SLP Duration/Estimated Length of Stay: 10-12 days   Team Interventions: Nursing Interventions Bladder Management, Bowel Management, Disease Management/Prevention, Medication Management, Discharge Planning,  Patient/Family Education, Dysphagia/Aspiration Precaution Training  PT interventions Ambulation/gait training, Discharge planning, Functional mobility training, Psychosocial support, Therapeutic Activities, Visual/perceptual remediation/compensation, Wheelchair propulsion/positioning, Therapeutic Exercise, Skin care/wound management, Neuromuscular re-education, Disease management/prevention, Training and development officer, Cognitive remediation/compensation, DME/adaptive equipment instruction, Pain management, Splinting/orthotics, UE/LE Strength taining/ROM, UE/LE Coordination activities, Stair training, Patient/family education, Functional electrical stimulation, Community reintegration  OT Interventions Training and development officer, Disease mangement/prevention, Neuromuscular re-education, Patient/family education, Self Care/advanced ADL retraining, Splinting/orthotics, Therapeutic Exercise, UE/LE Coordination activities, Cognitive remediation/compensation, Discharge planning, DME/adaptive equipment instruction, Functional mobility training, Pain management, Psychosocial support, Therapeutic Activities, UE/LE Strength taining/ROM, Visual/perceptual remediation/compensation  SLP Interventions Cognitive remediation/compensation, Dysphagia/aspiration precaution training, Internal/external aids, Speech/Language facilitation, Therapeutic Activities, Environmental controls, Cueing hierarchy, Patient/family education, Functional tasks  TR Interventions    SW/CM Interventions Discharge Planning, Psychosocial Support, Patient/Family Education   Barriers to Discharge MD  Medical stability  Nursing Decreased caregiver support, Home environment access/layout, Nutrition means 1 leve 2 ste w rail; staying w ex-wife, receiving HH from [place], has rollator; PEG  PT Decreased caregiver support, Home environment access/layout, Lack of/limited family support, Insurance for SNF coverage, Nutrition means, Medication  compliance    OT      SLP      SW Insurance for SNF coverage, Lack of/limited family support, Other (comments) On day 75 of acute stay for medicare and used 68 SNF days   Team Discharge Planning: Destination: PT-Home ,OT- Home , SLP-Home Projected Follow-up: PT-Home health PT, OT-  Home health OT, SLP-Outpatient SLP, Home Health SLP, 24 hour supervision/assistance  Projected Equipment Needs: PT-To be determined, OT- To be determined, SLP-None recommended by SLP Equipment Details: PT- , OT-  Patient/family involved in discharge planning: PT- Patient,  OT-Patient, SLP-Patient  MD ELOS: 10-14d Medical Rehab Prognosis:  Fair Assessment: The patient has been admitted for CIR therapies with the diagnosis of Left MCA inf. The team will be addressing functional mobility, strength, stamina, balance, safety, adaptive techniques and equipment, self-care, bowel and bladder mgt, patient and caregiver education, Orthostatic hypotension. Goals have been set at Sup. Anticipated discharge destination is Home .        See Team Conference Notes for weekly updates to the plan of care

## 2023-01-08 NOTE — Progress Notes (Signed)
Occupational Therapy Session Note  Patient Details  Name: [name] MRN: [id number] Date of Birth: [birth year]  Today's Date: 01/08/2023 OT Individual Time: 5638-9373 OT Individual Time Calculation (min): 55 min    Short Term Goals: Week 1:  OT Short Term Goal 1 (Week 1): Patient will complete shower trasnfer with min assist OT Short Term Goal 2 (Week 1): Patient will shower with min assist OT Short Term Goal 3 (Week 1): Patient will dress upper body with min assist OT Short Term Goal 4 (Week 1): Patient will demonstrate 30* of active wrist extension without increase in pain symptoms  Skilled Therapeutic Interventions/Progress Updates:    Patient received supine in bed.  Agreeable and excited for shower. BP initially low in seated but recovered with seated rest.  Patient declined need for toilet.  Transferred to wheelchair then shower seat.  Standing x +20 seconds at a time without symptoms, with useof grab bar or sink.  Patient able to dress himself with min assist.  Patient sustaining stable BP after shower and agreeable to stay up in wheelchair until next session (in 45 min.)  Improved endurance noted this session.  Patient left up in wheelchair with call bell in reach and safety belt in place and engaged.  Therapy Documentation Precautions:  Precautions Precautions: Fall Precaution Comments: JG tube, NPO, hypotension Restrictions Weight Bearing Restrictions: No Vitals:   BP:   109/57 Supine 84/59 - seated initially 117/59 - seated after 5 min 11/52 - seated after shower with thigh high teds in place  Pain:  Declines pain initially.  Patient has pain with movement of right wrist   Therapy/Group: Individual Therapy  Mariah Milling 01/08/2023, 10:28 AM

## 2023-01-08 NOTE — Progress Notes (Signed)
Speech Language Pathology Daily Session Note  Patient Details  Name: RICHY SPRADLEY MRN: [id number] Date of Birth: 01-02-1946  Today's Date: 01/08/2023 SLP Individual Time: 1300-1345 SLP Individual Time Calculation (min): 45 min  Short Term Goals: Week 1: SLP Short Term Goal 1 (Week 1): Patient will consistently complete oral care via the suction toothbrush with Min verbal cues for set-up/frequency. SLP Short Term Goal 2 (Week 1): Patient will demonstrate sustained attention to functional tasks for 30 minutes with Min verbal cues for redirection. SLP Short Term Goal 3 (Week 1): Patient will demonstrate functional problem solving for basic and familiar tasks with Min verbal cues. SLP Short Term Goal 4 (Week 1): Patient will demonstrate recall of functional information with Mod verbal and visual cues.  Skilled Therapeutic Interventions: Skilled intervention focused on cognition. Pt given memory notebook and recalled 3 activities from PT and OT sessions with mod A for recall. Educated pt on memory book and purpose. Reviewed medications with patient and completed recall task on medication name/purpose with max A. Instructed pt to inform other therapists of memory book and purpose. Cont with therapy per plan of care.      Pain Pain Assessment Pain Scale: Faces Faces Pain Scale: No hurt  Therapy/Group: Individual Therapy  Darrol Poke Bria Portales 01/08/2023, 3:03 PM

## 2023-01-09 LAB — GLUCOSE, CAPILLARY
Glucose-Capillary: 181 mg/dL — ABNORMAL HIGH (ref 70–99)
Glucose-Capillary: 169 mg/dL — ABNORMAL HIGH (ref 70–99)
Glucose-Capillary: 136 mg/dL — ABNORMAL HIGH (ref 70–99)
Glucose-Capillary: 121 mg/dL — ABNORMAL HIGH (ref 70–99)
Glucose-Capillary: 139 mg/dL — ABNORMAL HIGH (ref 70–99)

## 2023-01-09 MED ORDER — ACETAMINOPHEN-CODEINE 300-30 MG PO TABS
1.0000 | ORAL_TABLET | Freq: Four times a day (QID) | ORAL | Status: AC | PRN
  Administered 2023-01-09 – 2023-01-10 (×3): 1
  Filled 2023-01-09 (×3): qty 1

## 2023-01-09 NOTE — Progress Notes (Signed)
Patient c/o  headache; throat pain refusing tylenol and requesting to call MD for a stronger pain medicine. MD notified new orders noted.

## 2023-01-09 NOTE — Progress Notes (Signed)
Occupational Therapy Session Note  Patient Details  Name: [name] MRN: [id number] Date of Birth: September 03, 1946  Today's Date: 01/09/2023 OT Individual Time: 1002-1030 OT Individual Time Calculation (min): 28 min    Short Term Goals: Week 1:  OT Short Term Goal 1 (Week 1): Patient will complete shower trasnfer with min assist OT Short Term Goal 2 (Week 1): Patient will shower with min assist OT Short Term Goal 3 (Week 1): Patient will dress upper body with min assist OT Short Term Goal 4 (Week 1): Patient will demonstrate 30* of active wrist extension without increase in pain symptoms   Skilled Therapeutic Interventions/Progress Updates:    Per staff (nursing and MD) pt eager for activity this date and not wanting to lay in bed and to see the pt if any cancellations. Pt greeted at time of session bed level and agreeable to additional session despite not being scheduled. Supine > sit Supervision and BP at 107/90. C/o mild dizzines, worsened with activity but checking BP throughout session and no significant drop throughout. Note TEDS already donned, declined ACE wraps, and educated on pacing and breathing throughout activity. No binder in room. Stand pivot with CGA bed > wheelchair with RW, set up at sink and performing standing activity approx 1 minute while washing face. Pt performing several sit to stands and BP cheked throughout. Set up at end of session alarm on call bell in reach. Nursing aware pt up in chair.   Therapy Documentation Precautions:  Precautions Precautions: Fall Precaution Comments: JG tube, NPO, hypotension Restrictions Weight Bearing Restrictions: No     Therapy/Group: Individual Therapy  Viona Gilmore 01/09/2023, 10:28 AM

## 2023-01-10 LAB — BASIC METABOLIC PANEL
Anion gap: 9 (ref 5–15)
BUN: 28 mg/dL — ABNORMAL HIGH (ref 8–23)
CO2: 23 mmol/L (ref 22–32)
Calcium: 8.4 mg/dL — ABNORMAL LOW (ref 8.9–10.3)
Chloride: 102 mmol/L (ref 98–111)
Creatinine, Ser: 0.81 mg/dL (ref 0.61–1.24)
GFR, Estimated: >60 mL/min (ref >60)
Glucose, Bld: 173 mg/dL — ABNORMAL HIGH (ref 70–99)
Potassium: 4.2 mmol/L (ref 3.5–5.1)
Sodium: 134 mmol/L — ABNORMAL LOW (ref 135–145)

## 2023-01-10 LAB — GLUCOSE, CAPILLARY
Glucose-Capillary: 116 mg/dL — ABNORMAL HIGH (ref 70–99)
Glucose-Capillary: 134 mg/dL — ABNORMAL HIGH (ref 70–99)
Glucose-Capillary: 167 mg/dL — ABNORMAL HIGH (ref 70–99)
Glucose-Capillary: 178 mg/dL — ABNORMAL HIGH (ref 70–99)
Glucose-Capillary: 193 mg/dL — ABNORMAL HIGH (ref 70–99)
Glucose-Capillary: 208 mg/dL — ABNORMAL HIGH (ref 70–99)

## 2023-01-10 MED ORDER — ESOMEPRAZOLE MAGNESIUM 40 MG PO PACK
40.0000 mg | PACK | Freq: Every day | ORAL | Status: DC
  Administered 2023-01-11 – 2023-01-14 (×4): 40 mg
  Filled 2023-01-10 (×5): qty 40

## 2023-01-10 MED ORDER — BLISTEX MEDICATED EX OINT: TOPICAL_OINTMENT | CUTANEOUS | Status: DC | PRN

## 2023-01-10 MED ORDER — FAMOTIDINE 20 MG PO TABS
20.0000 mg | ORAL_TABLET | Freq: Two times a day (BID) | ORAL | Status: AC
  Administered 2023-01-10: 20 mg
  Filled 2023-01-10: qty 1

## 2023-01-10 MED ORDER — NON FORMULARY: 20.0000 mg | Freq: Every day | Status: DC

## 2023-01-10 MED ORDER — FREE WATER
200.0000 mL | Status: DC
  Administered 2023-01-10 – 2023-01-12 (×9): 200 mL

## 2023-01-10 MED ORDER — MAGNESIUM OXIDE -MG SUPPLEMENT 400 (240 MG) MG PO TABS
400.0000 mg | ORAL_TABLET | Freq: Two times a day (BID) | ORAL | Status: DC
  Administered 2023-01-10 – 2023-01-15 (×10): 400 mg
  Filled 2023-01-10 (×10): qty 1

## 2023-01-10 NOTE — Progress Notes (Cosign Needed)
Mg level low again at 1.3. Added Mag ox bid per tube. Due to rise in BUN will increase water flushes to 200 cc every 4 hours. Large loose BM 01/12. Will recheck BMET/Mg level on Thursday

## 2023-01-10 NOTE — Progress Notes (Addendum)
PROGRESS NOTE   Subjective/Complaints: Pt feels good today , no HA, took T#3 x 2 doses yesterday  Changed to famotidine due to TF, cannot crush pantoprazole  Ros neg CP, SOB, N/V/D Objective:   No results found. No results for input(s): "WBC", "HGB", "HCT", "PLT" in the last 72 hours.  Recent Labs    01/10/23 0635  NA 134*  K 4.2  CL 102  CO2 23  GLUCOSE 173*  BUN 28*  CREATININE 0.81  CALCIUM 8.4*     Intake/Output Summary (Last 24 hours) at 01/10/2023 0911 Last data filed at 01/09/2023 1518 Gross per 24 hour  Intake --  Output 725 ml  Net -725 ml         Physical Exam: Vital Signs Blood pressure (!) 109/48, pulse 73, temperature 98.3 F (36.8 C), temperature source Oral, resp. rate 18, height '6\' 2"'$  (1.88 m), weight 73.5 kg, SpO2 100 %. Constitutional:      Appearance: BMI 20.41. He is ill-appearing.     Comments: Sitting in bed with multiple cups at bedside--one with ice chips, one w/water  Cardiac: RRR no rubs , Murmurs or ES  Resp: CTAB. On RA. No cough or resp distress  Abdominal:     Comments: + PEG tube no tenderness, stoma is CDI      General: Skin is warm and dry. +scarring on neck from prior trach, healed. +2X R forearm Ivs.  MSK: Severe ulnar deviation of fingers in L hand d/t RA; less pronounced on R hand.    Neurological:     Mental Status: He is alert.     Comments: Oriented to self, place,      RUE 3/5 shoulder abduction, 4-/5 elbow flexion, 3+/5 elbow extension, 2+/5 wrist extension, 1-/5 finger abduction, 2+/5 grip RLE 4-/5 hip flexion, 5-/5 knee extension, 5/5 ankle DF, PF.  Assessment/Plan: 1. Functional deficits which require 3+ hours per day of interdisciplinary therapy in a comprehensive inpatient rehab setting. Physiatrist is providing close team supervision and 24 hour management of active medical problems listed below. Physiatrist and rehab team continue to assess barriers to  discharge/monitor patient progress toward functional and medical goals  Care Tool:  Bathing    Body parts bathed by patient: Right arm, Chest, Abdomen, Right upper leg, Left upper leg, Face   Body parts bathed by helper: Left arm, Left lower leg, Right lower leg, Front perineal area, Buttocks     Bathing assist Assist Level: Moderate Assistance - Patient 50 - 74%     Upper Body Dressing/Undressing Upper body dressing   What is the patient wearing?: Pull over shirt    Upper body assist Assist Level: Moderate Assistance - Patient 50 - 74%    Lower Body Dressing/Undressing Lower body dressing      What is the patient wearing?: Incontinence brief, Pants     Lower body assist Assist for lower body dressing: Maximal Assistance - Patient 25 - 49%     Toileting Toileting    Toileting assist Assist for toileting: Maximal Assistance - Patient 25 - 49%     Transfers Chair/bed transfer  Transfers assist  Chair/bed transfer activity did not occur: Safety/medical concerns  Chair/bed transfer assist level: Minimal Assistance - Patient > 75%     Locomotion Ambulation   Ambulation assist   Ambulation activity did not occur: Safety/medical concerns          Walk 10 feet activity   Assist  Walk 10 feet activity did not occur: Safety/medical concerns        Walk 50 feet activity   Assist Walk 50 feet with 2 turns activity did not occur: Safety/medical concerns         Walk 150 feet activity   Assist Walk 150 feet activity did not occur: Safety/medical concerns         Walk 10 feet on uneven surface  activity   Assist Walk 10 feet on uneven surfaces activity did not occur: Safety/medical concerns         Wheelchair     Assist Is the patient using a wheelchair?: Yes Type of Wheelchair: Manual Wheelchair activity did not occur: Safety/medical concerns         Wheelchair 50 feet with 2 turns activity    Assist    Wheelchair 50  feet with 2 turns activity did not occur: Safety/medical concerns       Wheelchair 150 feet activity     Assist  Wheelchair 150 feet activity did not occur: Safety/medical concerns       Blood pressure (!) 109/48, pulse 73, temperature 98.3 F (36.8 C), temperature source Oral, resp. rate 18, height '6\' 2"'$  (1.88 m), weight 73.5 kg, SpO2 100 %.    Medical Problem List and Plan: 1. 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  Antithrombotics: -DVT/anticoagulation:  Mechanical: Sequential compression devices, below knee Bilateral lower extremities due to recurrent GIB             -antiplatelet therapy:  Brilinta and ASA 3. 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 Mood/Behavior/Sleep: LCSW to follow for evaluation and support.              -antipsychotic agents: N/A 5. Neuropsych/cognition: This patient is not fully capable of making decisions on his own behalf. 6. Skin/Wound Care: Routine pressure relief. Continue tube feeds             --Question patient awareness/continence.   --Will d/c in am and start toileting program.  7. Fluids/Electrolytes/Nutrition: Continue NPO with tube feeds.              --he reports that he was cleared for ice by [facility]. Will reach out to confirm; NPO in interim given high aspiration risk. --On continuous tube feed-->will change CBGs to every 4 hours. .  8. Left carotid stent: Continue ASA/Brilinta.  9. 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 Resume PPI but not able to crush for tube, pharmacy no longer has suspension   Would do 12wks of PPI, not able to cruch pantoprazole , pt wanted IV removed due to discomfort . Will try nexium cap via tube  May d/c famotidine when PPI available    Latest Ref Rng & Units 01/07/2023    6:08 AM 01/05/2023    3:58 AM 01/04/2023     4:55 AM  CBC  WBC 4.0 - 10.5 K/uL 9.0  6.9  5.7   Hemoglobin 13.0 - 17.0 g/dL 9.0  8.5  8.3   Hematocrit 39.0 - 52.0 % 27.8  25.8  26.0   Platelets 150 - 400 K/uL 232  142  150      11. Thrombocytopenia:Continue to monitor as have ranged187-->94-->142.             --monitor for signs of bleeding.  12. Aspiration PNA/Pneumonitis: On stress dose steriods D# 4/5.              -- 1/11 CXR 2 view w/ongoing frothy cough -> Decreased left-greater-than-right basilar opacities, likely resolving aspiration. Recommend follow-up chest x-ray in 3-4 weeks to ensure complete resolution.             --encourage flutter valve every 4 hrs WA.   13. T2DM: Hgb A1c-6.2. Monitor CBGs every 4 hours and use SSI for elevated BS             --on Prednisone to taper off in 24 hours. Monitor for improvement off this.       Recent Labs    01/06/23 0344 01/06/23 0808 01/06/23 1145  GLUCAP 169* 225* 116*   CBGs >200: d/c feeding supplement   14.Influenza A: Completed Tamiflu X 5 days 15. RA: Was on monthly Orenzia w/ prednisone to 1 mg daily as well as methotrexate and tadalafil. CXR reviewed and shows resolving infiltrate.              --taper to home dose 01/13.  16. Code status: continue Full code per palliative care notes 12/15/22.   17.  Dysphagia - chronic PTA, s/p G tube, NPO  LOS: 4 days A FACE TO FACE EVALUATION WAS PERFORMED  Charlett Blake 01/10/2023, 9:11 AM

## 2023-01-10 NOTE — Progress Notes (Signed)
Physical Therapy Session Note  Patient Details  Name: [name] MRN: [id number] Date of Birth: May 07, 1946  Today's Date: 01/10/2023 PT Individual Time: 1430-1515 PT Individual Time Calculation (min): 45 min   Short Term Goals: Week 1:  PT Short Term Goal 1 (Week 1): Pt will complete bed mobility with supervision without hospital bed features PT Short Term Goal 2 (Week 1): Pt will complete bed<>chair transfers with CGA and LRAD PT Short Term Goal 3 (Week 1): Pt will ambulate 141f with CGA and LRAD PT Short Term Goal 4 (Week 1): Pt will initiate stair training  Skilled Therapeutic Interventions/Progress Updates:      Pt sitting in recliner to start - agreeable to unscheduled therapy for m/u minutes. Denies pain - has thigh high compression and abdominal binder on.   Stand<>pivot transfer with CGA/minA and no AD from recliner to w/c. Transported to day room rehab gym for time management.   Short distance gait training, ~568f+ ~3552f ~51f6fth minA and no AD - B knee's flexed in stance with flexed hips as well - guarded position, decreased speed, and forward lean. Gait distances limited by fatigue.   Assisted on Nustep and completed 8 minutes at L6 resistance, using BLE and LUE. Targeting cardiovascular endurance and activity tolerance. Needed several brief rest breaks throughout task due to fatigue.   Pt requesting to return to room due to fatigue and feeling cold. Ambulatory transfer from w/c to recliner with minA and no AD. Safety belt alarm on, call bell in lap, yaunker suction in lap, and blankets provided. All needs met at end of session   Therapy Documentation Precautions:  Precautions Precautions: Fall Precaution Comments: JG tube, NPO, hypotension Restrictions Weight Bearing Restrictions: No General:     Therapy/Group: Individual Therapy  Timberlee Roblero P Kwasi Joung PT 01/10/2023, 3:00 PM

## 2023-01-10 NOTE — Progress Notes (Signed)
Physical Therapy Session Note  Patient Details  Name: [name] MRN: [id number] Date of Birth: [birth year]  Today's Date: 01/10/2023 PT Individual Time: 0900-0956 PT Individual Time Calculation (min): 56 min   Short Term Goals: Week 1:  PT Short Term Goal 1 (Week 1): Pt will complete bed mobility with supervision without hospital bed features PT Short Term Goal 2 (Week 1): Pt will complete bed<>chair transfers with CGA and LRAD PT Short Term Goal 3 (Week 1): Pt will ambulate 172f with CGA and LRAD PT Short Term Goal 4 (Week 1): Pt will initiate stair training  Skilled Therapeutic Interventions/Progress Updates:      Pt supine in bed and in agreement to therapy session. Denies pain. Donned thigh-high compression socks with totalA. No abdominal binder at bedside even though there is MD order - communicated with Nurse Secretary to order.  Supine<>sitting EOB with supervision with hospital bed features. Pt listing to the R and needs cues for correcting and fixing BOS to improve stability.   BP supine: 116/50 BP sitting; 98/57 BP standing: 89/49 *pt symptomatic with limited standing tolerance.   Transported to main rehab gym for time. Sit<>stands to RW from w/c with CGA with cues for hand placement and increasing forward weight shift. Short distance gait training ~273fwith CGA and RW - downward gaze, slow speed, and decreased stride length. Gait distance limited by fatigue and likely, hypotension.   Pt requesting to lie down due to feeling faint. Sit>supine on mat table without assist.   Supine there-ex completed as follows: -2x10 bridges -1x10 SLR, bilaterally -1x10 heel slides, bilaterally - 1x10 hip abduction/adduction, bilaterally  Supine to sitting on flat mat table with supervision, effortful but able. Stand<>Pivot transfer with RW and CGA from mat table to w/c, cues for sfaety approach and hand placement. Returned to his room and instructed in ambulatory transfer with  CGA/minA and RW. No assist for returning to supine. All needs met, yaunker in hand, call bell in lap.   *Pt with several productive coughs during session, emptying clear/thick secretions.    Therapy Documentation Precautions:  Precautions Precautions: Fall Precaution Comments: JG tube, NPO, hypotension Restrictions Weight Bearing Restrictions: No General:     Therapy/Group: Individual Therapy  Aevah Stansbery P Lametria Klunk PT 01/10/2023, 9:30 AM

## 2023-01-10 NOTE — Progress Notes (Signed)
Occupational Therapy Session Note  Patient Details  Name: [name] MRN: [id number] Date of Birth: 1946-10-13  Today's Date: 01/10/2023 OT Individual Time: 4825-0037 OT Individual Time Calculation (min): 45 min    Short Term Goals: Week 1:  OT Short Term Goal 1 (Week 1): Patient will complete shower trasnfer with min assist OT Short Term Goal 2 (Week 1): Patient will shower with min assist OT Short Term Goal 3 (Week 1): Patient will dress upper body with min assist OT Short Term Goal 4 (Week 1): Patient will demonstrate 30* of active wrist extension without increase in pain symptoms  Skilled Therapeutic Interventions/Progress Updates:  Pt greeted seated in recliner, pt agreeable to OT intervention. Session focus on RUE AROM, R wrist ace wrapped to support wrist in neutral position. Worked on functional grasp and release with pt instructed to grasp wash cloth with RUE to facilitate improved digit coordination. Pt presents with ulnar deviation and decreased ability to extend digits, ? Radial nerve palsy. Pt was able to grasp wash cloth with assist to fully extend digits and then transfer wash cloth on opposite side of LUE.   Graded task up and had pt complete same motor pattern but with compliant cube, decreased ability to fully extend digits over block needing assist. Attempted to work on grasping smaller shapes with pt having greatest difficulty with smaller items. Education provided on various compensatory grasps that could be utilized such as placing hand ontop of object and sliding object to edge of table to then place in pts L hand.   Worked on [employer] with R hand placed on compliant cube and pt instructed to sit>stands from recliner with an emphasis on [place] through [place] to stand. Pt completed stands with [place]. Pt does endorse dizziness with standing- BP assessed with ABD binder and thigh high teds donned.   108/88 ( 95) HR 86 Assisted pt with writing in memory book for SLP, Did not  recall what he did in earlier sessions to write in memory book ,unaware of year, date and day of week.   Ended session with pt seated in recliner, BLEs elevated  with all needs within reach and safety belt alarm activated.                     Therapy Documentation Precautions:  Precautions Precautions: Fall Precaution Comments: JG tube, NPO, hypotension Restrictions Weight Bearing Restrictions: No Pain: no pain    Therapy/Group: Individual Therapy  Corinne Ports [facility] 01/10/2023, 3:23 PM

## 2023-01-10 NOTE — Progress Notes (Signed)
Speech Language Pathology Daily Session Note  Patient Details  Name: [name] MRN: [id number] Date of Birth: [birth year]  Today's Date: 01/10/2023 SLP Individual Time: 0263-7858 SLP Individual Time Calculation (min): 45 min  Short Term Goals: Week 1: SLP Short Term Goal 1 (Week 1): Patient will consistently complete oral care via the suction toothbrush with Min verbal cues for set-up/frequency. SLP Short Term Goal 2 (Week 1): Patient will demonstrate sustained attention to functional tasks for 30 minutes with Min verbal cues for redirection. SLP Short Term Goal 3 (Week 1): Patient will demonstrate functional problem solving for basic and familiar tasks with Min verbal cues. SLP Short Term Goal 4 (Week 1): Patient will demonstrate recall of functional information with Mod verbal and visual cues.  Skilled Therapeutic Interventions:   Pt seen for skilled SLP session to address functional cognitive goals. Pt required min-mod cues to adequately sequence steps for thorough oral care. Addressed working memory and short term recall with review of memory books and appointment task. Working memory appointment task completed with 15-20% accuracy independently. Pt able to increase accuracy to 75-80% given multiple choice f=3. Completed verbal sequencing tasks of daily activities with ~75% accuracy given moderate verbal cues to progress pt through task. Pt often responded with "I don't know", until further prompts were given.   Recommend continue SLP intervention to address cognitive goals.   Pain Pain Assessment Pain Scale: 0-10 Pain Score: 0-No pain  Therapy/Group: Individual Therapy  Wyn Forster 01/10/2023, 11:30 AM

## 2023-01-10 NOTE — Progress Notes (Signed)
Occupational Therapy Session Note  Patient Details  Name: [name] MRN: [id number] Date of Birth: Jul 14, 1946  Today's Date: 01/10/2023 OT Individual Time: 2993-7169 OT Individual Time Calculation (min): 73 min    Short Term Goals: Week 1:  OT Short Term Goal 1 (Week 1): Patient will complete shower trasnfer with min assist OT Short Term Goal 2 (Week 1): Patient will shower with min assist OT Short Term Goal 3 (Week 1): Patient will dress upper body with min assist OT Short Term Goal 4 (Week 1): Patient will demonstrate 30* of active wrist extension without increase in pain symptoms  Skilled Therapeutic Interventions/Progress Updates:    Patient received supine in bed.  Patient reports feeling disgusted with himself and progress.  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  Reinforced that patient is making progress,  and encouraged him to try to focus on his health and rehab.   Abdominal binder arrived assisted patient to don while seated edge of bed.  Patient wearing thigh high ted hose.   Supine:  B 110/59 Seated 103/52  Minimal change with brief walk.   Patient having difficulty using RW due to limited grasp in right hand. Patient prefers to walk with one hand hold assist this session.  Walked ~ 35 feet, then 40 feet with seated rest in between - BP with minor changes. Patient left up in recliner to help him acclimate to out of bed.   Wrapped right wrist to discourage excessive flexion.  Worked on grasp/ release in right hand, as well as right wrist extension. Patient with improved ability to flex digits when wrist supported in neutral.  May benefit from wrist support.  Patient left up in recliner with safety belt in place and call bell/ personal items in reach.   Therapy Documentation Precautions:  Precautions Precautions: Fall Precaution Comments: JG tube, NPO, hypotension Restrictions Weight Bearing  Restrictions: No Pain: Pain Assessment Pain Scale: 0-10 Pain Score: 0-No pain     Therapy/Group: Individual Therapy  Mariah Milling 01/10/2023, 11:59 AM

## 2023-01-11 ENCOUNTER — Inpatient Hospital Stay (HOSPITAL_COMMUNITY): Payer: Medicare Other

## 2023-01-11 LAB — GLUCOSE, CAPILLARY
Glucose-Capillary: 108 mg/dL — ABNORMAL HIGH (ref 70–99)
Glucose-Capillary: 133 mg/dL — ABNORMAL HIGH (ref 70–99)
Glucose-Capillary: 146 mg/dL — ABNORMAL HIGH (ref 70–99)
Glucose-Capillary: 159 mg/dL — ABNORMAL HIGH (ref 70–99)
Glucose-Capillary: 161 mg/dL — ABNORMAL HIGH (ref 70–99)
Glucose-Capillary: 199 mg/dL — ABNORMAL HIGH (ref 70–99)
Glucose-Capillary: 205 mg/dL — ABNORMAL HIGH (ref 70–99)

## 2023-01-11 MED ORDER — MODAFINIL 100 MG PO TABS
100.0000 mg | ORAL_TABLET | Freq: Every day | ORAL | Status: DC
  Administered 2023-01-12: 100 mg via ORAL
  Filled 2023-01-11: qty 1

## 2023-01-11 MED ORDER — MIDODRINE HCL 5 MG PO TABS
2.5000 mg | ORAL_TABLET | Freq: Every day | ORAL | Status: DC
  Administered 2023-01-11 – 2023-01-12 (×2): 2.5 mg via ORAL
  Filled 2023-01-11 (×2): qty 1

## 2023-01-11 MED ORDER — MODAFINIL 100 MG PO TABS: 100.0000 mg | ORAL_TABLET | Freq: Every day | ORAL | Status: DC

## 2023-01-11 MED ORDER — SCOPOLAMINE 1 MG/3DAYS TD PT72
1.0000 | MEDICATED_PATCH | TRANSDERMAL | Status: DC
  Administered 2023-01-11 – 2023-01-14 (×2): 1.5 mg via TRANSDERMAL
  Filled 2023-01-11 (×2): qty 1

## 2023-01-11 NOTE — Progress Notes (Signed)
PROGRESS NOTE   Subjective/Complaints: Magnesium was low yesterday, Pam added mag ox Hypotensive today, will add midodrine in the AM  Ros: denies CP, SOB, N/V/D Objective:   No results found. No results for input(s): "WBC", "HGB", "HCT", "PLT" in the last 72 hours.  Recent Labs    01/10/23 0635  NA 134*  K 4.2  CL 102  CO2 23  GLUCOSE 173*  BUN 28*  CREATININE 0.81  CALCIUM 8.4*    Intake/Output Summary (Last 24 hours) at 01/11/2023 1049 Last data filed at 01/11/2023 0813 Gross per 24 hour  Intake 11227.09 ml  Output 525 ml  Net 10702.09 ml        Physical Exam: Vital Signs Blood pressure (!) 108/50, pulse 81, temperature 98 F (36.7 C), resp. rate 17, height '6\' 2"'$  (1.88 m), weight 71.3 kg, SpO2 96 %. Constitutional:      Appearance: BMI 20.18. He is ill-appearing.     Comments: Sitting in bed with multiple cups at bedside--one with ice chips, one w/water  Cardiac: RRR no rubs , Murmurs or ES  Resp: CTAB. On RA. No cough or resp distress  Abdominal:     Comments: + PEG tube no tenderness, stoma is CDI      General: Skin is warm and dry. +scarring on neck from prior trach, healed. +2X R forearm Ivs.  MSK: Severe ulnar deviation of fingers in L hand d/t RA; less pronounced on R hand.    Neurological:     Mental Status: He is alert.     Comments: Oriented to self, place,      RUE 3/5 shoulder abduction, 4-/5 elbow flexion, 3+/5 elbow extension, 2+/5 wrist extension, 1-/5 finger abduction, 2+/5 grip RLE 4-/5 hip flexion, 5-/5 knee extension, 5/5 ankle DF, PF. Ambulating MinA without AD  Assessment/Plan: 1. Functional deficits which require 3+ hours per day of interdisciplinary therapy in a comprehensive inpatient rehab setting. Physiatrist is providing close team supervision and 24 hour management of active medical problems listed below. Physiatrist and rehab team continue to assess barriers to  discharge/monitor patient progress toward functional and medical goals  Care Tool:  Bathing    Body parts bathed by patient: Right arm, Chest, Abdomen, Right upper leg, Left upper leg, Face   Body parts bathed by helper: Left arm, Left lower leg, Right lower leg, Front perineal area, Buttocks     Bathing assist Assist Level: Moderate Assistance - Patient 50 - 74%     Upper Body Dressing/Undressing Upper body dressing   What is the patient wearing?: Pull over shirt    Upper body assist Assist Level: Moderate Assistance - Patient 50 - 74%    Lower Body Dressing/Undressing Lower body dressing      What is the patient wearing?: Incontinence brief, Pants     Lower body assist Assist for lower body dressing: Maximal Assistance - Patient 25 - 49%     Toileting Toileting    Toileting assist Assist for toileting: Maximal Assistance - Patient 25 - 49%     Transfers Chair/bed transfer  Transfers assist  Chair/bed transfer activity did not occur: Safety/medical concerns  Chair/bed transfer assist level: Minimal Assistance -  Patient > 75%     Locomotion Ambulation   Ambulation assist   Ambulation activity did not occur: Safety/medical concerns          Walk 10 feet activity   Assist  Walk 10 feet activity did not occur: Safety/medical concerns        Walk 50 feet activity   Assist Walk 50 feet with 2 turns activity did not occur: Safety/medical concerns         Walk 150 feet activity   Assist Walk 150 feet activity did not occur: Safety/medical concerns         Walk 10 feet on uneven surface  activity   Assist Walk 10 feet on uneven surfaces activity did not occur: Safety/medical concerns         Wheelchair     Assist Is the patient using a wheelchair?: Yes Type of Wheelchair: Manual Wheelchair activity did not occur: Safety/medical concerns         Wheelchair 50 feet with 2 turns activity    Assist    Wheelchair 50  feet with 2 turns activity did not occur: Safety/medical concerns       Wheelchair 150 feet activity     Assist  Wheelchair 150 feet activity did not occur: Safety/medical concerns       Blood pressure (!) 108/50, pulse 81, temperature 98 F (36.7 C), resp. rate 17, height '6\' 2"'$  (1.88 m), weight 71.3 kg, SpO2 96 %.    Medical Problem List and Plan: 1. 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  Antithrombotics: -DVT/anticoagulation:  Mechanical: Sequential compression devices, below knee Bilateral lower extremities due to recurrent GIB             -antiplatelet therapy:  Brilinta and ASA 3. 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 Mood/Behavior/Sleep: LCSW to follow for evaluation and support.              -antipsychotic agents: N/A 5. Neuropsych/cognition: This patient is not fully capable of making decisions on his own behalf. 6. Skin/Wound Care: Routine pressure relief. Continue tube feeds             --Question patient awareness/continence.   --Will d/c in am and start toileting program.  7. Fluids/Electrolytes/Nutrition: Continue NPO with tube feeds.              --he reports that he was cleared for ice by [facility]. Will reach out to confirm; NPO in interim given high aspiration risk. --On continuous tube feed-->will change CBGs to every 4 hours. .  8. Left carotid stent: Continue ASA/Brilinta.  9. 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 Resume PPI but not able to crush for tube, pharmacy no longer has suspension   Would do 12wks of PPI, not able to cruch pantoprazole , pt wanted IV removed due to discomfort . Will try nexium cap via tube  May d/c famotidine when PPI available    Latest Ref Rng & Units 01/07/2023    6:08 AM 01/05/2023    3:58 AM 01/04/2023    4:55 AM   CBC  WBC 4.0 - 10.5 K/uL 9.0  6.9  5.7   Hemoglobin 13.0 - 17.0 g/dL 9.0  8.5  8.3   Hematocrit 39.0 - 52.0 % 27.8  25.8  26.0   Platelets 150 - 400 K/uL 232  142  150      11. Thrombocytopenia:Continue to monitor as have ranged187-->94-->142.             --monitor for signs of bleeding.  12. Aspiration PNA/Pneumonitis: On stress dose steriods D# 4/5.              -- 1/11 CXR 2 view w/ongoing frothy cough -> Decreased left-greater-than-right basilar opacities, likely resolving aspiration. Recommend follow-up chest x-ray in 3-4 weeks to ensure complete resolution.             --encourage flutter valve every 4 hrs WA.   13. T2DM: Hgb A1c-6.2. Monitor CBGs every 4 hours and use SSI for elevated BS             --on Prednisone to taper off in 24 hours. Monitor for improvement off this.       Recent Labs    01/06/23 0344 01/06/23 0808 01/06/23 1145  GLUCAP 169* 225* 116*   CBGs >200: d/c feeding supplement   14.Influenza A: Completed Tamiflu X 5 days 15. RA: Was on monthly Orenzia w/ prednisone to 1 mg daily as well as methotrexate and tadalafil. CXR reviewed and shows resolving infiltrate.              --taper to home dose 01/13.  16. Code status: continue Full code per palliative care notes 12/15/22.   17.  Dysphagia - chronic PTA, s/p G tube, NPO, continue tube feeds as per RD recommendations.  18. Hypotension: add midodrine 2.'5mg'$  daily 19. Severe magnesium deficiency: oral supplements per tube added.   LOS: 5 days A FACE TO FACE EVALUATION WAS PERFORMED  Clide Deutscher Nyonna Hargrove 01/11/2023, 10:49 AM

## 2023-01-11 NOTE — Progress Notes (Signed)
Occupational Therapy Session Note  Patient Details  Name: [name] MRN: [id number] Date of Birth: 03-31-1946  Today's Date: 01/11/2023 OT Individual Time: 1300-1330 OT Individual Time Calculation (min): 30 min    Short Term Goals: Week 1:  OT Short Term Goal 1 (Week 1): Patient will complete shower trasnfer with min assist OT Short Term Goal 2 (Week 1): Patient will shower with min assist OT Short Term Goal 3 (Week 1): Patient will dress upper body with min assist OT Short Term Goal 4 (Week 1): Patient will demonstrate 30* of active wrist extension without increase in pain symptoms  Skilled Therapeutic Interventions/Progress Updates:    Patient received supine in bed sleeping.  Patient slow to arouse.  Patient unable to state he was in [facility] - initially stated [facility], then [facility].  Patient continues with significant drop in BP with positional changes.   Supine:  133/55 Seated:  100/53 Standing:  83/38  Patient reporting feeling "Fangy" = although unable to elaborate.  Patient having difficulty maintaing arousal. Have made change to spread his therapy out over course of 7 days to allow adequate rest daily. Patient left in bed - supine with bed alarm on, suction and call bell/ personal items in reach.    Therapy Documentation Precautions:  Precautions Precautions: Fall Precaution Comments: JG tube, NPO, hypotension Restrictions Weight Bearing Restrictions: No General: General OT Amount of Missed Time: 45 Minutes Vital Signs: Therapy Vitals BP: (!) 133/55 Patient Position (if appropriate): Lying Pain:  Headache - unable to rate pain - Nursing alerted patient requesting pain meds   Therapy/Group: Individual Therapy  Mariah Milling 01/11/2023, 1:32 PM

## 2023-01-11 NOTE — Progress Notes (Signed)
Contacted Dr. Atlee Abide office [phone]) and spoke to clinical staff who reviewed chart notes but was not able to provide information on patient's prior diet or restrictions. She will discuss with MD and call back with input.

## 2023-01-11 NOTE — Progress Notes (Signed)
Physical Therapy Session Note  Patient Details  Name: [name] MRN: [id number] Date of Birth: 1946/01/12  Today's Date: 01/11/2023 PT Individual Time: 4665-9935 + 7017-7939 PT Individual Time Calculation (min): 45 min  + 42 min  Short Term Goals: Week 1:  PT Short Term Goal 1 (Week 1): Pt will complete bed mobility with supervision without hospital bed features PT Short Term Goal 2 (Week 1): Pt will complete bed<>chair transfers with CGA and LRAD PT Short Term Goal 3 (Week 1): Pt will ambulate 176f with CGA and LRAD PT Short Term Goal 4 (Week 1): Pt will initiate stair training  Skilled Therapeutic Interventions/Progress Updates:      1st session: Pt supine in bed - agreeable to therapy. Denies pain.   Assisted with dressing as patient in hospital gown only. Donned thigh-high TED's with totalA. Needed maxA for donning pants at bed level. TotalA for donning abdominal binder.   Supine<>sitting EOB with supervision but patient losing his balance posteriorly and to the R when he sit's unsupported at EOB. Multiple LOB while donning long-sleeve jacket and when donning slip-on shoes. Unable to self correct.   Stand<>pivot transfer with minA and no AD to w/c with patient losing his balance anteriorly. Transported to main rehab gym.  BP sitting: 108/53 BP standing: 81/52 *pt symptomatic. Notified care team via secure chat.   Sit<>Stands from w/c with minA and no AD - cues for hand placement and correcting balance on initial standing. Gait training short distances due to hypotension. ~322f+ 3570fith minA and no AD - cues for slowing down speed, improving awareness for R step length and for general safety. Pt rushing and quick to move, could be to his hypotension.   Returned to his room and assisted to recliner to promote upright tolerance. BLE elevated, safety belt alarm on, call bell and yaunker in reach.   2nd session: Pt resting in bed but agreeable to therapy session. Flat  affect, no reports no pain. Needs some encouragement to participate.  Supine<>sitting EOB with supervision, effortful. No LOB while sitting EOB like this AM session. Stand<>pivot transfer with minA from EOB to w/c, cues for hand placement and sequencing.   Transported to main rehab gym and assisted with donning shoes with totalA. Sit<>stand with minA and no AD - needs assist for stabilizing in standing due to forward LOB. Gait training [id number] minA and no AD - frequently catching both feet during swing phase. Needs cues for slowing down pace/speed as he tries to rush himself.   Instructed in stair training using 6inch steps and 2 hand rails. Pt needing minA overall for safely going up/down - cues for safety awareness as he rush's task. Difficulty gripping hand rail on R due to wrist drop. Able to go up/down 4 + 4 steps (seated rest).   Completed Kinetron at wheelchair level. Resistance set to 55cm/sec. Completed for 8 minutes with cues for full ROM bilaterally.   Returned to room and assisted patient with ambulatory transfer to recliner with minA.Butlerety belt alarm on, BLE elevated, friend at the bedside. Call bell in lap.  Therapy Documentation Precautions:  Precautions Precautions: Fall Precaution Comments: JG tube, NPO, hypotension Restrictions Weight Bearing Restrictions: No General:      Therapy/Group: Individual Therapy  Mannat Benedetti P Rosielee Corporan PT 01/11/2023, 7:31 AM

## 2023-01-11 NOTE — Progress Notes (Signed)
Occupational Therapy Session Note  Patient Details  Name: [name] MRN: [id number] Date of Birth: 1946-02-28  Today's Date: 01/11/2023 OT Individual Time: 1100-1135 OT Individual Time Calculation (min): 35 min    Short Term Goals: Week 1:  OT Short Term Goal 1 (Week 1): Patient will complete shower trasnfer with min assist OT Short Term Goal 2 (Week 1): Patient will shower with min assist OT Short Term Goal 3 (Week 1): Patient will dress upper body with min assist OT Short Term Goal 4 (Week 1): Patient will demonstrate 30* of active wrist extension without increase in pain symptoms  Skilled Therapeutic Interventions/Progress Updates:   Patient received seated in recliner - sliding forward, sleeping. Patient with swelling on dorsum of right hand - ulnar aspect.  Patient's hand unwrapped from ace wrap.  Red area over distal ulna. Communicated to MD.  Provided edema massage and worked on wrist extension, then digit flex/extension.  Patient with limited MCP extension - developing PIP extension.  Patient unable to remain awake this session - assisted back to bed to allow him to rest prior to afternoon sessions.  Patient may benefit from less intense schedule over 7 day/week.    Therapy Documentation Precautions:  Precautions Precautions: Fall Precaution Comments: JG tube, NPO, hypotension Restrictions Weight Bearing Restrictions: No General:   Vital Signs: 111/52 Seated in recliner with Teds and Abdominal Binder Pain: Pain Assessment Pain Scale: 0-10 Pain Score: 0-No pain    Therapy/Group: Individual Therapy  Mariah Milling 01/11/2023, 11:42 AM

## 2023-01-12 LAB — GLUCOSE, CAPILLARY
Glucose-Capillary: 115 mg/dL — ABNORMAL HIGH (ref 70–99)
Glucose-Capillary: 130 mg/dL — ABNORMAL HIGH (ref 70–99)
Glucose-Capillary: 162 mg/dL — ABNORMAL HIGH (ref 70–99)
Glucose-Capillary: 182 mg/dL — ABNORMAL HIGH (ref 70–99)
Glucose-Capillary: 183 mg/dL — ABNORMAL HIGH (ref 70–99)
Glucose-Capillary: 186 mg/dL — ABNORMAL HIGH (ref 70–99)

## 2023-01-12 MED ORDER — DIPHENHYDRAMINE HCL 12.5 MG/5ML PO ELIX: 12.5000 mg | ORAL_SOLUTION | Freq: Four times a day (QID) | ORAL | Status: DC | PRN

## 2023-01-12 MED ORDER — FREE WATER
150.0000 mL | Status: DC
  Administered 2023-01-12 – 2023-01-15 (×18): 150 mL

## 2023-01-12 MED ORDER — MIDODRINE HCL 5 MG PO TABS: 5.0000 mg | ORAL_TABLET | Freq: Every day | ORAL | Status: DC

## 2023-01-12 MED ORDER — LOPERAMIDE HCL 1 MG/7.5ML PO SUSP: 2.0000 mg | ORAL | Status: DC | PRN

## 2023-01-12 MED ORDER — POLYETHYLENE GLYCOL 3350 17 G PO PACK
17.0000 g | PACK | Freq: Every day | ORAL | Status: DC | PRN
  Administered 2023-01-13 – 2023-01-14 (×2): 17 g
  Filled 2023-01-12 (×2): qty 1

## 2023-01-12 MED ORDER — MODAFINIL 100 MG PO TABS
100.0000 mg | ORAL_TABLET | Freq: Every day | ORAL | Status: DC
  Administered 2023-01-13: 100 mg
  Filled 2023-01-12: qty 1

## 2023-01-12 MED ORDER — PROCHLORPERAZINE EDISYLATE 10 MG/2ML IJ SOLN
5.0000 mg | Freq: Four times a day (QID) | INTRAMUSCULAR | Status: DC | PRN
  Administered 2023-01-15: 10 mg via INTRAMUSCULAR
  Filled 2023-01-12: qty 2

## 2023-01-12 MED ORDER — TRAZODONE HCL 50 MG PO TABS
25.0000 mg | ORAL_TABLET | Freq: Every evening | ORAL | Status: DC | PRN
  Administered 2023-01-12 – 2023-01-13 (×2): 50 mg
  Filled 2023-01-12 (×2): qty 1

## 2023-01-12 MED ORDER — PROCHLORPERAZINE 25 MG RE SUPP
12.5000 mg | Freq: Four times a day (QID) | RECTAL | Status: DC | PRN
  Filled 2023-01-12: qty 1

## 2023-01-12 MED ORDER — ACETAMINOPHEN 325 MG PO TABS
325.0000 mg | ORAL_TABLET | ORAL | Status: DC | PRN
  Administered 2023-01-12 – 2023-01-13 (×2): 650 mg
  Filled 2023-01-12 (×3): qty 2

## 2023-01-12 MED ORDER — PROCHLORPERAZINE MALEATE 5 MG PO TABS: 5.0000 mg | ORAL_TABLET | Freq: Four times a day (QID) | ORAL | Status: DC | PRN

## 2023-01-12 MED ORDER — ALUM & MAG HYDROXIDE-SIMETH 200-200-20 MG/5ML PO SUSP: 30.0000 mL | ORAL | Status: DC | PRN

## 2023-01-12 MED ORDER — GUAIFENESIN-DM 100-10 MG/5ML PO SYRP
5.0000 mL | ORAL_SOLUTION | Freq: Four times a day (QID) | ORAL | Status: DC | PRN
  Administered 2023-01-12: 10 mL
  Filled 2023-01-12: qty 10

## 2023-01-12 NOTE — Progress Notes (Signed)
Orthopedic Tech Progress Note Patient Details:  [name] [birth year] [id number] Right velcro wrist brace was dropped off at bedside earlier this morning.  Ortho Devices Type of Ortho Device: Velcro wrist splint Ortho Device/Splint Location: Right wrist Ortho Device/Splint Interventions: Ordered      Riese Hellard E Sharleen Szczesny 01/12/2023, 2:51 PM

## 2023-01-12 NOTE — Progress Notes (Signed)
Speech Language Pathology Daily Session Note  Patient Details  Name: [name] MRN: [id number] Date of Birth: 26-Apr-1946  Today's Date: 01/12/2023 SLP Individual Time: 1300-1345 SLP Individual Time Calculation (min): 45 min  Short Term Goals: Week 1: SLP Short Term Goal 1 (Week 1): Patient will consistently complete oral care via the suction toothbrush with Min verbal cues for set-up/frequency. SLP Short Term Goal 2 (Week 1): Patient will demonstrate sustained attention to functional tasks for 30 minutes with Min verbal cues for redirection. SLP Short Term Goal 3 (Week 1): Patient will demonstrate functional problem solving for basic and familiar tasks with Min verbal cues. SLP Short Term Goal 4 (Week 1): Patient will demonstrate recall of functional information with Mod verbal and visual cues.  Skilled Therapeutic Interventions: Pt seen this date for skilled ST intervention targeting cognitive goals outlined in care plan. Pt received awake/alert and slouched in recliner chair. Requesting to get back in back; transferred with Min A stand pivot. Agreeable to intervention at bedside. Asking questions re: R wrist and imaging completed yesterday; deferred to MD.   Today's session with emphasis on functional cognitive skill training within the context of ADL and IADL tasks. Pt completed oral care via suction toothbrush with overall Min-Mod A for thoroughness. Required Min-Mod A verbal A for redirection throughout. Completed 4-step sequencing task pertaining to ADL completion with 75% accuracy given Min A verbal cues. At the end of today's session, SLP wrote in pt's memory notebook to facilitate delayed recall of today's session. Additionally, SLP provided education re: the importance of oral care QID in the setting of NPO and increased aspiration risk, in addition to purpose of memory notebook and use of external aids; he verbalized understanding though will need reinforcement.  Pt left in  bed with all safety measures activated and call bell within reach. Continue per current ST POC.   Pain No pain reported; NAD  Therapy/Group: Individual Therapy  Veryl Winemiller A Gordy Goar 01/12/2023, 3:26 PM

## 2023-01-12 NOTE — Progress Notes (Signed)
PROGRESS NOTE   Subjective/Complaints: Will ask RD to adjust tube feeds to bolus Right wrist is somewhat painful Scopolamine patch ordered for secretions Hypotensive with dizziness  Ros: denies CP, SOB, N/V/D, +dizziness Objective:   CT WRIST RIGHT WO CONTRAST  Result Date: 01/11/2023 CLINICAL DATA:  Radial nerve palsy. EXAM: CT OF THE RIGHT WRIST WITHOUT CONTRAST TECHNIQUE: Multidetector CT imaging of the right wrist was performed according to the standard protocol. Multiplanar CT image reconstructions were also generated. RADIATION DOSE REDUCTION: This exam was performed according to the departmental dose-optimization program which includes automated exposure control, adjustment of the mA and/or kV according to patient size and/or use of iterative reconstruction technique. COMPARISON:  Radiographs dated January 07, 2023 FINDINGS: Bones/Joint/Cartilage No evidence of fracture or osteonecrosis distal radius and ulna are intact carpal bones are intact and in normal anatomical alignment. Mild degenerative changes of the first carpometacarpal joint with joint space narrowing and marginal spurring there are advanced arthritic changes of the first and second metacarpophalangeal joint with osseous erosion of the bases of the phalanges and head of the metacarpals there are degenerative changes of the third and fourth metatarsophalangeal joints. Ligaments Suboptimally assessed by CT. Muscles and Tendons Thenar and hypothenar muscles are normal in bulk. Flexor and extensor tendons are intact. Soft tissues Skin and subcutaneous soft tissues are within normal limits. No fluid collection or hematoma. No significant soft tissue swelling. IMPRESSION: 1. Advanced arthritic changes of the first and second metacarpophalangeal joints with osseous erosion of the bases of the phalanges and head of the metacarpals. 2. Mild degenerative changes of the first  carpometacarpal joint and third and fourth metatarsophalangeal joints. 3. Carpal bones are in normal alignment. Distal radius and ulna are unremarkable. No evidence of fracture or osteonecrosis. Electronically Signed   By: Keane Police D.O.   On: 01/11/2023 22:42   No results for input(s): "WBC", "HGB", "HCT", "PLT" in the last 72 hours.  Recent Labs    01/10/23 0635  NA 134*  K 4.2  CL 102  CO2 23  GLUCOSE 173*  BUN 28*  CREATININE 0.81  CALCIUM 8.4*    Intake/Output Summary (Last 24 hours) at 01/12/2023 1115 Last data filed at 01/12/2023 0027 Gross per 24 hour  Intake --  Output 700 ml  Net -700 ml        Physical Exam: Vital Signs Blood pressure (!) 98/50, pulse 81, temperature 97.9 F (36.6 C), temperature source Oral, resp. rate 16, height '6\' 2"'$  (1.88 m), weight 71.3 kg, SpO2 100 %. Constitutional:      Appearance: BMI 20.18. He is ill-appearing.     Comments: Sitting in bed with multiple cups at bedside--one with ice chips, one w/water  Cardiac: RRR no rubs , Murmurs or ES  Resp: CTAB. On RA. No cough or resp distress  Abdominal:     Comments: + PEG tube no tenderness, stoma is CDI      General: Skin is warm and dry. +scarring on neck from prior trach, healed. +2X R forearm Ivs.  MSK: Severe ulnar deviation of fingers in L hand d/t RA; less pronounced on R hand.    Neurological:  Mental Status: He is alert.     Comments: Oriented to self, place,      RUE 3/5 shoulder abduction, 4-/5 elbow flexion, 3+/5 elbow extension, 2+/5 wrist extension, 1-/5 finger abduction, 2+/5 grip RLE 4-/5 hip flexion, 5-/5 knee extension, 5/5 ankle DF, PF. Ambulating MinA without AD Right CMC joint TTP  Assessment/Plan: 1. Functional deficits which require 3+ hours per day of interdisciplinary therapy in a comprehensive inpatient rehab setting. Physiatrist is providing close team supervision and 24 hour management of active medical problems listed below. Physiatrist and rehab  team continue to assess barriers to discharge/monitor patient progress toward functional and medical goals  Care Tool:  Bathing    Body parts bathed by patient: Right arm, Chest, Abdomen, Right upper leg, Left upper leg, Face   Body parts bathed by helper: Left arm, Left lower leg, Right lower leg, Front perineal area, Buttocks     Bathing assist Assist Level: Moderate Assistance - Patient 50 - 74%     Upper Body Dressing/Undressing Upper body dressing   What is the patient wearing?: Pull over shirt    Upper body assist Assist Level: Moderate Assistance - Patient 50 - 74%    Lower Body Dressing/Undressing Lower body dressing      What is the patient wearing?: Incontinence brief, Pants     Lower body assist Assist for lower body dressing: Maximal Assistance - Patient 25 - 49%     Toileting Toileting    Toileting assist Assist for toileting: Maximal Assistance - Patient 25 - 49%     Transfers Chair/bed transfer  Transfers assist  Chair/bed transfer activity did not occur: Safety/medical concerns  Chair/bed transfer assist level: Minimal Assistance - Patient > 75%     Locomotion Ambulation   Ambulation assist   Ambulation activity did not occur: Safety/medical concerns          Walk 10 feet activity   Assist  Walk 10 feet activity did not occur: Safety/medical concerns        Walk 50 feet activity   Assist Walk 50 feet with 2 turns activity did not occur: Safety/medical concerns         Walk 150 feet activity   Assist Walk 150 feet activity did not occur: Safety/medical concerns         Walk 10 feet on uneven surface  activity   Assist Walk 10 feet on uneven surfaces activity did not occur: Safety/medical concerns         Wheelchair     Assist Is the patient using a wheelchair?: Yes Type of Wheelchair: Manual Wheelchair activity did not occur: Safety/medical concerns         Wheelchair 50 feet with 2 turns  activity    Assist    Wheelchair 50 feet with 2 turns activity did not occur: Safety/medical concerns       Wheelchair 150 feet activity     Assist  Wheelchair 150 feet activity did not occur: Safety/medical concerns       Blood pressure (!) 98/50, pulse 81, temperature 97.9 F (36.6 C), temperature source Oral, resp. rate 16, height '6\' 2"'$  (1.88 m), weight 71.3 kg, SpO2 100 %.    Medical Problem List and Plan: 1. 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  Antithrombotics: -DVT/anticoagulation:  Mechanical: Sequential compression devices, below knee Bilateral lower extremities due to recurrent GIB             -antiplatelet therapy:  Brilinta and ASA 3. 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 Mood/Behavior/Sleep: LCSW to follow for evaluation and support.              -antipsychotic agents: N/A 5. Neuropsych/cognition: This patient is not fully capable of making decisions on his own behalf. 6. Skin/Wound Care: Routine pressure relief. Continue tube feeds             --Question patient awareness/continence.   --Will d/c in am and start toileting program.  7. Fluids/Electrolytes/Nutrition: Continue NPO with tube feeds.              --he reports that he was cleared for ice by [facility]. Will reach out to confirm; NPO in interim given high aspiration risk. --On continuous tube feed-->will change CBGs to every 4 hours. .  8. Left carotid stent: Continue ASA/Brilinta.  9. 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 Resume PPI but not able to crush for tube, pharmacy no longer has suspension   Would do 12wks of PPI, not able to cruch pantoprazole , pt wanted IV removed due to discomfort . Will try nexium cap via tube  May d/c famotidine when PPI  available    Latest Ref Rng & Units 01/07/2023    6:08 AM 01/05/2023    3:58 AM 01/04/2023    4:55 AM  CBC  WBC 4.0 - 10.5 K/uL 9.0  6.9  5.7   Hemoglobin 13.0 - 17.0 g/dL 9.0  8.5  8.3   Hematocrit 39.0 - 52.0 % 27.8  25.8  26.0   Platelets 150 - 400 K/uL 232  142  150      11. Thrombocytopenia:Continue to monitor as have ranged187-->94-->142.             --monitor for signs of bleeding.  12. Aspiration PNA/Pneumonitis: On stress dose steriods D# 4/5.              -- 1/11 CXR 2 view w/ongoing frothy cough -> Decreased left-greater-than-right basilar opacities, likely resolving aspiration. Recommend follow-up chest x-ray in 3-4 weeks to ensure complete resolution.             --encourage flutter valve every 4 hrs WA.   13. T2DM: Hgb A1c-6.2. Monitor CBGs every 4 hours and use SSI for elevated BS             --on Prednisone to taper off in 24 hours. Monitor for improvement off this.       Recent Labs    01/06/23 0344 01/06/23 0808 01/06/23 1145  GLUCAP 169* 225* 116*   CBGs >200: d/c feeding supplement   14.Influenza A: Completed Tamiflu X 5 days 15. RA: Was on monthly Orenzia w/ prednisone to 1 mg daily as well as methotrexate and tadalafil. CXR reviewed and shows resolving infiltrate.              --taper to home dose 01/13.  16. Code status: continue Full code per palliative care notes 12/15/22.   17.  Dysphagia - chronic PTA, s/p G tube, NPO, continue tube feeds as per RD recommendations. Messaged RD to see if he can transition to bolus feeds.  18. Secretions: scopolamine patch ordered 19. Severe magnesium deficiency: oral supplements per tube added.  20. Hypotension: midodrine increased to '5mg'$   LOS: 6 days A FACE TO FACE EVALUATION WAS PERFORMED  Diyana Starrett P Muhammed Teutsch 01/12/2023, 11:15 AM

## 2023-01-12 NOTE — Patient Care Conference (Signed)
Inpatient RehabilitationTeam Conference and Plan of Care Update Date: 01/12/2023   Time: 11:10 AM    Patient Name: [name]      Medical Record Number: [id number]  Date of Birth: 11/13/1946 Sex: Male         Room/Bed: 4W16C/4W16C-01 Payor Info: Payor: MEDICARE / Plan: MEDICARE PART A AND B / Product Type: *No Product type* /    Admit Date/Time:  01/06/2023  3:59 PM  Primary Diagnosis:  Acute ischemic left middle cerebral artery (MCA) stroke [facility])  Hospital Problems: Principal Problem:   Acute ischemic left middle cerebral artery (MCA) stroke [facility]) Active Problems:   Dysphagia, pharyngoesophageal phase    Expected Discharge Date: Expected Discharge Date: 01/19/23  Team Members Present: Physician leading conference: Dr. Leeroy Cha Social Worker Present: Ovidio Kin, LCSW Nurse Present: Dorien Chihuahua, RN PT Present: Ginnie Smart, PT OT Present: Other (comment) Antony Salmon, OT) SLP Present: Weston Anna, SLP     Current Status/Progress Goal Weekly Team Focus  Bowel/Bladder     Patient voiding, constipation addressed    Continent of bowel and bladder     Toileting  Swallow/Nutrition/ Hydration   NPO, baseline history of chronic dysphagia with multiple episodes of aspiration PNA   N/A, awaiting info from care team at [place] "strict NPO"  oral care, secretion management    ADL's   max assist ADL Initially, Mod assist currently - unstable BP, low activity tolerance   Min assist/ supervision BADL   Activity tolerance, RUE functional use    Mobility   supervision bed mobility. CGA/minA sit<>stand. minA transfers. minA gait ~1100f with no AD. MinA stairs with 2 hand rails. Limited by hypotension   CGA to supervision  Endurance, activity tolerance, general strengthening, gait training, stairs, balance training    Communication                Safety/Cognition/ Behavioral Observations  Mod-Max A   Min A   selective attention, recall  with use of strategies, functional problem solving    Pain      N/A          Skin      PEG; TF 4p-8a daily   Maintain skin integrity    Assess skin q shift, keep skin clean and dry    Discharge Planning:  Hopefully home with ex-wife who is there but can not physically assist was helping with tube feeds. If unable to handle at home will need to go back to a SNF   Team Discussion: Patient with debility , chronic dysphagia and poor activity tolerance hindered by symptomatic orthostatic hypotension and memory deficits. Left wrist discomfort; CT noted arthritis.   Patient on target to meet rehab goals: Currently needs mod assist for ADLs and min assist for sit -stand and transfers. Able to ambulate up to 25' using a RW and min assist for steps. Goals for discharge set for supervision - min assist.  *See Care Plan and progress notes for long and short-term goals.   Revisions to Treatment Plan:  Downgraded goals to min assist and mod assist for cognitive goals Dietary consult for bolus daytime feeds Palliative Care consult Scopolamine patch  Abdominal binder, TTEDs Wrist splint  Teaching Needs: Safety, medications, skin care, nutritional means, transfers, toileting, etc.   Current Barriers to Discharge: Decreased caregiver support and Home enviroment access/layout  Possible Resolutions to Barriers: Family education     Medical Summary Current Status: right wrist pain and weakness, orthostatic hypotension with dizziness,  secretions, fatigue  Barriers to Discharge: Medical stability  Barriers to Discharge Comments: right wrist and weakness, orthostatic hypotension with dizzienss, secretions, fatigue Possible Resolutions to [employer] Focus: CT right wrist ordered and shows severe CMC arthritis, wrist splint ordered, continue to monitor blood pressure TID, increase modafinil to '5mg'$    Continued Need for Acute Rehabilitation Level of Care: The patient requires daily  medical management by a physician with specialized training in physical medicine and rehabilitation for the following reasons: Direction of a multidisciplinary physical rehabilitation program to maximize functional independence : Yes Medical management of patient stability for increased activity during participation in an intensive rehabilitation regime.: Yes Analysis of laboratory values and/or radiology reports with any subsequent need for medication adjustment and/or medical intervention. : Yes   I attest that I was present, lead the team conference, and concur with the assessment and plan of the team.   Dorien Chihuahua B 01/12/2023, 1:46 PM

## 2023-01-12 NOTE — Progress Notes (Signed)
Occupational Therapy Session Note  Patient Details  Name: [name] MRN: [id number] Date of Birth: 27-May-1946  Today's Date: 01/12/2023 OT Individual Time: 9563-8756 OT Individual Time Calculation (min): 57 min    Short Term Goals: Week 1:  OT Short Term Goal 1 (Week 1): Patient will complete shower trasnfer with min assist OT Short Term Goal 2 (Week 1): Patient will shower with min assist OT Short Term Goal 3 (Week 1): Patient will dress upper body with min assist OT Short Term Goal 4 (Week 1): Patient will demonstrate 30* of active wrist extension without increase in pain symptoms  Skilled Therapeutic Interventions/Progress Updates:    Patient awake in bed upon arrival.  Agreeable to get out of bed.  Patient able to use urinal in bed without assistance except to empty.  Patient sat at edge of bed, needing several seconds to manage static sitting once upright - falls toward right.  Working to help patient keep active base of support - feet on floor.   Transferred to wheelchair then to sink to complete bathing, dressing and hygiene.  Patient asking about right wrist - shared he now had wrist brace - applied brace and patient feels support is helpful.  Brace reduces excessive wrist flexion.   Patient with decreased BP with upright activity.  Patient left up in wheelchair with safety belt in place and engaged,  table in front and personal items/ call bell/ suction in reach.     Therapy Documentation Precautions:  Precautions Precautions: Fall Precaution Comments: JG tube, NPO, hypotension Restrictions Weight Bearing Restrictions: No  Vital Signs: Therapy Vitals Pulse Rate: 81 BP: (!) 98/50 Patient Position (if appropriate): Sitting (after 10 ft walk) Pain:  Denies pain   Therapy/Group: Individual Therapy  Mariah Milling 01/12/2023, 11:29 AM

## 2023-01-12 NOTE — Progress Notes (Signed)
Patient ID: [name], male   DOB: [birth year], 77 y.o.   MRN: [id number]  Spoke with son-[name] to give him a team conference update of goals of CGA-min level and target discharge 1/24. Son reports he is making some progress, he is hopeful he can get to supervision to be able to go home and not have to go back to a SNF. Made aware of BP issues. Discussed having Mom-[name] come in for education to see if she can provide the care and then that will answer the question of home versus SNF. Will call again Monday to see where we are and schedule education. MD did reach out to son also and discussed his medical issues and address son's questions. Work on discharge plan.

## 2023-01-12 NOTE — Progress Notes (Signed)
Physical Therapy Session Note  Patient Details  Name: [name] MRN: [id number] Date of Birth: [birth year]  Today's Date: 01/12/2023 PT Individual Time: 1015-1055 + 1415 -1440 PT Individual Time Calculation (min): 40 min  + 25 min  Short Term Goals: Week 1:  PT Short Term Goal 1 (Week 1): Pt will complete bed mobility with supervision without hospital bed features PT Short Term Goal 2 (Week 1): Pt will complete bed<>chair transfers with CGA and LRAD PT Short Term Goal 3 (Week 1): Pt will ambulate 187f with CGA and LRAD PT Short Term Goal 4 (Week 1): Pt will initiate stair training  Skilled Therapeutic Interventions/Progress Updates:      1st session: Pt sitting in w/c to start. Abdominal binder and thigh-high teds on. Patient denies pain, flat affect. Connected to feeding tube although feeding finished. RN called to disconnect to allow freedom during mobility.   Transported patient in w/c to day room rehab gym for time. Gait training during session without an AD, minA needed for stability. Ambulating variable distances, ~25-46f Gait distances limited by fatigue. Patient with frequent LOB forwards, tripping over his toes/feet and blames it on his shoes. Patient with poor safety awareness, trying to sit on chair with wheels.   Nustep completed x10 minutes at L3 resistance, using BLE to isolate for strengthening and cardiovascular endurance. Cadence ~30 steps/minute and completed a total of 290 steps.   Returned to his room and patient requesting to end session in recliner. Concluded with safety belt alarm on, call bell in reach, and all needs met. BLE elevated.   2nd session: NT getting routine vitals with patient in bed. Vitals WNL. Pt in agreement to therapy session, no reports of pain.  Supine<>sitting EOB with supervision with HOB elevated. Donned hospital socks rather than shoes, per his request. Ambulatory transfer with minA and no AD from EOB to w/c. Transported in w/c  to main rehab gym for energy conservation.  Stair training completed using 6inch steps and 2 hand rails. Pt navigated up/down 4 + 4 + 4 steps (seated rest breaks) with CGA/minA - step-to vs reciprocal stepping while forward facing.   Returned to his room and assisted to bed. Concluded session in bed with alarm on, call bell in reach.     Therapy Documentation Precautions:  Precautions Precautions: Fall Precaution Comments: JG tube, NPO, hypotension Restrictions Weight Bearing Restrictions: No General:      Therapy/Group: Individual Therapy  ChAlger Simons/17/2024, 7:40 AM

## 2023-01-12 NOTE — Progress Notes (Signed)
Nutrition Follow-up  DOCUMENTATION CODES:   Severe malnutrition in context of chronic illness  INTERVENTION:  - Modify TF to Osmolite 1.5 @ 95 mL/hr over 16 hrs (4PM-8AM) - Add TF20 PS x1 - This will provide the pt with 2360 kcals, 115 gm protein and 1158 mL free water.   - Modify FWF to 150 mL Q4H.   NUTRITION DIAGNOSIS:   Severe Malnutrition related to chronic illness as evidenced by severe fat depletion, severe muscle depletion, percent weight loss. - ongoing.   GOAL:   Provide needs based on ASPEN/SCCM guidelines - Met with TF.   MONITOR:   TF tolerance  REASON FOR ASSESSMENT:   Consult Enteral/tube feeding initiation and management  ASSESSMENT:   77 y.o. male admits to CIR related to functional deficits due to stroke. PMH includes: recurrent DVTs, rheumatoid arthritis, oropharyngeal cancer, T2DM. Pt with hx of esophageal outlet obstructions s/p multiple dilatations and GJ tube placement for FTT.  Meds reviewed: lipitor, sliding scale insulin, mag-ox, prednisone. Las reviewed: Na low, BUN high.   Pt NPO with G-J tube. Pt is receiving nocturnal feeds as ordered. RN reports that the pt is tolerating his TF well. Na is currently low. FWF flushes are currently ordered at 200 mL Q4H. RD will modify FWF to 150 mL Q4H. RD will continue to monitor TF tolerance.   Diet Order:   Diet Order             Diet NPO time specified  Diet effective now                   EDUCATION NEEDS:   Not appropriate for education at this time  Skin:  Skin Assessment: Reviewed RN Assessment  Last BM:  01/08/23  Height:   Ht Readings from Last 1 Encounters:  01/06/23 '6\' 2"'$  (1.88 m)    Weight:   Wt Readings from Last 1 Encounters:  01/11/23 71.3 kg    Ideal Body Weight:     BMI:  Body mass index is 20.18 kg/m.  Estimated Nutritional Needs:   Kcal:  2165-2525 kcals  Protein:  105-125 gm  Fluid:  >/= 2.1 L  Thalia Bloodgood, RD, LDN, CNSC.

## 2023-01-13 ENCOUNTER — Other Ambulatory Visit: Payer: Self-pay

## 2023-01-13 LAB — BASIC METABOLIC PANEL
Anion gap: 10 (ref 5–15)
BUN: 28 mg/dL — ABNORMAL HIGH (ref 8–23)
CO2: 25 mmol/L (ref 22–32)
Calcium: 8.5 mg/dL — ABNORMAL LOW (ref 8.9–10.3)
Chloride: 101 mmol/L (ref 98–111)
Creatinine, Ser: 0.88 mg/dL (ref 0.61–1.24)
GFR, Estimated: >60 mL/min (ref >60)
Glucose, Bld: 186 mg/dL — ABNORMAL HIGH (ref 70–99)
Potassium: 3.8 mmol/L (ref 3.5–5.1)
Sodium: 136 mmol/L (ref 135–145)

## 2023-01-13 LAB — GLUCOSE, CAPILLARY
Glucose-Capillary: 172 mg/dL — ABNORMAL HIGH (ref 70–99)
Glucose-Capillary: 186 mg/dL — ABNORMAL HIGH (ref 70–99)
Glucose-Capillary: 137 mg/dL — ABNORMAL HIGH (ref 70–99)
Glucose-Capillary: 127 mg/dL — ABNORMAL HIGH (ref 70–99)
Glucose-Capillary: 183 mg/dL — ABNORMAL HIGH (ref 70–99)

## 2023-01-13 LAB — MAGNESIUM: Magnesium: 1.7 mg/dL (ref 1.7–2.4)

## 2023-01-13 MED ORDER — MODAFINIL 100 MG PO TABS
200.0000 mg | ORAL_TABLET | Freq: Every day | ORAL | Status: DC
  Administered 2023-01-14 – 2023-01-15 (×2): 200 mg
  Filled 2023-01-13 (×2): qty 2

## 2023-01-13 MED ORDER — MIDODRINE HCL 5 MG PO TABS: 5.0000 mg | ORAL_TABLET | Freq: Two times a day (BID) | ORAL | Status: DC

## 2023-01-13 MED ORDER — MIDODRINE HCL 5 MG PO TABS
10.0000 mg | ORAL_TABLET | Freq: Two times a day (BID) | ORAL | Status: DC
  Administered 2023-01-13 – 2023-01-14 (×2): 10 mg
  Filled 2023-01-13 (×2): qty 2

## 2023-01-13 NOTE — Telephone Encounter (Signed)
Patient will require tube feeding prescriptions faxed to [facility] at [phone]. I spoke with the physician currently overseeing the patients care in acute patient rehab, they have altered the rate of his tube feeds and will provide new prescriptions for the patient and fax these to [facility] upon his discharge from the hospital.

## 2023-01-13 NOTE — Progress Notes (Signed)
PROGRESS NOTE   Subjective/Complaints: Patient with orthostatic hypotension, symptomatic, during PT this AM. Midodrine increased to '5mg'$  BID. Updated son on progress yesterday  Ros: denies CP, SOB, N/V/D, +dizziness, +right CMC joint pain Objective:   CT WRIST RIGHT WO CONTRAST  Result Date: 01/11/2023 CLINICAL DATA:  Radial nerve palsy. EXAM: CT OF THE RIGHT WRIST WITHOUT CONTRAST TECHNIQUE: Multidetector CT imaging of the right wrist was performed according to the standard protocol. Multiplanar CT image reconstructions were also generated. RADIATION DOSE REDUCTION: This exam was performed according to the departmental dose-optimization program which includes automated exposure control, adjustment of the mA and/or kV according to patient size and/or use of iterative reconstruction technique. COMPARISON:  Radiographs dated January 07, 2023 FINDINGS: Bones/Joint/Cartilage No evidence of fracture or osteonecrosis distal radius and ulna are intact carpal bones are intact and in normal anatomical alignment. Mild degenerative changes of the first carpometacarpal joint with joint space narrowing and marginal spurring there are advanced arthritic changes of the first and second metacarpophalangeal joint with osseous erosion of the bases of the phalanges and head of the metacarpals there are degenerative changes of the third and fourth metatarsophalangeal joints. Ligaments Suboptimally assessed by CT. Muscles and Tendons Thenar and hypothenar muscles are normal in bulk. Flexor and extensor tendons are intact. Soft tissues Skin and subcutaneous soft tissues are within normal limits. No fluid collection or hematoma. No significant soft tissue swelling. IMPRESSION: 1. Advanced arthritic changes of the first and second metacarpophalangeal joints with osseous erosion of the bases of the phalanges and head of the metacarpals. 2. Mild degenerative changes of the  first carpometacarpal joint and third and fourth metatarsophalangeal joints. 3. Carpal bones are in normal alignment. Distal radius and ulna are unremarkable. No evidence of fracture or osteonecrosis. Electronically Signed   By: Keane Police D.O.   On: 01/11/2023 22:42   No results for input(s): "WBC", "HGB", "HCT", "PLT" in the last 72 hours.  Recent Labs    01/13/23 0547  NA 136  K 3.8  CL 101  CO2 25  GLUCOSE 186*  BUN 28*  CREATININE 0.88  CALCIUM 8.5*    Intake/Output Summary (Last 24 hours) at 01/13/2023 0950 Last data filed at 01/12/2023 2001 Gross per 24 hour  Intake 150 ml  Output 600 ml  Net -450 ml        Physical Exam: Vital Signs Blood pressure (!) 113/56, pulse 69, temperature 98 F (36.7 C), resp. rate 16, height '6\' 2"'$  (1.88 m), weight 72.2 kg, SpO2 100 %. Constitutional:      Appearance: BMI 20.44. He is ill-appearing.     Comments: Sitting in bed with multiple cups at bedside--one with ice chips, one w/water  Cardiac: RRR no rubs , Murmurs or ES  Resp: CTAB. On RA. No cough or resp distress  Abdominal:     Comments: + PEG tube no tenderness, stoma is CDI      General: Skin is warm and dry. +scarring on neck from prior trach, healed. +2X R forearm Ivs.  MSK: Severe ulnar deviation of fingers in L hand d/t RA; less pronounced on R hand.    Neurological:     Mental  Status: He is alert.     Comments: Oriented to self, place,      RUE 3/5 shoulder abduction, 4-/5 elbow flexion, 3+/5 elbow extension, 2+/5 wrist extension, 1-/5 finger abduction, 2+/5 grip RLE 4-/5 hip flexion, 5-/5 knee extension, 5/5 ankle DF, PF. Ambulating MinA without AD Right CMC joint TTP- velcro wrist splint in place  Assessment/Plan: 1. Functional deficits which require 3+ hours per day of interdisciplinary therapy in a comprehensive inpatient rehab setting. Physiatrist is providing close team supervision and 24 hour management of active medical problems listed below. Physiatrist  and rehab team continue to assess barriers to discharge/monitor patient progress toward functional and medical goals  Care Tool:  Bathing    Body parts bathed by patient: Right arm, Chest, Abdomen, Right upper leg, Left upper leg, Face   Body parts bathed by helper: Left arm, Left lower leg, Right lower leg, Front perineal area, Buttocks     Bathing assist Assist Level: Moderate Assistance - Patient 50 - 74%     Upper Body Dressing/Undressing Upper body dressing   What is the patient wearing?: Pull over shirt    Upper body assist Assist Level: Moderate Assistance - Patient 50 - 74%    Lower Body Dressing/Undressing Lower body dressing      What is the patient wearing?: Incontinence brief, Pants     Lower body assist Assist for lower body dressing: Maximal Assistance - Patient 25 - 49%     Toileting Toileting    Toileting assist Assist for toileting: Maximal Assistance - Patient 25 - 49%     Transfers Chair/bed transfer  Transfers assist  Chair/bed transfer activity did not occur: Safety/medical concerns  Chair/bed transfer assist level: Minimal Assistance - Patient > 75%     Locomotion Ambulation   Ambulation assist   Ambulation activity did not occur: Safety/medical concerns          Walk 10 feet activity   Assist  Walk 10 feet activity did not occur: Safety/medical concerns        Walk 50 feet activity   Assist Walk 50 feet with 2 turns activity did not occur: Safety/medical concerns         Walk 150 feet activity   Assist Walk 150 feet activity did not occur: Safety/medical concerns         Walk 10 feet on uneven surface  activity   Assist Walk 10 feet on uneven surfaces activity did not occur: Safety/medical concerns         Wheelchair     Assist Is the patient using a wheelchair?: Yes Type of Wheelchair: Manual Wheelchair activity did not occur: Safety/medical concerns         Wheelchair 50 feet with 2  turns activity    Assist    Wheelchair 50 feet with 2 turns activity did not occur: Safety/medical concerns       Wheelchair 150 feet activity     Assist  Wheelchair 150 feet activity did not occur: Safety/medical concerns       Blood pressure (!) 113/56, pulse 69, temperature 98 F (36.7 C), resp. rate 16, height '6\' 2"'$  (1.88 m), weight 72.2 kg, SpO2 100 %.    Medical Problem List and Plan: 1. 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  Antithrombotics: -DVT/anticoagulation:  Mechanical: Sequential compression devices, below knee Bilateral lower extremities due to recurrent GIB             -antiplatelet therapy:  Brilinta and ASA 3. 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 Mood/Behavior/Sleep: LCSW to follow for evaluation and support.              -antipsychotic agents: N/A 5. Neuropsych/cognition: This patient is not fully capable of making decisions on his own behalf. 6. Skin/Wound Care: Routine pressure relief. Continue tube feeds             --Question patient awareness/continence.   --Will d/c in am and start toileting program.  7. Fluids/Electrolytes/Nutrition: Continue NPO with tube feeds.              --he reports that he was cleared for ice by [facility]. Will reach out to confirm; NPO in interim given high aspiration risk. --On continuous tube feed-->will change CBGs to every 4 hours. .  8. Left carotid stent: Continue ASA/Brilinta.  9. 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 Resume PPI but not able to crush for tube, pharmacy no longer has suspension   Would do 12wks of PPI, not able to cruch pantoprazole , pt wanted IV removed due to discomfort . Will try nexium cap via tube  May d/c famotidine when PPI available    Latest Ref Rng & Units 01/07/2023     6:08 AM 01/05/2023    3:58 AM 01/04/2023    4:55 AM  CBC  WBC 4.0 - 10.5 K/uL 9.0  6.9  5.7   Hemoglobin 13.0 - 17.0 g/dL 9.0  8.5  8.3   Hematocrit 39.0 - 52.0 % 27.8  25.8  26.0   Platelets 150 - 400 K/uL 232  142  150      11. Thrombocytopenia:Continue to monitor as have ranged187-->94-->142.             --monitor for signs of bleeding.  12. Aspiration PNA/Pneumonitis: On stress dose steriods D# 4/5.              -- 1/11 CXR 2 view w/ongoing frothy cough -> Decreased left-greater-than-right basilar opacities, likely resolving aspiration. Recommend follow-up chest x-ray in 3-4 weeks to ensure complete resolution.             --encourage flutter valve every 4 hrs WA.   13. T2DM: Hgb A1c-6.2. Monitor CBGs every 4 hours and use SSI for elevated BS             --on Prednisone to taper off in 24 hours. Monitor for improvement off this.       Recent Labs    01/06/23 0344 01/06/23 0808 01/06/23 1145  GLUCAP 169* 225* 116*   CBGs >200: d/c feeding supplement   14.Influenza A: Completed Tamiflu X 5 days 15. RA: Was on monthly Orenzia w/ prednisone to 1 mg daily as well as methotrexate and tadalafil. CXR reviewed and shows resolving infiltrate.              --taper to home dose 01/13.  16. Encounter for palliative care: Code status: Full code per palliative care notes 12/15/22. Re-consulted palliative care to discuss goals of care given decline since SNF stay  17.  Dysphagia - chronic PTA, s/p G tube, NPO, continue tube feeds as per  RD recommendations. Discussed with nursing these should only run at night 18. Secretions: scopolamine patch ordered 19. Severe magnesium deficiency: oral supplements per tube added.  20. Hypotension: midodrine increased to '5mg'$  BID  LOS: 7 days A FACE TO FACE EVALUATION WAS PERFORMED  Martha Clan P Khyla Mccumbers 01/13/2023, 9:50 AM

## 2023-01-13 NOTE — Progress Notes (Signed)
Physical Therapy Session Note  Patient Details  Name: [name] MRN: [id number] Date of Birth: 1946/01/03  Today's Date: 01/13/2023 PT Individual Time: 6283-1517 + 6160-7371 PT Individual Time Calculation (min): 56 min  + 58 min  Short Term Goals: Week 1:  PT Short Term Goal 1 (Week 1): Pt will complete bed mobility with supervision without hospital bed features PT Short Term Goal 2 (Week 1): Pt will complete bed<>chair transfers with CGA and LRAD PT Short Term Goal 3 (Week 1): Pt will ambulate 133f with CGA and LRAD PT Short Term Goal 4 (Week 1): Pt will initiate stair training  Skilled Therapeutic Interventions/Progress Updates:      1st session: Pt resting in bed to start, has tube feeds running through PEG. Denies pain but questions when he gets to go home; he confirms he does not want to go back to a SNF at [place]. Discussed importance of having care for tube feeds, iADLs, ADLs, and simple mobility due to his high falls risk and fraility. Pt with decreasing insight into his impairments.   Donned thigh-high compression socks with totalA. Pt needing maxA for donning pants at bed level. He was able to remove his long sleeve shirt without assist but this needed ++ time and removal of his R wrist brace to do so. Pt adament on completing tasks without assist. Supine to sitting EOB without assist. His sitting balance is poor - frequent LOB posteriorly. His endurance is also limited - needing to lie down several times while trying to remove his shirt or don the clean one. Patient reporting he "runs out of gas" while donning clothes.  Abdominal binder applied in sitting with totalA.   Stand<>pivot transfer with minA and no AD to w/c and then transported to main rehab gym for time.  Sitting BP: 112/53 Standing BP: 81/46 *pt symptomatic. MD and care team notified via secure chat.  Short distance gait training, ~574f with CGA and RW. Gait distance limited by fatigue and likely,  hypotension. Patient with difficulty managing RW due to poor R grip strength. Built up RW with coban to improve grip and safety. Minimum improvement during 2nd gait trail of similar distance.   Returned to room and patient assisted to recliner via miRiversidembulatory transfer. BLE elevated, call bell in reach, and all needs met.       2nd session: Pt sitting in recliner and in agreement to therapy session. Reports generalized "RA" pain. Pt asking for pain medication - RN made aware.   Pt transported to main rehab gym.   BP sitting: 117/53 BP standing: 72/48 *pt symptomatic. MD and care team notified via secure chat.   Transported to ortho rehab gym to practice car transfers. Car transfer completed via ambulatory transfer with minA - able to manage LE in/out of vehicle without assist. Cues for safety awareness as he attempts to pull from car door to help stand.  Short distance gait training ~2080fith minA and no AD to Nustep. Gait distance limited by fatigue, hypotensive contributing. Completed Nustep at L4 resistance for 16 minutes, completed 520 steps in total.   Returned to his room and assisted to bed per his request. Able to lie down without assist but cues needed for safety awareness and lying symmetrically in the bed. Call bell in reach, all needs met, alarm on.  Therapy Documentation Precautions:  Precautions Precautions: Fall Precaution Comments: JG tube, NPO, hypotension Restrictions Weight Bearing Restrictions: No General:    Therapy/Group: Individual Therapy  [name] PT 01/13/2023, 7:41 AM

## 2023-01-13 NOTE — Consult Note (Signed)
Palliative Care Consult Note                                  Date: 01/13/2023   Patient Name: [name]  DOB: [birth year]  MRN: [id number]  Age / Sex: 77 y.o., male  PCP: Dennis Pattee, MD Referring Physician: Izora Ribas, MD  Reason for Consultation: Establishing goals of care  HPI/Patient Profile: 77 y.o. 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 Truman Johnson at [facility]), readmission [facility] 11/17-11/28 for AMS, aspiration PNA w/melena and recs for strict NPO.   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  At discharge he was transferred to [facility] for inpatient rehab on 01/06/2023.  PMT was consulted for goals of care conversations given recent decline to the nursing home (after a admission in November 2023) and the fact that inpatient rehab may not be able to get him to supervision level/able to go home with family, possibly requiring SNF which the family is not in favor of.  Past Medical History:  Diagnosis Date   Aortic aneurysm ([place]) 05/2017   Chronic back pain    Diabetes mellitus without complication (HCC)    Dyslipidemia    GERD (gastroesophageal reflux disease)    H/O hiatal hernia    Hypertension    Melanoma ([place])    "back; left arm"   PONV (postoperative nausea and vomiting)    Rheumatoid arthritis ([place])    Spasm of esophagus     Subjective:   This NP [name] reviewed medical records, received report from team, assessed the patient and then meet at the patient's bedside to discuss diagnosis,  prognosis, GOC, EOL wishes disposition and options.  I met with the patient at the bedside.  He is awake, alert, oriented, and has capacity.   Concept of Palliative Care was introduced as specialized medical care for people and their families living with serious illness.  If focuses on providing relief from the symptoms and stress of a serious illness.  The goal is to improve quality of life for both the patient and the family. Values and goals of care important to patient and family were attempted to be elicited.  Created space and opportunity for patient  and family to explore thoughts and feelings regarding current medical situation   Natural trajectory and current clinical status were discussed. Questions and concerns addressed. Patient  encouraged to call with questions or concerns.    Patient/Family Understanding of Illness: He understands he had a stroke 1 to 2 weeks ago and fell on his right hand/wrist which is now injured and has difficulty using them.  He has some speaking problems.  He has chronic dysphagia and has had a feeding tube for over 2 years.  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  He is married and lives with his wife in their home.  Patient Values: Continued living  Goals: Rehab is much as possible for eventual discharge home with as independent of life as possible.  He desires full code and full scope of treatment and pursuit  of this goal.  Today's Discussion: In addition to discussion described above we had extensive discussion on various topics.  He does express some frustration that he is not [name] as fast as he would like.  However, it is noted that he is making substantial improvement there is a question of whether or not he will be supervision level which would allow him to be safely discharged home with follow-up on health therapy.  The physical medicine and rehab team feels more likely to need a SNF admission for some  continued therapy for a short time before going home.  We discussed how his family is not very in favor of this.  When we explored this idea the patient states that he does not want to go to a SNF ("I mean, who really does?")  He is willing to go to SNF/rehab for short stay if that is what is needed to allow him to pursue his goal for his independent functioning as possible.  They have already identified a SNF in [facility] that [facility] their preference.  Social worker is working with them.  He states the only pain he has today really is in his wrist.  He is bit frustrated by his limited mobility in his wrist as well as secondary to known rheumatoid arthritis.  However, he feels that is getting better.  I provided emotional and general support through therapeutic listening, empathy, sharing of stories, and other techniques. I answered all questions and addressed all concerns to the best of my ability.  Review of Systems  Respiratory:  Negative for cough and shortness of breath.   Cardiovascular:  Negative for chest pain.  Gastrointestinal:  Negative for abdominal pain, nausea and vomiting.  Musculoskeletal:        Some wrist discomfort and limitation of movement    Objective:   Primary Diagnoses: Present on Admission:  Acute ischemic left middle cerebral artery (MCA) stroke (HCC)  Dysphagia, pharyngoesophageal phase   Physical Exam Vitals and nursing note reviewed.  Constitutional:      General: He is not in acute distress.    Appearance: He is ill-appearing.  HENT:     Head: Normocephalic and atraumatic.  Cardiovascular:     Rate and Rhythm: Normal rate.  Pulmonary:     Effort: Pulmonary effort is normal. No respiratory distress.     Breath sounds: No wheezing or rhonchi.  Abdominal:     General: Abdomen is flat. Bowel sounds are normal. There is no distension.     Tenderness: There is no abdominal tenderness.     Comments: PEG feeding tube noted, dressing CDI  Skin:     General: Skin is warm and dry.  Neurological:     General: No focal deficit present.     Mental Status: He is alert.  Psychiatric:        Mood and Affect: Mood normal.        Behavior: Behavior normal.     Vital Signs:  BP (!) 113/56 (BP Location: Left Arm)   Pulse 69   Temp 98 F (36.7 C)   Resp 16   Ht '6\' 2"'$  (1.88 m)   Wt 72.2 kg   SpO2 100%   BMI 20.44 kg/m   Palliative Assessment/Data: 70%    Advanced Care Planning:   Existing Vynca/ACP Documentation: None  Primary Decision Maker: PATIENT  Code Status/Advance Care Planning: Full code  A discussion was had today regarding advanced directives. Concepts specific to code status, artifical feeding  and hydration, continued IV antibiotics and rehospitalization was had.  The difference between a aggressive medical intervention path and a palliative comfort care path for this patient at this time was had.   Decisions/Changes to ACP: None today  Assessment & Plan:   Impression: 77 year old male with chronic comorbidities and acute presentation as noted above.  He is currently admitted to CIR/inpatient rehab and is progressing quite well.  They are hopeful to get him to supervision level by time of discharge to allow him to go home with home health therapy follow-up.  However, there is a pretty good chance that they may not quite get to this level and he will require a brief skilled nursing facility/rehab stay prior to going home.  The patient states while he does not want to go to a nursing home, he is okay with going care if that helps him to get well and independent.  Overall long-term prognosis guarded.  SUMMARY OF RECOMMENDATIONS   Remain full code Continue intensive rehab for optimal level of functioning prior to discharge Patient agreeable to SNF/rehab admission if needed Overarching goal to discharge to home as independent as possible PMT will follow-up for any changes after time for the  Symptom Management:   Primary team PMT is available to assist as needed  Prognosis:  Unable to determine  Discharge Planning:  To Be Determined   Discussed with: Patient, medical team, nursing team    Thank you for allowing [place] to participate in the care of AQUARIUS LATOUCHE PMT will continue to support holistically.  Time Total: 80 min  Greater than 50%  of this time was spent counseling and coordinating care related to the above assessment and plan.  Signed by: Dennis Field, NP Palliative Medicine Team  Team Phone # [phone] (Nights/Weekends)  01/13/2023, 9:49 AM

## 2023-01-13 NOTE — Progress Notes (Signed)
Occupational Therapy Note  Patient Details  Name: [name] MRN: [id number] Date of Birth: 02-07-1946  Today's Date: 01/13/2023 OT Missed Time: 39 Minutes Missed Time Reason: Nursing care, Palliative Care  Pt missed 30 mins skilled OT services 2/2 nursing/palliative care. Will attempt to see again as schedule allows   Leroy Libman 01/13/2023, 2:11 PM

## 2023-01-13 NOTE — Plan of Care (Signed)
  Problem: Consults Goal: RH STROKE PATIENT EDUCATION Description: See Patient Education module for education specifics  Outcome: Progressing   Problem: RH BOWEL ELIMINATION Goal: RH STG MANAGE BOWEL WITH ASSISTANCE Description: STG Manage Bowel with mod I  Assistance. Outcome: Progressing Goal: RH STG MANAGE BOWEL W/MEDICATION W/ASSISTANCE Description: STG Manage Bowel with Medication with mod I  Assistance. Outcome: Progressing   Problem: RH BLADDER ELIMINATION Goal: RH STG MANAGE BLADDER WITH ASSISTANCE Description: STG Manage Bladder With toileting Assistance Outcome: Progressing   Problem: RH SAFETY Goal: RH STG ADHERE TO SAFETY PRECAUTIONS W/ASSISTANCE/DEVICE Description: STG Adhere to Safety Precautions With cues Assistance/Device. Outcome: Progressing   Problem: RH KNOWLEDGE DEFICIT Goal: RH STG INCREASE KNOWLEDGE OF DIABETES Description: Patient and family will be able to manage DM with medications and dietary modifications using educational resources independently Outcome: Progressing Goal: RH STG INCREASE KNOWLEDGE OF HYPERTENSION Description: Patient and family will be able to manage HTN with medications and dietary modifications using educational resources independently Outcome: Progressing Goal: RH STG INCREASE KNOWLEDGE OF DYSPHAGIA/FLUID INTAKE Description: Patient and family will be able to manage Dysphagia,  medications and dietary modifications/PEG care and feeding administrations using educational resources independently Outcome: Progressing Goal: RH STG INCREASE KNOWLEGDE OF HYPERLIPIDEMIA Description: Patient and family will be able to manage HLD with medications and dietary modifications using educational resources independently Outcome: Progressing Goal: RH STG INCREASE KNOWLEDGE OF STROKE PROPHYLAXIS Description: Patient and family will be able to manage secondary stroke risks with medications and dietary modifications using educational resources  independently Outcome: Progressing   Problem: Education: Goal: Understanding of CV disease, CV risk reduction, and recovery process will improve Outcome: Progressing Goal: Individualized Educational Video(s) Outcome: Progressing   Problem: Activity: Goal: Ability to return to baseline activity level will improve Outcome: Progressing   Problem: Cardiovascular: Goal: Ability to achieve and maintain adequate cardiovascular perfusion will improve Outcome: Progressing Goal: Vascular access site(s) Level 0-1 will be maintained Outcome: Progressing   Problem: Health Behavior/Discharge Planning: Goal: Ability to safely manage health-related needs after discharge will improve Outcome: Progressing   Problem: Education: Goal: Knowledge of disease or condition will improve Outcome: Progressing Goal: Knowledge of secondary prevention will improve (MUST DOCUMENT ALL) Outcome: Progressing Goal: Knowledge of patient specific risk factors will improve [place] N/A or DELETE if not current risk factor) Outcome: Progressing   Problem: Ischemic Stroke/TIA Tissue Perfusion: Goal: Complications of ischemic stroke/TIA will be minimized Outcome: Progressing   Problem: Coping: Goal: Will verbalize positive feelings about self Outcome: Progressing Goal: Will identify appropriate support needs Outcome: Progressing   Problem: Health Behavior/Discharge Planning: Goal: Ability to manage health-related needs will improve Outcome: Progressing Goal: Goals will be collaboratively established with patient/family Outcome: Progressing   Problem: Self-Care: Goal: Ability to participate in self-care as condition permits will improve Outcome: Progressing Goal: Verbalization of feelings and concerns over difficulty with self-care will improve Outcome: Progressing Goal: Ability to communicate needs accurately will improve Outcome: Progressing   Problem: Nutrition: Goal: Risk of aspiration will  decrease Outcome: Progressing Goal: Dietary intake will improve Outcome: Progressing

## 2023-01-14 DIAGNOSIS — R1314 Dysphagia, pharyngoesophageal phase: Secondary | ICD-10-CM

## 2023-01-14 DIAGNOSIS — Z515 Encounter for palliative care: Secondary | ICD-10-CM

## 2023-01-14 DIAGNOSIS — Z7189 Other specified counseling: Secondary | ICD-10-CM

## 2023-01-14 LAB — GLUCOSE, CAPILLARY
Glucose-Capillary: 124 mg/dL — ABNORMAL HIGH (ref 70–99)
Glucose-Capillary: 130 mg/dL — ABNORMAL HIGH (ref 70–99)
Glucose-Capillary: 151 mg/dL — ABNORMAL HIGH (ref 70–99)
Glucose-Capillary: 163 mg/dL — ABNORMAL HIGH (ref 70–99)
Glucose-Capillary: 178 mg/dL — ABNORMAL HIGH (ref 70–99)
Glucose-Capillary: 195 mg/dL — ABNORMAL HIGH (ref 70–99)

## 2023-01-14 MED ORDER — MIDODRINE HCL 5 MG PO TABS
10.0000 mg | ORAL_TABLET | Freq: Three times a day (TID) | ORAL | Status: DC
  Administered 2023-01-14 – 2023-01-15 (×2): 10 mg
  Filled 2023-01-14 (×2): qty 2

## 2023-01-14 NOTE — Progress Notes (Signed)
Physical Therapy Weekly Progress Note  Patient Details  Name: [name] MRN: [id number] Date of Birth: [birth year]  Beginning of progress report period: January 07, 2023 End of progress report period: January 14, 2023  Today's Date: 01/14/2023 PT Individual Time: 4315-4008 PT Individual Time Calculation (min): 71 min   Patient has met 1 of 4 short term goals.  Pt is making slow progress this reporting period. Primary barriers to progression is symptomatic orthostatic hypotension with SBP dropping into the 80's while standing, resulting in very limiting upright/standing tolerance. Using thigh-high compression socks and abdominal binder for BP support. Patient is currently at an overall minA level with short distance gait and stairs. Pt's goal is to return home however family report he needs to be at a supervision level or will need SNF placement.   Patient continues to demonstrate the following deficits muscle weakness and muscle joint tightness, decreased cardiorespiratoy endurance, decreased initiation, decreased attention, decreased awareness, decreased problem solving, decreased safety awareness, and decreased memory, and decreased sitting balance, decreased standing balance, decreased postural control, hemiplegia, and decreased balance strategies and therefore will continue to benefit from skilled PT intervention to increase functional independence with mobility.  Patient progressing toward long term goals..  Continue plan of care.  PT Short Term Goals Week 1:  PT Short Term Goal 1 (Week 1): Pt will complete bed mobility with supervision without hospital bed features PT Short Term Goal 1 - Progress (Week 1): Partly met PT Short Term Goal 2 (Week 1): Pt will complete bed<>chair transfers with CGA and LRAD PT Short Term Goal 2 - Progress (Week 1): Not met PT Short Term Goal 3 (Week 1): Pt will ambulate 127f with CGA and LRAD PT Short Term Goal 3 - Progress (Week 1): Not met PT  Short Term Goal 4 (Week 1): Pt will initiate stair training PT Short Term Goal 4 - Progress (Week 1): Met Week 2:  PT Short Term Goal 1 (Week 2): = LTG  Skilled Therapeutic Interventions/Progress Updates:      Pt in bed on arrival - agreeable to therapy session. He has his thigh-high compression socks and abdominal binder on as well. Supine<>sitting EOB with supervision. Assisted with donning his pants belt in standing with minA for balance. Pt lacks dexterity and [place] RUE to assist with buttoning or threading belt. He also had x3 LOB posteriorly while donning the belt resulting in quick sitting to the bed. Stand<>pivot transfer with minA from EOB to w/c.   Transported to main rehab gym.  BP sitting: 130/51 BP standing: 101/43 *pt symptomatic. Notified care team via secure chat.   Sit<>stand to RW from w/c with CGA. Gait training ~1047f+ 10093f [id number] CGA and RW on level surfaces - mild difficulty managing/steering RW due to R hand weakness. Cues for widening BOS, slowing down speed, and keeping body within walker frame. Pt needs a 5 minute (timed) seated rest break b/w each gait trial due to fatigue.   Standing there-ex at high/low table: -2x10 standing hip abd, bilaterally -2x10 mini-squats/knee bends  Pt continuing to have thick, clear secretions that he needed to cough up during session - able to clear with productive coughs.   Pt returned to his room and assisted to be via minA ambulatory transfer without an AD. Bed mobility completed with supervision. Call bell in lap, made comfortable, and yaunker within reach.   Therapy Documentation Precautions:  Precautions Precautions: Fall Precaution Comments: JG tube, NPO, hypotension  Restrictions Weight Bearing Restrictions: No General:     Therapy/Group: Individual Therapy  Arda Daggs P Chelcy Bolda PT 01/14/2023, 7:33 AM

## 2023-01-14 NOTE — Progress Notes (Signed)
PROGRESS NOTE   Subjective/Complaints: No new complaints this morning Dizzy with therapy though blood pressure was improved.  Asks about scab on left forearm  Ros: denies CP, SOB, N/V/D, +dizziness, +right CMC joint pain, +scab on left forearm Objective:   No results found. No results for input(s): "WBC", "HGB", "HCT", "PLT" in the last 72 hours.  Recent Labs    01/13/23 0547  NA 136  K 3.8  CL 101  CO2 25  GLUCOSE 186*  BUN 28*  CREATININE 0.88  CALCIUM 8.5*    Intake/Output Summary (Last 24 hours) at 01/14/2023 1442 Last data filed at 01/14/2023 1159 Gross per 24 hour  Intake --  Output 875 ml  Net -875 ml        Physical Exam: Vital Signs Blood pressure 135/60, pulse 76, temperature 98.5 F (36.9 C), resp. rate 17, height '6\' 2"'$  (1.88 m), weight 72.1 kg, SpO2 100 %. Constitutional:      Appearance: BMI 20.41. He is ill-appearing.     Comments: Sitting in bed with multiple cups at bedside--one with ice chips, one w/water  Cardiac: RRR no rubs , Murmurs or ES  Resp: CTAB. On RA. No cough or resp distress  Abdominal:     Comments: + PEG tube no tenderness, stoma is CDI   Skin: Skin is warm and dry. +scarring on neck from prior trach, healed. +2X R forearm Ivs. Scab on left forearm MSK: Severe ulnar deviation of fingers in L hand d/t RA; less pronounced on R hand.    Neurological:     Mental Status: He is alert.     Comments: Oriented to self, place,      RUE 3/5 shoulder abduction, 4-/5 elbow flexion, 3+/5 elbow extension, 2+/5 wrist extension, 1-/5 finger abduction, 2+/5 grip RLE 4-/5 hip flexion, 5-/5 knee extension, 5/5 ankle DF, PF. Ambulating MinA without AD Right CMC joint TTP- velcro wrist splint in place   Assessment/Plan: 1. Functional deficits which require 3+ hours per day of interdisciplinary therapy in a comprehensive inpatient rehab setting. Physiatrist is providing close team  supervision and 24 hour management of active medical problems listed below. Physiatrist and rehab team continue to assess barriers to discharge/monitor patient progress toward functional and medical goals  Care Tool:  Bathing    Body parts bathed by patient: Right arm, Chest, Abdomen, Front perineal area, Right upper leg, Left upper leg, Face   Body parts bathed by helper: Buttocks, Left lower leg, Right lower leg, Left arm     Bathing assist Assist Level: Moderate Assistance - Patient 50 - 74%     Upper Body Dressing/Undressing Upper body dressing   What is the patient wearing?: Pull over shirt    Upper body assist Assist Level: Minimal Assistance - Patient > 75%    Lower Body Dressing/Undressing Lower body dressing      What is the patient wearing?: Pants, Incontinence brief     Lower body assist Assist for lower body dressing: Moderate Assistance - Patient 50 - 74%     Toileting Toileting    Toileting assist Assist for toileting: Minimal Assistance - Patient > 75%     Transfers Chair/bed transfer  Transfers assist  Chair/bed transfer activity did not occur: Safety/medical concerns  Chair/bed transfer assist level: Minimal Assistance - Patient > 75%     Locomotion Ambulation   Ambulation assist   Ambulation activity did not occur: Safety/medical concerns          Walk 10 feet activity   Assist  Walk 10 feet activity did not occur: Safety/medical concerns        Walk 50 feet activity   Assist Walk 50 feet with 2 turns activity did not occur: Safety/medical concerns         Walk 150 feet activity   Assist Walk 150 feet activity did not occur: Safety/medical concerns         Walk 10 feet on uneven surface  activity   Assist Walk 10 feet on uneven surfaces activity did not occur: Safety/medical concerns         Wheelchair     Assist Is the patient using a wheelchair?: Yes Type of Wheelchair: Manual Wheelchair activity  did not occur: Safety/medical concerns         Wheelchair 50 feet with 2 turns activity    Assist    Wheelchair 50 feet with 2 turns activity did not occur: Safety/medical concerns       Wheelchair 150 feet activity     Assist  Wheelchair 150 feet activity did not occur: Safety/medical concerns       Blood pressure 135/60, pulse 76, temperature 98.5 F (36.9 C), resp. rate 17, height '6\' 2"'$  (1.88 m), weight 72.1 kg, SpO2 100 %.    Medical Problem List and Plan: 1. 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  Antithrombotics: -DVT/anticoagulation:  Mechanical: Sequential compression devices, below knee Bilateral lower extremities due to recurrent GIB             -antiplatelet therapy:  Brilinta and ASA 3. 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 Mood/Behavior/Sleep: LCSW to follow for evaluation and support.              -antipsychotic agents: N/A 5. Neuropsych/cognition: This patient is not fully capable of making decisions on his own behalf. 6. Skin/Wound Care: Routine pressure relief. Continue tube feeds             --Question patient awareness/continence.   --Will d/c in am and start toileting program.  7. Fluids/Electrolytes/Nutrition: Continue NPO with tube feeds.              --he reports that he was cleared for ice by [facility]. Will reach out to confirm; NPO in interim given high aspiration risk. --On continuous tube feed-->will change CBGs to every 4 hours. .  8. Left carotid stent: Continue ASA/Brilinta.  9. 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 Resume PPI but not able to crush for tube, pharmacy no longer has suspension   Would do 12wks of PPI, not able to cruch pantoprazole , pt wanted IV  removed due to discomfort . Will try nexium cap via tube  May d/c famotidine when PPI available    Latest Ref Rng & Units  01/07/2023    6:08 AM 01/05/2023    3:58 AM 01/04/2023    4:55 AM  CBC  WBC 4.0 - 10.5 K/uL 9.0  6.9  5.7   Hemoglobin 13.0 - 17.0 g/dL 9.0  8.5  8.3   Hematocrit 39.0 - 52.0 % 27.8  25.8  26.0   Platelets 150 - 400 K/uL 232  142  150      11. Thrombocytopenia:Continue to monitor as have ranged187-->94-->142.             --monitor for signs of bleeding.  12. Aspiration PNA/Pneumonitis: On stress dose steriods D# 4/5.              -- 1/11 CXR 2 view w/ongoing frothy cough -> Decreased left-greater-than-right basilar opacities, likely resolving aspiration. Recommend follow-up chest x-ray in 3-4 weeks to ensure complete resolution.             --encourage flutter valve every 4 hrs WA.   13. T2DM: Hgb A1c-6.2. Monitor CBGs every 4 hours and use SSI for elevated BS             --on Prednisone to taper off in 24 hours. Monitor for improvement off this.       Recent Labs    01/06/23 0344 01/06/23 0808 01/06/23 1145  GLUCAP 169* 225* 116*   CBGs >200: d/c feeding supplement   14.Influenza A: Completed Tamiflu X 5 days 15. RA: Was on monthly Orenzia w/ prednisone to 1 mg daily as well as methotrexate and tadalafil. CXR reviewed and shows resolving infiltrate.              --taper to home dose 01/13.  16. Encounter for palliative care: Code status: Full code per palliative care notes 12/15/22. Re-consulted palliative care to discuss goals of care given decline since SNF stay  17.  Dysphagia - chronic PTA, s/p G tube, NPO, continue tube feeds as per RD recommendations. Discussed with nursing these should only run at night 18. Secretions: scopolamine patch ordered 19. Severe magnesium deficiency: oral supplements per tube added, continue  20. Hypotension: midodrine increased to '10mg'$  TID 21. Right hand weakness: improving, discussed with son   LOS: 8 days A FACE TO  FACE EVALUATION WAS [facility] 01/14/2023, 2:42 PM

## 2023-01-14 NOTE — Progress Notes (Addendum)
Speech Language Pathology Weekly Progress and Session Note  Patient Details  Name: [name] MRN: [id number] Date of Birth: Jan 19, 1946  Beginning of progress report period: January 07, 2023 End of progress report period: January 14, 2023  Today's Date: 01/14/2023 SLP Individual Time: 1055-1130 SLP Individual Time Calculation (min): 35 min  Short Term Goals: Week 1: SLP Short Term Goal 1 (Week 1): Patient will consistently complete oral care via the suction toothbrush with Min verbal cues for set-up/frequency. SLP Short Term Goal 1 - Progress (Week 1): Met SLP Short Term Goal 2 (Week 1): Patient will demonstrate sustained attention to functional tasks for 30 minutes with Min verbal cues for redirection. SLP Short Term Goal 2 - Progress (Week 1): Met SLP Short Term Goal 3 (Week 1): Patient will demonstrate functional problem solving for basic and familiar tasks with Min verbal cues. SLP Short Term Goal 3 - Progress (Week 1): Met SLP Short Term Goal 4 (Week 1): Patient will demonstrate recall of functional information with Mod verbal and visual cues. SLP Short Term Goal 4 - Progress (Week 1): Met    New Short Term Goals: Week 2: SLP Short Term Goal 1 (Week 2): STG's = LTG's due to ELOS  Weekly Progress Updates: [name] has demonstrated steady progress, this past reporting period, as evident by meeting all short-term goals outlined above. Currently, [name] benefits from overall min assistance for completion of basic IADL tasks r/t money and medication management. Reports family assisted with IADL's prior to admission. Recommend continuation of ST intervention during this admission. Recommend suctioning kit at time of d/c and possibly a short-course of ST intervention though suspect [name] is approaching baseline. Remains NPO with PEG, per guidelines at [facility].   Intensity: Minumum of 1-2 x/day, 30 to 90 minutes Frequency: 3 to 5 out of 7 days Duration/Length of Stay: 1/24 -  ELOS Treatment/Interventions: Cognitive remediation/compensation;Internal/external aids;Therapeutic Activities;Environmental controls;Patient/family education;Functional tasks   Daily Session  Skilled Therapeutic Interventions:     [name] seen this date for skilled ST intervention targeting cognitive goals outlined in care plan. [name] and lying semi-reclined in bed, watching TV. Agreeable to intervention at bedside. Participatory and engaged throughout. Intermittent cough noted with [name] utilizing [place] for suctioning oral secretions with overall Sup A; remains NPO with PEG.   Today's session with emphasis on functional cognitive skill training within the context of basic IADL tasks (money and medication management). Completed basic money management task r/t counting coins and bills with ~60% accuracy given Sup A; improved to 100% accuracy given Min to Mod A verbal cues. As [name] was completing task, [name] did not always appear to be aware when [name] was incorrect. When SLP assisted in self-monitoring and correcting, [name] states "this is why I don't keep money in my wallet." Re: medication management, [name] unable to recall any of his current medications on probe. Following a delay with distraction, [name] recalled 3 out of 4 current medications at the Mod I level; 4 out of 4 medications with Min A verbal cues. States his wife will help him with medication and financial management post-discharge and reinforced. Benefited from cues to utilize compensatory memory strategies. Throughout today's session, [name] demonstrated sustained attention for up to 30 minutes with no cues needed for redirection. Recalled [name] took a shower with OT. Benefited from [place] A verbal and visual cues to read and interpret the remainder of his therapy schedule for the day. [name] verbalized appreciation for today's therapy session.   [name] left  in Johnson and lying in bed with all safety measures activated, call bell within reach, and all immediate  needs met. Continue per current ST POC.  Pain No pain reported; NAD  Therapy/Group: Individual Therapy  Verba Ainley A [facility] 01/14/2023, 1:01 PM

## 2023-01-14 NOTE — Progress Notes (Signed)
Occupational Therapy Weekly Progress Note  Patient Details  Name: [name] MRN: [id number] Date of Birth: 1946/11/15  Beginning of progress report period: January 07, 2023 End of progress report period: January 14, 2023  Today's Date: 01/14/2023 OT Individual Time: 0354-6568 OT Individual Time Calculation (min): 65 min    Patient has met 2 of 4 short term goals.  Patient showing inconsistent daily performance based on orthostatic hypotension.  Patient with significant medical issues.    Patient continues to demonstrate the following deficits: muscle weakness, decreased cardiorespiratoy endurance, abnormal tone and decreased coordination, decreased initiation, decreased attention, decreased awareness, decreased problem solving, decreased memory, and delayed processing, and decreased sitting balance, decreased standing balance, decreased postural control, and decreased balance strategies and therefore will continue to benefit from skilled OT intervention to enhance overall performance with BADL.  Patient progressing toward long term goals..  Continue plan of care.  OT Short Term Goals Week 1:  OT Short Term Goal 1 (Week 1): Patient will complete shower transfer with min assist OT Short Term Goal 1 - Progress (Week 1): Met OT Short Term Goal 2 (Week 1): Patient will shower with min assist OT Short Term Goal 2 - Progress (Week 1): Not met OT Short Term Goal 3 (Week 1): Patient will dress upper body with min assist OT Short Term Goal 3 - Progress (Week 1): Met OT Short Term Goal 4 (Week 1): Patient will demonstrate 30* of active wrist extension without increase in pain symptoms OT Short Term Goal 4 - Progress (Week 1): Met Week 2:  OT Short Term Goal 1 (Week 2): Patient will don overhead shirt with set up assistance OT Short Term Goal 2 (Week 2): Patient will don pants with min assist OT Short Term Goal 3 (Week 2): Patient will don right wrist brace with mod assist OT Short Term  Goal 4 (Week 2): Patient will demonstrate 30* of active wrist extension and sustain x 3 seconds against gravity OT Short Term Goal 5 (Week 2): Patient will trasnfer to shower seat with contact guard assist  Skilled Therapeutic Interventions/Progress Updates:    Patient received supine in bed.  Agreeable to OT session and wanting to shower.  Patient sat at edge of bed without assistance (HOB slightly raised) Patient states "I am getting kicked out of here pretty soon, and if I don't start doing better I will have to go to a nursing home."  Reassured patient that team was working to help him overcome weakness and medical issues.  Reviewed discharge date.   Patient indicated need to void and had continent BM.  Walked from toilet to shower with min assist.  Sat to shower - assist provided to help him conserve energy.   Transported patient to sink in wheelchair to complete hygiene and dressing.  Patient able to shave and comb his hair this am.   Patient upright for session x 60 min!  Assisted back to bed due to fatigue - end of session.  Bed alarm engaged and call bell, suction and personal items within reach.   Therapy Documentation Precautions:  Precautions Precautions: Fall Precaution Comments: JG tube, NPO, hypotension Restrictions Weight Bearing Restrictions: No Pain:  Denies pain   Therapy/Group: Individual Therapy  Mariah Milling 01/14/2023, 12:36 PM

## 2023-01-14 NOTE — Progress Notes (Signed)
Daily Progress Note   Patient Name: [name]       Date: 01/14/2023 DOB: [birth year]  Age: 77 y.o. MRN#: [id number] Attending Physician: Izora Ribas, MD Primary Care Physician: Elaina Pattee, MD Admit Date: 01/06/2023 Length of Stay: 8 days  Reason for Consultation/Follow-up: Establishing goals of care  HPI/Patient Profile:  77 y.o. 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 [name] at [facility]), readmission [facility] 11/17-11/28 for AMS, aspiration PNA w/melena and recs for strict NPO.    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  At discharge he was transferred to [facility] for inpatient rehab on 01/06/2023.   PMT was consulted for goals of care conversations given recent decline to the nursing home (after a admission in November 2023) and the fact that inpatient rehab may not be able to get him to supervision level/able to go home with family, possibly requiring SNF which the family is not in favor of.  Subjective:   Subjective: Chart Reviewed. Updates received. Patient Assessed. Created space and opportunity for patient  and family to explore thoughts and feelings regarding current medical situation.  Today's Discussion: Today met with the patient at the bedside, no family was present.  He was laying the bed, dressed.  He is already had some therapy today and planning for more therapy later today.  We reviewed our discussion yesterday.  I confirmed that he is still agreeable to full code, full scope of care.  Still agreeable to SNF/rehab discharge if needed.   Otherwise, ultimate goal is home with home health therapy to be as independent as possible.  During my discussion with him Dr. Ranell Patrick entered the room and expressed how well he is doing with his rehab.  They will continue their best efforts to get him to be able to discharge home if possible.  He expressed appreciation.  He feels that his wrist is doing a little bit better today.  Still has some limitation in movement.  I provided emotional and general support through therapeutic listening, empathy, sharing of stories, and other techniques. I answered all questions and addressed all concerns to the best of my ability.  Review of Systems  Respiratory:  Negative for cough and shortness of breath.   Cardiovascular:  Negative for chest pain.  Gastrointestinal:  Negative for abdominal pain, nausea and vomiting.  Musculoskeletal:        Some limitation in movement    Objective:   Vital Signs:  BP 135/60 (BP Location: Right Arm)   Pulse 76   Temp 98.5 F (36.9 C)   Resp 17   Ht '6\' 2"'$  (1.88 m)   Wt 72.1 kg   SpO2 100%   BMI 20.41 kg/m   Physical Exam: Physical Exam Vitals and nursing note reviewed.  Constitutional:      General: He is not in acute distress.    Appearance: He is ill-appearing.  HENT:     Head: Normocephalic and atraumatic.  Cardiovascular:     Rate and Rhythm: Normal rate.  Pulmonary:     Effort: Pulmonary effort is normal. No respiratory distress.  Abdominal:     General: Abdomen is flat.  Palpations: Abdomen is soft.  Skin:    General: Skin is warm and dry.  Neurological:     General: No focal deficit present.     Mental Status: He is alert.  Psychiatric:        Mood and Affect: Mood normal.        Behavior: Behavior normal.     Palliative Assessment/Data: 70%    Existing Vynca/ACP Documentation: None  Assessment & Plan:   Impression: Present on Admission:  Acute ischemic left middle cerebral artery (MCA) stroke (HCC)  Dysphagia,  pharyngoesophageal phase  77 year old male with chronic comorbidities and acute presentation as noted above.  He is currently admitted to CIR/inpatient rehab and is progressing quite well.  They are hopeful to get him to supervision level by time of discharge to allow him to go home with home health therapy follow-up.  However, there is a pretty good chance that they may not quite get to this level and he will require a brief skilled nursing facility/rehab stay prior to going home.  The patient states while he does not want to go to a nursing home, he is okay with going care if that helps him to get well and independent.  Overall long-term prognosis guarded.  SUMMARY OF RECOMMENDATIONS   Remain full code, full scope of care Continued intensive rehab for optimal level of function Agreeable to SNF/rehab if needed Overall goal is to get home as independent as possible Goals are clear at this time PMT will sign off at this time Please call us for any significant clinical changes or new palliative needs  Symptom Management:  Primary team PMT is available to assist as needed  Code Status: Full code  Prognosis: Unable to determine  Discharge Planning: To Be Determined  Discussed with: Patient, medical team  Thank you for allowing [place] to participate in the care of SHANNAN GARFINKEL PMT will continue to support holistically.  Time Total: 40 min  Visit consisted of counseling and education dealing with the complex and emotionally intense issues of symptom management and palliative care in the setting of serious and potentially life-threatening illness. Greater than 50%  of this time was spent counseling and coordinating care related to the above assessment and plan.  Walden Field, NP Palliative Medicine Team  Team Phone # [phone] (Nights/Weekends)  08/25/2021, 8:17 AM

## 2023-01-15 ENCOUNTER — Encounter (HOSPITAL_COMMUNITY): Payer: Self-pay

## 2023-01-15 ENCOUNTER — Inpatient Hospital Stay (HOSPITAL_COMMUNITY): Payer: Medicare Other | Admitting: Anesthesiology

## 2023-01-15 ENCOUNTER — Inpatient Hospital Stay (HOSPITAL_COMMUNITY): Payer: Medicare Other

## 2023-01-15 ENCOUNTER — Encounter (HOSPITAL_COMMUNITY)
Admission: RE | Disposition: A | Discharge status: Other Acute Inpatient Hospital | Payer: Self-pay | Source: Intra-hospital | Attending: Physical Medicine and Rehabilitation

## 2023-01-15 ENCOUNTER — Inpatient Hospital Stay (HOSPITAL_COMMUNITY)
Admission: AD | Admit: 2023-01-15 | Discharge: 2023-01-28 | DRG: 380 | Disposition: A | Discharge status: Skilled Nursing Facility | Payer: Medicare Other | Source: Skilled Nursing Facility | Attending: Student in an Organized Health Care Education/Training Program | Admitting: Student in an Organized Health Care Education/Training Program

## 2023-01-15 ENCOUNTER — Encounter (HOSPITAL_COMMUNITY): Payer: Self-pay | Admitting: Physical Medicine and Rehabilitation

## 2023-01-15 DIAGNOSIS — Z79631 Long term (current) use of antimetabolite agent: Secondary | ICD-10-CM

## 2023-01-15 DIAGNOSIS — Z8249 Family history of ischemic heart disease and other diseases of the circulatory system: Secondary | ICD-10-CM

## 2023-01-15 DIAGNOSIS — E785 Hyperlipidemia, unspecified: Secondary | ICD-10-CM | POA: Diagnosis present

## 2023-01-15 DIAGNOSIS — Z8589 Personal history of malignant neoplasm of other organs and systems: Secondary | ICD-10-CM

## 2023-01-15 DIAGNOSIS — Z9911 Dependence on respirator [ventilator] status: Secondary | ICD-10-CM | POA: Diagnosis not present

## 2023-01-15 DIAGNOSIS — K922 Gastrointestinal hemorrhage, unspecified: Secondary | ICD-10-CM | POA: Diagnosis present

## 2023-01-15 DIAGNOSIS — M069 Rheumatoid arthritis, unspecified: Secondary | ICD-10-CM | POA: Diagnosis present

## 2023-01-15 DIAGNOSIS — Z85820 Personal history of malignant melanoma of skin: Secondary | ICD-10-CM

## 2023-01-15 DIAGNOSIS — Z79899 Other long term (current) drug therapy: Secondary | ICD-10-CM | POA: Diagnosis not present

## 2023-01-15 DIAGNOSIS — Z8679 Personal history of other diseases of the circulatory system: Secondary | ICD-10-CM

## 2023-01-15 DIAGNOSIS — R578 Other shock: Secondary | ICD-10-CM | POA: Diagnosis present

## 2023-01-15 DIAGNOSIS — Z86711 Personal history of pulmonary embolism: Secondary | ICD-10-CM

## 2023-01-15 DIAGNOSIS — E1165 Type 2 diabetes mellitus with hyperglycemia: Secondary | ICD-10-CM | POA: Diagnosis present

## 2023-01-15 DIAGNOSIS — Z7902 Long term (current) use of antithrombotics/antiplatelets: Secondary | ICD-10-CM | POA: Diagnosis not present

## 2023-01-15 DIAGNOSIS — Z9862 Peripheral vascular angioplasty status: Secondary | ICD-10-CM | POA: Diagnosis not present

## 2023-01-15 DIAGNOSIS — Z86718 Personal history of other venous thrombosis and embolism: Secondary | ICD-10-CM | POA: Diagnosis not present

## 2023-01-15 DIAGNOSIS — C029 Malignant neoplasm of tongue, unspecified: Secondary | ICD-10-CM | POA: Diagnosis present

## 2023-01-15 DIAGNOSIS — J9601 Acute respiratory failure with hypoxia: Secondary | ICD-10-CM | POA: Diagnosis present

## 2023-01-15 DIAGNOSIS — E43 Unspecified severe protein-calorie malnutrition: Secondary | ICD-10-CM | POA: Diagnosis present

## 2023-01-15 DIAGNOSIS — C109 Malignant neoplasm of oropharynx, unspecified: Secondary | ICD-10-CM | POA: Diagnosis present

## 2023-01-15 DIAGNOSIS — Z7189 Other specified counseling: Secondary | ICD-10-CM | POA: Diagnosis not present

## 2023-01-15 DIAGNOSIS — E271 Primary adrenocortical insufficiency: Secondary | ICD-10-CM | POA: Insufficient documentation

## 2023-01-15 DIAGNOSIS — C770 Secondary and unspecified malignant neoplasm of lymph nodes of head, face and neck: Secondary | ICD-10-CM | POA: Diagnosis present

## 2023-01-15 DIAGNOSIS — Z79890 Hormone replacement therapy (postmenopausal): Secondary | ICD-10-CM

## 2023-01-15 DIAGNOSIS — Z931 Gastrostomy status: Secondary | ICD-10-CM

## 2023-01-15 DIAGNOSIS — K222 Esophageal obstruction: Secondary | ICD-10-CM | POA: Diagnosis present

## 2023-01-15 DIAGNOSIS — Z809 Family history of malignant neoplasm, unspecified: Secondary | ICD-10-CM

## 2023-01-15 DIAGNOSIS — K920 Hematemesis: Secondary | ICD-10-CM

## 2023-01-15 DIAGNOSIS — Z8673 Personal history of transient ischemic attack (TIA), and cerebral infarction without residual deficits: Secondary | ICD-10-CM

## 2023-01-15 DIAGNOSIS — Z87891 Personal history of nicotine dependence: Secondary | ICD-10-CM | POA: Diagnosis not present

## 2023-01-15 DIAGNOSIS — D62 Acute posthemorrhagic anemia: Principal | ICD-10-CM

## 2023-01-15 DIAGNOSIS — R5381 Other malaise: Secondary | ICD-10-CM

## 2023-01-15 DIAGNOSIS — I10 Essential (primary) hypertension: Secondary | ICD-10-CM | POA: Diagnosis present

## 2023-01-15 DIAGNOSIS — K2101 Gastro-esophageal reflux disease with esophagitis, with bleeding: Secondary | ICD-10-CM | POA: Diagnosis present

## 2023-01-15 DIAGNOSIS — Z7984 Long term (current) use of oral hypoglycemic drugs: Secondary | ICD-10-CM

## 2023-01-15 DIAGNOSIS — Z7952 Long term (current) use of systemic steroids: Secondary | ICD-10-CM | POA: Diagnosis not present

## 2023-01-15 DIAGNOSIS — K449 Diaphragmatic hernia without obstruction or gangrene: Secondary | ICD-10-CM | POA: Diagnosis not present

## 2023-01-15 DIAGNOSIS — K2081 Other esophagitis with bleeding: Secondary | ICD-10-CM | POA: Diagnosis not present

## 2023-01-15 DIAGNOSIS — I119 Hypertensive heart disease without heart failure: Secondary | ICD-10-CM | POA: Diagnosis not present

## 2023-01-15 DIAGNOSIS — Z7901 Long term (current) use of anticoagulants: Secondary | ICD-10-CM

## 2023-01-15 DIAGNOSIS — K2211 Ulcer of esophagus with bleeding: Secondary | ICD-10-CM | POA: Diagnosis present

## 2023-01-15 DIAGNOSIS — Z539 Procedure and treatment not carried out, unspecified reason: Secondary | ICD-10-CM | POA: Diagnosis present

## 2023-01-15 DIAGNOSIS — Z955 Presence of coronary angioplasty implant and graft: Secondary | ICD-10-CM | POA: Diagnosis not present

## 2023-01-15 DIAGNOSIS — Z6821 Body mass index (BMI) 21.0-21.9, adult: Secondary | ICD-10-CM

## 2023-01-15 DIAGNOSIS — E46 Unspecified protein-calorie malnutrition: Secondary | ICD-10-CM | POA: Diagnosis not present

## 2023-01-15 DIAGNOSIS — K264 Chronic or unspecified duodenal ulcer with hemorrhage: Secondary | ICD-10-CM

## 2023-01-15 DIAGNOSIS — M1811 Unilateral primary osteoarthritis of first carpometacarpal joint, right hand: Secondary | ICD-10-CM | POA: Diagnosis present

## 2023-01-15 DIAGNOSIS — Z7982 Long term (current) use of aspirin: Secondary | ICD-10-CM

## 2023-01-15 DIAGNOSIS — Z923 Personal history of irradiation: Secondary | ICD-10-CM | POA: Diagnosis not present

## 2023-01-15 DIAGNOSIS — I6320 Cerebral infarction due to unspecified occlusion or stenosis of unspecified precerebral arteries: Secondary | ICD-10-CM | POA: Diagnosis not present

## 2023-01-15 DIAGNOSIS — K921 Melena: Secondary | ICD-10-CM | POA: Diagnosis not present

## 2023-01-15 DIAGNOSIS — Z95828 Presence of other vascular implants and grafts: Principal | ICD-10-CM

## 2023-01-15 HISTORY — PX: ESOPHAGOGASTRODUODENOSCOPY (EGD) WITH PROPOFOL: SHX5813

## 2023-01-15 LAB — BLOOD GAS, ARTERIAL
Acid-base deficit: 0.2 mmol/L (ref 0.0–2.0)
Bicarbonate: 25.9 mmol/L (ref 20.0–28.0)
Drawn by: 418751
O2 Saturation: 100 %
Patient temperature: 37
pCO2 arterial: 47 mmHg (ref 32–48)
pH, Arterial: 7.35 (ref 7.35–7.45)
pO2, Arterial: 165 mmHg — ABNORMAL HIGH (ref 83–108)

## 2023-01-15 LAB — CBC
HCT: 18.4 % — ABNORMAL LOW (ref 39.0–52.0)
HCT: 23.9 % — ABNORMAL LOW (ref 39.0–52.0)
Hemoglobin: 6.2 g/dL — CL (ref 13.0–17.0)
Hemoglobin: 8 g/dL — ABNORMAL LOW (ref 13.0–17.0)
MCH: 28.8 pg (ref 26.0–34.0)
MCH: 29 pg (ref 26.0–34.0)
MCHC: 33.7 g/dL (ref 30.0–36.0)
MCHC: 33.5 g/dL (ref 30.0–36.0)
MCV: 85.6 fL (ref 80.0–100.0)
MCV: 86.6 fL (ref 80.0–100.0)
Platelets: 291 10*3/uL (ref 150–400)
Platelets: 309 10*3/uL (ref 150–400)
RBC: 2.15 MIL/uL — ABNORMAL LOW (ref 4.22–5.81)
RBC: 2.76 MIL/uL — ABNORMAL LOW (ref 4.22–5.81)
RDW: 17.2 % — ABNORMAL HIGH (ref 11.5–15.5)
RDW: 16.3 % — ABNORMAL HIGH (ref 11.5–15.5)
WBC: 10.6 10*3/uL — ABNORMAL HIGH (ref 4.0–10.5)
WBC: 11.5 10*3/uL — ABNORMAL HIGH (ref 4.0–10.5)
nRBC: 0 % (ref 0.0–0.2)
nRBC: 0 % (ref 0.0–0.2)

## 2023-01-15 LAB — GLUCOSE, CAPILLARY
Glucose-Capillary: 139 mg/dL — ABNORMAL HIGH (ref 70–99)
Glucose-Capillary: 153 mg/dL — ABNORMAL HIGH (ref 70–99)
Glucose-Capillary: 172 mg/dL — ABNORMAL HIGH (ref 70–99)
Glucose-Capillary: 173 mg/dL — ABNORMAL HIGH (ref 70–99)
Glucose-Capillary: 191 mg/dL — ABNORMAL HIGH (ref 70–99)
Glucose-Capillary: 192 mg/dL — ABNORMAL HIGH (ref 70–99)
Glucose-Capillary: 201 mg/dL — ABNORMAL HIGH (ref 70–99)

## 2023-01-15 LAB — BASIC METABOLIC PANEL
Anion gap: 11 (ref 5–15)
BUN: 55 mg/dL — ABNORMAL HIGH (ref 8–23)
CO2: 23 mmol/L (ref 22–32)
Calcium: 8.2 mg/dL — ABNORMAL LOW (ref 8.9–10.3)
Chloride: 102 mmol/L (ref 98–111)
Creatinine, Ser: 0.99 mg/dL (ref 0.61–1.24)
GFR, Estimated: >60 mL/min (ref >60)
Glucose, Bld: 154 mg/dL — ABNORMAL HIGH (ref 70–99)
Potassium: 4.6 mmol/L (ref 3.5–5.1)
Sodium: 136 mmol/L (ref 135–145)

## 2023-01-15 LAB — PROTIME-INR
INR: 1.2 (ref 0.8–1.2)
Prothrombin Time: 15 seconds (ref 11.4–15.2)

## 2023-01-15 LAB — MRSA NEXT GEN BY PCR, NASAL: MRSA by PCR Next Gen: NOT DETECTED

## 2023-01-15 LAB — PREPARE RBC (CROSSMATCH)

## 2023-01-15 SURGERY — ESOPHAGOGASTRODUODENOSCOPY (EGD) WITH PROPOFOL
Anesthesia: Monitor Anesthesia Care

## 2023-01-15 MED ORDER — KETAMINE HCL 50 MG/5ML IJ SOSY
PREFILLED_SYRINGE | INTRAMUSCULAR | Status: AC
  Filled 2023-01-15: qty 10

## 2023-01-15 MED ORDER — PANTOPRAZOLE SODIUM 40 MG IV SOLR
40.0000 mg | Freq: Two times a day (BID) | INTRAVENOUS | Status: DC
  Administered 2023-01-15: 40 mg via INTRAVENOUS
  Filled 2023-01-15: qty 10

## 2023-01-15 MED ORDER — MIDAZOLAM HCL 2 MG/2ML IJ SOLN: 1.0000 mg | Freq: Once | INTRAMUSCULAR | Status: AC

## 2023-01-15 MED ORDER — DOCUSATE SODIUM 50 MG/5ML PO LIQD
100.0000 mg | Freq: Two times a day (BID) | ORAL | Status: DC
  Administered 2023-01-16 – 2023-01-19 (×5): 100 mg
  Filled 2023-01-15 (×4): qty 10

## 2023-01-15 MED ORDER — PANTOPRAZOLE SODIUM 40 MG IV SOLR
40.0000 mg | Freq: Two times a day (BID) | INTRAVENOUS | Status: DC
  Administered 2023-01-19 – 2023-01-28 (×19): 40 mg via INTRAVENOUS
  Filled 2023-01-15 (×19): qty 10

## 2023-01-15 MED ORDER — ORAL CARE MOUTH RINSE: 15.0000 mL | OROMUCOSAL | Status: DC | PRN

## 2023-01-15 MED ORDER — NOREPINEPHRINE 4 MG/250ML-% IV SOLN: 0.0000 ug/min | INTRAVENOUS | Status: DC

## 2023-01-15 MED ORDER — NOREPINEPHRINE 4 MG/250ML-% IV SOLN
2.0000 ug/min | INTRAVENOUS | Status: DC
  Administered 2023-01-15 (×2): 10 ug/min via INTRAVENOUS
  Administered 2023-01-16: 4 ug/min via INTRAVENOUS
  Filled 2023-01-15 (×3): qty 250

## 2023-01-15 MED ORDER — ROCURONIUM BROMIDE 10 MG/ML (PF) SYRINGE
PREFILLED_SYRINGE | INTRAVENOUS | Status: AC
  Administered 2023-01-15: 80 mg via INTRAVENOUS
  Filled 2023-01-15: qty 10

## 2023-01-15 MED ORDER — ETOMIDATE 2 MG/ML IV SOLN
INTRAVENOUS | Status: AC
  Filled 2023-01-15: qty 20

## 2023-01-15 MED ORDER — MIDAZOLAM HCL 2 MG/2ML IJ SOLN
INTRAMUSCULAR | Status: AC
  Administered 2023-01-15: 1 mg via INTRAVENOUS
  Filled 2023-01-15: qty 2

## 2023-01-15 MED ORDER — CHLORHEXIDINE GLUCONATE CLOTH 2 % EX PADS
6.0000 | MEDICATED_PAD | Freq: Every day | CUTANEOUS | Status: DC
  Administered 2023-01-15 – 2023-01-24 (×7): 6 via TOPICAL

## 2023-01-15 MED ORDER — DOCUSATE SODIUM 50 MG/5ML PO LIQD: 100.0000 mg | Freq: Two times a day (BID) | ORAL | Status: DC | PRN

## 2023-01-15 MED ORDER — PROPOFOL 1000 MG/100ML IV EMUL
0.0000 ug/kg/min | INTRAVENOUS | Status: DC
  Administered 2023-01-15: 5 ug/kg/min via INTRAVENOUS
  Administered 2023-01-16: 30 ug/kg/min via INTRAVENOUS
  Filled 2023-01-15 (×3): qty 100

## 2023-01-15 MED ORDER — KETAMINE HCL 50 MG/5ML IJ SOSY
PREFILLED_SYRINGE | INTRAMUSCULAR | Status: AC
  Administered 2023-01-15: 150 mg via INTRAVENOUS
  Filled 2023-01-15: qty 5

## 2023-01-15 MED ORDER — SODIUM CHLORIDE 0.9% IV SOLUTION: Freq: Once | INTRAVENOUS | Status: AC

## 2023-01-15 MED ORDER — FENTANYL CITRATE PF 50 MCG/ML IJ SOSY
100.0000 ug | PREFILLED_SYRINGE | Freq: Once | INTRAMUSCULAR | Status: AC
  Administered 2023-01-15: 100 ug via INTRAVENOUS

## 2023-01-15 MED ORDER — FENTANYL CITRATE PF 50 MCG/ML IJ SOSY
PREFILLED_SYRINGE | INTRAMUSCULAR | Status: AC
  Filled 2023-01-15: qty 2

## 2023-01-15 MED ORDER — MIDAZOLAM HCL 2 MG/2ML IJ SOLN
2.0000 mg | Freq: Once | INTRAMUSCULAR | Status: AC
  Administered 2023-01-15: 2 mg via INTRAVENOUS
  Filled 2023-01-15: qty 2

## 2023-01-15 MED ORDER — MIDAZOLAM HCL 2 MG/2ML IJ SOLN: 1.0000 mg | Freq: Once | INTRAMUSCULAR | Status: DC

## 2023-01-15 MED ORDER — ONDANSETRON HCL 4 MG/2ML IJ SOLN
4.0000 mg | Freq: Four times a day (QID) | INTRAMUSCULAR | Status: DC | PRN
  Administered 2023-01-16: 4 mg via INTRAVENOUS
  Filled 2023-01-15: qty 2

## 2023-01-15 MED ORDER — POLYETHYLENE GLYCOL 3350 17 G PO PACK: 17.0000 g | PACK | Freq: Every day | ORAL | Status: DC | PRN

## 2023-01-15 MED ORDER — METOCLOPRAMIDE HCL 5 MG/ML IJ SOLN
10.0000 mg | Freq: Once | INTRAMUSCULAR | Status: AC
  Administered 2023-01-15: 10 mg via INTRAVENOUS
  Filled 2023-01-15: qty 2

## 2023-01-15 MED ORDER — PANTOPRAZOLE 80MG IVPB - SIMPLE MED
80.0000 mg | Freq: Once | INTRAVENOUS | Status: DC
  Filled 2023-01-15 (×2): qty 100

## 2023-01-15 MED ORDER — FENTANYL CITRATE PF 50 MCG/ML IJ SOSY
25.0000 ug | PREFILLED_SYRINGE | INTRAMUSCULAR | Status: DC | PRN
  Administered 2023-01-15 (×2): 50 ug via INTRAVENOUS
  Administered 2023-01-16 (×4): 100 ug via INTRAVENOUS
  Filled 2023-01-15 (×2): qty 1
  Filled 2023-01-15 (×4): qty 2

## 2023-01-15 MED ORDER — PANTOPRAZOLE INFUSION (NEW) - SIMPLE MED
8.0000 mg/h | INTRAVENOUS | Status: AC
  Administered 2023-01-15 – 2023-01-18 (×5): 8 mg/h via INTRAVENOUS
  Filled 2023-01-15 (×9): qty 100

## 2023-01-15 MED ORDER — SODIUM CHLORIDE 0.9% IV SOLUTION: Freq: Once | INTRAVENOUS | Status: DC

## 2023-01-15 MED ORDER — ORAL CARE MOUTH RINSE
15.0000 mL | OROMUCOSAL | Status: DC
  Administered 2023-01-15 – 2023-01-16 (×10): 15 mL via OROMUCOSAL

## 2023-01-15 MED ORDER — FENTANYL CITRATE PF 50 MCG/ML IJ SOSY: 25.0000 ug | PREFILLED_SYRINGE | INTRAMUSCULAR | Status: DC | PRN

## 2023-01-15 MED ORDER — PANTOPRAZOLE INFUSION (NEW) - SIMPLE MED
8.0000 mg/h | INTRAVENOUS | Status: DC
  Filled 2023-01-15 (×2): qty 100

## 2023-01-15 MED ORDER — POLYETHYLENE GLYCOL 3350 17 G PO PACK
17.0000 g | PACK | Freq: Every day | ORAL | Status: DC
  Administered 2023-01-18: 17 g
  Filled 2023-01-15 (×3): qty 1

## 2023-01-15 MED ORDER — PROPOFOL 1000 MG/100ML IV EMUL
INTRAVENOUS | Status: AC
  Filled 2023-01-15: qty 100

## 2023-01-15 MED ORDER — PANTOPRAZOLE 80MG IVPB - SIMPLE MED
80.0000 mg | Freq: Once | INTRAVENOUS | Status: AC
  Administered 2023-01-15: 80 mg via INTRAVENOUS
  Filled 2023-01-15: qty 100

## 2023-01-15 MED ORDER — SODIUM CHLORIDE 0.9 % IV SOLN: INTRAVENOUS | Status: DC

## 2023-01-15 MED ORDER — KETAMINE HCL 50 MG/5ML IJ SOSY: 150.0000 mg | PREFILLED_SYRINGE | Freq: Once | INTRAMUSCULAR | Status: AC

## 2023-01-15 MED ORDER — NOREPINEPHRINE 4 MG/250ML-% IV SOLN
INTRAVENOUS | Status: AC
  Filled 2023-01-15: qty 250

## 2023-01-15 MED ORDER — PANTOPRAZOLE SODIUM 40 MG IV SOLR: 40.0000 mg | Freq: Two times a day (BID) | INTRAVENOUS | Status: DC

## 2023-01-15 MED ORDER — SODIUM CHLORIDE 0.9 % IV SOLN: 250.0000 mL | INTRAVENOUS | Status: DC

## 2023-01-15 MED ORDER — DOCUSATE SODIUM 100 MG PO CAPS: 100.0000 mg | ORAL_CAPSULE | Freq: Two times a day (BID) | ORAL | Status: DC | PRN

## 2023-01-15 MED ORDER — ROCURONIUM BROMIDE 50 MG/5ML IV SOLN
80.0000 mg | Freq: Once | INTRAVENOUS | Status: AC
  Filled 2023-01-15: qty 8

## 2023-01-15 MED ORDER — MIDAZOLAM HCL 2 MG/2ML IJ SOLN
1.0000 mg | Freq: Once | INTRAMUSCULAR | Status: AC
  Administered 2023-01-15: 1 mg via INTRAVENOUS

## 2023-01-15 SURGICAL SUPPLY — 15 items
BLOCK BITE 60FR ADLT L/F BLUE (MISCELLANEOUS) ×1 IMPLANT
ELECT REM PT RETURN 9FT ADLT (ELECTROSURGICAL) IMPLANT
ELECTRODE REM PT RTRN 9FT ADLT (ELECTROSURGICAL) IMPLANT
FORCEP RJ3 GP 1.8X160 W-NEEDLE (CUTTING FORCEPS) IMPLANT
FORCEPS BIOP RAD 4 LRG CAP 4 (CUTTING FORCEPS) IMPLANT
NDL SCLEROTHERAPY 25GX240 (NEEDLE) IMPLANT
NEEDLE SCLEROTHERAPY 25GX240 (NEEDLE) IMPLANT
PROBE APC STR FIRE (PROBE) IMPLANT
PROBE INJECTION GOLD (MISCELLANEOUS)
PROBE INJECTION GOLD 7FR (MISCELLANEOUS) IMPLANT
SNARE SHORT THROW 13M SML OVAL (MISCELLANEOUS) IMPLANT
SYR 50ML LL SCALE MARK (SYRINGE) IMPLANT
TUBING ENDO SMARTCAP PENTAX (MISCELLANEOUS) ×2 IMPLANT
TUBING IRRIGATION ENDOGATOR (MISCELLANEOUS) ×1 IMPLANT
WATER STERILE IRR 1000ML POUR (IV SOLUTION) IMPLANT

## 2023-01-15 NOTE — Progress Notes (Signed)
PROGRESS NOTE   Subjective/Complaints: Pt had 2 maroon stools- very large this AM- and vomited 2x- Also having maroon discharge from PEG per nursing..   Got called at 7am this AM Ordered Brillenta to be held and ASA-  Assessed pt- pt reports "done all in" from stools this AM- scared because had GI bleed in past and "almost died"  Ordered STAT labs- BMP and CBC- called GI upon seeing pt, even though labs were not back- there was a staffing issue with phlebotomy per charge nurse, which delayed labs- called rapid response and spoke to them myself- they came to bedside- even though RR 22-29- and BP 20N systolic (on midodrine already) placed order for Protonix IV BID and IVFs 100cc/hour since doesn't have heart issues per pt.  Also made NPO- pt didn't have IV, so rapid response placed 2 per my request and drew the labs for [place]-   As soon as got labs back, called ICU/ Critical care to transfer pt to ICU. Due to Hb of 6.2 down from 9.0.     Was there when they assessed pt- from ICU- said since BP OK and HR in 90s- to wait to transfuse blood not specifically "for him"- and get IVFs/Protonix gtt going-  Called GI back and let them know the labs as well- they planned on scoping once got blood.   However Pt did get Brilenta yesterday and ASA, which put him at risk of bleed.     Ros: limited by lethargy Objective:   DG CHEST PORT 1 VIEW  Result Date: 01/15/2023 CLINICAL DATA:  Endotracheal tube EXAM: PORTABLE CHEST 1 VIEW COMPARISON:  01/06/2023 FINDINGS: Interval placement of endotracheal tube, tip positioned below the thoracic inlet. The heart size and mediastinal contours are within normal limits. Both lungs are clear. Chronic fracture deformity of the mid right clavicle. IMPRESSION: 1. Interval placement of endotracheal tube, tip positioned below the thoracic inlet. 2. No acute abnormality of the lungs in AP portable projection.  Electronically Signed   By: Delanna Ahmadi M.D.   On: 01/15/2023 15:21   Recent Labs    01/15/23 0945  WBC 10.6*  HGB 6.2*  HCT 18.4*  PLT 291    Recent Labs    01/13/23 0547 01/15/23 0945  NA 136 136  K 3.8 4.6  CL 101 102  CO2 25 23  GLUCOSE 186* 154*  BUN 28* 55*  CREATININE 0.88 0.99  CALCIUM 8.5* 8.2*    Intake/Output Summary (Last 24 hours) at 01/15/2023 1633 Last data filed at 01/15/2023 1625 Gross per 24 hour  Intake 3438.25 ml  Output 400 ml  Net 3038.25 ml        Physical Exam: Vital Signs Blood pressure (!) 115/57, pulse 75, temperature 97.9 F (36.6 C), temperature source Axillary, resp. rate 14, SpO2 100 %.    General: awake, alert, appropriate,- supine in bed; c/o N/V x2 this AM and pt appears almost grey;  NAD HENT: conjunctivae very pale oropharynx dry- and tongue dry- lips decreased color CV: regular rate in 90s- regular rhythm; no JVD Pulmonary: CTA B/L; no W/R/R- good air movement- but c/o SOB- placed 2L O2 per rapid response GI:  soft, NT, ND, (+)BS Psychiatric: appropriate but very sleepy- worn out Neurological: Ox3 but lethargic Skin: Skin is warm and dry. +scarring on neck from prior trach, healed. +2X R forearm Ivs. Scab on left forearm MSK: Severe ulnar deviation of fingers in L hand d/t RA; less pronounced on R hand.    Neurological:     Mental Status: He is alert.     Comments: Oriented to self, place,      RUE 3/5 shoulder abduction, 4-/5 elbow flexion, 3+/5 elbow extension, 2+/5 wrist extension, 1-/5 finger abduction, 2+/5 grip RLE 4-/5 hip flexion, 5-/5 knee extension, 5/5 ankle DF, PF. Ambulating MinA without AD Right CMC joint TTP- velcro wrist splint in place   Assessment/Plan: 1. Functional deficits which require 3+ hours per day of interdisciplinary therapy in a comprehensive inpatient rehab setting. Physiatrist is providing close team supervision and 24 hour management of active medical problems listed below. Physiatrist  and rehab team continue to assess barriers to discharge/monitor patient progress toward functional and medical goals  Care Tool:  Bathing              Bathing assist       Upper Body Dressing/Undressing Upper body dressing        Upper body assist      Lower Body Dressing/Undressing Lower body dressing            Lower body assist       Toileting Toileting    Toileting assist       Transfers Chair/bed transfer  Transfers assist           Locomotion Ambulation   Ambulation assist              Walk 10 feet activity   Assist           Walk 50 feet activity   Assist           Walk 150 feet activity   Assist           Walk 10 feet on uneven surface  activity   Assist           Wheelchair     Assist               Wheelchair 50 feet with 2 turns activity    Assist            Wheelchair 150 feet activity     Assist          Blood pressure (!) 115/57, pulse 75, temperature 97.9 F (36.6 C), temperature source Axillary, resp. rate 14, SpO2 100 %.    Medical Problem List and Plan: 1. 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  Antithrombotics: -DVT/anticoagulation:  Mechanical: Sequential compression devices, below knee Bilateral lower extremities due to recurrent GIB             -antiplatelet therapy:  Brilinta and ASA 3. 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 Mood/Behavior/Sleep: LCSW to follow for evaluation and support.              -antipsychotic agents: N/A 5. Neuropsych/cognition: This patient is not fully capable of making decisions on his own behalf. 6. Skin/Wound Care: Routine pressure  relief. Continue tube feeds             --Question patient awareness/continence.   --Will d/c in am and start toileting  program.  7. Fluids/Electrolytes/Nutrition: Continue NPO with tube feeds.              --he reports that he was cleared for ice by [facility]. Will reach out to confirm; NPO in interim given high aspiration risk. --On continuous tube feed-->will change CBGs to every 4 hours. .  8. Left carotid stent: Continue ASA/Brilinta.  9. 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 Resume PPI but not able to crush for tube, pharmacy no longer has suspension   Would do 12wks of PPI, not able to cruch pantoprazole , pt wanted IV removed due to discomfort . Will try nexium cap via tube  May d/c famotidine when PPI available    Latest Ref Rng & Units 01/15/2023    9:45 AM 01/07/2023    6:08 AM 01/05/2023    3:58 AM  CBC  WBC 4.0 - 10.5 K/uL 10.6  9.0  6.9   Hemoglobin 13.0 - 17.0 g/dL 6.2  9.0  8.5   Hematocrit 39.0 - 52.0 % 18.4  27.8  25.8   Platelets 150 - 400 K/uL 291  232  142      11. Thrombocytopenia:Continue to monitor as have ranged187-->94-->142.             --monitor for signs of bleeding.  12. Aspiration PNA/Pneumonitis: On stress dose steriods D# 4/5.              -- 1/11 CXR 2 view w/ongoing frothy cough -> Decreased left-greater-than-right basilar opacities, likely resolving aspiration. Recommend follow-up chest x-ray in 3-4 weeks to ensure complete resolution.             --encourage flutter valve every 4 hrs WA.   13. T2DM: Hgb A1c-6.2. Monitor CBGs every 4 hours and use SSI for elevated BS             --on Prednisone to taper off in 24 hours. Monitor for improvement off this.       Recent Labs    01/06/23 0344 01/06/23 0808 01/06/23 1145  GLUCAP 169* 225* 116*   CBGs >200: d/c feeding supplement   14.Influenza A: Completed Tamiflu X 5 days 15. RA: Was on monthly Orenzia w/ prednisone to 1 mg daily as well as methotrexate and tadalafil. CXR reviewed and shows resolving infiltrate.               --taper to home dose 01/13.  16. Encounter for palliative care: Code status: Full code per palliative care notes 12/15/22. Re-consulted palliative care to discuss goals of care given decline since SNF stay  17.  Dysphagia - chronic PTA, s/p G tube, NPO, continue tube feeds as per RD recommendations. Discussed with nursing these should only run at night 18. Secretions: scopolamine patch ordered 19. Severe magnesium deficiency: oral supplements per tube added, continue  20. Hypotension: midodrine increased to '10mg'$  TID 21. Right hand weakness: improving, discussed with son   Plan as above- which was done to care for pt  I spent a total of 1 hour 35 minutes on total care with calling GI 2x, calling rapid response, calling ICU , staying with pt during ICU and rapid response evaluations  minutes on  total care today- >50% coordination of care- due to as above.    LOS: 0 days A FACE TO FACE EVALUATION WAS PERFORMED  Marjorie Lussier 01/15/2023, 4:33 PM

## 2023-01-15 NOTE — Plan of Care (Signed)
Full note to follow, pending admission to [facility] ICU   Called this morning for GIB, associated ABLA hgb 6.2 for this 77yo M who is in CIR following L MCA CVA and is on ASA Brilinta    Will admit to ICU , sounds like in light this hgb value GI plans for EGD   -STAT type and screen -coags pending -SBP goal > 90, MAP > 60  -defer emergent release product for now given current stability, will transfuse once admitted  (or if becomes unstable awaiting admission)    Eliseo Gum MSN, AGACNP-BC [place] 01/15/2023, 10:43 AM

## 2023-01-15 NOTE — Progress Notes (Addendum)
Pt had a large BM in the commode that was maroon colored.  When he got back to bed, he had another large maroon colored BM incontinently.  Peri care provided.  VS stable upon return to bed.   Ayesha Mohair BSN RN [facility] 01/15/2023, 6:33 AM  Secure chat sent to Dr. Ranell Patrick regarding maroon stools. Dr. Dagoberto Ligas added to secure chat. Ayesha Mohair BSN RN [facility] 01/15/2023, 6:36 AM  Dr. Dagoberto Ligas notified of above and order received for stat CBC and BMP. Ayesha Mohair BSN RN [facility] 01/15/2023, 6:55 AM

## 2023-01-15 NOTE — Plan of Care (Signed)
Patient had acute GI bleed, discharged to acute and unable to complete the rehab program

## 2023-01-15 NOTE — Consult Note (Signed)
Consultation Note   Referring Provider:  Dr. Alveta Heimlich PCP: Elaina Pattee, MD Primary Gastroenterologist: [facility]        Reason for consultation: Gi bleed   Hospital Day: 10  Patient profile:  [name] is a 77 y.o. 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 See PMH for any additional medical problems.    Assessment    # 77 yo male  with upper GI bleed on Brillinta. Two large maroon stools this am. Labs are pending. BP slightly lower than baseline but otherwise hemodynamically stable. We lavaged fresh blood lavaged from gastrostomy tube a few minutes ago.  Has history of severe esophagitis and non-bleeding duodenal ulcers in Aug 2023. DDx: PUD, bleeding from erosive esophagitis or may internal bumper on G tube?    # Hx of esophogeal strictures requiring repeated dilations. Followed by [facility]  # Gastrotrostomy tube in place for aspiration PNA.   # See PMH for additional medical problems  Plan   Brillinta on hold right now Has lost IV access, IV team has just gotten access Will need PPI infusion, IV fluids.  Updated labs pending.  Discussed with Dr. Alveta Heimlich from inpatient rehab. Recommend transfer to hospital, ? Maybe stepdown or ICU.  Keep NPO.  Will give dose of IV reglan to clear stomach Please make sure type / crossmatch is current Will likely need EGD today.. May require intubation.    HPI   Patient with a recent CVA now on Brillinta. He has been in inpatient rehab. Today he had two maroon stools. BP lower than his normal. Otherwise hemodynamically stable. No labs for today but they have been ordered. He had an episode of  non-bloody emesis this am. He doesn't have any abdominal pain.  Previous GI Evaluation    August 2023    Labs:  No results for input(s):  "WBC", "HGB", "HCT", "PLT" in the last 72 hours. Recent Labs    01/13/23 0547  NA 136  K 3.8  CL 101  CO2 25  GLUCOSE 186*  BUN 28*  CREATININE 0.88  CALCIUM 8.5*   No results for input(s): "PROT", "ALBUMIN", "AST", "ALT", "ALKPHOS", "BILITOT", "BILIDIR", "IBILI" in the last 72 hours. No results for input(s): "HEPBSAG", "HCVAB", "HEPAIGM", "HEPBIGM" in the last 72 hours. No results for input(s): "LABPROT", "INR" in the last 72 hours.  Past Medical History:  Diagnosis Date   Aortic aneurysm ([place]) 05/2017   Chronic back pain    Diabetes mellitus without complication (HCC)    Dyslipidemia    GERD (gastroesophageal reflux disease)    H/O hiatal hernia    Hypertension    Melanoma ([place])    "back; left arm"   PONV (postoperative nausea and vomiting)    Rheumatoid arthritis ([place])    Spasm of esophagus     Past Surgical History:  Procedure Laterality Date   aneursym repair     aneursym repair and stent placement  05/2017   BIOPSY  08/19/2022   Procedure: BIOPSY;  Surgeon: Ladene Artist, MD;  Location: [facility] ENDOSCOPY;  Service: Gastroenterology;;   CARPAL TUNNEL RELEASE  Bilateral    COLON SURGERY  2007   removed 18" of colon   ERCP W/ METAL STENT PLACEMENT  11/2005   [facility] Endo 12/14/2005   ESOPHAGOGASTRODUODENOSCOPY (EGD) WITH ESOPHAGEAL DILATION     "several times'   ESOPHAGOGASTRODUODENOSCOPY (EGD) WITH PROPOFOL N/A 08/19/2022   Procedure: ESOPHAGOGASTRODUODENOSCOPY (EGD) WITH PROPOFOL;  Surgeon: Ladene Artist, MD;  Location: [place];  Service: Gastroenterology;  Laterality: N/A;   HAND SURGERY Bilateral    "plastic knuckles"   IR ANGIO INTRA EXTRACRAN SEL COM CAROTID INNOMINATE UNI R MOD SED  01/03/2023   IR ANGIO VERTEBRAL SEL SUBCLAVIAN INNOMINATE UNI L MOD SED  01/03/2023   IR ANGIO VERTEBRAL SEL VERTEBRAL UNI R MOD SED  01/03/2023   IR INTRAVSC STENT CERV CAROTID W/EMB-PROT MOD SED INCL ANGIO  01/03/2023   IR US GUIDE VASC ACCESS RIGHT  01/03/2023   LAPAROSCOPIC  CHOLECYSTECTOMY  01/2006   MELANOMA EXCISION     "back; left arm"   NASAL SEPTUM SURGERY     PELVIC FRACTURE SURGERY  1964   "busted in 2 places"   RADIOLOGY WITH ANESTHESIA N/A 01/03/2023   Procedure: IR WITH ANESTHESIA;  Surgeon: Radiologist, Medication, MD;  Location: [place];  Service: Radiology;  Laterality: N/A;   RESECTION DISTAL CLAVICAL Right 05/13/2016   Procedure: DISTAL CLAVICLE EXCISION;  Surgeon: Ninetta Lights, MD;  Location: [place];  Service: Orthopedics;  Laterality: Right;   SHOULDER ARTHROSCOPY WITH ROTATOR CUFF REPAIR AND SUBACROMIAL DECOMPRESSION Right 05/13/2016   Procedure: RIGHT SHOULDER SCOPE DEBRIDEMENT, ACROMIOPLASTY, ROTATOR CUFF REPAIR; RELEASE BICEPS TENDON AND DEBRIDEMENT LABRUM;  Surgeon: Ninetta Lights, MD;  Location: [place];  Service: Orthopedics;  Laterality: Right;   TIBIA FRACTURE SURGERY Left 2006   "put a steel rod in it"    Family History  Problem Relation Age of Onset   Heart disease Mother    Heart disease Father    Cancer Maternal Grandmother     Prior to Admission medications   Medication Sig Start Date End Date Taking? Authorizing Provider  abatacept (ORENCIA) 250 MG injection Inject 250 mg into the vein every 30 (thirty) days. Patient taking differently: Inject 750 mg into the vein every 30 (thirty) days. 02/12/21   Sanjuan Dame, MD  alum & mag hydroxide-simeth (MAALOX/MYLANTA) 200-200-20 MG/5ML suspension Take 30 mLs by mouth 3 (three) times daily as needed for indigestion or heartburn. Patient not taking: Reported on 12/27/2022 08/27/22   Rick Duff, MD  aspirin 81 MG chewable tablet Place 1 tablet (81 mg total) into feeding tube daily. 01/06/23   Mapp, Claudia Desanctis, MD  atorvastatin (LIPITOR) 40 MG tablet Take 1 tablet (40 mg total) by mouth daily. 08/27/22   Leigh Aurora, DO  diclofenac Sodium (VOLTAREN) 1 % GEL Apply 2 g topically 4 (four) times daily. Patient not taking: Reported on 12/28/2022 08/27/22    Rick Duff, MD  famotidine (PEPCID) 20 MG tablet Take 1 tablet (20 mg total) by mouth daily. 08/27/22 08/27/23  Leigh Aurora, DO  Feeding Supplies KIT 1 Box by Does not apply route daily. Patient will need pump and supplies for continuous tube feeds and 67m syringes for free water flushes 12/31/22   Mapp, TClaudia Desanctis MD  ferrous sulfate 300 (60 Fe) MG/5ML syrup Take 5 mLs (300 mg total) by mouth daily. 08/27/22   PLeigh Aurora DO  folic acid (FOLVITE) 1 MG tablet Take 1 tablet (1 mg total) by mouth daily. 08/27/22   PLeigh Aurora DO  levothyroxine (  SYNTHROID) 50 MCG tablet Take 1 tablet (50 mcg total) by mouth daily. 08/27/22   Leigh Aurora, DO  lidocaine (XYLOCAINE) 2 % solution Use as directed 15 mLs in the mouth or throat 3 (three) times daily as needed for mouth pain (Indigestion). Patient not taking: Reported on 12/28/2022 08/27/22   Rick Duff, MD  magnesium oxide (MAG-OX) 400 (240 Mg) MG tablet Take 1 tablet (400 mg total) by mouth daily. 08/27/22   Leigh Aurora, DO  metFORMIN (GLUCOPHAGE) 500 MG tablet Take 1 tablet (500 mg total) by mouth daily. 08/27/22   Leigh Aurora, DO  Methotrexate 25 MG/ML SOSY Inject 15 mg into the skin once a week.    [provider]  Nutritional Supplements (FEEDING SUPPLEMENT, OSMOLITE 1.5 CAL,) LIQD Place 1,000 mLs into feeding tube continuous. For 18 hours daily. 12/31/22   Mapp, Claudia Desanctis, MD  Nutritional Supplements (FEEDING SUPPLEMENT, OSMOLITE 1.5 CAL,) LIQD Place 1,000 mLs into feeding tube daily. 01/06/23   Mapp, Claudia Desanctis, MD  ondansetron (ZOFRAN) 4 MG tablet Take 1 tablet (4 mg total) by mouth every 12 (twelve) hours as needed for nausea or vomiting. 08/27/22   Leigh Aurora, DO  oxyCODONE (OXY IR/ROXICODONE) 5 MG immediate release tablet Take 1 tablet (5 mg total) by mouth every 4 (four) hours as needed for moderate pain. 08/27/22   Rick Duff, MD  pantoprazole (PROTONIX) 40 MG tablet Take 1 tablet (40 mg total) by mouth 2 (two) times daily. Via tube 08/27/22   Rick Duff, MD  predniSONE (DELTASONE) 10 MG tablet Place 1 tablet (10 mg total) into feeding tube daily for 5 days, THEN 0.5 tablets (5 mg total) daily for 25 days. 01/06/23 02/05/23  Mapp, Claudia Desanctis, MD  Protein (FEEDING SUPPLEMENT, PROSOURCE TF20,) liquid Place 60 mLs into feeding tube daily. 01/07/23   Mapp, Claudia Desanctis, MD  sucralfate (CARAFATE) 1 GM/10ML suspension Take 1 g by mouth 4 (four) times daily -  before meals and at bedtime.    [provider]  tadalafil (CIALIS) 5 MG tablet Take 2 tablets (10 mg total) by mouth daily. 08/27/22   Leigh Aurora, DO  ticagrelor (BRILINTA) 90 MG TABS tablet Place 1 tablet (90 mg total) into feeding tube 2 (two) times daily. 01/06/23   Mapp, Claudia Desanctis, MD  Water For Irrigation, Sterile (FREE WATER) SOLN Place 200 mLs into feeding tube every 4 (four) hours. 12/31/22   Mapp, Claudia Desanctis, MD  Water For Irrigation, Sterile (FREE WATER) SOLN Place 150 mLs into feeding tube every 4 (four) hours. 01/06/23   Mapp, Claudia Desanctis, MD    Current Facility-Administered Medications  Medication Dose Route Frequency Provider Last Rate Last Admin   0.9 %  sodium chloride infusion   Intravenous Continuous Lovorn, Jinny Blossom, MD       acetaminophen (TYLENOL) tablet 325-650 mg  325-650 mg Per Tube Q4H PRN Raulkar, Clide Deutscher, MD   650 mg at 01/13/23 1646   alum & mag hydroxide-simeth (MAALOX/MYLANTA) 200-200-20 MG/5ML suspension 30 mL  30 mL Per Tube Q4H PRN Raulkar, Clide Deutscher, MD       atorvastatin (LIPITOR) tablet 40 mg  40 mg Per Tube Daily [name], PA-C   40 mg at 01/15/23 0901   bisacodyl (DULCOLAX) suppository 10 mg  10 mg Rectal Daily PRN Love, Pamela S, PA-C       diphenhydrAMINE (BENADRYL) 12.5 MG/5ML elixir 12.5-25 mg  12.5-25 mg Per Tube Q6H PRN Raulkar, Clide Deutscher, MD       feeding supplement (OSMOLITE 1.5 CAL)  liquid 1,000 mL  1,000 mL Per Tube Q24H Raulkar, Clide Deutscher, MD   Infusion Verify at 01/14/23 1900   feeding supplement (PROSource TF20) liquid 60 mL  60 mL Per Tube Daily  Raulkar, Clide Deutscher, MD   60 mL at 01/15/23 0900   free water 150 mL  150 mL Per Tube Q4H Raulkar, Clide Deutscher, MD   150 mL at 01/15/23 0905   guaiFENesin-dextromethorphan (ROBITUSSIN DM) 100-10 MG/5ML syrup 5-10 mL  5-10 mL Per Tube Q6H PRN Izora Ribas, MD   10 mL at 01/12/23 2109   insulin aspart (novoLOG) injection 0-9 Units  0-9 Units Subcutaneous Q4H [name], PA-C   2 Units at 01/15/23 6237   levothyroxine (SYNTHROID) tablet 50 mcg  50 mcg Per Tube [id number] [name], PA-C   50 mcg at 01/15/23 5176   lip balm (BLISTEX) ointment   Topical PRN Raulkar, Clide Deutscher, MD       loperamide HCl (IMODIUM) 1 MG/7.5ML suspension 2 mg  2 mg Per Tube PRN Raulkar, Clide Deutscher, MD       magnesium oxide (MAG-OX) tablet 400 mg  400 mg Per Tube BID Love, Pamela S, PA-C   400 mg at 01/15/23 0901   midodrine (PROAMATINE) tablet 10 mg  10 mg Per Tube TID WC Raulkar, Clide Deutscher, MD   10 mg at 01/15/23 0901   modafinil (PROVIGIL) tablet 200 mg  200 mg Per Tube Daily Raulkar, Clide Deutscher, MD   200 mg at 01/15/23 0901   Oral care mouth rinse  15 mL Mouth Rinse 4 times per day Love, Pamela S, PA-C   15 mL at 01/15/23 0905   pantoprazole (PROTONIX) injection 40 mg  40 mg Intravenous Q12H Lovorn, Megan, MD       polyethylene glycol (MIRALAX / GLYCOLAX) packet 17 g  17 g Per Tube Daily PRN Raulkar, Clide Deutscher, MD   17 g at 01/14/23 0606   predniSONE (DELTASONE) tablet 1 mg  1 mg Per Tube Q breakfast Love, Pamela S, PA-C   1 mg at 01/15/23 0901   prochlorperazine (COMPAZINE) tablet 5-10 mg  5-10 mg Per Tube Q6H PRN Raulkar, Clide Deutscher, MD       Or   prochlorperazine (COMPAZINE) injection 5-10 mg  5-10 mg Intramuscular Q6H PRN Raulkar, Clide Deutscher, MD   10 mg at 01/15/23 1607   Or   prochlorperazine (COMPAZINE) suppository 12.5 mg  12.5 mg Rectal Q6H PRN Raulkar, Clide Deutscher, MD       scopolamine (TRANSDERM-SCOP) 1 MG/3DAYS 1.5 mg  1 patch Transdermal Q72H Raulkar, Clide Deutscher, MD   1.5 mg at 01/14/23 1812   sodium  phosphate (FLEET) 7-19 GM/118ML enema 1 enema  1 enema Rectal Once PRN Love, Pamela S, PA-C       traZODone (DESYREL) tablet 25-50 mg  25-50 mg Per Tube QHS PRN Raulkar, Clide Deutscher, MD   50 mg at 01/13/23 2023    Allergies as of 01/06/2023   (No Known Allergies)    Social History   Socioeconomic History   Marital status: Divorced    Spouse name: Not on file   Number of children: Not on file   Years of education: Not on file   Highest education level: Not on file  Occupational History   Not on file  Tobacco Use   Smoking status: Former    Packs/day: 1.00    Years: 46.00    Total pack years: 46.00  Types: Cigarettes    Quit date: 11/26/2005    Years since quitting: 17.1   Smokeless tobacco: Never  Substance and Sexual Activity   Alcohol use: Yes    Alcohol/week: 9.0 standard drinks of alcohol    Types: 9 Shots of liquor per week    Comment: 05/23/2014 "3, 1 shot drinks maybe 3 times/wk"   Drug use: No   Sexual activity: Not Currently  Other Topics Concern   Not on file  Social History Narrative   Not on file   Social Determinants of Health   Financial Resource Strain: Not on file  Food Insecurity: No Food Insecurity (12/28/2022)   Hunger Vital Sign    Worried About Running Out of Food in the Last Year: Never true    Ran Out of Food in the Last Year: Never true  Transportation Needs: No Transportation Needs (12/28/2022)   PRAPARE - Hydrologist (Medical): No    Lack of Transportation (Non-Medical): No  Physical Activity: Not on file  Stress: Not on file  Social Connections: Not on file  Intimate Partner Violence: Not At Risk (12/28/2022)   Humiliation, Afraid, Rape, and Kick questionnaire    Fear of Current or Ex-Partner: No    Emotionally Abused: No    Physically Abused: No    Sexually Abused: No    Review of Systems: All systems reviewed and negative except where noted in HPI.  Physical Exam: Vital signs in last 24 hours: Temp:   [98.2 F (36.8 C)-98.5 F (36.9 C)] 98.2 F (36.8 C) (01/20 0839) Pulse Rate:  [76-95] 90 (01/20 0839) Resp:  [17-22] 22 (01/20 0839) BP: (97-135)/(51-63) 98/55 (01/20 0839) SpO2:  [99 %-100 %] 100 % (01/20 0839) Last BM Date : 01/14/23  General:  Alert thin male in NAD Psych:  Pleasant, cooperative. Normal mood and affect Eyes: Pupils equal Ears:  Normal auditory acuity Nose: No deformity, discharge or lesions Neck:  Supple, no masses felt Lungs:  Clear to auscultation.  Heart:  Regular rate, regular rhythm.  Abdomen:  Soft, nondistended, nontender, active bowel sounds, no masses felt. Fresh blood aspirated from g-tube Rectal :  Deferred Msk: Symmetrical without gross deformities.  Neurologic:  Alert, oriented, grossly normal neurologically Extremities : No edema Skin:  Intact without significant lesions.    Intake/Output from previous day: 01/19 0701 - 01/20 0700 In: 3091.6 [NG/GT:3091.6] Out: 725 [Urine:725] Intake/Output this shift:  No intake/output data recorded.    Principal Problem:   Acute ischemic left middle cerebral artery (MCA) stroke [facility]) Active Problems:   Dysphagia, pharyngoesophageal phase    Tye Savoy, NP-C @  01/15/2023, 9:24 AM

## 2023-01-15 NOTE — Op Note (Signed)
[facility] Patient Name: [name] Procedure Date : 01/15/2023 MRN: [id number] Attending MD: Carlota Raspberry. Havery Moros , MD, [id number] Date of Birth: 1946-11-13 CSN: [id number] Age: 77 Admit Type: Inpatient Procedure:                Upper GI endoscopy Indications:              Upper gastrointestinal bleeding - recently placed                            on Brilinta - now with melena and bright red blood                            coming out of PEG. Patient intubated for airway                            protection. Providers:                Carlota Raspberry. Havery Moros, MD, Janee Morn,                            Technician, Jennefer Bravo, RN Referring MD:              Medicines:                Monitored Anesthesia Care Complications:            No immediate complications. Estimated blood loss:                            Minimal. Estimated Blood Loss:     Estimated blood loss was minimal. Procedure:                Pre-Anesthesia Assessment:                           - Prior to the procedure, a History and Physical                            was performed, and patient medications and                            allergies were reviewed. The patient's tolerance of                            previous anesthesia was also reviewed. The risks                            and benefits of the procedure and the sedation                            options and risks were discussed with the patient.                            All questions were answered, and informed consent  was obtained. Prior Anticoagulants: The patient has                            taken antiplatelet medication (Brilinta), last dose                            was 1 day prior to procedure. ASA Grade Assessment:                            IV - A patient with severe systemic disease that is                            a constant threat to life. After reviewing the                             risks and benefits, the patient was deemed in                            satisfactory condition to undergo the procedure.                           After obtaining informed consent, the endoscope was                            passed under direct vision. Throughout the                            procedure, the patient's blood pressure, pulse, and                            oxygen saturations were monitored continuously. The                            [id number]-H190 ([id number]) Olympus endoscope was introduced                            through the mouth, with the intention of advancing                            to the duodenum. The scope was advanced to the                            lower third of the esophagus before the procedure                            was aborted. Medications were given. The upper GI                            endoscopy was accomplished without difficulty. The                            patient tolerated the procedure well. Scope In: Scope Out: Findings:      Significant  fluid / secretions was found in the entire esophagus -       needed extensive suctioning to even visualize the esophagus, indicating       poor clearance / stasis of esophageal secretions.      One benign-appearing, intrinsic severe stenosis was found at the GEJ.       The stenosis was not traversed, was pinhole lumen. Could not be       traversed with the upper endoscope. This was withdrawn and pediatric       upper endoscope placed which could also not traverse the stenosis. The       stenosis was friable with contact oozing and superficial mucosal trauma       noted from the endoscope. Dilation not performed given recent       antiplatelet use and risk for worsening bleeding.      The exam of the esophagus was otherwise normal. Impression:               - Fluid in the esophagus indicating stasis / poor                            esophageal clearance of secretions.                           - Severe  benign-appearing esophageal stenosis -                            pinhole lumen at the distal esophagus could not                            traverse with either regular endoscope or pediatric                            endoscope. Could not dilate the stricture given                            recent antiplatelet use and risk for bleeding.                           Unfortunately, in light of distal esophageal                            stricture, cannot access the patient's stomach                            endoscopically to address his bleeding. If he has                            ongoing bleeding recommend CTA with IR embolization                            if amenable. Otherwise consider platelet                            transfusion given recent Brilinta use. Surgery is  last resort for refractory bleeding. Recommendation:           - Remain ICU for ongoing care.                           - NPO.                           - Continue present medications (IV Protonix)                           - Hold Brilinta                           - CTA if persistent bleeding                           - Consider platelet transfusion                           - Discussed findings and recommendations with the                            patient's son and critical care team Procedure Code(s):        --- Professional ---                           [phone], 52, Esophagogastroduodenoscopy, flexible,                            transoral; diagnostic, including collection of                            specimen(s) by brushing or washing, when performed                            (separate procedure) Diagnosis Code(s):        --- Professional ---                           K22.2, Esophageal obstruction CPT copyright 2022 American Medical Association. All rights reserved. The codes documented in this report are preliminary and upon coder review may  be revised to meet current compliance  requirements. Remo Lipps P. Reggie Bise, MD 01/15/2023 5:06:18 PM This report has been signed electronically. Number of Addenda: 0

## 2023-01-15 NOTE — Progress Notes (Signed)
Occupational Therapy Note  Patient Details  Name: KEYSHUN ELPERS MRN: [id number] Date of Birth: 12/24/1946  Today's Date: 01/15/2023 OT Missed Time: 70 Minutes Missed Time Reason: MD hold (comment);Patient ill (comment)  Occupational Therapy Discharge Note  This patient was unable to complete the inpatient rehab program due to medical issues; therefore did not meet their long term goals. Pt left the program at a min A assist level for their  functional ADLs. This patient is being discharged from OT services at this time.  BIMS at time of d/c  Pt unable to complete due to medical status  See CareTool for functional status details.  If the patient is able to return to inpatient rehabilitation within 3 midnights, this may be considered an interrupted stay and therapy services will resume as ordered. Modification and reinstatement of their goals will be made upon completion of therapy service reevaluations.     Willeen Cass [facility] 01/15/2023, 10:49 AM

## 2023-01-15 NOTE — Interval H&P Note (Signed)
History and Physical Interval Note: Patient intubated and given PRBC transfusion. Now in ICU. EGD to further evaluate, patient consented prior to the exam.  01/15/2023 3:52 PM  [name]  has presented today for surgery, with the diagnosis of Upper GI bleed.  The various methods of treatment have been discussed with the patient and family. After consideration of risks, benefits and other options for treatment, the patient has consented to  Procedure(s): ESOPHAGOGASTRODUODENOSCOPY (EGD) WITH PROPOFOL (N/A) as a surgical intervention.  The patient's history has been reviewed, patient examined, no change in status, stable for surgery.  I have reviewed the patient's chart and labs.  Questions were answered to the patient's satisfaction.     [place]

## 2023-01-15 NOTE — H&P (View-Only) (Signed)
Consultation Note   Referring Provider:  Dr. Alveta Heimlich PCP: Elaina Pattee, MD Primary Gastroenterologist: [facility]        Reason for consultation: Gi bleed   Hospital Day: 10  Patient profile:  [name] is a 77 y.o. 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 See PMH for any additional medical problems.    Assessment    # 77 yo male  with upper GI bleed on Brillinta. Two large maroon stools this am. Labs are pending. BP slightly lower than baseline but otherwise hemodynamically stable. We lavaged fresh blood lavaged from gastrostomy tube a few minutes ago.  Has history of severe esophagitis and non-bleeding duodenal ulcers in Aug 2023. DDx: PUD, bleeding from erosive esophagitis or may internal bumper on G tube?    # Hx of esophogeal strictures requiring repeated dilations. Followed by [facility]  # Gastrotrostomy tube in place for aspiration PNA.   # See PMH for additional medical problems  Plan   Brillinta on hold right now Has lost IV access, IV team has just gotten access Will need PPI infusion, IV fluids.  Updated labs pending.  Discussed with Dr. Alveta Heimlich from inpatient rehab. Recommend transfer to hospital, ? Maybe stepdown or ICU.  Keep NPO.  Will give dose of IV reglan to clear stomach Please make sure type / crossmatch is current Will likely need EGD today.. May require intubation.    HPI   Patient with a recent CVA now on Brillinta. He has been in inpatient rehab. Today he had two maroon stools. BP lower than his normal. Otherwise hemodynamically stable. No labs for today but they have been ordered. He had an episode of  non-bloody emesis this am. He doesn't have any abdominal pain.  Previous GI Evaluation    August 2023    Labs:  No results for input(s):  "WBC", "HGB", "HCT", "PLT" in the last 72 hours. Recent Labs    01/13/23 0547  NA 136  K 3.8  CL 101  CO2 25  GLUCOSE 186*  BUN 28*  CREATININE 0.88  CALCIUM 8.5*   No results for input(s): "PROT", "ALBUMIN", "AST", "ALT", "ALKPHOS", "BILITOT", "BILIDIR", "IBILI" in the last 72 hours. No results for input(s): "HEPBSAG", "HCVAB", "HEPAIGM", "HEPBIGM" in the last 72 hours. No results for input(s): "LABPROT", "INR" in the last 72 hours.  Past Medical History:  Diagnosis Date   Aortic aneurysm ([place]) 05/2017   Chronic back pain    Diabetes mellitus without complication (HCC)    Dyslipidemia    GERD (gastroesophageal reflux disease)    H/O hiatal hernia    Hypertension    Melanoma ([place])    "back; left arm"   PONV (postoperative nausea and vomiting)    Rheumatoid arthritis ([place])    Spasm of esophagus     Past Surgical History:  Procedure Laterality Date   aneursym repair     aneursym repair and stent placement  05/2017   BIOPSY  08/19/2022   Procedure: BIOPSY;  Surgeon: Ladene Artist, MD;  Location: [facility] ENDOSCOPY;  Service: Gastroenterology;;   CARPAL TUNNEL RELEASE  Bilateral    COLON SURGERY  2007   removed 18" of colon   ERCP W/ METAL STENT PLACEMENT  11/2005   [facility] Endo 12/14/2005   ESOPHAGOGASTRODUODENOSCOPY (EGD) WITH ESOPHAGEAL DILATION     "several times'   ESOPHAGOGASTRODUODENOSCOPY (EGD) WITH PROPOFOL N/A 08/19/2022   Procedure: ESOPHAGOGASTRODUODENOSCOPY (EGD) WITH PROPOFOL;  Surgeon: Ladene Artist, MD;  Location: [place];  Service: Gastroenterology;  Laterality: N/A;   HAND SURGERY Bilateral    "plastic knuckles"   IR ANGIO INTRA EXTRACRAN SEL COM CAROTID INNOMINATE UNI R MOD SED  01/03/2023   IR ANGIO VERTEBRAL SEL SUBCLAVIAN INNOMINATE UNI L MOD SED  01/03/2023   IR ANGIO VERTEBRAL SEL VERTEBRAL UNI R MOD SED  01/03/2023   IR INTRAVSC STENT CERV CAROTID W/EMB-PROT MOD SED INCL ANGIO  01/03/2023   IR US GUIDE VASC ACCESS RIGHT  01/03/2023   LAPAROSCOPIC  CHOLECYSTECTOMY  01/2006   MELANOMA EXCISION     "back; left arm"   NASAL SEPTUM SURGERY     PELVIC FRACTURE SURGERY  1964   "busted in 2 places"   RADIOLOGY WITH ANESTHESIA N/A 01/03/2023   Procedure: IR WITH ANESTHESIA;  Surgeon: Radiologist, Medication, MD;  Location: [place];  Service: Radiology;  Laterality: N/A;   RESECTION DISTAL CLAVICAL Right 05/13/2016   Procedure: DISTAL CLAVICLE EXCISION;  Surgeon: Ninetta Lights, MD;  Location: [place];  Service: Orthopedics;  Laterality: Right;   SHOULDER ARTHROSCOPY WITH ROTATOR CUFF REPAIR AND SUBACROMIAL DECOMPRESSION Right 05/13/2016   Procedure: RIGHT SHOULDER SCOPE DEBRIDEMENT, ACROMIOPLASTY, ROTATOR CUFF REPAIR; RELEASE BICEPS TENDON AND DEBRIDEMENT LABRUM;  Surgeon: Ninetta Lights, MD;  Location: [place];  Service: Orthopedics;  Laterality: Right;   TIBIA FRACTURE SURGERY Left 2006   "put a steel rod in it"    Family History  Problem Relation Age of Onset   Heart disease Mother    Heart disease Father    Cancer Maternal Grandmother     Prior to Admission medications   Medication Sig Start Date End Date Taking? Authorizing Provider  abatacept (ORENCIA) 250 MG injection Inject 250 mg into the vein every 30 (thirty) days. Patient taking differently: Inject 750 mg into the vein every 30 (thirty) days. 02/12/21   Sanjuan Dame, MD  alum & mag hydroxide-simeth (MAALOX/MYLANTA) 200-200-20 MG/5ML suspension Take 30 mLs by mouth 3 (three) times daily as needed for indigestion or heartburn. Patient not taking: Reported on 12/27/2022 08/27/22   Rick Duff, MD  aspirin 81 MG chewable tablet Place 1 tablet (81 mg total) into feeding tube daily. 01/06/23   Mapp, Claudia Desanctis, MD  atorvastatin (LIPITOR) 40 MG tablet Take 1 tablet (40 mg total) by mouth daily. 08/27/22   Leigh Aurora, DO  diclofenac Sodium (VOLTAREN) 1 % GEL Apply 2 g topically 4 (four) times daily. Patient not taking: Reported on 12/28/2022 08/27/22    Rick Duff, MD  famotidine (PEPCID) 20 MG tablet Take 1 tablet (20 mg total) by mouth daily. 08/27/22 08/27/23  Leigh Aurora, DO  Feeding Supplies KIT 1 Box by Does not apply route daily. Patient will need pump and supplies for continuous tube feeds and 54m syringes for free water flushes 12/31/22   Mapp, TClaudia Desanctis MD  ferrous sulfate 300 (60 Fe) MG/5ML syrup Take 5 mLs (300 mg total) by mouth daily. 08/27/22   PLeigh Aurora DO  folic acid (FOLVITE) 1 MG tablet Take 1 tablet (1 mg total) by mouth daily. 08/27/22   PLeigh Aurora DO  levothyroxine (  SYNTHROID) 50 MCG tablet Take 1 tablet (50 mcg total) by mouth daily. 08/27/22   Leigh Aurora, DO  lidocaine (XYLOCAINE) 2 % solution Use as directed 15 mLs in the mouth or throat 3 (three) times daily as needed for mouth pain (Indigestion). Patient not taking: Reported on 12/28/2022 08/27/22   Rick Duff, MD  magnesium oxide (MAG-OX) 400 (240 Mg) MG tablet Take 1 tablet (400 mg total) by mouth daily. 08/27/22   Leigh Aurora, DO  metFORMIN (GLUCOPHAGE) 500 MG tablet Take 1 tablet (500 mg total) by mouth daily. 08/27/22   Leigh Aurora, DO  Methotrexate 25 MG/ML SOSY Inject 15 mg into the skin once a week.    [provider]  Nutritional Supplements (FEEDING SUPPLEMENT, OSMOLITE 1.5 CAL,) LIQD Place 1,000 mLs into feeding tube continuous. For 18 hours daily. 12/31/22   Mapp, Claudia Desanctis, MD  Nutritional Supplements (FEEDING SUPPLEMENT, OSMOLITE 1.5 CAL,) LIQD Place 1,000 mLs into feeding tube daily. 01/06/23   Mapp, Claudia Desanctis, MD  ondansetron (ZOFRAN) 4 MG tablet Take 1 tablet (4 mg total) by mouth every 12 (twelve) hours as needed for nausea or vomiting. 08/27/22   Leigh Aurora, DO  oxyCODONE (OXY IR/ROXICODONE) 5 MG immediate release tablet Take 1 tablet (5 mg total) by mouth every 4 (four) hours as needed for moderate pain. 08/27/22   Rick Duff, MD  pantoprazole (PROTONIX) 40 MG tablet Take 1 tablet (40 mg total) by mouth 2 (two) times daily. Via tube 08/27/22   Rick Duff, MD  predniSONE (DELTASONE) 10 MG tablet Place 1 tablet (10 mg total) into feeding tube daily for 5 days, THEN 0.5 tablets (5 mg total) daily for 25 days. 01/06/23 02/05/23  Mapp, Claudia Desanctis, MD  Protein (FEEDING SUPPLEMENT, PROSOURCE TF20,) liquid Place 60 mLs into feeding tube daily. 01/07/23   Mapp, Claudia Desanctis, MD  sucralfate (CARAFATE) 1 GM/10ML suspension Take 1 g by mouth 4 (four) times daily -  before meals and at bedtime.    [provider]  tadalafil (CIALIS) 5 MG tablet Take 2 tablets (10 mg total) by mouth daily. 08/27/22   Leigh Aurora, DO  ticagrelor (BRILINTA) 90 MG TABS tablet Place 1 tablet (90 mg total) into feeding tube 2 (two) times daily. 01/06/23   Mapp, Claudia Desanctis, MD  Water For Irrigation, Sterile (FREE WATER) SOLN Place 200 mLs into feeding tube every 4 (four) hours. 12/31/22   Mapp, Claudia Desanctis, MD  Water For Irrigation, Sterile (FREE WATER) SOLN Place 150 mLs into feeding tube every 4 (four) hours. 01/06/23   Mapp, Claudia Desanctis, MD    Current Facility-Administered Medications  Medication Dose Route Frequency Provider Last Rate Last Admin   0.9 %  sodium chloride infusion   Intravenous Continuous Lovorn, Jinny Blossom, MD       acetaminophen (TYLENOL) tablet 325-650 mg  325-650 mg Per Tube Q4H PRN Raulkar, Clide Deutscher, MD   650 mg at 01/13/23 1646   alum & mag hydroxide-simeth (MAALOX/MYLANTA) 200-200-20 MG/5ML suspension 30 mL  30 mL Per Tube Q4H PRN Raulkar, Clide Deutscher, MD       atorvastatin (LIPITOR) tablet 40 mg  40 mg Per Tube Daily [name], PA-C   40 mg at 01/15/23 0901   bisacodyl (DULCOLAX) suppository 10 mg  10 mg Rectal Daily PRN Love, Pamela S, PA-C       diphenhydrAMINE (BENADRYL) 12.5 MG/5ML elixir 12.5-25 mg  12.5-25 mg Per Tube Q6H PRN Raulkar, Clide Deutscher, MD       feeding supplement (OSMOLITE 1.5 CAL)  liquid 1,000 mL  1,000 mL Per Tube Q24H Raulkar, Clide Deutscher, MD   Infusion Verify at 01/14/23 1900   feeding supplement (PROSource TF20) liquid 60 mL  60 mL Per Tube Daily  Raulkar, Clide Deutscher, MD   60 mL at 01/15/23 0900   free water 150 mL  150 mL Per Tube Q4H Raulkar, Clide Deutscher, MD   150 mL at 01/15/23 0905   guaiFENesin-dextromethorphan (ROBITUSSIN DM) 100-10 MG/5ML syrup 5-10 mL  5-10 mL Per Tube Q6H PRN Izora Ribas, MD   10 mL at 01/12/23 2109   insulin aspart (novoLOG) injection 0-9 Units  0-9 Units Subcutaneous Q4H [name], PA-C   2 Units at 01/15/23 5009   levothyroxine (SYNTHROID) tablet 50 mcg  50 mcg Per Tube [id number] [name], PA-C   50 mcg at 01/15/23 9937   lip balm (BLISTEX) ointment   Topical PRN Raulkar, Clide Deutscher, MD       loperamide HCl (IMODIUM) 1 MG/7.5ML suspension 2 mg  2 mg Per Tube PRN Raulkar, Clide Deutscher, MD       magnesium oxide (MAG-OX) tablet 400 mg  400 mg Per Tube BID Love, Pamela S, PA-C   400 mg at 01/15/23 0901   midodrine (PROAMATINE) tablet 10 mg  10 mg Per Tube TID WC Raulkar, Clide Deutscher, MD   10 mg at 01/15/23 0901   modafinil (PROVIGIL) tablet 200 mg  200 mg Per Tube Daily Raulkar, Clide Deutscher, MD   200 mg at 01/15/23 0901   Oral care mouth rinse  15 mL Mouth Rinse 4 times per day Love, Pamela S, PA-C   15 mL at 01/15/23 0905   pantoprazole (PROTONIX) injection 40 mg  40 mg Intravenous Q12H Lovorn, Megan, MD       polyethylene glycol (MIRALAX / GLYCOLAX) packet 17 g  17 g Per Tube Daily PRN Raulkar, Clide Deutscher, MD   17 g at 01/14/23 0606   predniSONE (DELTASONE) tablet 1 mg  1 mg Per Tube Q breakfast Love, Pamela S, PA-C   1 mg at 01/15/23 0901   prochlorperazine (COMPAZINE) tablet 5-10 mg  5-10 mg Per Tube Q6H PRN Raulkar, Clide Deutscher, MD       Or   prochlorperazine (COMPAZINE) injection 5-10 mg  5-10 mg Intramuscular Q6H PRN Raulkar, Clide Deutscher, MD   10 mg at 01/15/23 1696   Or   prochlorperazine (COMPAZINE) suppository 12.5 mg  12.5 mg Rectal Q6H PRN Raulkar, Clide Deutscher, MD       scopolamine (TRANSDERM-SCOP) 1 MG/3DAYS 1.5 mg  1 patch Transdermal Q72H Raulkar, Clide Deutscher, MD   1.5 mg at 01/14/23 1812   sodium  phosphate (FLEET) 7-19 GM/118ML enema 1 enema  1 enema Rectal Once PRN Love, Pamela S, PA-C       traZODone (DESYREL) tablet 25-50 mg  25-50 mg Per Tube QHS PRN Raulkar, Clide Deutscher, MD   50 mg at 01/13/23 2023    Allergies as of 01/06/2023   (No Known Allergies)    Social History   Socioeconomic History   Marital status: Divorced    Spouse name: Not on file   Number of children: Not on file   Years of education: Not on file   Highest education level: Not on file  Occupational History   Not on file  Tobacco Use   Smoking status: Former    Packs/day: 1.00    Years: 46.00    Total pack years: 46.00  Types: Cigarettes    Quit date: 11/26/2005    Years since quitting: 17.1   Smokeless tobacco: Never  Substance and Sexual Activity   Alcohol use: Yes    Alcohol/week: 9.0 standard drinks of alcohol    Types: 9 Shots of liquor per week    Comment: 05/23/2014 "3, 1 shot drinks maybe 3 times/wk"   Drug use: No   Sexual activity: Not Currently  Other Topics Concern   Not on file  Social History Narrative   Not on file   Social Determinants of Health   Financial Resource Strain: Not on file  Food Insecurity: No Food Insecurity (12/28/2022)   Hunger Vital Sign    Worried About Running Out of Food in the Last Year: Never true    Ran Out of Food in the Last Year: Never true  Transportation Needs: No Transportation Needs (12/28/2022)   PRAPARE - Hydrologist (Medical): No    Lack of Transportation (Non-Medical): No  Physical Activity: Not on file  Stress: Not on file  Social Connections: Not on file  Intimate Partner Violence: Not At Risk (12/28/2022)   Humiliation, Afraid, Rape, and Kick questionnaire    Fear of Current or Ex-Partner: No    Emotionally Abused: No    Physically Abused: No    Sexually Abused: No    Review of Systems: All systems reviewed and negative except where noted in HPI.  Physical Exam: Vital signs in last 24 hours: Temp:   [98.2 F (36.8 C)-98.5 F (36.9 C)] 98.2 F (36.8 C) (01/20 0839) Pulse Rate:  [76-95] 90 (01/20 0839) Resp:  [17-22] 22 (01/20 0839) BP: (97-135)/(51-63) 98/55 (01/20 0839) SpO2:  [99 %-100 %] 100 % (01/20 0839) Last BM Date : 01/14/23  General:  Alert thin male in NAD Psych:  Pleasant, cooperative. Normal mood and affect Eyes: Pupils equal Ears:  Normal auditory acuity Nose: No deformity, discharge or lesions Neck:  Supple, no masses felt Lungs:  Clear to auscultation.  Heart:  Regular rate, regular rhythm.  Abdomen:  Soft, nondistended, nontender, active bowel sounds, no masses felt. Fresh blood aspirated from g-tube Rectal :  Deferred Msk: Symmetrical without gross deformities.  Neurologic:  Alert, oriented, grossly normal neurologically Extremities : No edema Skin:  Intact without significant lesions.    Intake/Output from previous day: 01/19 0701 - 01/20 0700 In: 3091.6 [NG/GT:3091.6] Out: 725 [Urine:725] Intake/Output this shift:  No intake/output data recorded.    Principal Problem:   Acute ischemic left middle cerebral artery (MCA) stroke [facility]) Active Problems:   Dysphagia, pharyngoesophageal phase    Tye Savoy, NP-C @  01/15/2023, 9:24 AM

## 2023-01-15 NOTE — Progress Notes (Addendum)
Speech Language Pathology Daily Session Note  Patient Details  Name: [name] MRN: [id number] Date of Birth: March 10, 1946  Today's Date: 01/15/2023 SLP Individual Time: 0730-0750 SLP Individual Time Calculation (min): 20 min  Short Term Goals: Week 2: SLP Short Term Goal 1 (Week 2): STG's = LTG's due to ELOS  Skilled Therapeutic Interventions: Pt seen this date for skilled ST intervention targeting cognitive goals outlined in care plan. Pt received awake, though visibly fatigued, lying at 32 degree angle in bed. Per chart review and pt report, pt with documented maroon colored stool this AM, and states that he feels "terrible." Reports he is afraid his esophagus may be bleeding (hx of this PTA). Politely declined therapy this AM despite gentle encouragement. Continues to exhibit intermittent, congested cough. Appearing pale.   With time and gentle encouragement, pt eventually agreeable to performing oral care via suction toothbrush given pt with emesis episode this AM (not documented in chart), and reinforced education to keep [facility] at or above 30 degrees at this time; pt verbalized understanding with HOB at 32 degrees throughout session. Pt performed oral care with set-up assistance. Following oral care, suction stopped working; therefore, SLP assisted in trouble-shooting by replaced suction lines; successful return to vacuum noted with change to lines. Pt demonstrated recall of early morning events with overall Min A and demonstrated good anticipatory awareness without cues, stating that "if this happened to me at home, my wife would have had to bring me straight to the hospital." Reinforced insight and judgement. D/w pt upcoming discharge date and how he did not feel ready to return home at this time. When asked about additional rehab at SNF, pt endorsed that he does NOT want to go to a SNF if at all possible. Pt requesting SLP notify therapy team of his current status, stating that he does  not feel that he can participate in therapy this date; OT, PT, and RN notified + MD aware. Due to pt illness, he missed 25 minutes of scheduled ST intervention.  Pt left in bed with all safety measures activated, suction within reach, call bell reviewed and within reach, and all immediate needs within reach. Continue per current ST POC.  Pain Pt reports general malaise, but no specific pain. Ill-appearing this AM.  Therapy/Group: Individual Therapy  [name] A Bowman Higbie 01/15/2023, 7:58 AM

## 2023-01-15 NOTE — Progress Notes (Signed)
Physical Therapy Session Note  Patient Details  Name: [name] MRN: [id number] Date of Birth: 10-18-1946  Today's Date: 01/15/2023 PT Missed Time: 26 Minutes Missed Time Reason: MD hold (Comment) (verbal confirmation from MD that pt is too ill to participate)  Short Term Goals: Week 1:  PT Short Term Goal 1 (Week 1): Pt will complete bed mobility with supervision without hospital bed features PT Short Term Goal 1 - Progress (Week 1): Partly met PT Short Term Goal 2 (Week 1): Pt will complete bed<>chair transfers with CGA and LRAD PT Short Term Goal 2 - Progress (Week 1): Not met PT Short Term Goal 3 (Week 1): Pt will ambulate 148f with CGA and LRAD PT Short Term Goal 3 - Progress (Week 1): Not met PT Short Term Goal 4 (Week 1): Pt will initiate stair training PT Short Term Goal 4 - Progress (Week 1): Met  Skilled Therapeutic Interventions/Progress Updates:    Discussed with MD prior to entering pt room, who stated pt is not medically stable for therapy at this time. Pt missed 30 min of scheduled therapy.     Therapy/Group: Individual Therapy  OMickel Fuchs1/20/2024, 12:51 PM

## 2023-01-15 NOTE — Progress Notes (Signed)
GI UPDATE:  EGD done at bedside.  Significant secretions in the esophagus that needed to be suctioned in order to visualize, indicating poor clearance.  At the distal esophagus there is a pinpoint hole stricture that regular endoscope could not traverse.  This was replaced with a pediatric ultraslim scope which also could not traverse the area.  Some mild mucosal trauma from attempting to traverse it but no significant bleeding.  I cannot dilate the stricture at this time given he has had Brilinta could worsen bleeding.  Very difficult situation, I cannot access his stomach for hemostasis endoscopically at this time.  We will await his course with blood transfusion and if he continues to bleed we will need to do CTA to help localize and potentially embolize if active bleeding.  Surgery would be another option but last resort if he is significantly bleeding.  I called his son [name] and discussed the situation with him who understands.  He reports the patient has had major bleeding in the past on antiplatelet therapy.  Otherwise continue IV Protonix drip and will await his course.  Formal procedure note to follow-up, 1 unit PRBC and, second unit pending.  Await posttransfusion hemoglobin.  Updated Dr. Bjorn Loser of the situation critical care  Jolly Mango, MD [facility] Gastroenterology

## 2023-01-15 NOTE — Procedures (Signed)
Central Venous Catheter Insertion Procedure Note  [name]  [id number]  11/29/1946  Date:01/15/23  Time:7:51 PM   Provider Performing:Annalea Alguire Jerilynn Mages Verlee Monte   Procedure: Insertion of Non-tunneled Central Venous [phone]) with US guidance ([id number])   Indication(s) Medication administration  Consent Risks of the procedure as well as the alternatives and risks of each were explained to the patient and/or caregiver.  Consent for the procedure was obtained and is signed in the bedside chart  Anesthesia Topical only with 1% lidocaine   Timeout Verified patient identification, verified procedure, site/side was marked, verified correct patient position, special equipment/implants available, medications/allergies/relevant history reviewed, required imaging and test results available.  Sterile Technique Maximal sterile technique including full sterile barrier drape, hand hygiene, sterile gown, sterile gloves, mask, hair covering, sterile ultrasound probe cover (if used).  Procedure Description Area of catheter insertion was cleaned with chlorhexidine and draped in sterile fashion.  With real-time ultrasound guidance a central venous catheter was placed into the right femoral vein. Nonpulsatile blood flow and easy flushing noted in all ports.  The catheter was sutured in place and sterile dressing applied.    Complications/Tolerance None; patient tolerated the procedure well. Chest X-ray is ordered to verify placement for internal jugular or subclavian cannulation.   Chest x-ray is not ordered for femoral cannulation.  EBL Minimal  Specimen(s) None

## 2023-01-15 NOTE — Procedures (Signed)
Intubation Procedure Note  HICKS FEICK  [id number]  10-11-1946  Date:01/15/23  Time:2:26 PM   Provider Performing:Venisha Boehning Jerilynn Mages Verlee Monte    Procedure: Intubation (31500)  Indication(s) Respiratory Failure  Consent Risks of the procedure as well as the alternatives and risks of each were explained to the patient and/or caregiver.  Consent for the procedure was obtained and is signed in the bedside chart   Anesthesia Versed, ketamine, rocuronium   Time Out Verified patient identification, verified procedure, site/side was marked, verified correct patient position, special equipment/implants available, medications/allergies/relevant history reviewed, required imaging and test results available.   Sterile Technique Usual hand hygeine, masks, and gloves were used   Procedure Description Patient positioned in bed supine.  Sedation given as noted above.  Patient was intubated with endotracheal tube using Glidescope s4.  View was Grade 2 only posterior commissure .  Number of attempts was 1.  Colorimetric CO2 detector was consistent with tracheal placement.   Complications/Tolerance None; patient tolerated the procedure well. Chest X-ray is ordered to verify placement.   EBL Minimal   Specimen(s) None

## 2023-01-15 NOTE — H&P (Signed)
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 On 1/20 pt vomited x2, which may have had some bloody flecks present. This was followed by reportedly moderate-large  volume melena + dark bloody output from his PEG. Hgb checked and was 6.2. GI was consulted and sounds like has plans for EGD. ASA and Brillinta on hold   PCCM consulted for admission in this setting   Complex medical history, looks like he receives a lot of outpt care through [facility]. Notable to current GIB, looks like he had a EGD 8/24 and there were some non-bleeding duodenal ulcers and esophagitis  Looks like he had some orthostatic hypotension on 1/18 working w PT and req incr midodrine at that time   Pertinent  Medical History  CVA DVT GIB Esophagitis Gastritis Duodenal ulcer Oropharyngeal cancer Achalasia RA Chronic steroid use chronic AC / antiplt use  Orthostatic hypotension   Significant Hospital Events: Including procedures, antibiotic start and stop dates in addition to other pertinent events   1/20 Admitting to ICU from CIR for GIB   Interim History / Subjective:  Hgb has resulted at 6.2 GI has since communicated to PMR plans for EGD   Rapid at bedside  Placed on O2 for subjective SOB. Not hypoxic   Objective   There were no vitals taken for this visit.       No intake or output data in the 24 hours ending 01/15/23 1045 There were no vitals filed for this visit.  Examination: General:  Chronically ill elderly M NAD HENT: NCAT pink mm anicteric sclera Lungs: incr RR, even unlabored.  Cardiovascular: rrr cap refill < 3 sec  Abdomen: soft. Generalized tenderness. G tube with dark bloodied output  Extremities: no acute joint deformity. RUE weakness  Neuro: Lethargic, oriented x3 following commands GU: defer   Resolved Hospital Problem list     Assessment & Plan:   Acute GIB ABLA due to GIB  Hx prior GIB, esophagitis, gastritis, duodenal ulcer P -admit to ICU -GI following and w plans for EGD (expect will want intubated for this). If stable after, maybe T to SDU  -send type and screen -awaiting coags -1 PRBC (hopefully after T&S result)  -PPI  -NPO  -holding antiplt, AC    L MCA CVA L ICA stenosis -s/p very recent ICA stent 1/8, on ASA brilinta P -holding acutely in setting of acute GIB -supportive care -anticipate will end up back to CIR when acute issues resolved   Achalasia  Esophageal outlet obstruction  Hx unprovoked PE/DVT (2022) RA  Orthostatic hypotension  P -PEG dependent. Holding anything enteral for now w GIB -will need rheum med rec -- with acute GIB will hold chronic pred for the moment  -midodrine      Best Practice (right click and "Reselect all SmartList Selections" daily)   Diet/type: NPO DVT prophylaxis: SCD GI prophylaxis: PPI Lines: N/A Foley:  N/A Code Status:  full code Last date of multidisciplinary goals of care discussion [1/20 - PCCM, RN,  PMR]  Labs   CBC: Recent Labs  Lab 01/15/23 0945  WBC 10.6*  HGB 6.2*  HCT 18.4*  MCV 85.6  PLT 734    Basic Metabolic Panel: Recent Labs  Lab 01/10/23 0635 01/13/23 0547 01/15/23 0945  NA 134* 136 136  K 4.2 3.8 4.6  CL 102 101 102  CO2 '23 25 23  '$ GLUCOSE 173* 186* 154*  BUN 28* 28* 55*  CREATININE 0.81 0.88 0.99  CALCIUM 8.4* 8.5* 8.2*  MG  --  1.7  --    GFR: Estimated Creatinine Clearance: 63.7 mL/min (by C-G formula based on SCr of 0.99 mg/dL). Recent  Labs  Lab 01/15/23 0945  WBC 10.6*    Liver Function Tests: No results for input(s): "AST", "ALT", "ALKPHOS", "BILITOT", "PROT", "ALBUMIN" in the last 168 hours. No results for input(s): "LIPASE", "AMYLASE" in the last 168 hours. No results for input(s): "AMMONIA" in the last 168 hours.  ABG    Component Value Date/Time   PHART 7.476 (H) 08/14/2012 2141   PCO2ART 25.6 (L) 08/14/2012 2141   PO2ART 101.0 (H) 08/14/2012 2141   HCO3 18.7 (L) 08/14/2012 2141   TCO2 23 12/27/2022 0942   ACIDBASEDEF 4.3 (H) 08/14/2012 2141   O2SAT 97.5 08/14/2012 2141     Coagulation Profile: Recent Labs  Lab 01/15/23 0945  INR 1.2    Cardiac Enzymes: No results for input(s): "CKTOTAL", "CKMB", "CKMBINDEX", "TROPONINI" in the last 168 hours.  HbA1C: Hemoglobin A1C  Date/Time Value Ref Range Status  11/20/2014 11:40 AM 7.5 (H) 4.2 - 6.3 % Final    Comment:    The American Diabetes Association recommends that a primary goal of therapy should be <7% and that physicians should reevaluate the treatment regimen in patients with HbA1c values consistently >8%.    Hgb A1c MFr Bld  Date/Time Value Ref Range Status  12/27/2022 08:28 PM 6.2 (H) 4.8 - 5.6 % Final    Comment:    (NOTE)         Prediabetes: 5.7 - 6.4         Diabetes: >6.4         Glycemic control for adults with diabetes: <7.0   08/20/2022 07:09 AM 6.8 (H) 4.8 - 5.6 % Final    Comment:    (NOTE) Pre diabetes:          5.7%-6.4%  Diabetes:              >6.4%  Glycemic control for   <7.0% adults with diabetes     CBG: Recent Labs  Lab 01/14/23 1652 01/14/23 2106 01/15/23 0029 01/15/23 0432 01/15/23 0808  GLUCAP 130* 163* 173* 192* 139*    Review of Systems:   Review of Systems  Constitutional: Negative.   HENT: Negative.    Respiratory:  Positive for shortness of breath.   Cardiovascular: Negative.   Gastrointestinal:  Positive for melena, nausea and vomiting.  Genitourinary: Negative.   Musculoskeletal:  Negative.   Skin: Negative.   Neurological:  Positive for headaches.  Endo/Heme/Allergies: Negative.      Past Medical History:  He,  has a past medical history of Aortic aneurysm ([place]) (05/2017), Chronic back pain, Diabetes mellitus without complication ([place]), Dyslipidemia, GERD (gastroesophageal reflux disease), H/O hiatal hernia, Hypertension, Melanoma ([place]), PONV (postoperative nausea and vomiting), Rheumatoid arthritis ([place]), and Spasm of esophagus.   Surgical History:   Past Surgical History:  Procedure Laterality Date   aneursym repair     aneursym repair and stent  placement  05/2017   BIOPSY  08/19/2022   Procedure: BIOPSY;  Surgeon: Ladene Artist, MD;  Location: [facility] ENDOSCOPY;  Service: Gastroenterology;;   CARPAL TUNNEL RELEASE Bilateral    COLON SURGERY  2007   removed 18" of colon   ERCP W/ METAL STENT PLACEMENT  11/2005   Archie Endo 12/14/2005   ESOPHAGOGASTRODUODENOSCOPY (EGD) WITH ESOPHAGEAL DILATION     "several times'   ESOPHAGOGASTRODUODENOSCOPY (EGD) WITH PROPOFOL N/A 08/19/2022   Procedure: ESOPHAGOGASTRODUODENOSCOPY (EGD) WITH PROPOFOL;  Surgeon: Ladene Artist, MD;  Location: [place];  Service: Gastroenterology;  Laterality: N/A;   HAND SURGERY Bilateral    "plastic knuckles"   IR ANGIO INTRA EXTRACRAN SEL COM CAROTID INNOMINATE UNI R MOD SED  01/03/2023   IR ANGIO VERTEBRAL SEL SUBCLAVIAN INNOMINATE UNI L MOD SED  01/03/2023   IR ANGIO VERTEBRAL SEL VERTEBRAL UNI R MOD SED  01/03/2023   IR INTRAVSC STENT CERV CAROTID W/EMB-PROT MOD SED INCL ANGIO  01/03/2023   IR US GUIDE VASC ACCESS RIGHT  01/03/2023   LAPAROSCOPIC CHOLECYSTECTOMY  01/2006   MELANOMA EXCISION     "back; left arm"   NASAL SEPTUM SURGERY     PELVIC FRACTURE SURGERY  1964   "busted in 2 places"   RADIOLOGY WITH ANESTHESIA N/A 01/03/2023   Procedure: IR WITH ANESTHESIA;  Surgeon: Radiologist, Medication, MD;  Location: [place];  Service: Radiology;  Laterality: N/A;   RESECTION DISTAL CLAVICAL  Right 05/13/2016   Procedure: DISTAL CLAVICLE EXCISION;  Surgeon: Ninetta Lights, MD;  Location: [place];  Service: Orthopedics;  Laterality: Right;   SHOULDER ARTHROSCOPY WITH ROTATOR CUFF REPAIR AND SUBACROMIAL DECOMPRESSION Right 05/13/2016   Procedure: RIGHT SHOULDER SCOPE DEBRIDEMENT, ACROMIOPLASTY, ROTATOR CUFF REPAIR; RELEASE BICEPS TENDON AND DEBRIDEMENT LABRUM;  Surgeon: Ninetta Lights, MD;  Location: [place];  Service: Orthopedics;  Laterality: Right;   TIBIA FRACTURE SURGERY Left 2006   "put a steel rod in it"     Social History:   reports that he quit smoking about 17 years ago. His smoking use included cigarettes. He has a 46.00 pack-year smoking history. He has never used smokeless tobacco. He reports current alcohol use of about 9.0 standard drinks of alcohol per week. He reports that he does not use drugs.   Family History:  His family history includes Cancer in his maternal grandmother; Heart disease in his father and mother.   Allergies No Known Allergies   Home Medications  Prior to Admission medications   Medication Sig Start Date End Date Taking? Authorizing Provider  abatacept (ORENCIA) 250 MG injection Inject 250 mg into the vein every 30 (thirty) days. Patient taking differently: Inject 750 mg into the vein every 30 (thirty) days. 02/12/21   Sanjuan Dame, MD  alum & mag hydroxide-simeth (MAALOX/MYLANTA) 200-200-20 MG/5ML suspension Take 30 mLs by mouth 3 (three) times daily as needed for indigestion or heartburn. Patient not taking: Reported on 12/27/2022 08/27/22   Rick Duff, MD  aspirin 81 MG chewable tablet Place 1 tablet (81 mg total) into feeding tube daily. 01/06/23   Mapp, Claudia Desanctis, MD  atorvastatin (LIPITOR) 40 MG tablet Take 1 tablet (40 mg total) by mouth daily. 08/27/22   Leigh Aurora, DO  diclofenac Sodium (VOLTAREN) 1 % GEL Apply 2 g topically 4 (four) times daily. Patient not taking: Reported on 12/28/2022 08/27/22    Rick Duff, MD  famotidine (PEPCID) 20 MG tablet Take 1 tablet (20 mg total) by mouth daily. 08/27/22  08/27/23  Leigh Aurora, DO  Feeding Supplies KIT 1 Box by Does not apply route daily. Patient will need pump and supplies for continuous tube feeds and 88m syringes for free water flushes 12/31/22   Mapp, TClaudia Desanctis MD  ferrous sulfate 300 (60 Fe) MG/5ML syrup Take 5 mLs (300 mg total) by mouth daily. 08/27/22   PLeigh Aurora DO  folic acid (FOLVITE) 1 MG tablet Take 1 tablet (1 mg total) by mouth daily. 08/27/22   PLeigh Aurora DO  levothyroxine (SYNTHROID) 50 MCG tablet Take 1 tablet (50 mcg total) by mouth daily. 08/27/22   PLeigh Aurora DO  lidocaine (XYLOCAINE) 2 % solution Use as directed 15 mLs in the mouth or throat 3 (three) times daily as needed for mouth pain (Indigestion). Patient not taking: Reported on 12/28/2022 08/27/22   CRick Duff MD  magnesium oxide (MAG-OX) 400 (240 Mg) MG tablet Take 1 tablet (400 mg total) by mouth daily. 08/27/22   PLeigh Aurora DO  metFORMIN (GLUCOPHAGE) 500 MG tablet Take 1 tablet (500 mg total) by mouth daily. 08/27/22   PLeigh Aurora DO  Methotrexate 25 MG/ML SOSY Inject 15 mg into the skin once a week.    [provider]  Nutritional Supplements (FEEDING SUPPLEMENT, OSMOLITE 1.5 CAL,) LIQD Place 1,000 mLs into feeding tube continuous. For 18 hours daily. 12/31/22   Mapp, TClaudia Desanctis MD  Nutritional Supplements (FEEDING SUPPLEMENT, OSMOLITE 1.5 CAL,) LIQD Place 1,000 mLs into feeding tube daily. 01/06/23   Mapp, TClaudia Desanctis MD  ondansetron (ZOFRAN) 4 MG tablet Take 1 tablet (4 mg total) by mouth every 12 (twelve) hours as needed for nausea or vomiting. 08/27/22   PLeigh Aurora DO  oxyCODONE (OXY IR/ROXICODONE) 5 MG immediate release tablet Take 1 tablet (5 mg total) by mouth every 4 (four) hours as needed for moderate pain. 08/27/22   CRick Duff MD  pantoprazole (PROTONIX) 40 MG tablet Take 1 tablet (40 mg total) by mouth 2 (two) times daily. Via tube 08/27/22   CRick Duff MD  predniSONE (DELTASONE) 10 MG tablet Place 1 tablet (10 mg total) into feeding tube daily for 5 days, THEN 0.5 tablets (5 mg total) daily for 25 days. 01/06/23 02/05/23  Mapp, TClaudia Desanctis MD  Protein (FEEDING SUPPLEMENT, PROSOURCE TF20,) liquid Place 60 mLs into feeding tube daily. 01/07/23   Mapp, TClaudia Desanctis MD  sucralfate (CARAFATE) 1 GM/10ML suspension Take 1 g by mouth 4 (four) times daily -  before meals and at bedtime.    [provider]  tadalafil (CIALIS) 5 MG tablet Take 2 tablets (10 mg total) by mouth daily. 08/27/22   PLeigh Aurora DO  ticagrelor (BRILINTA) 90 MG TABS tablet Place 1 tablet (90 mg total) into feeding tube 2 (two) times daily. 01/06/23   Mapp, TClaudia Desanctis MD  Water For Irrigation, Sterile (FREE WATER) SOLN Place 200 mLs into feeding tube every 4 (four) hours. 12/31/22   Mapp, TClaudia Desanctis MD  Water For Irrigation, Sterile (FREE WATER) SOLN Place 150 mLs into feeding tube every 4 (four) hours. 01/06/23   Mapp,Claudia Desanctis MD     Critical care time:      CRITICAL CARE Performed by: GCristal Generous  Total critical care time: 35 minutes  Critical care time was exclusive of separately billable procedures and treating other patients. Critical care was necessary to treat or prevent imminent or life-threatening deterioration.  Critical care was time spent personally by me on the following activities: development of treatment plan with patient and/or surrogate as well  as nursing, discussions with consultants, evaluation of patient's response to treatment, examination of patient, obtaining history from patient or surrogate, ordering and performing treatments and interventions, ordering and review of laboratory studies, ordering and review of radiographic studies, pulse oximetry and re-evaluation of patient's condition.  Eliseo Gum MSN, AGACNP-BC [place] for pager 01/15/2023, 10:45 AM

## 2023-01-15 NOTE — Progress Notes (Signed)
Speech Language Pathology Note  Patient Details  Name: [name] MRN: [id number] Date of Birth: 17-Apr-1946 Today's Date: 01/15/2023  Speech Therapy Discharge Note   This patient was unable to complete the inpatient rehab program due to medical issues; therefore did not meet their long term goals. Pt left the program at a overall min A assist level for his cognitive skills. This patient is being discharged from [facility] services at this time.  Pt unable to complete post-assessment due to medical status   See CareTool for functional status details.   If the patient is able to return to inpatient rehabilitation within 3 midnights, this may be considered an interrupted stay and therapy services will resume as ordered. Modification and reinstatement of their goals will be made upon completion of therapy service reevaluations.    Naevia Unterreiner A Tarl Cephas 01/15/2023, 8:24 PM

## 2023-01-15 NOTE — Progress Notes (Signed)
This nurse in room to assess patient after being alerted to change in condition over the morning by bedside nurse patient had 2 large maroon stools overnight. blood pressure and respiratory rate flagged a yellow mews MD notified on rounds per protocol and asked about STAT labs that were ordered early this am. Labs had not been collected per chart review. Phlebotomy was called with no response and rapid response was called to assist with patient assessment. MD consulted to GI team with concern for acute GI Bleed, and GI MD at bedside to assess patient. Patient peg tube lavaged with large amounts of dark red fluid  returned on aspiration of instilled fluid. Patient placement notified for need of an ICU bed after stat labs collected by RRT RN  showed significant drop in HGB to 6.2.  type and screen collected by RRT RN Intensive care MD at bedside to assess patient and reccommended not to transfuse immediately because patient vitals were not unstable. report called to assigned ICU bed. patient transferred to ICU at 1250 hours.

## 2023-01-15 NOTE — Procedures (Signed)
Arterial Catheter Insertion Procedure Note  [name]  [id number]  July 11, 1946  Date:01/15/23  Time:3:02 PM    Provider Performing: Solon Augusta E    Procedure: Insertion of Arterial Line [phone]) without US guidance  Indication(s) Blood pressure monitoring and/or need for frequent ABGs  Consent Risks of the procedure as well as the alternatives and risks of each were explained to the patient and/or caregiver.  Consent for the procedure was obtained and is signed in the bedside chart  Anesthesia None   Time Out Verified patient identification, verified procedure, site/side was marked, verified correct patient position, special equipment/implants available, medications/allergies/relevant history reviewed, required imaging and test results available.   Sterile Technique Maximal sterile technique including full sterile barrier drape, hand hygiene, sterile gown, sterile gloves, mask, hair covering, sterile ultrasound probe cover (if used).   Procedure Description Area of catheter insertion was cleaned with chlorhexidine and draped in sterile fashion. Without real-time ultrasound guidance an arterial catheter was placed into the left radial artery.  Appropriate arterial tracings confirmed on monitor.     Complications/Tolerance None; patient tolerated the procedure well.   EBL Minimal   Specimen(s) None  RT X2 inserted Arterial line, no complications noted. Pt tolerated procedure well.

## 2023-01-15 NOTE — Significant Event (Signed)
Rapid Response Event Note   Reason for Call :  Called by Lovorn MD for hypotension and melena  Initial Focused Assessment:  Patient opens eyes to voice, A&O x4, states he does not feel well (starting yesterday). Complaints of a headache and general malaise. Skin warm and dry, lungs clear and heart tones normal. Some complaints of SOB. No complaints of abdominal pain.   Per staff, vomit x2 this AM (creamy and blood speckled) and two large maroon bowel movements.   VS 98/55 (69) HR 90 RR 22 O2 100% RA Temp 98.1 oral  Interventions:  IV placed x2 Labs drawn Placed on 2L [place] Respiratory panel GI consulted, to bedside PCCM consulted, to bedside  Plan of Care:  Transfer patient to ICU Plan to scope after blood transfusion  Event Summary:  MD Notified: Lovorn MD Call Time: [place] Time: 0930 End Time: Henry  Newman Nickels, RN

## 2023-01-16 ENCOUNTER — Encounter (HOSPITAL_COMMUNITY): Payer: Self-pay | Admitting: Gastroenterology

## 2023-01-16 DIAGNOSIS — J9601 Acute respiratory failure with hypoxia: Secondary | ICD-10-CM | POA: Diagnosis not present

## 2023-01-16 DIAGNOSIS — Z7902 Long term (current) use of antithrombotics/antiplatelets: Secondary | ICD-10-CM

## 2023-01-16 DIAGNOSIS — Z9911 Dependence on respirator [ventilator] status: Secondary | ICD-10-CM | POA: Diagnosis not present

## 2023-01-16 DIAGNOSIS — D62 Acute posthemorrhagic anemia: Secondary | ICD-10-CM | POA: Diagnosis not present

## 2023-01-16 LAB — BASIC METABOLIC PANEL
Anion gap: 10 (ref 5–15)
BUN: 60 mg/dL — ABNORMAL HIGH (ref 8–23)
CO2: 20 mmol/L — ABNORMAL LOW (ref 22–32)
Calcium: 8.3 mg/dL — ABNORMAL LOW (ref 8.9–10.3)
Chloride: 105 mmol/L (ref 98–111)
Creatinine, Ser: 1.04 mg/dL (ref 0.61–1.24)
GFR, Estimated: >60 mL/min (ref >60)
Glucose, Bld: 186 mg/dL — ABNORMAL HIGH (ref 70–99)
Potassium: 4 mmol/L (ref 3.5–5.1)
Sodium: 135 mmol/L (ref 135–145)

## 2023-01-16 LAB — MAGNESIUM: Magnesium: 2.1 mg/dL (ref 1.7–2.4)

## 2023-01-16 LAB — CBC
HCT: 23.8 % — ABNORMAL LOW (ref 39.0–52.0)
HCT: 23 % — ABNORMAL LOW (ref 39.0–52.0)
Hemoglobin: 8 g/dL — ABNORMAL LOW (ref 13.0–17.0)
Hemoglobin: 7.6 g/dL — ABNORMAL LOW (ref 13.0–17.0)
MCH: 29 pg (ref 26.0–34.0)
MCH: 29.1 pg (ref 26.0–34.0)
MCHC: 33.6 g/dL (ref 30.0–36.0)
MCHC: 33 g/dL (ref 30.0–36.0)
MCV: 86.2 fL (ref 80.0–100.0)
MCV: 88.1 fL (ref 80.0–100.0)
Platelets: 285 10*3/uL (ref 150–400)
Platelets: 259 10*3/uL (ref 150–400)
RBC: 2.76 MIL/uL — ABNORMAL LOW (ref 4.22–5.81)
RBC: 2.61 MIL/uL — ABNORMAL LOW (ref 4.22–5.81)
RDW: 16.6 % — ABNORMAL HIGH (ref 11.5–15.5)
RDW: 16.4 % — ABNORMAL HIGH (ref 11.5–15.5)
WBC: 12 10*3/uL — ABNORMAL HIGH (ref 4.0–10.5)
WBC: 13.6 10*3/uL — ABNORMAL HIGH (ref 4.0–10.5)
nRBC: 0 % (ref 0.0–0.2)
nRBC: 0 % (ref 0.0–0.2)

## 2023-01-16 LAB — PHOSPHORUS: Phosphorus: 3.8 mg/dL (ref 2.5–4.6)

## 2023-01-16 LAB — TRIGLYCERIDES: Triglycerides: 137 mg/dL (ref <150)

## 2023-01-16 LAB — GLUCOSE, CAPILLARY
Glucose-Capillary: 106 mg/dL — ABNORMAL HIGH (ref 70–99)
Glucose-Capillary: 170 mg/dL — ABNORMAL HIGH (ref 70–99)
Glucose-Capillary: 186 mg/dL — ABNORMAL HIGH (ref 70–99)
Glucose-Capillary: 200 mg/dL — ABNORMAL HIGH (ref 70–99)
Glucose-Capillary: 62 mg/dL — ABNORMAL LOW (ref 70–99)
Glucose-Capillary: 91 mg/dL (ref 70–99)

## 2023-01-16 MED ORDER — INSULIN ASPART 100 UNIT/ML IJ SOLN
1.0000 [IU] | INTRAMUSCULAR | Status: DC
  Administered 2023-01-16 (×2): 2 [IU] via SUBCUTANEOUS

## 2023-01-16 MED ORDER — DEXTROSE 50 % IV SOLN
INTRAVENOUS | Status: AC
  Filled 2023-01-16: qty 50

## 2023-01-16 NOTE — Plan of Care (Signed)
Patient transferred off unit d/t to medical issues

## 2023-01-16 NOTE — Progress Notes (Signed)
eLink Physician-Brief Progress Note Patient Name: [name] DOB: [birth year] MRN: [id number]   Date of Service  01/16/2023  HPI/Events of Note  Has not has any urinary output. Bladder scan was 550. In and out orders?  eICU Interventions  I/O ordered, scan prn.      Intervention Category Intermediate Interventions: Other:  Elmer Sow 01/16/2023, 1:28 AM

## 2023-01-16 NOTE — Procedures (Signed)
Extubation Procedure Note  Patient Details:   Name: [name] DOB: [birth year] MRN: [id number]   Airway Documentation:    Vent end date: 01/16/23 Vent end time: 1245   Evaluation  O2 sats: stable throughout Complications: No apparent complications Patient did tolerate procedure well. Bilateral Breath Sounds: Diminished, Clear   Yes  Herbie Saxon Olanda Boughner  Patient extubated per MD order. Positive cuff leak. No stridor noted. Patient has good strong cough. Vitals are stable on 3L [place]. RN at bedside. 01/16/2023, 12:45 PM

## 2023-01-16 NOTE — Progress Notes (Signed)
Progress Note   Subjective  Patient has been stable overnight. No significant bleeding. Hgb stable post transfusion, BUN remains high. He is sedated and remained on vent overnight for airway protection. PEG has flushed old blood.   Objective   Vital signs in last 24 hours: Temp:  [97.3 F (36.3 C)-98.7 F (37.1 C)] 97.7 F (36.5 C) (01/21 0747) Pulse Rate:  [67-106] 76 (01/21 1000) Resp:  [14-26] 26 (01/21 1000) BP: (94-137)/(49-78) 118/78 (01/21 0800) SpO2:  [98 %-100 %] 99 % (01/21 1000) Arterial Line BP: (91-199)/(36-60) 126/49 (01/21 1000) FiO2 (%):  [35 %-40 %] 35 % (01/21 0747) Weight:  [72.3 kg] 72.3 kg (01/21 0430) Last BM Date : 01/14/23 General:    white male in NAD, sedated on vent Abdomen:  Soft, nondistended. Dark maroon blood in PEG tube.  Intake/Output from previous day: 01/20 0701 - 01/21 0700 In: 1427.3 [I.V.:673; Blood:683.3; IV Piggyback:71] Out: 565 [Urine:565] Intake/Output this shift: Total I/O In: 235.9 [I.V.:235.9] Out: -   Lab Results: Recent Labs    01/15/23 0945 01/15/23 1832 01/16/23 0400  WBC 10.6* 11.5* 12.0*  HGB 6.2* 8.0* 8.0*  HCT 18.4* 23.9* 23.8*  PLT 291 309 285   BMET Recent Labs    01/15/23 0945 01/16/23 0400  NA 136 135  K 4.6 4.0  CL 102 105  CO2 23 20*  GLUCOSE 154* 186*  BUN 55* 60*  CREATININE 0.99 1.04  CALCIUM 8.2* 8.3*   LFT No results for input(s): "PROT", "ALBUMIN", "AST", "ALT", "ALKPHOS", "BILITOT", "BILIDIR", "IBILI" in the last 72 hours. PT/INR Recent Labs    01/15/23 0945  LABPROT 15.0  INR 1.2    Studies/Results: DG CHEST PORT 1 VIEW  Result Date: 01/15/2023 CLINICAL DATA:  Endotracheal tube EXAM: PORTABLE CHEST 1 VIEW COMPARISON:  01/06/2023 FINDINGS: Interval placement of endotracheal tube, tip positioned below the thoracic inlet. The heart size and mediastinal contours are within normal limits. Both lungs are clear. Chronic fracture deformity of the mid right clavicle. IMPRESSION:  1. Interval placement of endotracheal tube, tip positioned below the thoracic inlet. 2. No acute abnormality of the lungs in AP portable projection. Electronically Signed   By: Delanna Ahmadi M.D.   On: 01/15/2023 15:21       Assessment / Plan:    77 y/o male with multiple medical problems including a history of recent stroke on Brilinta, history of esophageal stricture with dilations, esophagitis, duodenal ulcers, DVT PE, PEG in place, on rehab ward, who developed an upper GI bleed yesterday:  Upper GI bleed Acute post hemorrhagic anemia Antiplatelet use Esophageal stricture / obstruction  Urgent EGD done at bedside yesterday after he was intubated. Unfortunately he has developed a severe esophageal stricture from prior history of severe esophagitis. His esophagus is nearly completely occluded (pinpoint lumen), could not traverse with regular or pediatric ultraslim scope. Dilation not done given recent Brilinta use. Brilinta has been held, on IV protonix drip, and bleeding has stabilized fortunately with supportive care. Hgb stable from last night through this AM. He likely has had an ulcer bleed from his stomach or small bowel given his history of these in the past. He is stable this AM.  The only way to gain access to his stomach given esophageal obstruction would be placing ultraslim scope through the PEG tract. This would be challenging and limited options to treat endoscopically using this modality. If he has significant bleeding would recommend CTA and if positive then go for embolization.  Fortunately he has improved. I think okay to extubate today (risk for aspiration should be low given near esophageal obstruction).   I would prefer to give more time for Brilinta to wash out prior to trying an endoscopy through the PEG tract, if that is even considered, should he rebleed, to give [place] the best chance to achieve hemostasis. Given he is stable at this time will monitor off Brilinta (currently  day 2) and see how he does. Continue IV PPI drip, monitor Hgb. Dr. Silverio Decamp to assume GI service tomorrow. Discussed with Dr. Carlis Abbott of ICU, she is okay to extubate.  PLAN: - extubate today - monitor Hgb / monitor for recurrent bleeding - continue IV protonix drip - hold Brilinta - consider endoscopy through PEG tract at some point pending his course if needed however unclear how well we could visualize using peds ultraslim scope in the setting of significant clot burden,etc. Hopefully he may resolve this on its own with more time off Brilinta. If significant bleeding in the interim contact us, would recommend CTA  Call with questions, Dr. Silverio Decamp to assume GI care tomorrow.  Jolly Mango, MD [facility]

## 2023-01-16 NOTE — Progress Notes (Signed)
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 On 1/20 pt vomited x2, which may have had some bloody flecks present. This was followed by reportedly moderate-large  volume melena + dark bloody output from his PEG. Hgb checked and was 6.2. GI was consulted and sounds like has plans for EGD. ASA and Brillinta on hold   PCCM consulted for admission in this setting   Complex medical history, looks like he receives a lot of outpt care through [facility]. Notable to current GIB, looks like he had a EGD 8/24 and there were some non-bleeding duodenal ulcers and esophagitis  Looks like he had some orthostatic hypotension on 1/18 working w PT and req incr midodrine at that time   Pertinent  Medical History  CVA DVT GIB Esophagitis Gastritis Duodenal ulcer Oropharyngeal cancer Achalasia RA Chronic steroid use chronic AC / antiplt use  Orthostatic hypotension   Significant Hospital Events: Including procedures, antibiotic start and stop dates in addition to other pertinent events   1/20 Admitting to ICU from CIR for GIB. Unable to get EGD due to esophageal stricture. 1/21 foley placed for urinary retention  Interim History / Subjective:  I/o cath overnight for urinary retention.  Objective   Blood pressure 118/78, pulse 77, temperature 97.7 F (36.5 C), temperature source Axillary, resp. rate (!) 26, weight 72.3 kg, SpO2 100 %.    Vent Mode: PSV;CPAP FiO2 (%):  [35 %-40 %] 35 % Set  Rate:  [16 bmp] 16 bmp Vt Set:  [650 mL] 650 mL PEEP:  [5 cmH20] 5 cmH20 Pressure Support:  [8 cmH20] 8 cmH20 Plateau Pressure:  [16 cmH20] 16 cmH20   Intake/Output Summary (Last 24 hours) at 01/16/2023 1219 Last data filed at 01/16/2023 1000 Gross per 24 hour  Intake 1663.25 ml  Output 565 ml  Net 1098.25 ml   Filed Weights   01/16/23 0430  Weight: 72.3 kg    Examination: General: Chronically ill-appearing man lying in bed intubated, sedated HENT: [place]/AT, eyes anicteric Lungs: Breathing comfortably on 8/5, CTAB Cardiovascular: S1-S2, regular rate and rhythm Abdomen: Soft, some dried blood around PEG tube.  Coffee ground thin fluid and PEG tube tubing Extremities: No peripheral edema Neuro: RASS -5 GU: Foley catheter with light yellow urine  Bicarb 20 BUN 60 Creatinine 1.04 WBC 12 H/H 8.0/23.8 Platelets 85  Resolved Hospital Problem list     Assessment & Plan:   Acute UGIB-concern for gastric source of bleeding ABLA due to GIB  Hx prior GIB, esophagitis, gastritis, duodenal ulcer -Continue PPI - Continue holding anticoagulation and antiplatelet medication - Vent PEG tube to gravity - Continue n.p.o. status - Serial CBCs, transfuse for hemoglobin less than 7 or hemodynamically significant bleeding -Appreciate GIs management -if he rebleeds, he will need CTA and embolization due to limited options to correct bleeding with small scope options per GI  L MCA CVA L ICA stenosis-s/p very recent ICA stent 1/8, on ASA brilinta -hold AP meds while still bleeding, but hopefully can resume soon; high risk  of in-stent stenosis -hold AP meds -con't supportive care -anticipate will end up back to CIR when acute issues resolved  -PEG dependent for feeding  Achalasia  Esophageal outlet obstruction  -per GI; can't intervene while on brilinta  Hx unprovoked PE/DVT (2022) -can resume eliquis if bleeding controlled, but AP meds are currently the most critical to keep due to  new stent  RA  -MTX held; not sure when this was last given  Orthostatic hypotension ; not sure if this pre-dates GIB or is due to volume depletion -con't midodrine  Hyperglycemia -SSI PRN  Urinary retention -foley -cannot use tamsulosin; can consider other meds for retention if persistent off sedation   Best Practice (right click and "Reselect all SmartList Selections" daily)   Diet/type: NPO DVT prophylaxis: SCD GI prophylaxis: PPI Lines: N/A Foley:  Yes, and it is still needed-placed for retention Code Status:  full code Last date of multidisciplinary goals of care discussion [1/20 - PCCM, RN, PMR]  Labs   CBC: Recent Labs  Lab 01/15/23 0945 01/15/23 1832 01/16/23 0400  WBC 10.6* 11.5* 12.0*  HGB 6.2* 8.0* 8.0*  HCT 18.4* 23.9* 23.8*  MCV 85.6 86.6 86.2  PLT 291 309 285     Basic Metabolic Panel: Recent Labs  Lab 01/10/23 0635 01/13/23 0547 01/15/23 0945 01/16/23 0400  NA 134* 136 136 135  K 4.2 3.8 4.6 4.0  CL 102 101 102 105  CO2 '23 25 23 '$ 20*  GLUCOSE 173* 186* 154* 186*  BUN 28* 28* 55* 60*  CREATININE 0.81 0.88 0.99 1.04  CALCIUM 8.4* 8.5* 8.2* 8.3*  MG  --  1.7  --  2.1  PHOS  --   --   --  3.8    GFR: Estimated Creatinine Clearance: 60.8 mL/min (by C-G formula based on SCr of 1.04 mg/dL). Recent Labs  Lab 01/15/23 0945 01/15/23 1832 01/16/23 0400  WBC 10.6* 11.5* 12.0*      Critical care time:     This patient is critically ill with multiple organ system failure which requires frequent high complexity decision making, assessment, support, evaluation, and titration of therapies. This was completed through the application of advanced monitoring technologies and extensive interpretation of multiple databases. During this encounter critical care time was devoted to patient care services described in this note for 35 minutes.  Julian Hy, DO 01/16/23 12:46 PM [facility]  For contact information, see Amion.  If no response to pager, please call PCCM consult pager. After hours, 7PM- 7AM, please call Elink.

## 2023-01-16 NOTE — Progress Notes (Signed)
Doing well on RA post extubation. Awake, feels foggy. PEG to gravity, minimal output.  Son updated at bedside.  Julian Hy, DO 01/16/23 2:59 PM [facility]  For contact information, see Amion. If no response to pager, please call PCCM consult pager. After hours, 7PM- 7AM, please call Elink.

## 2023-01-17 DIAGNOSIS — Z7901 Long term (current) use of anticoagulants: Secondary | ICD-10-CM

## 2023-01-17 DIAGNOSIS — R5381 Other malaise: Secondary | ICD-10-CM

## 2023-01-17 DIAGNOSIS — K922 Gastrointestinal hemorrhage, unspecified: Secondary | ICD-10-CM | POA: Diagnosis not present

## 2023-01-17 DIAGNOSIS — K222 Esophageal obstruction: Secondary | ICD-10-CM | POA: Diagnosis not present

## 2023-01-17 DIAGNOSIS — K921 Melena: Secondary | ICD-10-CM

## 2023-01-17 DIAGNOSIS — D62 Acute posthemorrhagic anemia: Secondary | ICD-10-CM | POA: Diagnosis not present

## 2023-01-17 DIAGNOSIS — Z7189 Other specified counseling: Secondary | ICD-10-CM

## 2023-01-17 DIAGNOSIS — Z931 Gastrostomy status: Secondary | ICD-10-CM

## 2023-01-17 LAB — CBC
HCT: 21 % — ABNORMAL LOW (ref 39.0–52.0)
HCT: 21.1 % — ABNORMAL LOW (ref 39.0–52.0)
Hemoglobin: 7 g/dL — ABNORMAL LOW (ref 13.0–17.0)
Hemoglobin: 7.1 g/dL — ABNORMAL LOW (ref 13.0–17.0)
MCH: 29.3 pg (ref 26.0–34.0)
MCH: 30 pg (ref 26.0–34.0)
MCHC: 33.3 g/dL (ref 30.0–36.0)
MCHC: 33.6 g/dL (ref 30.0–36.0)
MCV: 87.9 fL (ref 80.0–100.0)
MCV: 89 fL (ref 80.0–100.0)
Platelets: 230 10*3/uL (ref 150–400)
Platelets: 211 10*3/uL (ref 150–400)
RBC: 2.39 MIL/uL — ABNORMAL LOW (ref 4.22–5.81)
RBC: 2.37 MIL/uL — ABNORMAL LOW (ref 4.22–5.81)
RDW: 16.4 % — ABNORMAL HIGH (ref 11.5–15.5)
RDW: 16.6 % — ABNORMAL HIGH (ref 11.5–15.5)
WBC: 13.4 10*3/uL — ABNORMAL HIGH (ref 4.0–10.5)
WBC: 11.4 10*3/uL — ABNORMAL HIGH (ref 4.0–10.5)
nRBC: 0 % (ref 0.0–0.2)
nRBC: 0 % (ref 0.0–0.2)

## 2023-01-17 LAB — GLUCOSE, CAPILLARY
Glucose-Capillary: 100 mg/dL — ABNORMAL HIGH (ref 70–99)
Glucose-Capillary: 121 mg/dL — ABNORMAL HIGH (ref 70–99)
Glucose-Capillary: 131 mg/dL — ABNORMAL HIGH (ref 70–99)
Glucose-Capillary: 133 mg/dL — ABNORMAL HIGH (ref 70–99)
Glucose-Capillary: 134 mg/dL — ABNORMAL HIGH (ref 70–99)
Glucose-Capillary: 98 mg/dL (ref 70–99)

## 2023-01-17 LAB — BASIC METABOLIC PANEL
Anion gap: 10 (ref 5–15)
BUN: 38 mg/dL — ABNORMAL HIGH (ref 8–23)
CO2: 22 mmol/L (ref 22–32)
Calcium: 8.2 mg/dL — ABNORMAL LOW (ref 8.9–10.3)
Chloride: 108 mmol/L (ref 98–111)
Creatinine, Ser: 1.09 mg/dL (ref 0.61–1.24)
GFR, Estimated: >60 mL/min (ref >60)
Glucose, Bld: 124 mg/dL — ABNORMAL HIGH (ref 70–99)
Potassium: 3.7 mmol/L (ref 3.5–5.1)
Sodium: 140 mmol/L (ref 135–145)

## 2023-01-17 MED ORDER — VITAL HIGH PROTEIN PO LIQD
1000.0000 mL | ORAL | Status: DC
  Administered 2023-01-17: 1000 mL
  Filled 2023-01-17: qty 1000

## 2023-01-17 MED ORDER — SCOPOLAMINE 1 MG/3DAYS TD PT72
1.0000 | MEDICATED_PATCH | TRANSDERMAL | Status: DC
  Administered 2023-01-23 – 2023-01-26 (×2): 1.5 mg via TRANSDERMAL
  Filled 2023-01-17 (×3): qty 1

## 2023-01-17 MED ORDER — PROSOURCE TF20 ENFIT COMPATIBL EN LIQD
60.0000 mL | Freq: Every day | ENTERAL | Status: DC
  Administered 2023-01-17 – 2023-01-18 (×2): 60 mL
  Filled 2023-01-17 (×2): qty 60

## 2023-01-17 MED ORDER — ORAL CARE MOUTH RINSE: 15.0000 mL | OROMUCOSAL | Status: DC | PRN

## 2023-01-17 NOTE — Progress Notes (Signed)
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 On 1/20 pt vomited x2, which may have had some bloody flecks present. This was followed by reportedly moderate-large  volume melena + dark bloody output from his PEG. Hgb checked and was 6.2. GI was consulted and sounds like has plans for EGD. ASA and Brillinta on hold   PCCM consulted for admission in this setting   Complex medical history, looks like he receives a lot of outpt care through [facility]. Notable to current GIB, looks like he had a EGD 8/24 and there were some non-bleeding duodenal ulcers and esophagitis  Looks like he had some orthostatic hypotension on 1/18 working w PT and req incr midodrine at that time   Pertinent  Medical History  CVA DVT GIB Esophagitis Gastritis Duodenal ulcer Oropharyngeal cancer Achalasia RA Chronic steroid use chronic AC / antiplt use  Orthostatic hypotension   Significant Hospital Events: Including procedures, antibiotic start and stop dates in addition to other pertinent events   1/20 Admitting to ICU from CIR for GIB. Unable to get EGD due to esophageal stricture. 1/21 foley placed for urinary retention. Extubated   Interim History / Subjective:  Remains off vasopressors. No further bleeding noted. Hemoglobin stable.   Objective   Blood pressure (!) 99/42, pulse 81, temperature 98.5 F (36.9 C), temperature source Oral, resp. rate (!) 24, weight 73.4 kg, SpO2 95 %.    Vent Mode:  PSV;CPAP FiO2 (%):  [35 %] 35 % PEEP:  [5 cmH20] 5 cmH20 Pressure Support:  [8 cmH20] 8 cmH20   Intake/Output Summary (Last 24 hours) at 01/17/2023 0841 Last data filed at 01/17/2023 0500 Gross per 24 hour  Intake 540.52 ml  Output 2350 ml  Net -1809.48 ml   Filed Weights   01/16/23 0430 01/17/23 0500  Weight: 72.3 kg 73.4 kg    Examination: General: Chronically ill-appearing man lying in bed, no distress noted  HENT: [place]/AT, eyes anicteric Lungs: no accessory muscle use, on room air  Cardiovascular: S1-S2, regular rate and rhythm Abdomen: PEG tube clean. Dry.  Extremities: No peripheral edema Neuro: alert, oriented, follows commands  GU: intact   Resolved Hospital Problem list     Assessment & Plan:   Acute UGIB-concern for gastric source of bleeding ABLA due to GIB  Hx prior GIB, esophagitis, gastritis, duodenal ulcer Achalasia  Esophageal outlet obstruction  Plan -Continue PPI gtt for 72 hours then transition to PPI BID - Continue holding anticoagulation and antiplatelet medication - Continue n.p.o. status - Serial CBCs, transfuse for hemoglobin less than 7 or hemodynamically significant bleeding -Appreciate GIs management >> considering endoscopy via PEG. Will reach out today to see plan  -if he rebleeds, he will need CTA and embolization due to limited options to correct bleeding with small scope options per GI  L MCA CVA L ICA stenosis-s/p very recent ICA stent 1/8, on ASA brilinta Plan -hold AP meds while still bleeding, but hopefully can resume soon; high risk of in-stent stenosis. Will reach out to  neurology for recommends for ongoing anti-plt plan  -hold AP meds -con't supportive care -anticipate will end up back to CIR when acute issues resolved  -PEG dependent for feeding  Hx unprovoked PE/DVT (2022) -can resume eliquis if bleeding controlled, but AP meds are currently the most critical to keep due to new stent  RA  -MTX held; not sure when this was  last given  Orthostatic hypotension ; not sure if this pre-dates GIB or is due to volume depletion -Remains off midodrine   Hyperglycemia -SSI PRN  Urinary retention -In and Out cath PRN  -cannot use tamsulosin; can consider other meds for retention if persistent off sedation  Best Practice (right click and "Reselect all SmartList Selections" daily)   Diet/type: NPO DVT prophylaxis: SCD GI prophylaxis: PPI Lines: Maintain Femoral Line for now. D/C art line  Foley: N/A  Code Status:  full code Last date of multidisciplinary goals of care discussion [1/20 - PCCM, RN, PMR]  Labs   CBC: Recent Labs  Lab 01/15/23 0945 01/15/23 1832 01/16/23 0400 01/16/23 1348 01/17/23 0547  WBC 10.6* 11.5* 12.0* 13.6* 13.4*  HGB 6.2* 8.0* 8.0* 7.6* 7.0*  HCT 18.4* 23.9* 23.8* 23.0* 21.0*  MCV 85.6 86.6 86.2 88.1 87.9  PLT 291 309 285 259 518    Basic Metabolic Panel: Recent Labs  Lab 01/13/23 0547 01/15/23 0945 01/16/23 0400 01/17/23 0547  NA 136 136 135 140  K 3.8 4.6 4.0 3.7  CL 101 102 105 108  CO2 25 23 20* 22  GLUCOSE 186* 154* 186* 124*  BUN 28* 55* 60* 38*  CREATININE 0.88 0.99 1.04 1.09  CALCIUM 8.5* 8.2* 8.3* 8.2*  MG 1.7  --  2.1  --   PHOS  --   --  3.8  --    GFR: Estimated Creatinine Clearance: 58.9 mL/min (by C-G formula based on SCr of 1.09 mg/dL). Recent Labs  Lab 01/15/23 1832 01/16/23 0400 01/16/23 1348 01/17/23 0547  WBC 11.5* 12.0* 13.6* 13.4*    Time Spent: 55 minutes   Hayden Pedro, AGACNP-BC [place] [facility]  PCCM Pgr: [phone]

## 2023-01-17 NOTE — Progress Notes (Addendum)
Daily Rounding Note  01/17/2023, 11:23 AM  LOS: 2 days   SUBJECTIVE:   Chief complaint:   Severe dysphagia.  Obstructing esophageal stricture.  GI bleed. No bloody or melenic stools overnight or today.   Denies abdominal pain.  No nausea.  OBJECTIVE:         Vital signs in last 24 hours:    Temp:  [97.4 F (36.3 C)-98.7 F (37.1 C)] 98.5 F (36.9 C) (01/22 0755) Pulse Rate:  [76-94] 76 (01/22 1000) Resp:  [14-30] 24 (01/22 1000) BP: (93-111)/(35-50) 111/50 (01/22 0800) SpO2:  [93 %-100 %] 100 % (01/22 1000) Arterial Line BP: (103-155)/(35-67) 141/42 (01/22 1000) FiO2 (%):  [35 %] 35 % (01/21 1145) Weight:  [73.4 kg] 73.4 kg (01/22 0500) Last BM Date : 01/15/23 Filed Weights   01/16/23 0430 01/17/23 0500  Weight: 72.3 kg 73.4 kg   General: Looks chronically unwell.  Alert.  Comfortable. Heart: RRR Chest: No labored breathing or cough Abdomen: PEG site benign in the mid abdomen.  No distention.  No tenderness.  Active bowel sounds Extremities: No CCE Neuro/Psych: Oriented x 3.  Asks pertinent questions but after explaining situation and possible plans with him, he came back with questions suggesting he had not really been absorbing the information laid out to him.  Intake/Output from previous day: 01/21 0701 - 01/22 0700 In: 669.4 [P.O.:180; I.V.:489.4] Out: 2350 [Urine:2350]  Intake/Output this shift: No intake/output data recorded.  Lab Results: Recent Labs    01/16/23 0400 01/16/23 1348 01/17/23 0547  WBC 12.0* 13.6* 13.4*  HGB 8.0* 7.6* 7.0*  HCT 23.8* 23.0* 21.0*  PLT 285 259 230   BMET Recent Labs    01/15/23 0945 01/16/23 0400 01/17/23 0547  NA 136 135 140  K 4.6 4.0 3.7  CL 102 105 108  CO2 23 20* 22  GLUCOSE 154* 186* 124*  BUN 55* 60* 38*  CREATININE 0.99 1.04 1.09  CALCIUM 8.2* 8.3* 8.2*   LFT No results for input(s): "PROT", "ALBUMIN", "AST", "ALT", "ALKPHOS", "BILITOT",  "BILIDIR", "IBILI" in the last 72 hours. PT/INR Recent Labs    01/15/23 0945  LABPROT 15.0  INR 1.2   Hepatitis Panel No results for input(s): "HEPBSAG", "HCVAB", "HEPAIGM", "HEPBIGM" in the last 72 hours.  Studies/Results: DG CHEST PORT 1 VIEW  Result Date: 01/15/2023 CLINICAL DATA:  Endotracheal tube EXAM: PORTABLE CHEST 1 VIEW COMPARISON:  01/06/2023 FINDINGS: Interval placement of endotracheal tube, tip positioned below the thoracic inlet. The heart size and mediastinal contours are within normal limits. Both lungs are clear. Chronic fracture deformity of the mid right clavicle. IMPRESSION: 1. Interval placement of endotracheal tube, tip positioned below the thoracic inlet. 2. No acute abnormality of the lungs in AP portable projection. Electronically Signed   By: Delanna Ahmadi M.D.   On: 01/15/2023 15:21    ASSESMENT:     GIB: Melena, scant bloody flecks in emesis.  Fresh blood lavaged from G-tube over weekend.  EGD 01/15/2023: Fluid in esophagus indicating stasis, poor esophageal clearance.  Severe, benign appearing esophageal stenosis/stricture with pinhole lumen at distal esophagus.  Neither conventional endoscope or peds endoscope able to pass this area.  MD unable to dilate stricture due to recent antiplatelet use.  Unable to assess stomach.  No source of bleeding seen in examined esophagus.  Suggested CTA/embolization, not yet pursued.  Had August 2023 EGD showing nonbleeding duodenal ulcers, 1 with a flat pigmented spot, the remaining were clean-based.  Also benign esophageal stenosis, small HH, normal stomach.  Chronic GI meds include Pepcid 20 mg daily, Protonix 40 mg bid, Carafate 1 g qid.    Blood loss anemia, Hgb to 6.2.Marland Kitchen 2 PRBC.. 8.. 7.  Chronic p.o. iron.    Severe esophageal stricture.  MD unable to pass scope beyond this.   Notes mention chronic dysphagia, multiple prior esophageal dilatations and recurrent aspiration pneumonia.    PEG placed 07/2022 at [facility] in setting of  recurrent aspiration pneumonia.    CVA.  01/03/2023 left ICA stenting.  ASA, Brilinta on hold, last dose 1/19 at 2100.      RA, chronic prednisone.  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  IR placed PEG.      Hx OP dysphagia.  Achalasia.      Hx DVT, PE.    PLAN     Continue to hold Brilinta though neurology would like to get this as well as aspirin restarted ASAP.  Do we allow restart of Brilinta, hold aspirin?  If rebleeds then hold Brilinta again for 5 days and pursue EGD with dilation of esophageal stricture and hopefully would be able to pass endoscope and assess stomach, duodenum.  Dr. Silverio Decamp will be seeing patient today.  She does not have plans to attempt endoscopy via the G-tube opening.    Azucena Freed  01/17/2023, 11:23 AM Phone [phone]   Attending physician's note   I have taken a history, reviewed the chart and examined the patient. I performed a substantive portion of this encounter, including complete performance of at least one of the key components, in conjunction with the APP. I agree with the APP's note, impression and recommendations.    Acute upper GI bleed with melena and blood through G-tube.   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 Dilation was not attempted.  PEG placement done at [facility] for chronic dysphagia and recurrent aspiration pneumonia. 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   Patient has  not had any over GI bleed in the past 24 hours  EGJ stricture possible secondary to radiation or peptic stricture, we can attempt repeat EGD with esophageal dilation on Thursday 01/20/23 after 5 days washout of Brilinta if patient and family wants to proceed with it.  Continue PPI gtt Ok to start continuous tube feeds at slow rate  GI will continue to follow along  I have spent 50 minutes of patient care (this includes precharting, chart review, review of results, face-to-face time used for counseling as well as treatment plan and follow-up. The patient was provided an opportunity to ask questions and all were answered. The patient agreed with the plan and demonstrated an understanding of the instructions.   Damaris Hippo , MD [phone]

## 2023-01-17 NOTE — Progress Notes (Signed)
At [id number] patient vitals were taken upon request from MD. Patient presented with a yellow MEWS. MD made aware and patient placed on yellow MEWS precautions. MD was also informed that patient G-tube was presented with a reddish ting in color after administering scheduled medications. Rapid called to consult and placed two 20G Ivs  at MD request. One 20G placed in each wrist. GI consulted and planned to move patient off of unit. Sanda Linger, RN

## 2023-01-17 NOTE — Discharge Summary (Signed)
Physician Discharge Summary  Patient ID: SATISH HAMMERS MRN: [id number] DOB/AGE: December 24, 1946 77 y.o.  Admit date: 01/06/2023 Discharge date: 01/17/2023  Discharge Diagnoses:  Principal Problem:   Acute ischemic left middle cerebral artery (MCA) stroke (HCC) Active Problems:   Dysphagia, pharyngoesophageal phase   Upper GI bleed   Antiplatelet or antithrombotic long-term use   Discharged Condition: {condition:18240}  Significant Diagnostic Studies:  CT WRIST RIGHT WO CONTRAST  Result Date: 01/11/2023 CLINICAL DATA:  Radial nerve palsy. EXAM: CT OF THE RIGHT WRIST WITHOUT CONTRAST TECHNIQUE: Multidetector CT imaging of the right wrist was performed according to the standard protocol. Multiplanar CT image reconstructions were also generated. RADIATION DOSE REDUCTION: This exam was performed according to the departmental dose-optimization program which includes automated exposure control, adjustment of the mA and/or kV according to patient size and/or use of iterative reconstruction technique. COMPARISON:  Radiographs dated January 07, 2023 FINDINGS: Bones/Joint/Cartilage No evidence of fracture or osteonecrosis distal radius and ulna are intact carpal bones are intact and in normal anatomical alignment. Mild degenerative changes of the first carpometacarpal joint with joint space narrowing and marginal spurring there are advanced arthritic changes of the first and second metacarpophalangeal joint with osseous erosion of the bases of the phalanges and head of the metacarpals there are degenerative changes of the third and fourth metatarsophalangeal joints. Ligaments Suboptimally assessed by CT. Muscles and Tendons Thenar and hypothenar muscles are normal in bulk. Flexor and extensor tendons are intact. Soft tissues Skin and subcutaneous soft tissues are within normal limits. No fluid collection or hematoma. No significant soft tissue swelling. IMPRESSION: 1. 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 2. Mild degenerative changes of the first carpometacarpal joint and third and fourth metatarsophalangeal joints. 3. Carpal bones are in normal alignment. Distal radius and ulna are unremarkable. No evidence of fracture or osteonecrosis. Electronically Signed   By: Keane Police D.O.   On: 01/11/2023 22:42   VAS [place] LOWER EXTREMITY VENOUS (DVT)  Result Date: 01/07/2023  Lower Venous DVT Study Patient Name:  DARRELL HAUK  Date of Exam:   01/07/2023 Medical Rec #: [id number]           Accession #:    [id number] Date of Birth: 28-Jul-1946          Patient Gender: M Patient Age:   59 years Exam Location:  [facility] Procedure:      VAS [place] LOWER EXTREMITY VENOUS (DVT) Referring Phys: Shardea Cwynar --------------------------------------------------------------------------------  Indications: Stroke.  Risk Factors: None identified. Comparison Study: No prior studies. Performing Technologist: Oliver Hum RVT  Examination Guidelines: A complete evaluation includes B-mode imaging, spectral Doppler, color Doppler, and power Doppler as needed of all accessible portions of each vessel. Bilateral testing is considered an integral part of a complete examination. Limited examinations for reoccurring indications may be performed as noted. The reflux portion of the exam is performed with the patient in reverse Trendelenburg.  +---------+---------------+---------+-----------+----------+--------------+ RIGHT    CompressibilityPhasicitySpontaneityPropertiesThrombus Aging +---------+---------------+---------+-----------+----------+--------------+ CFV      Full           Yes      Yes                                 +---------+---------------+---------+-----------+----------+--------------+ SFJ      Full                                                         +---------+---------------+---------+-----------+----------+--------------+  FV Prox  Full                                                        +---------+---------------+---------+-----------+----------+--------------+ FV Mid   Full                                                        +---------+---------------+---------+-----------+----------+--------------+ FV DistalFull                                                        +---------+---------------+---------+-----------+----------+--------------+ PFV      Full                                                        +---------+---------------+---------+-----------+----------+--------------+ POP      Full           Yes      Yes                                 +---------+---------------+---------+-----------+----------+--------------+ PTV      Full                                                        +---------+---------------+---------+-----------+----------+--------------+ PERO     Full                                                        +---------+---------------+---------+-----------+----------+--------------+   +---------+---------------+---------+-----------+----------+--------------+ LEFT     CompressibilityPhasicitySpontaneityPropertiesThrombus Aging +---------+---------------+---------+-----------+----------+--------------+ CFV      Full           Yes      Yes                                 +---------+---------------+---------+-----------+----------+--------------+ SFJ      Full                                                        +---------+---------------+---------+-----------+----------+--------------+ FV Prox  Full                                                        +---------+---------------+---------+-----------+----------+--------------+  FV Mid   Full                                                         +---------+---------------+---------+-----------+----------+--------------+ FV DistalFull                                                        +---------+---------------+---------+-----------+----------+--------------+ PFV      Full                                                        +---------+---------------+---------+-----------+----------+--------------+ POP      Full           Yes      Yes                                 +---------+---------------+---------+-----------+----------+--------------+ PTV      Full                                                        +---------+---------------+---------+-----------+----------+--------------+ PERO     Full                                                        +---------+---------------+---------+-----------+----------+--------------+     Summary: RIGHT: - There is no evidence of deep vein thrombosis in the lower extremity.  - No cystic structure found in the popliteal fossa.  LEFT: - There is no evidence of deep vein thrombosis in the lower extremity.  - No cystic structure found in the popliteal fossa.  *See table(s) above for measurements and observations. Electronically signed by Monica Martinez MD on 01/07/2023 at 2:05:48 PM.    Final    DG Wrist 2 Views Right  Result Date: 01/07/2023 CLINICAL DATA:  Pain post stroke, possible fall EXAM: RIGHT WRIST - 2 VIEW COMPARISON:  None Available. FINDINGS: Carpal rows intact. Negative for fracture. Mild DJD at the first carpometacarpal articulation with small spurs. Normal alignment and mineralization. Regional soft tissues unremarkable. IMPRESSION: No acute findings. Mild first CMC DJD. Electronically Signed   By: Lucrezia Europe M.D.   On: 01/07/2023 10:02   DG Chest 2 View  Result Date: 01/06/2023 CLINICAL DATA:  Aspiration EXAM: CHEST - 2 VIEW COMPARISON:  Chest x-ray dated January 01, 2023 FINDINGS: Cardiac and mediastinal contours are within normal limits. Elevation of the  right hemidiaphragm. Decreased left-greater-than-right basilar opacities. Evidence of pneumothorax. IMPRESSION: Decreased left-greater-than-right basilar opacities, likely resolving aspiration. Recommend follow-up chest x-ray in 3-4 weeks to ensure complete resolution. Electronically Signed   By: Yetta Glassman M.D.   On: 01/06/2023  17:51    Labs:  Basic Metabolic Panel: Recent Labs  Lab 01/13/23 0547 01/15/23 0945  NA 136 136  K 3.8 4.6  CL 101 102  CO2 25 23  GLUCOSE 186* 154*  BUN 28* 55*  CREATININE 0.88 0.99  CALCIUM 8.5* 8.2*  MG 1.7  --   PHOS  --   --     CBC:   Latest Reference Range & Units 01/07/23 06:08 01/15/23 09:45  WBC 4.0 - 10.5 K/uL 9.0 10.6 (H)  RBC 4.22 - 5.81 MIL/uL 3.16 (L) 2.15 (L)  Hemoglobin 13.0 - 17.0 g/dL 9.0 (L) 6.2 (LL)  HCT 39.0 - 52.0 % 27.8 (L) 18.4 (L)  MCV 80.0 - 100.0 fL 88.0 85.6  MCH 26.0 - 34.0 pg 28.5 28.8  MCHC 30.0 - 36.0 g/dL 32.4 33.7  RDW 11.5 - 15.5 % 17.6 (H) 17.2 (H)  Platelets 150 - 400 K/uL 232 291  nRBC 0.0 - 0.2 % 0.0 0.0    Brief HPI:   DARRY KELNHOFER is a 77 y.o. male ***   Hospital Course: PELLEGRINO KENNARD was admitted to rehab 01/06/2023 for inpatient therapies to consist of PT, ST and OT at least three hours five days a week. Past admission physiatrist, therapy team and rehab RN have worked together to provide customized collaborative inpatient rehab. He required max assist with ADL tasks and min assist with mobility. He exhibited persistent cough with ability to expectorate large amounts of phlegm but intermittent hiccups noted with decrease ability to handle secretion therefore NPO was recommended. CXR was repeated at admission showing decrease in L>R opacities likely due to resolving aspiration w/recs to repeat CXR in 3-4 weeks. He was encouraged to use flutter valve for pulmonary hygiene.   GI at [facility] was contacted for input on safety/clearance on swallow evaluation as well as diet restrictions. IV protonix  added but changed to pepcid till Nexium available. His blood pressures were monitored on TID basis and proamatine added and titrated to help manage hypotension with dizziness.  Magnesium supplement was added to help with recurrent low Mg levels. Water flushes were incresaed to 200 cc every 4 hours X 48 hours due to AKI. Repeat labs showed   Blood sugars were elevated due to tube feeds therefore CBG were checked every 4 hours with SSI prn for tighter BS control.   Modafinil was added on 01/17 and titrated up to 200 mg daily to help with      Diet: NPO and all meds per PEG tube.   Medications at discharge:  Levothyroxine 50 mcg daily Water flushes 150 cc every 4 hours Magnesium Oxide 400  mg BID Proamatine 10 mg TID Modafinil 200 mg daily  6. Nexium 40  mg daily  7. Prednisone 1 mg dail 8. Scopolamine patch 1.5 mcg every 72 hours 9. Brilinta 90 mg bid 10. Trazodone 50 mg HS prn 11. Lipitor 40  mg daily 12. ASA 81 mg daily 13. Osmolite 1.5 ml at 95 cc/hr 14. Prosource 60 mg daily 15. SSI every 4 hours       Signed: [name] 01/17/2023, 6:15 PM

## 2023-01-17 NOTE — Progress Notes (Signed)
Inpatient Rehabilitation Discharge Medication Review by a Pharmacist  A complete drug regimen review was completed for this patient to identify any potential clinically significant medication issues.  High Risk Drug Classes Is patient taking? Indication by Medication  Antipsychotic No   Anticoagulant No   Antibiotic No   Opioid No   Antiplatelet Yes Ticagrelor, ASA - held on discharge for GI bleeding  Hypoglycemics/insulin Yes Novolog - DM  Vasoactive Medication No   Chemotherapy No   Other Yes Nexium changed to pantoprazole - GI bleed Scopolamine patch - secretions Atorvastatin - HLD Levothyroxine - thyroid Prednisone - RA Mag-ox - supplement Modafinil - wakefulness  Midodrine - hypotension     Type of Medication Issue Identified Description of Issue Recommendation(s)  Drug Interaction(s) (clinically significant)     Duplicate Therapy     Allergy     No Medication Administration End Date     Incorrect Dose     Additional Drug Therapy Needed     Significant med changes from prior encounter (inform family/care partners about these prior to discharge). PTA meds: metformin, sucralfate, oxycodone   Other       Clinically significant medication issues were identified that warrant physician communication and completion of prescribed/recommended actions by midnight of the next day:  No  Name of provider notified for urgent issues identified:   Provider Method of Notification:     Pharmacist comments: There is no discharge summary and medications were not reconciled prior to inpatient readmit.   Time spent performing this drug regimen review (minutes): 20  Thank you for involving pharmacy in this patient's care.  Renold Genta, PharmD, BCPS Clinical Pharmacist

## 2023-01-17 NOTE — Evaluation (Signed)
Physical Therapy Evaluation Patient Details Name: [name] MRN: [id number] DOB: 13-Jul-1946 Today's Date: 01/17/2023  History of Present Illness  77 y.o. male admitted 1/1 after found on floor.  [id number] foci of restricted diffusion left frontal lobe and left parietal lobe.  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  Pt (+) flu A and possible aspiration PNA. 1/5 code stroke with increased Rt weakness. 1/8 carotid stent in IR. Transfer to Rehab 1/12 and transfer back to Acute 1/20 due to GIB and VDRF.  Extubated 1/21.  New PT eval ordered 1/22.  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 Pt was able to stand and pivot to chair with min assist with slight drop in BP.  Did not walk due to BP drop which did get better after sitting in chair. Nurse approved pt staying up in chair. Will follow acutely and hopeful that pt can go back to AIR once medically treated.  Pt currently with functional limitations due to the deficits listed below (see PT Problem List). Pt will benefit from skilled PT to increase their independence and safety with mobility to allow discharge to the venue listed below.          Recommendations for follow up therapy are one component of a multi-disciplinary discharge planning process, led by the attending physician.  Recommendations may be updated based on patient status, additional functional criteria and insurance authorization.  Follow Up Recommendations Acute inpatient rehab (3hours/day)      Assistance Recommended at Discharge Frequent or constant Supervision/Assistance  Patient can return home with the following  A little help with walking and/or transfers;A lot of help with bathing/dressing/bathroom;Assistance with  cooking/housework;Direct supervision/assist for medications management;Assist for transportation;Assistance with feeding    Equipment Recommendations Rolling walker (2 wheels)  Recommendations for Other Services  Rehab consult    Functional Status Assessment Patient has had a recent decline in their functional status and demonstrates the ability to make significant improvements in function in a reasonable and predictable amount of time.     Precautions / Restrictions Precautions Precautions: Fall Precaution Comments: JG tube, NPO, hypotension, right femoral IV line (nurse approved getting OOB) Restrictions Weight Bearing Restrictions: No      Mobility  Bed Mobility Overal bed mobility: Modified Independent Bed Mobility: Supine to Sit, Sit to Supine Rolling: Supervision   Supine to sit: HOB elevated, Min guard     General bed mobility comments: use of bedrails and needed min guard assist as he kept lying back to right intiially upon sitting up stating he was tired.    Transfers Overall transfer level: Needs assistance Equipment used: Rolling walker (2 wheels) Transfers: Sit to/from Stand Sit to Stand: Min assist Stand pivot transfers: Min assist         General transfer comment: minA required with initial sit<>stand from non-elevated surface, for balance and steady, mild cueing for proper sequencing.  Pt needed to use hands to reach to chair and pivot feet    Ambulation/Gait                  Stairs            Wheelchair Mobility    Modified Rankin (Stroke Patients Only)       Balance Overall balance assessment: Needs assistance Sitting-balance support:  Feet supported, No upper extremity supported Sitting balance-Leahy Scale: Good Sitting balance - Comments: min to min guard for dynamic sitting for safety as pt leaning right at times stating he wanted to lie back down                                     Pertinent Vitals/Pain  Pain Assessment Pain Assessment: No/denies pain Faces Pain Scale: No hurt    Home Living Family/patient expects to be discharged to:: Private residence Living Arrangements: Other (Comment) (ex-wife-[name]) Available Help at Discharge: Available 24 hours/day Type of Home: House Home Access: Stairs to enter Entrance Stairs-Rails:  (HAS RAIL BUT UNSURE) Entrance Stairs-Number of Steps: 2   Home Layout: One level Home Equipment: Shower seat;Hand held Chief Technology Officer (4 wheels) Additional 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; retired    Prior Function Prior Level of Function : Independent/Modified Independent             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 Comments: He reports he can complete his own Barrister's clerk Dominance   Dominant Hand: Right    Extremity/Trunk Assessment   Upper Extremity Assessment Upper Extremity Assessment: Defer to OT evaluation    Lower Extremity Assessment Lower Extremity Assessment: Generalized weakness RLE Deficits / Details: R hip has incoordination issue RLE Coordination: decreased gross motor    Cervical / Trunk Assessment Cervical / Trunk Assessment: Normal  Communication   Communication: No difficulties  Cognition Arousal/Alertness: Awake/alert Behavior During Therapy: WFL for tasks assessed/performed Overall Cognitive Status: Impaired/Different from baseline                                 General Comments: pleasant throughout        General Comments General comments (skin integrity, edema, etc.): 74 bpm, 97% RA, 111/48 initial BP.  Sitting 88/46, post transfer 103/49    Exercises General Exercises - Upper Extremity Shoulder Flexion: AROM, Left, 10 reps Shoulder ABduction: AROM, Right, 10 reps Elbow Flexion: AROM, Right, 10 reps Elbow Extension: AROM, Right, 10 reps Wrist Flexion: AROM, Right, 10 reps Wrist Extension: AROM, Right, 10 reps General Exercises - Lower Extremity Hip  Flexion/Marching: AROM, Both, 10 reps, Seated   Assessment/Plan    PT Assessment Patient needs continued PT services  PT Problem List Decreased activity tolerance;Decreased balance;Decreased coordination;Decreased mobility;Decreased knowledge of use of DME       PT Treatment Interventions DME instruction;Gait training;Stair training;Functional mobility training;Therapeutic activities;Therapeutic exercise;Balance training;Neuromuscular re-education;Patient/family education    PT Goals (Current goals can be found in the Care Plan section)  Acute Rehab PT Goals Patient Stated Goal: I want to get better PT Goal Formulation: With patient Time For Goal Achievement: 01/31/23 Potential to Achieve Goals: Good    Frequency Min 3X/week     Co-evaluation               AM-PAC PT "6 Clicks" Mobility  Outcome Measure Help needed turning from your back to your side while in a flat bed without using bedrails?: A Little Help needed moving from lying on your back to sitting on the side of a flat bed without using bedrails?: A Little Help needed moving to and from a bed to a chair (including a wheelchair)?: A Little Help needed standing up from a chair using your arms (e.g., wheelchair or bedside chair)?: A Little  Help needed to walk in hospital room?: A Lot Help needed climbing 3-5 steps with a railing? : A Lot 6 Click Score: 16    End of Session Equipment Utilized During Treatment: Gait belt Activity Tolerance: Patient limited by fatigue Patient left: with call bell/phone within reach;in chair;with chair alarm set;with nursing/sitter in room Nurse Communication: Mobility status PT Visit Diagnosis: Unsteadiness on feet (R26.81);Muscle weakness (generalized) (M62.81);Difficulty in walking, not elsewhere classified (R26.2)    Time: 0223-3612 PT Time Calculation (min) (ACUTE ONLY): 24 min   Charges:   PT Evaluation $PT Eval Moderate Complexity: 1 Mod PT Treatments $Therapeutic  Activity: 8-22 mins        [facility] M,PT Acute Rehab Services [phone]   Alvira Philips 01/17/2023, 3:52 PM

## 2023-01-17 NOTE — Progress Notes (Signed)
Physical Therapy Discharge Note  This patient was unable to complete the inpatient rehab program due to change in medical status; therefore did not meet their long term goals. Pt left the program at a min assist level for their functional mobility/ transfers. This patient is being discharged from PT services at this time.  Pt's perception of pain in the last five days was unable to answer at this time.   See CareTool for functional status details  If the patient is able to return to inpatient rehabilitation within 3 midnights, this may be considered an interrupted stay and therapy services will resume as ordered. Modification and reinstatement of their goals will be made upon completion of therapy service reevaluations.

## 2023-01-17 NOTE — Progress Notes (Signed)
Inpatient Rehabilitation Care Coordinator Discharge Note   Patient Details  Name: [name] MRN: [id number] Date of Birth: [birth year]   Discharge location: TRANSFERRED TO ACUTE DUE TO MEDICAL ISSUES  Length of Stay: 8 DAYS  Discharge activity level: MIN LEVEL  Home/community participation: HOMEBOUND  Patient response XT:GGYIRS Literacy - How often do you need to have someone help you when you read instructions, pamphlets, or other written material from your doctor or pharmacy?: Sometimes  Patient response WN:IOEVOJ Isolation - How often do you feel lonely or isolated from those around you?: Rarely  Services provided included: MD, RD, PT, OT, SLP, RN, CM, Pharmacy, SW  Financial Services:  Financial Services Utilized: Medicare    Choices offered to/list presented to:    Follow-up services arranged:  Other (Comment) (TRANSFERRED TO ICU)           Patient response to transportation need: Is the patient able to respond to transportation needs?: Yes In the past 12 months, has lack of transportation kept you from medical appointments or from getting medications?: No In the past 12 months, has lack of transportation kept you from meetings, work, or from getting things needed for daily living?: No    Comments (or additional information):TRANSFERRED TO ICU  Patient/Family verbalized understanding of follow-up arrangements:  Yes  Individual responsible for coordination of the follow-up plan: [name]-SON  Confirmed correct DME delivered: Elease Hashimoto 01/17/2023    Elease Hashimoto

## 2023-01-17 NOTE — Progress Notes (Signed)
Transition of Care Department [facility]) following patient for high risk of readmission.   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   Transition of Care Department [facility]) has reviewed patient and we will continue to monitor patient advancement through interdisciplinary progression rounds.

## 2023-01-18 DIAGNOSIS — K922 Gastrointestinal hemorrhage, unspecified: Secondary | ICD-10-CM | POA: Diagnosis not present

## 2023-01-18 LAB — BASIC METABOLIC PANEL
Anion gap: 8 (ref 5–15)
BUN: 30 mg/dL — ABNORMAL HIGH (ref 8–23)
CO2: 23 mmol/L (ref 22–32)
Calcium: 8.1 mg/dL — ABNORMAL LOW (ref 8.9–10.3)
Chloride: 109 mmol/L (ref 98–111)
Creatinine, Ser: 1.01 mg/dL (ref 0.61–1.24)
GFR, Estimated: >60 mL/min (ref >60)
Glucose, Bld: 146 mg/dL — ABNORMAL HIGH (ref 70–99)
Potassium: 3.2 mmol/L — ABNORMAL LOW (ref 3.5–5.1)
Sodium: 140 mmol/L (ref 135–145)

## 2023-01-18 LAB — CBC
HCT: 20 % — ABNORMAL LOW (ref 39.0–52.0)
Hemoglobin: 6.7 g/dL — CL (ref 13.0–17.0)
MCH: 30 pg (ref 26.0–34.0)
MCHC: 33.5 g/dL (ref 30.0–36.0)
MCV: 89.7 fL (ref 80.0–100.0)
Platelets: 210 10*3/uL (ref 150–400)
RBC: 2.23 MIL/uL — ABNORMAL LOW (ref 4.22–5.81)
RDW: 16.7 % — ABNORMAL HIGH (ref 11.5–15.5)
WBC: 9.1 10*3/uL (ref 4.0–10.5)
nRBC: 0 % (ref 0.0–0.2)

## 2023-01-18 LAB — GLUCOSE, CAPILLARY
Glucose-Capillary: 134 mg/dL — ABNORMAL HIGH (ref 70–99)
Glucose-Capillary: 167 mg/dL — ABNORMAL HIGH (ref 70–99)
Glucose-Capillary: 144 mg/dL — ABNORMAL HIGH (ref 70–99)
Glucose-Capillary: 134 mg/dL — ABNORMAL HIGH (ref 70–99)
Glucose-Capillary: 141 mg/dL — ABNORMAL HIGH (ref 70–99)
Glucose-Capillary: 147 mg/dL — ABNORMAL HIGH (ref 70–99)

## 2023-01-18 LAB — PHOSPHORUS: Phosphorus: 3.1 mg/dL (ref 2.5–4.6)

## 2023-01-18 LAB — MAGNESIUM: Magnesium: 1.7 mg/dL (ref 1.7–2.4)

## 2023-01-18 LAB — HEMOGLOBIN AND HEMATOCRIT, BLOOD
HCT: 24.8 % — ABNORMAL LOW (ref 39.0–52.0)
Hemoglobin: 8.3 g/dL — ABNORMAL LOW (ref 13.0–17.0)

## 2023-01-18 LAB — PREPARE RBC (CROSSMATCH)

## 2023-01-18 MED ORDER — POTASSIUM CHLORIDE 20 MEQ PO PACK
20.0000 meq | PACK | ORAL | Status: AC
  Administered 2023-01-18 (×2): 20 meq
  Filled 2023-01-18 (×2): qty 1

## 2023-01-18 MED ORDER — MAGNESIUM SULFATE 2 GM/50ML IV SOLN
2.0000 g | Freq: Once | INTRAVENOUS | Status: AC
  Administered 2023-01-18: 2 g via INTRAVENOUS
  Filled 2023-01-18: qty 50

## 2023-01-18 MED ORDER — OSMOLITE 1.5 CAL PO LIQD
1000.0000 mL | ORAL | Status: AC
  Administered 2023-01-18 – 2023-01-19 (×2): 1000 mL
  Filled 2023-01-18 (×4): qty 1000

## 2023-01-18 MED ORDER — SENNOSIDES-DOCUSATE SODIUM 8.6-50 MG PO TABS
1.0000 | ORAL_TABLET | Freq: Every day | ORAL | Status: DC
  Administered 2023-01-20 – 2023-01-22 (×2): 1 via ORAL
  Filled 2023-01-18 (×2): qty 1

## 2023-01-18 MED ORDER — SODIUM CHLORIDE 0.9% IV SOLUTION: Freq: Once | INTRAVENOUS | Status: AC

## 2023-01-18 MED ORDER — POTASSIUM CHLORIDE 10 MEQ/50ML IV SOLN
10.0000 meq | INTRAVENOUS | Status: AC
  Administered 2023-01-18 (×4): 10 meq via INTRAVENOUS
  Filled 2023-01-18: qty 50

## 2023-01-18 NOTE — Progress Notes (Addendum)
Daily Rounding Note  01/18/2023, 1:02 PM  LOS: 3 days   SUBJECTIVE:   Chief complaint: Severe esophageal stricture.  GI bleed.  Patient generally feels lousy.  Is able to swallow his secretions. No blood per PEG.  No bloody stools.  Brown bowel movement this morning.  OBJECTIVE:         Vital signs in last 24 hours:    Temp:  [97.7 F (36.5 C)-99.4 F (37.4 C)] 98.2 F (36.8 C) (01/23 1149) Pulse Rate:  [73-84] 82 (01/23 1200) Resp:  [13-28] 24 (01/23 1200) BP: (97-121)/(41-58) 112/49 (01/23 1200) SpO2:  [96 %-100 %] 100 % (01/23 1200) Weight:  [70.9 kg] 70.9 kg (01/23 0259) Last BM Date : 01/15/22 Filed Weights   01/17/23 0500 01/17/23 2149 01/18/23 0259  Weight: 73.4 kg 70.9 kg 70.9 kg   General: Looks tired, chronically ill.  Alert, comfortable Heart: RRR. Chest: Diminished breath sounds throughout.  Cough with a lot of audible phlegm but not able to express the phlegm. Abdomen: Soft, nontender.  PEG site on left benign appearing. Extremities: No CCE. Neuro/Psych: Alert.  Oriented x 3.  Moves all 4 limbs.  Intake/Output from previous day: 01/22 0701 - 01/23 0700 In: 699.1 [I.V.:247.1; NG/GT:452] Out: 650 [Urine:650]  Intake/Output this shift: Total I/O In: 1267.6 [I.V.:65; Blood:350; Other:360; NG/GT:240; IV Piggyback:252.6] Out: 300 [Urine:300]  Lab Results: Recent Labs    01/17/23 0547 01/17/23 2153 01/18/23 0600 01/18/23 1217  WBC 13.4* 11.4* 9.1  --   HGB 7.0* 7.1* 6.7* 8.3*  HCT 21.0* 21.1* 20.0* 24.8*  PLT 230 211 210  --    BMET Recent Labs    01/16/23 0400 01/17/23 0547 01/18/23 0213  NA 135 140 140  K 4.0 3.7 3.2*  CL 105 108 109  CO2 20* 22 23  GLUCOSE 186* 124* 146*  BUN 60* 38* 30*  CREATININE 1.04 1.09 1.01  CALCIUM 8.3* 8.2* 8.1*   LFT No results for input(s): "PROT", "ALBUMIN", "AST", "ALT", "ALKPHOS", "BILITOT", "BILIDIR", "IBILI" in the last 72 hours. PT/INR No  results for input(s): "LABPROT", "INR" in the last 72 hours. Hepatitis Panel No results for input(s): "HEPBSAG", "HCVAB", "HEPAIGM", "HEPBIGM" in the last 72 hours.  Studies/Results: No results found.  Scheduled Meds:  Chlorhexidine Gluconate Cloth  6 each Topical Daily   docusate  100 mg Per Tube BID   [START ON 01/19/2023] pantoprazole  40 mg Intravenous Q12H   polyethylene glycol  17 g Per Tube Daily   scopolamine  1 patch Transdermal Q72H   Continuous Infusions:  sodium chloride     feeding supplement (OSMOLITE 1.5 CAL)     pantoprazole 8 mg/hr (01/18/23 1200)   PRN Meds:.docusate, ondansetron (ZOFRAN) IV, mouth rinse, polyethylene glycol   ASSESMENT:     GIB: Melena, scant blood in emesis.  Fresh blood lavaged from G-tube over weekend.  EGD 01/15/2023: Fluid in esophagus indicating stasis, poor esophageal clearance.  Severe, benign appearing esophageal stenosis/stricture with pinhole lumen at distal esophagus.  Neither conventional endoscope or peds endoscope able to pass this area.  MD unable to dilate stricture due to recent antiplatelet use.  Unable to assess stomach.  No source of bleeding seen in examined esophagus.  Suggested CTA/embolization, not yet pursued.  Had August 2023 EGD showing nonbleeding duodenal ulcers, 1 with a flat pigmented spot, the remaining were clean-based.  Also benign esophageal stenosis, small HH, normal stomach.  Chronic GI meds include Pepcid 20 mg  daily, Protonix 40 mg bid, Carafate 1 g qid.  Melena has resolved.   Blood loss anemia, Hgb to 6.2.Marland Kitchen 2 PRBC.. 8.. 6.7.. 3rd PRBC.. 8.3.    Chronic p.o. iron pta.       Severe esophageal stricture.  MD unable to pass scope beyond this.   Notes mention chronic dysphagia, multiple prior esophageal dilatations and recurrent aspiration pneumonia.     [facility] at [facility] in setting of recurrent aspiration pneumonia.     CVA.  01/03/2023 left ICA stenting.  ASA, Brilinta on hold, last dose 1/19 at 2100.       RA, chronic prednisone.  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 PEG in place.       Hx OP dysphagia.  Achalasia.      Hx DVT, PE.     PLAN     Egd, Esoph dilatation planned for thurs 11/25 8 AM    72 h Protonix drip finishes this afternoon.  Starts 40 IV bid after that.      [name]  01/18/2023, 1:02 PM Phone [phone]   Attending physician's note   I have taken a history, reviewed the chart and examined the patient. I performed a substantive portion of this encounter, including complete performance of at least one of the key components, in conjunction with the APP. I agree with the APP's note, impression and recommendations.    Dr. Carlis Abbott discussed with patient's family and they opted to continue to hold Brilinta and proceed with EGD on Thursday Continue tube feeds at slow rate as tolerated Continue PPI Monitor hemoglobin and transfuse to>7   The patient was provided an opportunity to ask questions and all were answered. The patient agreed with the plan and demonstrated an understanding of the instructions.   Damaris Hippo , MD [phone]

## 2023-01-18 NOTE — Progress Notes (Signed)
Initial Nutrition Assessment  DOCUMENTATION CODES:   Severe malnutrition in context of chronic illness  INTERVENTION:  Resume prior TF regimen over 24 hours via GJ tube. Will cycle when pt is medically stable: Osmolite 1.5 at 65 ml/h (1560 ml per day) Prosource TF20 60 ml 1x/d Provides 2420 kcal, 118 gm protein, 1189 ml free water daily  NUTRITION DIAGNOSIS:  Severe Malnutrition related to chronic illness as evidenced by severe muscle depletion, severe fat depletion.  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 Well known to nutrition team from prior admissions and CIR stay. TF protocol entered yesterday and Vital High Protein infusing at 13m/h at the time of assessment. Pt has tolerated this rate well since initation yesterday afternoon. Was previously on 16 hour infusion @ 922mh while at CIR. Will adjust to 24hour infusion to allow for a slower rate while meeting pt's needs. Discussed with RN.  Pt has no complaints and denies issues with TF so far.    Intake/Output Summary (Last 24 hours) at 01/18/2023 1456 Last data filed at 01/18/2023 1200 Gross per 24 hour  Intake 1887.6 ml  Output 950 ml  Net 937.6 ml  Net IO Since Admission: 8,892.05 mL [01/18/23 1456]  Nutritionally Relevant  Medications: Scheduled Meds:  docusate  100 mg Per Tube BID   polyethylene glycol  17 g Per Tube Daily   scopolamine  1 patch Transdermal Q72H   Continuous Infusions:  feeding supplement (OSMOLITE 1.5 CAL)     pantoprazole 8 mg/hr (01/18/23 1200)   PRN Meds:.docusate, ondansetron, polyethylene glycol  Labs Reviewed: K 3.2 BUN 30   NUTRITION - FOCUSED PHYSICAL EXAM: Flowsheet Row Most Recent Value  Orbital Region Moderate depletion  Upper Arm Region Severe depletion  Thoracic and Lumbar Region Moderate depletion  Buccal Region Moderate depletion  Temple Region Severe depletion  Clavicle Bone Region Moderate depletion  Clavicle and Acromion Bone Region Severe depletion  Scapular Bone Region Severe depletion  Dorsal Hand Severe depletion  Patellar Region Moderate depletion  Anterior Thigh Region Moderate depletion  Posterior Calf Region Severe depletion  Edema (RD Assessment) None  Hair Reviewed  Eyes Reviewed  Mouth Reviewed  Skin Reviewed  Nails Reviewed   Diet Order:   Diet Order             Diet NPO time specified  Diet effective now                   EDUCATION NEEDS:  Not appropriate for education at this time  Skin:  Skin Assessment: Reviewed RN Assessment  Last BM:  1/20  Height:  Ht Readings from Last 1 Encounters:  01/06/23 '6\' 2"'$  (1.88 m)    Weight:  Wt Readings from Last 1 Encounters:  01/18/23 70.9 kg  Ideal Body Weight:  83.6 kg  BMI:  Body mass index is 20.07 kg/m.  Estimated Nutritional Needs:  Kcal:  2100-2500 kcal/d Protein:  105-120g/d Fluid:  2.2-2.5L/d    Ranell Patrick, RD, LDN Clinical Dietitian RD pager # available in [facility]  After hours/weekend pager # available in [facility]

## 2023-01-18 NOTE — Progress Notes (Signed)
[facility] ADULT ICU REPLACEMENT PROTOCOL   The patient does apply for the [facility] Adult ICU Electrolyte Replacment Protocol based on the criteria listed below:   1.Exclusion criteria: TCTS, ECMO, Dialysis, and Myasthenia Gravis patients 2. Is GFR >/= 30 ml/min? Yes.    Patient's GFR today is >60 3. Is SCr </= 2? Yes.   Patient's SCr is 1.46 mg/dL 4. Did SCr increase >/= 0.5 in 24 hours? No. 5.Pt's weight >40kg  Yes.   6. Abnormal electrolyte(s): potassium 3.2, mag 1.7  7. Electrolytes replaced per protocol 8.  Call MD STAT for K+ </= 2.5, Phos </= 1, or Mag </= 1 Physician:  n/a  [name] 01/18/2023 5:33 AM

## 2023-01-18 NOTE — Progress Notes (Signed)
Physical Therapy Treatment Patient Details Name: [name] MRN: [id number] DOB: July 06, 1946 Today's Date: 01/18/2023   History of Present Illness 77 y.o. male admitted 1/1 after found on floor.  BSW:[id number] foci of restricted diffusion left frontal lobe and left parietal lobe.  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  Pt (+) flu A and possible aspiration PNA. 1/5 code stroke with increased Rt weakness. 1/8 carotid stent in IR. Transfer to Rehab 1/12 and transfer back to Acute 1/20 due to GIB and VDRF.  Extubated 1/21.  New PT eval ordered 1/22.  PMHx: RA, ILD, DVT/PE, T2DM, HTN, HLD, Esophageal outlet obstructions s/p multiple dilatations and GJ tube placement for FTT, severe protein calorie malnutrition, AAA L Illiac artery aneurysm s/p EVAR    PT Comments    Pt appears disgruntled today, is irritated by lines/leads. Pt tolerated short distance gait in room x2, requires extended seated rest break to recover fatigue. VSS including BP, pt with no complaints of lightheadedness or dizziness throughout session. Pt progressing well, will continue to follow.    Recommendations for follow up therapy are one component of a multi-disciplinary discharge planning process, led by the attending physician.  Recommendations may be updated based on patient status, additional functional criteria and insurance authorization.  Follow Up Recommendations  Acute inpatient rehab (3hours/day)     Assistance Recommended at Discharge Frequent or constant Supervision/Assistance  Patient can return home with the following A little help with walking and/or transfers;A lot of help with bathing/dressing/bathroom;Assistance with cooking/housework;Direct supervision/assist for medications management;Assist for transportation;Assistance with feeding   Equipment Recommendations  Rolling walker (2 wheels)    Recommendations for Other Services Rehab consult     Precautions /  Restrictions Precautions Precautions: Fall Precaution Comments: JG tube, NPO, hypotension, right femoral IV line (nurse approved getting OOB) Restrictions Weight Bearing Restrictions: No     Mobility  Bed Mobility               General bed mobility comments: up in chair    Transfers Overall transfer level: Needs assistance Equipment used: Rolling walker (2 wheels) Transfers: Sit to/from Stand Sit to Stand: Min assist           General transfer comment: light rise and steady assist, STS x3    Ambulation/Gait Ambulation/Gait assistance: Min assist Gait Distance (Feet): 25 Feet (x2 - seated rest) Assistive device: Rolling walker (2 wheels) Gait Pattern/deviations: Step-through pattern, Decreased stride length, Trunk flexed, Drifts right/left Gait velocity: decr     General Gait Details: light steadying assist, cues for upright posture and placement in RW   Stairs             Wheelchair Mobility    Modified Rankin (Stroke Patients Only)       Balance Overall balance assessment: Needs assistance Sitting-balance support: Feet supported, No upper extremity supported Sitting balance-Leahy Scale: Good     Standing balance support: Bilateral upper extremity supported, During functional activity Standing balance-Leahy Scale: Poor                              Cognition Arousal/Alertness: Awake/alert Behavior During Therapy: WFL for tasks assessed/performed Overall Cognitive Status: No family/caregiver present to determine baseline cognitive functioning                                 General Comments: pt is  irritable at times, mostly pleasant        Exercises      General Comments General comments (skin integrity, edema, etc.): BP stable post-transfers and gait      Pertinent Vitals/Pain Pain Assessment Pain Assessment: No/denies pain Pain Intervention(s): Monitored during session    Home Living                           Prior Function            PT Goals (current goals can now be found in the care plan section) Acute Rehab PT Goals Patient Stated Goal: I want to get better PT Goal Formulation: With patient Time For Goal Achievement: 01/31/23 Potential to Achieve Goals: Good Progress towards PT goals: Progressing toward goals    Frequency    Min 3X/week      PT Plan Current plan remains appropriate    Co-evaluation              AM-PAC PT "6 Clicks" Mobility   Outcome Measure  Help needed turning from your back to your side while in a flat bed without using bedrails?: A Little Help needed moving from lying on your back to sitting on the side of a flat bed without using bedrails?: A Little Help needed moving to and from a bed to a chair (including a wheelchair)?: A Little Help needed standing up from a chair using your arms (e.g., wheelchair or bedside chair)?: A Little Help needed to walk in hospital room?: A Lot Help needed climbing 3-5 steps with a railing? : A Lot 6 Click Score: 16    End of Session   Activity Tolerance: Patient limited by fatigue Patient left: with call bell/phone within reach;in chair;with chair alarm set;with nursing/sitter in room Nurse Communication: Mobility status PT Visit Diagnosis: Unsteadiness on feet (R26.81);Muscle weakness (generalized) (M62.81);Difficulty in walking, not elsewhere classified (R26.2)     Time: 2947-6546 PT Time Calculation (min) (ACUTE ONLY): 21 min  Charges:  $Gait Training: 8-22 mins                     Stacie Glaze, PT DPT Acute Rehabilitation Services Pager [phone]  Office [phone]     [place] 01/18/2023, 3:15 PM

## 2023-01-18 NOTE — Progress Notes (Signed)
   NAME:  [name], MRN:  [id number], DOB:  [birth year], LOS: 3 ADMISSION DATE:  01/15/2023, CONSULTATION DATE:  1/20 REFERRING MD:  Dagoberto Ligas, CHIEF COMPLAINT:  melanotic stool   History of Present Illness:  77 y/o male with a very complicated history including RA, esophageal stricture vs achalasia, head and neck cancer, chronic gastritis, stroke, DVT who was admitted in the context of an upper GI bleed on brillinta and ASA.  EGD was attempted, could not be completed due to thick esophageal stricture.  Pertinent  Medical History  CVA DVT GIB Esophagitis Gastritis Duodenal ulcer Oropharyngeal cancer Achalasia RA Chronic steroid use Orthostatic hypotension   Significant Hospital Events: Including procedures, antibiotic start and stop dates in addition to other pertinent events   1/20 Admitting to ICU from CIR for GIB. Unable to get complete EGD due to esophageal stricture. 1/21 foley placed for urinary retention. Extubated   Interim History / Subjective:  Walking around unit No bleeding Trasnfused 1 U PRBC No bowel movement or blood per rectum Feels OK  Objective   Blood pressure (!) 112/49, pulse 82, temperature 98.2 F (36.8 C), temperature source Oral, resp. rate (!) 24, weight 70.9 kg, SpO2 100 %.        Intake/Output Summary (Last 24 hours) at 01/18/2023 1216 Last data filed at 01/18/2023 1200 Gross per 24 hour  Intake 1966.66 ml  Output 950 ml  Net 1016.66 ml   Filed Weights   01/17/23 0500 01/17/23 2149 01/18/23 0259  Weight: 73.4 kg 70.9 kg 70.9 kg    Examination:  General:  Resting comfortably in chair HENT: NCAT OP clear PULM: CTA B, normal effort CV: RRR, no mgr GI: BS+, soft, nontender MSK: normal bulk and tone Neuro: awake, alert, no distress, MAEW   Resolved Hospital Problem list     Assessment & Plan:  Acute upper GI bleed, concern for gastric source of bleeding Acute blood loss anemia due to GI bleed Prior PUD (gastritis/duoedenal  ulcter) Achalasia Esophageal outlet obstrcution L MCA CVA in past L ICA stenossis/p ICA stent on 1/8 on asa and brillinta Unprovoked DVT/PE 2022, pharmacy notes indicate stopped eliquis in 07/2022 when he had a GI bleed RA on methotrexate Orthostatic hypotention> improved Hyperglycemia Urinary retention  Plan: Hold brillinta/asa, see notes from Dr. Carlis Abbott discussing with family Repeat EGD on Thursday Monitor off midodrine PT efforts Continue PPI infusion> transition to BID today Diet per GI Monitor neuro exam Restart antiplatelet agents after EGD Resume MTX after discharge SSI Foley  Remove CVL from femoral vein   Out of ICU, to [place] (right click and "Reselect all SmartList Selections" daily)   Diet/type: NPO DVT prophylaxis: SCD GI prophylaxis: PPI Lines: Central line and No longer needed.  Order written to d/c  Foley:  Yes, and it is still needed Code Status:  full code Last date of multidisciplinary goals of care discussion [1/22]  Critical care time: n/a     Roselie Awkward, MD [place] [facility] Pager: [phone] Cell: [phone] After 7:00 pm call Elink  [phone]

## 2023-01-18 NOTE — Progress Notes (Signed)
Inpatient Rehabilitation Admissions Coordinator   I met with patient at bedside as well as spoke with son, [name], by phone. We are discussing goals for rehab of readmit to CIR vs SNF. I will discuss further with Dr Ranell Patrick and follow up with son and patient tomorrow.  Danne Baxter, RN, MSN Rehab Admissions Coordinator [phone] 01/18/2023 5:31 PM

## 2023-01-18 NOTE — Evaluation (Signed)
Occupational Therapy Evaluation Patient Details Name: [name] MRN: [id number] DOB: 1946/10/26 Today's Date: 01/18/2023   History of Present Illness 76 y.o. male admitted 1/1 after found on floor.  EUM:PNTIRWERX foci of restricted diffusion left frontal lobe and left parietal lobe.  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  Pt (+) flu A and possible aspiration PNA. 1/5 code stroke with increased Rt weakness. 1/8 carotid stent in IR. Transfer to Rehab 1/12 and transfer back to Acute 1/20 due to GIB and VDRF.  Extubated 1/21.  New PT eval ordered 1/22.  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  Patient stating he will ultimately transitioning home with his ex spouse assisting as needed.  Deficits impacting independence are listed below.  OT to follow in the acute setting to address deficits and assist with transition to next level.  AIR has been recommended, and patient is unsure if he returning to AIR, or if he is able to transition home with assist and HH therapies.        Recommendations for follow up therapy are one component of a multi-disciplinary discharge planning process, led by the attending physician.  Recommendations may be updated based on patient status, additional functional criteria and insurance authorization.   Follow Up Recommendations  Acute inpatient rehab (3hours/day)     Assistance Recommended at Discharge Frequent or constant Supervision/Assistance  Patient can return home with the following A lot of help with walking and/or transfers;A lot of help with bathing/dressing/bathroom;Assistance with cooking/housework;Direct supervision/assist for medications management;Direct supervision/assist for  financial management;Assist for transportation;Help with stairs or ramp for entrance    Functional Status Assessment  Patient has had a recent decline in their functional status and demonstrates the ability to make significant improvements in function in a reasonable and predictable amount of time.  Equipment Recommendations  None recommended by OT    Recommendations for Other Services       Precautions / Restrictions Precautions Precautions: Fall Precaution Comments: JG tube, NPO, hypotension, right femoral IV line (nurse approved getting OOB) Restrictions Weight Bearing Restrictions: No      Mobility Bed Mobility Overal bed mobility: Modified Independent                  Transfers Overall transfer level: Needs assistance Equipment used: 1 person hand held assist Transfers: Sit to/from Stand, Bed to chair/wheelchair/BSC Sit to Stand: Min guard     Step pivot transfers: Min guard            Balance Overall balance assessment: Needs assistance Sitting-balance support: Feet supported, No upper extremity supported Sitting balance-Leahy Scale: Good     Standing balance support: Single extremity supported Standing balance-Leahy Scale: Fair                             ADL either performed or assessed with clinical judgement   ADL   Eating/Feeding: Set up;Sitting   Grooming: Wash/dry hands;Wash/dry face;Set up   Upper Body Bathing: Minimal assistance;Sitting   Lower Body Bathing: Sit to/from stand;Minimal assistance   Upper Body Dressing : Minimal assistance;Sitting   Lower Body Dressing: Sit to/from stand;Minimal assistance   Toilet Transfer: Min guard;Ambulation   Toileting- Clothing Manipulation and Hygiene:  Min guard;Sit to/from stand               Vision   Vision Assessment?: No apparent visual deficits     Perception     Praxis      Pertinent Vitals/Pain Pain Assessment Pain Assessment: No/denies pain Pain  Intervention(s): Monitored during session     Hand Dominance Right   Extremity/Trunk Assessment Upper Extremity Assessment Upper Extremity Assessment: Generalized weakness RUE Deficits / Details: pt maintains hand and digit flexion with very limited strength into extension RUE Sensation: WNL RUE Coordination: decreased fine motor   Lower Extremity Assessment Lower Extremity Assessment: Defer to PT evaluation   Cervical / Trunk Assessment Cervical / Trunk Assessment: Kyphotic   Communication Communication Communication: No difficulties   Cognition Arousal/Alertness: Awake/alert Behavior During Therapy: WFL for tasks assessed/performed Overall Cognitive Status: No family/caregiver present to determine baseline cognitive functioning                                       General Comments   Watch BP SPB dropped from 118 to 95 with dizziness noted.     Exercises     Shoulder Instructions      Home Living Family/patient expects to be discharged to:: Private residence Living Arrangements: Other (Comment);Spouse/significant other Available Help at Discharge: Available 24 hours/day Type of Home: House Home Access: Stairs to enter [employer] of Steps: 2   Home Layout: One level     Bathroom Shower/Tub: Occupational psychologist: Standard Bathroom Accessibility: Yes How Accessible: Accessible via walker Home Equipment: [place] held Chief Technology Officer (4 wheels)   Additional 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; retired  Lives With: Alone    Prior Functioning/Environment Prior Level of Function : Independent/Modified Independent             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 Comments: He reports he can complete his own selfcare        OT Problem List: Decreased strength;Decreased range of motion;Decreased coordination;Impaired balance (sitting and/or standing);Pain;Impaired UE functional use;Decreased  knowledge of use of DME or AE      OT Treatment/Interventions: Self-care/ADL training;Therapeutic exercise;Patient/family education;Balance training;Neuromuscular education;Therapeutic activities;DME and/or AE instruction;Cognitive remediation/compensation    OT Goals(Current goals can be found in the care plan section) Acute Rehab OT Goals Patient Stated Goal: Return home OT Goal Formulation: With patient Time For Goal Achievement: 02/01/23 ADL Goals Pt Will Perform Grooming: with modified independence;standing Pt Will Perform Lower Body Dressing: with modified independence;sit to/from stand Pt Will Transfer to Toilet: with modified independence;regular height toilet;ambulating  OT Frequency: Min 2X/week    Co-evaluation              AM-PAC OT "6 Clicks" Daily Activity     Outcome Measure Help from another person eating meals?: A Little Help from another person taking care of personal grooming?: A Little Help from another person toileting, which includes using toliet, bedpan, or urinal?: A Little Help from another person bathing (including washing, rinsing, drying)?: A Little Help from another person to put on and taking off regular upper body clothing?: A Little Help from another person to put on and taking off regular lower body clothing?: A Little 6 Click Score: 18   End of Session Nurse Communication: Mobility status  Activity Tolerance: Patient tolerated treatment well Patient left: in chair;with nursing/sitter in room  OT Visit Diagnosis: Unsteadiness on feet (R26.81);Muscle  weakness (generalized) (M62.81)                Time: 1010-1030 OT Time Calculation (min): 20 min Charges:  OT General Charges $OT Visit: 1 Visit OT Evaluation $OT Eval Moderate Complexity: 1 Mod  01/18/2023  RP, OTR/L  Acute Rehabilitation Services  Office:  [phone]   Metta Clines 01/18/2023, 11:07 AM

## 2023-01-18 NOTE — H&P (View-Only) (Signed)
Daily Rounding Note  01/18/2023, 1:02 PM  LOS: 3 days   SUBJECTIVE:   Chief complaint: Severe esophageal stricture.  GI bleed.  Patient generally feels lousy.  Is able to swallow his secretions. No blood per PEG.  No bloody stools.  Brown bowel movement this morning.  OBJECTIVE:         Vital signs in last 24 hours:    Temp:  [97.7 F (36.5 C)-99.4 F (37.4 C)] 98.2 F (36.8 C) (01/23 1149) Pulse Rate:  [73-84] 82 (01/23 1200) Resp:  [13-28] 24 (01/23 1200) BP: (97-121)/(41-58) 112/49 (01/23 1200) SpO2:  [96 %-100 %] 100 % (01/23 1200) Weight:  [70.9 kg] 70.9 kg (01/23 0259) Last BM Date : 01/15/22 Filed Weights   01/17/23 0500 01/17/23 2149 01/18/23 0259  Weight: 73.4 kg 70.9 kg 70.9 kg   General: Looks tired, chronically ill.  Alert, comfortable Heart: RRR. Chest: Diminished breath sounds throughout.  Cough with a lot of audible phlegm but not able to express the phlegm. Abdomen: Soft, nontender.  PEG site on left benign appearing. Extremities: No CCE. Neuro/Psych: Alert.  Oriented x 3.  Moves all 4 limbs.  Intake/Output from previous day: 01/22 0701 - 01/23 0700 In: 699.1 [I.V.:247.1; NG/GT:452] Out: 650 [Urine:650]  Intake/Output this shift: Total I/O In: 1267.6 [I.V.:65; Blood:350; Other:360; NG/GT:240; IV Piggyback:252.6] Out: 300 [Urine:300]  Lab Results: Recent Labs    01/17/23 0547 01/17/23 2153 01/18/23 0600 01/18/23 1217  WBC 13.4* 11.4* 9.1  --   HGB 7.0* 7.1* 6.7* 8.3*  HCT 21.0* 21.1* 20.0* 24.8*  PLT 230 211 210  --    BMET Recent Labs    01/16/23 0400 01/17/23 0547 01/18/23 0213  NA 135 140 140  K 4.0 3.7 3.2*  CL 105 108 109  CO2 20* 22 23  GLUCOSE 186* 124* 146*  BUN 60* 38* 30*  CREATININE 1.04 1.09 1.01  CALCIUM 8.3* 8.2* 8.1*   LFT No results for input(s): "PROT", "ALBUMIN", "AST", "ALT", "ALKPHOS", "BILITOT", "BILIDIR", "IBILI" in the last 72 hours. PT/INR No  results for input(s): "LABPROT", "INR" in the last 72 hours. Hepatitis Panel No results for input(s): "HEPBSAG", "HCVAB", "HEPAIGM", "HEPBIGM" in the last 72 hours.  Studies/Results: No results found.  Scheduled Meds:  Chlorhexidine Gluconate Cloth  6 each Topical Daily   docusate  100 mg Per Tube BID   [START ON 01/19/2023] pantoprazole  40 mg Intravenous Q12H   polyethylene glycol  17 g Per Tube Daily   scopolamine  1 patch Transdermal Q72H   Continuous Infusions:  sodium chloride     feeding supplement (OSMOLITE 1.5 CAL)     pantoprazole 8 mg/hr (01/18/23 1200)   PRN Meds:.docusate, ondansetron (ZOFRAN) IV, mouth rinse, polyethylene glycol   ASSESMENT:     GIB: Melena, scant blood in emesis.  Fresh blood lavaged from G-tube over weekend.  EGD 01/15/2023: Fluid in esophagus indicating stasis, poor esophageal clearance.  Severe, benign appearing esophageal stenosis/stricture with pinhole lumen at distal esophagus.  Neither conventional endoscope or peds endoscope able to pass this area.  MD unable to dilate stricture due to recent antiplatelet use.  Unable to assess stomach.  No source of bleeding seen in examined esophagus.  Suggested CTA/embolization, not yet pursued.  Had August 2023 EGD showing nonbleeding duodenal ulcers, 1 with a flat pigmented spot, the remaining were clean-based.  Also benign esophageal stenosis, small HH, normal stomach.  Chronic GI meds include Pepcid 20 mg  daily, Protonix 40 mg bid, Carafate 1 g qid.  Melena has resolved.   Blood loss anemia, Hgb to 6.2.Marland Kitchen 2 PRBC.. 8.. 6.7.. 3rd PRBC.. 8.3.    Chronic p.o. iron pta.       Severe esophageal stricture.  MD unable to pass scope beyond this.   Notes mention chronic dysphagia, multiple prior esophageal dilatations and recurrent aspiration pneumonia.     PEG placed 07/2022 at [facility] in setting of recurrent aspiration pneumonia.     CVA.  01/03/2023 left ICA stenting.  ASA, Brilinta on hold, last dose 1/19 at 2100.       RA, chronic prednisone.  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 PEG in place.       Hx OP dysphagia.  Achalasia.      Hx DVT, PE.     PLAN     Egd, Esoph dilatation planned for thurs 11/25 8 AM    72 h Protonix drip finishes this afternoon.  Starts 40 IV bid after that.      Azucena Freed  01/18/2023, 1:02 PM Phone [phone]   Attending physician's note   I have taken a history, reviewed the chart and examined the patient. I performed a substantive portion of this encounter, including complete performance of at least one of the key components, in conjunction with the APP. I agree with the APP's note, impression and recommendations.    Dr. Carlis Abbott discussed with patient's family and they opted to continue to hold Brilinta and proceed with EGD on Thursday Continue tube feeds at slow rate as tolerated Continue PPI Monitor hemoglobin and transfuse to>7   The patient was provided an opportunity to ask questions and all were answered. The patient agreed with the plan and demonstrated an understanding of the instructions.   Damaris Hippo , MD [phone]

## 2023-01-18 NOTE — Progress Notes (Signed)
eLink Physician-Brief Progress Note Patient Name: SHANTE MAYSONET DOB: 1946-09-05 MRN: [id number]   Date of Service  01/18/2023  HPI/Events of Note  Hg 6.7  eICU Interventions  Transfuse 1 U PRBC     Intervention Category Intermediate Interventions: Bleeding - evaluation and treatment with blood products  Jessen Siegman Rodman Pickle 01/18/2023, 6:30 AM

## 2023-01-19 DIAGNOSIS — R6889 Other general symptoms and signs: Secondary | ICD-10-CM | POA: Insufficient documentation

## 2023-01-19 DIAGNOSIS — R632 Polyphagia: Secondary | ICD-10-CM

## 2023-01-19 LAB — BPAM RBC
Blood Product Expiration Date: 202401302359
Blood Product Expiration Date: 202402152359
Blood Product Expiration Date: 202402152359
ISSUE DATE / TIME: 202401201353
ISSUE DATE / TIME: 202401201353
ISSUE DATE / TIME: 202401230746
Unit Type and Rh: 6200
Unit Type and Rh: 6200
Unit Type and Rh: 6200

## 2023-01-19 LAB — TYPE AND SCREEN
ABO/RH(D): A POS
Antibody Screen: NEGATIVE
Unit division: 0
Unit division: 0
Unit division: 0

## 2023-01-19 LAB — MAGNESIUM: Magnesium: 1.7 mg/dL (ref 1.7–2.4)

## 2023-01-19 LAB — PHOSPHORUS: Phosphorus: 3.2 mg/dL (ref 2.5–4.6)

## 2023-01-19 LAB — GLUCOSE, CAPILLARY
Glucose-Capillary: 160 mg/dL — ABNORMAL HIGH (ref 70–99)
Glucose-Capillary: 154 mg/dL — ABNORMAL HIGH (ref 70–99)
Glucose-Capillary: 162 mg/dL — ABNORMAL HIGH (ref 70–99)

## 2023-01-19 LAB — CBC
HCT: 24.5 % — ABNORMAL LOW (ref 39.0–52.0)
Hemoglobin: 8.2 g/dL — ABNORMAL LOW (ref 13.0–17.0)
MCH: 29.8 pg (ref 26.0–34.0)
MCHC: 33.5 g/dL (ref 30.0–36.0)
MCV: 89.1 fL (ref 80.0–100.0)
Platelets: 223 10*3/uL (ref 150–400)
RBC: 2.75 MIL/uL — ABNORMAL LOW (ref 4.22–5.81)
RDW: 17 % — ABNORMAL HIGH (ref 11.5–15.5)
WBC: 8.8 10*3/uL (ref 4.0–10.5)
nRBC: 0 % (ref 0.0–0.2)

## 2023-01-19 LAB — BASIC METABOLIC PANEL
Anion gap: 7 (ref 5–15)
BUN: 24 mg/dL — ABNORMAL HIGH (ref 8–23)
CO2: 24 mmol/L (ref 22–32)
Calcium: 8.3 mg/dL — ABNORMAL LOW (ref 8.9–10.3)
Chloride: 107 mmol/L (ref 98–111)
Creatinine, Ser: 0.86 mg/dL (ref 0.61–1.24)
GFR, Estimated: >60 mL/min (ref >60)
Glucose, Bld: 155 mg/dL — ABNORMAL HIGH (ref 70–99)
Potassium: 3.4 mmol/L — ABNORMAL LOW (ref 3.5–5.1)
Sodium: 138 mmol/L (ref 135–145)

## 2023-01-19 MED ORDER — MAGNESIUM SULFATE 2 GM/50ML IV SOLN
2.0000 g | Freq: Once | INTRAVENOUS | Status: AC
  Administered 2023-01-19: 2 g via INTRAVENOUS
  Filled 2023-01-19: qty 50

## 2023-01-19 MED ORDER — POTASSIUM CHLORIDE 20 MEQ PO PACK
40.0000 meq | PACK | Freq: Once | ORAL | Status: AC
  Administered 2023-01-19: 40 meq
  Filled 2023-01-19: qty 2

## 2023-01-19 NOTE — Progress Notes (Signed)
NAME:  [name], MRN:  [id number], DOB:  June 20, 1946, LOS: 4 ADMISSION DATE:  01/15/2023  Subjective  Patient evaluated at bedside this AM. Reports he is doing okay today, no current complaints. Has not had BM yet today nor has he noticed any bleeding. Denies nausea, vomiting. Discuss plan for EGD tomorrow AM.  Objective   Blood pressure (!) 106/56, pulse 74, temperature 98.5 F (36.9 C), temperature source Axillary, resp. rate 16, weight 70.9 kg, SpO2 99 %.     Intake/Output Summary (Last 24 hours) at 01/19/2023 0943 Last data filed at 01/19/2023 0200 Gross per 24 hour  Intake 1350.15 ml  Output 830 ml  Net 520.15 ml   Filed Weights   01/17/23 0500 01/17/23 2149 01/18/23 0259  Weight: 73.4 kg 70.9 kg 70.9 kg   Physical Exam: General: Tired, chronically ill-appearing person in bed CV: Regular rate, rhythm. No murmurs appreciated. Warm extremities.  Pulm: Normal work of breathing on room air. Clear to auscultation bilaterally. Abdomen: Soft, non-tender, non-distended. PEG tube in place. MSK: Normal bulk, tone. No peripheral edema. Neuro: Awake, alert, conversing appropriately. Grossly non-focal. Psych: Normal mood, affect, speech.   Labs       Latest Ref Rng & Units 01/18/2023   12:17 PM 01/18/2023    6:00 AM 01/17/2023    9:53 PM  CBC  WBC 4.0 - 10.5 K/uL  9.1  11.4   Hemoglobin 13.0 - 17.0 g/dL 8.3  6.7  7.1   Hematocrit 39.0 - 52.0 % 24.8  20.0  21.1   Platelets 150 - 400 K/uL  210  211       Latest Ref Rng & Units 01/19/2023   12:59 AM 01/18/2023    2:13 AM 01/17/2023    5:47 AM  BMP  Glucose 70 - 99 mg/dL 155  146  124   BUN 8 - 23 mg/dL 24  30  38   Creatinine 0.61 - 1.24 mg/dL 0.86  1.01  1.09   Sodium 135 - 145 mmol/L 138  140  140   Potassium 3.5 - 5.1 mmol/L 3.4  3.2  3.7   Chloride 98 - 111 mmol/L 107  109  108   CO2 22 - 32 mmol/L '24  23  22   '$ Calcium 8.9 - 10.3 mg/dL 8.3  8.1  8.2     Summary  Lauren Aguayo is 77yo person living with  severe esophageal stricture s/p multiple dilations, rheumatoid arthritis on chronic steroids, interstitial lung disease, previous unprovoked DVT/PE not on anticoagulation 2/2 GIB, recent CVA s/p ICA stenting on DAPT, type II diabetes mellitus, severe protein-calorie malnutrition, 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  Assessment & Plan:  Principal Problem:   GIB (gastrointestinal bleeding) Active Problems:   ABLA (acute blood loss anemia)   Hematemesis   Anemia, posthemorrhagic, acute   On mechanically assisted ventilation (HCC)   Acute respiratory failure with hypoxia (HCC)   PEG (percutaneous endoscopic gastrostomy) status ([place])   Long term current use of anticoagulant therapy   Debility   Advanced care planning/counseling discussion   Acute upper gastrointestinal bleeding  #Acute upper GI bleed #Acute blood loss anemia #Esophageal outlet obstruction v achalasia #Peptic ulcer disease Patient has received a total of 3u pRBC since arrival, last 1u on 1/23.  Since that time he has not experienced any further bleeding. Last Hgb 8.3 yesterday. GI is following and is planning for EGD with esophageal dilation tomorrow morning. Patient previously on DAPT 2/2 recent CVA, will need multi-disciplinary discussion regarding re-starting any antiplatelet therapy for Mr. Keyworth after EGD.  - Repeat CBC today - IV PPI '40mg'$  BID - EGD, esophageal dilation 1/25 AM - Discuss single vs dual antiplatelet therapy following EGD - Appreciate GI recommendations and assistance  #Hyperglycemia Sugars have been well-controlled with sliding scale. - SSI  #Rheumatoid arthritis Currently holding steroids in setting of acute GI bleed. Also holding home methotrexate. Blood pressure is  maintaining well, no signs of hypoperfusion.  - Hold home steroids, methotrexate  #Recent CVA s/p ICA stenting Holding antiplatelet therapy given GIB. Would discuss with neuro and GI prior to re-starting these.   #Protein-calorie malnutrition #PEG tube dependent Receiving tube feeds, will plan to d/c these at [facility] for procedure in AM. - Stop TF @ [facility] - Appreciate RD assistance  Best practice:  DIET: NPO, TF IVF: n/a DVT PPX: SCD BOWEL: senna CODE: FULL FAM COM: n/a  Sanjuan Dame, MD Internal Medicine Resident PGY-3 PAGER: [phone] 01/19/2023 9:43 AM  If after hours (below), please contact on-call pager: [phone] 5PM-7AM Monday-Friday 1PM-7AM Saturday-Sunday

## 2023-01-19 NOTE — Plan of Care (Signed)
  Problem: Education: Goal: Knowledge of General Education information will improve Description: Including pain rating scale, medication(s)/side effects and non-pharmacologic comfort measures Outcome: Progressing   Problem: Health Behavior/Discharge Planning: Goal: Ability to manage health-related needs will improve Outcome: Progressing   Problem: Clinical Measurements: Goal: Ability to maintain clinical measurements within normal limits will improve Outcome: Progressing Goal: Will remain free from infection Outcome: Progressing Goal: Diagnostic test results will improve Outcome: Progressing Goal: Respiratory complications will improve Outcome: Progressing Goal: Cardiovascular complication will be avoided Outcome: Progressing   Problem: Activity: Goal: Risk for activity intolerance will decrease Outcome: Progressing   Problem: Nutrition: Goal: Adequate nutrition will be maintained Outcome: Progressing   Problem: Coping: Goal: Level of anxiety will decrease Outcome: Progressing   Problem: Elimination: Goal: Will not experience complications related to bowel motility Outcome: Progressing Goal: Will not experience complications related to urinary retention Outcome: Progressing   Problem: Pain Managment: Goal: General experience of comfort will improve Outcome: Progressing   Problem: Safety: Goal: Ability to remain free from injury will improve Outcome: Progressing   Problem: Skin Integrity: Goal: Risk for impaired skin integrity will decrease Outcome: Progressing   Problem: Education: Goal: Understanding of CV disease, CV risk reduction, and recovery process will improve Outcome: Progressing Goal: Individualized Educational Video(s) Outcome: Progressing   Problem: Activity: Goal: Ability to return to baseline activity level will improve Outcome: Progressing   Problem: Cardiovascular: Goal: Ability to achieve and maintain adequate cardiovascular perfusion  will improve Outcome: Progressing Goal: Vascular access site(s) Level 0-1 will be maintained Outcome: Progressing   Problem: Health Behavior/Discharge Planning: Goal: Ability to safely manage health-related needs after discharge will improve Outcome: Progressing   Problem: Education: Goal: Knowledge of disease or condition will improve Outcome: Progressing Goal: Knowledge of secondary prevention will improve (MUST DOCUMENT ALL) Outcome: Progressing Goal: Knowledge of patient specific risk factors will improve [place]ta [place] N/A or DELETE if not current risk factor) Outcome: Progressing   Problem: Ischemic Stroke/TIA Tissue Perfusion: Goal: Complications of ischemic stroke/TIA will be minimized Outcome: Progressing   Problem: Coping: Goal: Will verbalize positive feelings about self Outcome: Progressing Goal: Will identify appropriate support needs Outcome: Progressing   Problem: Health Behavior/Discharge Planning: Goal: Ability to manage health-related needs will improve Outcome: Progressing Goal: Goals will be collaboratively established with patient/family Outcome: Progressing   Problem: Self-Care: Goal: Ability to participate in self-care as condition permits will improve Outcome: Progressing Goal: Verbalization of feelings and concerns over difficulty with self-care will improve Outcome: Progressing Goal: Ability to communicate needs accurately will improve Outcome: Progressing   Problem: Nutrition: Goal: Risk of aspiration will decrease Outcome: Progressing Goal: Dietary intake will improve Outcome: Progressing

## 2023-01-19 NOTE — Progress Notes (Addendum)
K+ 3.4, Mag 1.7 Replaced per protocol

## 2023-01-19 NOTE — Care Management Important Message (Signed)
Important Message  Patient Details  Name: [name] MRN: [id number] Date of Birth: [birth year]   Medicare Important Message Given:  Yes     Hannah Beat 01/19/2023, 1:15 PM

## 2023-01-19 NOTE — Progress Notes (Signed)
Pt set for EGD, esoph dilatation tomorrow. Hx recurrent esoph strictures dilated, followed at [facility].  Obstructing stricture at distal esophagus at EGD 1/20 Melena and scant bloody emesis Needs AC for recent CVA and ICA stenting on 01/03/23.  Clinically stable in past 24 h.  Hgb stable 8.2.  EGD, dilation tomorrow 10 AM  Azucena Freed PA=C

## 2023-01-20 ENCOUNTER — Inpatient Hospital Stay (HOSPITAL_COMMUNITY): Payer: Medicare Other | Admitting: Anesthesiology

## 2023-01-20 ENCOUNTER — Encounter (HOSPITAL_COMMUNITY): Payer: Self-pay | Admitting: Student

## 2023-01-20 ENCOUNTER — Inpatient Hospital Stay (HOSPITAL_COMMUNITY): Payer: Medicare Other

## 2023-01-20 ENCOUNTER — Encounter (HOSPITAL_COMMUNITY)
Admission: AD | Disposition: A | Discharge status: Skilled Nursing Facility | Payer: Self-pay | Source: Skilled Nursing Facility | Attending: Student in an Organized Health Care Education/Training Program

## 2023-01-20 DIAGNOSIS — K222 Esophageal obstruction: Secondary | ICD-10-CM

## 2023-01-20 DIAGNOSIS — K449 Diaphragmatic hernia without obstruction or gangrene: Secondary | ICD-10-CM

## 2023-01-20 DIAGNOSIS — I119 Hypertensive heart disease without heart failure: Secondary | ICD-10-CM

## 2023-01-20 DIAGNOSIS — K2081 Other esophagitis with bleeding: Secondary | ICD-10-CM

## 2023-01-20 DIAGNOSIS — Z87891 Personal history of nicotine dependence: Secondary | ICD-10-CM

## 2023-01-20 HISTORY — PX: ESOPHAGOGASTRODUODENOSCOPY (EGD) WITH PROPOFOL: SHX5813

## 2023-01-20 LAB — CBC
HCT: 26.1 % — ABNORMAL LOW (ref 39.0–52.0)
Hemoglobin: 8.3 g/dL — ABNORMAL LOW (ref 13.0–17.0)
MCH: 28.8 pg (ref 26.0–34.0)
MCHC: 31.8 g/dL (ref 30.0–36.0)
MCV: 90.6 fL (ref 80.0–100.0)
Platelets: 220 10*3/uL (ref 150–400)
RBC: 2.88 MIL/uL — ABNORMAL LOW (ref 4.22–5.81)
RDW: 16.8 % — ABNORMAL HIGH (ref 11.5–15.5)
WBC: 8.2 10*3/uL (ref 4.0–10.5)
nRBC: 0 % (ref 0.0–0.2)

## 2023-01-20 LAB — GLUCOSE, CAPILLARY
Glucose-Capillary: 113 mg/dL — ABNORMAL HIGH (ref 70–99)
Glucose-Capillary: 117 mg/dL — ABNORMAL HIGH (ref 70–99)
Glucose-Capillary: 126 mg/dL — ABNORMAL HIGH (ref 70–99)
Glucose-Capillary: 138 mg/dL — ABNORMAL HIGH (ref 70–99)
Glucose-Capillary: 144 mg/dL — ABNORMAL HIGH (ref 70–99)
Glucose-Capillary: 151 mg/dL — ABNORMAL HIGH (ref 70–99)
Glucose-Capillary: 181 mg/dL — ABNORMAL HIGH (ref 70–99)

## 2023-01-20 LAB — BASIC METABOLIC PANEL
Anion gap: 8 (ref 5–15)
BUN: 21 mg/dL (ref 8–23)
CO2: 25 mmol/L (ref 22–32)
Calcium: 8.4 mg/dL — ABNORMAL LOW (ref 8.9–10.3)
Chloride: 103 mmol/L (ref 98–111)
Creatinine, Ser: 0.77 mg/dL (ref 0.61–1.24)
GFR, Estimated: >60 mL/min (ref >60)
Glucose, Bld: 132 mg/dL — ABNORMAL HIGH (ref 70–99)
Potassium: 3.8 mmol/L (ref 3.5–5.1)
Sodium: 136 mmol/L (ref 135–145)

## 2023-01-20 LAB — PHOSPHORUS: Phosphorus: 3.7 mg/dL (ref 2.5–4.6)

## 2023-01-20 LAB — MAGNESIUM: Magnesium: 1.7 mg/dL (ref 1.7–2.4)

## 2023-01-20 SURGERY — ESOPHAGOGASTRODUODENOSCOPY (EGD) WITH PROPOFOL
Anesthesia: Monitor Anesthesia Care

## 2023-01-20 MED ORDER — DIATRIZOATE MEGLUMINE & SODIUM 66-10 % PO SOLN
ORAL | Status: AC
  Filled 2023-01-20: qty 30

## 2023-01-20 MED ORDER — PROPOFOL 500 MG/50ML IV EMUL
INTRAVENOUS | Status: DC | PRN
  Administered 2023-01-20: 100 ug/kg/min via INTRAVENOUS

## 2023-01-20 MED ORDER — OSMOLITE 1.5 CAL PO LIQD
1000.0000 mL | ORAL | Status: AC
  Administered 2023-01-21: 1000 mL
  Filled 2023-01-20 (×3): qty 1000

## 2023-01-20 MED ORDER — PROPOFOL 10 MG/ML IV BOLUS
INTRAVENOUS | Status: DC | PRN
  Administered 2023-01-20: 80 mg via INTRAVENOUS

## 2023-01-20 MED ORDER — LIDOCAINE 2% (20 MG/ML) 5 ML SYRINGE
INTRAMUSCULAR | Status: DC | PRN
  Administered 2023-01-20: 100 mg via INTRAVENOUS

## 2023-01-20 MED ORDER — GLYCOPYRROLATE 0.2 MG/ML IJ SOLN
INTRAMUSCULAR | Status: DC | PRN
  Administered 2023-01-20: .2 mg via INTRAVENOUS

## 2023-01-20 MED ORDER — SUCRALFATE 1 GM/10ML PO SUSP
1.0000 g | Freq: Four times a day (QID) | ORAL | Status: DC
  Administered 2023-01-20 – 2023-01-23 (×10): 1 g via ORAL
  Filled 2023-01-20 (×10): qty 10

## 2023-01-20 MED ORDER — LACTATED RINGERS IV SOLN: INTRAVENOUS | Status: DC

## 2023-01-20 MED ORDER — PHENYLEPHRINE 80 MCG/ML (10ML) SYRINGE FOR IV PUSH (FOR BLOOD PRESSURE SUPPORT)
PREFILLED_SYRINGE | INTRAVENOUS | Status: DC | PRN
  Administered 2023-01-20 (×2): 160 ug via INTRAVENOUS

## 2023-01-20 SURGICAL SUPPLY — 15 items
BLOCK BITE 60FR ADLT L/F BLUE (MISCELLANEOUS) ×1 IMPLANT
ELECT REM PT RETURN 9FT ADLT (ELECTROSURGICAL) IMPLANT
ELECTRODE REM PT RTRN 9FT ADLT (ELECTROSURGICAL) IMPLANT
FORCEP RJ3 GP 1.8X160 W-NEEDLE (CUTTING FORCEPS) IMPLANT
FORCEPS BIOP RAD 4 LRG CAP 4 (CUTTING FORCEPS) IMPLANT
NDL SCLEROTHERAPY 25GX240 (NEEDLE) IMPLANT
NEEDLE SCLEROTHERAPY 25GX240 (NEEDLE) IMPLANT
PROBE APC STR FIRE (PROBE) IMPLANT
PROBE INJECTION GOLD (MISCELLANEOUS)
PROBE INJECTION GOLD 7FR (MISCELLANEOUS) IMPLANT
SNARE SHORT THROW 13M SML OVAL (MISCELLANEOUS) IMPLANT
SYR 50ML LL SCALE MARK (SYRINGE) IMPLANT
TUBING ENDO SMARTCAP PENTAX (MISCELLANEOUS) ×2 IMPLANT
TUBING IRRIGATION ENDOGATOR (MISCELLANEOUS) ×1 IMPLANT
WATER STERILE IRR 1000ML POUR (IV SOLUTION) IMPLANT

## 2023-01-20 NOTE — Plan of Care (Signed)
Problem: Education: Goal: Knowledge of General Education information will improve Description: Including pain rating scale, medication(s)/side effects and non-pharmacologic comfort measures 01/20/2023 0808 by Birdie Hopes, RN Outcome: Progressing 01/19/2023 1925 by Birdie Hopes, RN Outcome: Progressing   Problem: Health Behavior/Discharge Planning: Goal: Ability to manage health-related needs will improve 01/20/2023 0808 by Birdie Hopes, RN Outcome: Progressing 01/19/2023 1925 by Birdie Hopes, RN Outcome: Progressing   Problem: Clinical Measurements: Goal: Ability to maintain clinical measurements within normal limits will improve 01/20/2023 0808 by Birdie Hopes, RN Outcome: Progressing 01/19/2023 1925 by Birdie Hopes, RN Outcome: Progressing Goal: Will remain free from infection 01/20/2023 0808 by Birdie Hopes, RN Outcome: Progressing 01/19/2023 1925 by Birdie Hopes, RN Outcome: Progressing Goal: Diagnostic test results will improve 01/20/2023 0808 by Birdie Hopes, RN Outcome: Progressing 01/19/2023 1925 by Birdie Hopes, RN Outcome: Progressing Goal: Respiratory complications will improve 01/20/2023 0808 by Birdie Hopes, RN Outcome: Progressing 01/19/2023 1925 by Birdie Hopes, RN Outcome: Progressing Goal: Cardiovascular complication will be avoided 01/20/2023 0808 by Birdie Hopes, RN Outcome: Progressing 01/19/2023 1925 by Birdie Hopes, RN Outcome: Progressing   Problem: Activity: Goal: Risk for activity intolerance will decrease 01/20/2023 0808 by Birdie Hopes, RN Outcome: Progressing 01/19/2023 1925 by Birdie Hopes, RN Outcome: Progressing   Problem: Nutrition: Goal: Adequate nutrition will be maintained 01/20/2023 0808 by Birdie Hopes, RN Outcome: Progressing 01/19/2023 1925 by Birdie Hopes, RN Outcome: Progressing   Problem: Coping: Goal: Level of anxiety will decrease 01/20/2023 0808 by Birdie Hopes, RN Outcome: Progressing 01/19/2023 1925 by Birdie Hopes,  RN Outcome: Progressing   Problem: Elimination: Goal: Will not experience complications related to bowel motility 01/20/2023 0808 by Birdie Hopes, RN Outcome: Progressing 01/19/2023 1925 by Birdie Hopes, RN Outcome: Progressing Goal: Will not experience complications related to urinary retention 01/20/2023 0808 by Birdie Hopes, RN Outcome: Progressing 01/19/2023 1925 by Birdie Hopes, RN Outcome: Progressing   Problem: Pain Managment: Goal: General experience of comfort will improve 01/20/2023 0808 by Birdie Hopes, RN Outcome: Progressing 01/19/2023 1925 by Birdie Hopes, RN Outcome: Progressing   Problem: Safety: Goal: Ability to remain free from injury will improve 01/20/2023 0808 by Birdie Hopes, RN Outcome: Progressing 01/19/2023 1925 by Birdie Hopes, RN Outcome: Progressing   Problem: Skin Integrity: Goal: Risk for impaired skin integrity will decrease 01/20/2023 0808 by Birdie Hopes, RN Outcome: Progressing 01/19/2023 1925 by Birdie Hopes, RN Outcome: Progressing   Problem: Education: Goal: Understanding of CV disease, CV risk reduction, and recovery process will improve 01/20/2023 0808 by Birdie Hopes, RN Outcome: Progressing 01/19/2023 1925 by Birdie Hopes, RN Outcome: Progressing Goal: Individualized Educational Video(s) 01/20/2023 0808 by Birdie Hopes, RN Outcome: Progressing 01/19/2023 1925 by Birdie Hopes, RN Outcome: Progressing   Problem: Activity: Goal: Ability to return to baseline activity level will improve 01/20/2023 0808 by Birdie Hopes, RN Outcome: Progressing 01/19/2023 1925 by Birdie Hopes, RN Outcome: Progressing   Problem: Cardiovascular: Goal: Ability to achieve and maintain adequate cardiovascular perfusion will improve 01/20/2023 0808 by Birdie Hopes, RN Outcome: Progressing 01/19/2023 1925 by Birdie Hopes, RN Outcome: Progressing Goal: Vascular access site(s) Level 0-1 will be maintained 01/20/2023 0808 by Birdie Hopes, RN Outcome:  Progressing 01/19/2023 1925 by Birdie Hopes, RN Outcome: Progressing   Problem: Health Behavior/Discharge Planning: Goal: Ability to safely manage health-related needs after discharge will improve 01/20/2023 0808 by Birdie Hopes, RN Outcome: Progressing 01/19/2023 1925 by Birdie Hopes, RN Outcome: Progressing   Problem: Education: Goal: Knowledge of disease or condition will improve 01/20/2023 0808 by Birdie Hopes, RN Outcome: Progressing 01/19/2023  1925 by Birdie Hopes, RN Outcome: Progressing Goal: Knowledge of secondary prevention will improve (MUST DOCUMENT ALL) 01/20/2023 0808 by Birdie Hopes, RN Outcome: Progressing 01/19/2023 1925 by Birdie Hopes, RN Outcome: Progressing Goal: Knowledge of patient specific risk factors will improve Elta [place] N/A or DELETE if not current risk factor) 01/20/2023 0808 by Birdie Hopes, RN Outcome: Progressing 01/19/2023 1925 by Birdie Hopes, RN Outcome: Progressing   Problem: Ischemic Stroke/TIA Tissue Perfusion: Goal: Complications of ischemic stroke/TIA will be minimized 01/20/2023 0808 by Birdie Hopes, RN Outcome: Progressing 01/19/2023 1925 by Birdie Hopes, RN Outcome: Progressing   Problem: Coping: Goal: Will verbalize positive feelings about self 01/20/2023 0808 by Birdie Hopes, RN Outcome: Progressing 01/19/2023 1925 by Birdie Hopes, RN Outcome: Progressing Goal: Will identify appropriate support needs 01/20/2023 0808 by Birdie Hopes, RN Outcome: Progressing 01/19/2023 1925 by Birdie Hopes, RN Outcome: Progressing   Problem: Health Behavior/Discharge Planning: Goal: Ability to manage health-related needs will improve 01/20/2023 0808 by Birdie Hopes, RN Outcome: Progressing 01/19/2023 1925 by Birdie Hopes, RN Outcome: Progressing Goal: Goals will be collaboratively established with patient/family 01/20/2023 [phone] by Birdie Hopes, RN Outcome: Progressing 01/19/2023 1925 by Birdie Hopes, RN Outcome: Progressing   Problem: Self-Care: Goal:  Ability to participate in self-care as condition permits will improve 01/20/2023 0808 by Birdie Hopes, RN Outcome: Progressing 01/19/2023 1925 by Birdie Hopes, RN Outcome: Progressing Goal: Verbalization of feelings and concerns over difficulty with self-care will improve 01/20/2023 0808 by Birdie Hopes, RN Outcome: Progressing 01/19/2023 1925 by Birdie Hopes, RN Outcome: Progressing Goal: Ability to communicate needs accurately will improve 01/20/2023 0808 by Birdie Hopes, RN Outcome: Progressing 01/19/2023 1925 by Birdie Hopes, RN Outcome: Progressing   Problem: Nutrition: Goal: Risk of aspiration will decrease 01/20/2023 0808 by Birdie Hopes, RN Outcome: Progressing 01/19/2023 1925 by Birdie Hopes, RN Outcome: Progressing Goal: Dietary intake will improve 01/20/2023 0808 by Birdie Hopes, RN Outcome: Progressing 01/19/2023 1925 by Birdie Hopes, RN Outcome: Progressing

## 2023-01-20 NOTE — Progress Notes (Addendum)
Inpatient Rehabilitation Admissions Coordinator   Patient readmitted to acute hospital from CIR. Was there 1/11 until 1/20. I have discussed case with Dr Ranell Patrick. I have left a voicemail for patient's son, [name], to call me to discuss his rehab venue options moving forward.  Danne Baxter, RN, MSN Rehab Admissions Coordinator [phone] 01/20/2023 12:34 PM

## 2023-01-20 NOTE — Anesthesia Preprocedure Evaluation (Signed)
Anesthesia Evaluation  Patient identified by MRN, date of birth, ID band Patient awake    Reviewed: Allergy & Precautions, NPO status , Patient's Chart, lab work & pertinent test results  History of Anesthesia Complications (+) PONV  Airway Mallampati: II  TM Distance: >3 FB Neck ROM: Full    Dental  (+) Missing, Dental Advisory Given 2 teeth remaining:   Pulmonary COPD, Recent URI  (recent flu), former smoker   breath sounds clear to auscultation       Cardiovascular hypertension, + Peripheral Vascular Disease (AAA s/p repair and stent graft)   Rhythm:Regular Rate:Normal  12/28/2022 ECHO: EF 60-65%, normal LVF, normal RVF, no significant valvular abnormalities   Neuro/Psych 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), Residual Symptoms    GI/Hepatic Neg liver ROS, hiatal hernia, PUD,GERD  Medicated and Controlled,,  Endo/Other  diabetes, Type 2, Oral Hypoglycemic AgentsHypothyroidism    Renal/GU Renal InsufficiencyRenal disease     Musculoskeletal  (+) Arthritis , Rheumatoid disorders,    Abdominal   Peds  Hematology  (+) Blood dyscrasia (Hgb 9.1), anemia   Anesthesia Other Findings   Reproductive/Obstetrics                             Anesthesia Physical Anesthesia Plan  ASA: 3  Anesthesia Plan: MAC   Post-op Pain Management: Minimal or no pain anticipated   Induction: Intravenous  PONV Risk Score and Plan: 2 and Treatment may vary due to age or medical condition  Airway Management Planned: Nasal Cannula  Additional Equipment:   Intra-op Plan:   Post-operative Plan:   Informed Consent: I have reviewed the patients History and Physical, chart, labs and discussed the procedure including the risks, benefits and alternatives for the proposed anesthesia with the patient or authorized representative who has indicated his/her understanding and acceptance.     Dental advisory  given  Plan Discussed with: CRNA and Surgeon  Anesthesia Plan Comments:         Anesthesia Quick Evaluation

## 2023-01-20 NOTE — Transfer of Care (Signed)
Immediate Anesthesia Transfer of Care Note  Patient: [name]  Procedure(s) Performed: ESOPHAGOGASTRODUODENOSCOPY (EGD) WITH PROPOFOL  Patient Location: PACU and Endoscopy Unit  Anesthesia Type:MAC  Level of Consciousness: drowsy and patient cooperative  Airway & Oxygen Therapy: Patient Spontanous Breathing  Post-op Assessment: Report given to RN and Post -op Vital signs reviewed and stable  Post vital signs: Reviewed and stable  Last Vitals:  Vitals Value Taken Time  BP    Temp    Pulse 76 01/20/23 1032  Resp 31 01/20/23 1032  SpO2 100 % 01/20/23 1032  Vitals shown include unvalidated device data.  Last Pain:  Vitals:   01/20/23 0919  TempSrc: Temporal  PainSc: 7          Complications: No notable events documented.

## 2023-01-20 NOTE — Hospital Course (Addendum)
Principal Problem:   Anemia, posthemorrhagic, acute Active Problems:   Benign esophageal stricture   Severe malnutrition (HCC)   PEG (percutaneous endoscopic gastrostomy) status ([place])   Long term current use of anticoagulant therapy   Debility   Advanced care planning/counseling discussion   Protein-calorie malnutrition, severe  Resolved Problems:   ABLA (acute blood loss anemia)   Hematemesis   On mechanically assisted ventilation (HCC)   Acute respiratory failure with hypoxia (HCC)   Acute upper gastrointestinal bleeding  Consults: - GI - ICU - IR  Procedures: - EGD 01/20/23 - PEG tube replacement with 18 Fr. G-tube 01/21/23  Follow-up items: - SNF - Carotid US 3 mo - Prednisone for RA on DC? - ASA 325 to mitigate risk of severe GI bleed? - GJ tube? -f/u appointment with [facility] (or Endocrine?) for hydrocortisone  -orthostatic  Sit: 94/51 Stand: 121/110

## 2023-01-20 NOTE — Interval H&P Note (Signed)
History and Physical Interval Note:  01/20/2023 9:42 AM  [name]  has presented today for surgery, with the diagnosis of Esophageal stricture.  Upper GI bleed..  The various methods of treatment have been discussed with the patient and family. After consideration of risks, benefits and other options for treatment, the patient has consented to  Procedure(s): ESOPHAGOGASTRODUODENOSCOPY (EGD) WITH PROPOFOL (N/A) BALLOON DILATION (N/A) as a surgical intervention.  The patient's history has been reviewed, patient examined, no change in status, stable for surgery.  I have reviewed the patient's chart and labs.  Questions were answered to the patient's satisfaction.     Aylin Rhoads

## 2023-01-20 NOTE — Anesthesia Postprocedure Evaluation (Signed)
Anesthesia Post Note  Patient: [name]  Procedure(s) Performed: ESOPHAGOGASTRODUODENOSCOPY (EGD) WITH PROPOFOL     Patient location during evaluation: PACU Anesthesia Type: MAC Level of consciousness: awake and alert Pain management: pain level controlled Vital Signs Assessment: post-procedure vital signs reviewed and stable Respiratory status: spontaneous breathing, nonlabored ventilation and respiratory function stable Cardiovascular status: blood pressure returned to baseline and stable Postop Assessment: no apparent nausea or vomiting Anesthetic complications: no   No notable events documented.  Last Vitals:  Vitals:   01/20/23 1054 01/20/23 1059  BP: (!) 114/59 113/64  Pulse: 76 79  Resp: (!) 22 (!) 22  Temp:    SpO2: 100% 100%    Last Pain:  Vitals:   01/20/23 1059  TempSrc:   PainSc: 0-No pain                 Lynda Rainwater

## 2023-01-20 NOTE — Anesthesia Procedure Notes (Signed)
Procedure Name: MAC Date/Time: 01/20/2023 10:08 AM  Performed by: Lowella Dell, CRNAPre-anesthesia Checklist: Patient identified, Emergency Drugs available, Suction available, Patient being monitored and Timeout performed Patient Re-evaluated:Patient Re-evaluated prior to induction Oxygen Delivery Method: Nasal cannula Placement Confirmation: positive ETCO2 Dental Injury: Teeth and Oropharynx as per pre-operative assessment

## 2023-01-20 NOTE — IPAL (Signed)
Date carried out: 01/20/2023  Location of the meeting: Phone conference  Member's involved: Physician and Family Member or next of kin  Discussion: We discussed goals of care for [name], specifically whether or not antiplatelet therapy should be continued or not in this patient with history of stroke with ICA stent and DAPT complicated by severe GI bleeding.  Based on son's understanding of his dad's wishes, the risk of recurrent GI bleeding is potentially a more dreaded complication than another stroke. We discussed the increased risk of stroke now that the patient has an ICA stent in place. This patient would prefer to live life outside of a medical institution, at home with his wife. He is more interested in a treatment plan that focuses on promoting quality of life rather than prolonging life at the expense of autonomy and comfort. We did not address code status during this conversation.  Code status:   Code Status: Full Code   Disposition: Continue current acute care  Time spent for the meeting: 10 minutes    Nani Gasser, MD  01/20/2023, 5:16 PM

## 2023-01-20 NOTE — Progress Notes (Signed)
Inpatient Rehabilitation Admissions Coordinator   I met at bedside with patient and also spoke with his son, [name], by phone. We recommend SNF rehab, not return to CIR . Patient not felt able to reach supervision level to be able to return home with his ex wife, [name], after continued CIR re admit. He has been to [facility] prior to this admit, and [name], would like to further discuss rehab options with SW. When I spoke with patient at bedside, he asked " what is my life going to be like, the procedure was not successful". I encouraged him to discuss with family and physicians. I recommend Palliative for [place] discussions . I will alert acute team and TOC. We will sign off.  Danne Baxter, RN, MSN Rehab Admissions Coordinator [phone] 01/20/2023 2:27 PM

## 2023-01-20 NOTE — Progress Notes (Signed)
Physical Therapy Treatment Patient Details Name: [name] MRN: [id number] DOB: 04-05-1946 Today's Date: 01/20/2023   History of Present Illness 77 y.o. male admitted 1/1 after found on floor.  NAT:[id number] foci of restricted diffusion left frontal lobe and left parietal lobe.  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  Pt (+) flu A and possible aspiration PNA. 1/5 code stroke with increased Rt weakness. 1/8 carotid stent in IR. Transfer to Rehab 1/12 and transfer back to Acute 1/20 due to GIB and VDRF.  Extubated 1/21.  New PT eval ordered 1/22.  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 Pt needing minA for bed mobility today, reporting feeling dizzy and noted BP to read 93/63(71) mmHg. Pt agreeable to standing and ambulating to transport chair with PT today, RW and minA to min guard provided for balance and for BP. Pt will continue to benefit from skilled acute PT at this time to progress mobility and address deficits, current discharge plan remains appropriate.     Recommendations for follow up therapy are one component of a multi-disciplinary discharge planning process, led by the attending physician.  Recommendations may be updated based on patient status, additional functional criteria and insurance authorization.  Follow Up Recommendations  Acute inpatient rehab (3hours/day)     Assistance Recommended at Discharge Frequent or constant Supervision/Assistance  Patient can return home with the following A little help with walking and/or transfers;A lot of help with bathing/dressing/bathroom;Assistance with cooking/housework;Direct supervision/assist for medications  management;Assist for transportation;Assistance with feeding   Equipment Recommendations  Rolling walker (2 wheels)    Recommendations for Other Services Rehab consult     Precautions / Restrictions Precautions Precautions: Fall Precaution Comments: JG tube, NPO, hypotension, right femoral IV line (nurse approved getting OOB) Restrictions Weight Bearing Restrictions: No     Mobility  Bed Mobility Overal bed mobility: Needs Assistance Bed Mobility: Supine to Sit     Supine to sit: HOB elevated, Min assist     General bed mobility comments: pt with minA for bed mobility today for balance with supine>sit    Transfers Overall transfer level: Needs assistance Equipment used: Rolling walker (2 wheels) Transfers: Sit to/from Stand, Bed to chair/wheelchair/BSC Sit to Stand: Min assist   Step pivot transfers: Min guard       General transfer comment: minA for sit>stand, min guard for transfer to chair    Ambulation/Gait Ambulation/Gait assistance: Min assist Gait Distance (Feet): 10 Feet Assistive device: Rolling walker (2 wheels) Gait Pattern/deviations: Step-through pattern, Decreased stride length, Trunk flexed, Drifts right/left Gait velocity: decreased     General Gait Details: assist for balance, cueing for upright posture   Stairs             Wheelchair Mobility    Modified Rankin (Stroke Patients Only)       Balance Overall balance assessment: Needs assistance Sitting-balance support: Feet supported, No upper extremity supported Sitting balance-Leahy Scale: Fair Sitting balance - Comments: min to min guard for dynamic sitting for safety as pt leaning right at times stating he wanted to lie back down   Standing balance support: Bilateral upper extremity supported, During functional activity Standing balance-Leahy Scale: Poor Standing balance comment: use of BUE  on RW for balance, minA needed for balance initially                             Cognition Arousal/Alertness: Awake/alert Behavior During Therapy: WFL for tasks assessed/performed Overall Cognitive Status: No family/caregiver present to determine baseline cognitive functioning                                 General Comments: pt is irritable at times, mostly pleasant        Exercises      General Comments General comments (skin integrity, edema, etc.): drop in BP in sitting 93/63(71) mmHg, but reports mild symptoms, monitored      Pertinent Vitals/Pain Pain Assessment Pain Assessment: No/denies pain    Home Living                          Prior Function            PT Goals (current goals can now be found in the care plan section) Acute Rehab PT Goals Patient Stated Goal: I want to get better PT Goal Formulation: With patient Time For Goal Achievement: 01/31/23 Potential to Achieve Goals: Good Progress towards PT goals: Progressing toward goals    Frequency    Min 3X/week      PT Plan Current plan remains appropriate    Co-evaluation              AM-PAC PT "6 Clicks" Mobility   Outcome Measure  Help needed turning from your back to your side while in a flat bed without using bedrails?: A Little Help needed moving from lying on your back to sitting on the side of a flat bed without using bedrails?: A Little Help needed moving to and from a bed to a chair (including a wheelchair)?: A Little Help needed standing up from a chair using your arms (e.g., wheelchair or bedside chair)?: A Little Help needed to walk in hospital room?: A Lot Help needed climbing 3-5 steps with a railing? : A Lot 6 Click Score: 16    End of Session   Activity Tolerance: Patient limited by fatigue Patient left: in chair (with transport staff, pt being taken off unit for procedure) Nurse Communication: Mobility status PT Visit Diagnosis: Unsteadiness on feet (R26.81);Muscle weakness (generalized) (M62.81);Difficulty in  walking, not elsewhere classified (R26.2)     Time: 8264-1583 PT Time Calculation (min) (ACUTE ONLY): 10 min  Charges:  $Therapeutic Activity: 8-22 mins                     Charlynne Cousins, PT DPT Acute Rehabilitation Services Office [phone]    Luvenia Heller 01/20/2023, 1:00 PM

## 2023-01-20 NOTE — Op Note (Signed)
[facility] Patient Name: [name] Procedure Date : 01/20/2023 MRN: [id number] Attending MD: Mauri Pole , MD, [id number] Date of Birth: 1946-12-11 CSN: [id number] Age: 77 Admit Type: Inpatient Procedure:                Upper GI endoscopy Indications:              Recent gastrointestinal bleeding, Hematemesis,                            Suspected upper gastrointestinal bleeding, For                            therapy of esophageal stenosis Providers:                Mauri Pole, MD, Vladimir Crofts, RN, Janee Morn, Technician Referring MD:             Hortencia Conradi. Meier Medicines:                Monitored Anesthesia Care Complications:            No immediate complications. Estimated Blood Loss:     Estimated blood loss was minimal. Procedure:                Pre-Anesthesia Assessment:                           - Prior to the procedure, a History and Physical                            was performed, and patient medications and                            allergies were reviewed. The patient's tolerance of                            previous anesthesia was also reviewed. The risks                            and benefits of the procedure and the sedation                            options and risks were discussed with the patient.                            All questions were answered, and informed consent                            was obtained. Prior Anticoagulants: The patient                            last took Brilinta (ticagrelor) 5 days prior to the  procedure and ASA. ASA Grade Assessment: IV - A                            patient with severe systemic disease that is a                            constant threat to life. After reviewing the risks                            and benefits, the patient was deemed in                            satisfactory condition to undergo the procedure.                            After obtaining informed consent, the endoscope was                            passed under direct vision. Throughout the                            procedure, the patient's blood pressure, pulse, and                            oxygen saturations were monitored continuously. The                            GIF-H190 ([id number]) Olympus endoscope was introduced                            through the mouth, and advanced to the second part                            of duodenum. The upper GI endoscopy was performed                            with difficulty due to stenosis. Adult endoscope                            was withdrawn and replaced with the pediatric                            endoscope. The patient tolerated the procedure well. Scope In: Scope Out: Findings:      A few benign-appearing, intrinsic severe (stenosis; an endoscope cannot       pass) stenoses were found 32 to 35 cm from the incisors. The narrowest       stenosis measured 6 mm (inner diameter) x 3 cm (in length). The stenoses       were traversed after downsizing scope, was able to advance to 38cm but       couldnt advance further or pass the guidewire without resistance to       dilate the stricture. A guide wire could not be passed. Dilation with a  6-7-8 mm x 5.5 cm CRE balloon dilator was attempted but unsuccessful.      LA Grade C (one or more mucosal breaks continuous between tops of 2 or       more mucosal folds, less than 75% circumference) esophagitis with       friable mucosa and bleeding was found 25 to 38 cm from the incisors, no       definite mass lesion.      Procedure was aborted Impression:               - LA Grade C erosive esophagitis with bleeding.                           - Benign-appearing esophageal stenoses. Unable to                            dilate.                           - No specimens collected. Recommendation:           - Continue present medications.                            - Resume anticoagulant at prior dose. Refer to                            managing physician for anticoagulation dose                            managent. At risk for recurrent GI bleed but also                            at risk for thrombo embolic events given h/o stent                            placement and CVA                           - Manage conservatively with monitoring Hgb and                            transfuse as needed                           - Pantorazole IV '40mg'$  BID                           - Carafate suspension through PEG tube every 6 hours                           - PEG site is not wide enough to accomodate even                            the smalled dimension pediatric endoscope (80m),  was placed in Aug 2023 doesnt have a chronic tract                            that will remain open once the 18Fr PEG is removed                           - Supportive care                           - No further GI work up as inpatient, available if                            needed. Please call with any questions                           - Follow up with GI at [facility] for management of                            chronic esophageal stricture Procedure Code(s):        --- Professional ---                           [phone], Esophagogastroduodenoscopy, flexible,                            transoral; diagnostic, including collection of                            specimen(s) by brushing or washing, when performed                            (separate procedure) Diagnosis Code(s):        --- Professional ---                           K20.81, Other esophagitis with bleeding                           K22.2, Esophageal obstruction                           K92.2, Gastrointestinal hemorrhage, unspecified                           K92.0, Hematemesis CPT copyright 2022 [employer] Association. All rights reserved. The codes documented in this  report are preliminary and upon coder review may  be revised to meet current compliance requirements. Mauri Pole, MD 01/20/2023 10:50:26 AM This report has been signed electronically. Number of Addenda: 0

## 2023-01-20 NOTE — Progress Notes (Signed)
Subjective:  Patient states he feels "disappointed" following unsuccessful dilation during EGD today. He denies any bloody stools. No acute events overnight.   Objective:  Vital signs in last 24 hours: Vitals:   01/20/23 1044 01/20/23 1049 01/20/23 1054 01/20/23 1059  BP: 101/65 112/62 (!) 114/59 113/64  Pulse: 77 76 76 79  Resp: (!) 24 (!) 23 (!) 22 (!) 22  Temp:      TempSrc:      SpO2: 99% 100% 100% 100%  Weight:       Weight change:   Intake/Output Summary (Last 24 hours) at 01/20/2023 1348 Last data filed at 01/20/2023 1028 Gross per 24 hour  Intake 500 ml  Output 400 ml  Net 100 ml   Physical Exam General: Chronically-ill appearing, in NAD CV: RRR, no murmurs, rubs or gallops Skin: Warm and dry Neuro: A&Ox4 Psych: Sad affect   Assessment/Plan:  Principal Problem:   GIB (gastrointestinal bleeding) Active Problems:   Benign esophageal stricture   ABLA (acute blood loss anemia)   Hematemesis   Anemia, posthemorrhagic, acute   On mechanically assisted ventilation (HCC)   Acute respiratory failure with hypoxia (HCC)   PEG (percutaneous endoscopic gastrostomy) status ([place])   Long term current use of anticoagulant therapy   Debility   Advanced care planning/counseling discussion   Acute upper gastrointestinal bleeding  [name] is a 77 year old man with history of recent CVA s/p ICA stenting severe esophageal stricture s/p multiple dilations, PEG tube placement in setting of recurrent aspiration pneumonia, rheumatoid arthritis on chronic steroids, ILD, unprovoked DVT/PE, severe protein-calorie malnutrition, 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   Acute blood loss anemia secondary to GI bleed Hemoglobin is stable today at 8.6 with no evidence of further bleeding. EGD today showed  significant stenoses and could not advance guidewire or scope through and balloon dilator was attempted but unsuccessful. GI recommended resuming DAPT, continuing with conservative management and hgb monitoring and transfusions as needed. With patient's concurrent hx of GI bleeding and recent ICA stent, will need to weigh risks vs. benefits of continuing DAPT in the setting of risking another GIB vs. CVA/ICA restenosis. Per discussion with patient's son [name], he expressed a greater desire to avoid further GI bleeding (see Dr. Ronny Flurry goals of care note from today). Plan to consult Neuro regarding risk of restenosis and CVA if patient is not taking DAPT vs other options such as reduced dose or single agent therapy, and will continue to have goals of care discussions with patient and family.  -F/U Neurology recommendations -IV Pantoprazole '40mg'$  BID -Carafate suspension via PEG q6h -Transfuse if Hgb<7  Recent CVA s/p ICA stent DAPT currently held in the setting of recent acute GI bleed. Plan to follow up with Neurology regarding risks vs benefits of DAPT or other anticoagulation options.  -F/U Neurology recommendations  T2DM Glucose has been well-controlled, CBG 126 this morning.  -SSI  Rheumatoid Arthritis -Patient previously on chronic prednisone for RA prior to last admission for CVA, prednisone was restarted during previous admission but held since onset of GI bleed. Per MAR, patient has not had prednisone in over 10 days and BP has remained stable. Plan to continue to hold prednisone this admission.  -Hold prednisone  Protein-calorie malnutrition PEG tube dependent -Restarted tube feeds after EGD  LOS: 5 days   Spero Curb, Medical Student 01/20/2023, 1:48 PM

## 2023-01-21 ENCOUNTER — Inpatient Hospital Stay (HOSPITAL_COMMUNITY): Payer: Medicare Other

## 2023-01-21 DIAGNOSIS — E43 Unspecified severe protein-calorie malnutrition: Secondary | ICD-10-CM | POA: Insufficient documentation

## 2023-01-21 DIAGNOSIS — Z9862 Peripheral vascular angioplasty status: Secondary | ICD-10-CM

## 2023-01-21 HISTORY — PX: IR REPLC GASTRO/COLONIC TUBE PERCUT W/FLUORO: IMG2333

## 2023-01-21 LAB — GLUCOSE, CAPILLARY
Glucose-Capillary: 122 mg/dL — ABNORMAL HIGH (ref 70–99)
Glucose-Capillary: 131 mg/dL — ABNORMAL HIGH (ref 70–99)
Glucose-Capillary: 133 mg/dL — ABNORMAL HIGH (ref 70–99)
Glucose-Capillary: 154 mg/dL — ABNORMAL HIGH (ref 70–99)
Glucose-Capillary: 154 mg/dL — ABNORMAL HIGH (ref 70–99)
Glucose-Capillary: 174 mg/dL — ABNORMAL HIGH (ref 70–99)

## 2023-01-21 LAB — CBC
HCT: 24.9 % — ABNORMAL LOW (ref 39.0–52.0)
Hemoglobin: 8.3 g/dL — ABNORMAL LOW (ref 13.0–17.0)
MCH: 29.9 pg (ref 26.0–34.0)
MCHC: 33.3 g/dL (ref 30.0–36.0)
MCV: 89.6 fL (ref 80.0–100.0)
Platelets: 217 10*3/uL (ref 150–400)
RBC: 2.78 MIL/uL — ABNORMAL LOW (ref 4.22–5.81)
RDW: 16.5 % — ABNORMAL HIGH (ref 11.5–15.5)
WBC: 7 10*3/uL (ref 4.0–10.5)
nRBC: 0 % (ref 0.0–0.2)

## 2023-01-21 LAB — BASIC METABOLIC PANEL
Anion gap: 10 (ref 5–15)
BUN: 23 mg/dL (ref 8–23)
CO2: 23 mmol/L (ref 22–32)
Calcium: 8.4 mg/dL — ABNORMAL LOW (ref 8.9–10.3)
Chloride: 103 mmol/L (ref 98–111)
Creatinine, Ser: 0.91 mg/dL (ref 0.61–1.24)
GFR, Estimated: >60 mL/min (ref >60)
Glucose, Bld: 178 mg/dL — ABNORMAL HIGH (ref 70–99)
Potassium: 3.8 mmol/L (ref 3.5–5.1)
Sodium: 136 mmol/L (ref 135–145)

## 2023-01-21 MED ORDER — IOHEXOL 300 MG/ML  SOLN: 50.0000 mL | Freq: Once | INTRAMUSCULAR | Status: DC | PRN

## 2023-01-21 MED ORDER — LIDOCAINE VISCOUS HCL 2 % MT SOLN
OROMUCOSAL | Status: AC
  Filled 2023-01-21: qty 15

## 2023-01-21 NOTE — Care Management Important Message (Signed)
Important Message  Patient Details  Name: [name] MRN: [id number] Date of Birth: 1946-01-12   Medicare Important Message Given:  Yes     Hannah Beat 01/21/2023, 3:19 PM

## 2023-01-21 NOTE — Procedures (Signed)
  Procedure:  Perc G tube replacement 63F; no 63F GJ in stock we will order one Monday and can be exchanged next week if needed Preprocedure diagnosis: Esophageal stricture  Postprocedure diagnosis: same EBL:    minimal Complications:   none immediate  See full dictation in [employer].  Dillard Cannon MD Main # [phone] Pager  [phone] Mobile [phone]

## 2023-01-21 NOTE — NC FL2 (Signed)
[place] LEVEL OF CARE FORM     IDENTIFICATION  Patient Name: [name] Birthdate: 10/13/1946 Sex: male Admission Date (Current Location): 01/15/2023  [facility] and [place] Number:  Herbalist and Address:  [facility] [place] [place] [address] [place], [place] [address]      Provider Number: [id number]  Attending Physician Name and Address:  Aldine Contes, MD  Relative Name and Phone Number:       Current Level of Care: Hospital Recommended Level of Care: [place] Prior Approval Number:    Date Approved/Denied:   PASRR Number: [id number]  Discharge Plan: SNF    Current Diagnoses: Patient Active Problem List   Diagnosis Date Noted   Protein-calorie malnutrition, severe 01/21/2023   Excessive oral secretions 01/19/2023   PEG (percutaneous endoscopic gastrostomy) status ([place]) 01/17/2023   Long term current use of anticoagulant therapy 01/17/2023   Debility 01/17/2023   Advanced care planning/counseling discussion 01/17/2023   Antiplatelet or antithrombotic long-term use 01/15/2023   Anemia, posthemorrhagic, acute 01/15/2023   Acute ischemic left middle cerebral artery (MCA) stroke ([place]) 01/06/2023   Severe malnutrition ([place]) 12/30/2022   Sepsis without acute organ dysfunction ([place]) 12/28/2022   Influenza A 12/28/2022   Cerebrovascular accident (CVA) ([place]) 12/28/2022   Acute encephalopathy 12/27/2022   Acute esophagitis    Abdominal pain, epigastric    Closed left fibular fracture 08/22/2022   Reflux esophagitis 08/22/2022   Pressure injury of skin 08/20/2022   Upper GI bleed 08/19/2022   UGIB (upper gastrointestinal bleed) 08/18/2022   Pancytopenia ([place])    Immunosuppression ([place])    ILD (interstitial lung disease) ([place])    Diarrhea    DIC (disseminated intravascular coagulation) ([place])    AKI (acute kidney injury) ([place])    COVID-19 01/26/2021   Rheumatoid arthritis ([place]) 07/22/2015    Mucositis due to chemotherapy 06/25/2015   Mucositis due to radiation therapy 06/25/2015   SBO (small bowel obstruction) ([place]) 05/29/2015   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   H/O drug therapy 05/12/2015   Abnormal mental state 04/22/2015   Fast heart beat 04/22/2015   Febrile 04/18/2015   Candida esophagitis ([place]) 04/16/2015   Dysphagia, pharyngoesophageal phase 04/16/2015   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   Fall 05/24/2014   Traumatic pneumothorax 05/23/2014   Diabetes mellitus, type 2 ([place]) 05/01/2014   Perirectal abscess 07/31/2013   Hemorrhoids 07/31/2013   Lentigines 11/13/2012   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   Atypical nevus 08/29/2012   Malignant melanoma in situ ([place]) 08/29/2012   Polypharmacy 08/23/2012   Dyspnea 08/14/2012   Rheumatoid arthritis(714.0) 08/14/2012   HTN (hypertension) 08/14/2012   GERD (gastroesophageal reflux disease) 08/14/2012   Emphysema 08/14/2012   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   Duodenal ulcer with hemorrhage 10/15/2011   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   Benign esophageal stricture 10/15/2011    Orientation RESPIRATION BLADDER Height & Weight     Self, Time, Situation, Place  Normal Continent Weight: 166 lb 3.6 oz (75.4 kg) Height:     BEHAVIORAL SYMPTOMS/MOOD NEUROLOGICAL BOWEL NUTRITION STATUS      Continent Feeding tube (OSMOLITE (1.5 CAL) liquid 1,000 mL)  AMBULATORY STATUS COMMUNICATION OF NEEDS Skin   Limited Assist Verbally Normal                       Personal Care Assistance Level of Assistance  Bathing, Feeding, Dressing Bathing Assistance: Limited  assistance Feeding assistance: Independent Dressing Assistance: Limited assistance     Functional Limitations Info  Sight, Hearing Sight Info: Impaired Hearing Info: Impaired      SPECIAL CARE FACTORS FREQUENCY  PT (By licensed PT), OT (By licensed OT)     PT Frequency: 5x/week OT Frequency:  5x/week            Contractures Contractures Info: Not present    Additional Factors Info  Code Status, Allergies Code Status Info: Full Allergies Info: NKA           Current Medications (01/21/2023):  This is the current hospital active medication list Current Facility-Administered Medications  Medication Dose Route Frequency Provider Last Rate Last Admin   Chlorhexidine Gluconate Cloth 2 % PADS 6 each  6 each Topical Daily Maryjane Hurter, MD   6 each at 01/19/23 0934   feeding supplement (OSMOLITE 1.5 CAL) liquid 1,000 mL  1,000 mL Per Tube Continuous Nani Gasser, MD 65 mL/hr at 01/20/23 1731 New Bag at 01/20/23 1731   ondansetron (ZOFRAN) injection 4 mg  4 mg Intravenous Q6H PRN Cristal Generous, NP   4 mg at 01/16/23 0930   Oral care mouth rinse  15 mL Mouth Rinse PRN Noemi Chapel P, DO       pantoprazole (PROTONIX) injection 40 mg  40 mg Intravenous Q12H Yetta Flock, MD   40 mg at 01/20/23 2051   scopolamine (TRANSDERM-SCOP) 1 MG/3DAYS 1.5 mg  1 patch Transdermal Q72H Noemi Chapel P, DO       senna-docusate (Senokot-S) tablet 1 tablet  1 tablet Oral QHS Simonne Maffucci B, MD   1 tablet at 01/20/23 2051   sucralfate (CARAFATE) 1 GM/10ML suspension 1 g  1 g Oral Q6H Atway, Rayann N, DO   1 g at 01/21/23 3818     Discharge Medications: Please see discharge summary for a list of discharge medications.  Relevant Imaging Results:  Relevant Lab Results:   Additional Information ss#: [id number]  Benard Halsted, LCSW

## 2023-01-21 NOTE — Progress Notes (Signed)
Interval history PEG tube dislodged.  Initially replaced with confirmation by contrast KUB.  Dislodged again this morning and fell on the floor of the patient's bathroom.  Otherwise the patient has no new concerns.  He reports that preventing another stroke is very important to him and that he would tolerate bleeding if it were to occur as a side effect from antiplatelet therapy.  Physical exam Blood pressure (!) 99/59, pulse 76, temperature (!) 97.5 F (36.4 C), temperature source Oral, resp. rate (!) 25, weight 75.4 kg, SpO2 100 %.  Comfortable appearing Heart rate and rhythm are regular Breathing is regular and unlabored Abdomen with left upper quadrant gastrotomy without PEG tube Skin is warm and dry [place], mood and affect are concordant  Weight change: 0.3 kg   Intake/Output Summary (Last 24 hours) at 01/21/2023 0801 Last data filed at 01/21/2023 0600 Gross per 24 hour  Intake 500 ml  Output 350 ml  Net 150 ml    Labs Hemoglobin 8.3 and stable  Assessment and plan Hospital day 6  [name] is a 77 y.o. admitted for severe upper GI bleed after initiation of antiplatelet therapy for stroke and ICA stent.  Hospital course complicated by esophageal stenosis.  Principal Problem:   Anemia, posthemorrhagic, acute Active Problems:   Benign esophageal stricture   Severe malnutrition (HCC)   PEG (percutaneous endoscopic gastrostomy) status ([place])   Long term current use of anticoagulant therapy   Debility   Advanced care planning/counseling discussion  Acute blood loss anemia, improving Due to severe upper GI bleed with melena and bleeding from PEG tube.  Status post transfer to the floor from the ICU.  Unfortunately, due to esophageal stenosis, EGD evaluation is limited to the esophagus, which was notable for some bleeding esophagitis, but not likely the source of this patient's severe bleeding.  Appreciate GI assistance with this case. He is at  high risk for recurrent GI bleeding. - Pantoprazole 40 mg IV twice daily - Sucralfate via PEG every 6 hours  History of recent CVA status post left ICA stent Stent placed 01/04/2023 for left MCA territory infarct and symptomatic left ICA stenosis with ulcerated plaque.  Was started on DAPT but suffered a severe upper GI bleed requiring admission to the ICU and 3 transfusions of PRBCs.  Clarified goals of care with this patient.  He is willing to tolerate increased risk of GI bleeding to decrease risk of recurrent stroke. As he has had several days off antiplatelets, we will evaluate whether re-stenosis has occurred prior to restarting antiplatelet. If resuming, consider aspirin 325 mg to balance risk of recurrent bleeding and risk of recurrent stroke. - Left carotid ultrasound  Nutrition Status: Nutrition Problem: Severe Malnutrition Etiology: chronic illness Signs/Symptoms: severe muscle depletion, severe fat depletion Interventions: Refer to RD note for recommendations  PEG tube dependence Dislodged completely and needs to be replaced.  From chart review the current tube is an 33 French gastrojejunostomy tube that was placed by [facility] interventional radiology on 09/15/2022. - Appreciate IR assistance with this case  Diet: No route by which to feed, currently IVF: None VTE: None in setting of GI bleeding acute blood loss anemia Code: Full PT/OT recommendations: CIR TOC recommendations: Pending  Discharge plan: Likely SNF  Nani Gasser MD 01/21/2023, 8:01 AM  Pager: [phone] After 5pm on weekdays and 1pm on weekends: [phone]

## 2023-01-21 NOTE — Progress Notes (Signed)
Occupational Therapy Treatment Patient Details Name: [name] MRN: [id number] DOB: [birth year] Today's Date: 01/21/2023   History of present illness 78 y.o. male admitted 1/1 after found on floor.  [id number] foci of restricted diffusion left frontal lobe and left parietal lobe.  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  Pt (+) flu A and possible aspiration PNA. 1/5 code stroke with increased Rt weakness. 1/8 carotid stent in IR. Transfer to Rehab 1/12 and transfer back to Acute 1/20 due to GIB and VDRF.  Extubated 1/21.  New PT eval ordered 1/22.  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  Somewhat upset, as the patient's feeding tube was dislodged sometime over the past evening.  Patient assisted back to the bed with essentially Mod A to stand due to poor positioning in recliner, and Min A once standing.  Patient declining any in room mobility, perseverating on MD coming up and fixing his tube.  OT can continue efforts in the acute setting to address deficits, and assist with the transition to the next level of care.       Recommendations for follow up therapy are one component of a multi-disciplinary discharge planning process, led by the attending physician.  Recommendations may be updated based on patient status, additional functional criteria and insurance authorization.    Follow Up Recommendations  Acute inpatient rehab (3hours/day)     Assistance Recommended at Discharge Frequent or constant Supervision/Assistance  Patient can return home with the following  A lot of help with walking and/or transfers;A lot of help with bathing/dressing/bathroom;Assistance with cooking/housework;Direct supervision/assist for medications  management;Direct supervision/assist for financial management;Assist for transportation;Help with stairs or ramp for entrance   Equipment Recommendations  None recommended by OT    Recommendations for Other Services      Precautions / Restrictions Precautions Precautions: Fall Restrictions Weight Bearing Restrictions: No       Mobility Bed Mobility Overal bed mobility: Needs Assistance Bed Mobility: Sit to Supine       Sit to supine: HOB elevated, Supervision        Transfers Overall transfer level: Needs assistance Equipment used: 1 person hand held assist   Sit to Stand: Min assist     Step pivot transfers: Min guard, Min assist           Balance Overall balance assessment: Needs assistance Sitting-balance support: Feet supported, No upper extremity supported Sitting balance-Leahy Scale: Fair     Standing balance support: Bilateral upper extremity supported, During functional activity, Single extremity supported Standing balance-Leahy Scale: Poor                             ADL either performed or assessed with clinical judgement   ADL                                              Extremity/Trunk Assessment Upper Extremity Assessment RUE Deficits / Details: pt maintains hand and digit flexion with very limited strength into extension RUE Sensation: WNL RUE Coordination: decreased fine motor   Lower Extremity Assessment Lower Extremity Assessment: Defer to PT evaluation  Cervical / Trunk Assessment Cervical / Trunk Assessment: Kyphotic    Vision   Vision Assessment?: No apparent visual deficits   Perception     Praxis      Cognition Arousal/Alertness: Awake/alert Behavior During Therapy: Restless Overall Cognitive Status: No family/caregiver present to determine baseline cognitive functioning                                           Pertinent Vitals/ Pain       Pain Assessment Pain  Assessment: No/denies pain Pain Intervention(s): Monitored during session                                                          Frequency  Min 2X/week        Progress Toward Goals  OT Goals(current goals can now be found in the care plan section)  Progress towards OT goals: Progressing toward goals  Acute Rehab OT Goals OT Goal Formulation: With patient Time For Goal Achievement: 02/01/23 Potential to Achieve Goals: [place] Discharge plan remains appropriate    Co-evaluation                 AM-PAC OT "6 Clicks" Daily Activity     Outcome Measure   Help from another person eating meals?: A Little Help from another person taking care of personal grooming?: A Little Help from another person toileting, which includes using toliet, bedpan, or urinal?: A Little Help from another person bathing (including washing, rinsing, drying)?: A Little Help from another person to put on and taking off regular upper body clothing?: A Little Help from another person to put on and taking off regular lower body clothing?: A Little 6 Click Score: 18    End of Session Equipment Utilized During Treatment: Gait belt  OT Visit Diagnosis: Unsteadiness on feet (R26.81);Muscle weakness (generalized) (M62.81)   Activity Tolerance Patient tolerated treatment well   Patient Left in bed;with call bell/phone within reach;with bed alarm set   Nurse Communication Mobility status        Time: 5409-8119 OT Time Calculation (min): 14 min  Charges: OT General Charges $OT Visit: 1 Visit OT Treatments $Self Care/Home Management : 8-22 mins  01/21/2023  RP, OTR/L  Acute Rehabilitation Services  Office:  [phone]   Metta Clines 01/21/2023, 3:19 PM

## 2023-01-21 NOTE — Progress Notes (Signed)
Mobility Specialist Progress Note   01/21/23 1215  Mobility  Activity Ambulated with assistance in room;Transferred from bed to chair  Level of Assistance Contact guard assist, steadying assist  Assistive Device Front wheel walker  Distance Ambulated (ft) 20 ft  Range of Motion/Exercises Active;All extremities  Activity Response Tolerated fair;RN notified   Post Ambulation: BP 85/47 5 min Post Ambulation: BP 103/76  Patient received in supine and agreeable to participate despite not feeling well. Required minimal HHA for bed mobility and stood with supervision. Ambulated in room to recliner chair with min guard for safety and with slow steady gait. C/o dizziness upon sitting in recliner chair and was found to be hypotensive. Symptoms improved and BP increased with seated rest. Was left in recliner with all needs met, RN present.  [place] Detrell Umscheid, BS EXP Mobility Specialist Please contact via SecureChat or Rehab office at [phone]

## 2023-01-21 NOTE — Progress Notes (Addendum)
Secure sent by RN as patient's G tube became dislodged. RN replaced tube into stoma. RN states that internal balloon was not inflated.   Patient assessed at bedside. He reported mild abdominal pain at site from tube being displaced.  P: KUB w/ contrast showed G-tube connected to gastric lumen with no extraluminal extravagation of contrast. Displacement occurred in setting of balloon not being inflated fully. On chart review gtube placed 9/23 and note from [facility] mentions 10cc being used to inflate balloon. Patient is not interested in having this done at this time.

## 2023-01-21 NOTE — TOC Initial Note (Signed)
Transition of Care [facility]) - Initial/Assessment Note    Patient Details  Name: [name] MRN: [id number] Date of Birth: 1946-01-29  Transition of [facility]) CM/SW Contact:    Benard Halsted, LCSW Phone Number: 01/21/2023, 9:15 AM  Clinical Narrative:                 CSW received consult for possible SNF placement at time of discharge. CSW spoke with patient's son. He reported that patient's family is currently unable to care for patient at their home given patient's current physical needs and fall risk. He expressed understanding of PT recommendation and is agreeable to SNF placement at time of discharge since [facility] is unable to accept him back. He reports preference for [facility] since they are familiar with patient. CSW discussed that patient has used about 75 of his 100 Medicare days and thus will become private pay after those days are used. He reported understanding and hopes that is all patient will require. He is aware of Medicaid process in the event that is needed in the future. CSW requested [facility] review referral; they do not have beds until Monday if they can accept patient.   Skilled Nursing Rehab Facilities-   [email or web address]   Ratings out of 5 stars (5 the highest)   Name Address  Phone # [place] Inspection Overall  [facility] [address] [place] '4 5 2 3  '$ [facility]  [address] [place], [place] [phone] '4 2 5 5  '$ [facility] [place], [place] '1 3 1 1  '$ [place] [place], [place] '2 2 4 4  '$ [facility] [address], [place] '2 1 2 1  '$ [place] [address] [place] [phone] '3 3 4 4  '$ [facility] [address], [place] '4 1 3 2  '$ [facility] [address], [place] '4 1 3 2  '$ [address] ([place]) [place],  [place] [phone] '3 1 2 1  '$ [facility] [address], [place] [phone] '3 1 1 1  '$ [facility] [address], [facility] [phone] '3 2 2 2  '$ [facility]) [place]. [place], [place] [phone] '3 1 1 1  '$ Dustin Flock [address] [place] [phone] '4 2 4 4          '$ [facility] [address] [place]      [facility] [place] '4 1 3 2  '$ [facility] [place] [address] [place] '3 1 5 4  '$ [facility], [place] [address], [place] [phone] '1 1 2 1  '$ [facility] [address], [facility] [phone] '2 2 4 4          '$ [address] (no [facility]) [place] [facility], [place] [phone] '5 5 5 5  '$ [facility] (No Humana) [address], [place] '4 1 4 3  '$ [facility] (No UHC) [place], [place] '5 5 5 5  '$ [facility] [address] [facility] [phone] '2 3 5 5  '$ [facility] [address] [place] '1 1 2 1  '$ [facility] [address] '3 1 1 1  '$ [place], [place] '5 2 5 5  '$ [facility]  [address] [place] '2 2 1 '$ 1  [facility] [address], [place] '3 2 1 1  '$ [facility] [place], [place] '2 2 2 2          '$ [facility] [address] [place] [phone] '1 1 1 1  '$ [facility] [address] [place]  [phone] '2 4 3 3  '$ [facility] [place] [address], [place] [phone] '3 2 3 3  '$ [place] [address] [place] '2 1 1 1  '$ [place] (No Humana) [address], [place] [phone] '2 2 3 3  '$  [facility]) [address], [place] [phone] '2 1 1 1          '$ [facility] [place], [facility] '5 4 5 5  '$ [facility]  [facility])  [address], [place] '2 1 2 1  '$ [facility]) [place] [address], [place] '3 1 4 3  '$ [facility] [address], [place] '3 3 4 4  '$ [address] [place], [place] '2 3 1 1  '$ [facility]) [address] [place] [phone] '2 1 4 3     '$ Expected Discharge Plan: [place] Barriers to Discharge: SNF Pending bed offer, Continued Medical Work up   Patient Goals and CMS Choice Patient states their goals for this hospitalization and ongoing recovery are:: Rehab CMS Medicare.gov Compare Post Acute Care list provided to:: Patient Represenative (must comment) Choice offered to / list presented to : Adult Children, Patient [place] ownership interest in [facility].provided to:: Adult Children    Expected Discharge Plan and Services In-house Referral: Clinical Social Work   Post Acute Care Choice: [place] Living arrangements for the past 2 months: Apartment                                      Prior Living Arrangements/Services Living arrangements for the past 2 months: Apartment Lives with:: Self Patient language and need for interpreter reviewed:: Yes Do you feel safe going back to the place where you live?: Yes      Need for Family Participation in Patient Care: Yes (Comment) Care giver support system in place?: Yes (comment) Current home services: DME Criminal Activity/Legal Involvement Pertinent to Current Situation/Hospitalization: No - Comment as needed  Activities of Daily Living      Permission Sought/Granted Permission sought to share information with : Facility Sport and exercise psychologist, Family Supports Permission granted to share information with : Yes, Verbal Permission Granted  Share Information with NAME: [name]  Permission granted to share info w AGENCY: SNFs  Permission granted to share  info w Relationship: Son  Permission granted to share info w Contact Information: [phone]  Emotional Assessment Appearance:: Appears stated age Attitude/Demeanor/Rapport: Engaged Affect (typically observed): Accepting, Appropriate Orientation: : Oriented to Self, Oriented to Place, Oriented to  Time, Oriented to Situation Alcohol / Substance Use: Not Applicable Psych Involvement: No (comment)  Admission diagnosis:  GIB (gastrointestinal bleeding) [K92.2] Patient Active Problem List   Diagnosis Date Noted   Protein-calorie malnutrition, severe 01/21/2023   Excessive oral secretions 01/19/2023   PEG (percutaneous endoscopic gastrostomy) status ([place]) 01/17/2023   Long term current use of anticoagulant therapy 01/17/2023   Debility 01/17/2023   Advanced care planning/counseling discussion 01/17/2023   Antiplatelet or antithrombotic long-term use 01/15/2023  Anemia, posthemorrhagic, acute 01/15/2023   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/06/2023   Severe malnutrition ([place]) 12/30/2022   Sepsis without acute organ dysfunction ([place]) 12/28/2022   Influenza A 12/28/2022   Cerebrovascular accident (CVA) ([place]) 12/28/2022   Acute encephalopathy 12/27/2022   Acute esophagitis    Abdominal pain, epigastric    Closed left fibular fracture 08/22/2022   Reflux esophagitis 08/22/2022   Pressure injury of skin 08/20/2022   Upper GI bleed 08/19/2022   UGIB (upper gastrointestinal bleed) 08/18/2022   Pancytopenia ([place])    Immunosuppression ([place])    ILD (interstitial lung disease) ([place])    Diarrhea    DIC (disseminated intravascular coagulation) ([place])    AKI (acute kidney injury) ([place])    COVID-19 01/26/2021   Rheumatoid arthritis ([place]) 07/22/2015   Mucositis due to chemotherapy 06/25/2015   Mucositis due to radiation therapy 06/25/2015   SBO (small bowel obstruction) ([place]) 05/29/2015   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   H/O drug therapy 05/12/2015   Abnormal  mental state 04/22/2015   Fast heart beat 04/22/2015   Febrile 04/18/2015   Candida esophagitis ([place]) 04/16/2015   Dysphagia, pharyngoesophageal phase 04/16/2015   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   Fall 05/24/2014   Traumatic pneumothorax 05/23/2014   Diabetes mellitus, type 2 ([place]) 05/01/2014   Perirectal abscess 07/31/2013   Hemorrhoids 07/31/2013   Lentigines 11/13/2012   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   Atypical nevus 08/29/2012   Malignant melanoma in situ ([place]) 08/29/2012   Polypharmacy 08/23/2012   Dyspnea 08/14/2012   Rheumatoid arthritis(714.0) 08/14/2012   HTN (hypertension) 08/14/2012   GERD (gastroesophageal reflux disease) 08/14/2012   Emphysema 08/14/2012   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 [place] [address] Phone: [phone] Fax: [phone]  [facility] [id number] - [place], [place] - [address] [place] [address] Phone: [phone] Fax: [phone]  [facility] [address] [place] [address] Phone: [phone] Fax: [phone]     Social Determinants of Health (SDOH) Social History: [place]: No Food Insecurity (12/28/2022)  Housing: Low Risk  (12/28/2022)  Transportation Needs: No Transportation  Needs (12/28/2022)  Utilities: Not At Risk (12/28/2022)  Tobacco Use: Medium Risk (01/20/2023)   SDOH Interventions:     Readmission Risk Interventions    01/21/2023    9:13 AM  Readmission Risk Prevention Plan  Transportation Screening Complete  Medication Review (RN Care Manager) Complete  PCP or Specialist appointment within 3-5 days of discharge Complete  HRI or Home Care Consult Complete  SW Recovery Care/Counseling Consult Complete  Palliative Care Screening Not [place] Complete

## 2023-01-22 ENCOUNTER — Inpatient Hospital Stay (HOSPITAL_COMMUNITY): Payer: Medicare Other

## 2023-01-22 DIAGNOSIS — Z955 Presence of coronary angioplasty implant and graft: Secondary | ICD-10-CM

## 2023-01-22 LAB — CBC
HCT: 27.4 % — ABNORMAL LOW (ref 39.0–52.0)
Hemoglobin: 8.7 g/dL — ABNORMAL LOW (ref 13.0–17.0)
MCH: 28.6 pg (ref 26.0–34.0)
MCHC: 31.8 g/dL (ref 30.0–36.0)
MCV: 90.1 fL (ref 80.0–100.0)
Platelets: 228 10*3/uL (ref 150–400)
RBC: 3.04 MIL/uL — ABNORMAL LOW (ref 4.22–5.81)
RDW: 16.5 % — ABNORMAL HIGH (ref 11.5–15.5)
WBC: 6.1 10*3/uL (ref 4.0–10.5)
nRBC: 0 % (ref 0.0–0.2)

## 2023-01-22 LAB — IRON AND TIBC
Iron: 17 ug/dL — ABNORMAL LOW (ref 45–182)
Saturation Ratios: 11 % — ABNORMAL LOW (ref 17.9–39.5)
TIBC: 160 ug/dL — ABNORMAL LOW (ref 250–450)
UIBC: 143 ug/dL

## 2023-01-22 LAB — GLUCOSE, CAPILLARY
Glucose-Capillary: 180 mg/dL — ABNORMAL HIGH (ref 70–99)
Glucose-Capillary: 192 mg/dL — ABNORMAL HIGH (ref 70–99)
Glucose-Capillary: 133 mg/dL — ABNORMAL HIGH (ref 70–99)
Glucose-Capillary: 167 mg/dL — ABNORMAL HIGH (ref 70–99)

## 2023-01-22 LAB — FERRITIN: Ferritin: 279 ng/mL (ref 24–336)

## 2023-01-22 MED ORDER — ASPIRIN 325 MG PO TABS
325.0000 mg | ORAL_TABLET | Freq: Every day | ORAL | Status: DC
  Administered 2023-01-22 – 2023-01-23 (×2): 325 mg via ORAL
  Filled 2023-01-22 (×2): qty 1

## 2023-01-22 MED ORDER — OSMOLITE 1.5 CAL PO LIQD
1000.0000 mL | ORAL | Status: AC
  Administered 2023-01-22 (×2): 1000 mL
  Filled 2023-01-22 (×3): qty 1000

## 2023-01-22 NOTE — Progress Notes (Signed)
Interval history Feeling better this morning. No more signs of bleeding. Preventing recurrent stroke is a very high priority for this patient.  Physical exam Blood pressure (!) 119/59, pulse 80, temperature 99.1 F (37.3 C), resp. rate 20, weight 70.3 kg, SpO2 98 %.  Comfortable appearing Extremities are warm and well perfused Breathing is regular and unlabored Abdomen with PEG tube in place LUQ Skin is warm and dry Pleasant, mood and affect concordant  Intake/Output Summary (Last 24 hours) at 01/22/2023 0647 Last data filed at 01/22/2023 0300 Gross per 24 hour  Intake 0 ml  Output 600 ml  Net -600 ml   Labs Hemoglobin 8.4 stable  Images and other studies Left carotid ultrasound pending  Assessment and plan Hospital day 7  [name] is a 77 y.o. with history of esophageal stenosis and CVA status post left ICA stenting admitted for severe upper GI bleeding and hemorrhagic shock after initiation of DAPT. Transferred from ICU and stable on progressive floor.  Principal Problem:   Anemia, posthemorrhagic, acute Active Problems:   Benign esophageal stricture   Severe malnutrition (HCC)   PEG (percutaneous endoscopic gastrostomy) status ([place])   Long term current use of anticoagulant therapy   Debility   Advanced care planning/counseling discussion   Protein-calorie malnutrition, severe  Acute blood loss anemia, improving Due to severe upper GI bleed with melena and bleeding from PEG tube.  Status post transfer to the floor from the ICU.  Unfortunately, due to esophageal stenosis, EGD evaluation is limited to the esophagus, which was notable for some bleeding esophagitis, but not likely the source of this patient's severe bleeding.  Appreciate GI assistance with this case. He is at high risk for recurrent GI bleeding. Iron studies today to evaluate degree of iron deficiency and indication for IV iron while hospitalized. - Pantoprazole 40 mg IV twice  daily - Sucralfate via PEG every 6 hours   History of recent CVA status post left ICA stent Stent placed 01/04/2023 for left MCA territory infarct and symptomatic left ICA stenosis with ulcerated plaque.  Was started on DAPT but suffered a severe upper GI bleed requiring admission to the ICU and 3 transfusions of PRBCs.  Clarified goals of care with this patient.  He is willing to tolerate increased risk of GI bleeding to decrease risk of recurrent stroke. As he has had several days off antiplatelets, we will evaluate whether re-stenosis has occurred prior to restarting antiplatelet. If resuming, consider aspirin 325 mg to balance risk of recurrent bleeding and risk of recurrent stroke. - Left carotid ultrasound pending   Nutrition Status: Nutrition Problem: Severe Malnutrition Etiology: chronic illness Signs/Symptoms: severe muscle depletion, severe fat depletion Interventions: Refer to RD note for recommendations  PEG tube dependence Esophageal stenosis PEG replaced 01/21/23 with 18 Fr. G-tube. TF at rate of 65 mL/h. - Scopolamine to reduce burden of oral secretions that have to pass through stenosed esophagus - Appreciate IR assistance with this case  Diet: TF 65 mL/h IVF: None VTE: None due to risk of GI bleeding Code: Full PT/OT recommendations: SNF TOC recommendations: Pending Family Update: By phone  Discharge plan: Pending   Dennis Gasser MD 01/22/2023, 6:47 AM  Pager: [phone] After 5pm on weekdays and 1pm on weekends: [phone]

## 2023-01-22 NOTE — Progress Notes (Signed)
Carotid duplex has been completed.   Preliminary results in CV Proc.   [name] 01/22/2023 11:28 AM

## 2023-01-22 NOTE — Plan of Care (Signed)
  Problem: Health Behavior/Discharge Planning: Goal: Ability to manage health-related needs will improve Outcome: Progressing   Problem: Clinical Measurements: Goal: Ability to maintain clinical measurements within normal limits will improve Outcome: Progressing   

## 2023-01-23 DIAGNOSIS — I6320 Cerebral infarction due to unspecified occlusion or stenosis of unspecified precerebral arteries: Secondary | ICD-10-CM

## 2023-01-23 DIAGNOSIS — Z87891 Personal history of nicotine dependence: Secondary | ICD-10-CM

## 2023-01-23 LAB — CBC
HCT: 25.5 % — ABNORMAL LOW (ref 39.0–52.0)
Hemoglobin: 8.3 g/dL — ABNORMAL LOW (ref 13.0–17.0)
MCH: 29.1 pg (ref 26.0–34.0)
MCHC: 32.5 g/dL (ref 30.0–36.0)
MCV: 89.5 fL (ref 80.0–100.0)
Platelets: 221 10*3/uL (ref 150–400)
RBC: 2.85 MIL/uL — ABNORMAL LOW (ref 4.22–5.81)
RDW: 16 % — ABNORMAL HIGH (ref 11.5–15.5)
WBC: 6.5 10*3/uL (ref 4.0–10.5)
nRBC: 0 % (ref 0.0–0.2)

## 2023-01-23 LAB — GLUCOSE, CAPILLARY
Glucose-Capillary: 145 mg/dL — ABNORMAL HIGH (ref 70–99)
Glucose-Capillary: 168 mg/dL — ABNORMAL HIGH (ref 70–99)
Glucose-Capillary: 170 mg/dL — ABNORMAL HIGH (ref 70–99)
Glucose-Capillary: 175 mg/dL — ABNORMAL HIGH (ref 70–99)
Glucose-Capillary: 181 mg/dL — ABNORMAL HIGH (ref 70–99)
Glucose-Capillary: 184 mg/dL — ABNORMAL HIGH (ref 70–99)
Glucose-Capillary: 212 mg/dL — ABNORMAL HIGH (ref 70–99)

## 2023-01-23 MED ORDER — SODIUM CHLORIDE 0.9 % IV SOLN
250.0000 mg | Freq: Every day | INTRAVENOUS | Status: AC
  Administered 2023-01-23 – 2023-01-24 (×2): 250 mg via INTRAVENOUS
  Filled 2023-01-23 (×2): qty 20

## 2023-01-23 MED ORDER — SENNOSIDES-DOCUSATE SODIUM 8.6-50 MG PO TABS
1.0000 | ORAL_TABLET | Freq: Every day | ORAL | Status: DC
  Administered 2023-01-23: 1
  Filled 2023-01-23 (×3): qty 1

## 2023-01-23 MED ORDER — ASPIRIN 325 MG PO TABS
325.0000 mg | ORAL_TABLET | Freq: Every day | ORAL | Status: DC
  Administered 2023-01-24 – 2023-01-28 (×5): 325 mg
  Filled 2023-01-23 (×5): qty 1

## 2023-01-23 MED ORDER — SUCRALFATE 1 GM/10ML PO SUSP
1.0000 g | Freq: Four times a day (QID) | ORAL | Status: DC
  Administered 2023-01-23 – 2023-01-28 (×17): 1 g
  Filled 2023-01-23 (×16): qty 10

## 2023-01-23 NOTE — Plan of Care (Signed)
  Problem: Health Behavior/Discharge Planning: Goal: Ability to manage health-related needs will improve Outcome: Progressing   Problem: Clinical Measurements: Goal: Ability to maintain clinical measurements within normal limits will improve Outcome: Progressing   Problem: Nutrition: Goal: Adequate nutrition will be maintained Outcome: Progressing   Problem: Pain Managment: Goal: General experience of comfort will improve Outcome: Progressing

## 2023-01-23 NOTE — TOC Progression Note (Signed)
Transition of Care [facility]) - Progression Note    Patient Details  Name: [name] MRN: [id number] Date of Birth: [birth year]  Transition [facility]) CM/SW Contact  [address], [place], [place] Phone Number: 01/23/2023, 9:37 AM  Clinical Narrative:    Phone call to [facility] coordinator for [facility] to discuss bed availability. Left voicemail requesting  a return call.   Tommy Goostree, LCSW Transition of Care      Expected Discharge Plan: Skilled Nursing Facility Barriers to Discharge: SNF Pending bed offer, Continued Medical Work up  Expected Discharge Plan and Services In-house Referral: Clinical Social Work   Post Acute Care Choice: [place] Living arrangements for the past 2 months: Apartment                                       Social Determinants of Health (SDOH) Interventions [place]: No Food Insecurity (12/28/2022)  Housing: Low Risk  (12/28/2022)  Transportation Needs: No Transportation Needs (12/28/2022)  Utilities: Not At Risk (12/28/2022)  Tobacco Use: Medium Risk (01/20/2023)    Readmission Risk Interventions    01/21/2023    9:13 AM  Readmission Risk Prevention Plan  Transportation Screening Complete  Medication Review (RN Care Manager) Complete  PCP or Specialist appointment within 3-5 days of discharge Complete  HRI or [place] Complete  SW Recovery Care/Counseling Consult Complete  Palliative Care Screening Not [place] Complete

## 2023-01-23 NOTE — Progress Notes (Signed)
Subjective:  No acute events overnight, patient is resting comfortably in bed this morning. States he would like to leave the hospital, would prefer [facility] where he has been before.   Objective:  Vital signs in last 24 hours: Vitals:   01/23/23 0500 01/23/23 0534 01/23/23 0600 01/23/23 0800  BP:  113/61  (!) 118/53  Pulse:  79  79  Resp:  (!) 21 15 (!) 22  Temp:  97.8 F (36.6 C)  98.4 F (36.9 C)  TempSrc:  Oral  Oral  SpO2:    100%  Weight: 70.3 kg      Weight change: 0 kg  Intake/Output Summary (Last 24 hours) at 01/23/2023 1025 Last data filed at 01/23/2023 0826 Gross per 24 hour  Intake 5378.75 ml  Output 600 ml  Net 4778.75 ml   CBC    Component Value Date/Time   WBC 6.5 01/23/2023 0217   RBC 2.85 (L) 01/23/2023 0217   HGB 8.3 (L) 01/23/2023 0217   HGB 13.7 11/20/2014 1140   HCT 25.5 (L) 01/23/2023 0217   HCT 41.8 11/20/2014 1140   PLT 221 01/23/2023 0217   PLT 172 11/20/2014 1140   MCV 89.5 01/23/2023 0217   MCV 100 11/20/2014 1140   MCH 29.1 01/23/2023 0217   MCHC 32.5 01/23/2023 0217   RDW 16.0 (H) 01/23/2023 0217   RDW 15.0 (H) 11/20/2014 1140   LYMPHSABS 0.8 01/07/2023 0608   LYMPHSABS 1.0 11/20/2014 1140   MONOABS 1.1 (H) 01/07/2023 0608   MONOABS 0.5 11/20/2014 1140   EOSABS 0.3 01/07/2023 0608   EOSABS 0.0 11/20/2014 1140   BASOSABS 0.0 01/07/2023 0608   BASOSABS 0.1 11/20/2014 1140    Physical Exam General: Alert, chronically-ill appearing. In NAD.  CV: RRR, no m/r/g Pulm: Normal respiratory effort Abd: PEG tube site appears clean and intact Skin: Warm and dry Ext: No LE edema Neuro: A&Ox4   Assessment/Plan:  Principal Problem:   Anemia, posthemorrhagic, acute Active Problems:   Benign esophageal stricture   Severe malnutrition (HCC)   PEG (percutaneous endoscopic gastrostomy) status ([place])   Long term current use of anticoagulant therapy   Debility   Advanced care planning/counseling discussion   Protein-calorie  malnutrition, severe  [name] is a 77 yo  with history of recent CVA s/p ICA stenting, severe esophageal stricture s/p multiple dilations, PEG tube placement in setting of recurrent aspiration pneumonia, protein-calorie malnutrition, and unprovoked DVT/PE admitted to ICU 1/20 for acute blood loss from GIB on DAPT, has been stable on floor-status with stable hemoglobin and no evidence of L ICA stent restenosis.   Acute blood loss anemia secondary to GI bleed, improving Patient was admitted to ICU 1/20 from CIR for upper GI bleed in the setting of DAPT post-ICA stent, s/p 3 units pRBCs, has been stable since readmission to the floor on 1/24. EGDs this admission showed significant stenosis of esophagus and GI was unable to traverse past the stenosis.Total iron deficit is '1300mg'$ , remaining iron deficit after 3x transfusions is '500mg'$ . Overall, patient's hemoglobin has remained stable since since readmission with no evidence of further bleeding and is medically stable for discharge pending SNF placement.  -IV Ferrelicit '250mg'$  (1/2) -IV Protonix '40mg'$  q12h -Sucralfate via PEG every 6 hours   Hx of recent L MCA CVA s/p ICA stent No evidence of L Carotid restenosis on U/S. Discussed with Neuro IR, who agreed that continuing aspirin as single antiplatelet agent in the absence of restenosis given concurrent risk  of recurrent GI bleed is a reasonable choice to balance risk vs benefit. Per Dr. Ronny Flurry [place] conversation, patient endorsed greater desire to decrease risk of stroke and willing to tolerate increased GI bleeding risk. -Aspirin '325mg'$  daily per tube  Protein-calorie malnutrition PEG tube dependent 2/2 esophogeal stenosis and hx of recurrent aspiration PNA PEG was dislodged and replaced 01/21/23 with 18 Fr G-tube.  -Continue tube feeds at 46m/hr -Scopolamine to reduce burden of oral secretions that have to pass through stenosed esophagus   T2DM  Glucose has been well-controlled this  admission, morning CBG 212 in setting of tube feeding.  -Continue to monitor  Rheumatoid Arthritis -Patient previously on chronic prednisone for RA prior to last admission for CVA, prednisone was restarted during previous admission but held since onset of GI bleed. BP has been stable, plan to continue to hold prednisone this admission.  -Hold prednisone  Full Code Diet: TF 65 mL/h IVF: None VTE: None due to risk of GI bleeding Family Update: By phone    LOS: 8 days   NSpero Curb Medical Student 01/23/2023, 10:25 AM

## 2023-01-24 LAB — CBC
HCT: 27.6 % — ABNORMAL LOW (ref 39.0–52.0)
Hemoglobin: 8.8 g/dL — ABNORMAL LOW (ref 13.0–17.0)
MCH: 28.8 pg (ref 26.0–34.0)
MCHC: 31.9 g/dL (ref 30.0–36.0)
MCV: 90.2 fL (ref 80.0–100.0)
Platelets: 245 10*3/uL (ref 150–400)
RBC: 3.06 MIL/uL — ABNORMAL LOW (ref 4.22–5.81)
RDW: 16 % — ABNORMAL HIGH (ref 11.5–15.5)
WBC: 6.5 10*3/uL (ref 4.0–10.5)
nRBC: 0 % (ref 0.0–0.2)

## 2023-01-24 LAB — GLUCOSE, CAPILLARY
Glucose-Capillary: 172 mg/dL — ABNORMAL HIGH (ref 70–99)
Glucose-Capillary: 157 mg/dL — ABNORMAL HIGH (ref 70–99)

## 2023-01-24 MED ORDER — FREE WATER
150.0000 mL | Freq: Every day | Status: DC
  Administered 2023-01-24 – 2023-01-28 (×25): 150 mL

## 2023-01-24 MED ORDER — OSMOLITE 1.5 CAL PO LIQD
1000.0000 mL | ORAL | Status: DC
  Administered 2023-01-24: 1000 mL
  Filled 2023-01-24 (×2): qty 1000

## 2023-01-24 MED ORDER — OSMOLITE 1.5 CAL PO LIQD
1530.0000 mL | ORAL | Status: DC
  Administered 2023-01-24 – 2023-01-28 (×5): 1530 mL
  Filled 2023-01-24 (×7): qty 2000
  Filled 2023-01-24: qty 1659

## 2023-01-24 MED ORDER — ACETAMINOPHEN 500 MG PO TABS
1000.0000 mg | ORAL_TABLET | Freq: Four times a day (QID) | ORAL | Status: DC | PRN
  Filled 2023-01-24: qty 2

## 2023-01-24 NOTE — Progress Notes (Signed)
Nutrition Follow-up  DOCUMENTATION CODES:   Severe malnutrition in context of chronic illness  INTERVENTION:  Tube Feeds via G-tube: Osmolite 1.5 at 85 mL/hr x 18 hours (1530 ml per day) 150 mL free water flush - 6 times per day Provides 2295 kcal, 96 gm protein, 2066 ml free water daily  NUTRITION DIAGNOSIS:  Severe Malnutrition related to chronic illness as evidenced by severe muscle depletion, severe fat depletion. 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 Team asked RD to adjust pt tube feeds to cyclic to allow pt time off pump.   RD met with pt. RD observed pt tube, pt with G-tube in place. Denies any nausea or vomiting. Discussed transitioning off continuous feeds; pt agreeable.   Discussed with RN.   Medications reviewed and include: Protonix, Senokot-S, Sucralfate, IV ferric gluconate Labs reviewed: 24 hr CBGs 145-175  Diet Order:   Diet Order             Diet NPO time specified  Diet effective now                   EDUCATION NEEDS:  Not appropriate for education at this time  Skin:  Skin Assessment: Reviewed RN Assessment  Last BM:  1/29  Height:  Ht Readings from Last 1 Encounters:  01/06/23 '6\' 2"'$  (1.88 m)    Weight:  Wt Readings from Last 1 Encounters:  01/23/23 70.3 kg   Ideal Body  Weight:  83.6 kg  BMI:  Body mass index is 19.9 kg/m.  Estimated Nutritional Needs:  Kcal:  2100-2500 kcal/d Protein:  105-120g/d Fluid:  2.2-2.5L/d   Hermina Barters RD, LDN Clinical Dietitian See [facility] for contact information.

## 2023-01-24 NOTE — Progress Notes (Signed)
Physical Therapy Treatment Patient Details Name: [name] MRN: [id number] DOB: 10/07/1946 Today's Date: 01/24/2023   History of Present Illness 77 y.o. male admitted 1/1 after found on floor.  [id number] foci of restricted diffusion left frontal lobe and left parietal lobe.  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  Pt (+) flu A and possible aspiration PNA. 1/5 code stroke with increased Rt weakness. 1/8 carotid stent in IR. Transfer to Rehab 1/12 and transfer back to Acute 1/20 due to GIB and VDRF.  Extubated 1/21.  New PT eval ordered 1/22.  PMHx: RA, ILD, DVT/PE, T2DM, HTN, HLD, Esophageal outlet obstructions s/p multiple dilatations and GJ tube placement for FTT, severe protein calorie malnutrition, AAA L Illiac artery aneurysm s/p EVAR    PT Comments    Pt received in bed with c/o headache. Agreeable to participation in therapy. He required min assist bed mobility, and min assist sit to stand.  He demonstrated good sitting balance and poor standing balance. Pt performed LE exercises sitting and supine. Session limited by headache, dizziness, and +orthostatics. Pt remained in bed at end of session, HOB at 30 degrees.   Recommendations for follow up therapy are one component of a multi-disciplinary discharge planning process, led by the attending physician.  Recommendations may be updated based on patient status, additional functional criteria and insurance authorization.  Follow Up Recommendations  Skilled nursing-short term rehab (<3 hours/day) Can patient physically be transported by private vehicle: Yes   Assistance Recommended at Discharge Frequent or constant Supervision/Assistance  Patient can return home with the following A little help with walking and/or transfers;A lot of help with bathing/dressing/bathroom;Assistance with cooking/housework;Direct supervision/assist for medications management;Assist for transportation;Assistance with  feeding   Equipment Recommendations  Rolling walker (2 wheels)    Recommendations for Other Services       Precautions / Restrictions Precautions Precautions: Fall;Other (comment) Precaution Comments: NPO, PEG, +orthostatics     Mobility  Bed Mobility Overal bed mobility: Needs Assistance Bed Mobility: Supine to Sit, Sit to Supine     Supine to sit: Min assist Sit to supine: Min assist   General bed mobility comments: +rail, increased time, assist to elevate trunk, assist with BLE back to bed    Transfers Overall transfer level: Needs assistance Equipment used: 1 person hand held assist Transfers: Sit to/from Stand Sit to Stand: Min assist           General transfer comment: unable to transfer to recliner due to dizziness, headache, +orthostatics    Ambulation/Gait                   Stairs             Wheelchair Mobility    Modified Rankin (Stroke Patients Only)       Balance Overall balance assessment: Needs assistance Sitting-balance support: Feet supported, No upper extremity supported Sitting balance-Leahy Scale: Good     Standing balance support: Single extremity supported, During functional activity Standing balance-Leahy Scale: Poor Standing balance comment: reliant on external support                            Cognition Arousal/Alertness: Awake/alert Behavior During Therapy: Flat affect Overall Cognitive Status: No family/caregiver present to determine baseline cognitive functioning  General Comments: Flat. Despondent. Following commands consistently.        Exercises General Exercises - Lower Extremity Ankle Circles/Pumps: AROM, Both, 20 reps, Supine, Seated Long Arc Quad: AROM, Right, Left, 10 reps, Seated Heel Slides: AROM, Right, Left, 10 reps, Supine Hip ABduction/ADduction: AROM, Both, 10 reps, Supine Hip Flexion/Marching: AROM, Right, Left, 10 reps,  Seated    General Comments General comments (skin integrity, edema, etc.): BP: supine 130/57, sitting 104/57, return to supine 118/58. Unable to tolerate extended stand for standing BP, due to dizziness      Pertinent Vitals/Pain Pain Assessment Pain Assessment: Faces Faces Pain Scale: Hurts even more Pain Location: headache Pain Descriptors / Indicators: Headache Pain Intervention(s): Limited activity within patient's tolerance, Repositioned, Patient requesting pain meds-RN notified    Home Living                          Prior Function            PT Goals (current goals can now be found in the care plan section) Acute Rehab PT Goals Patient Stated Goal: home Progress towards PT goals: Progressing toward goals    Frequency    Min 3X/week      PT Plan Discharge plan needs to be updated    Co-evaluation              AM-PAC PT "6 Clicks" Mobility   Outcome Measure  Help needed turning from your back to your side while in a flat bed without using bedrails?: A Little Help needed moving from lying on your back to sitting on the side of a flat bed without using bedrails?: A Little Help needed moving to and from a bed to a chair (including a wheelchair)?: A Little Help needed standing up from a chair using your arms (e.g., wheelchair or bedside chair)?: A Little Help needed to walk in hospital room?: A Lot Help needed climbing 3-5 steps with a railing? : A Lot 6 Click Score: 16    End of Session Equipment Utilized During Treatment: Gait belt Activity Tolerance: Patient limited by pain;Treatment limited secondary to medical complications (Comment) (symptomatic orthostatic hypotension) Patient left: in bed;with call bell/phone within reach Nurse Communication: Patient requests pain meds;Mobility status PT Visit Diagnosis: Unsteadiness on feet (R26.81);Muscle weakness (generalized) (M62.81);Difficulty in walking, not elsewhere classified (R26.2)      Time: 6004-5997 PT Time Calculation (min) (ACUTE ONLY): 17 min  Charges:  $Therapeutic Exercise: 8-22 mins                     Lorrin Goodell, PT  Office # [phone] Pager [phone]    Lorriane Shire 01/24/2023, 8:40 AM

## 2023-01-24 NOTE — Care Management Important Message (Signed)
Important Message  Patient Details  Name: [name] MRN: [id number] Date of Birth: August 29, 1946   Medicare Important Message Given:  Yes     Hannah Beat 01/24/2023, 2:46 PM

## 2023-01-24 NOTE — TOC Progression Note (Addendum)
Transition of Care [facility]) - Progression Note    Patient Details  Name: [name] MRN: [id number] Date of Birth: 1946/08/19  Transition of Care [name]) CM/SW [place], LCSW Phone Number: 01/24/2023, 8:32 AM  Clinical Narrative:    8:30am-CSW reached out to [facility]. Awaiting response.  11:45am-Per [employer] they should have an opening tomorrow.    Left vm for son.  3:40pm-Son returned call. He reported being ok with [facility] for transport.    Expected Discharge Plan: [place] Barriers to Discharge: SNF Pending bed offer, Continued Medical Work up  Expected Discharge Plan and Services In-house Referral: Clinical Social Work   Post Acute Care Choice: [place] Living arrangements for the past 2 months: Apartment                                       Social Determinants of Health (SDOH) Interventions [place]: No Food Insecurity (12/28/2022)  Housing: Low Risk  (12/28/2022)  Transportation Needs: No Transportation Needs (12/28/2022)  Utilities: Not At Risk (12/28/2022)  Tobacco Use: Medium Risk (01/24/2023)    Readmission Risk Interventions    01/21/2023    9:13 AM  Readmission Risk Prevention Plan  Transportation Screening Complete  Medication Review (RN Care Manager) Complete  PCP or Specialist appointment within 3-5 days of discharge Complete  HRI or [place] Complete  SW Recovery Care/Counseling Consult Complete  Palliative Care Screening Not [place] Complete

## 2023-01-24 NOTE — Progress Notes (Signed)
Subjective:  Patient reports a new headache that started yesterday, endorses pain is in a band-like distribution. He denies noticing any new neurologic changes or weakness.   Tried to reach son by phone yesterday but no answer.   Objective:  Vital signs in last 24 hours: Vitals:   01/24/23 0400 01/24/23 0500 01/24/23 0816 01/24/23 1145  BP: (!) 119/57  (!) 118/58 103/63  Pulse: 73  81 73  Resp: (!) 21   18  Temp:  98.1 F (36.7 C)    TempSrc:  Oral Oral   SpO2: 98%     Weight:       Weight change:  No intake or output data in the 24 hours ending 01/24/23 1148  CBC    Component Value Date/Time   WBC 6.5 01/24/2023 0234   RBC 3.06 (L) 01/24/2023 0234   HGB 8.8 (L) 01/24/2023 0234   HGB 13.7 11/20/2014 1140   HCT 27.6 (L) 01/24/2023 0234   HCT 41.8 11/20/2014 1140   PLT 245 01/24/2023 0234   PLT 172 11/20/2014 1140   MCV 90.2 01/24/2023 0234   MCV 100 11/20/2014 1140   MCH 28.8 01/24/2023 0234   MCHC 31.9 01/24/2023 0234   RDW 16.0 (H) 01/24/2023 0234   RDW 15.0 (H) 11/20/2014 1140   LYMPHSABS 0.8 01/07/2023 0608   LYMPHSABS 1.0 11/20/2014 1140   MONOABS 1.1 (H) 01/07/2023 0608   MONOABS 0.5 11/20/2014 1140   EOSABS 0.3 01/07/2023 0608   EOSABS 0.0 11/20/2014 1140   BASOSABS 0.0 01/07/2023 0608   BASOSABS 0.1 11/20/2014 1140    Physical Exam General: Alert, chronically-ill appearing. In no acute distress. CV: RRR, no murmurs, rubs, or gallops Pulm: Normal respiratory effort on room air Abd: PEG site covered with clean bandage, PEG tube appears patent Skin: Warm and dry Ext: No LE edema Neuro: A&O x4   Assessment/Plan:  Principal Problem:   Anemia, posthemorrhagic, acute Active Problems:   Benign esophageal stricture   Severe malnutrition (HCC)   PEG (percutaneous endoscopic gastrostomy) status ([place])   Long term current use of anticoagulant therapy   Debility   Advanced care planning/counseling discussion   Protein-calorie malnutrition,  severe  [name] is a 77 yo  with history of recent CVA s/p ICA stenting, severe esophageal stricture s/p multiple dilations, PEG tube placement in setting of recurrent aspiration pneumonia, protein-calorie malnutrition, and unprovoked DVT/PE admitted to ICU 1/20 for acute blood loss from GIB on DAPT, has been stable on floor-status with stable hemoglobin and no evidence of L ICA stent restenosis.   Acute blood loss anemia secondary to GI bleed, improving  Patient has been stable on the floor post-ICU transfer, hemoglobin remains stable today at 8.8. Patient's total iron deficit is '1300mg'$ , adjusted to '500mg'$  post-transfusions and will replete again today. Patient is medically stable for discharge pending availability [place] SNF, [facility] states facility will have a bed for him tomorrow.  -IV Ferrelicit '250mg'$  (2/2) -IV Protonix '40mg'$  q12h -Sucralfate via PEG every 6 hours  Hx of recent L MCA CVA s/p ICA stent  Patient's new headache is likely tension headache, low concern that is Patient started on Aspirin as single antiplatelet agent due to high risk for recurrent GI bleed without ability for intervention given esophageal stenosis.  -Aspirin '325mg'$  daily per tube  Protein-calorie malnutrition PEG tube dependent 2/2 esophogeal stenosis and hx of recurrent aspiration PNA PEG was dislodged and replaced 01/21/23 with 18 Fr G-tube. PEG appears intact today on exam.  Will plan to follow up with RD recommendations for tube feeds for discharging to SNF.  -Continue tube feeds at 73m/hr -Scopolamine to reduce burden of oral secretions that have to pass through stenosed esophagus   T2DM  CBG this morning 172, blood sugars have been stable this admission.  -Continue to monitor  Rheumatoid Arthritis  -Patient previously on chronic prednisone for RA prior to last admission for CVA, prednisone was restarted during previous admission but held since onset of GI bleed. BP has been stable, plan to  continue to hold prednisone this admission.  -Hold prednisone    Full Code Diet: TF 65 mL/h IVF: None VTE: None due to risk of GI bleeding Family Update: Will call son today    LOS: 9 days   NSpero Curb Medical Student 01/24/2023, 11:48 AM

## 2023-01-25 DIAGNOSIS — Z931 Gastrostomy status: Secondary | ICD-10-CM

## 2023-01-25 DIAGNOSIS — E46 Unspecified protein-calorie malnutrition: Secondary | ICD-10-CM

## 2023-01-25 LAB — CBC
HCT: 27 % — ABNORMAL LOW (ref 39.0–52.0)
Hemoglobin: 8.8 g/dL — ABNORMAL LOW (ref 13.0–17.0)
MCH: 28.9 pg (ref 26.0–34.0)
MCHC: 32.6 g/dL (ref 30.0–36.0)
MCV: 88.5 fL (ref 80.0–100.0)
Platelets: 258 10*3/uL (ref 150–400)
RBC: 3.05 MIL/uL — ABNORMAL LOW (ref 4.22–5.81)
RDW: 15.9 % — ABNORMAL HIGH (ref 11.5–15.5)
WBC: 7 10*3/uL (ref 4.0–10.5)
nRBC: 0 % (ref 0.0–0.2)

## 2023-01-25 LAB — GLUCOSE, CAPILLARY
Glucose-Capillary: 133 mg/dL — ABNORMAL HIGH (ref 70–99)
Glucose-Capillary: 147 mg/dL — ABNORMAL HIGH (ref 70–99)
Glucose-Capillary: 171 mg/dL — ABNORMAL HIGH (ref 70–99)
Glucose-Capillary: 181 mg/dL — ABNORMAL HIGH (ref 70–99)
Glucose-Capillary: 185 mg/dL — ABNORMAL HIGH (ref 70–99)
Glucose-Capillary: 190 mg/dL — ABNORMAL HIGH (ref 70–99)

## 2023-01-25 MED ORDER — COSYNTROPIN 0.25 MG IJ SOLR
0.2500 mg | Freq: Once | INTRAMUSCULAR | Status: AC
  Administered 2023-01-26: 0.25 mg via INTRAVENOUS
  Filled 2023-01-25: qty 0.25

## 2023-01-25 MED ORDER — SODIUM CHLORIDE 0.9 % IV SOLN: INTRAVENOUS | Status: AC

## 2023-01-25 MED ORDER — COSYNTROPIN 0.25 MG IJ SOLR: 0.2500 mg | Freq: Once | INTRAMUSCULAR | Status: DC

## 2023-01-25 NOTE — TOC Progression Note (Addendum)
Transition of Care [facility]) - Progression Note    Patient Details  Name: [name] MRN: [id number] Date of Birth: 14-Sep-1946  Transition of Care [facility]) CM/SW [place], LCSW Phone Number: 01/25/2023, 8:44 AM  Clinical Narrative:    8:44am-[facility] has bed available for patient after 11am and requires DC Summary by 1pm due to a training. MD aware.    10:26am-CSW received update that patient now not medically stable for DC. CSW updated [employer]. Will reassess for bed availability tomorrow.    Expected Discharge Plan: [place] Barriers to Discharge: SNF Pending bed offer, Continued Medical Work up  Expected Discharge Plan and Services In-house Referral: Clinical Social Work   Post Acute Care Choice: [place] Living arrangements for the past 2 months: Apartment                                       Social Determinants of Health (SDOH) Interventions [place]: No Food Insecurity (12/28/2022)  Housing: Low Risk  (12/28/2022)  Transportation Needs: No Transportation Needs (12/28/2022)  Utilities: Not At Risk (12/28/2022)  Tobacco Use: Medium Risk (01/24/2023)    Readmission Risk Interventions    01/21/2023    9:13 AM  Readmission Risk Prevention Plan  Transportation Screening Complete  Medication Review (RN Care Manager) Complete  PCP or Specialist appointment within 3-5 days of discharge Complete  HRI or [place] Complete  SW Recovery Care/Counseling Consult Complete  Palliative Care Screening Not [place] Complete

## 2023-01-25 NOTE — Progress Notes (Signed)
OT Cancellation Note  Patient Details Name: [name] MRN: [id number] DOB: May 02, 1946   Cancelled Treatment:    Reason Eval/Treat Not Completed: Other (comment).  Patient just returned to bed.  In with RN.  [name] D Keyara Ent 01/25/2023, 3:30 PM 01/25/2023  RP, OTR/L  Acute Rehabilitation Services  Office:  [phone]

## 2023-01-25 NOTE — Progress Notes (Signed)
Mobility Specialist Progress Note:    01/25/23 1000  Mobility  Activity Stood at bedside  Level of Assistance Standby assist, set-up cues, supervision of patient - no hands on  Assistive Device None  Activity Response Tolerated well  Mobility Referral Yes  $Mobility charge 1 Mobility   Pt was agreeable for mobility session. Received pt in chair, performed orthostatics. Pt c/o dizziness upon standing. Nurse notified of BP stats, pt recovered, left pt in chair with all needs met.   BP in chair: 116/60 (77) Standing: 68/37 (49) BP in chair: 101/48 (63)  [name] Mobility Specialist Please contact via S[facility] or  Rehab office at [phone]

## 2023-01-25 NOTE — Progress Notes (Signed)
   Subjective:  No acute events overnight. This morning we were notified that patient's orthostatic BP dropped 361W>43X systolic. Patient was sitting in chair on assessment, reports that he feels well today and was able to walk to the chair. Orthostatics were repeated while we were in the room with similar drop in systolic BP as earlier, and patient endorsed weakness while standing.   Objective:  Vital signs in last 24 hours: Vitals:   01/25/23 0951 01/25/23 1007 01/25/23 1009 01/25/23 1011  BP: 101/78 (!) 110/59 (!) 68/51 (!) 95/54  Pulse:      Resp:      Temp:      TempSrc:      SpO2:      Weight:       Weight change:   Intake/Output Summary (Last 24 hours) at 01/25/2023 1124 Last data filed at 01/25/2023 0920 Gross per 24 hour  Intake --  Output 1250 ml  Net -1250 ml   Physical Exam General: Alert, chronically-ill appearing. No acute distress.  HEENT: Mucous membranes dry.  CV: Normal rate.  Pulm: Normal respiratory effort on room air.  Skin: Warm and dry Ext: No LE edema Neuro: A&Ox4 Psych: Pleasant, appropriate affect  Assessment/Plan:  Principal Problem:   Anemia, posthemorrhagic, acute Active Problems:   Benign esophageal stricture   Severe malnutrition (HCC)   PEG (percutaneous endoscopic gastrostomy) status ([place])   Long term current use of anticoagulant therapy   Debility   Advanced care planning/counseling discussion   Protein-calorie malnutrition, severe  [name] is a 77 yo  with history of recent CVA s/p ICA stenting, severe esophageal stricture s/p multiple dilations, PEG tube placement in setting of recurrent aspiration pneumonia, protein-calorie malnutrition, and unprovoked DVT/PE admitted to ICU 1/20 for acute blood loss from GIB on DAPT, today has new orthostatic hypotension.   Protein-calorie malnutrition PEG tube dependent 2/2 esophogeal stenosis and hx of recurrent aspiration PNA Patient is having new orthostatic hypotension today and  appears dry on exam, likely from inadequate free water. With patient's history of recent CVA s/p ICA stent, would need to ensure patient's BP is stable before discharge. RD gave recommendations for increasing tube feed rate with 18h/day schedule to allow for patient to better participate in rehab. Will plan to give IVF today and recheck orthostatics before potentially discharging tomorrow.  -IV NS '@150mL'$ /hr for 10h -Tube feeds at 93m/hr for 18h -1568mfree water x6 daily -Scopolamine to reduce burden of oral secretions that have to pass through stenosed esophagus    Acute blood loss anemia secondary to GI bleed, improving  Patient has been stable on the floor post-ICU transfer, hemoglobin remains stable today at 8.8.  -IV Protonix '40mg'$  q12h -Sucralfate via PEG q6h  Hx of recent L MCA CVA s/p ICA stent  Patient denies headache today. Patient started on Aspirin as single antiplatelet agent due to high risk for recurrent GI bleed without ability for intervention given esophageal stenosis.  -Aspirin '325mg'$  daily per tube  T2DM   CBG this morning 181, blood sugars have been stable this admission.  -Continue to monitor  Rheumatoid Arthritis   -Hold prednisone in setting of recent GI bleed     Full Code Diet: TF 85 mL/h IVF: IV NS '@150mL'$ /hr  VTE: None due to risk of GI bleeding Family Update: Spoke with [employer] officerex-wife), will update son today    LOS: 10 days   NeSpero CurbMedical Student 01/25/2023, 11:24 AM

## 2023-01-26 LAB — GLUCOSE, CAPILLARY
Glucose-Capillary: 135 mg/dL — ABNORMAL HIGH (ref 70–99)
Glucose-Capillary: 151 mg/dL — ABNORMAL HIGH (ref 70–99)
Glucose-Capillary: 158 mg/dL — ABNORMAL HIGH (ref 70–99)
Glucose-Capillary: 161 mg/dL — ABNORMAL HIGH (ref 70–99)
Glucose-Capillary: 168 mg/dL — ABNORMAL HIGH (ref 70–99)
Glucose-Capillary: 169 mg/dL — ABNORMAL HIGH (ref 70–99)
Glucose-Capillary: 206 mg/dL — ABNORMAL HIGH (ref 70–99)

## 2023-01-26 LAB — CBC
HCT: 25.8 % — ABNORMAL LOW (ref 39.0–52.0)
Hemoglobin: 8.4 g/dL — ABNORMAL LOW (ref 13.0–17.0)
MCH: 29.2 pg (ref 26.0–34.0)
MCHC: 32.6 g/dL (ref 30.0–36.0)
MCV: 89.6 fL (ref 80.0–100.0)
Platelets: 255 10*3/uL (ref 150–400)
RBC: 2.88 MIL/uL — ABNORMAL LOW (ref 4.22–5.81)
RDW: 15.9 % — ABNORMAL HIGH (ref 11.5–15.5)
WBC: 6.8 10*3/uL (ref 4.0–10.5)
nRBC: 0 % (ref 0.0–0.2)

## 2023-01-26 LAB — ACTH STIMULATION, 3 TIME POINTS
Cortisol, 30 Min: 8.6 ug/dL
Cortisol, 60 Min: 10.9 ug/dL
Cortisol, Base: 3.1 ug/dL

## 2023-01-26 MED ORDER — HYDROCORTISONE 5 MG PO TABS
15.0000 mg | ORAL_TABLET | Freq: Every day | ORAL | Status: DC
  Administered 2023-01-27 – 2023-01-28 (×2): 15 mg
  Filled 2023-01-26 (×2): qty 1

## 2023-01-26 MED ORDER — HYDROCORTISONE 10 MG PO TABS
10.0000 mg | ORAL_TABLET | Freq: Every day | ORAL | Status: DC
  Administered 2023-01-26: 10 mg
  Filled 2023-01-26: qty 1

## 2023-01-26 MED ORDER — HYDROCORTISONE 10 MG PO TABS: 10.0000 mg | ORAL_TABLET | Freq: Every day | ORAL | Status: DC

## 2023-01-26 MED ORDER — HYDROCORTISONE 10 MG PO TABS
10.0000 mg | ORAL_TABLET | Freq: Every day | ORAL | Status: DC
  Administered 2023-01-27 – 2023-01-28 (×2): 10 mg
  Filled 2023-01-26 (×2): qty 1

## 2023-01-26 MED ORDER — HYDROCORTISONE 5 MG PO TABS: 15.0000 mg | ORAL_TABLET | Freq: Every day | ORAL | Status: DC

## 2023-01-26 MED ORDER — HYDROCORTISONE 5 MG PO TABS
5.0000 mg | ORAL_TABLET | Freq: Every day | ORAL | Status: DC
  Administered 2023-01-26: 5 mg
  Filled 2023-01-26: qty 1

## 2023-01-26 NOTE — Progress Notes (Signed)
Mobility Specialist Progress Note:    01/26/23 1600  Therapy Vitals  BP (!) 110/55  Patient Position (if appropriate) Lying  Mobility  Activity Stood at bedside  Level of Assistance Minimal assist, patient does 75% or more  Assistive Device None  Activity Response Tolerated well  Mobility Referral Yes  $Mobility charge 1 Mobility   Pt was agreeable for orthostatics. Recorded the following BP stats with nursing team present. Tolerated well, c/o dizziness upon standing. Left pt in care of nursing staff, all needs met.   Supine: 102/65 EOB: 96/53 Standing: 77/43   Royetta Crochet Mobility Specialist Please contact via SecureChat or  Rehab office at [phone]

## 2023-01-26 NOTE — Progress Notes (Signed)
Physical Therapy Treatment Patient Details Name: [name] MRN: [id number] DOB: 1946/04/25 Today's Date: 01/26/2023   History of Present Illness 77 y.o. male admitted 1/1 after found on floor.  OMV:[id number] foci of restricted diffusion left frontal lobe and left parietal lobe.  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  Pt (+) flu A and possible aspiration PNA. 1/5 code stroke with increased Rt weakness. 1/8 carotid stent in IR. Transfer to Rehab 1/12 and transfer back to Acute 1/20 due to GIB and VDRF.  Extubated 1/21.  New PT eval ordered 1/22.  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 Pt noted to be soiled with old BM so pt rolls L<>R with min assist to fully roll to allow PT to perform peri hygiene dependently. Pt unaware of incontinence. Pt is able to complete supine>sit with HOB elevated & holding to PT's hand for support, stand pivot bed>recliner on L with 1UE HHA & min assist. Pt notes slight dizziness with sitting EOB & in recliner, BP noted below. Pt requires cuing for BLE strengthening exercises. Continue to recommend STR upon d/c.  BP checked in RUE: Sitting in recliner: 96/56 mmHg MAP 70 At end of session sitting in recliner with BLE elevated: 119/61 mmHg MAP 79    Recommendations for follow up therapy are one component of a multi-disciplinary discharge planning process, led by the attending physician.  Recommendations may be updated based on patient status, additional functional criteria and insurance authorization.  Follow Up Recommendations  Skilled nursing-short term rehab (<3 hours/day) Can patient physically be transported by private vehicle: Yes   Assistance Recommended at Discharge  Frequent or constant Supervision/Assistance  Patient can return home with the following A little help with walking and/or transfers;A lot of help with bathing/dressing/bathroom;Assistance with cooking/housework;Direct supervision/assist for medications management;Assist for transportation;Assistance with feeding   Equipment Recommendations  Rolling walker (2 wheels)    Recommendations for Other Services Rehab consult     Precautions / Restrictions Precautions Precautions: Fall;Other (comment) Precaution Comments: NPO, PEG, +orthostatics Restrictions Weight Bearing Restrictions: No     Mobility  Bed Mobility Overal bed mobility: Needs Assistance Bed Mobility: Rolling Rolling: Min assist   Supine to sit: Min assist, HOB elevated (pt elects to holds PT's hand to pull himself upright)     General bed mobility comments: Pt is able to roll L<>R with min assist.    Transfers Overall transfer level: Needs assistance Equipment used: 1 person hand held assist Transfers: Sit to/from Stand, Bed to chair/wheelchair/BSC Sit to Stand: Min assist Stand pivot transfers: Min assist              Ambulation/Gait                   Stairs             Wheelchair Mobility    Modified Rankin (Stroke Patients Only)       Balance Overall balance assessment: Needs assistance Sitting-balance support: Feet supported, No upper extremity supported Sitting balance-Leahy Scale: Good     Standing balance support: Single extremity supported, During functional activity Standing balance-Leahy Scale: Poor  Cognition Arousal/Alertness: Awake/alert Behavior During Therapy: Flat affect (irritated, decreased understanding of PT being in room despite education) Overall Cognitive Status: No family/caregiver present to determine baseline cognitive functioning                                 General Comments: Follows simple  commands with extra time.        Exercises General Exercises - Lower Extremity Long Arc Quad: AROM, Strengthening, Both, 10 reps, Seated (not able to complete full ROM, worse on LLE than RLE) Hip ABduction/ADduction: AROM, Strengthening, Both, Seated, 10 reps (hip adduction pillow squeezes with cuing for technique) Hip Flexion/Marching: AROM, Strengthening, Both, 10 reps, Seated    General Comments General comments (skin integrity, edema, etc.): Pt noted to be soiled with old BM & pt completed bed mobility to allow PT to perform peri hygiene, hand hygiene & assist with changing into a clean gown.      Pertinent Vitals/Pain Pain Assessment Pain Assessment: No/denies pain    Home Living                          Prior Function            PT Goals (current goals can now be found in the care plan section) Acute Rehab PT Goals Patient Stated Goal: home PT Goal Formulation: With patient Time For Goal Achievement: 01/31/23 Potential to Achieve Goals: Fair Progress towards PT goals: Progressing toward goals    Frequency    Min 3X/week      PT Plan Current plan remains appropriate    Co-evaluation              AM-PAC PT "6 Clicks" Mobility   Outcome Measure  Help needed turning from your back to your side while in a flat bed without using bedrails?: A Little Help needed moving from lying on your back to sitting on the side of a flat bed without using bedrails?: A Little Help needed moving to and from a bed to a chair (including a wheelchair)?: A Little Help needed standing up from a chair using your arms (e.g., wheelchair or bedside chair)?: A Little Help needed to walk in hospital room?: A Lot Help needed climbing 3-5 steps with a railing? : A Lot 6 Click Score: 16    End of Session   Activity Tolerance: Patient limited by fatigue Patient left: in chair;with call bell/phone within reach;with nursing/sitter in room Nurse Communication: Mobility  status PT Visit Diagnosis: Unsteadiness on feet (R26.81);Muscle weakness (generalized) (M62.81);Difficulty in walking, not elsewhere classified (R26.2)     Time: 6226-3335 PT Time Calculation (min) (ACUTE ONLY): 24 min  Charges:  $Therapeutic Activity: 23-37 mins                     Lavone Nian, PT, DPT 01/26/23, 8:54 AM   Waunita Schooner 01/26/2023, 8:52 AM

## 2023-01-26 NOTE — Progress Notes (Signed)
Subjective:  Yesterday afternoon patient's son informed us that patient seems more confused than normal when they spoke on the phone. On assessment, patient was oriented to self, place, and situation but not oriented to time and he had difficulty with concentrating on screening cognitive questions. No new or worsened focal deficits on neuro exam.   Today patient is lying comfortably in bed, had appropriate questions while discussing plan.   Objective:  Vital signs in last 24 hours: Vitals:   01/26/23 0717 01/26/23 0720 01/26/23 0723 01/26/23 0758  BP: 138/68 (!) 121/56 (!) 94/53 126/68  Pulse: 81   80  Resp: 16   13  Temp: 97.9 F (36.6 C)   97.7 F (36.5 C)  TempSrc: Oral   Oral  SpO2: 99%   100%  Weight:       Weight change:  No intake or output data in the 24 hours ending 01/26/23 0936  Cortisol, Base ug/dL 3.1  Comment: NO NORMAL RANGE ESTABLISHED FOR THIS TEST  Cortisol, 30 Min ug/dL 8.6  Cortisol, 60 Min ug/dL 10.9    Physical Exam General: Alert, chronically-ill appearing. In no acute distress.  CV: RRR, no murmurs, rubs, or gallops Pulm: Normal respiratory effort on room air Skin: Warm and dry Neuro: Alert and conversational Psych: Pleasant, appropriate affect  Assessment/Plan:  Principal Problem:   Anemia, posthemorrhagic, acute Active Problems:   Benign esophageal stricture   Severe malnutrition (HCC)   PEG (percutaneous endoscopic gastrostomy) status ([place])   Long term current use of anticoagulant therapy   Debility   Advanced care planning/counseling discussion   Protein-calorie malnutrition, severe  AVETT REINECK is a 77 yo  with history of recent CVA s/p ICA stenting, severe esophageal stricture s/p multiple dilations, PEG tube placement in setting of recurrent aspiration pneumonia, protein-calorie malnutrition, and RA on chronic prednisone admitted to ICU 1/20 for acute blood loss from GIB on DAPT, today continues to have orthostatic  hypotension in the setting of adrenal insufficiency.  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 Cosyntropin test this morning is consistent with adrenal insufficiency. Plan to start hydrocortisone today and titrate as needed, patient will need to have stable blood pressure prior to discharge. -Start hydrocortisone '10mg'$  in the morning, '5mg'$  in afternoon per tube  Hx of recent L MCA CVA s/p ICA stent   Patient's confusion yesterday is most consistent with delirium in the setting of long hospitalization. Low suspicion for recrudescence or new CVA given normal neuro exam. Will continue to monitor for any changes.  -Delirium precautions -Aspirin '325mg'$  daily per tube  Acute blood loss anemia secondary to GI bleed, improving  Hemoglobin stable today at 8.4.  -IV Protonix '40mg'$  q12h -Sucralfate via PEG q6h  Protein-calorie malnutrition PEG tube dependent 2/2 esophogeal stenosis and hx of recurrent aspiration PNA Patient started on 18h tube feeding schedule this week to allow for better rehab participation at SNF on discharge.  -Tube feeds at 15m/hr for 18h -1548mfree water x6 daily -Scopolamine to reduce burden of oral secretions that have to pass through stenosed esophagus    T2DM  CBG this morning 169, blood sugars have been stable this admission.  -Continue to monitor  Full Code Diet: TF 85 mL/h IVF: None  VTE: None due to risk of GI bleeding Family Update: will update son today    LOS: 67 days   Spero Curb, Medical Student 01/26/2023, 9:36 AM

## 2023-01-26 NOTE — TOC Progression Note (Signed)
Transition of Care [facility]) - Progression Note    Patient Details  Name: [name] MRN: [id number] Date of Birth: 07-04-1946  Transition of [facility]) CM/SW [place], [place] Phone Number: 01/26/2023, 9:10 AM  Clinical Narrative:    CSW updated Rober Minion and patient's son that patient is not ready to discharge today per MD.   Expected Discharge Plan: Skilled Nursing Facility Barriers to Discharge: SNF Pending bed offer, Continued Medical Work up  Expected Discharge Plan and Services In-house Referral: Clinical Social Work   Post Acute Care Choice: [place] Living arrangements for the past 2 months: Apartment                                       Social Determinants of Health (SDOH) Interventions [place]: No Food Insecurity (12/28/2022)  Housing: Low Risk  (12/28/2022)  Transportation Needs: No Transportation Needs (12/28/2022)  Utilities: Not At Risk (12/28/2022)  Tobacco Use: Medium Risk (01/24/2023)    Readmission Risk Interventions    01/21/2023    9:13 AM  Readmission Risk Prevention Plan  Transportation Screening Complete  Medication Review (RN Care Manager) Complete  PCP or Specialist appointment within 3-5 days of discharge Complete  HRI or [employer] Complete  SW Recovery Care/Counseling Consult Complete  Palliative Care Screening Not [place] Complete

## 2023-01-27 LAB — GLUCOSE, CAPILLARY
Glucose-Capillary: 188 mg/dL — ABNORMAL HIGH (ref 70–99)
Glucose-Capillary: 174 mg/dL — ABNORMAL HIGH (ref 70–99)
Glucose-Capillary: 157 mg/dL — ABNORMAL HIGH (ref 70–99)
Glucose-Capillary: 176 mg/dL — ABNORMAL HIGH (ref 70–99)
Glucose-Capillary: 187 mg/dL — ABNORMAL HIGH (ref 70–99)

## 2023-01-27 MED ORDER — FLUDROCORTISONE ACETATE 0.1 MG PO TABS
0.1000 mg | ORAL_TABLET | Freq: Every day | ORAL | Status: DC
  Administered 2023-01-27 – 2023-01-28 (×2): 0.1 mg
  Filled 2023-01-27 (×2): qty 1

## 2023-01-27 NOTE — Progress Notes (Signed)
Occupational Therapy Treatment Patient Details Name: [name] MRN: [id number] DOB: 1946/03/08 Today's Date: 01/27/2023   History of present illness 77 y.o. male admitted 1/1 after found on floor.  GEZ:[id number] foci of restricted diffusion left frontal lobe and left parietal lobe.  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  Pt (+) flu A and possible aspiration PNA. 1/5 code stroke with increased Rt weakness. 1/8 carotid stent in IR. Transfer to Rehab 1/12 and transfer back to Acute 1/20 due to GIB and VDRF.  Extubated 1/21.  New PT eval ordered 1/22.  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 Patient adamant on wanting to a get to EOB without assistance but required min assist to complete. Patient continues to be min assist to transfer to recliner with 1 person HHA. Patient declined self care tasks stating he would do it at end of day. RUE ROM and squeeze ball exercises performed in chair. Discharge recommendation are for SNF for continued OT treatment to address functional transfers, grooming, and dressing. Acute OT to continue to follow    Recommendations for follow up therapy are one component of a multi-disciplinary discharge planning process, led by the attending physician.  Recommendations may be updated based on patient status, additional functional criteria and insurance authorization.    Follow Up Recommendations  Skilled nursing-short term rehab (<3 hours/day)     Assistance Recommended at Discharge Frequent or constant Supervision/Assistance  Patient can return home with the following  A lot of help with walking and/or transfers;A lot of help with bathing/dressing/bathroom;Assistance with cooking/housework;Direct  supervision/assist for medications management;Direct supervision/assist for financial management;Assist for transportation;Help with stairs or ramp for entrance   Equipment Recommendations  None recommended by OT    Recommendations for Other Services      Precautions / Restrictions Precautions Precautions: Fall;Other (comment) Precaution Comments: NPO, PEG, +orthostatics Restrictions Weight Bearing Restrictions: No       Mobility Bed Mobility Overal bed mobility: Needs Assistance Bed Mobility: Supine to Sit     Supine to sit: HOB elevated, Min assist     General bed mobility comments: min assist to raise trunk and increased time    Transfers Overall transfer level: Needs assistance Equipment used: 1 person hand held assist Transfers: Sit to/from Stand, Bed to chair/wheelchair/BSC Sit to Stand: Min assist     Step pivot transfers: Min assist     General transfer comment: cues for safety     Balance Overall balance assessment: Needs assistance Sitting-balance support: Feet supported, No upper extremity supported Sitting balance-Leahy Scale: Good     Standing balance support: Single extremity supported, During functional activity Standing balance-Leahy Scale: Poor Standing balance comment: reliant on external support                           ADL either performed or assessed with clinical judgement   ADL Overall ADL's : Needs assistance/impaired     Grooming: Wash/dry hands;Wash/dry face;Set up Grooming Details (indicate cue type and reason): simulated                 Toilet Transfer: Minimal assistance Toilet Transfer Details (indicate cue type and reason): simulated to recliner  General ADL Comments: declined self care tasks, simulated to address    Extremity/Trunk Assessment              Vision       Perception     Praxis      Cognition Arousal/Alertness: Awake/alert Behavior During Therapy: Flat  affect Overall Cognitive Status: No family/caregiver present to determine baseline cognitive functioning                                 General Comments: Follows simple commands with extra time.        Exercises Exercises: General Upper Extremity General Exercises - Upper Extremity Shoulder Flexion: AROM, 10 reps, Right Shoulder ABduction: AROM, Right, 10 reps Elbow Flexion: AROM, Right, 10 reps Elbow Extension: AROM, Right, 10 reps Other Exercises Other Exercises: performed squeeze ball exercises for both hands    Shoulder Instructions       General Comments BP up in recliner 97/57, HR 87, SpO2 97    Pertinent Vitals/ Pain       Pain Assessment Pain Assessment: Faces Faces Pain Scale: Hurts little more Pain Location: both hands Pain Descriptors / Indicators: Grimacing, Sore Pain Intervention(s): Limited activity within patient's tolerance, Monitored during session, Repositioned  Home Living                                          Prior Functioning/Environment              Frequency  Min 2X/week        Progress Toward Goals  OT Goals(current goals can now be found in the care plan section)  Progress towards OT goals: Progressing toward goals  Acute Rehab OT Goals Patient Stated Goal: go home OT Goal Formulation: With patient Time For Goal Achievement: 02/01/23 Potential to Achieve Goals: Fair ADL Goals Pt Will Perform Eating: with modified independence;sitting;with adaptive utensils Pt Will Perform Grooming: with modified independence;standing Pt Will Perform Upper Body Bathing: with set-up;sitting Pt Will Perform Lower Body Bathing: with supervision;sit to/from stand Pt Will Perform Upper Body Dressing: with supervision;sitting Pt Will Perform Lower Body Dressing: with modified independence;sit to/from stand Pt Will Transfer to Toilet: with modified independence;regular height toilet;ambulating Pt Will Perform  Toileting - Clothing Manipulation and hygiene: with modified independence;sit to/from stand Pt/caregiver will Perform Home Exercise Program: Right Upper extremity;Increased strength;Increased ROM;With written HEP provided;Independently  Plan Discharge plan remains appropriate    Co-evaluation                 AM-PAC OT "6 Clicks" Daily Activity     Outcome Measure   Help from another person eating meals?: A Little Help from another person taking care of personal grooming?: A Little Help from another person toileting, which includes using toliet, bedpan, or urinal?: A Little Help from another person bathing (including washing, rinsing, drying)?: A Little Help from another person to put on and taking off regular upper body clothing?: A Little Help from another person to put on and taking off regular lower body clothing?: A Little 6 Click Score: 18    End of Session Equipment Utilized During Treatment: Gait belt  OT Visit Diagnosis: Unsteadiness on feet (R26.81);Muscle weakness (generalized) (M62.81) Pain - Right/Left: Left Pain - part of body: Hand   Activity Tolerance Patient tolerated treatment well  Patient Left in chair;with call bell/phone within reach   Nurse Communication Mobility status        Time: 2355-7322 OT Time Calculation (min): 25 min  Charges: OT General Charges $OT Visit: 1 Visit OT Treatments $Therapeutic Activity: 8-22 mins $Therapeutic Exercise: 8-22 mins  Lodema Hong, [place]  Office [phone]   Trixie Dredge 01/27/2023, 10:56 AM

## 2023-01-27 NOTE — Progress Notes (Signed)
   Subjective:  No acute events overnight.  Patient sitting comfortably in chair today, states he got himself into the chair without assistance and denies any dizziness.   Provided updates to patient's son yesterday.   Objective:  Vital signs in last 24 hours: Vitals:   01/27/23 0300 01/27/23 0500 01/27/23 0831 01/27/23 1206  BP: (!) 115/55     Pulse: 78     Resp: (!) 23     Temp: 97.7 F (36.5 C)  97.8 F (36.6 C) 98 F (36.7 C)  TempSrc: Axillary  Axillary Axillary  SpO2: 99%     Weight:  75.6 kg     Weight change: 0.6 kg  Intake/Output Summary (Last 24 hours) at 01/27/2023 1420 Last data filed at 01/27/2023 0300 Gross per 24 hour  Intake --  Output 1600 ml  Net -1600 ml   General: Alert, in no acute distress CV: Normal rate Pulm: Normal respiratory effort on room air Skin: Normal skin turgor, warm and dry. Neuro: Alert, conversational Psych: pleasant, appropriate affect  Assessment/Plan:  Principal Problem:   Anemia, posthemorrhagic, acute Active Problems:   Benign esophageal stricture   Severe malnutrition (HCC)   PEG (percutaneous endoscopic gastrostomy) status ([place])   Long term current use of anticoagulant therapy   Debility   Advanced care planning/counseling discussion   Protein-calorie malnutrition, severe  [name] is a 77 yo  with history of recent CVA s/p ICA stenting, severe esophageal stricture s/p multiple dilations, PEG tube placement in setting of recurrent aspiration pneumonia, protein-calorie malnutrition, and RA on chronic prednisone admitted to ICU 1/20 for acute blood loss from GIB on DAPT, today orthostatic hypotension is improving in the setting of adrenal insufficiency on hydrocortisone.   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 Cosyntropin test was  consistent with adrenal insufficiency, started hydrocortisone '15mg'$  yesterday but patient still had persistent orthostatic hypotension, hydrocortisone was increased to '25mg'$  starting today. Patient's standing BP was stable this morning when assessed in the room. Plan to add fludrocortisone and repeat orthostatic vitals, if hypotension persists could consider adding midodrine.  -Hydrocortisone '25mg'$  total in 2 divided doses per tube -Fludrocortisone 0.'1mg'$  per tube -Discontinue scopalamine -Orthostatic vitals  Hx of recent L MCA CVA s/p ICA stent    Patient on aspirin for ICA stent, not on DAPT due to high GI bleed risk. Patient has had recent waxing-waning confusion consistent with delirium in the setting of two months of continuous hospitalization, low concern for acute CVA. Patient's son reports patient has had episodes of delirium before with prior hospitalizations. Will continue to monitor.  -Aspirin '325mg'$  daily per tube -Delirium precautions  Acute blood loss anemia secondary to GI bleed, improving   Hemoglobin has been stable since transfer from ICU, will continue to monitor.  -IV Protonix '40mg'$   -Sucralfate via PEG q6h  Protein-calorie malnutrition PEG tube dependent 2/2 esophogeal stenosis and hx of recurrent aspiration PNA Discontinuing scopalamine for persistent orthostatic hypotension.  -Discontinue scopalamine -Tube feeds at 33m/hr for 18h -1561mfree water x6 daily  T2DM   CBG this morning 188, blood sugars have been stable this admission.  -Continue to monitor  Full Code Diet: TF 85 mL/h IVF: None VTE: None due to risk of GI bleeding    LOS: 12 days   NeSpero CurbMedical Student 01/27/2023, 2:20 PM

## 2023-01-27 NOTE — Progress Notes (Signed)
Mobility Specialist Progress Note   01/27/23 1525  Orthostatic Lying   BP- Lying 99/54  Orthostatic Sitting  BP- Sitting 98/49  Orthostatic Standing at 0 minutes  BP- Standing at 0 minutes  (Refused)  Mobility  Activity Transferred from bed to chair  Level of Assistance Contact guard assist, steadying assist  Assistive Device None;Other (Comment) (HHA)  Distance Ambulated (ft) 2 ft  Range of Motion/Exercises Active;All extremities  Activity Response Tolerated well   Patient received in supine and agreeable to participate. Obtained ortho VS with no significant change noted as listed above. Requested to put clothes on for normalcy, voiced his frustration with being here, not wearing underwear, etc. Able to donn clothes with minimal assistance. Was independent for bed mobility and stood with minimal HHA. Did require occasional min A while while static sitting at EOB secondary poor trunk strength. Tolerated without complaint or incident. Was left in recliner with all needs met, call bell in reach.   [name], BS EXP Mobility Specialist Please contact via SecureChat or Rehab office at [phone]

## 2023-01-28 DIAGNOSIS — Z95828 Presence of other vascular implants and grafts: Principal | ICD-10-CM

## 2023-01-28 DIAGNOSIS — E271 Primary adrenocortical insufficiency: Secondary | ICD-10-CM | POA: Insufficient documentation

## 2023-01-28 LAB — BASIC METABOLIC PANEL
Anion gap: 8 (ref 5–15)
BUN: 32 mg/dL — ABNORMAL HIGH (ref 8–23)
CO2: 25 mmol/L (ref 22–32)
Calcium: 8.5 mg/dL — ABNORMAL LOW (ref 8.9–10.3)
Chloride: 103 mmol/L (ref 98–111)
Creatinine, Ser: 0.98 mg/dL (ref 0.61–1.24)
GFR, Estimated: >60 mL/min (ref >60)
Glucose, Bld: 179 mg/dL — ABNORMAL HIGH (ref 70–99)
Potassium: 3.6 mmol/L (ref 3.5–5.1)
Sodium: 136 mmol/L (ref 135–145)

## 2023-01-28 LAB — GLUCOSE, CAPILLARY
Glucose-Capillary: 166 mg/dL — ABNORMAL HIGH (ref 70–99)
Glucose-Capillary: 167 mg/dL — ABNORMAL HIGH (ref 70–99)
Glucose-Capillary: 181 mg/dL — ABNORMAL HIGH (ref 70–99)
Glucose-Capillary: 186 mg/dL — ABNORMAL HIGH (ref 70–99)
Glucose-Capillary: 214 mg/dL — ABNORMAL HIGH (ref 70–99)

## 2023-01-28 LAB — MAGNESIUM: Magnesium: 1.6 mg/dL — ABNORMAL LOW (ref 1.7–2.4)

## 2023-01-28 MED ORDER — OSMOLITE 1.5 CAL PO LIQD: 1530.0000 mL | ORAL | 0 refills | Status: DC

## 2023-01-28 MED ORDER — MAGNESIUM SULFATE 2 GM/50ML IV SOLN
2.0000 g | Freq: Once | INTRAVENOUS | Status: AC
  Administered 2023-01-28: 2 g via INTRAVENOUS
  Filled 2023-01-28: qty 50

## 2023-01-28 MED ORDER — ASPIRIN 325 MG PO TABS: 325.0000 mg | ORAL_TABLET | Freq: Every day | ORAL | Status: DC

## 2023-01-28 MED ORDER — ATORVASTATIN CALCIUM 40 MG PO TABS: 40.0000 mg | ORAL_TABLET | Freq: Every day | ORAL | 11 refills | Status: DC

## 2023-01-28 MED ORDER — HYDROCORTISONE 10 MG PO TABS: 10.0000 mg | ORAL_TABLET | Freq: Every day | ORAL | Status: DC

## 2023-01-28 MED ORDER — FOLIC ACID 1 MG PO TABS: 1.0000 mg | ORAL_TABLET | Freq: Every day | ORAL | 11 refills | Status: DC

## 2023-01-28 MED ORDER — MAGNESIUM OXIDE -MG SUPPLEMENT 400 (240 MG) MG PO TABS: 400.0000 mg | ORAL_TABLET | Freq: Every day | ORAL | 11 refills | Status: DC

## 2023-01-28 MED ORDER — HYDROCORTISONE 5 MG PO TABS: 15.0000 mg | ORAL_TABLET | Freq: Every day | ORAL | Status: DC

## 2023-01-28 MED ORDER — FLUDROCORTISONE ACETATE 0.1 MG PO TABS: 0.1000 mg | ORAL_TABLET | Freq: Every day | ORAL | Status: DC

## 2023-01-28 MED ORDER — ALUM & MAG HYDROXIDE-SIMETH 200-200-20 MG/5ML PO SUSP: 30.0000 mL | Freq: Three times a day (TID) | ORAL | 0 refills | Status: DC | PRN

## 2023-01-28 MED ORDER — FREE WATER: 150.0000 mL | Freq: Every day | Status: DC

## 2023-01-28 MED ORDER — FERROUS SULFATE 300 (60 FE) MG/5ML PO SYRP: 300.0000 mg | ORAL_SOLUTION | Freq: Every day | ORAL | 3 refills | Status: DC

## 2023-01-28 MED ORDER — LEVOTHYROXINE SODIUM 50 MCG PO TABS: 50.0000 ug | ORAL_TABLET | Freq: Every day | ORAL | 11 refills | Status: DC

## 2023-01-28 MED ORDER — SUCRALFATE 1 GM/10ML PO SUSP: 1.0000 g | Freq: Four times a day (QID) | ORAL | 0 refills | Status: DC

## 2023-01-28 MED ORDER — ONDANSETRON HCL 4 MG PO TABS: 4.0000 mg | ORAL_TABLET | Freq: Two times a day (BID) | ORAL | 0 refills | Status: DC | PRN

## 2023-01-28 MED ORDER — PANTOPRAZOLE SODIUM 40 MG PO PACK: PACK | ORAL | Status: DC

## 2023-01-28 MED ORDER — METFORMIN HCL 500 MG PO TABS: 500.0000 mg | ORAL_TABLET | Freq: Every day | ORAL | 11 refills | Status: DC

## 2023-01-28 NOTE — Discharge Instructions (Addendum)
Discharge instructions for [name]. [place]:  [name] was admitted for upper GI bleeding and hemorrhagic shock requiring ventilatory and vasopressor support in the ICU.  He has high-grade esophageal stenosis preventing endoscopy for thorough assessment of upper GI bleeding.  He was treated with IV pantoprazole and blood transfusions.  His condition stabilized after few days and he was transferred out of the ICU.  His hospital course was complicated by orthostatic hypotension due to primary adrenal insufficiency.  Changes to his medications are outlined below.  All medication should be administered via gastrostomy tube.  Start taking: - Aspirin 325 mg daily - Fludrocortisone 0.1 mg daily - Hydrocortisone 15 mg daily before breakfast, 10 mg daily at 2 PM  I recommend these changes to the following medications: - Pantoprazole packets 40 mg twice daily per tube until 02/12/2023, then 40 mg once daily thereafter - Sucralfate 10 mL every 6 hours  For nutrition: - Osmolite 1.5 1530 mL daily at 85 mL/h for 18 hours from 1600 to 1000 - Free water 150 mL per tube 6 times daily  Stop taking: - Aspirin 81 mg - Famotidine - Oxycodone - Prednisone - Methotrexate - Tadalafil - Ticagrelor  Follow up with endocrinology for adrenal insufficiency.  Follow up with rheumatology before restarting prednisone and methotrexate, as these medicines can interact with your currently prescribed medications.  Follow up with gastro[facility]ology at [facility] for your esophageal stenosis and upper GI bleeding.

## 2023-01-28 NOTE — Progress Notes (Signed)
Physical Therapy Treatment Patient Details Name: BRISTON LAX MRN: [id number] DOB: 09-Jul-1946 Today's Date: 01/28/2023   History of Present Illness 77 y.o. male admitted 1/1 after found on floor.  OAC:[id number] foci of restricted diffusion left frontal lobe and left parietal lobe.  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  Pt (+) flu A and possible aspiration PNA. 1/5 code stroke with increased Rt weakness. 1/8 carotid stent in IR. Transfer to Rehab 1/12 and transfer back to Acute 1/20 due to GIB and VDRF.  Extubated 1/21.  New PT eval ordered 1/22.  PMHx: RA, ILD, DVT/PE, T2DM, HTN, HLD, Esophageal outlet obstructions s/p multiple dilatations and GJ tube placement for FTT, severe protein calorie malnutrition, AAA L Illiac artery aneurysm s/p EVAR    PT Comments    Pt agreeable to ambulating to and from the bathroom today, minA for standing from the toilet x1 trial required, min guard for ambulation. Pt is reliant on external support with standing balance, declining use of RW or HHA, reaching for support and utilizing IV pole. Pt without overt LOB but unsteadiness noted. Pt standing ~1 minute for hygiene with utilization of grab bar, requiring a seated rest break on the toilet prior to ambulating back to bed. Pt will continue to benefit from skilled acute PT at this time to progress mobility and balance, discharge recommendations remain appropriate.    Recommendations for follow up therapy are one component of a multi-disciplinary discharge planning process, led by the attending physician.  Recommendations may be updated based on patient status, additional functional criteria and insurance authorization.  Follow Up Recommendations  Skilled nursing-short term rehab (<3 hours/day) Can patient physically be transported by private vehicle: Yes   Assistance Recommended at Discharge Frequent or constant Supervision/Assistance  Patient can return home with the  following A little help with walking and/or transfers;A lot of help with bathing/dressing/bathroom;Assistance with cooking/housework;Direct supervision/assist for medications management;Assist for transportation;Assistance with feeding   Equipment Recommendations  Rolling walker (2 wheels)    Recommendations for Other Services       Precautions / Restrictions Precautions Precautions: Fall;Other (comment) Precaution Comments: NPO, PEG, +orthostatics Restrictions Weight Bearing Restrictions: No     Mobility  Bed Mobility               General bed mobility comments: pt seated in chair upon arrival, ended session with pt in chair    Transfers Overall transfer level: Needs assistance Equipment used:  (IV pole) Transfers: Sit to/from Stand Sit to Stand: Min assist           General transfer comment: minA for power up from toilet with use of grab bar x1 attempt, standing from toilet and chair with min guard with other attempts    Ambulation/Gait Ambulation/Gait assistance: Min guard Gait Distance (Feet): 12 Feet (x2 trials) Assistive device: IV Pole Gait Pattern/deviations: Step-through pattern, Decreased stride length, Trunk flexed, Drifts right/left Gait velocity: decreased     General Gait Details: path deviation and mild lateral sway, min guard provided for safety with pt reaching for UE support and utilizing IV pole, declining RW or HHA   Stairs             Wheelchair Mobility    Modified Rankin (Stroke Patients Only)       Balance Overall balance assessment: Needs assistance Sitting-balance support: Feet supported, No upper extremity supported Sitting balance-Leahy Scale: Good Sitting balance - Comments: stable balance sitting in chair   Standing balance support:  Single extremity supported, During functional activity Standing balance-Leahy Scale: Poor Standing balance comment: reliant on external support for balance, pt with min guard for  standing hygiene after toileting, utilizing grab bar for balance                            Cognition Arousal/Alertness: Awake/alert Behavior During Therapy: Flat affect Overall Cognitive Status: No family/caregiver present to determine baseline cognitive functioning                                 General Comments: follows commands with increased time        Exercises      General Comments General comments (skin integrity, edema, etc.): pt reports fatigue after ambulating back from the bathroom, SPO2 stable on room air      Pertinent Vitals/Pain Pain Assessment Pain Assessment: No/denies pain    Home Living                          Prior Function            PT Goals (current goals can now be found in the care plan section) Acute Rehab PT Goals Patient Stated Goal: go home PT Goal Formulation: With patient Time For Goal Achievement: 01/31/23 Potential to Achieve Goals: Fair Progress towards PT goals: Progressing toward goals    Frequency    Min 3X/week      PT Plan Current plan remains appropriate    Co-evaluation              AM-PAC PT "6 Clicks" Mobility   Outcome Measure  Help needed turning from your back to your side while in a flat bed without using bedrails?: A Little Help needed moving from lying on your back to sitting on the side of a flat bed without using bedrails?: A Little Help needed moving to and from a bed to a chair (including a wheelchair)?: A Little Help needed standing up from a chair using your arms (e.g., wheelchair or bedside chair)?: A Little Help needed to walk in hospital room?: A Little Help needed climbing 3-5 steps with a railing? : A Lot 6 Click Score: 17    End of Session   Activity Tolerance: Patient limited by fatigue Patient left: in chair;with call bell/phone within reach Nurse Communication: Mobility status PT Visit Diagnosis: Unsteadiness on feet (R26.81);Muscle weakness  (generalized) (M62.81);Difficulty in walking, not elsewhere classified (R26.2)     Time: 8466-5993 PT Time Calculation (min) (ACUTE ONLY): 14 min  Charges:  $Therapeutic Activity: 8-22 mins                     Charlynne Cousins, PT DPT Acute Rehabilitation Services Office [phone]    Luvenia Heller 01/28/2023, 1:41 PM

## 2023-01-28 NOTE — TOC Progression Note (Addendum)
Transition of Care [facility]) - Progression Note    Patient Details  Name: [name] MRN: [id number] Date of Birth: 01-May-1946  Transition of Care [facility]) CM/SW [place], LCSW Phone Number: 01/28/2023, 10:08 AM  Clinical Narrative:    10am-CSW confirmed with Freda Munro at [facility] that they are able to accept patient today. CSW updated patient's son. He wall call me back regarding if he can pick patient up this afternoon versus needing PTAR.  2:30pm-CSW confirmed with son that he is requesting PTAR for transport. CSW updated patient.   Expected Discharge Plan: [place] Barriers to Discharge: Barriers Resolved  Expected Discharge Plan and Services In-house Referral: Clinical Social Work   Post Acute Care Choice: [place] Living arrangements for the past 2 months: Apartment                                       Social Determinants of Health (SDOH) Interventions [place]: No Food Insecurity (12/28/2022)  Housing: Low Risk  (12/28/2022)  Transportation Needs: No Transportation Needs (12/28/2022)  Utilities: Not At Risk (12/28/2022)  Tobacco Use: Medium Risk (01/24/2023)    Readmission Risk Interventions    01/21/2023    9:13 AM  Readmission Risk Prevention Plan  Transportation Screening Complete  Medication Review (RN Care Manager) Complete  PCP or Specialist appointment within 3-5 days of discharge Complete  HRI or [place] Complete  SW Recovery Care/Counseling Consult Complete  Palliative Care Screening Not [place] Complete

## 2023-01-28 NOTE — Progress Notes (Signed)
Mobility Specialist Progress Note:   01/28/23 0935  Orthostatic Lying   BP- Lying 110/54  Pulse- Lying 73  Orthostatic Sitting  BP- Sitting 100/47  Pulse- Sitting 80  Orthostatic Standing at 0 minutes  BP- Standing at 0 minutes 93/45  Pulse- Standing at 0 minutes 80  Mobility  Activity Transferred from bed to chair  Level of Assistance Contact guard assist, steadying assist  Assistive Device None  Distance Ambulated (ft) 3 ft  Activity Response Tolerated well  Mobility Referral Yes  $Mobility charge 1 Mobility   Pt agreeable to mobility session. BP more stable today with negative orthostatics (as listed above). Pt states dizziness is much better, and is eager for d/c. Left in chair with all needs met.   Nelta Numbers Mobility Specialist Please contact via SecureChat or  Rehab office at [phone]

## 2023-01-28 NOTE — Discharge Summary (Signed)
Name: [name] MRN: [id number] DOB: Oct 09, 1946 77 y.o. PCP: Elaina Pattee, MD  Date of Admission: 01/15/2023  1:24 PM Date of Discharge: 01/28/2023 Attending Physician: Axel Filler, *  Discharge Diagnosis: 1. Principal Problem:   Anemia, posthemorrhagic, acute Active Problems:   Benign esophageal stricture   Severe malnutrition (HCC)   PEG (percutaneous endoscopic gastrostomy) status ([employer])   Long term current use of anticoagulant therapy   Debility   Advanced care planning/counseling discussion   Protein-calorie malnutrition, severe   Presence of internal carotid stent   Primary adrenal insufficiency ([place])   Discharge Medications: Allergies as of 01/28/2023   No Known Allergies      Medication List     STOP taking these medications    aspirin 81 MG chewable tablet Replaced by: aspirin 325 MG tablet   famotidine 20 MG tablet Commonly known as: PEPCID   Methotrexate 25 MG/ML Sosy   oxyCODONE 5 MG immediate release tablet Commonly known as: Oxy IR/ROXICODONE   pantoprazole 40 MG tablet Commonly known as: PROTONIX Replaced by: pantoprazole sodium 40 mg   predniSONE 10 MG tablet Commonly known as: DELTASONE   tadalafil 5 MG tablet Commonly known as: CIALIS   ticagrelor 90 MG Tabs tablet Commonly known as: BRILINTA       TAKE these medications    abatacept 250 MG injection Commonly known as: ORENCIA Inject 250 mg into the vein every 30 (thirty) days. What changed: how much to take   alum & mag hydroxide-simeth 200-200-20 MG/5ML suspension Commonly known as: MAALOX/MYLANTA Place 30 mLs into feeding tube 3 (three) times daily as needed for indigestion or heartburn. What changed: how to take this   aspirin 325 MG tablet Place 1 tablet (325 mg total) into feeding tube daily. Replaces: aspirin 81 MG chewable tablet   atorvastatin 40 MG tablet Commonly known as: LIPITOR Place 1 tablet (40 mg total) into feeding tube  daily. What changed: how to take this   diclofenac Sodium 1 % Gel Commonly known as: VOLTAREN Apply 2 g topically 4 (four) times daily.   feeding supplement (OSMOLITE 1.5 CAL) Liqd Place 1,530 mLs into feeding tube daily. Run for 18 hours from 1600 to 1000 What changed:  how much to take when to take this additional instructions Another medication with the same name was removed. Continue taking this medication, and follow the directions you see here.   feeding supplement (PROSource TF20) liquid Place 60 mLs into feeding tube daily.   Feeding Supplies Kit 1 Box by Does not apply route daily. Patient will need pump and supplies for continuous tube feeds and 108m syringes for free water flushes   ferrous sulfate 300 (60 Fe) MG/5ML syrup Place 5 mLs (300 mg total) into feeding tube daily. What changed: how to take this   fludrocortisone 0.1 MG tablet Commonly known as: FLORINEF Place 1 tablet (0.1 mg total) into feeding tube daily. Start taking on: February 3, 25027  folic acid 1 MG tablet Commonly known as: FOLVITE Place 1 tablet (1 mg total) into feeding tube daily. What changed: how to take this   free water Soln Place 150 mLs into feeding tube 6 (six) times daily. What changed:  how much to take when to take this Another medication with the same name was removed. Continue taking this medication, and follow the directions you see here.   hydrocortisone 10 MG tablet Commonly known as: CORTEF Place 1 tablet (10 mg total) into feeding tube daily  at 2 PM.   hydrocortisone 5 MG tablet Commonly known as: CORTEF Place 3 tablets (15 mg total) into feeding tube daily before breakfast.   levothyroxine 50 MCG tablet Commonly known as: SYNTHROID Place 1 tablet (50 mcg total) into feeding tube daily. What changed: how to take this   lidocaine 2 % solution Commonly known as: XYLOCAINE Use as directed 15 mLs in the mouth or throat 3 (three) times daily as needed for mouth  pain (Indigestion).   magnesium oxide 400 (240 Mg) MG tablet Commonly known as: MAG-OX Place 1 tablet (400 mg total) into feeding tube daily. What changed: how to take this   metFORMIN 500 MG tablet Commonly known as: GLUCOPHAGE Place 1 tablet (500 mg total) into feeding tube daily. What changed: how to take this   ondansetron 4 MG tablet Commonly known as: ZOFRAN Place 1 tablet (4 mg total) into feeding tube every 12 (twelve) hours as needed for nausea or vomiting. What changed: how to take this   pantoprazole sodium 40 mg Commonly known as: PROTONIX Take 40 mg per tube twice daily until 02/12/23. Take 40 mg per tube once daily thereafter. Replaces: pantoprazole 40 MG tablet   sucralfate 1 GM/10ML suspension Commonly known as: CARAFATE Place 10 mLs (1 g total) into feeding tube every 6 (six) hours. What changed:  how to take this when to take this        Disposition and follow-up:   [name] was discharged from [facility] in Stable condition.  At the hospital follow up visit please address:  1. Acute GI Bleed EGDs this admission was not able to traverse past severe esophageal stenosis, failed to dilate and unable to identify source of bleed. GI bleed resolved spontaneously. Patient discharged with Protonix '40mg'$  BID for 2 weeks, then '40mg'$  once daily after. Please review regimen at follow-up and recommend follow-up with Gastroenterology.   2. 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 risk of recurrent GI bleed. Recommend repeat L carotid ultrasound in 3 months for signs of restenosis, around May 2024.   3. Orthostatic Hypertension 2/2 Adrenal Insufficiency  Patient developed orthostatic hypotension during admission secondary to adrenal insufficiency, was discharged with '25mg'$  hydrocortisone total daily and 0.'1mg'$   fludrocortisone. Please review regimen and draw monitoring labs at follow-up, recommend follow-up with Endocrinology. Referral placed.  4. PEG Tube Replacement Patient was admitted with G-J tube, but tube was displaced during admission and replaced with 18-Fr G-tube. Please consider if replacement back to G-J tube is needed.   5. Rheumatoid arthritis Not addressed during this admission. Prednisone was discontinued on admission because of upper GI bleeding. Methotrexate was held on discharge because of increased concentration of methotrexate and potential toxicity with concomitant proton pump inhibitors. Consider restarting at lower dose.  6.  Labs / imaging needed at time of follow-up: CBC, BMP, Magnesium, L carotid U/S in 3 months (May 2024)  7.  Pending labs/ test needing follow-up: None  Follow-up Appointments:  Contact information for follow-up providers     Andree Elk, Melba Coon, MD. Call.   Specialty: Family Medicine Why: Call for an appointment as soon as possible after leaving the hospital. Contact information: [place] [address] [phone]              Contact information for after-discharge care  Destination     [facility] Preferred SNF .   Service: Skilled Nursing Contact information: [address] [place] Hospital Course by problem list:  Acute GI Bleed on DAPT Acute Blood Loss Anemia Severe Esophageal Stenosis Patient was transferred to the ICU on 01/15/23 from [place] for large bloody bowel movements and blood in PEG tube while on DAPT with Brillinta and ASA. Patient was electively intubated for bedside EGD but endoscope was unable to traverse severe esophageal stricture. Patient received 3 units pRBCs total in the ICU. His hemoglobin remained stable and was extubated and transferred to our service. He was treated with 500 mg of  IV iron. A second EGD also found severe esophageal stenosis with failed attempt for balloon dilatation. Patient's hemoglobin remained stable with conservative management only for the remainder of admission with no evidence of further bleeding.  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 Interventional Radiology recommended single-antiplatelet regimen (Aspirin '325mg'$ ) to balance risk of stent restenosis vs high risk recurrence of GI bleed. There was no evidence of stent restenosis on repeat ultrasound. IR recommends 3 month carotid ultrasound follow-up.   Orthostatic Hypotension secondary to Adrenal Insufficency Rheumatoid Arthritis on chronic steroids Patient is on chronic prednisone for RA, which was held since onset of GI bleed. Patient was started on hydrocortisone and fludrocortisone this admission in the setting of orthostatic hypotension with a positive cosyntropin test.   Protein-calorie malnutrition PEG tube dependent Patient's G-J tube was dislodged during admission and was replaced with 18-Fr G tube due to supply availability. He continued to receive all feeds and medications through his G-tube with no complications.   Discharge Subjective: Patient was sitting comfortably in the chair this morning, denies dizziness while working with mobility specialist today. States he feels ready to go.   Discharge Exam:   BP (!) 100/53   Pulse 75   Temp (!) 95.6 F (35.3 C) (Axillary) Comment: Pt refused oral  Resp 20   Wt 75.6 kg   SpO2 99%   BMI 21.40 kg/m   General: Alert, chronically-ill appearing. In no acute distress.  CV: RRR, no murmurs, rubs, or gallops. Pulm: Normal respiratory effort on room air Skin: Warm and dry Neuro: Alert and oriented, conversational Psych: Pleasant, appropriate affect  Pertinent Labs, Studies, and Procedures:   -EGD on  01/15/23 and 01/20/23, severe esophageal stenosis preventing visualization of stomach -GJ tube replacement with G-tube 01/21/2023 -U/S of L Carotid < 50% stenosis of ICA stent     Latest Ref Rng & Units 01/26/2023    5:31 AM 01/25/2023    4:39 AM 01/24/2023    2:34 AM  CBC  WBC 4.0 - 10.5 K/uL 6.8  7.0  6.5   Hemoglobin 13.0 - 17.0 g/dL 8.4  8.8  8.8   Hematocrit 39.0 - 52.0 % 25.8  27.0  27.6   Platelets 150 - 400 K/uL 255  258  245        Latest Ref Rng & Units 01/28/2023    6:52 AM 01/21/2023    6:21 AM 01/20/2023    4:41 AM  BMP  Glucose 70 - 99 mg/dL 179  178  132   BUN 8 -  23 mg/dL 32  23  21   Creatinine 0.61 - 1.24 mg/dL 0.98  0.91  0.77   Sodium 135 - 145 mmol/L 136  136  136   Potassium 3.5 - 5.1 mmol/L 3.6  3.8  3.8   Chloride 98 - 111 mmol/L 103  103  103   CO2 22 - 32 mmol/L '25  23  25   '$ Calcium 8.9 - 10.3 mg/dL 8.5  8.4  8.4     Discharge Instructions     Ambulatory referral to Endocrinology   Complete by: As directed    Primary adrenal insufficiency   Diet general   Complete by: As directed    For nutrition via gastrostomy tube: - Osmolite 1.5 1530 mL daily at 85 mL/h for 18 hours from 1600 to 1000 - Free water 150 mL 6 times daily   Discharge instructions   Complete by: As directed    Discharge instructions for [name]. [place]:  [name] was admitted for upper GI bleeding and hemorrhagic shock requiring ventilatory and vasopressor support in the ICU.  He has high-grade esophageal stenosis preventing endoscopy for thorough assessment of upper GI bleeding.  He was treated with IV pantoprazole and blood transfusions.  His condition stabilized after few days and he was transferred out of the ICU.  His hospital course was complicated by orthostatic hypotension due to primary adrenal insufficiency.  Changes to his medications are outlined below.  All medication should be administered via gastrostomy tube.  Start taking: - Aspirin 325 mg daily -  Fludrocortisone 0.1 mg daily - Hydrocortisone 15 mg daily before breakfast, 10 mg daily at 2 PM  I recommend these changes to the following medications: - Pantoprazole 40 mg twice daily until 02/12/2023, then 40 mg once daily thereafter - Sucralfate 10 mL every 6 hours  For nutrition: - Osmolite 1.5 1530 mL daily at 85 mL/h for 18 hours from 1600 to 1000 - Free water 150 mL per tube 6 times daily  Stop taking: - Aspirin 81 mg - Famotidine - Oxycodone - Prednisone - Tadalafil - Ticagrelor   Increase activity slowly   Complete by: As directed         Discharge Instructions      Discharge instructions for Mr. Sava Proby. Johnson:  [name] was admitted for upper GI bleeding and hemorrhagic shock requiring ventilatory and vasopressor support in the ICU.  He has high-grade esophageal stenosis preventing endoscopy for thorough assessment of upper GI bleeding.  He was treated with IV pantoprazole and blood transfusions.  His condition stabilized after few days and he was transferred out of the ICU.  His hospital course was complicated by orthostatic hypotension due to primary adrenal insufficiency.  Changes to his medications are outlined below.  All medication should be administered via gastrostomy tube.  Start taking: - Aspirin 325 mg daily - Fludrocortisone 0.1 mg daily - Hydrocortisone 15 mg daily before breakfast, 10 mg daily at 2 PM  I recommend these changes to the following medications: - Pantoprazole packets 40 mg twice daily per tube until 02/12/2023, then 40 mg once daily thereafter - Sucralfate 10 mL every 6 hours  For nutrition: - Osmolite 1.5 1530 mL daily at 85 mL/h for 18 hours from 1600 to 1000 - Free water 150 mL per tube 6 times daily  Stop taking: - Aspirin 81 mg - Famotidine - Oxycodone - Prednisone - Methotrexate - Tadalafil - Ticagrelor  Follow up with endocrinology for adrenal insufficiency.  Follow up with rheumatology before restarting  prednisone and methotrexate, as these medicines can interact with your currently prescribed medications.  Follow up with gastroenterology at [facility] for your esophageal stenosis and upper GI bleeding.    Signed: Nani Gasser, MD 01/28/2023, 2:01 PM   Pager: [phone]

## 2023-01-28 NOTE — TOC Transition Note (Signed)
Transition of Care [facility]) - CM/SW Discharge Note   Patient Details  Name: [name] MRN: [id number] Date of Birth: 1946-10-09  Transition of Care [facility]) CM/SW Contact:  Benard Halsted, LCSW Phone Number: 01/28/2023, 3:43 PM   Clinical Narrative:    Patient will DC to: [facility] Anticipated DC date: 01/28/23 Family notified: Son, [name] Transport by: Corey Harold   Per MD patient ready for DC to [facility]. RN to call report prior to discharge [phone]). RN, patient, patient's family, and facility notified of DC. Discharge Summary and FL2 sent to facility. DC packet on chart. Ambulance transport requested for patient.   CSW will sign off for now as social work intervention is no longer needed. Please consult [place] again if new needs arise.     Final next level of care: Skilled Nursing Facility Barriers to Discharge: Barriers Resolved   Patient Goals and CMS Choice CMS Medicare.gov Compare Post Acute Care list provided to:: Patient Represenative (must comment) Choice offered to / list presented to : Adult Children, Patient  Discharge Placement     Existing PASRR number confirmed : 01/28/23          Patient chooses bed at: [facility] Patient to be transferred to facility by: [place] Name of family member notified: Son Patient and family notified of of transfer: 01/28/23  Discharge Plan and Services Additional resources added to the After Visit Summary for   In-house Referral: Clinical Social Work   Post Acute Care Choice: [place]                               Social Determinants of Health (SDOH) Interventions [place]: No Food Insecurity (12/28/2022)  Housing: Low Risk  (12/28/2022)  Transportation Needs: No Transportation Needs (12/28/2022)  Utilities: Not At Risk (12/28/2022)  Tobacco Use: Medium Risk (01/24/2023)     Readmission Risk Interventions    01/21/2023    9:13 AM  Readmission Risk Prevention  Plan  Transportation Screening Complete  Medication Review ([place]) Complete  PCP or Specialist appointment within 3-5 days of discharge Complete  HRI or [place] Complete  SW Recovery Care/Counseling Consult Complete  Palliative Care Screening Not [place] Complete

## 2023-02-01 ENCOUNTER — Non-Acute Institutional Stay: Payer: Medicare Other | Admitting: Hospice

## 2023-02-01 DIAGNOSIS — K209 Esophagitis, unspecified without bleeding: Secondary | ICD-10-CM

## 2023-02-01 DIAGNOSIS — E43 Unspecified severe protein-calorie malnutrition: Secondary | ICD-10-CM

## 2023-02-01 DIAGNOSIS — R1013 Epigastric pain: Secondary | ICD-10-CM

## 2023-02-01 DIAGNOSIS — Z515 Encounter for palliative care: Secondary | ICD-10-CM

## 2023-02-01 DIAGNOSIS — R1314 Dysphagia, pharyngoesophageal phase: Secondary | ICD-10-CM

## 2023-02-01 NOTE — Progress Notes (Signed)
[facility] Consult Note Telephone: [phone]  Fax: [phone]   Date of encounter: 02/01/23 2:26 PM PATIENT NAME: [name] [address] [place] [address]   [id number] (home)  DOB: [birth year] MRN: [id number] PRIMARY CARE PROVIDER:    Christoper Allegra, MD--[facility]  REFERRING PROVIDER:   Madison Hickman, MD  RESPONSIBLE PARTY:  Self/[name]  Contact Information     Name Relation Home Work Mobile   [place] Son   [phone]   Ojani, Berenson   [phone]        I met face to face with patient and family in [facility]. Palliative Care was asked to follow this patient by consultation request of  Andree Elk Melba Coon, MD to address advance care planning and complex medical decision making.  This is palliative initial consult post Recent hospitalization  1/20-01/28/2023.  NP called son [name] and left him a voicemail with callback number.                                   ASSESSMENT AND PLAN / RECOMMENDATIONS:   Advance Care Planning/Goals of Care: Goals include to maximize quality of life and symptom management. Patient/health care surrogate gave his/her permission to discuss.Our advance care planning conversation included a discussion about:    The value and importance of advance care planning  Exploration of goals of care in the event of a sudden injury or illness  Identification  of a healthcare agent  Review and updating or creation of an  advance directive document . Decision not to resuscitate or to de-escalate disease focused treatments due to poor prognosis.  CODE STATUS: FULL CODE     I spent 16  minutes providing this initial consultation. More than 50% of the time in this consultation was spent on counseling patient and coordinating  communication. --------------------------------------------------------------------------------------------------------------------------------------  Symptom Management/Plan: Esophagitis/dysphagia, pharyngoesophageal phase: esophageal stenosis prevents patient from taking oral intake.  Nutrition via PEG tube continue omeprazole as ordered.  Recent EGD for esophageal stricture/GI bleed.  Followed by GI physicians at [facility]. Follow up as planned. Aspiration precautions. GI bleed: resolved with supportive care per Epic discharge report. No report of GI bleed today.   Anemia: Post hemorrhagic anemia.  Continue ferrous sulfate as ordered. Abdominal pain, epigastric: Continue carafate as ordered.  Severe protein-calorie malnutrition: Currently on Osmolite 1.5 via peg tube at 65 mL/hr. Weight on file 162.8 Ibs. Albumin 2.3 12/31/2022, 3.1 08/18/22. Weight weekly x 4. Routine CBC CMP.   Follow up Palliative Care Visit: Palliative care will continue to follow for complex medical decision making, advance care planning, and clarification of goals.   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 year old male  with multiple morbidities requiring close monitoring, with high risk of complications and mortality: Esophagitis/dysphagia pharyngoesophageal phase, recent EGD for esophageal stricture/GI bleed, severe protein caloric malnutrition, epigastric pain.  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  History obtained from review of EMR, discussion with primary team, and interview with family, facility staff/caregiver and/or Mr. Dinino.  I reviewed  available labs, medications, imaging,  studies and related documents from the EMR.  Records reviewed and summarized above.    Physical Exam: There were no vitals filed for this visit. There is no height or weight on file to calculate BMI. Wt Readings from Last 500 Encounters:  01/27/23 166 lb 10.7 oz (75.6 kg)  01/14/23 158 lb 15.2 oz (72.1 kg)  01/06/23 166 lb 14.2 oz (75.7 kg)  08/19/22 180 lb (81.6 kg)  01/26/21 177 lb 7.5 oz (80.5 kg)  07/31/20 201 lb 6.2 oz (91.3 kg)  02/01/20 196 lb 3.4 oz (89 kg)  08/10/19 190 lb 4.1 oz (86.3 kg)  02/23/19 184 lb 8.4 oz (83.7 kg)  05/13/16 181 lb 12.8 oz (82.5 kg)  01/16/16 179 lb 10.8 oz (81.5 kg)  07/25/15 163 lb 14.6 oz (74.3 kg)  02/06/15 200 lb (90.7 kg)  07/23/14 207 lb (93.9 kg)  05/23/14 200 lb (90.7 kg)  08/10/13 199 lb (90.3 kg)  07/31/13 200 lb 6.4 oz (90.9 kg)  08/15/12 175 lb 14.4 oz (79.8 kg)     CURRENT PROBLEM LIST:  Patient Active Problem List   Diagnosis Date Noted   Presence of internal carotid stent 01/28/2023   Primary adrenal insufficiency ([place]) 01/28/2023   Protein-calorie malnutrition, severe 01/21/2023   Excessive oral secretions 01/19/2023   PEG (percutaneous endoscopic gastrostomy) status ([place]) 01/17/2023   Long term current use of anticoagulant therapy 01/17/2023   Debility 01/17/2023   Advanced care planning/counseling discussion 01/17/2023   Antiplatelet or antithrombotic long-term use 01/15/2023   Anemia, posthemorrhagic, acute 01/15/2023   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/06/2023   Severe malnutrition ([place]) 12/30/2022   Sepsis without acute organ dysfunction ([place]) 12/28/2022   Influenza A 12/28/2022   Cerebrovascular accident (CVA) ([place]) 12/28/2022   Acute encephalopathy 12/27/2022   Acute esophagitis    Abdominal pain, epigastric    Closed left fibular fracture 08/22/2022   Reflux esophagitis 08/22/2022   Pressure injury of skin 08/20/2022   Upper GI bleed  08/19/2022   UGIB (upper gastrointestinal bleed) 08/18/2022   Pancytopenia ([place])    Immunosuppression ([name])    ILD (interstitial lung disease) ([place])    Diarrhea    DIC (disseminated intravascular coagulation) ([place])    AKI (acute kidney injury) ([place])    COVID-19 01/26/2021   Rheumatoid arthritis ([place]) 07/22/2015   Mucositis due to chemotherapy 06/25/2015   Mucositis due to radiation therapy 06/25/2015   SBO (small bowel obstruction) ([place]) 05/29/2015   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   H/O drug therapy 05/12/2015   Abnormal mental state 04/22/2015   Fast heart beat 04/22/2015   Febrile 04/18/2015   Candida esophagitis ([place]) 04/16/2015   Dysphagia, pharyngoesophageal phase 04/16/2015   Cancer of base of tongue ([place]) 04/02/2015   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[place]) 02/15/2015   Fall 05/24/2014   Traumatic pneumothorax 05/23/2014   Diabetes mellitus, type 2 ([place]) 05/01/2014   Perirectal abscess 07/31/2013   Hemorrhoids 07/31/2013   Lentigines 11/13/2012   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   Atypical nevus 08/29/2012   Malignant melanoma in situ ([place]) 08/29/2012   Polypharmacy 08/23/2012   Dyspnea 08/14/2012   Rheumatoid arthritis(714.0) 08/14/2012   HTN (hypertension) 08/14/2012   GERD (gastroesophageal reflux disease) 08/14/2012   Emphysema 08/14/2012   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   Duodenal ulcer with hemorrhage 10/15/2011   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   Benign esophageal stricture 10/15/2011   PAST MEDICAL HISTORY:  Active Ambulatory Problems    Diagnosis Date Noted   Dyspnea  08/14/2012   Rheumatoid arthritis(714.0) 08/14/2012   HTN (hypertension) 08/14/2012   GERD (gastroesophageal reflux disease) 08/14/2012   Emphysema 08/14/2012   Perirectal abscess 07/31/2013   Hemorrhoids 07/31/2013   Traumatic pneumothorax 05/23/2014   Fall 05/24/2014   Rheumatoid arthritis ([place]) 07/22/2015   Abnormal mental state 04/22/2015   Candida esophagitis ([place])  04/16/2015   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   Duodenal ulcer with hemorrhage 10/15/2011   Atypical nevus 08/29/2012   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   Benign esophageal stricture 10/15/2011   Febrile 04/18/2015   Lentigines 11/13/2012   Polypharmacy 08/23/2012   Cancer of base of tongue ([place]) 04/02/2015   Malignant melanoma in situ ([place]) 08/29/2012   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[place]) 02/15/2015   Mucositis due to chemotherapy 06/25/2015   Mucositis due to radiation therapy 06/25/2015   Dysphagia, pharyngoesophageal phase 04/16/2015   H/O drug therapy 05/12/2015   SBO (small bowel obstruction) ([place]) 05/29/2015   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   Fast heart beat 04/22/2015   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   Diabetes mellitus, type 2 ([place]) 05/01/2014   COVID-19 01/26/2021   AKI (acute kidney injury) ([place])    Pancytopenia ([place])    Immunosuppression ([place])    ILD (interstitial lung disease) ([place])    Diarrhea    DIC (disseminated intravascular coagulation) ([place])    UGIB (upper gastrointestinal bleed) 08/18/2022   Upper GI bleed 08/19/2022   Pressure injury of skin 08/20/2022   Closed left fibular fracture 08/22/2022   Reflux esophagitis 08/22/2022   Abdominal pain, epigastric    Acute esophagitis    Acute encephalopathy 12/27/2022   Sepsis without acute organ dysfunction ([place]) 12/28/2022   Influenza A 12/28/2022   Cerebrovascular accident (CVA) ([place]) 12/28/2022   Severe malnutrition ([place]) 12/30/2022   Acute ischemic left middle cerebral artery (MCA) stroke ([place]) 01/06/2023   Antiplatelet or antithrombotic long-term use 01/15/2023   Anemia, posthemorrhagic, acute 01/15/2023   PEG (percutaneous endoscopic gastrostomy) status ([place]) 01/17/2023   Long term current use of anticoagulant therapy 01/17/2023   Debility 01/17/2023   Advanced care planning/counseling discussion 01/17/2023   Excessive oral secretions 01/19/2023   Protein-calorie malnutrition, severe  01/21/2023   Presence of internal carotid stent 01/28/2023   Primary adrenal insufficiency ([place]) 01/28/2023   Resolved Ambulatory Problems    Diagnosis Date Noted   Hematemesis with nausea    ABLA (acute blood loss anemia) 01/15/2023   Hematemesis 01/15/2023   On mechanically assisted ventilation ([place]) 01/16/2023   Acute respiratory failure with hypoxia ([place]) 01/16/2023   Acute upper gastrointestinal bleeding 01/17/2023   Excessive oral intake 01/19/2023   Past Medical History:  Diagnosis Date   Aortic aneurysm ([place]) 05/2017   Chronic back pain    Diabetes mellitus without complication (HCC)    Dyslipidemia    H/O hiatal hernia    Hypertension    Melanoma ([place])    PONV (postoperative nausea and vomiting)    Spasm of esophagus    SOCIAL HX:  Social History   Tobacco Use   Smoking status: Former    Packs/day: 1.00    Years: 46.00    Total pack years: 46.00    Types: Cigarettes    Quit date: 11/26/2005    Years since quitting: 17.1   Smokeless tobacco: Never  Substance Use Topics   Alcohol use: Yes    Alcohol/week: 9.0 standard drinks of alcohol    Types: 9 Shots of liquor per week    Comment: 05/23/2014 "3, 1 shot drinks maybe  3 times/wk"   FAMILY HX:  Family History  Problem Relation Age of Onset   Heart disease Mother    Heart disease Father    Cancer Maternal Grandmother       Thank you for the opportunity to participate in the care of Mr. Fairley.  The palliative care team will continue to follow. Please call our office at [phone] if we can be of additional assistance.   Teodoro Spray, NP

## 2023-03-19 ENCOUNTER — Other Ambulatory Visit: Payer: Self-pay | Admitting: Student

## 2023-05-16 ENCOUNTER — Emergency Department (HOSPITAL_COMMUNITY): Payer: Medicare Other

## 2023-05-16 ENCOUNTER — Other Ambulatory Visit: Payer: Self-pay

## 2023-05-16 ENCOUNTER — Encounter (HOSPITAL_COMMUNITY): Payer: Self-pay

## 2023-05-16 ENCOUNTER — Inpatient Hospital Stay (HOSPITAL_COMMUNITY)
Admission: EM | Admit: 2023-05-16 | Discharge: 2023-05-23 | DRG: 641 | Disposition: A | Discharge status: Skilled Nursing Facility | Payer: Medicare Other | Attending: Family Medicine | Admitting: Family Medicine

## 2023-05-16 DIAGNOSIS — Z7189 Other specified counseling: Secondary | ICD-10-CM | POA: Diagnosis not present

## 2023-05-16 DIAGNOSIS — G9340 Encephalopathy, unspecified: Secondary | ICD-10-CM | POA: Diagnosis not present

## 2023-05-16 DIAGNOSIS — Z8249 Family history of ischemic heart disease and other diseases of the circulatory system: Secondary | ICD-10-CM

## 2023-05-16 DIAGNOSIS — Z789 Other specified health status: Secondary | ICD-10-CM

## 2023-05-16 DIAGNOSIS — I69331 Monoplegia of upper limb following cerebral infarction affecting right dominant side: Secondary | ICD-10-CM

## 2023-05-16 DIAGNOSIS — G8929 Other chronic pain: Secondary | ICD-10-CM | POA: Diagnosis present

## 2023-05-16 DIAGNOSIS — Z7952 Long term (current) use of systemic steroids: Secondary | ICD-10-CM

## 2023-05-16 DIAGNOSIS — M069 Rheumatoid arthritis, unspecified: Secondary | ICD-10-CM | POA: Diagnosis present

## 2023-05-16 DIAGNOSIS — Z7984 Long term (current) use of oral hypoglycemic drugs: Secondary | ICD-10-CM

## 2023-05-16 DIAGNOSIS — R410 Disorientation, unspecified: Secondary | ICD-10-CM

## 2023-05-16 DIAGNOSIS — C01 Malignant neoplasm of base of tongue: Secondary | ICD-10-CM | POA: Diagnosis present

## 2023-05-16 DIAGNOSIS — M549 Dorsalgia, unspecified: Secondary | ICD-10-CM | POA: Diagnosis present

## 2023-05-16 DIAGNOSIS — E785 Hyperlipidemia, unspecified: Secondary | ICD-10-CM | POA: Diagnosis present

## 2023-05-16 DIAGNOSIS — E871 Hypo-osmolality and hyponatremia: Secondary | ICD-10-CM | POA: Diagnosis not present

## 2023-05-16 DIAGNOSIS — I10 Essential (primary) hypertension: Secondary | ICD-10-CM | POA: Diagnosis present

## 2023-05-16 DIAGNOSIS — Z86718 Personal history of other venous thrombosis and embolism: Secondary | ICD-10-CM

## 2023-05-16 DIAGNOSIS — R627 Adult failure to thrive: Secondary | ICD-10-CM | POA: Diagnosis present

## 2023-05-16 DIAGNOSIS — J019 Acute sinusitis, unspecified: Secondary | ICD-10-CM | POA: Diagnosis present

## 2023-05-16 DIAGNOSIS — Z8673 Personal history of transient ischemic attack (TIA), and cerebral infarction without residual deficits: Secondary | ICD-10-CM

## 2023-05-16 DIAGNOSIS — Z8679 Personal history of other diseases of the circulatory system: Secondary | ICD-10-CM

## 2023-05-16 DIAGNOSIS — R32 Unspecified urinary incontinence: Secondary | ICD-10-CM | POA: Diagnosis present

## 2023-05-16 DIAGNOSIS — Z7982 Long term (current) use of aspirin: Secondary | ICD-10-CM

## 2023-05-16 DIAGNOSIS — Z87891 Personal history of nicotine dependence: Secondary | ICD-10-CM

## 2023-05-16 DIAGNOSIS — K222 Esophageal obstruction: Secondary | ICD-10-CM | POA: Diagnosis not present

## 2023-05-16 DIAGNOSIS — Z711 Person with feared health complaint in whom no diagnosis is made: Secondary | ICD-10-CM | POA: Diagnosis not present

## 2023-05-16 DIAGNOSIS — K2100 Gastro-esophageal reflux disease with esophagitis, without bleeding: Secondary | ICD-10-CM | POA: Diagnosis present

## 2023-05-16 DIAGNOSIS — Z6820 Body mass index (BMI) 20.0-20.9, adult: Secondary | ICD-10-CM

## 2023-05-16 DIAGNOSIS — Z515 Encounter for palliative care: Secondary | ICD-10-CM

## 2023-05-16 DIAGNOSIS — Z86711 Personal history of pulmonary embolism: Secondary | ICD-10-CM | POA: Diagnosis present

## 2023-05-16 DIAGNOSIS — D638 Anemia in other chronic diseases classified elsewhere: Secondary | ICD-10-CM | POA: Diagnosis present

## 2023-05-16 DIAGNOSIS — Z9049 Acquired absence of other specified parts of digestive tract: Secondary | ICD-10-CM

## 2023-05-16 DIAGNOSIS — Z923 Personal history of irradiation: Secondary | ICD-10-CM

## 2023-05-16 DIAGNOSIS — E039 Hypothyroidism, unspecified: Secondary | ICD-10-CM | POA: Diagnosis present

## 2023-05-16 DIAGNOSIS — Z79890 Hormone replacement therapy (postmenopausal): Secondary | ICD-10-CM

## 2023-05-16 DIAGNOSIS — Z79899 Other long term (current) drug therapy: Secondary | ICD-10-CM

## 2023-05-16 DIAGNOSIS — Z9221 Personal history of antineoplastic chemotherapy: Secondary | ICD-10-CM

## 2023-05-16 DIAGNOSIS — I69322 Dysarthria following cerebral infarction: Secondary | ICD-10-CM

## 2023-05-16 DIAGNOSIS — Z934 Other artificial openings of gastrointestinal tract status: Secondary | ICD-10-CM

## 2023-05-16 DIAGNOSIS — R4182 Altered mental status, unspecified: Secondary | ICD-10-CM

## 2023-05-16 DIAGNOSIS — E1165 Type 2 diabetes mellitus with hyperglycemia: Secondary | ICD-10-CM | POA: Diagnosis present

## 2023-05-16 DIAGNOSIS — F05 Delirium due to known physiological condition: Secondary | ICD-10-CM | POA: Diagnosis not present

## 2023-05-16 DIAGNOSIS — M0510 Rheumatoid lung disease with rheumatoid arthritis of unspecified site: Secondary | ICD-10-CM | POA: Diagnosis present

## 2023-05-16 DIAGNOSIS — Z85820 Personal history of malignant melanoma of skin: Secondary | ICD-10-CM

## 2023-05-16 DIAGNOSIS — E271 Primary adrenocortical insufficiency: Secondary | ICD-10-CM | POA: Diagnosis present

## 2023-05-16 DIAGNOSIS — J849 Interstitial pulmonary disease, unspecified: Secondary | ICD-10-CM | POA: Diagnosis present

## 2023-05-16 DIAGNOSIS — Z85810 Personal history of malignant neoplasm of tongue: Secondary | ICD-10-CM

## 2023-05-16 DIAGNOSIS — E876 Hypokalemia: Secondary | ICD-10-CM | POA: Diagnosis present

## 2023-05-16 DIAGNOSIS — R5381 Other malaise: Secondary | ICD-10-CM | POA: Diagnosis present

## 2023-05-16 LAB — COMPREHENSIVE METABOLIC PANEL
ALT: 16 U/L (ref 0–44)
AST: 20 U/L (ref 15–41)
Albumin: 3 g/dL — ABNORMAL LOW (ref 3.5–5.0)
Alkaline Phosphatase: 104 U/L (ref 38–126)
Anion gap: 13 (ref 5–15)
BUN: 24 mg/dL — ABNORMAL HIGH (ref 8–23)
CO2: 25 mmol/L (ref 22–32)
Calcium: 9.3 mg/dL (ref 8.9–10.3)
Chloride: 97 mmol/L — ABNORMAL LOW (ref 98–111)
Creatinine, Ser: 1.03 mg/dL (ref 0.61–1.24)
GFR, Estimated: >60 mL/min (ref >60)
Glucose, Bld: 142 mg/dL — ABNORMAL HIGH (ref 70–99)
Potassium: 3.8 mmol/L (ref 3.5–5.1)
Sodium: 135 mmol/L (ref 135–145)
Total Bilirubin: 0.5 mg/dL (ref 0.3–1.2)
Total Protein: 7.5 g/dL (ref 6.5–8.1)

## 2023-05-16 LAB — TSH: TSH: 7.026 u[IU]/mL — ABNORMAL HIGH (ref 0.350–4.500)

## 2023-05-16 LAB — CBC
HCT: 38.1 % — ABNORMAL LOW (ref 39.0–52.0)
HCT: 36.8 % — ABNORMAL LOW (ref 39.0–52.0)
Hemoglobin: 11.8 g/dL — ABNORMAL LOW (ref 13.0–17.0)
Hemoglobin: 11.7 g/dL — ABNORMAL LOW (ref 13.0–17.0)
MCH: 26.7 pg (ref 26.0–34.0)
MCH: 26.8 pg (ref 26.0–34.0)
MCHC: 31 g/dL (ref 30.0–36.0)
MCHC: 31.8 g/dL (ref 30.0–36.0)
MCV: 86.2 fL (ref 80.0–100.0)
MCV: 84.2 fL (ref 80.0–100.0)
Platelets: 212 10*3/uL (ref 150–400)
Platelets: 204 10*3/uL (ref 150–400)
RBC: 4.42 MIL/uL (ref 4.22–5.81)
RBC: 4.37 MIL/uL (ref 4.22–5.81)
RDW: 16.5 % — ABNORMAL HIGH (ref 11.5–15.5)
RDW: 16.3 % — ABNORMAL HIGH (ref 11.5–15.5)
WBC: 10.7 10*3/uL — ABNORMAL HIGH (ref 4.0–10.5)
WBC: 10.6 10*3/uL — ABNORMAL HIGH (ref 4.0–10.5)
nRBC: 0 % (ref 0.0–0.2)
nRBC: 0 % (ref 0.0–0.2)

## 2023-05-16 LAB — LACTIC ACID, PLASMA
Lactic Acid, Venous: 2.7 mmol/L (ref 0.5–1.9)
Lactic Acid, Venous: 1.3 mmol/L (ref 0.5–1.9)

## 2023-05-16 LAB — AMMONIA: Ammonia: <10 umol/L (ref 9–35)

## 2023-05-16 LAB — URINALYSIS, ROUTINE W REFLEX MICROSCOPIC
Bilirubin Urine: NEGATIVE
Glucose, UA: NEGATIVE mg/dL
Hgb urine dipstick: NEGATIVE
Ketones, ur: NEGATIVE mg/dL
Leukocytes,Ua: NEGATIVE
Nitrite: NEGATIVE
Protein, ur: NEGATIVE mg/dL
Specific Gravity, Urine: 1.017 (ref 1.005–1.030)
pH: 7 (ref 5.0–8.0)

## 2023-05-16 LAB — FOLATE: Folate: 26.7 ng/mL (ref >5.9)

## 2023-05-16 LAB — DIFFERENTIAL
Abs Immature Granulocytes: 0.04 10*3/uL (ref 0.00–0.07)
Basophils Absolute: 0.1 10*3/uL (ref 0.0–0.1)
Basophils Relative: 1 %
Eosinophils Absolute: 0.3 10*3/uL (ref 0.0–0.5)
Eosinophils Relative: 3 %
Immature Granulocytes: 0 %
Lymphocytes Relative: 5 %
Lymphs Abs: 0.6 10*3/uL — ABNORMAL LOW (ref 0.7–4.0)
Monocytes Absolute: 1 10*3/uL (ref 0.1–1.0)
Monocytes Relative: 9 %
Neutro Abs: 8.6 10*3/uL — ABNORMAL HIGH (ref 1.7–7.7)
Neutrophils Relative %: 82 %

## 2023-05-16 LAB — I-STAT CHEM 8, ED
BUN: 26 mg/dL — ABNORMAL HIGH (ref 8–23)
Calcium, Ion: 1.31 mmol/L (ref 1.15–1.40)
Chloride: 97 mmol/L — ABNORMAL LOW (ref 98–111)
Creatinine, Ser: 1.1 mg/dL (ref 0.61–1.24)
Glucose, Bld: 138 mg/dL — ABNORMAL HIGH (ref 70–99)
HCT: 37 % — ABNORMAL LOW (ref 39.0–52.0)
Hemoglobin: 12.6 g/dL — ABNORMAL LOW (ref 13.0–17.0)
Potassium: 4.1 mmol/L (ref 3.5–5.1)
Sodium: 139 mmol/L (ref 135–145)
TCO2: 28 mmol/L (ref 22–32)

## 2023-05-16 LAB — LIPASE, BLOOD: Lipase: 31 U/L (ref 11–51)

## 2023-05-16 LAB — PROTIME-INR
INR: 1.2 (ref 0.8–1.2)
Prothrombin Time: 15.2 seconds (ref 11.4–15.2)

## 2023-05-16 LAB — GLUCOSE, CAPILLARY: Glucose-Capillary: 119 mg/dL — ABNORMAL HIGH (ref 70–99)

## 2023-05-16 LAB — CK: Total CK: 21 U/L — ABNORMAL LOW (ref 49–397)

## 2023-05-16 LAB — PHOSPHORUS: Phosphorus: 3.1 mg/dL (ref 2.5–4.6)

## 2023-05-16 LAB — MAGNESIUM: Magnesium: 1.5 mg/dL — ABNORMAL LOW (ref 1.7–2.4)

## 2023-05-16 LAB — VITAMIN B12: Vitamin B-12: 864 pg/mL (ref 180–914)

## 2023-05-16 LAB — APTT: aPTT: 37 seconds — ABNORMAL HIGH (ref 24–36)

## 2023-05-16 MED ORDER — ONDANSETRON HCL 4 MG/2ML IJ SOLN: 4.0000 mg | Freq: Four times a day (QID) | INTRAMUSCULAR | Status: DC | PRN

## 2023-05-16 MED ORDER — SODIUM CHLORIDE 0.9 % IV BOLUS
500.0000 mL | Freq: Once | INTRAVENOUS | Status: AC
  Administered 2023-05-16: 500 mL via INTRAVENOUS

## 2023-05-16 MED ORDER — SUCRALFATE 1 GM/10ML PO SUSP
1.0000 g | Freq: Four times a day (QID) | ORAL | Status: DC
  Administered 2023-05-16 – 2023-05-23 (×23): 1 g
  Filled 2023-05-16 (×23): qty 10

## 2023-05-16 MED ORDER — OSMOLITE 1.5 CAL PO LIQD
1530.0000 mL | ORAL | Status: DC
  Filled 2023-05-16: qty 1659

## 2023-05-16 MED ORDER — MAGNESIUM SULFATE 2 GM/50ML IV SOLN
2.0000 g | Freq: Once | INTRAVENOUS | Status: AC
  Administered 2023-05-16: 2 g via INTRAVENOUS
  Filled 2023-05-16: qty 50

## 2023-05-16 MED ORDER — SODIUM CHLORIDE 0.9 % IV SOLN: INTRAVENOUS | Status: AC

## 2023-05-16 MED ORDER — HYDROCORTISONE 5 MG PO TABS
15.0000 mg | ORAL_TABLET | Freq: Every day | ORAL | Status: DC
  Administered 2023-05-17 – 2023-05-23 (×7): 15 mg
  Filled 2023-05-16 (×7): qty 1

## 2023-05-16 MED ORDER — ONDANSETRON HCL 4 MG PO TABS: 4.0000 mg | ORAL_TABLET | Freq: Four times a day (QID) | ORAL | Status: DC | PRN

## 2023-05-16 MED ORDER — SENNOSIDES-DOCUSATE SODIUM 8.6-50 MG PO TABS: 1.0000 | ORAL_TABLET | Freq: Every evening | ORAL | Status: DC | PRN

## 2023-05-16 MED ORDER — FLUDROCORTISONE ACETATE 0.1 MG PO TABS
0.1000 mg | ORAL_TABLET | Freq: Every day | ORAL | Status: DC
  Administered 2023-05-17 – 2023-05-23 (×7): 0.1 mg
  Filled 2023-05-16 (×7): qty 1

## 2023-05-16 MED ORDER — SODIUM CHLORIDE 0.9 % IV SOLN: INTRAVENOUS | Status: DC

## 2023-05-16 MED ORDER — DICLOFENAC SODIUM 1 % EX GEL
2.0000 g | Freq: Four times a day (QID) | CUTANEOUS | Status: DC
  Administered 2023-05-18 – 2023-05-23 (×11): 2 g via TOPICAL
  Filled 2023-05-16 (×2): qty 100

## 2023-05-16 MED ORDER — ACETAMINOPHEN 325 MG PO TABS: 650.0000 mg | ORAL_TABLET | Freq: Four times a day (QID) | ORAL | Status: DC | PRN

## 2023-05-16 MED ORDER — INSULIN ASPART 100 UNIT/ML IJ SOLN
0.0000 [IU] | INTRAMUSCULAR | Status: DC
  Administered 2023-05-17 (×3): 1 [IU] via SUBCUTANEOUS
  Administered 2023-05-18 – 2023-05-19 (×2): 2 [IU] via SUBCUTANEOUS
  Administered 2023-05-19 – 2023-05-20 (×5): 1 [IU] via SUBCUTANEOUS
  Administered 2023-05-20: 2 [IU] via SUBCUTANEOUS
  Administered 2023-05-20 – 2023-05-23 (×10): 1 [IU] via SUBCUTANEOUS

## 2023-05-16 MED ORDER — HYDROCORTISONE 10 MG PO TABS
10.0000 mg | ORAL_TABLET | Freq: Every day | ORAL | Status: DC
  Administered 2023-05-17 – 2023-05-22 (×6): 10 mg
  Filled 2023-05-16 (×7): qty 1

## 2023-05-16 MED ORDER — OXYCODONE HCL 5 MG PO TABS: 5.0000 mg | ORAL_TABLET | Freq: Four times a day (QID) | ORAL | Status: DC | PRN

## 2023-05-16 MED ORDER — LEVOTHYROXINE SODIUM 50 MCG PO TABS
50.0000 ug | ORAL_TABLET | Freq: Every day | ORAL | Status: DC
  Administered 2023-05-17 – 2023-05-18 (×2): 50 ug
  Filled 2023-05-16 (×2): qty 1

## 2023-05-16 MED ORDER — PANTOPRAZOLE SODIUM 40 MG IV SOLR
40.0000 mg | Freq: Every day | INTRAVENOUS | Status: DC
  Administered 2023-05-16 – 2023-05-17 (×2): 40 mg via INTRAVENOUS
  Filled 2023-05-16 (×2): qty 10

## 2023-05-16 MED ORDER — ACETAMINOPHEN 650 MG RE SUPP: 650.0000 mg | Freq: Four times a day (QID) | RECTAL | Status: DC | PRN

## 2023-05-16 MED ORDER — ENOXAPARIN SODIUM 40 MG/0.4ML IJ SOSY
40.0000 mg | PREFILLED_SYRINGE | INTRAMUSCULAR | Status: DC
  Administered 2023-05-16 – 2023-05-22 (×7): 40 mg via SUBCUTANEOUS
  Filled 2023-05-16 (×7): qty 0.4

## 2023-05-16 MED ORDER — FREE WATER
150.0000 mL | Freq: Every day | Status: DC
  Administered 2023-05-16 – 2023-05-19 (×16): 150 mL

## 2023-05-16 MED ORDER — ATORVASTATIN CALCIUM 40 MG PO TABS
40.0000 mg | ORAL_TABLET | Freq: Every day | ORAL | Status: DC
  Administered 2023-05-17: 40 mg
  Filled 2023-05-16: qty 1

## 2023-05-16 MED ORDER — OSMOLITE 1.5 CAL PO LIQD
1000.0000 mL | ORAL | Status: AC
  Administered 2023-05-16: 1000 mL
  Filled 2023-05-16: qty 1000

## 2023-05-16 NOTE — ED Notes (Signed)
ED TO INPATIENT HANDOFF REPORT  ED Nurse Name and Phone #: Grover Canavan [phone]  S Name/Age/Gender [name] 77 y.o. male Room/Bed: 023C/023C  Code Status   Code Status: Prior  Home/SNF/Other Skilled nursing facility Patient oriented to: self Is this baseline? Yes   Triage Complete: Triage complete  Chief Complaint Failure to thrive in adult [R62.7]  Triage Note Patient had primary MD visit this past Friday and had labs that are abnormal. Was called and told sodium level low and needed to go to ED- Sodium reported 126. Reports that he is weaker than normal   Allergies No Known Allergies  Level of Care/Admitting Diagnosis ED Disposition   ED Disposition: Admit Condition: None Comment: Hospital Area: [facility] [[id number]]  Level of Care: Med-Surg [16]  May place patient in observation at [facility] or [facility] if equivalent level of care is available:: Yes  Covid Evaluation: Asymptomatic - no recent exposure (last 10 days) testing not required  Diagnosis: Failure to thrive in adult [358490]  Admitting Physician: Briscoe Deutscher [[id number]]  Attending Physician: Briscoe Deutscher [[id number]]      B Medical/Surgery History Past Medical History:  Diagnosis Date   Aortic aneurysm (HCC) 05/2017   Chronic back pain    Diabetes mellitus without complication (HCC)    Dyslipidemia    GERD (gastroesophageal reflux disease)    H/O hiatal hernia    Hypertension    Melanoma (HCC)    "back; left arm"   PONV (postoperative nausea and vomiting)    Rheumatoid arthritis (HCC)    Spasm of esophagus    Past Surgical History:  Procedure Laterality Date   aneursym repair     aneursym repair and stent placement  05/2017   BIOPSY  08/19/2022   Procedure: BIOPSY;  Surgeon: Meryl Dare, MD;  Location: [facility] ENDOSCOPY;  Service: Gastroenterology;;   CARPAL TUNNEL RELEASE Bilateral    COLON SURGERY  2007   removed 18" of colon   ERCP W/ METAL STENT PLACEMENT   11/2005   Hattie Perch 12/14/2005   ESOPHAGOGASTRODUODENOSCOPY (EGD) WITH ESOPHAGEAL DILATION     "several times'   ESOPHAGOGASTRODUODENOSCOPY (EGD) WITH PROPOFOL N/A 08/19/2022   Procedure: ESOPHAGOGASTRODUODENOSCOPY (EGD) WITH PROPOFOL;  Surgeon: Meryl Dare, MD;  Location: [facility] ENDOSCOPY;  Service: Gastroenterology;  Laterality: N/A;   ESOPHAGOGASTRODUODENOSCOPY (EGD) WITH PROPOFOL N/A 01/15/2023   Procedure: ESOPHAGOGASTRODUODENOSCOPY (EGD) WITH PROPOFOL;  Surgeon: Benancio Deeds, MD;  Location: [facility] ENDOSCOPY;  Service: Gastroenterology;  Laterality: N/A;   ESOPHAGOGASTRODUODENOSCOPY (EGD) WITH PROPOFOL N/A 01/20/2023   Procedure: ESOPHAGOGASTRODUODENOSCOPY (EGD) WITH PROPOFOL;  Surgeon: Napoleon Form, MD;  Location: [facility] ENDOSCOPY;  Service: Gastroenterology;  Laterality: N/A;   HAND SURGERY Bilateral    "plastic knuckles"   IR ANGIO INTRA EXTRACRAN SEL COM CAROTID INNOMINATE UNI R MOD SED  01/03/2023   IR ANGIO VERTEBRAL SEL SUBCLAVIAN INNOMINATE UNI L MOD SED  01/03/2023   IR ANGIO VERTEBRAL SEL VERTEBRAL UNI R MOD SED  01/03/2023   IR INTRAVSC STENT CERV CAROTID W/EMB-PROT MOD SED INCL ANGIO  01/03/2023   IR REPLC GASTRO/COLONIC TUBE PERCUT W/FLUORO  01/21/2023   IR US GUIDE VASC ACCESS RIGHT  01/03/2023   LAPAROSCOPIC CHOLECYSTECTOMY  01/2006   MELANOMA EXCISION     "back; left arm"   NASAL SEPTUM SURGERY     PELVIC FRACTURE SURGERY  1964   "busted in 2 places"   RADIOLOGY WITH ANESTHESIA N/A 01/03/2023   Procedure: IR WITH ANESTHESIA;  Surgeon:  Radiologist, Medication, MD;  Location: [facility] OR;  Service: Radiology;  Laterality: N/A;   RESECTION DISTAL CLAVICAL Right 05/13/2016   Procedure: DISTAL CLAVICLE EXCISION;  Surgeon: Loreta Ave, MD;  Location: [facility];  Service: Orthopedics;  Laterality: Right;   SHOULDER ARTHROSCOPY WITH ROTATOR CUFF REPAIR AND SUBACROMIAL DECOMPRESSION Right 05/13/2016   Procedure: RIGHT SHOULDER SCOPE DEBRIDEMENT, ACROMIOPLASTY, ROTATOR  CUFF REPAIR; RELEASE BICEPS TENDON AND DEBRIDEMENT LABRUM;  Surgeon: Loreta Ave, MD;  Location: [facility];  Service: Orthopedics;  Laterality: Right;   TIBIA FRACTURE SURGERY Left 2006   "put a steel rod in it"     A IV Location/Drains/Wounds Patient Lines/Drains/Airways Status     Active Line/Drains/Airways     Name Placement date Placement time Site Days   Peripheral IV 05/16/23 20 G 1" Anterior;Right;Upper Arm 05/16/23  1459  Arm  less than 1   Gastrostomy/Enterostomy Gastrostomy 18 Fr. LUQ 01/21/23  1746  LUQ  115            Intake/Output Last 24 hours  Intake/Output Summary (Last 24 hours) at 05/16/2023 1936 Last data filed at 05/16/2023 1745 Gross per 24 hour  Intake 550 ml  Output no documentation  Net 550 ml    Labs/Imaging Results for orders placed or performed during the hospital encounter of 05/16/23 (from the past 48 hour(s))  Lipase, blood     Status: None   Collection Time: 05/16/23 10:52 AM  Result Value Ref Range   Lipase 31 11 - 51 U/L    Comment: Performed at [facility], [address] [place], [place] [address]  Comprehensive metabolic panel     Status: Abnormal   Collection Time: 05/16/23 10:52 AM  Result Value Ref Range   Sodium 135 135 - 145 mmol/L   Potassium 3.8 3.5 - 5.1 mmol/L   Chloride 97 (L) 98 - 111 mmol/L   CO2 25 22 - 32 mmol/L   Glucose, Bld 142 (H) 70 - 99 mg/dL    Comment: Glucose reference range applies only to samples taken after fasting for at least 8 hours.   BUN 24 (H) 8 - 23 mg/dL   Creatinine, Ser 8.29 0.61 - 1.24 mg/dL   Calcium 9.3 8.9 - 56.2 mg/dL   Total Protein 7.5 6.5 - 8.1 g/dL   Albumin 3.0 (L) 3.5 - 5.0 g/dL   AST 20 15 - 41 U/L   ALT 16 0 - 44 U/L   Alkaline Phosphatase 104 38 - 126 U/L   Total Bilirubin 0.5 0.3 - 1.2 mg/dL   GFR, Estimated >13 >08 mL/min    Comment: (NOTE) Calculated using the CKD-EPI Creatinine Equation (2021)    Anion gap 13 5 - 15    Comment: Performed at  [facility], [address] [place], [place] [address]  CBC     Status: Abnormal   Collection Time: 05/16/23 10:52 AM  Result Value Ref Range   WBC 10.7 (H) 4.0 - 10.5 K/uL   RBC 4.42 4.22 - 5.81 MIL/uL   Hemoglobin 11.8 (L) 13.0 - 17.0 g/dL   HCT 69.6 (L) 29.5 - 28.4 %   MCV 86.2 80.0 - 100.0 fL   MCH 26.7 26.0 - 34.0 pg   MCHC 31.0 30.0 - 36.0 g/dL   RDW 13.2 (H) 44.0 - 10.2 %   Platelets 212 150 - 400 K/uL   nRBC 0.0 0.0 - 0.2 %    Comment: Performed at [facility], [address] [place], [place] [address]  Ammonia     Status: None   Collection Time: 05/16/23  1:45 PM  Result Value Ref Range   Ammonia <10 9 - 35 umol/L    Comment: Performed at [facility], [address], [place], [place] [address]  Lactic acid, plasma     Status: Abnormal   Collection Time: 05/16/23  1:45 PM  Result Value Ref Range   Lactic Acid, Venous 2.7 (HH) 0.5 - 1.9 mmol/L    Comment: CRITICAL RESULT CALLED TO, READ BACK BY AND VERIFIED WITH COOK,J,RN 05/16/23 1503 SATRAIN,R Performed at [facility] Lab, [address], [place], [place] [address]   Protime-INR     Status: None   Collection Time: 05/16/23  1:45 PM  Result Value Ref Range   Prothrombin Time 15.2 11.4 - 15.2 seconds   INR 1.2 0.8 - 1.2    Comment: (NOTE) INR goal varies based on device and disease states. Performed at [facility], [address] [place], [place] [address]   APTT     Status: Abnormal   Collection Time: 05/16/23  1:45 PM  Result Value Ref Range   aPTT 37 (H) 24 - 36 seconds    Comment:        IF BASELINE aPTT IS ELEVATED, SUGGEST PATIENT RISK ASSESSMENT BE USED TO DETERMINE APPROPRIATE ANTICOAGULANT THERAPY. Performed at [facility], [address] [place], [place] [address]   Differential     Status: Abnormal   Collection Time: 05/16/23  1:45 PM  Result Value Ref Range   Neutrophils Relative % 82 %   Neutro Abs 8.6 (H) 1.7 - 7.7 K/uL   Lymphocytes Relative 5 %    Lymphs Abs 0.6 (L) 0.7 - 4.0 K/uL   Monocytes Relative 9 %   Monocytes Absolute 1.0 0.1 - 1.0 K/uL   Eosinophils Relative 3 %   Eosinophils Absolute 0.3 0.0 - 0.5 K/uL   Basophils Relative 1 %   Basophils Absolute 0.1 0.0 - 0.1 K/uL   Immature Granulocytes 0 %   Abs Immature Granulocytes 0.04 0.00 - 0.07 K/uL    Comment: Performed at [facility] Lab, [address], [place], [place] [address]  Magnesium     Status: Abnormal   Collection Time: 05/16/23  1:45 PM  Result Value Ref Range   Magnesium 1.5 (L) 1.7 - 2.4 mg/dL    Comment: Performed at [facility], [address], [place], [place] [address]  CBC     Status: Abnormal   Collection Time: 05/16/23  1:45 PM  Result Value Ref Range   WBC 10.6 (H) 4.0 - 10.5 K/uL   RBC 4.37 4.22 - 5.81 MIL/uL   Hemoglobin 11.7 (L) 13.0 - 17.0 g/dL   HCT 96.2 (L) 95.2 - 84.1 %   MCV 84.2 80.0 - 100.0 fL   MCH 26.8 26.0 - 34.0 pg   MCHC 31.8 30.0 - 36.0 g/dL   RDW 32.4 (H) 40.1 - 02.7 %   Platelets 204 150 - 400 K/uL   nRBC 0.0 0.0 - 0.2 %    Comment: Performed at [facility] Lab, [address] [place], [place] [address]  I-Stat Chem 8, ED     Status: Abnormal   Collection Time: 05/16/23  2:23 PM  Result Value Ref Range   Sodium 139 135 - 145 mmol/L   Potassium 4.1 3.5 - 5.1 mmol/L  Chloride 97 (L) 98 - 111 mmol/L   BUN 26 (H) 8 - 23 mg/dL   Creatinine, Ser 1.61 0.61 - 1.24 mg/dL   Glucose, Bld 096 (H) 70 - 99 mg/dL    Comment: Glucose reference range applies only to samples taken after fasting for at least 8 hours.   Calcium, Ion 1.31 1.15 - 1.40 mmol/L   TCO2 28 22 - 32 mmol/L   Hemoglobin 12.6 (L) 13.0 - 17.0 g/dL   HCT 04.5 (L) 40.9 - 81.1 %  Urinalysis, Routine w reflex microscopic -Urine, Clean Catch     Status: None   Collection Time: 05/16/23  5:55 PM  Result Value Ref Range   Color, Urine YELLOW YELLOW   APPearance CLEAR CLEAR   Specific Gravity, Urine 1.017 1.005 - 1.030   pH 7.0 5.0 - 8.0   Glucose, UA  NEGATIVE NEGATIVE mg/dL   Hgb urine dipstick NEGATIVE NEGATIVE   Bilirubin Urine NEGATIVE NEGATIVE   Ketones, ur NEGATIVE NEGATIVE mg/dL   Protein, ur NEGATIVE NEGATIVE mg/dL   Nitrite NEGATIVE NEGATIVE   Leukocytes,Ua NEGATIVE NEGATIVE    Comment: Performed at [facility] Lab, [address], [place], [place] [address]   CT HEAD WO CONTRAST  Result Date: 05/16/2023 CLINICAL DATA:  Mental status change of unknown cause. EXAM: CT HEAD WITHOUT CONTRAST TECHNIQUE: Contiguous axial images were obtained from the base of the skull through the vertex without intravenous contrast. RADIATION DOSE REDUCTION: This exam was performed according to the departmental dose-optimization program which includes automated exposure control, adjustment of the mA and/or kV according to patient size and/or use of iterative reconstruction technique. COMPARISON:  12/31/2022 FINDINGS: Brain: There is central cortical atrophy. Periventricular white matter changes are consistent with small vessel disease. There is no intra or extra-axial fluid collection or mass lesion. The basilar cisterns and ventricles have a normal appearance. There is no CT evidence for acute infarction or hemorrhage. Vascular: There is atherosclerotic calcification of the internal carotid arteries. No hyperdense vessels. Skull: Normal. Negative for fracture or focal lesion. Sinuses/Orbits: There is increased significant opacity involving the LEFT frontal, LEFT ethmoid, LEFT sphenoid, and LEFT maxillary sinus. Air-fluid levels are present. Findings are consistent with increased, acute sinusitis, likely superimposed on chronic changes. Small LEFT mastoid effusion. Other: None. IMPRESSION: 1. Atrophy and small vessel disease. 2. No evidence for acute intracranial abnormality. 3. Increased acute sinusitis, likely superimposed on chronic changes. Electronically Signed   By: Norva Pavlov M.D.   On: 05/16/2023 13:23   DG Chest 1 View  Result Date:  05/16/2023 CLINICAL DATA:  Altered mental status.  Shortness of breath. EXAM: CHEST  1 VIEW COMPARISON:  01/15/2023 FINDINGS: Heart size normal. Stable elevation of the RIGHT hemidiaphragm. Lungs are clear. Slight rotation towards the RIGHT. IMPRESSION: No active disease. Electronically Signed   By: Norva Pavlov M.D.   On: 05/16/2023 13:14    Pending Labs Unresulted Labs (From admission, onward)    None       Vitals/Pain Today's Vitals   05/16/23 1745 05/16/23 1800 05/16/23 1815 05/16/23 1830  BP: (Abnormal) 163/99     Pulse: 81     Resp: 17 20 20 17   Temp:      TempSrc:      SpO2: 99%     PainSc:        Isolation Precautions No active isolations  Medications Medications  0.9 %  sodium chloride infusion ( Intravenous New Bag/Given 05/16/23 1610)  magnesium sulfate IVPB 2  g 50 mL (0 g Intravenous Stopped 05/16/23 1744)  sodium chloride 0.9 % bolus 500 mL (0 mLs Intravenous Stopped 05/16/23 1745)    Mobility Unknown, he is altered mental status      Focused Assessments Neuro Assessment Handoff:  Swallow screen pass?  N/A         Neuro Assessment:   Neuro Checks:      Has TPA been given? No If patient is a Neuro Trauma and patient is going to OR before floor call report to 4N Charge nurse: [phone] or [phone]   R Recommendations: See Admitting Provider Note  Report given to:   Additional Notes: Alert to self

## 2023-05-16 NOTE — ED Notes (Addendum)
Alert, NAD, calm, interactive, resps e/u, no changes, son at [facility], IVF infusing, mag infusing. Pending/ encouraged urine sample. Son speaking with CM/SW on phone.

## 2023-05-16 NOTE — ED Notes (Signed)
EDP Dr. Jeraldine Loots into see, at [facility].

## 2023-05-16 NOTE — ED Provider Notes (Signed)
  Physical Exam  BP (!) 150/85 (BP Location: Left Arm)   Pulse 74   Temp 98.7 F (37.1 C) (Oral)   Resp 18   SpO2 100%   Physical Exam Vitals and nursing note reviewed.  Constitutional:      General: He is not in acute distress.    Appearance: He is well-developed. He is ill-appearing.  HENT:     Head: Normocephalic and atraumatic.     Nose: Nose normal.     Mouth/Throat:     Mouth: Mucous membranes are moist.     Pharynx: Oropharynx is clear.  Eyes:     Conjunctiva/sclera: Conjunctivae normal.  Cardiovascular:     Rate and Rhythm: Normal rate and regular rhythm.     Heart sounds: No murmur heard. Pulmonary:     Effort: Pulmonary effort is normal. No respiratory distress.     Breath sounds: Normal breath sounds.  Abdominal:     Palpations: Abdomen is soft.     Tenderness: There is no abdominal tenderness.  Musculoskeletal:        General: No swelling.     Cervical back: Neck supple.     Right lower leg: No edema.     Left lower leg: No edema.  Skin:    General: Skin is warm and dry.     Capillary Refill: Capillary refill takes less than 2 seconds.  Neurological:     General: No focal deficit present.     Mental Status: He is alert.     Comments: Awake and alert, frustrated and confused on questioning.  Psychiatric:        Mood and Affect: Mood normal.     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 Radiology: ordered.  Risk Prescription drug management. Decision regarding hospitalization.   Plan at handoff was to pursue palliative care and placement given complex medical  history and recent decline.  I discussion with patient and his son at bedside.  Son clarifies that a week ago he was able to take care of himself and was doing well at home.  However over the last week he has declined.  He has been more confused, and physically is unable to walk at this point.  Son is concerned that there is some sort reversible process causing this given it is acutely declined over the last week.  I am still waiting on his urinalysis.  Palliative care to discuss with the patient and son at bedside, patient also does not qualify for SNF.   Urinalysis reviewed, unremarkable.  His workup here is reassuring without clear reversible cause for his delirium and failure to thrive.  However given his acute course, I am concerned for underlying acute medical process as a cause of his symptoms.  I discussed the patient with the hospitalist service, and he was admitted for further management of his delirium and workup.  Patient and son were updated.      Fulton Reek, MD 05/16/23 Ouida Sills    Melene Plan, DO 05/17/23 1505

## 2023-05-16 NOTE — ED Triage Notes (Signed)
Patient had primary MD visit this past Friday and had labs that are abnormal. Was called and told sodium level low and needed to go to ED- Sodium reported 126. Reports that he is weaker than normal

## 2023-05-16 NOTE — ED Notes (Signed)
ED PA at BS 

## 2023-05-16 NOTE — ED Provider Notes (Signed)
[place] [facility] AT [facility] Provider Note   CSN: [id number] Arrival date & time: 05/16/23  1027     History  No chief complaint on file.   [name] is a 77 y.o. male.  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  He does have some residual dysarthria, right hand weakness from previous stroke.  Also has history of rheumatoid arthritis, interstitial lung disease.  Follows with rheumatology at [facility].  He is also dependent on nutrition from feeding tube due to esophageal stricture.  He presents with his son [name] for evaluation of decline in function over the past week.  Son states that typically he can ambulate and carry on a conversation without any issue.  Over the past week he has not been able to ambulate at all.  He has been confused.  He is oriented to self and is able to recognize his son.  He is not aware of the year, place.  Son states he is also defecating and urinating on himself.  He states this is not unusual when he is critically ill.  They recently had a PCP visit on Friday.  Had lab work done.  Today they were called due to concern of hyponatremia with sodium level of 126.  Patient has a complicated medical history.  Multiple admissions recently including at [facility].  Son states that patient and family are amenable to palliative care discussions.  Son at bedside provides most of the history due to mental status change.  The history is provided by the patient. No language interpreter was used.       Home Medications Prior to Admission medications   Medication Sig Start Date End Date Taking? Authorizing Provider  abatacept (ORENCIA) 250 MG injection Inject 250 mg into the vein every 30 (thirty) days. Patient taking differently: Inject 750 mg into the vein every 30 (thirty) days. 02/12/21   Evlyn Kanner, MD  alum & mag hydroxide-simeth (MAALOX/MYLANTA) 200-200-20 MG/5ML suspension Place 30 mLs into feeding tube 3 (three) times daily as needed for indigestion or heartburn. 01/28/23   Marrianne Mood, MD  aspirin 325 MG tablet Place 1 tablet (325 mg total) into feeding tube daily. 01/28/23   Marrianne Mood, MD  atorvastatin (LIPITOR) 40 MG tablet Place 1 tablet (40 mg total) into feeding tube daily. 01/28/23   Marrianne Mood, MD  diclofenac Sodium (VOLTAREN) 1 % GEL Apply 2 g topically 4 (four) times daily. Patient not taking: Reported on 12/28/2022 08/27/22   Marolyn Haller, MD  Feeding Supplies KIT 1 Box by Does not apply route daily. Patient will need pump and supplies for continuous tube feeds and 60mL syringes for free water flushes 12/31/22   Mapp, Gaylyn Cheers, MD  ferrous sulfate 300 (60 Fe) MG/5ML syrup Place 5 mLs (300 mg total) into feeding tube daily. 01/28/23   Marrianne Mood, MD  fludrocortisone (FLORINEF) 0.1 MG tablet Place 1 tablet (0.1 mg total) into feeding tube daily. 01/29/23   Marrianne Mood, MD  folic acid (FOLVITE) 1 MG tablet Place 1 tablet (1 mg total) into feeding tube daily. 01/28/23   Marrianne Mood, MD  hydrocortisone (CORTEF) 10 MG tablet Place 1 tablet (10 mg total) into feeding tube daily at 2 PM. 01/28/23   Marrianne Mood, MD  hydrocortisone (CORTEF) 5 MG tablet Place 3 tablets (15 mg total) into feeding  tube daily before breakfast. 01/28/23   Marrianne Mood, MD  levothyroxine (SYNTHROID) 50 MCG tablet Place 1 tablet (50 mcg total) into feeding tube daily. 01/28/23   Marrianne Mood, MD  lidocaine (XYLOCAINE) 2 % solution Use as directed 15 mLs in the mouth or throat 3 (three) times daily as needed for mouth pain (Indigestion). Patient not taking: Reported on 12/28/2022 08/27/22   Marolyn Haller, MD  magnesium oxide (MAG-OX) 400 (240 Mg) MG tablet Place 1 tablet (400 mg total) into feeding tube daily. 01/28/23   Marrianne Mood, MD  metFORMIN (GLUCOPHAGE) 500 MG  tablet Place 1 tablet (500 mg total) into feeding tube daily. 01/28/23   Marrianne Mood, MD  Nutritional Supplements (FEEDING SUPPLEMENT, OSMOLITE 1.5 CAL,) LIQD Place 1,530 mLs into feeding tube daily. Run for 18 hours from 1600 to 1000 01/28/23   Marrianne Mood, MD  ondansetron (ZOFRAN) 4 MG tablet Place 1 tablet (4 mg total) into feeding tube every 12 (twelve) hours as needed for nausea or vomiting. 01/28/23   Marrianne Mood, MD  pantoprazole sodium (PROTONIX) 40 mg Take 40 mg per tube twice daily until 02/12/23. Take 40 mg per tube once daily thereafter. 01/28/23   Marrianne Mood, MD  Protein (FEEDING SUPPLEMENT, PROSOURCE TF20,) liquid Place 60 mLs into feeding tube daily. 01/07/23   Mapp, Gaylyn Cheers, MD  sucralfate (CARAFATE) 1 GM/10ML suspension Place 10 mLs (1 g total) into feeding tube every 6 (six) hours. 01/28/23   Marrianne Mood, MD  Water For Irrigation, Sterile (FREE WATER) SOLN Place 150 mLs into feeding tube 6 (six) times daily. 01/28/23   Marrianne Mood, MD      Allergies    Patient has no known allergies.    Review of Systems   Review of Systems  Unable to perform ROS: Mental status change  Constitutional:  Negative for chills and fever.  Gastrointestinal:  Negative for abdominal pain and vomiting.  Neurological:  Positive for weakness. Negative for speech difficulty.  All other systems reviewed and are negative.   Physical Exam Updated Vital Signs BP 120/68   Pulse 85   Temp 97.9 F (36.6 C)   Resp 18   SpO2 98%  Physical Exam Vitals and nursing note reviewed.  Constitutional:      General: He is not in acute distress.    Appearance: Normal appearance. He is not ill-appearing.  HENT:     Head: Normocephalic and atraumatic.     Nose: Nose normal.  Eyes:     Conjunctiva/sclera: Conjunctivae normal.  Cardiovascular:     Rate and Rhythm: Normal rate and regular rhythm.  Pulmonary:     Effort: Pulmonary effort is normal. No respiratory distress.  Abdominal:      General: There is no distension.     Palpations: Abdomen is soft.     Tenderness: There is no abdominal tenderness. There is no guarding.  Musculoskeletal:        General: No deformity.  Skin:    Findings: No rash.  Neurological:     Mental Status: He is alert.     ED Results / Procedures / Treatments   Labs (all labs ordered are listed, but only abnormal results are displayed) Labs Reviewed  COMPREHENSIVE METABOLIC PANEL - Abnormal; Notable for the following components:      Result Value   Chloride 97 (*)    Glucose, Bld 142 (*)    BUN 24 (*)    Albumin 3.0 (*)    All other components within normal  limits  CBC - Abnormal; Notable for the following components:   WBC 10.7 (*)    Hemoglobin 11.8 (*)    HCT 38.1 (*)    RDW 16.5 (*)    All other components within normal limits  DIFFERENTIAL - Abnormal; Notable for the following components:   Neutro Abs 8.6 (*)    Lymphs Abs 0.6 (*)    All other components within normal limits  CBC - Abnormal; Notable for the following components:   WBC 10.6 (*)    Hemoglobin 11.7 (*)    HCT 36.8 (*)    RDW 16.3 (*)    All other components within normal limits  LIPASE, BLOOD  URINALYSIS, ROUTINE W REFLEX MICROSCOPIC  AMMONIA  LACTIC ACID, PLASMA  PROTIME-INR  APTT  MAGNESIUM  CBG MONITORING, ED  I-STAT CHEM 8, ED    EKG EKG Interpretation  Date/Time:  Monday May 16 2023 10:42:58 EDT Ventricular Rate:  85 PR Interval:  144 QRS Duration: 80 QT Interval:  370 QTC Calculation: 440 R Axis:   96 Text Interpretation: Normal sinus rhythm Rightward axis Borderline ECG Confirmed by Gerhard Munch [phone]) on 05/16/2023 12:52:35 PM  Radiology CT HEAD WO CONTRAST  Result Date: 05/16/2023 CLINICAL DATA:  Mental status change of unknown cause. EXAM: CT HEAD WITHOUT CONTRAST TECHNIQUE: Contiguous axial images were obtained from the base of the skull through the vertex without intravenous contrast. RADIATION DOSE REDUCTION: This exam was  performed according to the departmental dose-optimization program which includes automated exposure control, adjustment of the mA and/or kV according to patient size and/or use of iterative reconstruction technique. COMPARISON:  12/31/2022 FINDINGS: Brain: There is central cortical atrophy. Periventricular white matter changes are consistent with small vessel disease. There is no intra or extra-axial fluid collection or mass lesion. The basilar cisterns and ventricles have a normal appearance. There is no CT evidence for acute infarction or hemorrhage. Vascular: There is atherosclerotic calcification of the internal carotid arteries. No hyperdense vessels. Skull: Normal. Negative for fracture or focal lesion. Sinuses/Orbits: There is increased significant opacity involving the LEFT frontal, LEFT ethmoid, LEFT sphenoid, and LEFT maxillary sinus. Air-fluid levels are present. Findings are consistent with increased, acute sinusitis, likely superimposed on chronic changes. Small LEFT mastoid effusion. Other: None. IMPRESSION: 1. Atrophy and small vessel disease. 2. No evidence for acute intracranial abnormality. 3. Increased acute sinusitis, likely superimposed on chronic changes. Electronically Signed   By: Norva Pavlov M.D.   On: 05/16/2023 13:23   DG Chest 1 View  Result Date: 05/16/2023 CLINICAL DATA:  Altered mental status.  Shortness of breath. EXAM: CHEST  1 VIEW COMPARISON:  01/15/2023 FINDINGS: Heart size normal. Stable elevation of the RIGHT hemidiaphragm. Lungs are clear. Slight rotation towards the RIGHT. IMPRESSION: No active disease. Electronically Signed   By: Norva Pavlov M.D.   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 Radiology: ordered.  Risk Prescription drug management.   Medical Decision Making / ED Course   This patient presents to the ED for concern of weakness, change in mental status, this involves an extensive number of treatment options, and is a complaint that carries with it a high risk of complications and morbidity.  The differential diagnosis includes pneumonia, UTI, acute intracranial etiology, metabolic derangement, acute on chronic functional decline  MDM: 77 year old male presents with above-mentioned complaints.  Son is at bedside and provides most of the history.  New complaints or weakness, mental status change.  Recently found to be hyponatremic at PCP office.  CBC with mild leukocytosis at 10.6 without significant left shift.  Hemoglobin of 11.7 which is around his baseline.  CMP shows sodium of 135, glucose 142 otherwise without acute findings.  CT head without acute intracranial finding with the exception of acute sinusitis.  Chest x-ray without acute cardiopulmonary process.  EKG without acute ischemic changes.  Given patient's functional decline, significant comorbidities and family's request of comfort being the primary goal palliative care consult was placed.  Will wait for remaining labs to see if there is any indication for admission or acute cause of patient's symptoms.  Magnesium 1.5.  Ammonia pending, lactic acid pending, UA pending.  If these are normal will need TOC consult for SNF placement.  Family unable to care for patient at home.  Patient at the end of my shift is awaiting lactic acid, ammonia level.  UA pending.  Signed out to oncoming provider to follow-up on these results.  If these are normal and no acute cause of patient's  symptoms found TOC will need to be consulted for SNF placement.  Family is amenable to this.  Additional history obtained: -Additional history obtained from son and outside [facility] records -External records from outside source obtained and reviewed including: Chart review including previous notes, labs, imaging, consultation notes   Lab Tests: -I ordered, reviewed, and interpreted labs.   The pertinent results include:   Labs Reviewed  COMPREHENSIVE METABOLIC PANEL - Abnormal; Notable for the following components:      Result Value   Chloride 97 (*)    Glucose, Bld 142 (*)    BUN 24 (*)    Albumin 3.0 (*)    All other components within normal limits  CBC - Abnormal; Notable for the following components:   WBC 10.7 (*)    Hemoglobin 11.8 (*)    HCT 38.1 (*)    RDW 16.5 (*)    All other components within normal limits  APTT - Abnormal; Notable for the following components:   aPTT 37 (*)    All other components within normal limits  DIFFERENTIAL - Abnormal; Notable for the following components:   Neutro Abs 8.6 (*)    Lymphs Abs 0.6 (*)    All other components within normal limits  CBC - Abnormal; Notable for the following components:   WBC 10.6 (*)    Hemoglobin 11.7 (*)    HCT 36.8 (*)    RDW 16.3 (*)    All other components within normal limits  I-STAT CHEM 8, ED - Abnormal; Notable for the following components:   Chloride 97 (*)    BUN 26 (*)    Glucose, Bld 138 (*)    Hemoglobin 12.6 (*)    HCT 37.0 (*)    All other components within normal limits  LIPASE, BLOOD  PROTIME-INR  URINALYSIS,  ROUTINE W REFLEX MICROSCOPIC  AMMONIA  LACTIC ACID, PLASMA  MAGNESIUM  CBG MONITORING, ED      EKG  EKG Interpretation  Date/Time:  Monday May 16 2023 10:42:58 EDT Ventricular Rate:  85 PR Interval:  144 QRS Duration: 80 QT Interval:  370 QTC Calculation: 440 R Axis:   96 Text Interpretation: Normal sinus rhythm Rightward axis Borderline ECG Confirmed by Gerhard Munch  [phone]) on 05/16/2023 12:52:35 PM         Imaging Studies ordered: I ordered imaging studies including CT head, chest x-ray I independently visualized and interpreted imaging. I agree with the radiologist interpretation   Medicines ordered and prescription drug management: No orders of the defined types were placed in this encounter.   -I have reviewed the patients home medicines and have made adjustments as needed   Reevaluation: After the interventions noted above, I reevaluated the patient and found that they have :stayed the same  Co morbidities that complicate the patient evaluation  Past Medical History:  Diagnosis Date   Aortic aneurysm (HCC) 05/2017   Chronic back pain    Diabetes mellitus without complication (HCC)    Dyslipidemia    GERD (gastroesophageal reflux disease)    H/O hiatal hernia    Hypertension    Melanoma (HCC)    "back; left arm"   PONV (postoperative nausea and vomiting)    Rheumatoid arthritis (HCC)    Spasm of esophagus       Dispostion: Patient signed out to oncoming provider to follow-up on results and disposition  Final Clinical Impression(s) / ED Diagnoses Final diagnoses:  None    Rx / DC Orders ED Discharge Orders     None         Marita Kansas, PA-C 05/16/23 1507    Gerhard Munch, MD 05/18/23 1015

## 2023-05-16 NOTE — Progress Notes (Addendum)
Consultation Note Date: 05/16/2023   Patient Name: [name]  DOB: 1946/04/02  MRN: [id number]  Age / Sex: 77 y.o., male  PCP: Garry Heater, MD Referring Physician: Gerhard Munch, MD  Reason for Consultation: Establishing goals of care, "family's request due to declining function"  HPI/Patient Profile: 77 y.o. 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  Son also reports significant cognitive and functional decline over the last week.  Patient is currently in the ED and not admitted.  Of note, patient has had 3 admissions and 1 ED visit to the [facility] medical system in the last 6 months.  He is also had several admissions at [facility] over this time period.  Clinical Assessment and Goals of Care: I have reviewed medical records including EPIC notes, labs, and imaging. Received report from primary RN -no acute concerns.  Went to visit patient at bedside -son/[name] present. Patient was lying in bed awake, alert, oriented to person only, and able to participate in conversation; though he is not able to make complex medical decisions. No signs or non-verbal gestures of pain or discomfort noted. No respiratory distress, increased work of breathing, or secretions noted.  Patient denies pain.  Met with patient and son to discuss diagnosis, prognosis, GOC, EOL wishes, disposition, and options.  I introduced Palliative Medicine as specialized medical care for people living with serious illness. It focuses on providing relief from the symptoms and stress of a serious illness. The goal is to improve  quality of life for both the patient and the family.  We discussed a brief life review of the patient as well as functional and nutritional status.  Patient is able to accurately recall some of the information provided; however, son does have to correct information regarding functional status. Patient is divorced -he has 2 sons.  Prior to hospitalization, patient was living in a private residence with his ex-wife.  Family started to notice a decline in 2020 indicating since this time it's "been rough."  Son notes a significant decline as of 1 week ago.  Prior to 1 week ago, patient was able to ambulate independently within his home as well as dress/bathe himself.  Patient is no longer able to ambulate, has been confused, and incontinent over the last week.  [name] tells me that "he has been through a lot and has been resilient."  Tacey Ruiz is able to describe patient's medical history in detail.  We discussed patient's current illness and what it means in the larger context of patient's on-going co-morbidities.  [name] has a clear understanding of patient's current acute medical situation in context of his comorbidities.  We discussed patient's multiple hospitalizations and clinical course over the last 6 months.  Natural disease trajectory and expectations at EOL were discussed. I attempted to elicit values and goals of care important to the patient. The difference between aggressive medical intervention and comfort care was considered in light of the patient's goals of care.  At this time, goal is to continue medical workup and treat anything found that could be reversible.   Patient tells me he does not have an [facility] POA or living will; though, son is able to email HCPOA documents.  Will upload these documents to ACP tab -document lists patient's ex-wife as HCPOA.  Son indicates he and his brother make most medical decisions for the patient has patient's ex-wife is frail herself; they do keep her updated.  Encouraged  patient/family to consider DNR/DNI status understanding evidenced based poor outcomes in similar hospitalized patient, as the cause of arrest is likely associated with advanced chronic/terminal illness rather than an easily reversible acute cardio-pulmonary event.  I shared that even if we pursued resuscitation we would not able to resolve the underlying factors. I explained that DNR/DNI does not change the medical plan and it only comes into effect after a person has arrested (died).  It is a protective measure to keep [place] from harming the patient in their last moments of life. Patient was not agreeable to DNR/DNI with understanding that he would receive CPR, defibrillation, ACLS medications, and/or intubation.  Patient tells me he will "fight until the end."  [name] does not wish to change code status at this time - he wishes to look for patient's living will.  He is hopeful if something is treatable patient may be able to continue code status discussions himself.  Questions and concerns were addressed.   Met with [name] outside patient's room.  Therapeutic listening provided as [name] expresses understanding and the reality of patient's current quality of life.  [name] states, "if this is it, this is not living."  Patient's PCP recommended palliative care for patient and son is not certain if he is most interested in palliative care versus hospice care.  Discussion on the difference between Palliative and Hospice care. Palliative care and hospice have similar goals of managing symptoms, promoting comfort, improving quality of life, and maintaining a person's dignity. However, palliative care may be offered during any phase of a serious illness, while hospice care is usually offered when a person is expected to live for 6 months or less.  Briefly discussed the philosophy of hospice care.  [name] understands if patient has another esophageal bleed, hospice would not provide blood transfusions, but rather manage the  situation/symptoms with comfort medications.  Reviewed that hospice cannot follow patient at rehab.  [name] understands the patient is at high risk for rehospitalization without hospice care.  We reviewed patient's cycle of admissions.  Goals at this time are to continue medical workup and treat the treatable.  Patient/family will make stepwise decisions pending clinical course and information.  After discussion, family are considering his discharge to rehab with outpatient palliative care to follow versus home with hospice.  Discussed with patient/family the importance of continued conversation with each other and the medical providers regarding overall plan of care and treatment options, ensuring decisions are within the context of the patient's values and GOCs.    All questions and concerns addressed. Encouraged to call with questions and/or concerns. PMT card provided.   Primary Decision Maker: HCPOA - exwife/[name] Wigal with assistance from their two sons    SUMMARY OF RECOMMENDATIONS   Continue medical work up - treat the treatable Continue full code If no reversible cause for patient's decline is found, family are open to continued hospice discussions Family are considering patient's discharge to rehab with outpatient palliative care to follow versus home with hospice Ongoing goals of care - family will make stepwise decisions pending information and patient's clinical course HCPOA documents printed and will be scanned into Vyn[employer]/ACP tab PMT will continue to follow and support holistically   Code Status/Advance Care Planning: Full code  Palliative Prophylaxis:  Aspiration, Delirium Protocol, Frequent Pain Assessment, Oral Care, and Turn Reposition  Additional Recommendations (Limitations, Scope, Preferences): Full Scope Treatment  Psycho-social/Spiritual:  Desire for further Chaplaincy support:no Created space and opportunity for patient and family to express thoughts and  feelings regarding patient's current medical situation.  Emotional support and therapeutic listening provided.  Prognosis:  Unable to determine  Discharge Planning: To Be Determined      Primary Diagnoses: Present on Admission: **None**   I have reviewed the medical record, interviewed the patient and family, and examined the patient. The following aspects are pertinent.  Past Medical History:  Diagnosis Date   Aortic aneurysm (HCC) 05/2017   Chronic back pain    Diabetes mellitus without complication (HCC)    Dyslipidemia    GERD (gastroesophageal reflux disease)    H/O hiatal hernia    Hypertension    Melanoma (HCC)    "back; left arm"   PONV (postoperative nausea and vomiting)    Rheumatoid arthritis (HCC)    Spasm of esophagus    Social History   Socioeconomic History   Marital status: Divorced    Spouse name: Not on file   Number of children: Not on file   Years of education: Not on file   Highest education level: Not on file  Occupational History   Not on file  Tobacco Use   Smoking status: Former    Packs/day: 1.00    Years: 46.00    Additional pack years: 0.00    Total pack years: 46.00    Types: Cigarettes    Quit date: 11/26/2005    Years since quitting: 17.4   Smokeless tobacco: Never  Substance and Sexual Activity   Alcohol use: Yes    Alcohol/week: 9.0 standard drinks of alcohol    Types: 9 Shots of liquor per week    Comment: 05/23/2014 "3, 1 shot drinks maybe 3 times/wk"   Drug use: No   Sexual activity: Not Currently  Other Topics Concern   Not on file  Social History Narrative   Not on file   Social Determinants of Health   Financial Resource Strain: Not on file  Food Insecurity: No Food Insecurity (12/28/2022)   Hunger Vital Sign    Worried About Running Out of Food in the Last Year: Never true    Ran Out of Food in the Last Year: Never true  Transportation Needs: No Transportation Needs (12/28/2022)   PRAPARE - Therapist, art (Medical): No    Lack of Transportation (Non-Medical): No  Physical Activity: Not on file  Stress: Not on file  Social Connections: Not on file   Family History  Problem Relation Age of Onset   Heart disease Mother    Heart disease Father    Cancer Maternal Grandmother    Scheduled Meds: Continuous Infusions:  sodium chloride     magnesium sulfate bolus IVPB     PRN Meds:. Medications Prior to Admission:  Prior to Admission medications   Medication Sig Start Date End Date Taking? Authorizing Provider  abatacept (ORENCIA) 250 MG injection Inject 250 mg into the vein every 30 (thirty) days. Patient taking differently: Inject 750 mg into the vein every 30 (thirty) days. 02/12/21   Evlyn Kanner, MD  alum & mag hydroxide-simeth (MAALOX/MYLANTA) 200-200-20 MG/5ML suspension Place 30 mLs into feeding tube 3 (three) times daily as needed for indigestion or heartburn. 01/28/23   Marrianne Mood, MD  aspirin 325 MG tablet  Place 1 tablet (325 mg total) into feeding tube daily. 01/28/23   Marrianne Mood, MD  atorvastatin (LIPITOR) 40 MG tablet Place 1 tablet (40 mg total) into feeding tube daily. 01/28/23   Marrianne Mood, MD  diclofenac Sodium (VOLTAREN) 1 % GEL Apply 2 g topically 4 (four) times daily. Patient not taking: Reported on 12/28/2022 08/27/22   Marolyn Haller, MD  Feeding Supplies KIT 1 Box by Does not apply route daily. Patient will need pump and supplies for continuous tube feeds and 60mL syringes for free water flushes 12/31/22   Mapp, Gaylyn Cheers, MD  ferrous sulfate 300 (60 Fe) MG/5ML syrup Place 5 mLs (300 mg total) into feeding tube daily. 01/28/23   Marrianne Mood, MD  fludrocortisone (FLORINEF) 0.1 MG tablet Place 1 tablet (0.1 mg total) into feeding tube daily. 01/29/23   Marrianne Mood, MD  folic acid (FOLVITE) 1 MG tablet Place 1 tablet (1 mg total) into feeding tube daily. 01/28/23   Marrianne Mood, MD  hydrocortisone (CORTEF) 10 MG tablet Place 1  tablet (10 mg total) into feeding tube daily at 2 PM. 01/28/23   Marrianne Mood, MD  hydrocortisone (CORTEF) 5 MG tablet Place 3 tablets (15 mg total) into feeding tube daily before breakfast. 01/28/23   Marrianne Mood, MD  levothyroxine (SYNTHROID) 50 MCG tablet Place 1 tablet (50 mcg total) into feeding tube daily. 01/28/23   Marrianne Mood, MD  lidocaine (XYLOCAINE) 2 % solution Use as directed 15 mLs in the mouth or throat 3 (three) times daily as needed for mouth pain (Indigestion). Patient not taking: Reported on 12/28/2022 08/27/22   Marolyn Haller, MD  magnesium oxide (MAG-OX) 400 (240 Mg) MG tablet Place 1 tablet (400 mg total) into feeding tube daily. 01/28/23   Marrianne Mood, MD  metFORMIN (GLUCOPHAGE) 500 MG tablet Place 1 tablet (500 mg total) into feeding tube daily. 01/28/23   Marrianne Mood, MD  Nutritional Supplements (FEEDING SUPPLEMENT, OSMOLITE 1.5 CAL,) LIQD Place 1,530 mLs into feeding tube daily. Run for 18 hours from 1600 to 1000 01/28/23   Marrianne Mood, MD  ondansetron (ZOFRAN) 4 MG tablet Place 1 tablet (4 mg total) into feeding tube every 12 (twelve) hours as needed for nausea or vomiting. 01/28/23   Marrianne Mood, MD  pantoprazole sodium (PROTONIX) 40 mg Take 40 mg per tube twice daily until 02/12/23. Take 40 mg per tube once daily thereafter. 01/28/23   Marrianne Mood, MD  Protein (FEEDING SUPPLEMENT, PROSOURCE TF20,) liquid Place 60 mLs into feeding tube daily. 01/07/23   Mapp, Gaylyn Cheers, MD  sucralfate (CARAFATE) 1 GM/10ML suspension Place 10 mLs (1 g total) into feeding tube every 6 (six) hours. 01/28/23   Marrianne Mood, MD  Water For Irrigation, Sterile (FREE WATER) SOLN Place 150 mLs into feeding tube 6 (six) times daily. 01/28/23   Marrianne Mood, MD   No Known Allergies Review of Systems  Unable to perform ROS: Mental status change    Physical Exam Vitals and nursing note reviewed.  Constitutional:      General: He is not in acute distress.     Appearance: He is ill-appearing.  Pulmonary:     Effort: No respiratory distress.  Skin:    General: Skin is warm and dry.  Neurological:     Mental Status: He is alert. He is disoriented and confused.     Motor: Weakness present.  Psychiatric:        Attention and Perception: Attention normal.        Behavior: Behavior  is cooperative.        Cognition and Memory: Cognition is impaired. Memory is impaired.        Judgment: Judgment is impulsive.     Vital Signs: BP 126/64   Pulse 83   Temp 97.7 F (36.5 C) (Oral)   Resp 16   SpO2 100%  Pain Scale: 0-10   Pain Score: 0-No pain   SpO2: SpO2: 100 % O2 Device:SpO2: 100 % O2 Flow Rate: .   IO: Intake/output summary: No intake or output data in the 24 hours ending 05/16/23 1551  LBM:   Baseline Weight:   Most recent weight:       Palliative Assessment/Data: PPS 30%     Signed by: Haskel Khan, NP   Please contact Palliative Medicine Team phone at [phone] for questions and concerns.  For individual provider: See Amion  *Portions of this note are a verbal dictation therefore any spelling and/or grammatical errors are due to the "[employer]" system interpretation.

## 2023-05-16 NOTE — H&P (Signed)
History and Physical    ABDULRHMAN AVINO ZHY:[id number] DOB: 1946/10/30 DOA: 05/16/2023  PCP: Garry Heater, MD   Patient coming from: Home   Chief Complaint: Generally weak, confused, incontinent   HPI: KIANO SPIERING is a 77 y.o. 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  Patient was hospitalized at [facility] earlier this month with recurrent GI bleed.  That hospitalization was complicated by delirium, which he also experienced during her hospitalization this year.  Per report of his son, he had returned to his baseline after the recent episode of delirium, was ambulatory and performing ADLs 1 week ago, but since then has had significant decline to the point where he is unable to ambulate and requiring intensive assistance.  He has been disoriented and newly incontinent.  ED Course: Upon arrival to the ED, patient is found to be afebrile and saturating well on room air with normal heart rate and stable blood pressure.  EKG demonstrates sinus rhythm with RAD.  Head CT and chest x-ray are negative for acute findings.  Labs are most notable for magnesium 1.5, BUN 24, and lactic acid 2.7.  Patient was given 500 mL of normal saline and 2 g IV magnesium in the ED.  Review of Systems:  ROS limited by patient's clinical condition.  Past Medical History:  Diagnosis Date   Aortic aneurysm (HCC) 05/2017   Chronic back pain    Diabetes mellitus without complication (HCC)    Dyslipidemia    GERD (gastroesophageal reflux disease)    H/O  hiatal hernia    Hypertension    Melanoma (HCC)    "back; left arm"   PONV (postoperative nausea and vomiting)    Rheumatoid arthritis (HCC)    Spasm of esophagus     Past Surgical History:  Procedure Laterality Date   aneursym repair     aneursym repair and stent placement  05/2017   BIOPSY  08/19/2022   Procedure: BIOPSY;  Surgeon: Meryl Dare, MD;  Location: [facility] ENDOSCOPY;  Service: Gastroenterology;;   CARPAL TUNNEL RELEASE Bilateral    COLON SURGERY  2007   removed 18" of colon   ERCP W/ METAL STENT PLACEMENT  11/2005   Hattie Perch 12/14/2005   ESOPHAGOGASTRODUODENOSCOPY (EGD) WITH ESOPHAGEAL DILATION     "several times'   ESOPHAGOGASTRODUODENOSCOPY (EGD) WITH PROPOFOL N/A 08/19/2022   Procedure: ESOPHAGOGASTRODUODENOSCOPY (EGD) WITH PROPOFOL;  Surgeon: Meryl Dare, MD;  Location: [facility] END[facility]COPY;  Service: Gastroenterology;  Laterality: N/A;   ESOPHAGOGASTRODUODENOSCOPY (EGD) WITH PROPOFOL N/A 01/15/2023   Procedure: ESOPHAGOGASTRODUODENOSCOPY (EGD) WITH PROPOFOL;  Surgeon: Benancio Deeds, MD;  Location: [facility] ENDOSCOPY;  Service: Gastroenterology;  Laterality: N/A;   ESOPHAGOGASTRODUODENOSCOPY (EGD) WITH PROPOFOL N/A 01/20/2023   Procedure: ESOPHAGOGASTRODUODENOSCOPY (EGD) WITH PROPOFOL;  Surgeon: Napoleon Form, MD;  Location: [facility] ENDOSCOPY;  Service: Gastroenterology;  Laterality: N/A;   HAND SURGERY Bilateral    "plastic knuckles"   IR ANGIO INTRA EXTRACRAN SEL COM CAROTID INNOMINATE UNI R MOD SED  01/03/2023   IR ANGIO VERTEBRAL SEL SUBCLAVIAN INNOMINATE UNI L MOD SED  01/03/2023   IR ANGIO VERTEBRAL SEL VERTEBRAL UNI R MOD SED  01/03/2023  IR INTRAVSC STENT CERV CAROTID W/EMB-PROT MOD SED INCL ANGIO  01/03/2023   IR REPLC GASTRO/COLONIC TUBE PERCUT W/FLUORO  01/21/2023   IR US GUIDE VASC ACCESS RIGHT  01/03/2023   LAPAROSCOPIC CHOLECYSTECTOMY  01/2006   MELANOMA EXCISION     "back; left arm"   NASAL SEPTUM SURGERY     PELVIC FRACTURE SURGERY  1964   "busted in 2  places"   RADIOLOGY WITH ANESTHESIA N/A 01/03/2023   Procedure: IR WITH ANESTHESIA;  Surgeon: Radiologist, Medication, MD;  Location: [facility] OR;  Service: Radiology;  Laterality: N/A;   RESECTION DISTAL CLAVICAL Right 05/13/2016   Procedure: DISTAL CLAVICLE EXCISION;  Surgeon: Loreta Ave, MD;  Location: [facility];  Service: Orthopedics;  Laterality: Right;   SHOULDER ARTHROSCOPY WITH ROTATOR CUFF REPAIR AND SUBACROMIAL DECOMPRESSION Right 05/13/2016   Procedure: RIGHT SHOULDER SCOPE DEBRIDEMENT, ACROMIOPLASTY, ROTATOR CUFF REPAIR; RELEASE BICEPS TENDON AND DEBRIDEMENT LABRUM;  Surgeon: Loreta Ave, MD;  Location: [facility];  Service: Orthopedics;  Laterality: Right;   TIBIA FRACTURE SURGERY Left 2006   "put a steel rod in it"    Social History:   reports that he quit smoking about 17 years ago. His smoking use included cigarettes. He has a 46.00 pack-year smoking history. He has never used smokeless tobacco. He reports current alcohol use of about 9.0 standard drinks of alcohol per week. He reports that he does not use drugs.  No Known Allergies  Family History  Problem Relation Age of Onset   Heart disease Mother    Heart disease Father    Cancer Maternal Grandmother      Prior to Admission medications   Medication Sig Start Date End Date Taking? Authorizing Provider  abatacept (ORENCIA) 250 MG injection Inject 250 mg into the vein every 30 (thirty) days. Patient taking differently: Inject 750 mg into the vein every 30 (thirty) days. 02/12/21  Yes Evlyn Kanner, MD  Acetaminophen (TYLENOL ARTHRITIS PAIN PO) Take 325 mg by mouth as needed (As needed).   Yes [provider]  atorvastatin (LIPITOR) 40 MG tablet Place 1 tablet (40 mg total) into feeding tube daily. 01/28/23  Yes Marrianne Mood, MD  diclofenac Sodium (VOLTAREN) 1 % GEL Apply 2 g topically 4 (four) times daily.   Yes [provider]  ferrous sulfate 300 (60 Fe) MG/5ML  syrup Place 5 mLs (300 mg total) into feeding tube daily. 01/28/23  Yes Marrianne Mood, MD  fludrocortisone (FLORINEF) 0.1 MG tablet Place 1 tablet (0.1 mg total) into feeding tube daily. 01/29/23  Yes Marrianne Mood, MD  fluticasone (FLONASE) 50 MCG/ACT nasal spray Place 1 spray into both nostrils daily.   Yes [provider]  folic acid (FOLVITE) 1 MG tablet Place 1 tablet (1 mg total) into feeding tube daily. 01/28/23  Yes Marrianne Mood, MD  hydrocortisone (CORTEF) 10 MG tablet Place 1 tablet (10 mg total) into feeding tube daily at 2 PM. 01/28/23  Yes Marrianne Mood, MD  hydrocortisone (CORTEF) 5 MG tablet Place 3 tablets (15 mg total) into feeding tube daily before breakfast. 01/28/23  Yes Marrianne Mood, MD  levothyroxine (SYNTHROID) 50 MCG tablet Place 1 tablet (50 mcg total) into feeding tube daily. 01/28/23  Yes Marrianne Mood, MD  loratadine (CLARITIN) 10 MG tablet Take 10 mg by mouth daily.   Yes [provider]  magnesium oxide (MAG-OX) 400 (240 Mg) MG tablet Place 1 tablet (400 mg total) into feeding tube daily. 01/28/23  Yes Marrianne Mood,  MD  metFORMIN (GLUCOPHAGE) 500 MG tablet Place 1 tablet (500 mg total) into feeding tube daily. 01/28/23  Yes Marrianne Mood, MD  methotrexate 50 MG/2ML injection Inject 15 mg into the skin once a week. 04/25/23 04/17/30 Yes [provider]  Nutritional Supplements (FEEDING SUPPLEMENT, OSMOLITE 1.5 CAL,) LIQD Place 1,530 mLs into feeding tube daily. Run for 18 hours from 1600 to 1000 01/28/23  Yes Marrianne Mood, MD  oxyCODONE (OXY IR/ROXICODONE) 5 MG immediate release tablet Take 5 mg by mouth as needed for severe pain.   Yes [provider]  pantoprazole sodium (PROTONIX) 40 mg Take 40 mg per tube twice daily until 02/12/23. Take 40 mg per tube once daily thereafter. 01/28/23  Yes Marrianne Mood, MD  sucralfate (CARAFATE) 1 GM/10ML suspension Place 10 mLs (1 g total) into feeding tube every 6 (six) hours.  01/28/23  Yes Marrianne Mood, MD  Feeding Supplies KIT 1 Box by Does not apply route daily. Patient will need pump and supplies for continuous tube feeds and 60mL syringes for free water flushes 12/31/22   Mapp, Gaylyn Cheers, MD  Water For Irrigation, Sterile (FREE WATER) SOLN Place 150 mLs into feeding tube 6 (six) times daily. 01/28/23   Marrianne Mood, MD    Physical Exam: Vitals:   05/16/23 1800 05/16/23 1815 05/16/23 1830 05/16/23 2002  BP:      Pulse:      Resp: 20 20 17    Temp:    98.9 F (37.2 C)  TempSrc:    Oral  SpO2:         Constitutional: NAD, cachectic   Eyes: PERTLA, lids and conjunctivae normal ENMT: Mucous membranes are moist. Posterior pharynx clear of any exudate or lesions.   Neck: supple, no masses  Respiratory:  no wheezing, no crackles. No accessory muscle use.  Cardiovascular: S1 & S2 heard, regular rate and rhythm. No JVD. Abdomen: No distension, no tenderness, soft. Bowel sounds active.  Musculoskeletal: no clubbing / cyanosis. No joint deformity upper and lower extremities. Distal RUE contracted.  Skin: no significant rashes, lesions, ulcers. Warm, dry, well-perfused. Neurologic: CN 2-12 grossly intact. Moving all extremities. Sleeping, wakes to voice and makes eye-contact but is disoriented.  Psychiatric: Calm. Cooperative.    Labs and Imaging on Admission: I have personally reviewed following labs and imaging studies  CBC: Recent Labs  Lab 05/16/23 1052 05/16/23 1345 05/16/23 1423  WBC 10.7* 10.6*  --   NEUTROABS  --  8.6*  --   HGB 11.8* 11.7* 12.6*  HCT 38.1* 36.8* 37.0*  MCV 86.2 84.2  --   PLT 212 204  --    Basic Metabolic Panel: Recent Labs  Lab 05/16/23 1052 05/16/23 1345 05/16/23 1423  NA 135  --  139  K 3.8  --  4.1  CL 97*  --  97*  CO2 25  --   --   GLUCOSE 142*  --  138*  BUN 24*  --  26*  CREATININE 1.03  --  1.10  CALCIUM 9.3  --   --   MG  --  1.5*  --    GFR: CrCl cannot be calculated (Unknown ideal  weight.). Liver Function Tests: Recent Labs  Lab 05/16/23 1052  AST 20  ALT 16  ALKPHOS 104  BILITOT 0.5  PROT 7.5  ALBUMIN 3.0*   Recent Labs  Lab 05/16/23 1052  LIPASE 31   Recent Labs  Lab 05/16/23 1345  AMMONIA <10   Coagulation Profile: Recent Labs  Lab 05/16/23 1345  INR 1.2   Cardiac Enzymes: No results for input(s): "CKTOTAL", "CKMB", "CKMBINDEX", "TROPONINI" in the last 168 hours. BNP (last 3 results) No results for input(s): "PROBNP" in the last 8760 hours. HbA1C: No results for input(s): "HGBA1C" in the last 72 hours. CBG: No results for input(s): "GLUCAP" in the last 168 hours. Lipid Profile: No results for input(s): "CHOL", "HDL", "LDLCALC", "TRIG", "CHOLHDL", "LDLDIRECT" in the last 72 hours. Thyroid Function Tests: No results for input(s): "TSH", "T4TOTAL", "FREET4", "T3FREE", "THYROIDAB" in the last 72 hours. Anemia Panel: No results for input(s): "VITAMINB12", "FOLATE", "FERRITIN", "TIBC", "IRON", "RETICCTPCT" in the last 72 hours. Urine analysis:    Component Value Date/Time   COLORURINE YELLOW 05/16/2023 1755   APPEARANCEUR CLEAR 05/16/2023 1755   LABSPEC 1.017 05/16/2023 1755   PHURINE 7.0 05/16/2023 1755   GLUCOSEU NEGATIVE 05/16/2023 1755   HGBUR NEGATIVE 05/16/2023 1755   BILIRUBINUR NEGATIVE 05/16/2023 1755   KETONESUR NEGATIVE 05/16/2023 1755   PROTEINUR NEGATIVE 05/16/2023 1755   UROBILINOGEN 0.2 05/28/2015 1540   NITRITE NEGATIVE 05/16/2023 1755   LEUKOCYTESUR NEGATIVE 05/16/2023 1755   Sepsis Labs: @LABRCNTIP (procalcitonin:4,lacticidven:4) )No results found for this or any previous visit (from the past 240 hour(s)).   Radiological Exams on Admission: CT HEAD WO CONTRAST  Result Date: 05/16/2023 CLINICAL DATA:  Mental status change of unknown cause. EXAM: CT HEAD WITHOUT CONTRAST TECHNIQUE: Contiguous axial images were obtained from the base of the skull through the vertex without intravenous contrast. RADIATION DOSE  REDUCTION: This exam was performed according to the departmental dose-optimization program which includes automated exposure control, adjustment of the mA and/or kV according to patient size and/or use of iterative reconstruction technique. COMPARISON:  12/31/2022 FINDINGS: Brain: There is central cortical atrophy. Periventricular white matter changes are consistent with small vessel disease. There is no intra or extra-axial fluid collection or mass lesion. The basilar cisterns and ventricles have a normal appearance. There is no CT evidence for acute infarction or hemorrhage. Vascular: There is atherosclerotic calcification of the internal carotid arteries. No hyperdense vessels. Skull: Normal. Negative for fracture or focal lesion. Sinuses/Orbits: There is increased significant opacity involving the LEFT frontal, LEFT ethmoid, LEFT sphenoid, and LEFT maxillary sinus. Air-fluid levels are present. Findings are consistent with increased, acute sinusitis, likely superimposed on chronic changes. Small LEFT mastoid effusion. Other: None. IMPRESSION: 1. Atrophy and small vessel disease. 2. No evidence for acute intracranial abnormality. 3. Increased acute sinusitis, likely superimposed on chronic changes. Electronically Signed   By: Norva Pavlov M.D.   On: 05/16/2023 13:23   DG Chest 1 View  Result Date: 05/16/2023 CLINICAL DATA:  Altered mental status.  Shortness of breath. EXAM: CHEST  1 VIEW COMPARISON:  01/15/2023 FINDINGS: Heart size normal. Stable elevation of the RIGHT hemidiaphragm. Lungs are clear. Slight rotation towards the RIGHT. IMPRESSION: No active disease. Electronically Signed   By: Norva Pavlov M.D.   On: 05/16/2023 13:14    EKG: Independently reviewed. Sinus rhythm, RAD.   Assessment/Plan   1. Failure to thrive; delirium  - Pt with chronic debility d/t multiple medical problems and recurrent episodes of delirium whenever hospitalized p/w worsening generalized weakness, requiring  more intensive assistance with ambulation and ADLs at home, and waxing/waning confusion   - There is no acute findings on head CT, no evidence for infection, and normal ammonia in ED; magnesium was found to be low and replaced  - Continue workup for reversible cause with phosphorus level, B12, folate, TSH, and CK, continue  supportive care, delirium precautions, PT eval     2. Adrenal insufficiency  - Continue Florinef and Cortef    3. RA; ILD  - Managed with Orencia and methotrexate    4. Esophagitis; esophageal stricture  - GJ tube dependent, followed by GI at [facility]  - Continue NPO, PPI, Carafate    5. Type II DM  - Check CBGs and use low-intensity SSI for now    6. Hx DVT/PE  - Not anticoagulated after recurrent GI bleeds   7. Hx of CVA  - No longer on antiplatelets after multiple GI bleeds  - Continue Lipitor      DVT prophylaxis: Lovenox  Code Status: Full  Level of Care: Level of care: Med-Surg Family Communication: Son  Disposition Plan:  Patient is from: home  Anticipated d/c is to: SNF Anticipated d/c date is: 05/17/23  Patient currently: Pending further laboratory workup of encephalopathy, PT eval  Consults called: None  Admission status: Observation     Briscoe Deutscher, MD [facility]  05/16/2023, 8:12 PM

## 2023-05-16 NOTE — ED Notes (Signed)
Pt alert, NAD, calm, interactive, cooperative, participatory, self transferred from w/c to stretcher with assistance.

## 2023-05-16 NOTE — Progress Notes (Signed)
TOC CSW was able to verify with Medicare that pt does qualify for a new benefit roll over.  Pt has also meet the 3 night stay at [facility] (5/8-5/10/2023).  Kohler Pellerito Tarpley-Carter, MSW, LCSW-A Pronouns:  She/Her/Hers [facility] of Care Clinical Social Worker Direct Number:  [phone] [email or web address]

## 2023-05-16 NOTE — ED Notes (Signed)
Palliative care at [facility]. Son at [facility].

## 2023-05-16 NOTE — Progress Notes (Addendum)
On 01/28/2023, the pt had 25 days left of the 100 days that are allowed for SNF placement by Medicare.  Therefore, the pt has no more days for SNF placement.  Then on 02/22/2023, the pt would go into 60 days of Wellness Visits. The pt was an inpt at [facility] from 5/8-5/10/2023, which started the 60 Wellness Visits over.  And the admission to the ED will start the 60 Wellness Visits over.  Pt will have to pay out of pocket until the 60 of Wellness grace period is met.  Raizy Auzenne Tarpley-Carter, MSW, LCSW-A Pronouns:  She/Her/Hers [facility] Clinical Social Worker Direct Number:  [phone] [email or web address]

## 2023-05-17 DIAGNOSIS — K222 Esophageal obstruction: Secondary | ICD-10-CM | POA: Diagnosis not present

## 2023-05-17 DIAGNOSIS — Z7189 Other specified counseling: Secondary | ICD-10-CM | POA: Diagnosis not present

## 2023-05-17 DIAGNOSIS — C01 Malignant neoplasm of base of tongue: Secondary | ICD-10-CM

## 2023-05-17 DIAGNOSIS — J849 Interstitial pulmonary disease, unspecified: Secondary | ICD-10-CM

## 2023-05-17 DIAGNOSIS — R627 Adult failure to thrive: Secondary | ICD-10-CM | POA: Diagnosis not present

## 2023-05-17 DIAGNOSIS — Z86711 Personal history of pulmonary embolism: Secondary | ICD-10-CM

## 2023-05-17 DIAGNOSIS — G9340 Encephalopathy, unspecified: Secondary | ICD-10-CM | POA: Diagnosis not present

## 2023-05-17 LAB — BASIC METABOLIC PANEL
Anion gap: 10 (ref 5–15)
BUN: 19 mg/dL (ref 8–23)
CO2: 26 mmol/L (ref 22–32)
Calcium: 8.8 mg/dL — ABNORMAL LOW (ref 8.9–10.3)
Chloride: 97 mmol/L — ABNORMAL LOW (ref 98–111)
Creatinine, Ser: 0.91 mg/dL (ref 0.61–1.24)
GFR, Estimated: >60 mL/min (ref >60)
Glucose, Bld: 150 mg/dL — ABNORMAL HIGH (ref 70–99)
Potassium: 3.9 mmol/L (ref 3.5–5.1)
Sodium: 133 mmol/L — ABNORMAL LOW (ref 135–145)

## 2023-05-17 LAB — GLUCOSE, CAPILLARY
Glucose-Capillary: 130 mg/dL — ABNORMAL HIGH (ref 70–99)
Glucose-Capillary: 140 mg/dL — ABNORMAL HIGH (ref 70–99)
Glucose-Capillary: 155 mg/dL — ABNORMAL HIGH (ref 70–99)
Glucose-Capillary: 160 mg/dL — ABNORMAL HIGH (ref 70–99)
Glucose-Capillary: 167 mg/dL — ABNORMAL HIGH (ref 70–99)

## 2023-05-17 LAB — CBC
HCT: 33.3 % — ABNORMAL LOW (ref 39.0–52.0)
Hemoglobin: 10.6 g/dL — ABNORMAL LOW (ref 13.0–17.0)
MCH: 26.1 pg (ref 26.0–34.0)
MCHC: 31.8 g/dL (ref 30.0–36.0)
MCV: 82 fL (ref 80.0–100.0)
Platelets: 200 10*3/uL (ref 150–400)
RBC: 4.06 MIL/uL — ABNORMAL LOW (ref 4.22–5.81)
RDW: 16.4 % — ABNORMAL HIGH (ref 11.5–15.5)
WBC: 6.4 10*3/uL (ref 4.0–10.5)
nRBC: 0 % (ref 0.0–0.2)

## 2023-05-17 LAB — MAGNESIUM: Magnesium: 1.7 mg/dL (ref 1.7–2.4)

## 2023-05-17 MED ORDER — SODIUM CHLORIDE 0.9 % IV SOLN: INTRAVENOUS | Status: DC

## 2023-05-17 MED ORDER — OSMOLITE 1.5 CAL PO LIQD
1530.0000 mL | ORAL | Status: DC
  Administered 2023-05-18 – 2023-05-22 (×6): 1530 mL
  Filled 2023-05-17 (×6): qty 2000

## 2023-05-17 MED ORDER — PROSOURCE TF20 ENFIT COMPATIBL EN LIQD
60.0000 mL | Freq: Every day | ENTERAL | Status: DC
  Administered 2023-05-17 – 2023-05-23 (×7): 60 mL
  Filled 2023-05-17 (×7): qty 60

## 2023-05-17 NOTE — Progress Notes (Signed)
2 pages sent out to Registered Dietitian on call. One with call back number to nurses station provided and the other with call back number to RN work phone provided. First page around [phone]. Second page sent at 1845. No response yet. RN reaching out because per dietary note, patient is on 60 mL prosource QD and osmolite 1.5 for 18 hours a day however, the order states starting tomorrow. Patient was running tube feed yesterday from 05/20 at 2300 until 5/21 at 1129. RN verifying if order was meant to start tonight or if we were skipping a day and starting tomorrow.

## 2023-05-17 NOTE — Progress Notes (Signed)
Initial Nutrition Assessment  DOCUMENTATION CODES:   Severe malnutrition in context of chronic illness  INTERVENTION:  Tube Feeds via G-tube: Osmolite 1.5 at 85 mL/hr x 18 hours (1530 mL per day) 60 mL ProSource TF20 - Daily Free water flush: 150 mL - 6  times per day Tube feeds at goal provides 2375 kcal, 116 gm protein, and 2066 mL free water daily  NUTRITION DIAGNOSIS:   Severe Malnutrition related to chronic illness as evidenced by severe muscle depletion, severe fat depletion.  GOAL:   Patient will meet greater than or equal to 90% of their needs  MONITOR:   Labs, I & O's, TF tolerance, Weight trends  REASON FOR ASSESSMENT:   Consult Enteral/tube feeding initiation and management  ASSESSMENT:   77 y.o. male presented to the ED with generalized weakness, confusion and incontinence. PMH includes tongue cancer s/p radiation and chemotheray, CVA, T2DM, esophagitis, GERD, adrenal insufficiency, HTN, recurrent GI bleeds, and GJ tube dependent. Pt admitted with FTT and delirium.   Pt laying in bed, no family at bedside. Reports that his ex-wife helps him with his tube feeds at home. Denies any intolerance to them. Not completely sure of his regimen at home either. Pt unsure about any recent weight changes. Per EMR, pt with a 2.4% weight loss within 5 months, this is not significant for time frame.  Pt meets criteria for malnutrition based on physical exam with severe fat and muscle depletions noted.   RD observed pt tube, pt with a G-tube in place.   Medications reviewed and include: NovoLog SSI, Protonix, Sucralfate Labs reviewed: Sodium 133, Potassium 3.9, Magnesium 1.7, Folate 26.7, Vitamin B12 864, Hgb A1c 6.2 (12/27/22) CBGs: 119-167 x 24 hrs  NUTRITION - FOCUSED PHYSICAL EXAM:  Flowsheet Row Most Recent Value  Orbital Region Moderate depletion  Upper Arm Region Severe depletion  Thoracic and Lumbar Region Severe depletion  Buccal Region Moderate depletion  Temple  Region Mild depletion  Clavicle Bone Region Severe depletion  Clavicle and Acromion Bone Region Severe depletion  Scapular Bone Region Severe depletion  Dorsal Hand Severe depletion  Patellar Region Severe depletion  Anterior Thigh Region Severe depletion  Posterior Calf Region Severe depletion  Edema (RD Assessment) None  Hair Reviewed  Eyes Reviewed  Mouth Reviewed  Skin Reviewed  Nails Reviewed   Diet Order:   Diet Order             Diet NPO time specified  Diet effective now                   EDUCATION NEEDS:   No education needs have been identified at this time  Skin:  Skin Assessment: Reviewed RN Assessment  Last BM:  Unknown/PTA  Height:  Ht Readings from Last 1 Encounters:  01/06/23 6\' 2"  (1.88 m)   Weight:  Wt Readings from Last 1 Encounters:  05/17/23 73.9 kg   Ideal Body Weight:  86.4 kg  BMI:  Body mass index is 20.92 kg/m.  Estimated Nutritional Needs:  Kcal:  2100-2300 Protein:  105-125 grams Fluid:  >/= 2 L   Kirby Crigler RD, LDN Clinical Dietitian See [facility] for contact information.

## 2023-05-17 NOTE — TOC Initial Note (Signed)
Transition of Care [facility]) - Initial/Assessment Note    Patient Details  Name: [name] MRN: [id number] Date of Birth: [birth year]  Transition of Care [facility]) CM/SW Contact:    Connell Bognar A [place], Theresia Majors Phone Number: 05/17/2023, 11:59 AM  Clinical Narrative:                 CSW met with pt at bedside to discuss recommendation for SNF at discharge. Pt was acceptable to referral. Pt was somewhat confused and CSW received verbal permission for collateral contact with pt's son, [name].   CSW contacted pt's son for collateral information regarding pt.   [name] stated that pt would benefit from SNF and has been in the past. He said that when pt is able to ambulate and use the shower on his own he is able to live independently with spouse. His recent decline has made him living at home challenging.   He said pt has attended [facility] and [facility] in the past and that is preference for SNF.   SNF work up needed.  TOC will continue to follow.   Expected Discharge Plan: Skilled Nursing Facility Barriers to Discharge: Continued Medical Work up, SNF Pending bed offer   Patient Goals and CMS Choice            Expected Discharge Plan and Services In-house Referral: Clinical Social Work   Post Acute Care Choice: Skilled Nursing Facility Living arrangements for the past 2 months: Single Family Home                                      Prior Living Arrangements/Services Living arrangements for the past 2 months: Single Family Home Lives with:: Spouse                   Activities of Daily Living      Permission Sought/Granted   Permission granted to share information with : Yes, Verbal Permission Granted  Share Information with NAME: Traegan Chargualaf     Permission granted to share info w Relationship: pt's son  Permission granted to share info w Contact Information: [phone]  Emotional Assessment Appearance:: Appears older than stated  age Attitude/Demeanor/Rapport: Lethargic Affect (typically observed): Calm Orientation: : Oriented to Self, Oriented to Place Alcohol / Substance Use: Not Applicable Psych Involvement: No (comment)  Admission diagnosis:  Failure to thrive in adult [R62.7] Patient Active Problem List   Diagnosis Date Noted   Failure to thrive in adult 05/16/2023   History of pulmonary embolism 05/16/2023   Presence of internal carotid stent 01/28/2023   Primary adrenal insufficiency (HCC) 01/28/2023   Protein-calorie malnutrition, severe 01/21/2023   Excessive oral secretions 01/19/2023   PEG (percutaneous endoscopic gastrostomy) status (HCC) 01/17/2023   Long term current use of anticoagulant therapy 01/17/2023   Debility 01/17/2023   Advanced care planning/counseling discussion 01/17/2023   Antiplatelet or antithrombotic long-term use 01/15/2023   Anemia, posthemorrhagic, acute 01/15/2023   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/06/2023   Severe malnutrition (HCC) 12/30/2022   Sepsis without acute organ dysfunction (HCC) 12/28/2022   Influenza A 12/28/2022   History of CVA (cerebrovascular accident) 12/28/2022   Acute encephalopathy 12/27/2022   Acute esophagitis    Abdominal pain, epigastric    Closed left fibular fracture 08/22/2022   Reflux esophagitis 08/22/2022   Pressure injury of skin 08/20/2022   Upper GI bleed 08/19/2022  UGIB (upper gastrointestinal bleed) 08/18/2022   Pancytopenia (HCC)    Immunosuppression (HCC)    ILD (interstitial lung disease) (HCC)    Diarrhea    DIC (disseminated intravascular coagulation) (HCC)    AKI (acute kidney injury) (HCC)    COVID-19 01/26/2021   Rheumatoid arthritis (HCC) 07/22/2015   Mucositis due to chemotherapy 06/25/2015   Mucositis due to radiation therapy 06/25/2015   SBO (small bowel obstruction) (HCC) 05/29/2015   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   H/O drug therapy 05/12/2015   Abnormal mental state  04/22/2015   Fast heart beat 04/22/2015   Febrile 04/18/2015   Candida esophagitis (HCC) 04/16/2015   Dysphagia, pharyngoesophageal phase 04/16/2015   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   Fall 05/24/2014   Traumatic pneumothorax 05/23/2014   Diabetes mellitus, type 2 (HCC) 05/01/2014   Perirectal abscess 07/31/2013   Hemorrhoids 07/31/2013   Lentigines 11/13/2012   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   Atypical nevus 08/29/2012   Malignant melanoma in situ (HCC) 08/29/2012   Polypharmacy 08/23/2012   Dyspnea 08/14/2012   Rheumatoid arthritis(714.0) 08/14/2012   HTN (hypertension) 08/14/2012   GERD (gastroesophageal reflux disease) 08/14/2012   Emphysema 08/14/2012   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   Duodenal ulcer with hemorrhage 10/15/2011   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   Benign esophageal stricture 10/15/2011   PCP:  Garry Heater, MD Pharmacy:   [facility] [address], [place] - [address] [place] [address] Phone: [phone] Fax: [phone]  [id number] - [place], [place] - [address] AT [facility] [address] & [facility] [address] [place] [address] Phone: [phone] Fax: [phone]  [id number] - [place], [place] - [address] AT [facility] [address] [place] [address] Phone: [phone] Fax: [phone]  [facility] [id number] - [place], [place] - [address] [place] [address] Phone: [phone] Fax: [phone]  [facility] [address] [place] [address] Phone: [phone] Fax: [phone]  [facility] [id number] - [place], [place] - [address] [place] [address] Phone: [phone] Fax: [phone]     Social Determinants of Health (SDOH) Social  History: SDOH Screenings   Food Insecurity: No Food Insecurity (12/28/2022)  Housing: Low Risk  (12/28/2022)  Transportation Needs: No Transportation Needs (12/28/2022)  Utilities: Not At Risk (12/28/2022)  Tobacco Use: Medium Risk (05/16/2023)   SDOH Interventions:     Readmission Risk Interventions    01/21/2023    9:13 AM  Readmission Risk Prevention Plan  Transportation Screening Complete  Medication Review (RN Care Manager) Complete  PCP or Specialist appointment within 3-5 days of discharge Complete  HRI or Home Care Consult Complete  SW Recovery Care/Counseling Consult Complete  Palliative Care Screening Not Applicable  Skilled Nursing Facility Complete

## 2023-05-17 NOTE — Progress Notes (Signed)
PROGRESS NOTE    [name]  MVH:[id number] DOB: [birth year] DOA: 05/16/2023 PCP: Garry Heater, MD   Brief Narrative:  77 y.o. 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  On presentation, CT of the head and chest x-ray were negative for acute findings.  Lactic acid was 2.7.  He was given IV fluids in the ED.  Assessment & Plan:   Failure to thrive Delirium -Patient with chronic debility due to multiple medical problems and recurrent episodes of delirium whenever hospitalized presented with worsening generalized weakness, requiring more intensive assistance with ambulation and ADLs at home with waxing/waning confusion -CT of the head was negative for acute abnormality.  No evidence of infection.  Ammonia normal. -B12 and folate normal.  TSH elevated but would not explain symptoms. -Monitor mental status.  Fall precautions. -Delirium precautions. -PT eval -Palliative care following: Patient remains full code.  Adrenal insufficiency -Continue Florinef and Cortef  Anemia of chronic disease -From chronic illnesses.  Hemoglobin stable.  Monitor intermittently  Hyponatremia -Mild.  Start gentle hydration.  Repeat a.m. labs.  History of esophagitis/esophageal stricture -GJ tube dependent: Followed by GI at [facility] -Continue NPO.  Consult nutrition to resume tube feeding.  Continue PPI and Carafate  Hypothyroidism -Continue  Synthroid  Diabetes mellitus type 2 -Continue CBGs with SSI  Hyperlipidemia -Continue statin  History of RA;ILD -Managed with Orencia and methotrexate as an outpatient.  Outpatient follow-up with rheumatology  History of DVT/PE -Not anticoagulated because of recurrent GI bleeds  History of unspecified CVA -Not on antiplatelets because of multiple GI bleeds -Continue Lipitor   DVT prophylaxis: Lovenox Code Status: Full Family Communication: None at bedside Disposition Plan: Status is: Observation The patient will require care spanning > 2 midnights and should be moved to inpatient because: PT eval.  Need for SNF placement  Consultants: Palliative care  Procedures: None  Antimicrobials: None   Subjective: Patient seen and examined at bedside.  Slow to respond but answers questions appropriately.  Denies any current chest pain, fever, vomiting.  Feels weak.  Objective: Vitals:   05/17/23 0031 05/17/23 0459 05/17/23 0500 05/17/23 1001  BP: 138/63 139/76  (!) 147/83  Pulse: 79 68  71  Resp: 16 16  18   Temp: 98.5 F (36.9 C) 97.7 F (36.5 C)  98.1 F (36.7 C)  TempSrc:      SpO2: 98% 100%  97%  Weight:   73.9 kg     Intake/Output Summary (Last 24 hours) at 05/17/2023 1108 Last data filed at 05/17/2023 0640 Gross per 24 hour  Intake 741.89 ml  Output --  Net 741.89 ml   Filed Weights   05/17/23 0500  Weight: 73.9 kg    Examination:  General exam: Appears calm and comfortable.  Looks chronically ill and deconditioned.  On room air. Respiratory system: Bilateral decreased breath sounds at bases with scattered crackles Cardiovascular system: S1 & S2 heard, Rate controlled Gastrointestinal system: Abdomen is nondistended, soft and nontender. Normal bowel sounds heard.  GJ tube present. Extremities: No cyanosis, clubbing, edema  Central nervous system: Alert and oriented.  Slow to respond.  Poor historian.  No focal neurological deficits. Moving  extremities Skin: No rashes, lesions or ulcers Psychiatry: Flat affect.  Not agitated.  Data Reviewed: I have personally reviewed following labs and imaging studies  CBC: Recent Labs  Lab 05/16/23 1052 05/16/23 1345 05/16/23 1423 05/17/23 0655  WBC 10.7* 10.6*  --  6.4  NEUTROABS  --  8.6*  --   --   HGB 11.8* 11.7* 12.6* 10.6*  HCT 38.1* 36.8* 37.0* 33.3*  MCV 86.2 84.2  --  82.0  PLT 212 204  --  200   Basic Metabolic Panel: Recent Labs  Lab 05/16/23 1052 05/16/23 1345 05/16/23 1423 05/16/23 2114 05/17/23 0655  NA 135  --  139  --  133*  K 3.8  --  4.1  --  3.9  CL 97*  --  97*  --  97*  CO2 25  --   --   --  26  GLUCOSE 142*  --  138*  --  150*  BUN 24*  --  26*  --  19  CREATININE 1.03  --  1.10  --  0.91  CALCIUM 9.3  --   --   --  8.8*  MG  --  1.5*  --   --  1.7  PHOS  --   --   --  3.1  --    GFR: Estimated Creatinine Clearance: 71.1 mL/min (by C-G formula based on SCr of 0.91 mg/dL). Liver Function Tests: Recent Labs  Lab 05/16/23 1052  AST 20  ALT 16  ALKPHOS 104  BILITOT 0.5  PROT 7.5  ALBUMIN 3.0*   Recent Labs  Lab 05/16/23 1052  LIPASE 31   Recent Labs  Lab 05/16/23 1345  AMMONIA <10   Coagulation Profile: Recent Labs  Lab 05/16/23 1345  INR 1.2   Cardiac Enzymes: Recent Labs  Lab 05/16/23 2114  CKTOTAL 21*   BNP (last 3 results) No results for input(s): "PROBNP" in the last 8760 hours. HbA1C: No results for input(s): "HGBA1C" in the last 72 hours. CBG: Recent Labs  Lab 05/16/23 2238 05/17/23 0033 05/17/23 0500  GLUCAP 119* 167* 155*   Lipid Profile: No results for input(s): "CHOL", "HDL", "LDLCALC", "TRIG", "CHOLHDL", "LDLDIRECT" in the last 72 hours. Thyroid Function Tests: Recent Labs    05/16/23 2114  TSH 7.026*   Anemia Panel: Recent Labs    05/16/23 2114  VITAMINB12 864  FOLATE 26.7   Sepsis Labs: Recent Labs  Lab 05/16/23 1345 05/16/23 2110  LATICACIDVEN 2.7* 1.3    No results found for  this or any previous visit (from the past 240 hour(s)).       Radiology Studies: CT HEAD WO CONTRAST  Result Date: 05/16/2023 CLINICAL DATA:  Mental status change of unknown cause. EXAM: CT HEAD WITHOUT CONTRAST TECHNIQUE: Contiguous axial images were obtained from the base of the skull through the vertex without intravenous contrast. RADIATION DOSE REDUCTION: This exam was performed according to the departmental dose-optimization program which includes automated exposure control, adjustment of the mA and/or kV according to patient size and/or use of iterative reconstruction technique. COMPARISON:  12/31/2022 FINDINGS: Brain: There is central cortical atrophy. Periventricular white matter changes are consistent with small vessel disease. There is no intra or extra-axial fluid collection or mass lesion. The basilar cisterns and ventricles have a normal appearance. There is no CT evidence for acute infarction or hemorrhage. Vascular: There is atherosclerotic calcification of the internal carotid arteries. No  hyperdense vessels. Skull: Normal. Negative for fracture or focal lesion. Sinuses/Orbits: There is increased significant opacity involving the LEFT frontal, LEFT ethmoid, LEFT sphenoid, and LEFT maxillary sinus. Air-fluid levels are present. Findings are consistent with increased, acute sinusitis, likely superimposed on chronic changes. Small LEFT mastoid effusion. Other: None. IMPRESSION: 1. Atrophy and small vessel disease. 2. No evidence for acute intracranial abnormality. 3. Increased acute sinusitis, likely superimposed on chronic changes. Electronically Signed   By: Norva Pavlov M.D.   On: 05/16/2023 13:23   DG Chest 1 View  Result Date: 05/16/2023 CLINICAL DATA:  Altered mental status.  Shortness of breath. EXAM: CHEST  1 VIEW COMPARISON:  01/15/2023 FINDINGS: Heart size normal. Stable elevation of the RIGHT hemidiaphragm. Lungs are clear. Slight rotation towards the RIGHT. IMPRESSION: No  active disease. Electronically Signed   By: Norva Pavlov M.D.   On: 05/16/2023 13:14        Scheduled Meds:  atorvastatin  40 mg Per Tube Daily   diclofenac Sodium  2 g Topical QID   enoxaparin (LOVENOX) injection  40 mg Subcutaneous Q24H   feeding supplement (OSMOLITE 1.5 CAL)  1,530 mL Per Tube Q24H   fludrocortisone  0.1 mg Per Tube Daily   free water  150 mL Per Tube 6 X Daily   hydrocortisone  10 mg Per Tube Q1400   hydrocortisone  15 mg Per Tube QAC breakfast   insulin aspart  0-6 Units Subcutaneous Q4H   levothyroxine  50 mcg Per Tube Daily   pantoprazole  40 mg Intravenous Daily   sucralfate  1 g Per Tube Q6H   Continuous Infusions:        Glade Lloyd, MD [facility] 05/17/2023, 11:08 AM

## 2023-05-17 NOTE — Progress Notes (Signed)
Daily Progress Note   Patient Name: [name]       Date: 05/17/2023 DOB: [birth year]  Age: 77 y.o. MRN#: [id number] Attending Physician: Glade Lloyd, MD Primary Care Physician: Garry Heater, MD Admit Date: 05/16/2023  Reason for Consultation/Follow-up: Establishing goals of care  Subjective: Medical records reviewed including progress notes, labs, imaging. Patient assessed at the bedside.  He denies pain or distress.  No family present during my visit.  He is agreeable to this PA calling his son for ongoing palliative discussion.  I then called [name] for continuation of yesterday's discussion.  Created space and opportunity for his thoughts and feelings on patient's current illness.  He only has a few minutes to speak at the moment, but he shares that he is satisfied with the option of going to SNF in the short-term.  He recognizes that more conversation regarding palliative versus hospice is needed in the long-term, as well as more conversation regarding patient's care needs and where this can be provided.  Outpatient palliative care was explained and offered.  Patient's [name] is agreeable and appreciative.  Questions and concerns addressed. PMT will continue to support holistically.   Length of Stay: 0   Physical Exam Vitals and nursing note reviewed.  Constitutional:      General: He is not in acute distress.    Appearance: He is ill-appearing.  Cardiovascular:     Rate and Rhythm: Normal rate.  Pulmonary:     Effort: Pulmonary effort is normal.  Neurological:     Mental Status: He is alert.  Psychiatric:        Mood and Affect: Mood normal.        Behavior: Behavior normal.            Vital Signs: BP (!) 147/83 (BP Location: Left Arm)   Pulse 71   Temp  98.1 F (36.7 C)   Resp 18   Wt 73.9 kg   SpO2 97%   BMI 20.92 kg/m  SpO2: SpO2: 97 % O2 Device: O2 Device: Room Air O2 Flow Rate:        Palliative Care Assessment & Plan   Patient Profile: 77 y.o. male  with past medical history of PE/DVT not on anticoagulation due to recurrent GI bleed, CVA not on antiplatelets due to GI  bleed, rheumatoid arthritis, and interstitial lung disease, dependent on nutrition from feeding tube due to esophageal stricture presented to ED on 05/16/2023 after PCP called today reporting hyponatremia on lab work taken last week.  Son also reports significant cognitive and functional decline over the last week.  Patient is currently in the ED and not admitted.   Of note, patient has had 3 admissions and 1 ED visit to the [facility] medical system in the last 6 months.  He is also had several admissions at [facility] over this time period.  Assessment: Goals of care conversation Failure to thrive Recurrent GI bleeds GJ tube dependence Debility, deconditioning  Recommendations/Plan: Continue full code/full scope treatment for now Patient and his son would like to proceed for SNF and outpatient palliative care referral.  [facility] consulted, assistance is appreciated Ongoing goals of care discussions pending clinical course PMT will continue to follow and support   Prognosis: Poor long-term prognosis  Discharge Planning: Skilled Nursing Facility for rehab with Palliative care service follow-up  Care plan was discussed with patient, patient's son   Total time: I spent 35 minutes in the care of the patient today in the above activities and documenting the encounter.          Lunden Stieber Jeni Salles, PA-C  Palliative Medicine Team Team phone # [phone]  Thank you for allowing the Palliative Medicine Team to assist in the care of this patient. Please utilize secure chat with additional questions, if there is no response within 30 minutes please call the above  phone number.  Palliative Medicine Team providers are available by phone from 7am to 7pm daily and can be reached through the team cell phone.  Should this patient require assistance outside of these hours, please call the patient's attending physician.

## 2023-05-17 NOTE — Evaluation (Signed)
Physical Therapy Evaluation Patient Details Name: [name] MRN: [id number] DOB: 1946/02/06 Today's Date: 05/17/2023  History of Present Illness  77 yo male presents to [facility] on 5/20 with AMS, weakness, incontinence. 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  Clinical Impression   Pt presents with generalized weakness, impaired balance with history of falls, impaired activity tolerance, and AMS. Pt to benefit from acute PT to address deficits. Pt ambulated short hallway distance with use of RW, light physical assist, and max cuing for form and safety. PT recommending post-acute rehabilitation <3 hours/day post-acutely, family in agreement. PT to progress mobility as tolerated, and will continue to follow acutely.         Recommendations for follow up therapy are one component of a multi-disciplinary discharge planning process, led by the attending physician.  Recommendations may be updated based on patient status, additional functional criteria and insurance authorization.  Follow Up Recommendations Can patient physically be transported by private vehicle: Yes     Assistance Recommended at Discharge Frequent or constant Supervision/Assistance  Patient can return home with the following  A little help with walking and/or transfers;A little help with bathing/dressing/bathroom;Direct supervision/assist for medications management;Direct supervision/assist for financial management;Help with stairs or ramp for entrance;Assist for transportation    Equipment Recommendations None recommended by PT  Recommendations for Other Services       Functional Status Assessment Patient has had a recent decline in their functional status and demonstrates the ability to make  significant improvements in function in a reasonable and predictable amount of time.     Precautions / Restrictions Precautions Precautions: Fall Restrictions Weight Bearing Restrictions: No      Mobility  Bed Mobility Overal bed mobility: Needs Assistance Bed Mobility: Supine to Sit     Supine to sit: Min assist, HOB elevated     General bed mobility comments: assist for trunk rise, increased time to scoot to EOB and sequencing cues needed.    Transfers Overall transfer level: Needs assistance Equipment used: 1 person hand held assist Transfers: Sit to/from Stand Sit to Stand: Min assist           General transfer comment: assist for rise and steady, stand x2 from EOB and chair.    Ambulation/Gait Ambulation/Gait assistance: Min Chemical engineer (Feet): 75 Feet Assistive device: IV Pole, Rolling walker (2 wheels) Gait Pattern/deviations: Step-through pattern, Decreased stride length, Trunk flexed, Staggering left Gait velocity: decr     General Gait Details: assist to steady, correct L lateral bias, and RW management. Cues for upright posture, placement in RW, hallway navigation  Stairs            Wheelchair Mobility    Modified Rankin (Stroke Patients Only)       Balance Overall balance assessment: Needs assistance, History of Falls Sitting-balance support: No upper extremity supported, Feet supported Sitting balance-Leahy Scale: Fair     Standing balance support: Single extremity supported, During functional activity Standing balance-Leahy Scale: Poor                               Pertinent Vitals/Pain Pain Assessment Pain Assessment: No/denies pain    Home Living Family/patient expects to be discharged to:: Skilled nursing facility  Prior Function Prior Level of Function : Needs assist             Mobility Comments: over the past week, pt has been bed-level. Prior to this, pt  ambulatory without AD ADLs Comments: Pt states he is independent, per pt's son he has required assist with all ADLs for the past week with issues with incontinence     Hand Dominance   Dominant Hand: Right    Extremity/Trunk Assessment   Upper Extremity Assessment Upper Extremity Assessment: Defer to OT evaluation;RUE deficits/detail;LUE deficits/detail RUE Deficits / Details: RA LUE Deficits / Details: RA    Lower Extremity Assessment Lower Extremity Assessment: Generalized weakness    Cervical / Trunk Assessment Cervical / Trunk Assessment: Normal  Communication   Communication: No difficulties  Cognition Arousal/Alertness: Awake/alert Behavior During Therapy: Flat affect Overall Cognitive Status: Impaired/Different from baseline Area of Impairment: Orientation, Attention, Following commands, Safety/judgement, Problem solving                 Orientation Level: Disoriented to, Place, Time, Situation Current Attention Level: Sustained   Following Commands: Follows one step commands with increased time Safety/Judgement: Decreased awareness of deficits, Decreased awareness of safety   Problem Solving: Slow processing, Requires verbal cues, Requires tactile cues, Decreased initiation, Difficulty sequencing General Comments: Pt states he is at [facility] in [place] and the year is 2004. Cues for placement in RW, room navigation, requires step-by-step cuing throughout        General Comments General comments (skin integrity, edema, etc.): incontinent of urine upon PT arrival to room    Exercises     Assessment/Plan    PT Assessment Patient needs continued PT services  PT Problem List Decreased strength;Decreased mobility;Decreased safety awareness;Decreased activity tolerance;Decreased cognition;Decreased balance;Decreased knowledge of use of DME       PT Treatment Interventions DME instruction;Therapeutic activities;Gait training;Therapeutic  exercise;Patient/family education;Balance training;Functional mobility training;Neuromuscular re-education    PT Goals (Current goals can be found in the Care Plan section)  Acute Rehab PT Goals Patient Stated Goal: SNF d/c PT Goal Formulation: With patient/family Time For Goal Achievement: 05/31/23 Potential to Achieve Goals: Good    Frequency Min 2X/week     Co-evaluation               AM-PAC PT "6 Clicks" Mobility  Outcome Measure Help needed turning from your back to your side while in a flat bed without using bedrails?: A Little Help needed moving from lying on your back to sitting on the side of a flat bed without using bedrails?: A Little Help needed moving to and from a bed to a chair (including a wheelchair)?: A Little Help needed standing up from a chair using your arms (e.g., wheelchair or bedside chair)?: A Little Help needed to walk in hospital room?: A Little Help needed climbing 3-5 steps with a railing? : A Lot 6 Click Score: 17    End of Session   Activity Tolerance: Patient tolerated treatment well Patient left: in chair;with call bell/phone within reach;with nursing/sitter in room;Other (comment) (on BSC having BM, RN to put pt back to bed or recliner) Nurse Communication: Mobility status PT Visit Diagnosis: Other abnormalities of gait and mobility (R26.89);Muscle weakness (generalized) (M62.81)    Time: 5784-6962 PT Time Calculation (min) (ACUTE ONLY): 28 min   Charges:   PT Evaluation $PT Eval Low Complexity: 1 Low PT Treatments $Therapeutic Activity: 8-22 mins        Emoni Yang S, PT DPT Acute  Rehabilitation Services Secure Chat Preferred  Office [phone]   Truddie Coco 05/17/2023, 10:03 AM

## 2023-05-18 DIAGNOSIS — K222 Esophageal obstruction: Secondary | ICD-10-CM | POA: Diagnosis present

## 2023-05-18 DIAGNOSIS — R5381 Other malaise: Secondary | ICD-10-CM | POA: Diagnosis present

## 2023-05-18 DIAGNOSIS — J849 Interstitial pulmonary disease, unspecified: Secondary | ICD-10-CM | POA: Diagnosis present

## 2023-05-18 DIAGNOSIS — Z7189 Other specified counseling: Secondary | ICD-10-CM | POA: Diagnosis not present

## 2023-05-18 DIAGNOSIS — R627 Adult failure to thrive: Secondary | ICD-10-CM | POA: Diagnosis present

## 2023-05-18 DIAGNOSIS — F05 Delirium due to known physiological condition: Secondary | ICD-10-CM | POA: Diagnosis not present

## 2023-05-18 DIAGNOSIS — D638 Anemia in other chronic diseases classified elsewhere: Secondary | ICD-10-CM | POA: Diagnosis present

## 2023-05-18 DIAGNOSIS — Z79890 Hormone replacement therapy (postmenopausal): Secondary | ICD-10-CM | POA: Diagnosis not present

## 2023-05-18 DIAGNOSIS — E785 Hyperlipidemia, unspecified: Secondary | ICD-10-CM | POA: Diagnosis present

## 2023-05-18 DIAGNOSIS — M549 Dorsalgia, unspecified: Secondary | ICD-10-CM | POA: Diagnosis present

## 2023-05-18 DIAGNOSIS — Z934 Other artificial openings of gastrointestinal tract status: Secondary | ICD-10-CM | POA: Diagnosis not present

## 2023-05-18 DIAGNOSIS — I10 Essential (primary) hypertension: Secondary | ICD-10-CM | POA: Diagnosis present

## 2023-05-18 DIAGNOSIS — K2100 Gastro-esophageal reflux disease with esophagitis, without bleeding: Secondary | ICD-10-CM | POA: Diagnosis present

## 2023-05-18 DIAGNOSIS — I69322 Dysarthria following cerebral infarction: Secondary | ICD-10-CM | POA: Diagnosis not present

## 2023-05-18 DIAGNOSIS — E271 Primary adrenocortical insufficiency: Secondary | ICD-10-CM | POA: Diagnosis present

## 2023-05-18 DIAGNOSIS — I69331 Monoplegia of upper limb following cerebral infarction affecting right dominant side: Secondary | ICD-10-CM | POA: Diagnosis not present

## 2023-05-18 DIAGNOSIS — E871 Hypo-osmolality and hyponatremia: Secondary | ICD-10-CM | POA: Diagnosis present

## 2023-05-18 DIAGNOSIS — Z515 Encounter for palliative care: Secondary | ICD-10-CM | POA: Diagnosis not present

## 2023-05-18 DIAGNOSIS — G8929 Other chronic pain: Secondary | ICD-10-CM | POA: Diagnosis present

## 2023-05-18 DIAGNOSIS — E1165 Type 2 diabetes mellitus with hyperglycemia: Secondary | ICD-10-CM | POA: Diagnosis present

## 2023-05-18 DIAGNOSIS — E876 Hypokalemia: Secondary | ICD-10-CM | POA: Diagnosis present

## 2023-05-18 DIAGNOSIS — R32 Unspecified urinary incontinence: Secondary | ICD-10-CM | POA: Diagnosis present

## 2023-05-18 DIAGNOSIS — J019 Acute sinusitis, unspecified: Secondary | ICD-10-CM | POA: Diagnosis present

## 2023-05-18 DIAGNOSIS — R410 Disorientation, unspecified: Secondary | ICD-10-CM | POA: Diagnosis not present

## 2023-05-18 DIAGNOSIS — M0510 Rheumatoid lung disease with rheumatoid arthritis of unspecified site: Secondary | ICD-10-CM | POA: Diagnosis present

## 2023-05-18 DIAGNOSIS — E039 Hypothyroidism, unspecified: Secondary | ICD-10-CM | POA: Diagnosis present

## 2023-05-18 LAB — BASIC METABOLIC PANEL
Anion gap: 8 (ref 5–15)
BUN: 18 mg/dL (ref 8–23)
CO2: 24 mmol/L (ref 22–32)
Calcium: 8.7 mg/dL — ABNORMAL LOW (ref 8.9–10.3)
Chloride: 102 mmol/L (ref 98–111)
Creatinine, Ser: 0.91 mg/dL (ref 0.61–1.24)
GFR, Estimated: >60 mL/min (ref >60)
Glucose, Bld: 113 mg/dL — ABNORMAL HIGH (ref 70–99)
Potassium: 3.1 mmol/L — ABNORMAL LOW (ref 3.5–5.1)
Sodium: 134 mmol/L — ABNORMAL LOW (ref 135–145)

## 2023-05-18 LAB — GLUCOSE, CAPILLARY
Glucose-Capillary: 116 mg/dL — ABNORMAL HIGH (ref 70–99)
Glucose-Capillary: 112 mg/dL — ABNORMAL HIGH (ref 70–99)
Glucose-Capillary: 116 mg/dL — ABNORMAL HIGH (ref 70–99)
Glucose-Capillary: 150 mg/dL — ABNORMAL HIGH (ref 70–99)
Glucose-Capillary: 128 mg/dL — ABNORMAL HIGH (ref 70–99)
Glucose-Capillary: 203 mg/dL — ABNORMAL HIGH (ref 70–99)

## 2023-05-18 LAB — MAGNESIUM: Magnesium: 1.6 mg/dL — ABNORMAL LOW (ref 1.7–2.4)

## 2023-05-18 LAB — PHOSPHORUS: Phosphorus: 3.7 mg/dL (ref 2.5–4.6)

## 2023-05-18 MED ORDER — PANTOPRAZOLE SODIUM 40 MG PO TBEC
40.0000 mg | DELAYED_RELEASE_TABLET | Freq: Two times a day (BID) | ORAL | Status: DC
  Filled 2023-05-18: qty 1

## 2023-05-18 MED ORDER — LEVOTHYROXINE SODIUM 75 MCG PO TABS
75.0000 ug | ORAL_TABLET | Freq: Every day | ORAL | Status: DC
  Administered 2023-05-19 – 2023-05-23 (×5): 75 ug
  Filled 2023-05-18 (×5): qty 1

## 2023-05-18 MED ORDER — FAMOTIDINE 20 MG PO TABS
40.0000 mg | ORAL_TABLET | Freq: Every day | ORAL | Status: DC
  Filled 2023-05-18: qty 2

## 2023-05-18 MED ORDER — POTASSIUM CHLORIDE 2 MEQ/ML IV SOLN: INTRAVENOUS | Status: DC

## 2023-05-18 MED ORDER — FAMOTIDINE 20 MG PO TABS
40.0000 mg | ORAL_TABLET | Freq: Every day | ORAL | Status: DC
  Administered 2023-05-18 – 2023-05-23 (×6): 40 mg
  Filled 2023-05-18 (×6): qty 2

## 2023-05-18 MED ORDER — PANTOPRAZOLE SODIUM 40 MG IV SOLR
40.0000 mg | Freq: Two times a day (BID) | INTRAVENOUS | Status: DC
  Administered 2023-05-18 – 2023-05-23 (×11): 40 mg via INTRAVENOUS
  Filled 2023-05-18 (×11): qty 10

## 2023-05-18 MED ORDER — POTASSIUM CHLORIDE 2 MEQ/ML IV SOLN
INTRAVENOUS | Status: DC
  Filled 2023-05-18 (×3): qty 1000

## 2023-05-18 MED ORDER — MAGNESIUM SULFATE 2 GM/50ML IV SOLN
2.0000 g | Freq: Once | INTRAVENOUS | Status: AC
  Administered 2023-05-18: 2 g via INTRAVENOUS
  Filled 2023-05-18: qty 50

## 2023-05-18 NOTE — Progress Notes (Signed)
Daily Progress Note   Patient Name: [name]       Date: 05/18/2023 DOB: August 20, 1946  Age: 77 y.o. MRN#: [id number] Attending Physician: Rhetta Mura, MD Primary Care Physician: Garry Heater, MD Admit Date: 05/16/2023  Reason for Consultation/Follow-up: Establishing goals of care  Subjective: I have reviewed medical records including EPIC notes, MAR, and labs. Received report from primary RN - no acute concerns. RN reports patient remains confused.   Went to visit patient at bedside - no family/visitors present. Patient was lying in bed asleep - I did not attempt to wake him to preserve comfort. No signs or non-verbal gestures of pain or discomfort noted. No respiratory distress, increased work of breathing, or secretions noted.   1:21 PM Called son/[name] - emotional support provided. Validated that goals are for patient's discharge to rehab with outpatient PC to follow. We discussed how it will be important for ongoing advanced care planning in case patient does not rehab well enough to return to independent living as he was prior to admission. Recommend considering assisted living vs LTC care vs memory care in context of +/- hospice. [name] states he will monitor patient's progress at rehab and make stepwise decisions, which is very reasonable. He understands outpatient PC can provide ongoing support and GOC discussions.   [name] expresses appreciation for PMT assistance.  All questions and concerns addressed. Encouraged to call with questions and/or concerns. PMT card previously provided.  Length of Stay: 0  Current Medications: Scheduled Meds:   diclofenac Sodium  2 g Topical QID   enoxaparin (LOVENOX) injection  40 mg Subcutaneous Q24H   famotidine  40 mg Per Tube Daily    feeding supplement (OSMOLITE 1.5 CAL)  1,530 mL Per Tube Q24H   feeding supplement (PROSource TF20)  60 mL Per Tube Daily   fludrocortisone  0.1 mg Per Tube Daily   free water  150 mL Per Tube 6 X Daily   hydrocortisone  10 mg Per Tube Q1400   hydrocortisone  15 mg Per Tube QAC breakfast   insulin aspart  0-6 Units Subcutaneous Q4H   [START ON 05/19/2023] levothyroxine  75 mcg Per Tube Daily   pantoprazole (PROTONIX) IV  40 mg Intravenous Q12H   sucralfate  1 g Per Tube Q6H    Continuous Infusions:  dextrose 5%  lactated ringers 1,000 mL with potassium chloride 60 mEq infusion 100 mL/hr at 05/18/23 1010    PRN Meds: acetaminophen **OR** acetaminophen, ondansetron **OR** ondansetron (ZOFRAN) IV, oxyCODONE, senna-docusate  Physical Exam Vitals and nursing note reviewed.  Constitutional:      General: He is not in acute distress. Pulmonary:     Effort: No respiratory distress.  Skin:    General: Skin is warm and dry.  Neurological:     Comments: asleep             Vital Signs: BP 113/69 (BP Location: Left Arm)   Pulse 72   Temp 97.7 F (36.5 C) (Oral)   Resp 18   Wt 73.8 kg   SpO2 98%   BMI 20.89 kg/m  SpO2: SpO2: 98 % O2 Device: O2 Device: Room Air O2 Flow Rate:    Intake/output summary:  Intake/Output Summary (Last 24 hours) at 05/18/2023 1320 Last data filed at 05/18/2023 0740 Gross per 24 hour  Intake 471.7 ml  Output 1800 ml  Net -1328.3 ml   LBM: Last BM Date :  (prior to admission) Baseline Weight: Weight: 73.9 kg Most recent weight: Weight: 73.8 kg       Palliative Assessment/Data: PPS 50-60% - with nocturnal tube feeds      Patient Active Problem List   Diagnosis Date Noted   Failure to thrive in adult 05/16/2023   History of pulmonary embolism 05/16/2023   Presence of internal carotid stent 01/28/2023   Primary adrenal insufficiency (HCC) 01/28/2023   Protein-calorie malnutrition, severe 01/21/2023   Excessive oral secretions 01/19/2023    PEG (percutaneous endoscopic gastrostomy) status (HCC) 01/17/2023   Long term current use of anticoagulant therapy 01/17/2023   Debility 01/17/2023   Advanced care planning/counseling discussion 01/17/2023   Antiplatelet or antithrombotic long-term use 01/15/2023   Anemia, posthemorrhagic, acute 01/15/2023   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/06/2023   Severe malnutrition (HCC) 12/30/2022   Sepsis without acute organ dysfunction (HCC) 12/28/2022   Influenza A 12/28/2022   History of CVA (cerebrovascular accident) 12/28/2022   Acute encephalopathy 12/27/2022   Acute esophagitis    Abdominal pain, epigastric    Closed left fibular fracture 08/22/2022   Reflux esophagitis 08/22/2022   Pressure injury of skin 08/20/2022   Upper GI bleed 08/19/2022   UGIB (upper gastrointestinal bleed) 08/18/2022   Pancytopenia (HCC)    Immunosuppression (HCC)    ILD (interstitial lung disease) (HCC)    Diarrhea    DIC (disseminated intravascular coagulation) (HCC)    AKI (acute kidney injury) (HCC)    COVID-19 01/26/2021   Rheumatoid arthritis (HCC) 07/22/2015   Mucositis due to chemotherapy 06/25/2015   Mucositis due to radiation therapy 06/25/2015   SBO (small bowel obstruction) (HCC) 05/29/2015   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   H/O drug therapy 05/12/2015   Abnormal mental state 04/22/2015   Fast heart beat 04/22/2015   Febrile 04/18/2015   Candida esophagitis (HCC) 04/16/2015   Dysphagia, pharyngoesophageal phase 04/16/2015   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   Fall 05/24/2014   Traumatic pneumothorax 05/23/2014   Diabetes mellitus, type 2 (HCC) 05/01/2014   Perirectal abscess 07/31/2013   Hemorrhoids 07/31/2013   Lentigines 11/13/2012   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  Son also reports significant cognitive and functional decline over the last week.  Patient is currently in the ED and not admitted.   Of note, patient has had 3 admissions and 1 ED visit to the [facility] medical system in the last 6 months.  He is also had several admissions at [facility] over this time period.  Assessment: Principal Problem:   Failure to thrive in adult Active Problems:   Rheumatoid arthritis (HCC)   Benign esophageal stricture   Cancer of base of tongue (HCC)   ILD (interstitial lung disease) (HCC)   Reflux esophagitis   Acute encephalopathy   History of CVA (cerebrovascular accident)   Primary adrenal insufficiency (HCC)   History of pulmonary embolism   Concern about end of life  Recommendations/Plan: Continue full code/full scope treatment Discharge to rehab with outpatient Palliative Care to follow - TOC previously consulted Encouraged advanced care planning with family PMT will continue to follow and support holistically   Goals of Care and Additional Recommendations: Limitations on Scope of Treatment: Full Scope Treatment  Code Status:    Code Status Orders  (From admission, onward)           Start     Ordered   05/16/23 2010   Full code  Continuous       Question:  By:  Answer:  Consent: discussion documented in EHR   05/16/23 2012           Code Status History     Date Active Date Inactive Code Status Order ID Comments User Context   01/15/2023 1347 01/29/2023 0208 Full Code [id number]  Lanier Clam, NP Inpatient   01/06/2023 1630 01/15/2023 1325 Full Code [id number]  Jacquelynn Cree PA-C Inpatient   12/27/2022 1320 01/06/2023 1559 Full Code [id number]  Rana Snare, DO ED   08/18/2022 1956 08/27/2022 2102 Full Code [id number]  Evlyn Kanner, MD ED   01/26/2021 1532 02/05/2021 2212 Full Code [id number]  Elige Radon, MD ED   08/14/2012 1815 08/16/2012 1826 Full Code [id number]  Kelby Aline, RN Inpatient       Prognosis:  Poor long term   Discharge Planning: Skilled Nursing Facility for rehab with Palliative care service follow-up  Care plan was discussed with primary RN, patient's son  Thank you for allowing the Palliative Medicine Team to assist in the care of this patient.   Haskel Khan, NP  Please contact Palliative Medicine Team phone at [phone] for questions and concerns.   *Portions of this note are a verbal dictation therefore any spelling and/or grammatical errors are due to the "[employer]" system interpretation.

## 2023-05-18 NOTE — Progress Notes (Signed)
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 not on anticoagulation Recent hospitalization 5/8-5/10/2023 at [facility] 2/2 acute UGIB + blood in GJ tube hemoglobin baseline around 11-was at 9.4 there-Rx 1 unit PRBC-endoscopy deferred-GIB stopped 5/9 did not recur-Rx on DC PPI twice daily famotidine Carafate- - Hospitalization he had delirium which seems to be a common occurrence for him  Presented [facility] ED generalized weakness confusion incontinence CT head, CXR negative for anything acute Magnesium 1.5 BUNs/creatinine 26/1.1  Hospital-Problem based course  Delirium hospital-acquired A common occurrence for him when hospitalized Seems intermittent according to nursing and my observations B12 folate within normal limits TSH slightly elevated --- unclear how to interpret this in a hospitalized patient--nonetheless since it has been checked increase levothyroxine from 50-->75 daily Needs recheck TSH in 2 to 3 weeks it seems like he was highly functional about 8 months ago able to do arithmetic and after hospitalization at [facility] he went home and seemed "normal" but then developed progressive confusion at home  Stroke 12/2022 Not a candidate really for ASA  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 but change dosage to twice daily not once daily Add back famotidine  Hypokalemia Magnesium replaced IV 2 g, give D5 LR + K 60 mill equivalents@100  cc/H Recheck all labs a.m.  Primary adrenal insufficiency on steroids/Florinef Continue home doses of 15 mg a.m., 10 mg at 2 PM Continue Florinef 0.1 daily Outpatient follow-up with endocrine/rheumatologist at [facility]  Hyperglycemia Continue sliding scale coverage PTA metformin 500 daily held  Seropositive rheumatoid arthritis follows at [facility] Abatacept as per outpatient physician Dr. Gaye Pollack Continue folic acid   DVT prophylaxis: Lovenox Code Status: Full CODE STATUS Family Communication: lee Gallop called on phone--lives with wife, was in ILF prior Disposition:  Status is: Observation The patient will require care spanning > 2 midnights and should be moved to inpatient because:   Will need SNF dependant on what therapy says---   Subjective:  Slight confusion Tells me he is in [place] but corrects himself and tells me he is currently in [place] Knows his son's name and can tylectomy Gets confused about the details of his medical care Tells me he has rheumatoid but neglected to tell me about his PEG tube  Objective: Vitals:   05/17/23 1711 05/17/23 2048 05/18/23 0433 05/18/23 0500  BP: (!) 155/94 (!) 160/84 113/69   Pulse: 87 86 72   Resp: 17 18 18    Temp: 98 F (36.7 C) (!) 97.5 F (36.4 C) 97.7 F (36.5 C)   TempSrc:  Oral Oral   SpO2: 99% 100% 98%   Weight:    73.8 kg    Intake/Output  Summary (Last 24 hours) at 05/18/2023 0715 Last data filed at 05/17/2023 2248 Gross per 24 hour  Intake 660.75 ml  Output 1800 ml  Net -1139.25 ml   Filed Weights   05/17/23 0500 05/18/23 0500   Weight: 73.9 kg 73.8 kg    Examination:  Younger than stated age no icterus no pallor neck soft supple Chest is clear no wheeze rales rhonchi S1-S2?  Murmur Abdomen is soft with PEG tube in place No lower extremity edema ROM intact moving 4 limbs equally   Data Reviewed: personally reviewed   CBC    Component Value Date/Time   WBC 6.4 05/17/2023 0655   RBC 4.06 (L) 05/17/2023 0655   HGB 10.6 (L) 05/17/2023 0655   HGB 13.7 11/20/2014 1140   HCT 33.3 (L) 05/17/2023 0655   HCT 41.8 11/20/2014 1140   PLT 200 05/17/2023 0655   PLT 172 11/20/2014 1140   MCV 82.0 05/17/2023 0655   MCV 100 11/20/2014 1140   MCH 26.1 05/17/2023 0655   MCHC 31.8 05/17/2023 0655   RDW 16.4 (H) 05/17/2023 0655   RDW 15.0 (H) 11/20/2014 1140   LYMPHSABS 0.6 (L) 05/16/2023 1345   LYMPHSABS 1.0 11/20/2014 1140   MONOABS 1.0 05/16/2023 1345   MONOABS 0.5 11/20/2014 1140   EOSABS 0.3 05/16/2023 1345   EOSABS 0.0 11/20/2014 1140   BASOSABS 0.1 05/16/2023 1345   BASOSABS 0.1 11/20/2014 1140      Latest Ref Rng & Units 05/18/2023    4:44 AM 05/17/2023    6:55 AM 05/16/2023    2:23 PM  CMP  Glucose 70 - 99 mg/dL 161  096  045   BUN 8 - 23 mg/dL 18  19  26    Creatinine 0.61 - 1.24 mg/dL 4.09  8.11  9.14   Sodium 135 - 145 mmol/L 134  133  139   Potassium 3.5 - 5.1 mmol/L 3.1  3.9  4.1   Chloride 98 - 111 mmol/L 102  97  97   CO2 22 - 32 mmol/L 24  26    Calcium 8.9 - 10.3 mg/dL 8.7  8.8       Radiology Studies: CT HEAD WO CONTRAST  Result Date: 05/16/2023 CLINICAL DATA:  Mental status change of unknown cause. EXAM: CT HEAD WITHOUT CONTRAST TECHNIQUE: Contiguous axial images were obtained from the base of the skull through the vertex without intravenous contrast. RADIATION DOSE REDUCTION: This exam was performed according to the departmental dose-optimization program which includes automated exposure control, adjustment of the mA and/or kV according to patient size and/or use of iterative  reconstruction technique. COMPARISON:  12/31/2022 FINDINGS: Brain: There is central cortical atrophy. Periventricular white matter changes are consistent with small vessel disease. There is no intra or extra-axial fluid collection or mass lesion. The basilar cisterns and ventricles have a normal appearance. There is no CT evidence for acute infarction or hemorrhage. Vascular: There is atherosclerotic calcification of the internal carotid arteries. No hyperdense vessels. Skull: Normal. Negative for fracture or focal lesion. Sinuses/Orbits: There is increased significant opacity involving the LEFT frontal, LEFT ethmoid, LEFT sphenoid, and LEFT maxillary sinus. Air-fluid levels are present. Findings are consistent with increased, acute sinusitis, likely superimposed on chronic changes. Small LEFT mastoid effusion. Other: None. IMPRESSION: 1. Atrophy and small vessel disease. 2. No evidence for acute intracranial abnormality. 3. Increased acute sinusitis, likely superimposed on chronic changes. Electronically Signed   By: Norva Pavlov M.D.   On: 05/16/2023 13:23   DG Chest  1 View  Result Date: 05/16/2023 CLINICAL DATA:  Altered mental status.  Shortness of breath. EXAM: CHEST  1 VIEW COMPARISON:  01/15/2023 FINDINGS: Heart size normal. Stable elevation of the RIGHT hemidiaphragm. Lungs are clear. Slight rotation towards the RIGHT. IMPRESSION: No active disease. Electronically Signed   By: Norva Pavlov M.D.   On: 05/16/2023 13:14     Scheduled Meds:  diclofenac Sodium  2 g Topical QID   enoxaparin (LOVENOX) injection  40 mg Subcutaneous Q24H   famotidine  40 mg Oral Daily   feeding supplement (OSMOLITE 1.5 CAL)  1,530 mL Per Tube Q24H   feeding supplement (PROSource TF20)  60 mL Per Tube Daily   fludrocortisone  0.1 mg Per Tube Daily   free water  150 mL Per Tube 6 X Daily   hydrocortisone  10 mg Per Tube Q1400   hydrocortisone  15 mg Per Tube QAC breakfast   insulin aspart  0-6 Units  Subcutaneous Q4H   [START ON 05/19/2023] levothyroxine  75 mcg Per Tube Daily   pantoprazole  40 mg Oral BID   sucralfate  1 g Per Tube Q6H   Continuous Infusions:  dextrose 5% lactated ringers 1,000 mL with potassium chloride 60 mEq infusion       LOS: 0 days   Time spent: 37  Rhetta Mura, MD [facility] To contact the attending provider between 7A-7P or the covering provider during after hours 7P-7A, please log into the web site [email or web address] and access using universal [place] password for that web site. If you do not have the password, please call the hospital operator.  05/18/2023, 7:15 AM

## 2023-05-18 NOTE — NC FL2 (Signed)
[place] MEDICAID FL2 LEVEL OF CARE FORM     IDENTIFICATION  Patient Name: [name] Birthdate: [birth year] Sex: male Admission Date (Current Location): 05/16/2023  [facility] and [place] Number:  Producer, television/film/video and Address:  [facility] [place], [address], [place], [place] [address]      Provider Number: [id number]  Attending Physician Name and Address:  Rhetta Mura, MD  Relative Name and Phone Number:       Current Level of Care: Hospital Recommended Level of Care: Skilled Nursing Facility Prior Approval Number:    Date Approved/Denied:   PASRR Number: [id number]  Discharge Plan: SNF    Current Diagnoses: Patient Active Problem List   Diagnosis Date Noted   Failure to thrive in adult 05/16/2023   History of pulmonary embolism 05/16/2023   Presence of internal carotid stent 01/28/2023   Primary adrenal insufficiency (HCC) 01/28/2023   Protein-calorie malnutrition, severe 01/21/2023   Excessive oral secretions 01/19/2023   PEG (percutaneous endoscopic gastrostomy) status (HCC) 01/17/2023   Long term current use of anticoagulant therapy 01/17/2023   Debility 01/17/2023   Advanced care planning/counseling discussion 01/17/2023   Antiplatelet or antithrombotic long-term use 01/15/2023   Anemia, posthemorrhagic, acute 01/15/2023   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/06/2023   Severe malnutrition (HCC) 12/30/2022   Sepsis without acute organ dysfunction (HCC) 12/28/2022   Influenza A 12/28/2022   History of CVA (cerebrovascular accident) 12/28/2022   Acute encephalopathy 12/27/2022   Acute esophagitis    Abdominal pain, epigastric    Closed left fibular fracture 08/22/2022   Reflux esophagitis 08/22/2022   Pressure injury of skin 08/20/2022   Upper GI bleed 08/19/2022   UGIB (upper gastrointestinal bleed) 08/18/2022   Pancytopenia (HCC)    Immunosuppression (HCC)    ILD (interstitial lung  disease) (HCC)    Diarrhea    DIC (disseminated intravascular coagulation) (HCC)    AKI (acute kidney injury) (HCC)    COVID-19 01/26/2021   Rheumatoid arthritis (HCC) 07/22/2015   Mucositis due to chemotherapy 06/25/2015   Mucositis due to radiation therapy 06/25/2015   SBO (small bowel obstruction) (HCC) 05/29/2015   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   H/O drug therapy 05/12/2015   Abnormal mental state 04/22/2015   Fast heart beat 04/22/2015   Febrile 04/18/2015   Candida esophagitis (HCC) 04/16/2015   Dysphagia, pharyngoesophageal phase 04/16/2015   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   Fall 05/24/2014   Traumatic pneumothorax 05/23/2014   Diabetes mellitus, type 2 (HCC) 05/01/2014   Perirectal abscess 07/31/2013   Hemorrhoids 07/31/2013   Lentigines 11/13/2012   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   Atypical nevus 08/29/2012   Malignant melanoma in situ (HCC) 08/29/2012   Polypharmacy 08/23/2012   Dyspnea 08/14/2012   Rheumatoid arthritis(714.0) 08/14/2012   HTN (hypertension) 08/14/2012   GERD (gastroesophageal reflux disease) 08/14/2012   Emphysema 08/14/2012   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   Duodenal ulcer with hemorrhage 10/15/2011   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   Benign esophageal stricture 10/15/2011    Orientation RESPIRATION BLADDER Height & Weight     Self, Place  Normal Continent (occasional incontinence) Weight: 162 lb 11.2 oz (73.8 kg) Height:     BEHAVIORAL SYMPTOMS/MOOD NEUROLOGICAL BOWEL NUTRITION STATUS      Continent   PEG Tube  AMBULATORY STATUS COMMUNICATION OF NEEDS Skin   Limited Assist Verbally Normal  Personal Care Assistance Level of Assistance  Bathing, Feeding, Dressing Bathing Assistance: Limited assistance Feeding assistance: Independent Dressing Assistance: Limited assistance     Functional Limitations Info  Sight, Hearing, Speech  Sight Info: Impaired Hearing Info: Impaired Speech Info: Adequate    SPECIAL CARE FACTORS FREQUENCY  PT (By licensed PT), OT (By licensed OT)     PT Frequency: 5x/week OT Frequency: 5x/week            Contractures Contractures Info: 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)    Additional Factors Info  Code Status, Allergies Code Status Info: FULL Allergies Info: No Known Allergies           Current Medications (05/18/2023):  This is the current hospital active medication list Current Facility-Administered Medications  Medication Dose Route Frequency Provider Last Rate Last Admin   acetaminophen (TYLENOL) tablet 650 mg  650 mg Per Tube Q6H PRN Opyd, Lavone Neri, MD       Or   acetaminophen (TYLENOL) suppository 650 mg  650 mg Rectal Q6H PRN Opyd, Lavone Neri, MD       dextrose 5% lactated ringers 1,000 mL with potassium chloride 60 mEq infusion   Intravenous Continuous Rhetta Mura, MD 100 mL/hr at 05/18/23 1010 New Bag at 05/18/23 1010   diclofenac Sodium (VOLTAREN) 1 % topical gel 2 g  2 g Topical QID Opyd, Lavone Neri, MD       enoxaparin (LOVENOX) injection 40 mg  40 mg Subcutaneous Q24H Opyd, Lavone Neri, MD   40 mg at 05/17/23 2120   famotidine (PEPCID) tablet 40 mg  40 mg Per Tube Daily Rhetta Mura, MD       feeding supplement (OSMOLITE 1.5 CAL) liquid 1,530 mL  1,530 mL Per Tube Q24H Glade Lloyd, MD       feeding supplement (PROSource TF20) liquid 60 mL  60 mL Per Tube Daily Hanley Ben, Kshitiz, MD   60 mL at 05/18/23 0958   fludrocortisone (FLORINEF) tablet 0.1 mg  0.1 mg Per Tube Daily Opyd, Lavone Neri, MD   0.1 mg at 05/18/23 1610   free water 150 mL  150 mL Per Tube 6 X Daily Opyd, Lavone Neri, MD   150 mL at 05/18/23 0740   hydrocortisone (CORTEF) tablet 10 mg  10 mg Per Tube Q1400 Opyd, Lavone Neri, MD   10 mg at 05/17/23 1516   hydrocortisone (CORTEF) tablet 15 mg  15 mg Per Tube QAC breakfast Opyd, Lavone Neri, MD   15 mg at 05/18/23 0740   insulin  aspart (novoLOG) injection 0-6 Units  0-6 Units Subcutaneous Q4H Opyd, Lavone Neri, MD   1 Units at 05/17/23 1811   [START ON 05/19/2023] levothyroxine (SYNTHROID) tablet 75 mcg  75 mcg Per Tube Daily Rhetta Mura, MD       ondansetron (ZOFRAN) tablet 4 mg  4 mg Oral Q6H PRN Opyd, Lavone Neri, MD       Or   ondansetron (ZOFRAN) injection 4 mg  4 mg Intravenous Q6H PRN Opyd, Lavone Neri, MD       oxyCODONE (Oxy IR/ROXICODONE) immediate release tablet 5 mg  5 mg Per Tube Q6H PRN Opyd, Lavone Neri, MD       pantoprazole (PROTONIX) injection 40 mg  40 mg Intravenous Q12H Samtani, Jai-Gurmukh, MD       senna-docusate (Senokot-S) tablet 1 tablet  1 tablet Per Tube QHS PRN Opyd, Lavone Neri, MD       sucralfate (CARAFATE) 1 GM/10ML suspension 1  g  1 g Per Tube Q6H Opyd, Lavone Neri, MD   1 g at 05/18/23 0547     Discharge Medications: Please see discharge summary for a list of discharge medications.  Relevant Imaging Results:  Relevant Lab Results:   Additional Information SSN: [id number]  [name], [place]

## 2023-05-19 DIAGNOSIS — R627 Adult failure to thrive: Secondary | ICD-10-CM | POA: Diagnosis not present

## 2023-05-19 LAB — GLUCOSE, CAPILLARY
Glucose-Capillary: 126 mg/dL — ABNORMAL HIGH (ref 70–99)
Glucose-Capillary: 147 mg/dL — ABNORMAL HIGH (ref 70–99)
Glucose-Capillary: 162 mg/dL — ABNORMAL HIGH (ref 70–99)
Glucose-Capillary: 168 mg/dL — ABNORMAL HIGH (ref 70–99)
Glucose-Capillary: 179 mg/dL — ABNORMAL HIGH (ref 70–99)
Glucose-Capillary: 189 mg/dL — ABNORMAL HIGH (ref 70–99)
Glucose-Capillary: 226 mg/dL — ABNORMAL HIGH (ref 70–99)

## 2023-05-19 LAB — BASIC METABOLIC PANEL
Anion gap: 8 (ref 5–15)
BUN: 20 mg/dL (ref 8–23)
CO2: 25 mmol/L (ref 22–32)
Calcium: 8.4 mg/dL — ABNORMAL LOW (ref 8.9–10.3)
Chloride: 103 mmol/L (ref 98–111)
Creatinine, Ser: 0.87 mg/dL (ref 0.61–1.24)
GFR, Estimated: >60 mL/min (ref >60)
Glucose, Bld: 157 mg/dL — ABNORMAL HIGH (ref 70–99)
Potassium: 3.9 mmol/L (ref 3.5–5.1)
Sodium: 136 mmol/L (ref 135–145)

## 2023-05-19 MED ORDER — FREE WATER
200.0000 mL | Freq: Every day | Status: DC
  Administered 2023-05-19 – 2023-05-23 (×22): 200 mL

## 2023-05-19 MED ORDER — AMLODIPINE BESYLATE 5 MG PO TABS
5.0000 mg | ORAL_TABLET | Freq: Every day | ORAL | Status: DC
  Administered 2023-05-19 – 2023-05-23 (×5): 5 mg via ORAL
  Filled 2023-05-19 (×5): qty 1

## 2023-05-19 NOTE — TOC Progression Note (Signed)
Transition of Care [facility]) - Progression Note    Patient Details  Name: [name] MRN: [id number] Date of Birth: Jan 02, 1946  Transition of [facility]) CM/SW Contact  [name], [place] Phone Number: 05/19/2023, 11:53 AM  Clinical Narrative:     CSW contacted pt's son, [name] regarding SNF choice bed offers as pt was too disoriented.   CSW sent bed offer list via email and stated he, his brother and pt's wife would review list and make a decision as a family today.   CSW will follow up regarding decision.   TOC will continue to follow.   Expected Discharge Plan: Skilled Nursing Facility Barriers to Discharge: Continued Medical Work up, SNF Pending bed offer  Expected Discharge Plan and Services In-house Referral: Clinical Social Work   Post Acute Care Choice: Skilled Nursing Facility Living arrangements for the past 2 months: Single Family Home                                       Social Determinants of Health (SDOH) Interventions SDOH Screenings   Food Insecurity: No Food Insecurity (12/28/2022)  Housing: Low Risk  (12/28/2022)  Transportation Needs: No Transportation Needs (12/28/2022)  Utilities: Not At Risk (12/28/2022)  Tobacco Use: Medium Risk (05/16/2023)    Readmission Risk Interventions    01/21/2023    9:13 AM  Readmission Risk Prevention Plan  Transportation Screening Complete  Medication Review (RN Care Manager) Complete  PCP or Specialist appointment within 3-5 days of discharge Complete  HRI or Home Care Consult Complete  SW Recovery Care/Counseling Consult Complete  Palliative Care Screening Not Applicable  Skilled Nursing Facility Complete

## 2023-05-19 NOTE — Progress Notes (Signed)
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 not on anticoagulation Recent hospitalization 5/8-5/10/2023 at [facility] 2/2 acute UGIB + blood in GJ tube hemoglobin baseline around 11-was at 9.4 there-Rx 1 unit PRBC-endoscopy deferred-GIB stopped 5/9 did not recur-Rx on DC PPI twice daily famotidine Carafate- - Hospitalization he had delirium which seems to be a common occurrence for him  Presented [facility] ED generalized weakness confusion incontinence CT head, CXR negative for anything acute Magnesium 1.5 BUNs/creatinine 26/1.1  Hospital-Problem based course  Delirium hospital-acquired A common occurrence for him when hospitalized Seems intermittent --metabolic workup was negative  However TSH slightly elevated --- unclear how to interpret this in a hospitalized patient--nonetheless since it has been checked increase levothyroxine from 50-->75 daily Needs recheck TSH in 2 to 3 weeks Allegedly highly functional about 8 months ago able to do arithmetic and after hospitalization at [facility] he went home and seemed "normal" but then developed progressive confusion at home  Stroke 12/2022 Not a candidate really for ASA secondary to GI bleeding  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 Tube feeds to continue as per nutritionist with  Osmolite@goal  85 cc/H+ Prosource Needs to reach goal prior to discharge--- Free water as below  Recurrent esophagitis worsened by steroids Continue Carafate 1 g every 6 hourly, Protonix cannot be given crushed therefore changed back to IV Add back famotidine  Hypokalemia Magnesium replaced IV 2 g stop saline today, increase free water to 200 x6 times daily  Elevated blood pressure Start amlodipine 5-titrate meds as an outpatient  Primary adrenal insufficiency on steroids/Florinef Continue home doses of 15 mg a.m., 10 mg at 2 PM Continue Florinef 0.1 daily Outpatient follow-up with endocrine/rheumatologist at [facility]  History DVT/PE Not on anticoagulation secondary to recurrent GI bleeds  Hyperglycemia Continue sliding scale coverage PTA metformin 500 daily held resume at discharge time  Seropositive rheumatoid arthritis follows at [facility] Abatacept as per outpatient physician Dr. Gaye Pollack Continue folic acid   DVT prophylaxis: Lovenox Code Status: Full CODE STATUS Family Communication: No family present at bedside today Disposition:  Status is: Observation The patient will require care spanning > 2 midnights and should be moved to inpatient because:   Will need SNF when bed is available-he is stabilized for discharge from my perspective  Subjective:  Mild confusion persists-he is quite reoriented well however and is pleasant  Objective: Vitals:   05/18/23 1616 05/18/23 2014 05/19/23 0505 05/19/23 0816  BP: (!) 155/69 (!) 149/66 (!) 140/66 (!) 150/75  Pulse: 68 68 64 66  Resp: 16 17 18 16   Temp: 97.6 F (36.4 C) (!) 97.5 F (36.4 C) 98.5 F (36.9 C) 98.6 F (37 C)  TempSrc: Oral  Oral   SpO2: 97% 98% 98% 98%  Weight:        Intake/Output Summary (Last 24 hours) at 05/19/2023 1614 Last data filed at 05/19/2023 1307 Gross per 24 hour  Intake 1051.27 ml  Output 800 ml  Net 251.27 ml    Filed Weights   05/17/23 0500 05/18/23 0500  Weight: 73.9 kg 73.8 kg     Examination:  Alert coherent X1 slightly confused-slightly disheveled Chest is clear no wheeze S1-S2 no murmur Abdomen soft feeding tube in place infusing No lower extremity edema    Data Reviewed: personally reviewed   CBC    Component Value Date/Time   WBC 6.4 05/17/2023 0655   RBC 4.06 (L) 05/17/2023 0655   HGB 10.6 (L) 05/17/2023 0655   HGB 13.7 11/20/2014 1140   HCT 33.3 (L) 05/17/2023 0655   HCT 41.8 11/20/2014 1140   PLT 200 05/17/2023 0655   PLT 172 11/20/2014 1140   MCV 82.0 05/17/2023 0655   MCV 100 11/20/2014 1140   MCH 26.1 05/17/2023 0655   MCHC 31.8 05/17/2023 0655   RDW 16.4 (H) 05/17/2023 0655   RDW 15.0 (H) 11/20/2014 1140   LYMPHSABS 0.6 (L) 05/16/2023 1345   LYMPHSABS 1.0 11/20/2014 1140   MONOABS 1.0 05/16/2023 1345   MONOABS 0.5 11/20/2014 1140   EOSABS 0.3 05/16/2023 1345   EOSABS 0.0 11/20/2014 1140   BASOSABS 0.1 05/16/2023 1345   BASOSABS 0.1 11/20/2014 1140      Latest Ref Rng & Units 05/19/2023    6:45 AM 05/18/2023    4:44 AM 05/17/2023    6:55 AM  CMP  Glucose 70 - 99 mg/dL 161  096  045   BUN 8 - 23 mg/dL 20  18  19    Creatinine 0.61 - 1.24 mg/dL 4.09  8.11  9.14   Sodium 135 - 145 mmol/L 136  134  133   Potassium 3.5 - 5.1 mmol/L 3.9  3.1  3.9   Chloride 98 - 111 mmol/L 103  102  97   CO2 22 - 32 mmol/L 25  24  26    Calcium 8.9 - 10.3 mg/dL 8.4  8.7  8.8      Radiology Studies: No results found.   Scheduled Meds:  amLODipine  5 mg Oral Daily   diclofenac Sodium  2 g Topical QID   enoxaparin (LOVENOX) injection  40 mg Subcutaneous Q24H   famotidine  40 mg Per Tube Daily   feeding supplement (OSMOLITE 1.5 CAL)  1,530 mL Per Tube Q24H   feeding supplement (PROSource TF20)  60 mL Per Tube Daily   fludrocortisone  0.1 mg Per Tube Daily   free water  150 mL Per Tube 6 X Daily   hydrocortisone  10 mg Per Tube Q1400   hydrocortisone  15 mg Per Tube QAC breakfast   insulin aspart  0-6 Units Subcutaneous Q4H    levothyroxine  75 mcg Per Tube Daily   pantoprazole (PROTONIX) IV  40 mg Intravenous Q12H   sucralfate  1 g Per Tube Q6H   Continuous Infusions:     LOS: 1 day   Time spent: 35  Rhetta Mura, MD [facility] To contact the attending provider between 7A-7P or the covering provider during after hours 7P-7A, please log into the web site [email or web address] and access using universal [place] password for that web site. If you do not have the password, please call the hospital operator.  05/19/2023, 4:14 PM

## 2023-05-20 DIAGNOSIS — R627 Adult failure to thrive: Secondary | ICD-10-CM | POA: Diagnosis not present

## 2023-05-20 LAB — GLUCOSE, CAPILLARY
Glucose-Capillary: 177 mg/dL — ABNORMAL HIGH (ref 70–99)
Glucose-Capillary: 179 mg/dL — ABNORMAL HIGH (ref 70–99)
Glucose-Capillary: 129 mg/dL — ABNORMAL HIGH (ref 70–99)
Glucose-Capillary: 148 mg/dL — ABNORMAL HIGH (ref 70–99)
Glucose-Capillary: 212 mg/dL — ABNORMAL HIGH (ref 70–99)

## 2023-05-20 LAB — BASIC METABOLIC PANEL
Anion gap: 10 (ref 5–15)
BUN: 18 mg/dL (ref 8–23)
CO2: 24 mmol/L (ref 22–32)
Calcium: 8.5 mg/dL — ABNORMAL LOW (ref 8.9–10.3)
Chloride: 101 mmol/L (ref 98–111)
Creatinine, Ser: 0.85 mg/dL (ref 0.61–1.24)
GFR, Estimated: >60 mL/min (ref >60)
Glucose, Bld: 153 mg/dL — ABNORMAL HIGH (ref 70–99)
Potassium: 2.9 mmol/L — ABNORMAL LOW (ref 3.5–5.1)
Sodium: 135 mmol/L (ref 135–145)

## 2023-05-20 MED ORDER — POTASSIUM CHLORIDE 20 MEQ PO PACK
60.0000 meq | PACK | Freq: Two times a day (BID) | ORAL | Status: AC
  Administered 2023-05-20 – 2023-05-21 (×4): 60 meq via ORAL
  Filled 2023-05-20 (×4): qty 3

## 2023-05-20 MED ORDER — POTASSIUM CHLORIDE 20 MEQ PO PACK: 60.0000 meq | PACK | Freq: Two times a day (BID) | ORAL | Status: DC

## 2023-05-20 NOTE — TOC Progression Note (Signed)
Transition of Care [facility]) - Progression Note    Patient Details  Name: [name] MRN: [id number] Date of Birth: Jun 01, 1946  Transition of Care [facility]) CM/SW Contact  Starlette Thurow A [place], [place] Phone Number: 05/20/2023, 11:15 AM  Clinical Narrative:     CSW contacted pt's son regarding SNF selection. He said pt and family would be accepting offer from [facility].   Family requested private room for pt, [place] [facility] said private is not available until Monday.    Pt and family amenable to going to facility with shared room and moving to private room when available.   DC pending continued medical workup.   TOC will continue to follow.      Expected Discharge Plan: Skilled Nursing Facility Barriers to Discharge: Continued Medical Work up, SNF Pending bed offer  Expected Discharge Plan and Services In-house Referral: Clinical Social Work   Post Acute Care Choice: Skilled Nursing Facility Living arrangements for the past 2 months: Single Family Home                                       Social Determinants of Health (SDOH) Interventions SDOH Screenings   Food Insecurity: No Food Insecurity (12/28/2022)  Housing: Low Risk  (12/28/2022)  Transportation Needs: No Transportation Needs (12/28/2022)  Utilities: Not At Risk (12/28/2022)  Tobacco Use: Medium Risk (05/16/2023)    Readmission Risk Interventions    01/21/2023    9:13 AM  Readmission Risk Prevention Plan  Transportation Screening Complete  Medication Review (RN Care Manager) Complete  PCP or Specialist appointment within 3-5 days of discharge Complete  HRI or Home Care Consult Complete  SW Recovery Care/Counseling Consult Complete  Palliative Care Screening Not Applicable  Skilled Nursing Facility Complete

## 2023-05-20 NOTE — Progress Notes (Signed)
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 not on anticoagulation Recent hospitalization 5/8-5/10/2023 at [facility] 2/2 acute UGIB + blood in GJ tube hemoglobin baseline around 11-was at 9.4 there-Rx 1 unit PRBC-endoscopy deferred-GIB stopped 5/9 did not recur-Rx on DC PPI twice daily famotidine Carafate- - Hospitalization he had delirium which seems to be a common occurrence for him  Presented [facility] ED generalized weakness confusion incontinence CT head, CXR negative for anything acute Magnesium 1.5 BUNs/creatinine 26/1.1  Hospital-Problem based course  Delirium hospital-acquired Happens when he is hospitalized Seems intermittent --metabolic workup was negative --SH slightly elevated --- unclear how to interpret this in a hospitalized patient--nonetheless since it has been checked increase levothyroxine from 50-->75 daily Needs recheck TSH in 2 to 3 weeks Allegedly highly functional about 8 months ago able to do arithmetic and after hospitalization at [facility] he went home and seemed "normal" but then developed progressive confusion at home  Stroke 12/2022 Not a candidate really for ASA secondary to GI bleeding  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 Tube feeds nocturnal with Osmolite@goal  85 cc/H+ Prosource  Recurrent  esophagitis worsened by steroids Continue Carafate 1 g every 6 hourly, Protonix cannot be given crushed therefore changed back to IV Add back famotidine  Hypokalemia Give klor 60 bid per tube Check am mag again tomorrow  Elevated blood pressure Start amlodipine 5-titrate meds as an outpatient  Primary adrenal insufficiency on steroids/Florinef Continue home doses of 15 mg a.m., 10 mg at 2 PM Continue Florinef 0.1 daily Outpatient follow-up with endocrine/rheumatologist at [facility]  History DVT/PE Not on anticoagulation secondary to recurrent GI bleeds  Hyperglycemia Continue sliding scale coverage PTA metformin 500 daily held resume at discharge time  Seropositive rheumatoid arthritis follows at [facility] Abatacept as per outpatient physician Dr. Gaye Pollack Continue folic acid   DVT prophylaxis: Lovenox Code Status: Full CODE STATUS Family Communication: discussed with patient son, [name] Disposition:  Status is: Observation The patient will require care spanning > 2 midnights and should be moved to inpatient because:   Likely d/c in am if K improved to SNF  Subjective:  Mild confusion persists-he is quite reoriented well however and is pleasant  Objective: Vitals:   05/19/23 1655 05/19/23 1919 05/20/23 0347 05/20/23 1546  BP: (!) 153/79 (!) 158/79 (!) 150/95 (!) 155/77  Pulse: 75 76 70 75  Resp: 16 18 17 16   Temp: 98 F (36.7 C) 97.8 F (36.6 C) (!) 97.5 F (36.4 C) (!) 97.2 F (36.2 C)  TempSrc:  Oral Oral Oral  SpO2: 99% 98% 97% 99%  Weight:        Intake/Output Summary (Last 24 hours) at 05/20/2023 1613 Last data filed at 05/20/2023 0755 Gross per 24 hour  Intake --  Output 700 ml  Net -700 ml    Filed Weights   05/17/23 0500 05/18/23 0500  Weight: 73.9 kg 73.8 kg    Examination:  Alert coherent X1  Chest is clear no wheeze S1-S2 no murmur Abdomen soft feeding tube in place  No lower extremity edema  Data Reviewed: personally reviewed   CBC     Component Value Date/Time   WBC 6.4 05/17/2023 0655   RBC 4.06 (L) 05/17/2023 0655   HGB 10.6 (L) 05/17/2023 0655   HGB 13.7 11/20/2014 1140   HCT 33.3 (L) 05/17/2023 0655   HCT 41.8 11/20/2014 1140   PLT 200 05/17/2023 0655   PLT 172 11/20/2014 1140   MCV 82.0 05/17/2023 0655   MCV 100 11/20/2014 1140   MCH 26.1 05/17/2023 0655   MCHC 31.8 05/17/2023 0655   RDW 16.4 (H) 05/17/2023 0655   RDW 15.0 (H) 11/20/2014 1140   LYMPHSABS 0.6 (L) 05/16/2023 1345   LYMPHSABS 1.0 11/20/2014 1140   MONOABS 1.0 05/16/2023 1345   MONOABS 0.5 11/20/2014 1140   EOSABS 0.3 05/16/2023 1345   EOSABS 0.0 11/20/2014 1140   BASOSABS 0.1 05/16/2023 1345   BASOSABS 0.1 11/20/2014 1140      Latest Ref Rng & Units 05/20/2023    6:13 AM 05/19/2023    6:45 AM 05/18/2023    4:44 AM  CMP  Glucose 70 - 99 mg/dL 161  096  045   BUN 8 - 23 mg/dL 18  20  18    Creatinine 0.61 - 1.24 mg/dL 4.09  8.11  9.14   Sodium 135 - 145 mmol/L 135  136  134   Potassium 3.5 - 5.1 mmol/L 2.9  3.9  3.1   Chloride 98 - 111 mmol/L 101  103  102   CO2 22 - 32 mmol/L 24  25  24    Calcium 8.9 - 10.3 mg/dL 8.5  8.4  8.7    Radiology Studies: No results found.  Scheduled Meds:  amLODipine  5 mg Oral Daily   diclofenac Sodium  2 g Topical QID   enoxaparin (LOVENOX) injection  40 mg Subcutaneous Q24H   famotidine  40 mg Per Tube Daily   feeding supplement (OSMOLITE 1.5 CAL)  1,530 mL Per Tube Q24H   feeding supplement (PROSource TF20)  60 mL Per Tube Daily   fludrocortisone  0.1 mg Per Tube Daily   free water  200 mL Per Tube 6 X Daily   hydrocortisone  10 mg Per Tube Q1400   hydrocortisone  15 mg Per Tube QAC breakfast   insulin aspart  0-6 Units Subcutaneous Q4H   levothyroxine  75 mcg Per Tube Daily   pantoprazole (PROTONIX) IV  40 mg Intravenous Q12H   potassium chloride  60 mEq Oral BID   sucralfate  1 g Per Tube Q6H   Continuous Infusions:    LOS: 2 days   Time spent: 41  Rhetta Mura, MD [facility] To contact the attending provider between 7A-7P or the covering provider during after hours 7P-7A, please log into the web site [email or web address] and access using universal [place] password for that web site. If you do not have the password, please call the hospital operator.  05/20/2023, 4:13 PM

## 2023-05-20 NOTE — Care Management Important Message (Signed)
Important Message  Patient Details  Name: [name] MRN: [id number] Date of Birth: June 08, 1946   Medicare Important Message Given:  Yes     Azriel Dancy Stefan Church 05/20/2023, 2:50 PM

## 2023-05-20 NOTE — Progress Notes (Signed)
Physical Therapy Treatment Patient Details Name: [name] MRN: [id number] DOB: 09-Aug-1946 Today's Date: 05/20/2023   History of Present Illness 77 yo male presents to [facility] on 5/20 with AMS, weakness, incontinence. 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    PT Comments    Pt with periods of irritability during session, suspect due to confusion as pt is oriented to self only. Pt also expressing paranoid thoughts, stating his family "tricked" him to get him to the hospital. Pt mobilizing in hallway with use of RW, requires light assist to close guard for safety but pt is resistant to hands-on assist stating if he falls "I will just fall back". Pt progressing slowly but well, will continue to follow.     Recommendations for follow up therapy are one component of a multi-disciplinary discharge planning process, led by the attending physician.  Recommendations may be updated based on patient status, additional functional criteria and insurance authorization.  Follow Up Recommendations       Assistance Recommended at Discharge Frequent or constant Supervision/Assistance  Patient can return home with the following A little help with walking and/or transfers;A little help with bathing/dressing/bathroom;Direct supervision/assist for medications management;Direct supervision/assist for financial management;Help with stairs or ramp for entrance;Assist for transportation   Equipment Recommendations  None recommended by PT    Recommendations for Other Services       Precautions / Restrictions Precautions Precautions: Fall Restrictions Weight Bearing Restrictions: No     Mobility  Bed Mobility Overal bed mobility: Needs Assistance Bed Mobility: Supine to  Sit, Sit to Supine     Supine to sit: Supervision, HOB elevated Sit to supine: Min assist, HOB elevated        Transfers Overall transfer level: Needs assistance Equipment used: Rolling walker (2 wheels) Transfers: Sit to/from Stand Sit to Stand: Min assist           General transfer comment: light rise and steady assist, periods of close guard only during repeated sit<>stands.    Ambulation/Gait Ambulation/Gait assistance: Min guard Gait Distance (Feet): 90 Feet (x2 - seated rest break) Assistive device: IV Pole, Rolling walker (2 wheels) Gait Pattern/deviations: Step-through pattern, Decreased stride length, Trunk flexed, Drifts right/left Gait velocity: decr     General Gait Details: cues for upright posture, placement in RW, hallway navigation. Pt does not like PT guarding him   Stairs             Wheelchair Mobility    Modified Rankin (Stroke Patients Only)       Balance Overall balance assessment: Needs assistance, History of Falls Sitting-balance support: No upper extremity supported, Feet supported Sitting balance-Leahy Scale: Fair     Standing balance support: Single extremity supported, During functional activity Standing balance-Leahy Scale: Poor                              Cognition Arousal/Alertness: Awake/alert Behavior During Therapy: Flat affect Overall Cognitive Status: Impaired/Different from baseline Area of Impairment: Orientation, Attention, Following commands, Safety/judgement, Problem solving                 Orientation Level: Disoriented to, Place, Time, Situation Current Attention Level: Sustained   Following Commands: Follows one step commands with increased time Safety/Judgement: Decreased awareness of deficits,  Decreased awareness of safety   Problem Solving: Slow processing, Requires verbal cues, Requires tactile cues, Decreased initiation, Difficulty sequencing General Comments: pt states he is in  his ex-wife's home, and then when PT told her that was wrong he said "well I am here". Periods of paranoid statements, stating his son "tricked" him into coming to the hospital and everyone made up things about him to get him here. periods of irritability, follows my periods of pleasantness.        Exercises General Exercises - Lower Extremity Long Arc Quad: AROM, Both, 15 reps, Seated Hip Flexion/Marching: AROM, Both, 15 reps, Seated Mini-Sqauts: AROM, Both, 10 reps (sit to stands x10, use of UEs to power up)    General Comments        Pertinent Vitals/Pain Pain Assessment Pain Assessment: Faces Faces Pain Scale: No hurt Pain Intervention(s): Monitored during session    Home Living                          Prior Function            PT Goals (current goals can now be found in the care plan section) Acute Rehab PT Goals Patient Stated Goal: SNF d/c PT Goal Formulation: With patient/family Time For Goal Achievement: 05/31/23 Potential to Achieve Goals: Good Progress towards PT goals: Progressing toward goals    Frequency    Min 2X/week      PT Plan Current plan remains appropriate    Co-evaluation              AM-PAC PT "6 Clicks" Mobility   Outcome Measure  Help needed turning from your back to your side while in a flat bed without using bedrails?: A Little Help needed moving from lying on your back to sitting on the side of a flat bed without using bedrails?: A Little Help needed moving to and from a bed to a chair (including a wheelchair)?: A Little Help needed standing up from a chair using your arms (e.g., wheelchair or bedside chair)?: A Little Help needed to walk in hospital room?: A Little Help needed climbing 3-5 steps with a railing? : A Lot 6 Click Score: 17    End of Session   Activity Tolerance: Patient tolerated treatment well Patient left: with call bell/phone within reach;in bed;with bed alarm set Nurse Communication:  Mobility status PT Visit Diagnosis: Other abnormalities of gait and mobility (R26.89);Muscle weakness (generalized) (M62.81)     Time: 9629-5284 PT Time Calculation (min) (ACUTE ONLY): 15 min  Charges:  $Therapeutic Activity: 8-22 mins                     Marye Round, PT DPT Acute Rehabilitation Services Secure Chat Preferred  Office [phone]    Jackie Russman Sheliah Plane 05/20/2023, 4:15 PM

## 2023-05-21 DIAGNOSIS — R627 Adult failure to thrive: Secondary | ICD-10-CM | POA: Diagnosis not present

## 2023-05-21 LAB — GLUCOSE, CAPILLARY
Glucose-Capillary: 146 mg/dL — ABNORMAL HIGH (ref 70–99)
Glucose-Capillary: 158 mg/dL — ABNORMAL HIGH (ref 70–99)
Glucose-Capillary: 159 mg/dL — ABNORMAL HIGH (ref 70–99)
Glucose-Capillary: 169 mg/dL — ABNORMAL HIGH (ref 70–99)
Glucose-Capillary: 174 mg/dL — ABNORMAL HIGH (ref 70–99)
Glucose-Capillary: 195 mg/dL — ABNORMAL HIGH (ref 70–99)

## 2023-05-21 LAB — CBC
HCT: 32 % — ABNORMAL LOW (ref 39.0–52.0)
Hemoglobin: 10.2 g/dL — ABNORMAL LOW (ref 13.0–17.0)
MCH: 26.2 pg (ref 26.0–34.0)
MCHC: 31.9 g/dL (ref 30.0–36.0)
MCV: 82.1 fL (ref 80.0–100.0)
Platelets: 192 10*3/uL (ref 150–400)
RBC: 3.9 MIL/uL — ABNORMAL LOW (ref 4.22–5.81)
RDW: 16.6 % — ABNORMAL HIGH (ref 11.5–15.5)
WBC: 6 10*3/uL (ref 4.0–10.5)
nRBC: 0 % (ref 0.0–0.2)

## 2023-05-21 LAB — BASIC METABOLIC PANEL
Anion gap: 9 (ref 5–15)
BUN: 23 mg/dL (ref 8–23)
CO2: 24 mmol/L (ref 22–32)
Calcium: 8.7 mg/dL — ABNORMAL LOW (ref 8.9–10.3)
Chloride: 104 mmol/L (ref 98–111)
Creatinine, Ser: 0.84 mg/dL (ref 0.61–1.24)
GFR, Estimated: >60 mL/min (ref >60)
Glucose, Bld: 207 mg/dL — ABNORMAL HIGH (ref 70–99)
Potassium: 3.6 mmol/L (ref 3.5–5.1)
Sodium: 137 mmol/L (ref 135–145)

## 2023-05-21 NOTE — Progress Notes (Signed)
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 not on anticoagulation Recent hospitalization 5/8-5/10/2023 at [facility] 2/2 acute UGIB + blood in GJ tube hemoglobin baseline around 11-was at 9.4 there-Rx 1 unit PRBC-endoscopy deferred-GIB stopped 5/9 did not recur-Rx on DC PPI twice daily famotidine Carafate- - Hospitalization he had delirium which seems to be a common occurrence for him  Presented [facility] ED generalized weakness confusion incontinence CT head, CXR negative for anything acute Magnesium 1.5 BUNs/creatinine 26/1.1  Hospital-Problem based course  Delirium hospital-acquired Happens when he is hospitalized Seems intermittent --metabolic workup was negative --TSH slightly elevated --- unclear how to interpret this in a hospitalized patient--nonetheless since it has been checked increase levothyroxine from 50-->75 daily--- recheck TSH in 2 to 3 weeks Allegedly highly functional about 8 months ago --capable of arithmetic and after hospitalization at [facility] he went home and seemed "normal" but then developed progressive confusion at home  Stroke 12/2022 Not a candidate really for ASA secondary to GI bleeding  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 Tube feeds nocturnal with Osmolite@goal  85 cc/H+ Prosource  Recurrent  esophagitis worsened by steroids Continue Carafate 1 g every 6 hourly, Protonix cannot be given crushed therefore changed back to IV--- as an outpatient switch to alternative PPI--continue famotidine  Hypokalemia Give klor 60 bid per tube--potassium is replaced so no need to check further magnesium  Elevated blood pressure Start amlodipine 5-titrate meds as an outpatient  Primary adrenal insufficiency on steroids/Florinef Continue home doses of 15 mg a.m., 10 mg at 2 PM Continue Florinef 0.1 daily Outpatient follow-up with endocrine/rheumatologist at [facility]  History DVT/PE Not on anticoagulation secondary to recurrent GI bleeds  Hyperglycemia Continue sliding scale coverage PTA metformin 500 daily held resume at discharge time  Seropositive rheumatoid arthritis follows at [facility] Abatacept as per outpatient physician Dr. Gaye Pollack Continue folic acid   DVT prophylaxis: Lovenox Code Status: Full CODE STATUS Family Communication: discussed with patient son, [name] Disposition:  Status is: Observation The patient will require care spanning > 2 midnights and should be moved to inpatient because:   Electrolytes have normalized await [facility]   Subjective:  Admits that he is bored-no chest pain-no nausea eating and drinking Some hand pain  Objective: Vitals:   05/21/23 0001 05/21/23 0414 05/21/23 0930 05/21/23 1652  BP: 135/62 (!) 144/68 139/63 128/77  Pulse: 67 70 69 74  Resp: 16 16 16 16   Temp: 97.8 F (36.6 C) 98.8 F (37.1 C) 98.1 F (36.7 C) 98.3 F (36.8 C)  TempSrc: Oral  Oral Oral  SpO2: 99% 97% 98% 100%  Weight:        Intake/Output Summary (Last 24 hours) at 05/21/2023 1658 Last data filed at 05/21/2023 0931 Gross per 24 hour  Intake --  Output 600 ml  Net -600 ml    Filed Weights   05/17/23 0500 05/18/23 0500  Weight: 73.9 kg 73.8 kg  Examination:  Alert coherent X1  Chest is clear no wheeze S1-S2 no murmur Patient has significant  deformities of metatarsophalangeal joints with the lining consistent with severe arthritis Abdomen soft feeding tube in place  No lower extremity edema Did not examine sacrum  Data Reviewed: personally reviewed   CBC    Component Value Date/Time   WBC 6.0 05/21/2023 0450   RBC 3.90 (L) 05/21/2023 0450   HGB 10.2 (L) 05/21/2023 0450   HGB 13.7 11/20/2014 1140   HCT 32.0 (L) 05/21/2023 0450   HCT 41.8 11/20/2014 1140   PLT 192 05/21/2023 0450   PLT 172 11/20/2014 1140   MCV 82.1 05/21/2023 0450   MCV 100 11/20/2014 1140   MCH 26.2 05/21/2023 0450   MCHC 31.9 05/21/2023 0450   RDW 16.6 (H) 05/21/2023 0450   RDW 15.0 (H) 11/20/2014 1140   LYMPHSABS 0.6 (L) 05/16/2023 1345   LYMPHSABS 1.0 11/20/2014 1140   MONOABS 1.0 05/16/2023 1345   MONOABS 0.5 11/20/2014 1140   EOSABS 0.3 05/16/2023 1345   EOSABS 0.0 11/20/2014 1140   BASOSABS 0.1 05/16/2023 1345   BASOSABS 0.1 11/20/2014 1140      Latest Ref Rng & Units 05/21/2023    4:50 AM 05/20/2023    6:13 AM 05/19/2023    6:45 AM  CMP  Glucose 70 - 99 mg/dL 578  469  629   BUN 8 - 23 mg/dL 23  18  20    Creatinine 0.61 - 1.24 mg/dL 5.28  4.13  2.44   Sodium 135 - 145 mmol/L 137  135  136   Potassium 3.5 - 5.1 mmol/L 3.6  2.9  3.9   Chloride 98 - 111 mmol/L 104  101  103   CO2 22 - 32 mmol/L 24  24  25    Calcium 8.9 - 10.3 mg/dL 8.7  8.5  8.4    Radiology Studies: No results found.  Scheduled Meds:  amLODipine  5 mg Oral Daily   diclofenac Sodium  2 g Topical QID   enoxaparin (LOVENOX) injection  40 mg Subcutaneous Q24H   famotidine  40 mg Per Tube Daily   feeding supplement (OSMOLITE 1.5 CAL)  1,530 mL Per Tube Q24H   feeding supplement (PROSource TF20)  60 mL Per Tube Daily   fludrocortisone  0.1 mg Per Tube Daily   free water  200 mL Per Tube 6 X Daily   hydrocortisone  10 mg Per Tube Q1400   hydrocortisone  15 mg Per Tube QAC breakfast   insulin aspart  0-6 Units Subcutaneous Q4H   levothyroxine  75 mcg Per Tube  Daily   pantoprazole (PROTONIX) IV  40 mg Intravenous Q12H   potassium chloride  60 mEq Oral BID   sucralfate  1 g Per Tube Q6H   Continuous Infusions:    LOS: 3 days   Time spent: 27  Rhetta Mura, MD [facility] To contact the attending provider between 7A-7P or the covering provider during after hours 7P-7A, please log into the web site [email or web address] and access using universal [place] password for that web site. If you do not have the password, please call the hospital operator.  05/21/2023, 4:58 PM

## 2023-05-22 DIAGNOSIS — R627 Adult failure to thrive: Secondary | ICD-10-CM | POA: Diagnosis not present

## 2023-05-22 LAB — GLUCOSE, CAPILLARY
Glucose-Capillary: 109 mg/dL — ABNORMAL HIGH (ref 70–99)
Glucose-Capillary: 122 mg/dL — ABNORMAL HIGH (ref 70–99)
Glucose-Capillary: 144 mg/dL — ABNORMAL HIGH (ref 70–99)
Glucose-Capillary: 148 mg/dL — ABNORMAL HIGH (ref 70–99)
Glucose-Capillary: 171 mg/dL — ABNORMAL HIGH (ref 70–99)
Glucose-Capillary: 175 mg/dL — ABNORMAL HIGH (ref 70–99)
Glucose-Capillary: 200 mg/dL — ABNORMAL HIGH (ref 70–99)

## 2023-05-22 LAB — CBC
HCT: 32.6 % — ABNORMAL LOW (ref 39.0–52.0)
Hemoglobin: 10.5 g/dL — ABNORMAL LOW (ref 13.0–17.0)
MCH: 26.7 pg (ref 26.0–34.0)
MCHC: 32.2 g/dL (ref 30.0–36.0)
MCV: 83 fL (ref 80.0–100.0)
Platelets: 194 10*3/uL (ref 150–400)
RBC: 3.93 MIL/uL — ABNORMAL LOW (ref 4.22–5.81)
RDW: 17 % — ABNORMAL HIGH (ref 11.5–15.5)
WBC: 6.7 10*3/uL (ref 4.0–10.5)
nRBC: 0 % (ref 0.0–0.2)

## 2023-05-22 MED ORDER — METFORMIN HCL 500 MG PO TABS
500.0000 mg | ORAL_TABLET | Freq: Every day | ORAL | Status: DC
  Administered 2023-05-22: 500 mg
  Filled 2023-05-22: qty 1

## 2023-05-22 NOTE — Plan of Care (Signed)

## 2023-05-22 NOTE — Progress Notes (Signed)
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 not on anticoagulation Recent hospitalization 5/8-5/10/2023 at [name] 2/2 acute UGIB + blood in GJ tube hemoglobin baseline around 11-was at 9.4 there-Rx 1 unit PRBC-endoscopy deferred-GIB stopped 5/9 did not recur-Rx on DC PPI twice daily famotidine Carafate- - Hospitalization he had delirium which seems to be a common occurrence for him  Presented [facility] ED generalized weakness confusion incontinence CT head, CXR negative for anything acute Magnesium 1.5 BUNs/creatinine 26/1.1  Hospital-Problem based course  Delirium hospital-acquired Happens when he is hospitalized Seems intermittent --metabolic workup was negative --TSH slightly elevated ---increase levothyroxine from 50-->75 daily--- recheck TSH in 2 to 3 weeks  Stroke 12/2022 Not a candidate really for ASA secondary to GI bleeding  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  Elevated blood pressure Start amlodipine 5-titrate meds as an outpatient  Primary adrenal insufficiency on  steroids/Florinef Continue home doses of 15 mg a.m., 10 mg at 2 PM Continue Florinef 0.1 daily Outpatient follow-up with endocrine/rheumatologist at [facility]  History DVT/PE Not on anticoagulation secondary to recurrent GI bleeds  Hyperglycemia Continue sliding scale coverage-CBG 100-1 75 Resume metformin 500 daily in a.m.  Seropositive rheumatoid arthritis follows at [facility] Abatacept as per outpatient physician Dr. Gaye Pollack Continue folic acid   DVT prophylaxis: Lovenox Code Status: Full CODE STATUS Family Communication: No family present at the bedside for several days-I called him on 5/26 Disposition:  Status is: Observation The patient will require care spanning > 2 midnights and should be moved to inpatient because:   Await placement at [facility] 5/27 when tube feeds can be managed by the facility appropriately  Subjective:  No new complaints  Objective: Vitals:   05/21/23 1652 05/21/23 1950 05/22/23 0548 05/22/23 0904  BP: 128/77 134/70 (!) 144/68 138/76  Pulse: 74 72 67 66  Resp: 16 16 16 18   Temp: 98.3 F (36.8 C) 98.6 F (37 C) 97.9 F (36.6 C) (!) 97.4 F (36.3 C)  TempSrc: Oral Oral Oral Oral  SpO2: 100% 99% 98% 100%  Weight:        Intake/Output Summary (Last 24 hours) at 05/22/2023 1601 Last data filed at 05/22/2023 1131 Gross per 24 hour  Intake --  Output 650 ml  Net -650 ml    Filed Weights   05/17/23 0500 05/18/23 0500  Weight: 73.9 kg 73.8 kg    Examination:  Alert coherent X1  Chest is clear no wheeze S1-S2 no murmur Patient has significant deformities of metatarsophalangeal joints with the lining consistent with severe arthritis Abdomen soft feeding tube in place  No lower extremity edema Did not examine sacrum  Data Reviewed: personally reviewed   CBC    Component Value Date/Time  WBC 6.7 05/22/2023 0638   RBC 3.93 (L) 05/22/2023 0638   HGB 10.5 (L) 05/22/2023 0638   HGB 13.7 11/20/2014 1140   HCT 32.6 (L) 05/22/2023 0638    HCT 41.8 11/20/2014 1140   PLT 194 05/22/2023 0638   PLT 172 11/20/2014 1140   MCV 83.0 05/22/2023 0638   MCV 100 11/20/2014 1140   MCH 26.7 05/22/2023 0638   MCHC 32.2 05/22/2023 0638   RDW 17.0 (H) 05/22/2023 0638   RDW 15.0 (H) 11/20/2014 1140   LYMPHSABS 0.6 (L) 05/16/2023 1345   LYMPHSABS 1.0 11/20/2014 1140   MONOABS 1.0 05/16/2023 1345   MONOABS 0.5 11/20/2014 1140   EOSABS 0.3 05/16/2023 1345   EOSABS 0.0 11/20/2014 1140   BASOSABS 0.1 05/16/2023 1345   BASOSABS 0.1 11/20/2014 1140      Latest Ref Rng & Units 05/21/2023    4:50 AM 05/20/2023    6:13 AM 05/19/2023    6:45 AM  CMP  Glucose 70 - 99 mg/dL 161  096  045   BUN 8 - 23 mg/dL 23  18  20    Creatinine 0.61 - 1.24 mg/dL 4.09  8.11  9.14   Sodium 135 - 145 mmol/L 137  135  136   Potassium 3.5 - 5.1 mmol/L 3.6  2.9  3.9   Chloride 98 - 111 mmol/L 104  101  103   CO2 22 - 32 mmol/L 24  24  25    Calcium 8.9 - 10.3 mg/dL 8.7  8.5  8.4    Radiology Studies: No results found.  Scheduled Meds:  amLODipine  5 mg Oral Daily   diclofenac Sodium  2 g Topical QID   enoxaparin (LOVENOX) injection  40 mg Subcutaneous Q24H   famotidine  40 mg Per Tube Daily   feeding supplement (OSMOLITE 1.5 CAL)  1,530 mL Per Tube Q24H   feeding supplement (PROSource TF20)  60 mL Per Tube Daily   fludrocortisone  0.1 mg Per Tube Daily   free water  200 mL Per Tube 6 X Daily   hydrocortisone  10 mg Per Tube Q1400   hydrocortisone  15 mg Per Tube QAC breakfast   insulin aspart  0-6 Units Subcutaneous Q4H   levothyroxine  75 mcg Per Tube Daily   pantoprazole (PROTONIX) IV  40 mg Intravenous Q12H   sucralfate  1 g Per Tube Q6H   Continuous Infusions:    LOS: 4 days   Time spent: 58  Rhetta Mura, MD [facility] To contact the attending provider between 7A-7P or the covering provider during after hours 7P-7A, please log into the web site [email or web address] and access using universal [place] password for that web site.  If you do not have the password, please call the hospital operator.  05/22/2023, 4:01 PM

## 2023-05-22 NOTE — Progress Notes (Signed)
   Medical records reviewed. Patient continues to await SNF placement at [facility] with outpatient palliative care follow up. Goals of care are clear at this time.  PMT will continue to follow peripherally. Patient and family have been encouraged to reach out with additional needs or concerns.  Thank you for your referral and allowing PMT to assist in [name]'s care.   Richardson Dopp, [facility] Palliative Medicine Team  Team Phone # [phone]   NO CHARGE

## 2023-05-23 DIAGNOSIS — R627 Adult failure to thrive: Secondary | ICD-10-CM | POA: Diagnosis not present

## 2023-05-23 LAB — BASIC METABOLIC PANEL
Anion gap: 10 (ref 5–15)
BUN: 27 mg/dL — ABNORMAL HIGH (ref 8–23)
CO2: 26 mmol/L (ref 22–32)
Calcium: 8.5 mg/dL — ABNORMAL LOW (ref 8.9–10.3)
Chloride: 99 mmol/L (ref 98–111)
Creatinine, Ser: 0.88 mg/dL (ref 0.61–1.24)
GFR, Estimated: >60 mL/min (ref >60)
Glucose, Bld: 188 mg/dL — ABNORMAL HIGH (ref 70–99)
Potassium: 3.5 mmol/L (ref 3.5–5.1)
Sodium: 135 mmol/L (ref 135–145)

## 2023-05-23 LAB — GLUCOSE, CAPILLARY
Glucose-Capillary: 118 mg/dL — ABNORMAL HIGH (ref 70–99)
Glucose-Capillary: 124 mg/dL — ABNORMAL HIGH (ref 70–99)
Glucose-Capillary: 131 mg/dL — ABNORMAL HIGH (ref 70–99)
Glucose-Capillary: 194 mg/dL — ABNORMAL HIGH (ref 70–99)

## 2023-05-23 MED ORDER — OMEPRAZOLE MAGNESIUM 20 MG PO TBEC: 40.0000 mg | DELAYED_RELEASE_TABLET | Freq: Every day | ORAL | Status: DC

## 2023-05-23 MED ORDER — FAMOTIDINE 40 MG PO TABS: 40.0000 mg | ORAL_TABLET | Freq: Every day | ORAL | Status: DC

## 2023-05-23 MED ORDER — FREE WATER: 200.0000 mL | Freq: Every day | Status: DC

## 2023-05-23 MED ORDER — AMLODIPINE BESYLATE 5 MG PO TABS: 5.0000 mg | ORAL_TABLET | Freq: Every day | ORAL | Status: DC

## 2023-05-23 MED ORDER — OXYCODONE HCL 5 MG PO TABS: 5.0000 mg | ORAL_TABLET | Freq: Four times a day (QID) | ORAL | 0 refills | Status: AC | PRN

## 2023-05-23 MED ORDER — PROSOURCE TF20 ENFIT COMPATIBL EN LIQD: 60.0000 mL | Freq: Every day | ENTERAL | Status: DC

## 2023-05-23 NOTE — Discharge Summary (Signed)
Physician Discharge Summary  [name] BJY:[id number] DOB: 07/30/1946 DOA: 05/16/2023  PCP: Garry Heater, MD  Admit date: 05/16/2023 Discharge date: 05/23/2023  Time spent: 44 minutes  Recommendations for Outpatient Follow-up:  Needs outpatient CBC, Chem-7 in 1 week Patient was encephalopathic unclear if this was secondary to mild hypothyroidism-it was debated whether to increase his Synthroid and we did so during hospital stay because there was no other source of encephalopathy and would strongly recommend close follow-up with either endocrinology as an outpatient and or rheumatologist who prescribes the patient's Cortef and Florinef-all of these meds affect the HPA pit axis and given his improvement over hospital stay on increased dose of Synthroid would carefully adjust these meds under the guidance of an endocrinologist-seeing that the patient is at [facility] for care under the care of his rheumatologist, it stands to reason he can see someone there as well for endocrinology management Recommend outpatient GI follow-up with regards to further management of recent acute GI bleed Outpatient follow-up with rheumatologist Dr. Gaye Pollack for abatacept and titration of Cortef/Florinef--- patient needs to be on PPI long-term and will need adjustment of methotrexate dosing given drug interaction between PPI and methotrexate Protonix substituted given on discharge Prilosec because Protonix cannot be crushed and placed in tube Recommend outpatient goals of care-patient has mild dementia but has several illnesses including underlying diagnosis of squamous cell CA of the tongue and with his worsening delirium unless there is a cause that is found it is anticipated that he will decline eventually  Discharge Diagnoses:  MAIN problem for hospitalization   Incontinence confusion and generalized weakness without cause secondary to debility Hospital-acquired delirium History of S CC tongue esophagitis  stenosis dilatations entirely PEG tube dependent Hypokalemia Hypertension Primary adrenal insufficiency on steroids Prior DVT PE not on anticoagulation Hyperglycemia Seropositive rheumatoid arthritis follows at [facility] Dr. 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 not on anticoagulation Recent hospitalization 5/8-5/10/2023 at [facility] 2/2 acute UGIB + blood in GJ tube hemoglobin baseline around 11-was at 9.4 there-Rx 1 unit PRBC-endoscopy deferred-GIB stopped 5/9 did not recur-Rx on DC PPI twice daily famotidine Carafate- - Hospitalization he had delirium which seems to be a common occurrence for him    Hospital Course:  Delirium hospital-acquired Happens when he is hospitalized Seems intermittent --metabolic workup was negative --TSH slightly elevated ---increase levothyroxine from 50-->75 daily--- recheck TSH in 2 to 3 weeks-please refer as an outpatient to [facility] endocrinology for further workup   Stroke 12/2022 Not a candidate really for ASA secondary to GI bleeding   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 Tube feeds nocturnal with Osmolite@goal   85 cc/H+ Prosource We increased his free water to 200 and he will continue those  feeds at nursing facility   Recurrent esophagitis worsened by steroids Continue Carafate 1 g every 6 hourly-protonix be crushed and placed in the tube-substitution of Prilosec on discharge-continuing on discharge-- famotidine-no vomit-does not eat typically PEG tube dependent as above   Hypokalemia Potassium replaced during hospital stay   Elevated blood pressure with uncontrolled symptoms Start amlodipine 5-titrate meds as an outpatient   Primary adrenal insufficiency on steroids/Florinef Continue home doses of 15 mg a.m., 10 mg at 2 PM Continue Florinef 0.1 daily Outpatient follow-up with endocrine/rheumatologist at [facility]   History DVT/PE Not on anticoagulation secondary to recurrent GI bleeds   Hyperglycemia Was on the scale coverage during hospitalization sugars were predominantly below 200 We resumed metformin at discharge   Seropositive rheumatoid arthritis follows at [facility] Abatacept as per outpatient physician Dr. Gaye Pollack Continue folic acid    Discharge Exam: Vitals:   05/23/23 0442 05/23/23 0744  BP: 123/70 122/64  Pulse: 67 70  Resp: 15 18  Temp: 98.1 F (36.7 C) 97.6 F (36.4 C)  SpO2: 100% 99%    Subj on day of d/c   Awake coherent no distress-seems about the same "I feel bored"  General Exam on discharge  Looks younger than stated age No icterus no pallor Chest is clear no wheeze PEG tube in place left lower quadrant No lower extremity edema ROM intact obeys commands Congruent although slightly confused still which may be his baseline  Discharge Instructions   Discharge Instructions     Diet - low sodium heart healthy   Complete by: As directed    Increase activity slowly   Complete by: As directed       Allergies as of 05/23/2023   No Known Allergies      Medication List     STOP taking these medications    pantoprazole sodium 40 mg Commonly  known as: PROTONIX       TAKE these medications    abatacept 250 MG injection Commonly known as: ORENCIA Inject 250 mg into the vein every 30 (thirty) days. What changed: how much to take   amLODipine 5 MG tablet Commonly known as: NORVASC Take 1 tablet (5 mg total) by mouth daily.   atorvastatin 40 MG tablet Commonly known as: LIPITOR Place 1 tablet (40 mg total) into feeding tube daily.   diclofenac Sodium 1 % Gel Commonly known as: VOLTAREN Apply 2 g topically 4 (four) times daily.   famotidine 40 MG tablet Commonly known as: PEPCID Place 1 tablet (40 mg total) into feeding tube daily.   feeding supplement (OSMOLITE 1.5 CAL) Liqd Place 1,530 mLs into feeding tube daily. Run for 18 hours from 1600 to 1000   feeding supplement (PROSource TF20) liquid Place 60 mLs into feeding tube daily.   Feeding Supplies Kit 1 Box by Does not apply route daily. Patient will need pump and supplies for continuous tube feeds and 60mL syringes for free water flushes   ferrous sulfate 300 (60 Fe) MG/5ML syrup Place 5 mLs (300 mg total) into feeding tube daily.   fludrocortisone 0.1 MG tablet Commonly known as: FLORINEF Place 1 tablet (0.1 mg total) into feeding tube daily.   fluticasone 50 MCG/ACT nasal spray Commonly known as: FLONASE Place 1 spray into both nostrils daily.   folic acid 1 MG tablet Commonly known as: FOLVITE Place 1 tablet (1 mg total) into feeding tube daily.   free water Soln Place 200 mLs into feeding tube 6 (six) times daily. What changed: how  much to take   hydrocortisone 10 MG tablet Commonly known as: CORTEF Place 1 tablet (10 mg total) into feeding tube daily at 2 PM.   hydrocortisone 5 MG tablet Commonly known as: CORTEF Place 3 tablets (15 mg total) into feeding tube daily before breakfast.   levothyroxine 50 MCG tablet Commonly known as: SYNTHROID Place 1 tablet (50 mcg total) into feeding tube daily.   loratadine 10 MG tablet Commonly  known as: CLARITIN Take 10 mg by mouth daily.   magnesium oxide 400 (240 Mg) MG tablet Commonly known as: MAG-OX Place 1 tablet (400 mg total) into feeding tube daily.   metFORMIN 500 MG tablet Commonly known as: GLUCOPHAGE Place 1 tablet (500 mg total) into feeding tube daily.   methotrexate 50 MG/2ML injection Inject 15 mg into the skin once a week.   omeprazole 20 MG tablet Commonly known as: PriLOSEC OTC Take 2 tablets (40 mg total) by mouth daily.   oxyCODONE 5 MG immediate release tablet Commonly known as: Oxy IR/ROXICODONE Take 1 tablet (5 mg total) by mouth every 6 (six) hours as needed for up to 3 days for severe pain. What changed: when to take this   sucralfate 1 GM/10ML suspension Commonly known as: CARAFATE Place 10 mLs (1 g total) into feeding tube every 6 (six) hours.   TYLENOL ARTHRITIS PAIN PO Take 325 mg by mouth as needed (As needed).       No Known Allergies    The results of significant diagnostics from this hospitalization (including imaging, microbiology, ancillary and laboratory) are listed below for reference.    Significant Diagnostic Studies: CT HEAD WO CONTRAST  Result Date: 05/16/2023 CLINICAL DATA:  Mental status change of unknown cause. EXAM: CT HEAD WITHOUT CONTRAST TECHNIQUE: Contiguous axial images were obtained from the base of the skull through the vertex without intravenous contrast. RADIATION DOSE REDUCTION: This exam was performed according to the departmental dose-optimization program which includes automated exposure control, adjustment of the mA and/or kV according to patient size and/or use of iterative reconstruction technique. COMPARISON:  12/31/2022 FINDINGS: Brain: There is central cortical atrophy. Periventricular white matter changes are consistent with small vessel disease. There is no intra or extra-axial fluid collection or mass lesion. The basilar cisterns and ventricles have a normal appearance. There is no CT evidence  for acute infarction or hemorrhage. Vascular: There is atherosclerotic calcification of the internal carotid arteries. No hyperdense vessels. Skull: Normal. Negative for fracture or focal lesion. Sinuses/Orbits: There is increased significant opacity involving the LEFT frontal, LEFT ethmoid, LEFT sphenoid, and LEFT maxillary sinus. Air-fluid levels are present. Findings are consistent with increased, acute sinusitis, likely superimposed on chronic changes. Small LEFT mastoid effusion. Other: None. IMPRESSION: 1. Atrophy and small vessel disease. 2. No evidence for acute intracranial abnormality. 3. Increased acute sinusitis, likely superimposed on chronic changes. Electronically Signed   By: Norva Pavlov M.D.   On: 05/16/2023 13:23   DG Chest 1 View  Result Date: 05/16/2023 CLINICAL DATA:  Altered mental status.  Shortness of breath. EXAM: CHEST  1 VIEW COMPARISON:  01/15/2023 FINDINGS: Heart size normal. Stable elevation of the RIGHT hemidiaphragm. Lungs are clear. Slight rotation towards the RIGHT. IMPRESSION: No active disease. Electronically Signed   By: Norva Pavlov M.D.   On: 05/16/2023 13:14    Microbiology: No results found for this or any previous visit (from the past 240 hour(s)).   Labs: Basic Metabolic Panel: Recent Labs  Lab 05/16/23 1345 05/16/23 1423 05/16/23 2114  05/17/23 1610 05/18/23 0444 05/19/23 0645 05/20/23 0613 05/21/23 0450 05/23/23 0451  NA  --    < >  --  133* 134* 136 135 137 135  K  --    < >  --  3.9 3.1* 3.9 2.9* 3.6 3.5  CL  --    < >  --  97* 102 103 101 104 99  CO2  --   --   --  26 24 25 24 24 26   GLUCOSE  --    < >  --  150* 113* 157* 153* 207* 188*  BUN  --    < >  --  19 18 20 18 23  27*  CREATININE  --    < >  --  0.91 0.91 0.87 0.85 0.84 0.88  CALCIUM  --   --   --  8.8* 8.7* 8.4* 8.5* 8.7* 8.5*  MG 1.5*  --   --  1.7 1.6*  --   --   --   --   PHOS  --   --  3.1  --  3.7  --   --   --   --    < > = values in this interval not displayed.    Liver Function Tests: Recent Labs  Lab 05/16/23 1052  AST 20  ALT 16  ALKPHOS 104  BILITOT 0.5  PROT 7.5  ALBUMIN 3.0*   Recent Labs  Lab 05/16/23 1052  LIPASE 31   Recent Labs  Lab 05/16/23 1345  AMMONIA <10   CBC: Recent Labs  Lab 05/16/23 1052 05/16/23 1345 05/16/23 1423 05/17/23 0655 05/21/23 0450 05/22/23 0638  WBC 10.7* 10.6*  --  6.4 6.0 6.7  NEUTROABS  --  8.6*  --   --   --   --   HGB 11.8* 11.7* 12.6* 10.6* 10.2* 10.5*  HCT 38.1* 36.8* 37.0* 33.3* 32.0* 32.6*  MCV 86.2 84.2  --  82.0 82.1 83.0  PLT 212 204  --  200 192 194   Cardiac Enzymes: Recent Labs  Lab 05/16/23 2114  CKTOTAL 21*   BNP: BNP (last 3 results) No results for input(s): "BNP" in the last 8760 hours.  ProBNP (last 3 results) No results for input(s): "PROBNP" in the last 8760 hours.  CBG: Recent Labs  Lab 05/22/23 1616 05/22/23 2001 05/23/23 0007 05/23/23 0442 05/23/23 0746  GLUCAP 200* 171* 131* 194* 118*       Signed:  Rhetta Mura MD   [facility] 05/23/2023, 8:45 AM

## 2023-05-23 NOTE — TOC Progression Note (Addendum)
Transition of Care [facility]) - Progression Note    Patient Details  Name: [name] MRN: [id number] Date of Birth: 03/16/1946  [facility] CM/SW Contact  Inis Sizer, LCSW Phone Number: 05/23/2023, 10:52 AM  Clinical Narrative:    CSW spoke with staff at [facility] to confirm patient can be admitted into the facility today.  Patient will go to room 610B. Patient will be transported by his ex wife (per LPN). The number to call for report is [phone].  CSW spoke with patient's ex wife [name] [phone]) who states she is in route to pick up the patient and take him to [facility].  Expected Discharge Plan: Skilled Nursing Facility Barriers to Discharge: Continued Medical Work up, SNF Pending bed offer  Expected Discharge Plan and Services In-house Referral: Clinical Social Work   Post Acute Care Choice: Skilled Nursing Facility Living arrangements for the past 2 months: Single Family Home Expected Discharge Date: 05/23/23                                     Social Determinants of Health (SDOH) Interventions SDOH Screenings   Food Insecurity: No Food Insecurity (12/28/2022)  Housing: Low Risk  (12/28/2022)  Transportation Needs: No Transportation Needs (12/28/2022)  Utilities: Not At Risk (12/28/2022)  Tobacco Use: Medium Risk (05/16/2023)    Readmission Risk Interventions    01/21/2023    9:13 AM  Readmission Risk Prevention Plan  Transportation Screening Complete  Medication Review (RN Care Manager) Complete  PCP or Specialist appointment within 3-5 days of discharge Complete  HRI or Home Care Consult Complete  SW Recovery Care/Counseling Consult Complete  Palliative Care Screening Not Applicable  Skilled Nursing Facility Complete

## 2023-05-30 DIAGNOSIS — R2689 Other abnormalities of gait and mobility: Secondary | ICD-10-CM

## 2023-05-30 DIAGNOSIS — I10 Essential (primary) hypertension: Secondary | ICD-10-CM

## 2023-05-30 DIAGNOSIS — E119 Type 2 diabetes mellitus without complications: Secondary | ICD-10-CM

## 2023-05-30 DIAGNOSIS — R627 Adult failure to thrive: Secondary | ICD-10-CM | POA: Diagnosis not present

## 2023-05-30 DIAGNOSIS — E271 Primary adrenocortical insufficiency: Secondary | ICD-10-CM | POA: Diagnosis not present

## 2023-05-30 DIAGNOSIS — M069 Rheumatoid arthritis, unspecified: Secondary | ICD-10-CM | POA: Diagnosis not present

## 2023-05-30 DIAGNOSIS — G9340 Encephalopathy, unspecified: Secondary | ICD-10-CM | POA: Diagnosis not present

## 2023-05-30 DIAGNOSIS — J849 Interstitial pulmonary disease, unspecified: Secondary | ICD-10-CM

## 2023-05-30 DIAGNOSIS — M6281 Muscle weakness (generalized): Secondary | ICD-10-CM

## 2023-05-30 DIAGNOSIS — E43 Unspecified severe protein-calorie malnutrition: Secondary | ICD-10-CM

## 2023-05-30 DIAGNOSIS — K219 Gastro-esophageal reflux disease without esophagitis: Secondary | ICD-10-CM

## 2023-06-03 ENCOUNTER — Other Ambulatory Visit: Payer: Self-pay

## 2023-06-03 ENCOUNTER — Encounter (HOSPITAL_COMMUNITY): Payer: Self-pay

## 2023-06-03 ENCOUNTER — Emergency Department (HOSPITAL_COMMUNITY)
Admission: EM | Admit: 2023-06-03 | Discharge: 2023-06-04 | Disposition: A | Discharge status: Home / Self Care | Payer: Medicare Other | Attending: Emergency Medicine | Admitting: Emergency Medicine

## 2023-06-03 DIAGNOSIS — Z7984 Long term (current) use of oral hypoglycemic drugs: Secondary | ICD-10-CM | POA: Diagnosis not present

## 2023-06-03 DIAGNOSIS — Z7689 Persons encountering health services in other specified circumstances: Secondary | ICD-10-CM

## 2023-06-03 DIAGNOSIS — Z79899 Other long term (current) drug therapy: Secondary | ICD-10-CM | POA: Insufficient documentation

## 2023-06-03 DIAGNOSIS — I10 Essential (primary) hypertension: Secondary | ICD-10-CM | POA: Insufficient documentation

## 2023-06-03 DIAGNOSIS — E43 Unspecified severe protein-calorie malnutrition: Secondary | ICD-10-CM

## 2023-06-03 DIAGNOSIS — E119 Type 2 diabetes mellitus without complications: Secondary | ICD-10-CM | POA: Insufficient documentation

## 2023-06-03 DIAGNOSIS — K9423 Gastrostomy malfunction: Secondary | ICD-10-CM

## 2023-06-03 DIAGNOSIS — Z931 Gastrostomy status: Secondary | ICD-10-CM

## 2023-06-03 DIAGNOSIS — R627 Adult failure to thrive: Secondary | ICD-10-CM

## 2023-06-03 NOTE — ED Provider Notes (Signed)
[place] EMERGENCY DEPARTMENT AT [facility] Provider Note   CSN: [id number] Arrival date & time: 06/03/23  1222     History  No chief complaint on file.   [name] is a 77 y.o. male.  Patient is a 77 year old male with a history of hypertension, RA, esophageal spasm, diabetes, recurrent EGDs with dilation but difficulty eating with chronic PEG tube who is presenting today because he reports it started leaking yesterday.  He reports the tube was last changed about a year ago when he pulled it out by accident.  Today it is just been leaking.  He has no other complaints.  He has not had nausea vomiting and denies any pain in his abdomen.  Patient has an 54 French PEG tube present.  The history is provided by the patient.       Home Medications Prior to Admission medications   Medication Sig Start Date End Date Taking? Authorizing Provider  abatacept (ORENCIA) 250 MG injection Inject 250 mg into the vein every 30 (thirty) days. Patient taking differently: Inject 750 mg into the vein every 30 (thirty) days. 02/12/21   Evlyn Kanner, MD  Acetaminophen (TYLENOL ARTHRITIS PAIN PO) Take 325 mg by mouth as needed (As needed).    [provider]  amLODipine (NORVASC) 5 MG tablet Take 1 tablet (5 mg total) by mouth daily. 05/23/23   Rhetta Mura, MD  atorvastatin (LIPITOR) 40 MG tablet Place 1 tablet (40 mg total) into feeding tube daily. 01/28/23   Marrianne Mood, MD  diclofenac Sodium (VOLTAREN) 1 % GEL Apply 2 g topically 4 (four) times daily.    [provider]  famotidine (PEPCID) 40 MG tablet Place 1 tablet (40 mg total) into feeding tube daily. 05/23/23   Rhetta Mura, MD  Feeding Supplies KIT 1 Box by Does not apply route daily. Patient will need pump and supplies for continuous tube feeds and 60mL syringes for free water flushes 12/31/22   Mapp, Gaylyn Cheers, MD  ferrous sulfate 300 (60 Fe) MG/5ML syrup Place 5 mLs (300 mg total) into  feeding tube daily. 01/28/23   Marrianne Mood, MD  fludrocortisone (FLORINEF) 0.1 MG tablet Place 1 tablet (0.1 mg total) into feeding tube daily. 01/29/23   Marrianne Mood, MD  fluticasone (FLONASE) 50 MCG/ACT nasal spray Place 1 spray into both nostrils daily.    [provider]  folic acid (FOLVITE) 1 MG tablet Place 1 tablet (1 mg total) into feeding tube daily. 01/28/23   Marrianne Mood, MD  hydrocortisone (CORTEF) 10 MG tablet Place 1 tablet (10 mg total) into feeding tube daily at 2 PM. 01/28/23   Marrianne Mood, MD  hydrocortisone (CORTEF) 5 MG tablet Place 3 tablets (15 mg total) into feeding tube daily before breakfast. 01/28/23   Marrianne Mood, MD  levothyroxine (SYNTHROID) 50 MCG tablet Place 1 tablet (50 mcg total) into feeding tube daily. 01/28/23   Marrianne Mood, MD  loratadine (CLARITIN) 10 MG tablet Take 10 mg by mouth daily.    [provider]  magnesium oxide (MAG-OX) 400 (240 Mg) MG tablet Place 1 tablet (400 mg total) into feeding tube daily. 01/28/23   Marrianne Mood, MD  metFORMIN (GLUCOPHAGE) 500 MG tablet Place 1 tablet (500 mg total) into feeding tube daily. 01/28/23   Marrianne Mood, MD  methotrexate 50 MG/2ML injection Inject 15 mg into the skin once a week. 04/25/23 04/17/30  [provider]  Nutritional Supplements (FEEDING SUPPLEMENT, OSMOLITE 1.5 CAL,) LIQD Place 1,530 mLs  into feeding tube daily. Run for 18 hours from 1600 to 1000 01/28/23   Marrianne Mood, MD  omeprazole (PRILOSEC OTC) 20 MG tablet Take 2 tablets (40 mg total) by mouth daily. 05/23/23   Rhetta Mura, MD  Protein (FEEDING SUPPLEMENT, PROSOURCE TF20,) liquid Place 60 mLs into feeding tube daily. 05/23/23   Rhetta Mura, MD  sucralfate (CARAFATE) 1 GM/10ML suspension Place 10 mLs (1 g total) into feeding tube every 6 (six) hours. 01/28/23   Marrianne Mood, MD  Water For Irrigation, Sterile (FREE WATER) SOLN Place 200 mLs into feeding tube 6 (six) times  daily. 05/23/23   Rhetta Mura, MD      Allergies    Patient has no known allergies.    Review of Systems   Review of Systems  Physical Exam Updated Vital Signs BP 107/61   Pulse 86   Temp 97.6 F (36.4 C) (Oral)   Resp 18   SpO2 99%  Physical Exam Vitals reviewed.  HENT:     Head: Normocephalic.  Cardiovascular:     Rate and Rhythm: Normal rate.  Pulmonary:     Effort: Pulmonary effort is normal.  Abdominal:     Comments: Multiple well-healed abdominal scars, PEG tube present in the left upper quadrant.  Some leaking from the end of the PEG.  Neurological:     Mental Status: He is alert. Mental status is at baseline.     ED Results / Procedures / Treatments   Labs (all labs ordered are listed, but only abnormal results are displayed) Labs Reviewed - No data to display  EKG None  Radiology No results found.  Procedures Gastrostomy tube replacement  Date/Time: 06/03/2023 12:49 PM  Performed by: Gwyneth Sprout, MD Authorized by: Gwyneth Sprout, MD  Consent: Verbal consent obtained. Consent given by: patient Patient understanding: patient states understanding of the procedure being performed Patient identity confirmed: verbally with patient Local anesthesia used: no  Anesthesia: Local anesthesia used: no  Sedation: Patient sedated: no  Patient tolerance: patient tolerated the procedure well with no immediate complications Comments: 48 French G-tube was removed and the new one was replaced without difficulty.       Medications Ordered in ED Medications - No data to display  ED Course/ Medical Decision Making/ A&P                             Medical Decision Making  Patient presenting today because his G-tube is leaking.  He has no other complaints at this time.  G-tube replaced as above without complication.  He uses an 65 [place].        Final Clinical Impression(s) / ED Diagnoses Final diagnoses:  Gastrostomy tube  dysfunction [facility])    Rx / DC Orders ED Discharge Orders     None         Gwyneth Sprout, MD 06/03/23 1250

## 2023-06-03 NOTE — ED Triage Notes (Signed)
Pt bib ems from [facility]; c/o leaking j tube that started yesterday, worse today; pt has no complaints; a and o x 4; ambulatory; 132/80, P 98, 100% RA, cbg 159

## 2023-06-03 NOTE — ED Notes (Signed)
Patient verbalizes understanding of discharge instructions. Opportunity for questioning and answers were provided. 

## 2023-06-03 NOTE — ED Notes (Signed)
Report given to Inetta Fermo, nurse at [facility]

## 2023-06-03 NOTE — Discharge Instructions (Signed)
new G-tube was placed.

## 2023-07-12 ENCOUNTER — Other Ambulatory Visit (HOSPITAL_COMMUNITY): Payer: Self-pay | Admitting: Neuroradiology

## 2023-07-12 DIAGNOSIS — I639 Cerebral infarction, unspecified: Secondary | ICD-10-CM

## 2023-07-20 ENCOUNTER — Encounter (HOSPITAL_COMMUNITY): Payer: Self-pay

## 2023-07-21 ENCOUNTER — Encounter (HOSPITAL_COMMUNITY): Payer: Self-pay

## 2023-07-27 ENCOUNTER — Ambulatory Visit (HOSPITAL_COMMUNITY)
Admission: RE | Admit: 2023-07-27 | Discharge: 2023-07-27 | Disposition: A | Discharge status: Home / Self Care | Payer: Medicare Other | Source: Ambulatory Visit | Attending: Neuroradiology | Admitting: Neuroradiology

## 2023-07-27 ENCOUNTER — Encounter (HOSPITAL_COMMUNITY): Payer: Self-pay

## 2023-07-27 DIAGNOSIS — I639 Cerebral infarction, unspecified: Secondary | ICD-10-CM

## 2023-07-28 ENCOUNTER — Telehealth (HOSPITAL_COMMUNITY): Payer: Self-pay

## 2023-07-28 NOTE — Telephone Encounter (Signed)
Called pt regarding recent imaging, no answer, left vm. AB  

## 2023-07-28 NOTE — Telephone Encounter (Signed)
Called to inform pt that Dr. Quay Burow reviewed his recent ultrasound. Per Dr. Quay Burow: ultrasound is good and the stent is patent. He thanked me for letting him know. AB

## 2023-11-22 ENCOUNTER — Other Ambulatory Visit: Payer: Medicare Other

## 2023-11-29 ENCOUNTER — Ambulatory Visit: Payer: Medicare Other | Admitting: "Endocrinology

## 2024-01-19 ENCOUNTER — Encounter (HOSPITAL_COMMUNITY): Payer: Self-pay | Admitting: Family Medicine

## 2024-01-19 DIAGNOSIS — E119 Type 2 diabetes mellitus without complications: Secondary | ICD-10-CM

## 2024-01-20 ENCOUNTER — Other Ambulatory Visit (HOSPITAL_COMMUNITY): Payer: Self-pay | Admitting: Family Medicine

## 2024-01-20 DIAGNOSIS — R1031 Right lower quadrant pain: Secondary | ICD-10-CM

## 2024-01-20 DIAGNOSIS — E119 Type 2 diabetes mellitus without complications: Secondary | ICD-10-CM

## 2024-01-30 ENCOUNTER — Ambulatory Visit (HOSPITAL_COMMUNITY)
Admission: RE | Admit: 2024-01-30 | Discharge: 2024-01-30 | Disposition: A | Discharge status: Home / Self Care | Payer: Medicare Other | Source: Ambulatory Visit | Attending: Family Medicine | Admitting: Family Medicine

## 2024-01-30 DIAGNOSIS — E119 Type 2 diabetes mellitus without complications: Secondary | ICD-10-CM | POA: Insufficient documentation

## 2024-01-30 DIAGNOSIS — R1031 Right lower quadrant pain: Secondary | ICD-10-CM | POA: Diagnosis present

## 2024-01-30 MED ORDER — IOHEXOL 300 MG/ML  SOLN
75.0000 mL | Freq: Once | INTRAMUSCULAR | Status: AC | PRN
  Administered 2024-01-30: 75 mL via INTRAVENOUS

## 2024-02-14 ENCOUNTER — Emergency Department (HOSPITAL_COMMUNITY): Payer: Medicare Other

## 2024-02-14 ENCOUNTER — Inpatient Hospital Stay (HOSPITAL_COMMUNITY)
Admission: EM | Admit: 2024-02-14 | Discharge: 2024-02-20 | DRG: 562 | Disposition: A | Discharge status: Skilled Nursing Facility | Payer: Medicare Other | Attending: Internal Medicine | Admitting: Internal Medicine

## 2024-02-14 ENCOUNTER — Encounter (HOSPITAL_COMMUNITY): Payer: Self-pay

## 2024-02-14 DIAGNOSIS — Z934 Other artificial openings of gastrointestinal tract status: Secondary | ICD-10-CM

## 2024-02-14 DIAGNOSIS — Z85820 Personal history of malignant melanoma of skin: Secondary | ICD-10-CM

## 2024-02-14 DIAGNOSIS — S0990XA Unspecified injury of head, initial encounter: Secondary | ICD-10-CM | POA: Diagnosis present

## 2024-02-14 DIAGNOSIS — M059 Rheumatoid arthritis with rheumatoid factor, unspecified: Secondary | ICD-10-CM | POA: Diagnosis present

## 2024-02-14 DIAGNOSIS — Z85810 Personal history of malignant neoplasm of tongue: Secondary | ICD-10-CM

## 2024-02-14 DIAGNOSIS — Z8679 Personal history of other diseases of the circulatory system: Secondary | ICD-10-CM

## 2024-02-14 DIAGNOSIS — Z931 Gastrostomy status: Secondary | ICD-10-CM

## 2024-02-14 DIAGNOSIS — S42291A Other displaced fracture of upper end of right humerus, initial encounter for closed fracture: Secondary | ICD-10-CM | POA: Diagnosis not present

## 2024-02-14 DIAGNOSIS — R609 Edema, unspecified: Secondary | ICD-10-CM | POA: Diagnosis present

## 2024-02-14 DIAGNOSIS — Z8673 Personal history of transient ischemic attack (TIA), and cerebral infarction without residual deficits: Secondary | ICD-10-CM

## 2024-02-14 DIAGNOSIS — Y92009 Unspecified place in unspecified non-institutional (private) residence as the place of occurrence of the external cause: Secondary | ICD-10-CM

## 2024-02-14 DIAGNOSIS — T148XXA Other injury of unspecified body region, initial encounter: Secondary | ICD-10-CM

## 2024-02-14 DIAGNOSIS — Z7952 Long term (current) use of systemic steroids: Secondary | ICD-10-CM

## 2024-02-14 DIAGNOSIS — Y9301 Activity, walking, marching and hiking: Secondary | ICD-10-CM | POA: Diagnosis present

## 2024-02-14 DIAGNOSIS — E271 Primary adrenocortical insufficiency: Secondary | ICD-10-CM | POA: Diagnosis present

## 2024-02-14 DIAGNOSIS — S42309A Unspecified fracture of shaft of humerus, unspecified arm, initial encounter for closed fracture: Secondary | ICD-10-CM | POA: Diagnosis present

## 2024-02-14 DIAGNOSIS — E119 Type 2 diabetes mellitus without complications: Secondary | ICD-10-CM

## 2024-02-14 DIAGNOSIS — D649 Anemia, unspecified: Secondary | ICD-10-CM

## 2024-02-14 DIAGNOSIS — F0392 Unspecified dementia, unspecified severity, with psychotic disturbance: Secondary | ICD-10-CM | POA: Diagnosis present

## 2024-02-14 DIAGNOSIS — Z8589 Personal history of malignant neoplasm of other organs and systems: Secondary | ICD-10-CM

## 2024-02-14 DIAGNOSIS — E2740 Unspecified adrenocortical insufficiency: Secondary | ICD-10-CM

## 2024-02-14 DIAGNOSIS — Z8249 Family history of ischemic heart disease and other diseases of the circulatory system: Secondary | ICD-10-CM

## 2024-02-14 DIAGNOSIS — I10 Essential (primary) hypertension: Secondary | ICD-10-CM | POA: Diagnosis present

## 2024-02-14 DIAGNOSIS — E785 Hyperlipidemia, unspecified: Secondary | ICD-10-CM | POA: Diagnosis present

## 2024-02-14 DIAGNOSIS — E43 Unspecified severe protein-calorie malnutrition: Secondary | ICD-10-CM | POA: Diagnosis present

## 2024-02-14 DIAGNOSIS — Z515 Encounter for palliative care: Secondary | ICD-10-CM

## 2024-02-14 DIAGNOSIS — R296 Repeated falls: Secondary | ICD-10-CM | POA: Diagnosis present

## 2024-02-14 DIAGNOSIS — Z86711 Personal history of pulmonary embolism: Secondary | ICD-10-CM

## 2024-02-14 DIAGNOSIS — I2510 Atherosclerotic heart disease of native coronary artery without angina pectoris: Secondary | ICD-10-CM | POA: Diagnosis present

## 2024-02-14 DIAGNOSIS — M25411 Effusion, right shoulder: Secondary | ICD-10-CM | POA: Diagnosis present

## 2024-02-14 DIAGNOSIS — Z8616 Personal history of COVID-19: Secondary | ICD-10-CM

## 2024-02-14 DIAGNOSIS — W010XXA Fall on same level from slipping, tripping and stumbling without subsequent striking against object, initial encounter: Secondary | ICD-10-CM | POA: Diagnosis present

## 2024-02-14 DIAGNOSIS — E1165 Type 2 diabetes mellitus with hyperglycemia: Secondary | ICD-10-CM | POA: Diagnosis present

## 2024-02-14 DIAGNOSIS — Z87891 Personal history of nicotine dependence: Secondary | ICD-10-CM

## 2024-02-14 DIAGNOSIS — Z9221 Personal history of antineoplastic chemotherapy: Secondary | ICD-10-CM

## 2024-02-14 DIAGNOSIS — Z9049 Acquired absence of other specified parts of digestive tract: Secondary | ICD-10-CM

## 2024-02-14 DIAGNOSIS — Z79899 Other long term (current) drug therapy: Secondary | ICD-10-CM

## 2024-02-14 DIAGNOSIS — Z923 Personal history of irradiation: Secondary | ICD-10-CM

## 2024-02-14 DIAGNOSIS — E039 Hypothyroidism, unspecified: Secondary | ICD-10-CM | POA: Diagnosis present

## 2024-02-14 DIAGNOSIS — W19XXXA Unspecified fall, initial encounter: Principal | ICD-10-CM

## 2024-02-14 DIAGNOSIS — Z7984 Long term (current) use of oral hypoglycemic drugs: Secondary | ICD-10-CM

## 2024-02-14 DIAGNOSIS — M069 Rheumatoid arthritis, unspecified: Secondary | ICD-10-CM | POA: Diagnosis present

## 2024-02-14 DIAGNOSIS — Z751 Person awaiting admission to adequate facility elsewhere: Secondary | ICD-10-CM

## 2024-02-14 DIAGNOSIS — Z66 Do not resuscitate: Secondary | ICD-10-CM | POA: Diagnosis present

## 2024-02-14 DIAGNOSIS — Z6821 Body mass index (BMI) 21.0-21.9, adult: Secondary | ICD-10-CM

## 2024-02-14 DIAGNOSIS — Z79890 Hormone replacement therapy (postmenopausal): Secondary | ICD-10-CM

## 2024-02-14 LAB — COMPREHENSIVE METABOLIC PANEL
ALT: 18 U/L (ref 0–44)
AST: 25 U/L (ref 15–41)
Albumin: 3.1 g/dL — ABNORMAL LOW (ref 3.5–5.0)
Alkaline Phosphatase: 83 U/L (ref 38–126)
Anion gap: 13 (ref 5–15)
BUN: 29 mg/dL — ABNORMAL HIGH (ref 8–23)
CO2: 27 mmol/L (ref 22–32)
Calcium: 9 mg/dL (ref 8.9–10.3)
Chloride: 100 mmol/L (ref 98–111)
Creatinine, Ser: 1 mg/dL (ref 0.61–1.24)
GFR, Estimated: >60 mL/min (ref >60)
Glucose, Bld: 197 mg/dL — ABNORMAL HIGH (ref 70–99)
Potassium: 4.7 mmol/L (ref 3.5–5.1)
Sodium: 140 mmol/L (ref 135–145)
Total Bilirubin: 0.6 mg/dL (ref 0.0–1.2)
Total Protein: 6.2 g/dL — ABNORMAL LOW (ref 6.5–8.1)

## 2024-02-14 LAB — CBC WITH DIFFERENTIAL/PLATELET
Abs Immature Granulocytes: 0.06 10*3/uL (ref 0.00–0.07)
Basophils Absolute: 0 10*3/uL (ref 0.0–0.1)
Basophils Relative: 0 %
Eosinophils Absolute: 0 10*3/uL (ref 0.0–0.5)
Eosinophils Relative: 0 %
HCT: 31.9 % — ABNORMAL LOW (ref 39.0–52.0)
Hemoglobin: 10.1 g/dL — ABNORMAL LOW (ref 13.0–17.0)
Immature Granulocytes: 1 %
Lymphocytes Relative: 2 %
Lymphs Abs: 0.3 10*3/uL — ABNORMAL LOW (ref 0.7–4.0)
MCH: 28.3 pg (ref 26.0–34.0)
MCHC: 31.7 g/dL (ref 30.0–36.0)
MCV: 89.4 fL (ref 80.0–100.0)
Monocytes Absolute: 1.1 10*3/uL — ABNORMAL HIGH (ref 0.1–1.0)
Monocytes Relative: 8 %
Neutro Abs: 11.6 10*3/uL — ABNORMAL HIGH (ref 1.7–7.7)
Neutrophils Relative %: 89 %
Platelets: 185 10*3/uL (ref 150–400)
RBC: 3.57 MIL/uL — ABNORMAL LOW (ref 4.22–5.81)
RDW: 17.8 % — ABNORMAL HIGH (ref 11.5–15.5)
WBC: 13 10*3/uL — ABNORMAL HIGH (ref 4.0–10.5)
nRBC: 0 % (ref 0.0–0.2)

## 2024-02-14 MED ORDER — HYDROMORPHONE HCL 1 MG/ML IJ SOLN
1.0000 mg | Freq: Once | INTRAMUSCULAR | Status: AC
  Administered 2024-02-14: 1 mg via INTRAVENOUS
  Filled 2024-02-14: qty 1

## 2024-02-14 MED ORDER — OXYCODONE HCL 5 MG PO TABS: 10.0000 mg | ORAL_TABLET | Freq: Once | ORAL | Status: DC

## 2024-02-14 MED ORDER — HYDROMORPHONE HCL 1 MG/ML IJ SOLN
1.0000 mg | Freq: Once | INTRAMUSCULAR | Status: AC
  Administered 2024-02-15: 1 mg via INTRAVENOUS
  Filled 2024-02-14: qty 1

## 2024-02-14 NOTE — Progress Notes (Signed)
Consult request received for right proximal humerus fracture. Have discussed with the requesting MD patient's stability, pain control, and ongoing work up. I have reviewed x-rays and developed a provisional plan including CT scan.   Fracture appears favorable and well aligned for nonsurgical management on the plain films. CT would confirm whether or not located.  Treatment would be sling and outpatient f/u next week if able to be discharged o/w full consultation will follow in the am.   Myrene Galas, MD [facility], [facility] [phone]

## 2024-02-14 NOTE — ED Triage Notes (Signed)
Tripped over dog at home at 0800. Hit head, not complaining of any headache. Complains of right shoulder pain. Bruising and swelling noted. No blood thinners. Alert and oriented x4. Been taking PO oxycodone 1 hr PTA for pain.

## 2024-02-15 DIAGNOSIS — E039 Hypothyroidism, unspecified: Secondary | ICD-10-CM | POA: Diagnosis present

## 2024-02-15 DIAGNOSIS — E1165 Type 2 diabetes mellitus with hyperglycemia: Secondary | ICD-10-CM | POA: Diagnosis present

## 2024-02-15 DIAGNOSIS — E43 Unspecified severe protein-calorie malnutrition: Secondary | ICD-10-CM | POA: Diagnosis present

## 2024-02-15 DIAGNOSIS — Z7984 Long term (current) use of oral hypoglycemic drugs: Secondary | ICD-10-CM | POA: Diagnosis not present

## 2024-02-15 DIAGNOSIS — T148XXA Other injury of unspecified body region, initial encounter: Secondary | ICD-10-CM | POA: Diagnosis present

## 2024-02-15 DIAGNOSIS — M059 Rheumatoid arthritis with rheumatoid factor, unspecified: Secondary | ICD-10-CM | POA: Diagnosis present

## 2024-02-15 DIAGNOSIS — R627 Adult failure to thrive: Secondary | ICD-10-CM | POA: Diagnosis not present

## 2024-02-15 DIAGNOSIS — S42291A Other displaced fracture of upper end of right humerus, initial encounter for closed fracture: Secondary | ICD-10-CM | POA: Diagnosis present

## 2024-02-15 DIAGNOSIS — D649 Anemia, unspecified: Secondary | ICD-10-CM | POA: Diagnosis present

## 2024-02-15 DIAGNOSIS — Z79890 Hormone replacement therapy (postmenopausal): Secondary | ICD-10-CM | POA: Diagnosis not present

## 2024-02-15 DIAGNOSIS — M069 Rheumatoid arthritis, unspecified: Secondary | ICD-10-CM | POA: Diagnosis not present

## 2024-02-15 DIAGNOSIS — Z8249 Family history of ischemic heart disease and other diseases of the circulatory system: Secondary | ICD-10-CM | POA: Diagnosis not present

## 2024-02-15 DIAGNOSIS — Z934 Other artificial openings of gastrointestinal tract status: Secondary | ICD-10-CM | POA: Diagnosis not present

## 2024-02-15 DIAGNOSIS — Z66 Do not resuscitate: Secondary | ICD-10-CM | POA: Diagnosis present

## 2024-02-15 DIAGNOSIS — S42209A Unspecified fracture of upper end of unspecified humerus, initial encounter for closed fracture: Secondary | ICD-10-CM

## 2024-02-15 DIAGNOSIS — I10 Essential (primary) hypertension: Secondary | ICD-10-CM | POA: Diagnosis present

## 2024-02-15 DIAGNOSIS — E2740 Unspecified adrenocortical insufficiency: Secondary | ICD-10-CM

## 2024-02-15 DIAGNOSIS — Y9301 Activity, walking, marching and hiking: Secondary | ICD-10-CM | POA: Diagnosis present

## 2024-02-15 DIAGNOSIS — Z87891 Personal history of nicotine dependence: Secondary | ICD-10-CM | POA: Diagnosis not present

## 2024-02-15 DIAGNOSIS — E271 Primary adrenocortical insufficiency: Secondary | ICD-10-CM | POA: Diagnosis present

## 2024-02-15 DIAGNOSIS — S0990XA Unspecified injury of head, initial encounter: Secondary | ICD-10-CM | POA: Diagnosis present

## 2024-02-15 DIAGNOSIS — Z515 Encounter for palliative care: Secondary | ICD-10-CM | POA: Diagnosis not present

## 2024-02-15 DIAGNOSIS — M25411 Effusion, right shoulder: Secondary | ICD-10-CM | POA: Diagnosis present

## 2024-02-15 DIAGNOSIS — Z85820 Personal history of malignant melanoma of skin: Secondary | ICD-10-CM | POA: Diagnosis not present

## 2024-02-15 DIAGNOSIS — F0392 Unspecified dementia, unspecified severity, with psychotic disturbance: Secondary | ICD-10-CM | POA: Diagnosis present

## 2024-02-15 DIAGNOSIS — R609 Edema, unspecified: Secondary | ICD-10-CM | POA: Diagnosis present

## 2024-02-15 DIAGNOSIS — Z7189 Other specified counseling: Secondary | ICD-10-CM | POA: Diagnosis not present

## 2024-02-15 DIAGNOSIS — Y92009 Unspecified place in unspecified non-institutional (private) residence as the place of occurrence of the external cause: Secondary | ICD-10-CM | POA: Diagnosis not present

## 2024-02-15 DIAGNOSIS — I2510 Atherosclerotic heart disease of native coronary artery without angina pectoris: Secondary | ICD-10-CM | POA: Diagnosis present

## 2024-02-15 DIAGNOSIS — E785 Hyperlipidemia, unspecified: Secondary | ICD-10-CM | POA: Diagnosis present

## 2024-02-15 DIAGNOSIS — S42309A Unspecified fracture of shaft of humerus, unspecified arm, initial encounter for closed fracture: Secondary | ICD-10-CM | POA: Diagnosis present

## 2024-02-15 DIAGNOSIS — Z931 Gastrostomy status: Secondary | ICD-10-CM | POA: Diagnosis not present

## 2024-02-15 DIAGNOSIS — R296 Repeated falls: Secondary | ICD-10-CM | POA: Diagnosis present

## 2024-02-15 DIAGNOSIS — W19XXXA Unspecified fall, initial encounter: Secondary | ICD-10-CM | POA: Diagnosis not present

## 2024-02-15 DIAGNOSIS — Z8616 Personal history of COVID-19: Secondary | ICD-10-CM | POA: Diagnosis not present

## 2024-02-15 DIAGNOSIS — W010XXA Fall on same level from slipping, tripping and stumbling without subsequent striking against object, initial encounter: Secondary | ICD-10-CM | POA: Diagnosis present

## 2024-02-15 LAB — GLUCOSE, CAPILLARY
Glucose-Capillary: 178 mg/dL — ABNORMAL HIGH (ref 70–99)
Glucose-Capillary: 120 mg/dL — ABNORMAL HIGH (ref 70–99)

## 2024-02-15 LAB — CBG MONITORING, ED
Glucose-Capillary: 165 mg/dL — ABNORMAL HIGH (ref 70–99)
Glucose-Capillary: 162 mg/dL — ABNORMAL HIGH (ref 70–99)
Glucose-Capillary: 171 mg/dL — ABNORMAL HIGH (ref 70–99)
Glucose-Capillary: 136 mg/dL — ABNORMAL HIGH (ref 70–99)
Glucose-Capillary: 151 mg/dL — ABNORMAL HIGH (ref 70–99)

## 2024-02-15 LAB — HEMOGLOBIN A1C
Hgb A1c MFr Bld: 7.2 % — ABNORMAL HIGH (ref 4.8–5.6)
Mean Plasma Glucose: 159.94 mg/dL

## 2024-02-15 MED ORDER — FERROUS SULFATE 300 (60 FE) MG/5ML PO SOLN: 300.0000 mg | Freq: Every day | ORAL | Status: DC

## 2024-02-15 MED ORDER — FOLIC ACID 1 MG PO TABS
1.0000 mg | ORAL_TABLET | Freq: Every day | ORAL | Status: DC
  Filled 2024-02-15: qty 1

## 2024-02-15 MED ORDER — PREDNISONE 5 MG PO TABS
5.0000 mg | ORAL_TABLET | Freq: Every day | ORAL | Status: DC
  Filled 2024-02-15: qty 1

## 2024-02-15 MED ORDER — FAMOTIDINE 20 MG PO TABS
40.0000 mg | ORAL_TABLET | Freq: Two times a day (BID) | ORAL | Status: DC
  Administered 2024-02-15: 40 mg via ORAL
  Filled 2024-02-15 (×2): qty 2

## 2024-02-15 MED ORDER — PREDNISONE 5 MG PO TABS
5.0000 mg | ORAL_TABLET | Freq: Every day | ORAL | Status: DC
  Administered 2024-02-15: 5 mg
  Filled 2024-02-15: qty 1

## 2024-02-15 MED ORDER — CITALOPRAM HYDROBROMIDE 10 MG PO TABS
10.0000 mg | ORAL_TABLET | Freq: Every day | ORAL | Status: DC
  Administered 2024-02-15: 10 mg
  Filled 2024-02-15: qty 1

## 2024-02-15 MED ORDER — LEVOTHYROXINE SODIUM 50 MCG PO TABS: 50.0000 ug | ORAL_TABLET | Freq: Every day | ORAL | Status: DC

## 2024-02-15 MED ORDER — FOLIC ACID 1 MG PO TABS
1.0000 mg | ORAL_TABLET | Freq: Every day | ORAL | Status: DC
  Administered 2024-02-15: 1 mg
  Filled 2024-02-15: qty 1

## 2024-02-15 MED ORDER — FERROUS SULFATE 300 (60 FE) MG/5ML PO SOLN
300.0000 mg | Freq: Every day | ORAL | Status: DC
  Filled 2024-02-15: qty 5

## 2024-02-15 MED ORDER — AMLODIPINE BESYLATE 5 MG PO TABS
5.0000 mg | ORAL_TABLET | Freq: Every day | ORAL | Status: DC
  Filled 2024-02-15: qty 1

## 2024-02-15 MED ORDER — HYDROCORTISONE SOD SUC (PF) 100 MG IJ SOLR: 25.0000 mg | Freq: Three times a day (TID) | INTRAMUSCULAR | Status: DC

## 2024-02-15 MED ORDER — SUCRALFATE 1 GM/10ML PO SUSP
1.0000 g | Freq: Four times a day (QID) | ORAL | Status: DC
  Administered 2024-02-15 – 2024-02-16 (×2): 1 g via ORAL
  Filled 2024-02-15 (×3): qty 10

## 2024-02-15 MED ORDER — CITALOPRAM HYDROBROMIDE 10 MG PO TABS: 10.0000 mg | ORAL_TABLET | Freq: Every day | ORAL | Status: DC

## 2024-02-15 MED ORDER — INSULIN ASPART 100 UNIT/ML IJ SOLN
0.0000 [IU] | INTRAMUSCULAR | Status: DC
  Administered 2024-02-15 (×3): 2 [IU] via SUBCUTANEOUS
  Administered 2024-02-15: 1 [IU] via SUBCUTANEOUS
  Administered 2024-02-16: 2 [IU] via SUBCUTANEOUS

## 2024-02-15 MED ORDER — FAMOTIDINE 20 MG PO TABS
40.0000 mg | ORAL_TABLET | Freq: Two times a day (BID) | ORAL | Status: DC
  Administered 2024-02-15: 40 mg
  Filled 2024-02-15: qty 2

## 2024-02-15 MED ORDER — NALOXONE HCL 0.4 MG/ML IJ SOLN: 0.4000 mg | INTRAMUSCULAR | Status: DC | PRN

## 2024-02-15 MED ORDER — AMLODIPINE BESYLATE 5 MG PO TABS
5.0000 mg | ORAL_TABLET | Freq: Every day | ORAL | Status: DC
  Administered 2024-02-15: 5 mg
  Filled 2024-02-15: qty 1

## 2024-02-15 MED ORDER — LEVOTHYROXINE SODIUM 50 MCG PO TABS
50.0000 ug | ORAL_TABLET | Freq: Every day | ORAL | Status: DC
  Filled 2024-02-15: qty 1

## 2024-02-15 MED ORDER — FLUTICASONE PROPIONATE 50 MCG/ACT NA SUSP: 2.0000 | Freq: Every day | NASAL | Status: DC | PRN

## 2024-02-15 MED ORDER — LORAZEPAM 2 MG/ML IJ SOLN
1.0000 mg | INTRAMUSCULAR | Status: DC | PRN
  Administered 2024-02-15 – 2024-02-17 (×4): 1 mg via INTRAVENOUS
  Filled 2024-02-15 (×4): qty 1

## 2024-02-15 MED ORDER — ACETAMINOPHEN 325 MG PO TABS
650.0000 mg | ORAL_TABLET | Freq: Four times a day (QID) | ORAL | Status: DC | PRN
  Filled 2024-02-15: qty 2

## 2024-02-15 MED ORDER — FLUDROCORTISONE ACETATE 0.1 MG PO TABS
0.1000 mg | ORAL_TABLET | Freq: Every day | ORAL | Status: DC
  Filled 2024-02-15: qty 1

## 2024-02-15 MED ORDER — OXYCODONE HCL 5 MG PO TABS: 5.0000 mg | ORAL_TABLET | Freq: Four times a day (QID) | ORAL | Status: DC | PRN

## 2024-02-15 MED ORDER — ATORVASTATIN CALCIUM 40 MG PO TABS
40.0000 mg | ORAL_TABLET | Freq: Every day | ORAL | Status: DC
Start: 2024-02-16 — End: 2024-02-16
  Filled 2024-02-15: qty 1

## 2024-02-15 MED ORDER — MORPHINE SULFATE (PF) 4 MG/ML IV SOLN
4.0000 mg | Freq: Once | INTRAVENOUS | Status: AC
  Administered 2024-02-15: 4 mg via INTRAVENOUS
  Filled 2024-02-15: qty 1

## 2024-02-15 MED ORDER — FREE WATER
200.0000 mL | Freq: Every day | Status: DC
  Administered 2024-02-15 – 2024-02-20 (×29): 200 mL

## 2024-02-15 MED ORDER — ATORVASTATIN CALCIUM 40 MG PO TABS
40.0000 mg | ORAL_TABLET | Freq: Every day | ORAL | Status: DC
  Administered 2024-02-15: 40 mg
  Filled 2024-02-15: qty 1

## 2024-02-15 MED ORDER — HYDROMORPHONE HCL 1 MG/ML IJ SOLN
1.0000 mg | INTRAMUSCULAR | Status: DC | PRN
  Administered 2024-02-15 – 2024-02-20 (×17): 1 mg via INTRAVENOUS
  Filled 2024-02-15 (×16): qty 1

## 2024-02-15 MED ORDER — SUCRALFATE 1 GM/10ML PO SUSP
1.0000 g | Freq: Four times a day (QID) | ORAL | Status: DC
Start: 2024-02-15 — End: 2024-02-15
  Administered 2024-02-15: 1 g
  Filled 2024-02-15 (×3): qty 10

## 2024-02-15 MED ORDER — HALOPERIDOL LACTATE 5 MG/ML IJ SOLN
2.0000 mg | Freq: Four times a day (QID) | INTRAMUSCULAR | Status: DC | PRN
  Administered 2024-02-17 – 2024-02-19 (×3): 2 mg via INTRAVENOUS
  Filled 2024-02-15 (×4): qty 1

## 2024-02-15 MED ORDER — HYDROCORTISONE SOD SUC (PF) 100 MG IJ SOLR
37.5000 mg | Freq: Two times a day (BID) | INTRAMUSCULAR | Status: DC
  Administered 2024-02-15 – 2024-02-16 (×3): 37.5 mg via INTRAVENOUS
  Filled 2024-02-15: qty 0.75
  Filled 2024-02-15: qty 2
  Filled 2024-02-15: qty 0.75

## 2024-02-15 NOTE — Progress Notes (Signed)
Transition of Care [facility]) - CAGE-AID Screening   Patient Details  Name: [name] MRN: [id number] Date of Birth: May 06, 1946    Hewitt Shorts, RN Trauma Response Nurse Phone Number: [phone] 02/15/2024, 2:09 PM     CAGE-AID Screening:    Have You Ever Felt You Ought to Cut Down on Your Drinking or Drug Use?: No Have People Annoyed You By Critizing Your Drinking Or Drug Use?: No Have You Felt Bad Or Guilty About Your Drinking Or Drug Use?: No Have You Ever Had a Drink or Used Drugs First Thing In The Morning to Steady Your Nerves or to Get Rid of a Hangover?: No CAGE-AID Score: 0  Substance Abuse Education Offered: (S) No (Denies drinking ETOH/drug use- services not needed)

## 2024-02-15 NOTE — ED Notes (Signed)
 Ortho at bedside.

## 2024-02-15 NOTE — ED Provider Notes (Signed)
[place] EMERGENCY DEPARTMENT AT [facility] Provider Note  CSN: [id number] Arrival date & time: 02/14/24 1858  Chief Complaint(s) Fall  HPI [name] is a 78 y.o. male with history of diabetes, rheumatoid arthritis, adrenal insufficiency, prior stroke, interstitial lung disease, debility, presenting with fall.  Patient reports that he was walking a dog when he tripped and fell, landing on the right shoulder.  Reports significant shoulder pain.  Did hit his head but denies headache.  Denies any numbness or tingling, weakness.  Denies any pain to the chest, abdomen.  Reports that he is on chronic oxycodone at home and is been taking this without significant improvement.  Denies any neck, back pain.   Past Medical History Past Medical History:  Diagnosis Date   Aortic aneurysm (HCC) 05/2017   Chronic back pain    Diabetes mellitus without complication (HCC)    Dyslipidemia    GERD (gastroesophageal reflux disease)    H/O hiatal hernia    Hypertension    Melanoma (HCC)    "back; left arm"   PONV (postoperative nausea and vomiting)    Rheumatoid arthritis (HCC)    Spasm of esophagus    Patient Active Problem List   Diagnosis Date Noted   FTT (failure to thrive) in adult 05/18/2023   Failure to thrive in adult 05/16/2023   History of pulmonary embolism 05/16/2023   Presence of internal carotid stent 01/28/2023   Primary adrenal insufficiency (HCC) 01/28/2023   Protein-calorie malnutrition, severe 01/21/2023   Excessive oral secretions 01/19/2023   PEG (percutaneous endoscopic gastrostomy) status (HCC) 01/17/2023   Long term current use of anticoagulant therapy 01/17/2023   Debility 01/17/2023   Advanced care planning/counseling discussion 01/17/2023   Antiplatelet or antithrombotic long-term use 01/15/2023   Anemia, posthemorrhagic, acute 01/15/2023   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/06/2023   Severe malnutrition (HCC)  12/30/2022   Sepsis without acute organ dysfunction (HCC) 12/28/2022   Influenza A 12/28/2022   History of CVA (cerebrovascular accident) 12/28/2022   Acute encephalopathy 12/27/2022   Acute esophagitis    Abdominal pain, epigastric    Closed left fibular fracture 08/22/2022   Reflux esophagitis 08/22/2022   Pressure injury of skin 08/20/2022   Upper GI bleed 08/19/2022   UGIB (upper gastrointestinal bleed) 08/18/2022   Pancytopenia (HCC)    Immunosuppression (HCC)    ILD (interstitial lung disease) (HCC)    Diarrhea    DIC (disseminated intravascular coagulation) (HCC)    AKI (acute kidney injury) (HCC)    COVID-19 01/26/2021   Rheumatoid arthritis (HCC) 07/22/2015   Mucositis due to chemotherapy 06/25/2015   Mucositis due to radiation therapy 06/25/2015   SBO (small bowel obstruction) (HCC) 05/29/2015   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   H/O drug therapy 05/12/2015   Abnormal mental state 04/22/2015   Fast heart beat 04/22/2015   Febrile 04/18/2015   Candida esophagitis (HCC) 04/16/2015   Dysphagia, pharyngoesophageal phase 04/16/2015   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   Fall 05/24/2014   Traumatic pneumothorax 05/23/2014   Diabetes mellitus, type 2 (HCC) 05/01/2014   Perirectal abscess 07/31/2013   Hemorrhoids 07/31/2013   Lentigines 11/13/2012   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 Authorizing Provider  abatacept (ORENCIA) 250 MG injection Inject 250 mg into the vein every 30 (thirty) days. Patient taking differently: Inject 750 mg into the vein every 30 (thirty) days. 02/12/21   Evlyn Kanner, MD  Acetaminophen (TYLENOL ARTHRITIS PAIN PO) Take 325 mg by mouth as needed (As needed).    [provider]  amLODipine (NORVASC) 5 MG tablet Take 1 tablet (5 mg total) by mouth daily. 05/23/23   Rhetta Mura, MD  atorvastatin (LIPITOR) 40 MG tablet Place 1 tablet (40 mg total) into feeding tube daily. 01/28/23   Marrianne Mood, MD  diclofenac Sodium (VOLTAREN) 1 % GEL Apply 2 g topically 4 (four) times daily.    [provider]  famotidine (PEPCID) 40 MG tablet Place 1 tablet (40 mg total) into feeding tube daily. 05/23/23   Rhetta Mura, MD  Feeding Supplies KIT 1 Box by Does not apply route daily. Patient will need pump and supplies for continuous tube feeds and 60mL syringes for free water flushes 12/31/22   Mapp, Gaylyn Cheers, MD  ferrous sulfate 300 (60 Fe) MG/5ML syrup Place 5 mLs (300 mg total) into feeding tube daily. 01/28/23   Marrianne Mood, MD  fludrocortisone (FLORINEF) 0.1 MG tablet Place 1 tablet (0.1 mg total) into feeding tube daily. 01/29/23   Marrianne Mood, MD  fluticasone (FLONASE) 50 MCG/ACT nasal spray Place 1 spray into both nostrils daily.    [provider]  folic acid (FOLVITE) 1 MG tablet Place 1 tablet (1 mg total) into feeding tube daily. 01/28/23   Marrianne Mood, MD  hydrocortisone (CORTEF) 10 MG tablet Place 1 tablet (10 mg total) into feeding tube daily at 2 PM. 01/28/23   Marrianne Mood, MD  hydrocortisone (CORTEF) 5 MG tablet Place 3 tablets (15 mg total) into feeding tube daily before breakfast. 01/28/23   Marrianne Mood, MD  levothyroxine (SYNTHROID) 50 MCG tablet Place 1 tablet (50 mcg total) into feeding tube daily. 01/28/23   Marrianne Mood, MD  loratadine (CLARITIN) 10 MG tablet Take 10 mg  by mouth daily.    [provider]  magnesium oxide (MAG-OX) 400 (240 Mg) MG tablet Place 1 tablet (400 mg total) into feeding tube daily. 01/28/23   Marrianne Mood, MD  metFORMIN (GLUCOPHAGE) 500 MG tablet Place 1 tablet (500 mg total) into feeding tube daily. 01/28/23   Marrianne Mood, MD  methotrexate 50 MG/2ML injection Inject 15 mg into the skin once a week. 04/25/23 04/17/30  [provider]  Nutritional Supplements (FEEDING SUPPLEMENT, OSMOLITE 1.5 CAL,) LIQD Place 1,530 mLs into feeding tube daily. Run for 18 hours from 1600 to 1000 01/28/23   Marrianne Mood, MD  omeprazole (PRILOSEC OTC) 20 MG tablet Take 2 tablets (40 mg total) by mouth daily. 05/23/23   Rhetta Mura, MD  Protein (FEEDING SUPPLEMENT, PROSOURCE TF20,) liquid Place 60 mLs into feeding tube daily. 05/23/23   Rhetta Mura, MD  sucralfate (CARAFATE) 1 GM/10ML suspension Place 10 mLs (1 g total) into feeding tube every 6 (six) hours. 01/28/23   Marrianne Mood, MD  Water For Irrigation, Sterile (FREE WATER) SOLN Place 200 mLs into feeding tube 6 (six) times daily. 05/23/23   Rhetta Mura, MD  Past Surgical History Past Surgical History:  Procedure Laterality Date   aneursym repair     aneursym repair and stent placement  05/2017   BIOPSY  08/19/2022   Procedure: BIOPSY;  Surgeon: Meryl Dare, MD;  Location: [facility] ENDOSCOPY;  Service: Gastroenterology;;   CARPAL TUNNEL RELEASE Bilateral    COLON SURGERY  2007   removed 18" of colon   ERCP W/ METAL STENT PLACEMENT  11/2005   Hattie Perch 12/14/2005   ESOPHAGOGASTRODUODENOSCOPY (EGD) WITH ESOPHAGEAL DILATION     "several times'   ESOPHAGOGASTRODUODENOSCOPY (EGD) WITH PROPOFOL N/A 08/19/2022   Procedure: ESOPHAGOGASTRODUODENOSCOPY (EGD) WITH PROPOFOL;  Surgeon: Meryl Dare, MD;  Location: [facility] ENDOSCOPY;   Service: Gastroenterology;  Laterality: N/A;   ESOPHAGOGASTRODUODENOSCOPY (EGD) WITH PROPOFOL N/A 01/15/2023   Procedure: ESOPHAGOGASTRODUODENOSCOPY (EGD) WITH PROPOFOL;  Surgeon: Benancio Deeds, MD;  Location: [facility] ENDOSCOPY;  Service: Gastroenterology;  Laterality: N/A;   ESOPHAGOGASTRODUODENOSCOPY (EGD) WITH PROPOFOL N/A 01/20/2023   Procedure: ESOPHAGOGASTRODUODENOSCOPY (EGD) WITH PROPOFOL;  Surgeon: Napoleon Form, MD;  Location: [facility] ENDOSCOPY;  Service: Gastroenterology;  Laterality: N/A;   HAND SURGERY Bilateral    "plastic knuckles"   IR ANGIO INTRA EXTRACRAN SEL COM CAROTID INNOMINATE UNI R MOD SED  01/03/2023   IR ANGIO VERTEBRAL SEL SUBCLAVIAN INNOMINATE UNI L MOD SED  01/03/2023   IR ANGIO VERTEBRAL SEL VERTEBRAL UNI R MOD SED  01/03/2023   IR INTRAVSC STENT CERV CAROTID W/EMB-PROT MOD SED INCL ANGIO  01/03/2023   IR REPLC GASTRO/COLONIC TUBE PERCUT W/FLUORO  01/21/2023   IR US GUIDE VASC ACCESS RIGHT  01/03/2023   LAPAROSCOPIC CHOLECYSTECTOMY  01/2006   MELANOMA EXCISION     "back; left arm"   NASAL SEPTUM SURGERY     PELVIC FRACTURE SURGERY  1964   "busted in 2 places"   RADIOLOGY WITH ANESTHESIA N/A 01/03/2023   Procedure: IR WITH ANESTHESIA;  Surgeon: Radiologist, Medication, MD;  Location: [facility] OR;  Service: Radiology;  Laterality: N/A;   RESECTION DISTAL CLAVICAL Right 05/13/2016   Procedure: DISTAL CLAVICLE EXCISION;  Surgeon: Loreta Ave, MD;  Location: [facility];  Service: Orthopedics;  Laterality: Right;   SHOULDER ARTHROSCOPY WITH ROTATOR CUFF REPAIR AND SUBACROMIAL DECOMPRESSION Right 05/13/2016   Procedure: RIGHT SHOULDER SCOPE DEBRIDEMENT, ACROMIOPLASTY, ROTATOR CUFF REPAIR; RELEASE BICEPS TENDON AND DEBRIDEMENT LABRUM;  Surgeon: Loreta Ave, MD;  Location: [facility];  Service: Orthopedics;  Laterality: Right;   TIBIA FRACTURE SURGERY Left 2006   "put a steel rod in it"   Family History Family History  Problem Relation Age of  Onset   Heart disease Mother    Heart disease Father    Cancer Maternal Grandmother     Social History Social History   Tobacco Use   Smoking status: Former    Current packs/day: 0.00    Average packs/day: 1 pack/day for 46.0 years (46.0 ttl pk-yrs)    Types: Cigarettes    Start date: 11/27/1959    Quit date: 11/26/2005    Years since quitting: 18.2   Smokeless tobacco: Never  Substance Use Topics   Alcohol use: Yes    Alcohol/week: 9.0 standard drinks of alcohol    Types: 9 Shots of liquor per week    Comment: 05/23/2014 "3, 1 shot drinks maybe 3 times/wk"   Drug use: No   Allergies Patient has no known allergies.  Review of Systems Review of Systems  All other systems reviewed and are negative.   Physical Exam Vital  Signs  I have reviewed the triage vital signs BP 112/71 (BP Location: Left Arm)   Pulse 70   Temp 97.7 F (36.5 C) (Oral)   Resp 20   SpO2 97%  Physical Exam Vitals and nursing note reviewed.  Constitutional:      General: He is not in acute distress.    Appearance: Normal appearance.  HENT:     Head: Normocephalic and atraumatic.     Mouth/Throat:     Mouth: Mucous membranes are moist.  Eyes:     Conjunctiva/sclera: Conjunctivae normal.  Cardiovascular:     Rate and Rhythm: Normal rate and regular rhythm.  Pulmonary:     Effort: Pulmonary effort is normal. No respiratory distress.     Breath sounds: Normal breath sounds.  Abdominal:     General: Abdomen is flat.     Palpations: Abdomen is soft.     Tenderness: There is no abdominal tenderness.  Musculoskeletal:     Right lower leg: No edema.     Left lower leg: No edema.     Comments: Significant swelling around the right shoulder with severe pain with any range of motion, no tenderness around the right elbow, wrist, distal pulses intact bilateral upper extremities.  Left upper extremity atraumatic.  Bilateral lower extremities with no signs of trauma.  No midline C, T, L-spine tenderness.   Skin:    General: Skin is warm and dry.     Capillary Refill: Capillary refill takes less than 2 seconds.  Neurological:     Mental Status: He is alert and oriented to person, place, and time. Mental status is at baseline.  Psychiatric:        Mood and Affect: Mood normal.        Behavior: Behavior normal.     ED Results and Treatments Labs (all labs ordered are listed, but only abnormal results are displayed) Labs Reviewed  COMPREHENSIVE METABOLIC PANEL - Abnormal; Notable for the following components:      Result Value   Glucose, Bld 197 (*)    BUN 29 (*)    Total Protein 6.2 (*)    Albumin 3.1 (*)    All other components within normal limits  CBC WITH DIFFERENTIAL/PLATELET - Abnormal; Notable for the following components:   WBC 13.0 (*)    RBC 3.57 (*)    Hemoglobin 10.1 (*)    HCT 31.9 (*)    RDW 17.8 (*)    Neutro Abs 11.6 (*)    Lymphs Abs 0.3 (*)    Monocytes Absolute 1.1 (*)    All other components within normal limits                                                                                                                          Radiology CT Head Wo Contrast Result Date: 02/14/2024 CLINICAL DATA:  Head trauma, minor (Age >= 65y); Neck trauma (Age >= 65y) EXAM: CT HEAD WITHOUT CONTRAST CT CERVICAL  SPINE WITHOUT CONTRAST TECHNIQUE: Multidetector CT imaging of the head and cervical spine was performed following the standard protocol without intravenous contrast. Multiplanar CT image reconstructions of the cervical spine were also generated. RADIATION DOSE REDUCTION: This exam was performed according to the departmental dose-optimization program which includes automated exposure control, adjustment of the mA and/or kV according to patient size and/or use of iterative reconstruction technique. COMPARISON:  None Available. FINDINGS: CT HEAD FINDINGS Brain: Cerebral ventricle sizes are concordant with the degree of cerebral volume loss. Patchy and confluent areas of  decreased attenuation are noted throughout the deep and periventricular white matter of the cerebral hemispheres bilaterally, compatible with chronic microvascular ischemic disease. No evidence of large-territorial acute infarction. No parenchymal hemorrhage. No mass lesion. No extra-axial collection. No mass effect or midline shift. No hydrocephalus. Basilar cisterns are patent. Vascular: No hyperdense vessel. Atherosclerotic calcifications are present within the cavernous internal carotid and vertebral arteries. Skull: No acute fracture or focal lesion. Sinuses/Orbits: Paranasal sinuses and mastoid air cells are clear. The orbits are unremarkable. Other: None. CT CERVICAL SPINE FINDINGS Alignment: Grade 1 anterolisthesis of C4 on C5. Skull base and vertebrae: Multilevel moderate severe degenerative changes of the spine. Associated severe osseous neural foraminal stenosis at the right C3-C4, C5-C6 levels. No severe osseous neural central canal stenosis. No acute fracture. No aggressive appearing focal osseous lesion or focal pathologic process. Soft tissues and spinal canal: No prevertebral fluid or swelling. No visible canal hematoma. Upper chest: Biapical pleural/pulmonary scarring. Emphysematous changes. Other: Atherosclerotic plaque of the carotid arteries within the neck. IMPRESSION: 1. No acute intracranial abnormality. 2. No acute displaced fracture or traumatic listhesis of the cervical spine. 3. Multilevel moderate severe degenerative changes of the spine. Associated severe osseous neural foraminal stenosis at the right C3-C4, C5-C6 levels. Electronically Signed   By: Tish Frederickson M.D.   On: 02/14/2024 21:42   CT Cervical Spine Wo Contrast Result Date: 02/14/2024 CLINICAL DATA:  Head trauma, minor (Age >= 65y); Neck trauma (Age >= 65y) EXAM: CT HEAD WITHOUT CONTRAST CT CERVICAL SPINE WITHOUT CONTRAST TECHNIQUE: Multidetector CT imaging of the head and cervical spine was performed following the  standard protocol without intravenous contrast. Multiplanar CT image reconstructions of the cervical spine were also generated. RADIATION DOSE REDUCTION: This exam was performed according to the departmental dose-optimization program which includes automated exposure control, adjustment of the mA and/or kV according to patient size and/or use of iterative reconstruction technique. COMPARISON:  None Available. FINDINGS: CT HEAD FINDINGS Brain: Cerebral ventricle sizes are concordant with the degree of cerebral volume loss. Patchy and confluent areas of decreased attenuation are noted throughout the deep and periventricular white matter of the cerebral hemispheres bilaterally, compatible with chronic microvascular ischemic disease. No evidence of large-territorial acute infarction. No parenchymal hemorrhage. No mass lesion. No extra-axial collection. No mass effect or midline shift. No hydrocephalus. Basilar cisterns are patent. Vascular: No hyperdense vessel. Atherosclerotic calcifications are present within the cavernous internal carotid and vertebral arteries. Skull: No acute fracture or focal lesion. Sinuses/Orbits: Paranasal sinuses and mastoid air cells are clear. The orbits are unremarkable. Other: None. CT CERVICAL SPINE FINDINGS Alignment: Grade 1 anterolisthesis of C4 on C5. Skull base and vertebrae: Multilevel moderate severe degenerative changes of the spine. Associated severe osseous neural foraminal stenosis at the right C3-C4, C5-C6 levels. No severe osseous neural central canal stenosis. No acute fracture. No aggressive appearing focal osseous lesion or focal pathologic process. Soft tissues and spinal canal: No prevertebral fluid or swelling. No  visible canal hematoma. Upper chest: Biapical pleural/pulmonary scarring. Emphysematous changes. Other: Atherosclerotic plaque of the carotid arteries within the neck. IMPRESSION: 1. No acute intracranial abnormality. 2. No acute displaced fracture or  traumatic listhesis of the cervical spine. 3. Multilevel moderate severe degenerative changes of the spine. Associated severe osseous neural foraminal stenosis at the right C3-C4, C5-C6 levels. Electronically Signed   By: Tish Frederickson M.D.   On: 02/14/2024 21:42   DG Chest Port 1 View Result Date: 02/14/2024 CLINICAL DATA:  Fall. EXAM: PORTABLE CHEST 1 VIEW COMPARISON:  05/16/2023 FINDINGS: Stable heart size and mediastinal contours. Mild chronic elevation of right hemidiaphragm. Bibasilar atelectasis without confluent airspace disease. No pleural fluid or pneumothorax. Mild chronic rib deformity at the left lung apex. IMPRESSION: Bibasilar atelectasis. Electronically Signed   By: Narda Rutherford M.D.   On: 02/14/2024 20:49   DG Shoulder Right Result Date: 02/14/2024 CLINICAL DATA:  Pain after fall. EXAM: RIGHT SHOULDER - 2+ VIEW COMPARISON:  None Available. FINDINGS: Comminuted and displaced proximal humerus fracture. The dominant fracture plane is through the anatomic neck. There is mild apex medial angulation. Glenohumeral dislocation. Acromioclavicular alignment is normal. Generalized soft tissue edema. IMPRESSION: Comminuted displaced proximal humerus fracture. Electronically Signed   By: Narda Rutherford M.D.   On: 02/14/2024 20:46    Pertinent labs & imaging results that were available during my care of the patient were reviewed by me and considered in my medical decision making (see MDM for details).  Medications Ordered in ED Medications  HYDROmorphone (DILAUDID) injection 1 mg (1 mg Intravenous Given 02/14/24 2022)  HYDROmorphone (DILAUDID) injection 1 mg (1 mg Intravenous Given 02/15/24 0003)                                                                                                                                     Procedures Procedures  (including critical care time)  Medical Decision Making / ED Course   MDM:  78 year old presenting to the emergency department with  injury.  On exam, he is swelling to the right shoulder.  Also reports head injury without evidence of trauma on exam.  X-ray shows proximal humeral fracture with possible dislocation,.  Discussed with Dr. Carola Frost, recommends obtaining CT of the shoulder to evaluate for dislocation.  Recommends medicine admission.  He will consult.  Patient is having significant uncontrolled pain, requiring IV pain medication, will need admission for this as well.  Discussed with Dr. Loney Loh who will admit the patient.  "Patient also endorses head injury, CT head and CT cervical spine obtained without evidence of underlying head injury.  On exam, he does not have any other signs of traumatic injury.  will be admitted for pain control      Additional history obtained: -Additional history obtained from ems -External records from outside source obtained and reviewed including: Chart review including previous notes, labs, imaging, consultation notes including prior notes  Lab Tests: -I ordered, reviewed, and interpreted labs.   The pertinent results include:   Labs Reviewed  COMPREHENSIVE METABOLIC PANEL - Abnormal; Notable for the following components:      Result Value   Glucose, Bld 197 (*)    BUN 29 (*)    Total Protein 6.2 (*)    Albumin 3.1 (*)    All other components within normal limits  CBC WITH DIFFERENTIAL/PLATELET - Abnormal; Notable for the following components:   WBC 13.0 (*)    RBC 3.57 (*)    Hemoglobin 10.1 (*)    HCT 31.9 (*)    RDW 17.8 (*)    Neutro Abs 11.6 (*)    Lymphs Abs 0.3 (*)    Monocytes Absolute 1.1 (*)    All other components within normal limits    Notable for mild hyperglycemia, leukocytosis   Imaging Studies ordered: I ordered imaging studies including XR shoulder  On my interpretation imaging demonstrates fracture w/ possible dislocation I independently visualized and interpreted imaging. I agree with the radiologist interpretation   Medicines ordered and  prescription drug management: Meds ordered this encounter  Medications   DISCONTD: oxyCODONE (Oxy IR/ROXICODONE) immediate release tablet 10 mg    Refill:  0   HYDROmorphone (DILAUDID) injection 1 mg   HYDROmorphone (DILAUDID) injection 1 mg    -I have reviewed the patients home medicines and have made adjustments as needed   Consultations Obtained: I requested consultation with the orthopedic surgeon,  and discussed lab and imaging findings as well as pertinent plan - they recommend: medicine admission, CT shoulder    Cardiac Monitoring: The patient was maintained on a cardiac monitor.  I personally viewed and interpreted the cardiac monitored which showed an underlying rhythm of: NSR   Reevaluation: After the interventions noted above, I reevaluated the patient and found that their symptoms have improved  Co morbidities that complicate the patient evaluation  Past Medical History:  Diagnosis Date   Aortic aneurysm (HCC) 05/2017   Chronic back pain    Diabetes mellitus without complication (HCC)    Dyslipidemia    GERD (gastroesophageal reflux disease)    H/O hiatal hernia    Hypertension    Melanoma (HCC)    "back; left arm"   PONV (postoperative nausea and vomiting)    Rheumatoid arthritis (HCC)    Spasm of esophagus       Dispostion: Disposition decision including need for hospitalization was considered, and patient admitted to the hospital.    Final Clinical Impression(s) / ED Diagnoses Final diagnoses:  Fall, initial encounter  Fracture of bone     This chart was dictated using voice recognition software.  Despite best efforts to proofread,  errors can occur which can change the documentation meaning.    Lonell Grandchild, MD 02/15/24 [phone]

## 2024-02-15 NOTE — Consult Note (Signed)
 Reason for Consult:Right humerus fx Referring Physician: Noralee Stain Time called: [id number] Time at bedside: 0930   [name] is an 78 y.o. male.  HPI: [name] tripped over a dog at home and fell. He had immediate right shoulder pain. He was brought to the ED where x-rays showed a humeral head fx and orthopedic surgery was consulted. He is RHD and lives at home with ex-wife and uses a RW to ambulate.  Past Medical History:  Diagnosis Date   Aortic aneurysm (HCC) 05/2017   Chronic back pain    Diabetes mellitus without complication (HCC)    Dyslipidemia    GERD (gastroesophageal reflux disease)    H/O hiatal hernia    Hypertension    Melanoma (HCC)    "back; left arm"   PONV (postoperative nausea and vomiting)    Rheumatoid arthritis (HCC)    Spasm of esophagus     Past Surgical History:  Procedure Laterality Date   aneursym repair     aneursym repair and stent placement  05/2017   BIOPSY  08/19/2022   Procedure: BIOPSY;  Surgeon: Meryl Dare, MD;  Location: [facility] ENDOSCOPY;  Service: Gastroenterology;;   CARPAL TUNNEL RELEASE Bilateral    COLON SURGERY  2007   removed 18" of colon   ERCP W/ METAL STENT PLACEMENT  11/2005   Hattie Perch 12/14/2005   ESOPHAGOGASTRODUODENOSCOPY (EGD) WITH ESOPHAGEAL DILATION     "several times'   ESOPHAGOGASTRODUODENOSCOPY (EGD) WITH PROPOFOL N/A 08/19/2022   Procedure: ESOPHAGOGASTRODUODENOSCOPY (EGD) WITH PROPOFOL;  Surgeon: Meryl Dare, MD;  Location: [facility] ENDOSCOPY;  Service: Gastroenterology;  Laterality: N/A;   ESOPHAGOGASTRODUODENOSCOPY (EGD) WITH PROPOFOL N/A 01/15/2023   Procedure: ESOPHAGOGASTRODUODENOSCOPY (EGD) WITH PROPOFOL;  Surgeon: Benancio Deeds, MD;  Location: [facility] ENDOSCOPY;  Service: Gastroenterology;  Laterality: N/A;   ESOPHAGOGASTRODUODENOSCOPY (EGD) WITH PROPOFOL N/A 01/20/2023   Procedure: ESOPHAGOGASTRODUODENOSCOPY (EGD) WITH PROPOFOL;  Surgeon: Napoleon Form, MD;  Location: [facility] ENDOSCOPY;  Service:  Gastroenterology;  Laterality: N/A;   HAND SURGERY Bilateral    "plastic knuckles"   IR ANGIO INTRA EXTRACRAN SEL COM CAROTID INNOMINATE UNI R MOD SED  01/03/2023   IR ANGIO VERTEBRAL SEL SUBCLAVIAN INNOMINATE UNI L MOD SED  01/03/2023   IR ANGIO VERTEBRAL SEL VERTEBRAL UNI R MOD SED  01/03/2023   IR INTRAVSC STENT CERV CAROTID W/EMB-PROT MOD SED INCL ANGIO  01/03/2023   IR REPLC GASTRO/COLONIC TUBE PERCUT W/FLUORO  01/21/2023   IR US GUIDE VASC ACCESS RIGHT  01/03/2023   LAPAROSCOPIC CHOLECYSTECTOMY  01/2006   MELANOMA EXCISION     "back; left arm"   NASAL SEPTUM SURGERY     PELVIC FRACTURE SURGERY  1964   "busted in 2 places"   RADIOLOGY WITH ANESTHESIA N/A 01/03/2023   Procedure: IR WITH ANESTHESIA;  Surgeon: Radiologist, Medication, MD;  Location: [facility] OR;  Service: Radiology;  Laterality: N/A;   RESECTION DISTAL CLAVICAL Right 05/13/2016   Procedure: DISTAL CLAVICLE EXCISION;  Surgeon: Loreta Ave, MD;  Location: [facility];  Service: Orthopedics;  Laterality: Right;   SHOULDER ARTHROSCOPY WITH ROTATOR CUFF REPAIR AND SUBACROMIAL DECOMPRESSION Right 05/13/2016   Procedure: RIGHT SHOULDER SCOPE DEBRIDEMENT, ACROMIOPLASTY, ROTATOR CUFF REPAIR; RELEASE BICEPS TENDON AND DEBRIDEMENT LABRUM;  Surgeon: Loreta Ave, MD;  Location: [facility];  Service: Orthopedics;  Laterality: Right;   TIBIA FRACTURE SURGERY Left 2006   "put a steel rod in it"    Family History  Problem Relation Age of Onset   Heart  disease Mother    Heart disease Father    Cancer Maternal Grandmother     Social History:  reports that he quit smoking about 18 years ago. His smoking use included cigarettes. He started smoking about 64 years ago. He has a 46 pack-year smoking history. He has never used smokeless tobacco. He reports current alcohol use of about 9.0 standard drinks of alcohol per week. He reports that he does not use drugs.  Allergies: No Known Allergies  Medications: I have  reviewed the patient's current medications.  Results for orders placed or performed during the hospital encounter of 02/14/24 (from the past 48 hours)  Comprehensive metabolic panel     Status: Abnormal   Collection Time: 02/14/24  8:16 PM  Result Value Ref Range   Sodium 140 135 - 145 mmol/L   Potassium 4.7 3.5 - 5.1 mmol/L   Chloride 100 98 - 111 mmol/L   CO2 27 22 - 32 mmol/L   Glucose, Bld 197 (H) 70 - 99 mg/dL    Comment: Glucose reference range applies only to samples taken after fasting for at least 8 hours.   BUN 29 (H) 8 - 23 mg/dL   Creatinine, Ser 1.61 0.61 - 1.24 mg/dL   Calcium 9.0 8.9 - 09.6 mg/dL   Total Protein 6.2 (L) 6.5 - 8.1 g/dL   Albumin 3.1 (L) 3.5 - 5.0 g/dL   AST 25 15 - 41 U/L   ALT 18 0 - 44 U/L   Alkaline Phosphatase 83 38 - 126 U/L   Total Bilirubin 0.6 0.0 - 1.2 mg/dL   GFR, Estimated >04 >54 mL/min    Comment: (NOTE) Calculated using the CKD-EPI Creatinine Equation (2021)    Anion gap 13 5 - 15    Comment: Performed at [facility], [address], [place], [place] [address]  CBC with Differential     Status: Abnormal   Collection Time: 02/14/24  8:16 PM  Result Value Ref Range   WBC 13.0 (H) 4.0 - 10.5 K/uL   RBC 3.57 (L) 4.22 - 5.81 MIL/uL   Hemoglobin 10.1 (L) 13.0 - 17.0 g/dL   HCT 91.4 (L) 78.2 - 95.6 %   MCV 89.4 80.0 - 100.0 fL   MCH 28.3 26.0 - 34.0 pg   MCHC 31.7 30.0 - 36.0 g/dL   RDW 21.3 (H) 08.6 - 57.8 %   Platelets 185 150 - 400 K/uL   nRBC 0.0 0.0 - 0.2 %   Neutrophils Relative % 89 %   Neutro Abs 11.6 (H) 1.7 - 7.7 K/uL   Lymphocytes Relative 2 %   Lymphs Abs 0.3 (L) 0.7 - 4.0 K/uL   Monocytes Relative 8 %   Monocytes Absolute 1.1 (H) 0.1 - 1.0 K/uL   Eosinophils Relative 0 %   Eosinophils Absolute 0.0 0.0 - 0.5 K/uL   Basophils Relative 0 %   Basophils Absolute 0.0 0.0 - 0.1 K/uL   Immature Granulocytes 1 %   Abs Immature Granulocytes 0.06 0.00 - 0.07 K/uL    Comment: Performed at [facility], [address], [place], [place] [address]  CBG monitoring, ED     Status: Abnormal   Collection Time: 02/15/24  6:10 AM  Result Value Ref Range   Glucose-Capillary 165 (H) 70 - 99 mg/dL    Comment: Glucose reference range applies only to samples taken after fasting for at least 8 hours.  Hemoglobin A1c     Status: Abnormal  Collection Time: 02/15/24  6:24 AM  Result Value Ref Range   Hgb A1c MFr Bld 7.2 (H) 4.8 - 5.6 %    Comment: (NOTE) Pre diabetes:          5.7%-6.4%  Diabetes:              >6.4%  Glycemic control for   <7.0% adults with diabetes    Mean Plasma Glucose 159.94 mg/dL    Comment: Performed at [facility], [address], [place], [place] [address]  CBG monitoring, ED     Status: Abnormal   Collection Time: 02/15/24  8:03 AM  Result Value Ref Range   Glucose-Capillary 171 (H) 70 - 99 mg/dL    Comment: Glucose reference range applies only to samples taken after fasting for at least 8 hours.  CBG monitoring, ED     Status: Abnormal   Collection Time: 02/15/24  8:24 AM  Result Value Ref Range   Glucose-Capillary 162 (H) 70 - 99 mg/dL    Comment: Glucose reference range applies only to samples taken after fasting for at least 8 hours.   Comment 1 Notify RN    Comment 2 Document in Chart     CT Shoulder Right Wo Contrast Result Date: 02/15/2024 CLINICAL DATA:  Right shoulder injury, fracture EXAM: CT OF THE UPPER RIGHT EXTREMITY WITHOUT CONTRAST TECHNIQUE: Multidetector CT imaging of the upper right extremity was performed according to the standard protocol. RADIATION DOSE REDUCTION: This exam was performed according to the departmental dose-optimization program which includes automated exposure control, adjustment of the mA and/or kV according to patient size and/or use of iterative reconstruction technique. COMPARISON:  None Available. FINDINGS: Bones/Joint/Cartilage There is a comminuted, impacted fracture of the right humeral head with an oblique fracture involving  the surgical neck of the humerus with lateral angulation and impaction with 2.5 cm override of the fracture fragments. The articular surface of the humeral head appears externally rotated but is still seated within the glenoid fossa. Greater and lesser tuberosities appear intact. Scapula and visualized clavicle are intact. Acromioclavicular joint space is preserved. Respiratory motion artifact slightly limits evaluation of the thoracic cage,, the visualized osseous structures appear intact. Ligaments Suboptimally assessed by CT. Muscles and Tendons There is heterogeneous attenuation and focal thinning of the supraspinatus tendon best seen on coronal image # 45/6 and disruption of this tendon is not excluded though not optimally visualized on this examination. Infraspinatus and subscapularis tendons appear intact. There is marked thickening and hyperdensity the deltoid and to a lesser extent the visualized triceps and peripheral left latissimus dorsi musculature in keeping with intramuscular hemorrhage. Soft tissues Right shoulder effusion is present. Advanced vascular calcifications are seen within the central right upper extremity arterial vasculature. Extensive subcutaneous edema within the visualized right upper extremity. IMPRESSION: 1. Comminuted, impacted fracture of the right humeral head with an oblique fracture involving the surgical neck of the humerus with lateral angulation and impaction with 2.5 cm override of the fracture fragments. The articular surface of the humeral head appears externally rotated but is still seated within the glenoid fossa. 2. Heterogeneous attenuation and focal thinning of the supraspinatus tendon. Disruption of this tendon is not excluded though not optimally visualized on this examination. This could be better assessed with MRI examination. 3. Marked thickening and hyperdensity the deltoid and to a lesser extent the visualized triceps and peripheral left latissimus dorsi  musculature in keeping with intramuscular hemorrhage. 4. Right shoulder effusion. 5. Extensive subcutaneous edema  within the visualized right upper extremity. 6. Advanced vascular calcifications within the central right upper extremity arterial vasculature. Electronically Signed   By: Helyn Numbers M.D.   On: 02/15/2024 00:18   CT Head Wo Contrast Result Date: 02/14/2024 CLINICAL DATA:  Head trauma, minor (Age >= 65y); Neck trauma (Age >= 65y) EXAM: CT HEAD WITHOUT CONTRAST CT CERVICAL SPINE WITHOUT CONTRAST TECHNIQUE: Multidetector CT imaging of the head and cervical spine was performed following the standard protocol without intravenous contrast. Multiplanar CT image reconstructions of the cervical spine were also generated. RADIATION DOSE REDUCTION: This exam was performed according to the departmental dose-optimization program which includes automated exposure control, adjustment of the mA and/or kV according to patient size and/or use of iterative reconstruction technique. COMPARISON:  None Available. FINDINGS: CT HEAD FINDINGS Brain: Cerebral ventricle sizes are concordant with the degree of cerebral volume loss. Patchy and confluent areas of decreased attenuation are noted throughout the deep and periventricular white matter of the cerebral hemispheres bilaterally, compatible with chronic microvascular ischemic disease. No evidence of large-territorial acute infarction. No parenchymal hemorrhage. No mass lesion. No extra-axial collection. No mass effect or midline shift. No hydrocephalus. Basilar cisterns are patent. Vascular: No hyperdense vessel. Atherosclerotic calcifications are present within the cavernous internal carotid and vertebral arteries. Skull: No acute fracture or focal lesion. Sinuses/Orbits: Paranasal sinuses and mastoid air cells are clear. The orbits are unremarkable. Other: None. CT CERVICAL SPINE FINDINGS Alignment: Grade 1 anterolisthesis of C4 on C5. Skull base and vertebrae:  Multilevel moderate severe degenerative changes of the spine. Associated severe osseous neural foraminal stenosis at the right C3-C4, C5-C6 levels. No severe osseous neural central canal stenosis. No acute fracture. No aggressive appearing focal osseous lesion or focal pathologic process. Soft tissues and spinal canal: No prevertebral fluid or swelling. No visible canal hematoma. Upper chest: Biapical pleural/pulmonary scarring. Emphysematous changes. Other: Atherosclerotic plaque of the carotid arteries within the neck. IMPRESSION: 1. No acute intracranial abnormality. 2. No acute displaced fracture or traumatic listhesis of the cervical spine. 3. Multilevel moderate severe degenerative changes of the spine. Associated severe osseous neural foraminal stenosis at the right C3-C4, C5-C6 levels. Electronically Signed   By: Tish Frederickson M.D.   On: 02/14/2024 21:42   CT Cervical Spine Wo Contrast Result Date: 02/14/2024 CLINICAL DATA:  Head trauma, minor (Age >= 65y); Neck trauma (Age >= 65y) EXAM: CT HEAD WITHOUT CONTRAST CT CERVICAL SPINE WITHOUT CONTRAST TECHNIQUE: Multidetector CT imaging of the head and cervical spine was performed following the standard protocol without intravenous contrast. Multiplanar CT image reconstructions of the cervical spine were also generated. RADIATION DOSE REDUCTION: This exam was performed according to the departmental dose-optimization program which includes automated exposure control, adjustment of the mA and/or kV according to patient size and/or use of iterative reconstruction technique. COMPARISON:  None Available. FINDINGS: CT HEAD FINDINGS Brain: Cerebral ventricle sizes are concordant with the degree of cerebral volume loss. Patchy and confluent areas of decreased attenuation are noted throughout the deep and periventricular white matter of the cerebral hemispheres bilaterally, compatible with chronic microvascular ischemic disease. No evidence of large-territorial acute  infarction. No parenchymal hemorrhage. No mass lesion. No extra-axial collection. No mass effect or midline shift. No hydrocephalus. Basilar cisterns are patent. Vascular: No hyperdense vessel. Atherosclerotic calcifications are present within the cavernous internal carotid and vertebral arteries. Skull: No acute fracture or focal lesion. Sinuses/Orbits: Paranasal sinuses and mastoid air cells are clear. The orbits are unremarkable. Other: None. CT CERVICAL SPINE FINDINGS Alignment:  Grade 1 anterolisthesis of C4 on C5. Skull base and vertebrae: Multilevel moderate severe degenerative changes of the spine. Associated severe osseous neural foraminal stenosis at the right C3-C4, C5-C6 levels. No severe osseous neural central canal stenosis. No acute fracture. No aggressive appearing focal osseous lesion or focal pathologic process. Soft tissues and spinal canal: No prevertebral fluid or swelling. No visible canal hematoma. Upper chest: Biapical pleural/pulmonary scarring. Emphysematous changes. Other: Atherosclerotic plaque of the carotid arteries within the neck. IMPRESSION: 1. No acute intracranial abnormality. 2. No acute displaced fracture or traumatic listhesis of the cervical spine. 3. Multilevel moderate severe degenerative changes of the spine. Associated severe osseous neural foraminal stenosis at the right C3-C4, C5-C6 levels. Electronically Signed   By: Tish Frederickson M.D.   On: 02/14/2024 21:42   DG Chest Port 1 View Result Date: 02/14/2024 CLINICAL DATA:  Fall. EXAM: PORTABLE CHEST 1 VIEW COMPARISON:  05/16/2023 FINDINGS: Stable heart size and mediastinal contours. Mild chronic elevation of right hemidiaphragm. Bibasilar atelectasis without confluent airspace disease. No pleural fluid or pneumothorax. Mild chronic rib deformity at the left lung apex. IMPRESSION: Bibasilar atelectasis. Electronically Signed   By: Narda Rutherford M.D.   On: 02/14/2024 20:49   DG Shoulder Right Result Date:  02/14/2024 CLINICAL DATA:  Pain after fall. EXAM: RIGHT SHOULDER - 2+ VIEW COMPARISON:  None Available. FINDINGS: Comminuted and displaced proximal humerus fracture. The dominant fracture plane is through the anatomic neck. There is mild apex medial angulation. Glenohumeral dislocation. Acromioclavicular alignment is normal. Generalized soft tissue edema. IMPRESSION: Comminuted displaced proximal humerus fracture. Electronically Signed   By: Narda Rutherford M.D.   On: 02/14/2024 20:46    Review of Systems  HENT:  Negative for ear discharge, ear pain, hearing loss and tinnitus.   Eyes:  Negative for photophobia and pain.  Respiratory:  Negative for cough and shortness of breath.   Cardiovascular:  Negative for chest pain.  Gastrointestinal:  Negative for abdominal pain, nausea and vomiting.  Genitourinary:  Negative for dysuria, flank pain, frequency and urgency.  Musculoskeletal:  Positive for arthralgias (Right shoulder). Negative for back pain, myalgias and neck pain.  Neurological:  Negative for dizziness and headaches.  Hematological:  Does not bruise/bleed easily.  Psychiatric/Behavioral:  The patient is not nervous/anxious.    Blood pressure (!) 109/52, pulse 66, temperature 97.6 F (36.4 C), temperature source Oral, resp. rate 18, SpO2 99%. Physical Exam Constitutional:      General: He is not in acute distress.    Appearance: He is well-developed. He is not diaphoretic.  HENT:     Head: Normocephalic and atraumatic.  Eyes:     General: No scleral icterus.       Right eye: No discharge.        Left eye: No discharge.     Conjunctiva/sclera: Conjunctivae normal.  Cardiovascular:     Rate and Rhythm: Normal rate and regular rhythm.  Pulmonary:     Effort: Pulmonary effort is normal. No respiratory distress.  Musculoskeletal:     Cervical back: Normal range of motion.     Comments: Right shoulder, elbow, wrist, digits- no skin wounds, severe TTP shoulder, extensive ecchymosis  anterior shoulder, significant hand deformity 2/2 RA, no instability, no blocks to motion  Sens  Ax/R/M/U intact  Mot   Ax/ R/ PIN/ M/ AIN/ U grossly intact  Rad 2+  Skin:    General: Skin is warm and dry.  Neurological:     Mental Status: He is alert.  Psychiatric:        Mood and Affect: Mood normal.        Behavior: Behavior normal.     Assessment/Plan: Right humerus fx -- Plan non-operative management with sling and NWB. F/u with Dr. Carola Frost in 2 weeks.    Freeman Caldron, PA-C Orthopedic Surgery [phone] 02/15/2024, 9:37 AM

## 2024-02-15 NOTE — ED Notes (Signed)
ED TO INPATIENT HANDOFF REPORT  ED Nurse Name and Phone #: Delice Bison. RN  S Name/Age/Gender [name] 79 y.o. male Room/Bed: H015C/H015C  Code Status   Code Status: Full Code  Home/SNF/Other Home Patient oriented to: self and place Is this baseline? Yes   Triage Complete: Triage complete  Chief Complaint Humerus fracture [S42.309A]  Triage Note Tripped over dog at home at 0800. Hit head, not complaining of any headache. Complains of right shoulder pain. Bruising and swelling noted. No blood thinners. Alert and oriented x4. Been taking PO oxycodone 1 hr PTA for pain.      Allergies No Known Allergies  Level of Care/Admitting Diagnosis ED Disposition     ED Disposition  Admit   Condition  --   Comment  Hospital Area: [facility] [[id number]]  Level of Care: Med-Surg [16]  May admit patient to [facility] or [facility] if equivalent level of care is available:: Yes  Covid Evaluation: Asymptomatic - no recent exposure (last 10 days) testing not required  Diagnosis: Humerus fracture [290148]  Admitting Physician: John Giovanni [[id number]]  Attending Physician: John Giovanni [[id number]]  Certification:: I certify this patient will need inpatient services for at least 2 midnights  Expected Medical Readiness: 02/18/2024          B Medical/Surgery History Past Medical History:  Diagnosis Date   Aortic aneurysm (HCC) 05/2017   Chronic back pain    Diabetes mellitus without complication (HCC)    Dyslipidemia    GERD (gastroesophageal reflux disease)    H/O hiatal hernia    Hypertension    Melanoma (HCC)    "back; left arm"   PONV (postoperative nausea and vomiting)    Rheumatoid arthritis (HCC)    Spasm of esophagus    Past Surgical History:  Procedure Laterality Date   aneursym repair     aneursym repair and stent placement  05/2017   BIOPSY  08/19/2022   Procedure: BIOPSY;  Surgeon: Meryl Dare, MD;  Location: [facility] ENDOSCOPY;   Service: Gastroenterology;;   CARPAL TUNNEL RELEASE Bilateral    COLON SURGERY  2007   removed 18" of colon   ERCP W/ METAL STENT PLACEMENT  11/2005   Hattie Perch 12/14/2005   ESOPHAGOGASTRODUODENOSCOPY (EGD) WITH ESOPHAGEAL DILATION     "several times'   ESOPHAGOGASTRODUODENOSCOPY (EGD) WITH PROPOFOL N/A 08/19/2022   Procedure: ESOPHAGOGASTRODUODENOSCOPY (EGD) WITH PROPOFOL;  Surgeon: Meryl Dare, MD;  Location: [facility] ENDOSCOPY;  Service: Gastroenterology;  Laterality: N/A;   ESOPHAGOGASTRODUODENOSCOPY (EGD) WITH PROPOFOL N/A 01/15/2023   Procedure: ESOPHAGOGASTRODUODENOSCOPY (EGD) WITH PROPOFOL;  Surgeon: Benancio Deeds, MD;  Location: [facility] ENDOSCOPY;  Service: Gastroenterology;  Laterality: N/A;   ESOPHAGOGASTRODUODENOSCOPY (EGD) WITH PROPOFOL N/A 01/20/2023   Procedure: ESOPHAGOGASTRODUODENOSCOPY (EGD) WITH PROPOFOL;  Surgeon: Napoleon Form, MD;  Location: [facility] ENDOSCOPY;  Service: Gastroenterology;  Laterality: N/A;   HAND SURGERY Bilateral    "plastic knuckles"   IR ANGIO INTRA EXTRACRAN SEL COM CAROTID INNOMINATE UNI R MOD SED  01/03/2023   IR ANGIO VERTEBRAL SEL SUBCLAVIAN INNOMINATE UNI L MOD SED  01/03/2023   IR ANGIO VERTEBRAL SEL VERTEBRAL UNI R MOD SED  01/03/2023   IR INTRAVSC STENT CERV CAROTID W/EMB-PROT MOD SED INCL ANGIO  01/03/2023   IR REPLC GASTRO/COLONIC TUBE PERCUT W/FLUORO  01/21/2023   IR US GUIDE VASC ACCESS RIGHT  01/03/2023   LAPAROSCOPIC CHOLECYSTECTOMY  01/2006   MELANOMA EXCISION     "back; left arm"   NASAL SEPTUM SURGERY  PELVIC FRACTURE SURGERY  1964   "busted in 2 places"   RADIOLOGY WITH ANESTHESIA N/A 01/03/2023   Procedure: IR WITH ANESTHESIA;  Surgeon: Radiologist, Medication, MD;  Location: [facility] OR;  Service: Radiology;  Laterality: N/A;   RESECTION DISTAL CLAVICAL Right 05/13/2016   Procedure: DISTAL CLAVICLE EXCISION;  Surgeon: Loreta Ave, MD;  Location: [facility];  Service: Orthopedics;  Laterality: Right;   SHOULDER ARTHROSCOPY  WITH ROTATOR CUFF REPAIR AND SUBACROMIAL DECOMPRESSION Right 05/13/2016   Procedure: RIGHT SHOULDER SCOPE DEBRIDEMENT, ACROMIOPLASTY, ROTATOR CUFF REPAIR; RELEASE BICEPS TENDON AND DEBRIDEMENT LABRUM;  Surgeon: Loreta Ave, MD;  Location: [facility];  Service: Orthopedics;  Laterality: Right;   TIBIA FRACTURE SURGERY Left 2006   "put a steel rod in it"     A IV Location/Drains/Wounds Patient Lines/Drains/Airways Status     Active Line/Drains/Airways     Name Placement date Placement time Site Days   Peripheral IV 02/14/24 Anterior;Distal;Right;Upper Arm 02/14/24  2013  Arm  1   Gastrostomy/Enterostomy Gastrostomy 18 Fr. LUQ 01/21/23  1746  LUQ  390            Intake/Output Last 24 hours No intake or output data in the 24 hours ending 02/15/24 1255  Labs/Imaging Results for orders placed or performed during the hospital encounter of 02/14/24 (from the past 48 hours)  Comprehensive metabolic panel     Status: Abnormal   Collection Time: 02/14/24  8:16 PM  Result Value Ref Range   Sodium 140 135 - 145 mmol/L   Potassium 4.7 3.5 - 5.1 mmol/L   Chloride 100 98 - 111 mmol/L   CO2 27 22 - 32 mmol/L   Glucose, Bld 197 (H) 70 - 99 mg/dL    Comment: Glucose reference range applies only to samples taken after fasting for at least 8 hours.   BUN 29 (H) 8 - 23 mg/dL   Creatinine, Ser 8.29 0.61 - 1.24 mg/dL   Calcium 9.0 8.9 - 56.2 mg/dL   Total Protein 6.2 (L) 6.5 - 8.1 g/dL   Albumin 3.1 (L) 3.5 - 5.0 g/dL   AST 25 15 - 41 U/L   ALT 18 0 - 44 U/L   Alkaline Phosphatase 83 38 - 126 U/L   Total Bilirubin 0.6 0.0 - 1.2 mg/dL   GFR, Estimated >13 >08 mL/min    Comment: (NOTE) Calculated using the CKD-EPI Creatinine Equation (2021)    Anion gap 13 5 - 15    Comment: Performed at [facility], [address], [place], [place] [address]  CBC with Differential     Status: Abnormal   Collection Time: 02/14/24  8:16 PM  Result Value Ref Range   WBC 13.0 (H)  4.0 - 10.5 K/uL   RBC 3.57 (L) 4.22 - 5.81 MIL/uL   Hemoglobin 10.1 (L) 13.0 - 17.0 g/dL   HCT 69.6 (L) 29.5 - 28.4 %   MCV 89.4 80.0 - 100.0 fL   MCH 28.3 26.0 - 34.0 pg   MCHC 31.7 30.0 - 36.0 g/dL   RDW 13.2 (H) 44.0 - 10.2 %   Platelets 185 150 - 400 K/uL   nRBC 0.0 0.0 - 0.2 %   Neutrophils Relative % 89 %   Neutro Abs 11.6 (H) 1.7 - 7.7 K/uL   Lymphocytes Relative 2 %   Lymphs Abs 0.3 (L) 0.7 - 4.0 K/uL   Monocytes Relative 8 %   Monocytes Absolute 1.1 (H) 0.1 - 1.0  K/uL   Eosinophils Relative 0 %   Eosinophils Absolute 0.0 0.0 - 0.5 K/uL   Basophils Relative 0 %   Basophils Absolute 0.0 0.0 - 0.1 K/uL   Immature Granulocytes 1 %   Abs Immature Granulocytes 0.06 0.00 - 0.07 K/uL    Comment: Performed at [facility], [address], [place], [place] [address]  CBG monitoring, ED     Status: Abnormal   Collection Time: 02/15/24  6:10 AM  Result Value Ref Range   Glucose-Capillary 165 (H) 70 - 99 mg/dL    Comment: Glucose reference range applies only to samples taken after fasting for at least 8 hours.  Hemoglobin A1c     Status: Abnormal   Collection Time: 02/15/24  6:24 AM  Result Value Ref Range   Hgb A1c MFr Bld 7.2 (H) 4.8 - 5.6 %    Comment: (NOTE) Pre diabetes:          5.7%-6.4%  Diabetes:              >6.4%  Glycemic control for   <7.0% adults with diabetes    Mean Plasma Glucose 159.94 mg/dL    Comment: Performed at [facility], [address], [place], [place] [address]  CBG monitoring, ED     Status: Abnormal   Collection Time: 02/15/24  8:03 AM  Result Value Ref Range   Glucose-Capillary 171 (H) 70 - 99 mg/dL    Comment: Glucose reference range applies only to samples taken after fasting for at least 8 hours.  CBG monitoring, ED     Status: Abnormal   Collection Time: 02/15/24  8:24 AM  Result Value Ref Range   Glucose-Capillary 162 (H) 70 - 99 mg/dL    Comment: Glucose reference range applies only to samples taken after fasting for at  least 8 hours.   Comment 1 Notify RN    Comment 2 Document in Chart   CBG monitoring, ED     Status: Abnormal   Collection Time: 02/15/24 11:06 AM  Result Value Ref Range   Glucose-Capillary 136 (H) 70 - 99 mg/dL    Comment: Glucose reference range applies only to samples taken after fasting for at least 8 hours.  CBG monitoring, ED     Status: Abnormal   Collection Time: 02/15/24 12:30 PM  Result Value Ref Range   Glucose-Capillary 151 (H) 70 - 99 mg/dL    Comment: Glucose reference range applies only to samples taken after fasting for at least 8 hours.   CT Shoulder Right Wo Contrast Result Date: 02/15/2024 CLINICAL DATA:  Right shoulder injury, fracture EXAM: CT OF THE UPPER RIGHT EXTREMITY WITHOUT CONTRAST TECHNIQUE: Multidetector CT imaging of the upper right extremity was performed according to the standard protocol. RADIATION DOSE REDUCTION: This exam was performed according to the departmental dose-optimization program which includes automated exposure control, adjustment of the mA and/or kV according to patient size and/or use of iterative reconstruction technique. COMPARISON:  None Available. FINDINGS: Bones/Joint/Cartilage There is a comminuted, impacted fracture of the right humeral head with an oblique fracture involving the surgical neck of the humerus with lateral angulation and impaction with 2.5 cm override of the fracture fragments. The articular surface of the humeral head appears externally rotated but is still seated within the glenoid fossa. Greater and lesser tuberosities appear intact. Scapula and visualized clavicle are intact. Acromioclavicular joint space is preserved. Respiratory motion artifact slightly limits evaluation of the thoracic cage,, the visualized osseous  structures appear intact. Ligaments Suboptimally assessed by CT. Muscles and Tendons There is heterogeneous attenuation and focal thinning of the supraspinatus tendon best seen on coronal image # 45/6 and  disruption of this tendon is not excluded though not optimally visualized on this examination. Infraspinatus and subscapularis tendons appear intact. There is marked thickening and hyperdensity the deltoid and to a lesser extent the visualized triceps and peripheral left latissimus dorsi musculature in keeping with intramuscular hemorrhage. Soft tissues Right shoulder effusion is present. Advanced vascular calcifications are seen within the central right upper extremity arterial vasculature. Extensive subcutaneous edema within the visualized right upper extremity. IMPRESSION: 1. Comminuted, impacted fracture of the right humeral head with an oblique fracture involving the surgical neck of the humerus with lateral angulation and impaction with 2.5 cm override of the fracture fragments. The articular surface of the humeral head appears externally rotated but is still seated within the glenoid fossa. 2. Heterogeneous attenuation and focal thinning of the supraspinatus tendon. Disruption of this tendon is not excluded though not optimally visualized on this examination. This could be better assessed with MRI examination. 3. Marked thickening and hyperdensity the deltoid and to a lesser extent the visualized triceps and peripheral left latissimus dorsi musculature in keeping with intramuscular hemorrhage. 4. Right shoulder effusion. 5. Extensive subcutaneous edema within the visualized right upper extremity. 6. Advanced vascular calcifications within the central right upper extremity arterial vasculature. Electronically Signed   By: Helyn Numbers M.D.   On: 02/15/2024 00:18   CT Head Wo Contrast Result Date: 02/14/2024 CLINICAL DATA:  Head trauma, minor (Age >= 65y); Neck trauma (Age >= 65y) EXAM: CT HEAD WITHOUT CONTRAST CT CERVICAL SPINE WITHOUT CONTRAST TECHNIQUE: Multidetector CT imaging of the head and cervical spine was performed following the standard protocol without intravenous contrast. Multiplanar CT image  reconstructions of the cervical spine were also generated. RADIATION DOSE REDUCTION: This exam was performed according to the departmental dose-optimization program which includes automated exposure control, adjustment of the mA and/or kV according to patient size and/or use of iterative reconstruction technique. COMPARISON:  None Available. FINDINGS: CT HEAD FINDINGS Brain: Cerebral ventricle sizes are concordant with the degree of cerebral volume loss. Patchy and confluent areas of decreased attenuation are noted throughout the deep and periventricular white matter of the cerebral hemispheres bilaterally, compatible with chronic microvascular ischemic disease. No evidence of large-territorial acute infarction. No parenchymal hemorrhage. No mass lesion. No extra-axial collection. No mass effect or midline shift. No hydrocephalus. Basilar cisterns are patent. Vascular: No hyperdense vessel. Atherosclerotic calcifications are present within the cavernous internal carotid and vertebral arteries. Skull: No acute fracture or focal lesion. Sinuses/Orbits: Paranasal sinuses and mastoid air cells are clear. The orbits are unremarkable. Other: None. CT CERVICAL SPINE FINDINGS Alignment: Grade 1 anterolisthesis of C4 on C5. Skull base and vertebrae: Multilevel moderate severe degenerative changes of the spine. Associated severe osseous neural foraminal stenosis at the right C3-C4, C5-C6 levels. No severe osseous neural central canal stenosis. No acute fracture. No aggressive appearing focal osseous lesion or focal pathologic process. Soft tissues and spinal canal: No prevertebral fluid or swelling. No visible canal hematoma. Upper chest: Biapical pleural/pulmonary scarring. Emphysematous changes. Other: Atherosclerotic plaque of the carotid arteries within the neck. IMPRESSION: 1. No acute intracranial abnormality. 2. No acute displaced fracture or traumatic listhesis of the cervical spine. 3. Multilevel moderate severe  degenerative changes of the spine. Associated severe osseous neural foraminal stenosis at the right C3-C4, C5-C6 levels. Electronically Signed   By:  Tish Frederickson M.D.   On: 02/14/2024 21:42   CT Cervical Spine Wo Contrast Result Date: 02/14/2024 CLINICAL DATA:  Head trauma, minor (Age >= 65y); Neck trauma (Age >= 65y) EXAM: CT HEAD WITHOUT CONTRAST CT CERVICAL SPINE WITHOUT CONTRAST TECHNIQUE: Multidetector CT imaging of the head and cervical spine was performed following the standard protocol without intravenous contrast. Multiplanar CT image reconstructions of the cervical spine were also generated. RADIATION DOSE REDUCTION: This exam was performed according to the departmental dose-optimization program which includes automated exposure control, adjustment of the mA and/or kV according to patient size and/or use of iterative reconstruction technique. COMPARISON:  None Available. FINDINGS: CT HEAD FINDINGS Brain: Cerebral ventricle sizes are concordant with the degree of cerebral volume loss. Patchy and confluent areas of decreased attenuation are noted throughout the deep and periventricular white matter of the cerebral hemispheres bilaterally, compatible with chronic microvascular ischemic disease. No evidence of large-territorial acute infarction. No parenchymal hemorrhage. No mass lesion. No extra-axial collection. No mass effect or midline shift. No hydrocephalus. Basilar cisterns are patent. Vascular: No hyperdense vessel. Atherosclerotic calcifications are present within the cavernous internal carotid and vertebral arteries. Skull: No acute fracture or focal lesion. Sinuses/Orbits: Paranasal sinuses and mastoid air cells are clear. The orbits are unremarkable. Other: None. CT CERVICAL SPINE FINDINGS Alignment: Grade 1 anterolisthesis of C4 on C5. Skull base and vertebrae: Multilevel moderate severe degenerative changes of the spine. Associated severe osseous neural foraminal stenosis at the right  C3-C4, C5-C6 levels. No severe osseous neural central canal stenosis. No acute fracture. No aggressive appearing focal osseous lesion or focal pathologic process. Soft tissues and spinal canal: No prevertebral fluid or swelling. No visible canal hematoma. Upper chest: Biapical pleural/pulmonary scarring. Emphysematous changes. Other: Atherosclerotic plaque of the carotid arteries within the neck. IMPRESSION: 1. No acute intracranial abnormality. 2. No acute displaced fracture or traumatic listhesis of the cervical spine. 3. Multilevel moderate severe degenerative changes of the spine. Associated severe osseous neural foraminal stenosis at the right C3-C4, C5-C6 levels. Electronically Signed   By: Tish Frederickson M.D.   On: 02/14/2024 21:42   DG Chest Port 1 View Result Date: 02/14/2024 CLINICAL DATA:  Fall. EXAM: PORTABLE CHEST 1 VIEW COMPARISON:  05/16/2023 FINDINGS: Stable heart size and mediastinal contours. Mild chronic elevation of right hemidiaphragm. Bibasilar atelectasis without confluent airspace disease. No pleural fluid or pneumothorax. Mild chronic rib deformity at the left lung apex. IMPRESSION: Bibasilar atelectasis. Electronically Signed   By: Narda Rutherford M.D.   On: 02/14/2024 20:49   DG Shoulder Right Result Date: 02/14/2024 CLINICAL DATA:  Pain after fall. EXAM: RIGHT SHOULDER - 2+ VIEW COMPARISON:  None Available. FINDINGS: Comminuted and displaced proximal humerus fracture. The dominant fracture plane is through the anatomic neck. There is mild apex medial angulation. Glenohumeral dislocation. Acromioclavicular alignment is normal. Generalized soft tissue edema. IMPRESSION: Comminuted displaced proximal humerus fracture. Electronically Signed   By: Narda Rutherford M.D.   On: 02/14/2024 20:46    Pending Labs Unresulted Labs (From admission, onward)     Start     Ordered   02/16/24 0500  CBC  Tomorrow morning,   R        02/15/24 0544            Vitals/Pain Today's  Vitals   02/15/24 1048 02/15/24 1228 02/15/24 1241 02/15/24 1251  BP: (!) 130/58 (!) 130/58  120/70  Pulse: 71   77  Resp: 18   16  Temp: 97.6 F (36.4  C)     TempSrc:      SpO2: 97%   97%  PainSc:   2      Isolation Precautions No active isolations  Medications Medications  HYDROmorphone (DILAUDID) injection 1 mg (1 mg Intravenous Given 02/15/24 1115)  naloxone (NARCAN) injection 0.4 mg (has no administration in time range)  acetaminophen (TYLENOL) tablet 650 mg (has no administration in time range)  oxyCODONE (Oxy IR/ROXICODONE) immediate release tablet 5 mg (has no administration in time range)  insulin aspart (novoLOG) injection 0-9 Units (1 Units Subcutaneous Given 02/15/24 1112)  hydrocortisone sodium succinate (SOLU-CORTEF) 100 MG injection 37.5 mg (37.5 mg Intravenous Given 02/15/24 0620)  amLODipine (NORVASC) tablet 5 mg (5 mg Per Tube Given 02/15/24 1228)  atorvastatin (LIPITOR) tablet 40 mg (40 mg Per Tube Given 02/15/24 1227)  citalopram (CELEXA) tablet 10 mg (10 mg Per Tube Given 02/15/24 1228)  famotidine (PEPCID) tablet 40 mg (40 mg Per Tube Given 02/15/24 1228)  ferrous sulfate 300 (60 Fe) MG/5ML syrup 300 mg (has no administration in time range)  fludrocortisone (FLORINEF) tablet 0.1 mg (has no administration in time range)  fluticasone (FLONASE) 50 MCG/ACT nasal spray 2 spray (has no administration in time range)  folic acid (FOLVITE) tablet 1 mg (1 mg Per Tube Given 02/15/24 1228)  levothyroxine (SYNTHROID) tablet 50 mcg (has no administration in time range)  oxyCODONE (Oxy IR/ROXICODONE) immediate release tablet 5 mg (has no administration in time range)  predniSONE (DELTASONE) tablet 5 mg (5 mg Per Tube Given 02/15/24 1228)  sucralfate (CARAFATE) 1 GM/10ML suspension 1 g (has no administration in time range)  free water 200 mL (200 mLs Per Tube Given 02/15/24 1241)  HYDROmorphone (DILAUDID) injection 1 mg (1 mg Intravenous Given 02/14/24 2022)  HYDROmorphone (DILAUDID)  injection 1 mg (1 mg Intravenous Given 02/15/24 0003)  morphine (PF) 4 MG/ML injection 4 mg (4 mg Intravenous Given 02/15/24 0257)    Mobility walks with device     Focused Assessments Cardiac Assessment Handoff:    Lab Results  Component Value Date   CKTOTAL 21 (L) 05/16/2023   CKMB 1.9 08/15/2012   TROPONINI <0.30 08/15/2012   Lab Results  Component Value Date   DDIMER 1.45 (H) 01/31/2021   Does the Patient currently have chest pain? No    R Recommendations: See Admitting Provider Note  Report given to:   Additional Notes:

## 2024-02-15 NOTE — Progress Notes (Signed)
  PROGRESS NOTE  Patient admitted earlier this morning. See H&P.   Patient presented after fall at home after tripping over his ex-wife's dog.  He suffered from right humeral fracture.  -Per orthopedic surgery, nonoperative management.  Follow-up with Dr. Carola Frost in 2 weeks. -PT OT -Pain control -Tube feeding with history of SCC of tongue status post chemoradiation, esophagitis plus stenosis    Status is: Inpatient Remains inpatient appropriate because: pain control, PT OT    Noralee Stain, DO [facility] 02/15/2024, 1:38 PM  Available via Epic secure chat 7am-7pm After these hours, please refer to coverage provider listed on [email or web address]

## 2024-02-15 NOTE — H&P (Signed)
History and Physical    [name] WGN:[id number] DOB: 05-09-1946 DOA: 02/14/2024  PCP: Lorrine Kin, MD  Patient coming from: Home  Chief Complaint: Right shoulder pain  HPI: [name] is a 78 y.o. 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  Also reported hitting his head.  Vital signs stable.  Labs showing WBC count 13.0, hemoglobin 10.1 (stable), MCV 89.4, glucose 197. CT head/C-spine and chest x-ray without acute traumatic injuries.  CT of right shoulder showing: "IMPRESSION: 1. Comminuted, impacted fracture of the right humeral head with an oblique fracture involving the surgical neck of the humerus with lateral angulation and impaction with 2.5 cm override of the fracture fragments. The articular surface of the humeral head appears externally rotated but is still seated within the glenoid fossa. 2. Heterogeneous attenuation and focal thinning of the supraspinatus tendon. Disruption of this tendon is not excluded though not optimally visualized on this examination. This could be better assessed with MRI examination. 3. Marked thickening and hyperdensity the deltoid and to a lesser extent the visualized triceps and peripheral left latissimus dorsi musculature in keeping with intramuscular hemorrhage. 4. Right shoulder effusion. 5. Extensive subcutaneous edema within the visualized right upper extremity. 6. Advanced vascular calcifications within the central right  upper extremity arterial vasculature."  Patient was given Dilaudid and morphine. Orthopedics consulted and [facility] called to admit.  Patient states he tripped over his dog and fell on his right shoulder and since then having severe pain.  His head did not directly hit the floor.  No loss of consciousness reported.  Denies any numbness or tingling of his arm/hand.  No other complaints.  Patient states he was feeling well prior to this episode of fall.  Denies fevers, chills, cough, shortness of breath, nausea, vomiting, abdominal pain, diarrhea, or any urinary symptoms.  Denies any hematemesis, hematochezia, and melena.  Review of Systems:  Review of Systems  All other systems reviewed and are negative.   Past Medical History:  Diagnosis Date   Aortic aneurysm (HCC) 05/2017   Chronic back pain    Diabetes mellitus without complication (HCC)    Dyslipidemia    GERD (gastroesophageal reflux disease)    H/O hiatal hernia    Hypertension    Melanoma (HCC)    "back; left arm"   PONV (postoperative nausea and vomiting)    Rheumatoid arthritis (HCC)    Spasm of esophagus     Past Surgical History:  Procedure Laterality Date   aneursym repair     aneursym repair and stent placement  05/2017   BIOPSY  08/19/2022   Procedure: BIOPSY;  Surgeon: Meryl Dare, MD;  Location: [facility] ENDOSCOPY;  Service: Gastroenterology;;   CARPAL TUNNEL RELEASE Bilateral    COLON SURGERY  2007   removed 18" of colon   ERCP W/ METAL STENT PLACEMENT  11/2005   Hattie Perch 12/14/2005   ESOPHAGOGASTRODUODENOSCOPY (EGD) WITH ESOPHAGEAL DILATION     "several times'   ESOPHAGOGASTRODUODENOSCOPY (EGD) WITH PROPOFOL N/A 08/19/2022   Procedure: ESOPHAGOGASTRODUODENOSCOPY (EGD) WITH PROPOFOL;  Surgeon: Meryl Dare, MD;  Location: [facility] ENDOSCOPY;  Service: Gastroenterology;  Laterality: N/A;   ESOPHAGOGASTRODUODENOSCOPY (EGD) WITH PROPOFOL N/A 01/15/2023   Procedure: ESOPHAGOGASTRODUODENOSCOPY (EGD) WITH PROPOFOL;   Surgeon: Benancio Deeds, MD;  Location: [facility] ENDOSCOPY;  Service: Gastroenterology;  Laterality: N/A;   ESOPHAGOGASTRODUODENOSCOPY (EGD) WITH PROPOFOL N/A 01/20/2023   Procedure: ESOPHAGOGASTRODUODENOSCOPY (EGD) WITH PROPOFOL;  Surgeon: Napoleon Form, MD;  Location: [facility] ENDOSCOPY;  Service: Gastroenterology;  Laterality: N/A;   HAND SURGERY Bilateral    "plastic knuckles"   IR ANGIO INTRA EXTRACRAN SEL COM CAROTID INNOMINATE UNI R MOD SED  01/03/2023   IR ANGIO VERTEBRAL SEL SUBCLAVIAN INNOMINATE UNI L MOD SED  01/03/2023   IR ANGIO VERTEBRAL SEL VERTEBRAL UNI R MOD SED  01/03/2023   IR INTRAVSC STENT CERV CAROTID W/EMB-PROT MOD SED INCL ANGIO  01/03/2023   IR REPLC GASTRO/COLONIC TUBE PERCUT W/FLUORO  01/21/2023   IR US GUIDE VASC ACCESS RIGHT  01/03/2023   LAPAROSCOPIC CHOLECYSTECTOMY  01/2006   MELANOMA EXCISION     "back; left arm"   NASAL SEPTUM SURGERY     PELVIC FRACTURE SURGERY  1964   "busted in 2 places"   RADIOLOGY WITH ANESTHESIA N/A 01/03/2023   Procedure: IR WITH ANESTHESIA;  Surgeon: Radiologist, Medication, MD;  Location: [facility] OR;  Service: Radiology;  Laterality: N/A;   RESECTION DISTAL CLAVICAL Right 05/13/2016   Procedure: DISTAL CLAVICLE EXCISION;  Surgeon: Loreta Ave, MD;  Location: [facility];  Service: Orthopedics;  Laterality: Right;   SHOULDER ARTHROSCOPY WITH ROTATOR CUFF REPAIR AND SUBACROMIAL DECOMPRESSION Right 05/13/2016   Procedure: RIGHT SHOULDER SCOPE DEBRIDEMENT, ACROMIOPLASTY, ROTATOR CUFF REPAIR; RELEASE BICEPS TENDON AND DEBRIDEMENT LABRUM;  Surgeon: Loreta Ave, MD;  Location: [facility];  Service: Orthopedics;  Laterality: Right;   TIBIA FRACTURE SURGERY Left 2006   "put a steel rod in it"     reports that he quit smoking about 18 years ago. His smoking use included cigarettes. He started smoking about 64 years ago. He has a 46 pack-year smoking history. He has never used smokeless tobacco. He reports current alcohol  use of about 9.0 standard drinks of alcohol per week. He reports that he does not use drugs.  No Known Allergies  Family History  Problem Relation Age of Onset   Heart disease Mother    Heart disease Father    Cancer Maternal Grandmother     Prior to Admission medications   Medication Sig Start Date End Date Taking? Authorizing Provider  abatacept (ORENCIA) 250 MG injection Inject 250 mg into the vein every 30 (thirty) days. Patient taking differently: Inject 750 mg into the vein every 30 (thirty) days. 02/12/21   Evlyn Kanner, MD  Acetaminophen (TYLENOL ARTHRITIS PAIN PO) Take 325 mg by mouth as needed (As needed).    [provider]  amLODipine (NORVASC) 5 MG tablet Take 1 tablet (5 mg total) by mouth daily. 05/23/23   Rhetta Mura, MD  atorvastatin (LIPITOR) 40 MG tablet Place 1 tablet (40 mg total) into feeding tube daily. 01/28/23   Marrianne Mood, MD  diclofenac Sodium (VOLTAREN) 1 % GEL Apply 2 g topically 4 (four) times daily.    [provider]  famotidine (PEPCID) 40 MG tablet Place 1 tablet (40 mg total) into feeding tube daily. 05/23/23   Rhetta Mura, MD  Feeding Supplies KIT 1 Box by Does not apply route daily. Patient will need pump and supplies for continuous tube feeds and 60mL syringes for free water flushes 12/31/22  Mapp, Tavien, MD  ferrous sulfate 300 (60 Fe) MG/5ML syrup Place 5 mLs (300 mg total) into feeding tube daily. 01/28/23   Marrianne Mood, MD  fludrocortisone (FLORINEF) 0.1 MG tablet Place 1 tablet (0.1 mg total) into feeding tube daily. 01/29/23   Marrianne Mood, MD  fluticasone (FLONASE) 50 MCG/ACT nasal spray Place 1 spray into both nostrils daily.    [provider]  folic acid (FOLVITE) 1 MG tablet Place 1 tablet (1 mg total) into feeding tube daily. 01/28/23   Marrianne Mood, MD  hydrocortisone (CORTEF) 10 MG tablet Place 1 tablet (10 mg total) into feeding tube daily at 2 PM. 01/28/23   Marrianne Mood, MD   hydrocortisone (CORTEF) 5 MG tablet Place 3 tablets (15 mg total) into feeding tube daily before breakfast. 01/28/23   Marrianne Mood, MD  levothyroxine (SYNTHROID) 50 MCG tablet Place 1 tablet (50 mcg total) into feeding tube daily. 01/28/23   Marrianne Mood, MD  loratadine (CLARITIN) 10 MG tablet Take 10 mg by mouth daily.    [provider]  magnesium oxide (MAG-OX) 400 (240 Mg) MG tablet Place 1 tablet (400 mg total) into feeding tube daily. 01/28/23   Marrianne Mood, MD  metFORMIN (GLUCOPHAGE) 500 MG tablet Place 1 tablet (500 mg total) into feeding tube daily. 01/28/23   Marrianne Mood, MD  methotrexate 50 MG/2ML injection Inject 15 mg into the skin once a week. 04/25/23 04/17/30  [provider]  Nutritional Supplements (FEEDING SUPPLEMENT, OSMOLITE 1.5 CAL,) LIQD Place 1,530 mLs into feeding tube daily. Run for 18 hours from 1600 to 1000 01/28/23   Marrianne Mood, MD  omeprazole (PRILOSEC OTC) 20 MG tablet Take 2 tablets (40 mg total) by mouth daily. 05/23/23   Rhetta Mura, MD  Protein (FEEDING SUPPLEMENT, PROSOURCE TF20,) liquid Place 60 mLs into feeding tube daily. 05/23/23   Rhetta Mura, MD  sucralfate (CARAFATE) 1 GM/10ML suspension Place 10 mLs (1 g total) into feeding tube every 6 (six) hours. 01/28/23   Marrianne Mood, MD  Water For Irrigation, Sterile (FREE WATER) SOLN Place 200 mLs into feeding tube 6 (six) times daily. 05/23/23   Rhetta Mura, MD    Physical Exam: Vitals:   02/14/24 1901 02/14/24 1902 02/14/24 2311  BP:  (!) 128/56 112/71  Pulse:  69 70  Resp:  18 20  Temp:  (!) 97.5 F (36.4 C) 97.7 F (36.5 C)  TempSrc:  Oral Oral  SpO2: 96% 99% 97%    Physical Exam Vitals reviewed.  Constitutional:      General: He is not in acute distress. HENT:     Head: Normocephalic and atraumatic.  Eyes:     Extraocular Movements: Extraocular movements intact.  Cardiovascular:     Rate and Rhythm: Normal rate and regular  rhythm.     Pulses: Normal pulses.  Pulmonary:     Effort: Pulmonary effort is normal. No respiratory distress.     Breath sounds: Normal breath sounds. No wheezing or rales.  Abdominal:     General: Bowel sounds are normal. There is no distension.     Palpations: Abdomen is soft.     Tenderness: There is no abdominal tenderness. There is no guarding.  Musculoskeletal:     Cervical back: Normal range of motion.     Right lower leg: No edema.     Left lower leg: No edema.     Comments: Right shoulder swollen.  Right upper extremity neurovascularly intact.  Skin:    General: Skin  is warm and dry.  Neurological:     General: No focal deficit present.     Mental Status: He is alert and oriented to person, place, and time.     Labs on Admission: I have personally reviewed following labs and imaging studies  CBC: Recent Labs  Lab 02/14/24 2016  WBC 13.0*  NEUTROABS 11.6*  HGB 10.1*  HCT 31.9*  MCV 89.4  PLT 185   Basic Metabolic Panel: Recent Labs  Lab 02/14/24 2016  NA 140  K 4.7  CL 100  CO2 27  GLUCOSE 197*  BUN 29*  CREATININE 1.00  CALCIUM 9.0   GFR: CrCl cannot be calculated (Unknown ideal weight.). Liver Function Tests: Recent Labs  Lab 02/14/24 2016  AST 25  ALT 18  ALKPHOS 83  BILITOT 0.6  PROT 6.2*  ALBUMIN 3.1*   No results for input(s): "LIPASE", "AMYLASE" in the last 168 hours. No results for input(s): "AMMONIA" in the last 168 hours. Coagulation Profile: No results for input(s): "INR", "PROTIME" in the last 168 hours. Cardiac Enzymes: No results for input(s): "CKTOTAL", "CKMB", "CKMBINDEX", "TROPONINI" in the last 168 hours. BNP (last 3 results) No results for input(s): "PROBNP" in the last 8760 hours. HbA1C: No results for input(s): "HGBA1C" in the last 72 hours. CBG: No results for input(s): "GLUCAP" in the last 168 hours. Lipid Profile: No results for input(s): "CHOL", "HDL", "LDLCALC", "TRIG", "CHOLHDL", "LDLDIRECT" in the last 72  hours. Thyroid Function Tests: No results for input(s): "TSH", "T4TOTAL", "FREET4", "T3FREE", "THYROIDAB" in the last 72 hours. Anemia Panel: No results for input(s): "VITAMINB12", "FOLATE", "FERRITIN", "TIBC", "IRON", "RETICCTPCT" in the last 72 hours. Urine analysis:    Component Value Date/Time   COLORURINE YELLOW 05/16/2023 1755   APPEARANCEUR CLEAR 05/16/2023 1755   LABSPEC 1.017 05/16/2023 1755   PHURINE 7.0 05/16/2023 1755   GLUCOSEU NEGATIVE 05/16/2023 1755   HGBUR NEGATIVE 05/16/2023 1755   BILIRUBINUR NEGATIVE 05/16/2023 1755   KETONESUR NEGATIVE 05/16/2023 1755   PROTEINUR NEGATIVE 05/16/2023 1755   UROBILINOGEN 0.2 05/28/2015 1540   NITRITE NEGATIVE 05/16/2023 1755   LEUKOCYTESUR NEGATIVE 05/16/2023 1755    Radiological Exams on Admission: CT Shoulder Right Wo Contrast Result Date: 02/15/2024 CLINICAL DATA:  Right shoulder injury, fracture EXAM: CT OF THE UPPER RIGHT EXTREMITY WITHOUT CONTRAST TECHNIQUE: Multidetector CT imaging of the upper right extremity was performed according to the standard protocol. RADIATION DOSE REDUCTION: This exam was performed according to the departmental dose-optimization program which includes automated exposure control, adjustment of the mA and/or kV according to patient size and/or use of iterative reconstruction technique. COMPARISON:  None Available. FINDINGS: Bones/Joint/Cartilage There is a comminuted, impacted fracture of the right humeral head with an oblique fracture involving the surgical neck of the humerus with lateral angulation and impaction with 2.5 cm override of the fracture fragments. The articular surface of the humeral head appears externally rotated but is still seated within the glenoid fossa. Greater and lesser tuberosities appear intact. Scapula and visualized clavicle are intact. Acromioclavicular joint space is preserved. Respiratory motion artifact slightly limits evaluation of the thoracic cage,, the visualized osseous  structures appear intact. Ligaments Suboptimally assessed by CT. Muscles and Tendons There is heterogeneous attenuation and focal thinning of the supraspinatus tendon best seen on coronal image # 45/6 and disruption of this tendon is not excluded though not optimally visualized on this examination. Infraspinatus and subscapularis tendons appear intact. There is marked thickening and hyperdensity the deltoid and to a lesser extent the visualized  triceps and peripheral left latissimus dorsi musculature in keeping with intramuscular hemorrhage. Soft tissues Right shoulder effusion is present. Advanced vascular calcifications are seen within the central right upper extremity arterial vasculature. Extensive subcutaneous edema within the visualized right upper extremity. IMPRESSION: 1. Comminuted, impacted fracture of the right humeral head with an oblique fracture involving the surgical neck of the humerus with lateral angulation and impaction with 2.5 cm override of the fracture fragments. The articular surface of the humeral head appears externally rotated but is still seated within the glenoid fossa. 2. Heterogeneous attenuation and focal thinning of the supraspinatus tendon. Disruption of this tendon is not excluded though not optimally visualized on this examination. This could be better assessed with MRI examination. 3. Marked thickening and hyperdensity the deltoid and to a lesser extent the visualized triceps and peripheral left latissimus dorsi musculature in keeping with intramuscular hemorrhage. 4. Right shoulder effusion. 5. Extensive subcutaneous edema within the visualized right upper extremity. 6. Advanced vascular calcifications within the central right upper extremity arterial vasculature. Electronically Signed   By: Helyn Numbers M.D.   On: 02/15/2024 00:18   CT Head Wo Contrast Result Date: 02/14/2024 CLINICAL DATA:  Head trauma, minor (Age >= 65y); Neck trauma (Age >= 65y) EXAM: CT HEAD WITHOUT  CONTRAST CT CERVICAL SPINE WITHOUT CONTRAST TECHNIQUE: Multidetector CT imaging of the head and cervical spine was performed following the standard protocol without intravenous contrast. Multiplanar CT image reconstructions of the cervical spine were also generated. RADIATION DOSE REDUCTION: This exam was performed according to the departmental dose-optimization program which includes automated exposure control, adjustment of the mA and/or kV according to patient size and/or use of iterative reconstruction technique. COMPARISON:  None Available. FINDINGS: CT HEAD FINDINGS Brain: Cerebral ventricle sizes are concordant with the degree of cerebral volume loss. Patchy and confluent areas of decreased attenuation are noted throughout the deep and periventricular white matter of the cerebral hemispheres bilaterally, compatible with chronic microvascular ischemic disease. No evidence of large-territorial acute infarction. No parenchymal hemorrhage. No mass lesion. No extra-axial collection. No mass effect or midline shift. No hydrocephalus. Basilar cisterns are patent. Vascular: No hyperdense vessel. Atherosclerotic calcifications are present within the cavernous internal carotid and vertebral arteries. Skull: No acute fracture or focal lesion. Sinuses/Orbits: Paranasal sinuses and mastoid air cells are clear. The orbits are unremarkable. Other: None. CT CERVICAL SPINE FINDINGS Alignment: Grade 1 anterolisthesis of C4 on C5. Skull base and vertebrae: Multilevel moderate severe degenerative changes of the spine. Associated severe osseous neural foraminal stenosis at the right C3-C4, C5-C6 levels. No severe osseous neural central canal stenosis. No acute fracture. No aggressive appearing focal osseous lesion or focal pathologic process. Soft tissues and spinal canal: No prevertebral fluid or swelling. No visible canal hematoma. Upper chest: Biapical pleural/pulmonary scarring. Emphysematous changes. Other: Atherosclerotic  plaque of the carotid arteries within the neck. IMPRESSION: 1. No acute intracranial abnormality. 2. No acute displaced fracture or traumatic listhesis of the cervical spine. 3. Multilevel moderate severe degenerative changes of the spine. Associated severe osseous neural foraminal stenosis at the right C3-C4, C5-C6 levels. Electronically Signed   By: Tish Frederickson M.D.   On: 02/14/2024 21:42   CT Cervical Spine Wo Contrast Result Date: 02/14/2024 CLINICAL DATA:  Head trauma, minor (Age >= 65y); Neck trauma (Age >= 65y) EXAM: CT HEAD WITHOUT CONTRAST CT CERVICAL SPINE WITHOUT CONTRAST TECHNIQUE: Multidetector CT imaging of the head and cervical spine was performed following the standard protocol without intravenous contrast. Multiplanar CT image reconstructions  of the cervical spine were also generated. RADIATION DOSE REDUCTION: This exam was performed according to the departmental dose-optimization program which includes automated exposure control, adjustment of the mA and/or kV according to patient size and/or use of iterative reconstruction technique. COMPARISON:  None Available. FINDINGS: CT HEAD FINDINGS Brain: Cerebral ventricle sizes are concordant with the degree of cerebral volume loss. Patchy and confluent areas of decreased attenuation are noted throughout the deep and periventricular white matter of the cerebral hemispheres bilaterally, compatible with chronic microvascular ischemic disease. No evidence of large-territorial acute infarction. No parenchymal hemorrhage. No mass lesion. No extra-axial collection. No mass effect or midline shift. No hydrocephalus. Basilar cisterns are patent. Vascular: No hyperdense vessel. Atherosclerotic calcifications are present within the cavernous internal carotid and vertebral arteries. Skull: No acute fracture or focal lesion. Sinuses/Orbits: Paranasal sinuses and mastoid air cells are clear. The orbits are unremarkable. Other: None. CT CERVICAL SPINE FINDINGS  Alignment: Grade 1 anterolisthesis of C4 on C5. Skull base and vertebrae: Multilevel moderate severe degenerative changes of the spine. Associated severe osseous neural foraminal stenosis at the right C3-C4, C5-C6 levels. No severe osseous neural central canal stenosis. No acute fracture. No aggressive appearing focal osseous lesion or focal pathologic process. Soft tissues and spinal canal: No prevertebral fluid or swelling. No visible canal hematoma. Upper chest: Biapical pleural/pulmonary scarring. Emphysematous changes. Other: Atherosclerotic plaque of the carotid arteries within the neck. IMPRESSION: 1. No acute intracranial abnormality. 2. No acute displaced fracture or traumatic listhesis of the cervical spine. 3. Multilevel moderate severe degenerative changes of the spine. Associated severe osseous neural foraminal stenosis at the right C3-C4, C5-C6 levels. Electronically Signed   By: Tish Frederickson M.D.   On: 02/14/2024 21:42   DG Chest Port 1 View Result Date: 02/14/2024 CLINICAL DATA:  Fall. EXAM: PORTABLE CHEST 1 VIEW COMPARISON:  05/16/2023 FINDINGS: Stable heart size and mediastinal contours. Mild chronic elevation of right hemidiaphragm. Bibasilar atelectasis without confluent airspace disease. No pleural fluid or pneumothorax. Mild chronic rib deformity at the left lung apex. IMPRESSION: Bibasilar atelectasis. Electronically Signed   By: Narda Rutherford M.D.   On: 02/14/2024 20:49   DG Shoulder Right Result Date: 02/14/2024 CLINICAL DATA:  Pain after fall. EXAM: RIGHT SHOULDER - 2+ VIEW COMPARISON:  None Available. FINDINGS: Comminuted and displaced proximal humerus fracture. The dominant fracture plane is through the anatomic neck. There is mild apex medial angulation. Glenohumeral dislocation. Acromioclavicular alignment is normal. Generalized soft tissue edema. IMPRESSION: Comminuted displaced proximal humerus fracture. Electronically Signed   By: Narda Rutherford M.D.   On: 02/14/2024  20:46    Assessment and Plan  Comminuted impacted fracture of the right humeral head with intramuscular hemorrhage Orthopedics consulted.  Keep n.p.o. after midnight and continue pain management.  He is not on anticoagulation or antiplatelet agents.  Mild leukocytosis Likely reactive.  No infectious signs or symptoms.  Repeat CBC ordered.  History of esophagitis plus stenosis and spasms status post dilations-GJ tube dependent  Keep n.p.o./hold tube feeds at this time.  History of stroke Not on antiplatelet agents due to history of GI bleed.  Primary adrenal insufficiency Stress dose steroids during perioperative period.  Start IV hydrocortisone 25 mg every 8 hours.  Chronic normocytic anemia Hemoglobin stable and patient is not endorsing any symptoms of GI bleed at this time.  Non-insulin-dependent type 2 diabetes Last A1c 7.2 in August 2024.  Placed on sensitive sliding scale insulin every 4 hours as patient is currently NPO.  Seropositive rheumatoid arthritis  Receives monthly abatacept as outpatient.  DVT prophylaxis: SCDs Code Status: Full Code (discussed with the patient) Level of care: Med-Surg Admission status: It is my clinical opinion that admission to INPATIENT is reasonable and necessary because of the expectation that this patient will require hospital care that crosses at least 2 midnights to treat this condition based on the medical complexity of the problems presented.  Given the aforementioned information, the predictability of an adverse outcome is felt to be significant.   John Giovanni MD [facility]  If 7PM-7AM, please contact night-coverage [email or web address]  02/15/2024, 3:48 AM

## 2024-02-15 NOTE — Progress Notes (Signed)
Orthopedic Tech Progress Note Patient Details:  [name] 03/21/1946 [id number]  Ortho Devices Type of Ortho Device: Arm sling Ortho Device/Splint Location: RUE Ortho Device/Splint Interventions: Application   Post Interventions Patient Tolerated: Well  [name] [name] 02/15/2024, 10:00 AM

## 2024-02-16 DIAGNOSIS — Z931 Gastrostomy status: Secondary | ICD-10-CM

## 2024-02-16 DIAGNOSIS — Z7189 Other specified counseling: Secondary | ICD-10-CM

## 2024-02-16 DIAGNOSIS — Z515 Encounter for palliative care: Secondary | ICD-10-CM | POA: Diagnosis not present

## 2024-02-16 DIAGNOSIS — M069 Rheumatoid arthritis, unspecified: Secondary | ICD-10-CM

## 2024-02-16 DIAGNOSIS — S42209A Unspecified fracture of upper end of unspecified humerus, initial encounter for closed fracture: Secondary | ICD-10-CM | POA: Diagnosis not present

## 2024-02-16 DIAGNOSIS — W19XXXA Unspecified fall, initial encounter: Secondary | ICD-10-CM

## 2024-02-16 LAB — CBC
HCT: 30.3 % — ABNORMAL LOW (ref 39.0–52.0)
Hemoglobin: 9.7 g/dL — ABNORMAL LOW (ref 13.0–17.0)
MCH: 28.1 pg (ref 26.0–34.0)
MCHC: 32 g/dL (ref 30.0–36.0)
MCV: 87.8 fL (ref 80.0–100.0)
Platelets: 180 10*3/uL (ref 150–400)
RBC: 3.45 MIL/uL — ABNORMAL LOW (ref 4.22–5.81)
RDW: 17.4 % — ABNORMAL HIGH (ref 11.5–15.5)
WBC: 10.5 10*3/uL (ref 4.0–10.5)
nRBC: 0 % (ref 0.0–0.2)

## 2024-02-16 LAB — GLUCOSE, CAPILLARY
Glucose-Capillary: 106 mg/dL — ABNORMAL HIGH (ref 70–99)
Glucose-Capillary: 113 mg/dL — ABNORMAL HIGH (ref 70–99)
Glucose-Capillary: 155 mg/dL — ABNORMAL HIGH (ref 70–99)
Glucose-Capillary: 156 mg/dL — ABNORMAL HIGH (ref 70–99)
Glucose-Capillary: 162 mg/dL — ABNORMAL HIGH (ref 70–99)
Glucose-Capillary: 196 mg/dL — ABNORMAL HIGH (ref 70–99)

## 2024-02-16 MED ORDER — INSULIN ASPART 100 UNIT/ML IJ SOLN
0.0000 [IU] | Freq: Three times a day (TID) | INTRAMUSCULAR | Status: DC
  Administered 2024-02-16 – 2024-02-17 (×2): 2 [IU] via SUBCUTANEOUS
  Administered 2024-02-17 (×2): 3 [IU] via SUBCUTANEOUS
  Administered 2024-02-18 (×2): 5 [IU] via SUBCUTANEOUS
  Administered 2024-02-19: 3 [IU] via SUBCUTANEOUS
  Administered 2024-02-19: 11 [IU] via SUBCUTANEOUS
  Administered 2024-02-19: 5 [IU] via SUBCUTANEOUS

## 2024-02-16 MED ORDER — ATORVASTATIN CALCIUM 40 MG PO TABS
40.0000 mg | ORAL_TABLET | Freq: Every day | ORAL | Status: DC
  Administered 2024-02-16 – 2024-02-20 (×5): 40 mg
  Filled 2024-02-16 (×4): qty 1

## 2024-02-16 MED ORDER — AMLODIPINE BESYLATE 5 MG PO TABS: 5.0000 mg | ORAL_TABLET | Freq: Every day | ORAL | Status: DC

## 2024-02-16 MED ORDER — FERROUS SULFATE 300 (60 FE) MG/5ML PO SOLN: 300.0000 mg | Freq: Every day | ORAL | Status: DC

## 2024-02-16 MED ORDER — FOLIC ACID 1 MG PO TABS
1.0000 mg | ORAL_TABLET | Freq: Every day | ORAL | Status: DC
Start: 2024-02-16 — End: 2024-02-20
  Administered 2024-02-16 – 2024-02-20 (×5): 1 mg
  Filled 2024-02-16 (×4): qty 1

## 2024-02-16 MED ORDER — LEVOTHYROXINE SODIUM 50 MCG PO TABS
50.0000 ug | ORAL_TABLET | Freq: Every day | ORAL | Status: DC
  Administered 2024-02-17 – 2024-02-20 (×4): 50 ug
  Filled 2024-02-16 (×4): qty 1

## 2024-02-16 MED ORDER — OXYCODONE HCL 5 MG PO TABS
5.0000 mg | ORAL_TABLET | Freq: Four times a day (QID) | ORAL | Status: DC | PRN
  Administered 2024-02-18 – 2024-02-19 (×3): 5 mg
  Filled 2024-02-16 (×3): qty 1

## 2024-02-16 MED ORDER — FERROUS SULFATE 300 (60 FE) MG/5ML PO SOLN
300.0000 mg | Freq: Every day | ORAL | Status: DC
  Administered 2024-02-16 – 2024-02-20 (×5): 300 mg
  Filled 2024-02-16 (×5): qty 5

## 2024-02-16 MED ORDER — FLUDROCORTISONE ACETATE 0.1 MG PO TABS: 0.1000 mg | ORAL_TABLET | Freq: Every day | ORAL | Status: DC

## 2024-02-16 MED ORDER — AMLODIPINE BESYLATE 5 MG PO TABS
5.0000 mg | ORAL_TABLET | Freq: Every day | ORAL | Status: DC
  Administered 2024-02-16 – 2024-02-20 (×5): 5 mg
  Filled 2024-02-16 (×4): qty 1

## 2024-02-16 MED ORDER — OXYCODONE HCL 5 MG PO TABS
5.0000 mg | ORAL_TABLET | Freq: Four times a day (QID) | ORAL | Status: AC | PRN
  Administered 2024-02-19: 5 mg
  Filled 2024-02-16: qty 1

## 2024-02-16 MED ORDER — SUCRALFATE 1 GM/10ML PO SUSP
1.0000 g | Freq: Four times a day (QID) | ORAL | Status: DC
  Administered 2024-02-16 – 2024-02-20 (×17): 1 g
  Filled 2024-02-16 (×20): qty 10

## 2024-02-16 MED ORDER — LEVOTHYROXINE SODIUM 50 MCG PO TABS: 50.0000 ug | ORAL_TABLET | Freq: Every day | ORAL | Status: DC

## 2024-02-16 MED ORDER — PROSOURCE TF20 ENFIT COMPATIBL EN LIQD
60.0000 mL | Freq: Every day | ENTERAL | Status: DC
  Administered 2024-02-16 – 2024-02-20 (×5): 60 mL
  Filled 2024-02-16 (×5): qty 60

## 2024-02-16 MED ORDER — FLUDROCORTISONE ACETATE 0.1 MG PO TABS
0.1000 mg | ORAL_TABLET | Freq: Every day | ORAL | Status: DC
  Administered 2024-02-16 – 2024-02-20 (×5): 0.1 mg
  Filled 2024-02-16 (×5): qty 1

## 2024-02-16 MED ORDER — CITALOPRAM HYDROBROMIDE 10 MG PO TABS: 10.0000 mg | ORAL_TABLET | Freq: Every day | ORAL | Status: DC

## 2024-02-16 MED ORDER — PREDNISONE 5 MG PO TABS
5.0000 mg | ORAL_TABLET | Freq: Every day | ORAL | Status: DC
  Administered 2024-02-16 – 2024-02-20 (×5): 5 mg
  Filled 2024-02-16 (×4): qty 1

## 2024-02-16 MED ORDER — ACETAMINOPHEN 325 MG PO TABS
650.0000 mg | ORAL_TABLET | Freq: Four times a day (QID) | ORAL | Status: DC | PRN
  Administered 2024-02-16 – 2024-02-18 (×3): 650 mg
  Filled 2024-02-16 (×2): qty 2

## 2024-02-16 MED ORDER — FOLIC ACID 1 MG PO TABS: 1.0000 mg | ORAL_TABLET | Freq: Every day | ORAL | Status: DC

## 2024-02-16 MED ORDER — ATORVASTATIN CALCIUM 40 MG PO TABS: 40.0000 mg | ORAL_TABLET | Freq: Every day | ORAL | Status: DC

## 2024-02-16 MED ORDER — FAMOTIDINE 40 MG/5ML PO SUSR
40.0000 mg | Freq: Two times a day (BID) | ORAL | Status: DC
  Administered 2024-02-16 (×2): 40 mg
  Filled 2024-02-16 (×3): qty 5

## 2024-02-16 MED ORDER — OSMOLITE 1.5 CAL PO LIQD
1000.0000 mL | ORAL | Status: DC
  Administered 2024-02-16 – 2024-02-19 (×6): 1000 mL
  Filled 2024-02-16 (×5): qty 1000

## 2024-02-16 MED ORDER — CITALOPRAM HYDROBROMIDE 10 MG PO TABS
10.0000 mg | ORAL_TABLET | Freq: Every day | ORAL | Status: DC
  Administered 2024-02-16 – 2024-02-20 (×5): 10 mg
  Filled 2024-02-16 (×5): qty 1

## 2024-02-16 MED ORDER — PREDNISONE 5 MG PO TABS: 5.0000 mg | ORAL_TABLET | Freq: Every day | ORAL | Status: DC

## 2024-02-16 NOTE — Evaluation (Signed)
Physical Therapy Evaluation Patient Details Name: [name] MRN: [id number] DOB: 1946/03/26 Today's Date: 02/16/2024  History of Present Illness  Pt is a 78 y.o. M who presents 02/14/2024 with right humeral head fx after a fall; recommendation of nonoperative management. 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  Clinical Impression  Pt admitted with above. Pt presents with impaired cognition, sitting/standing balance, weakness, and decreased functional use of RUE. Pt A&O to self only. Pt requiring moderate assist for bed mobility and minimal assist (+2 safety) for transfers to the chair. Presents as a high fall risk based on deficits listed above, history of falls, and decreased safety awareness. Patient will benefit from continued inpatient follow up therapy, <3 hours/day.        If plan is discharge home, recommend the following: A lot of help with walking and/or transfers;A lot of help with bathing/dressing/bathroom   Can travel by private vehicle   No    Equipment Recommendations BSC/3in1;Wheelchair (measurements PT);Wheelchair cushion (measurements PT)  Recommendations for Other Services       Functional Status Assessment Patient has had a recent decline in their functional status and demonstrates the ability to make significant improvements in function in a reasonable and predictable amount of time.     Precautions / Restrictions Precautions Precautions: Fall Recall of Precautions/Restrictions: Impaired Precaution/Restrictions Comments: Posey Required Braces or  Orthoses: Sling Restrictions Weight Bearing Restrictions Per Provider Order: Yes RUE Weight Bearing Per Provider Order: Non weight bearing      Mobility  Bed Mobility Overal bed mobility: Needs Assistance Bed Mobility: Supine to Sit     Supine to sit: Mod assist     General bed mobility comments: ModA for trunk control to sit upright    Transfers Overall transfer level: Needs assistance Equipment used: None Transfers: Sit to/from Stand, Bed to chair/wheelchair/BSC Sit to Stand: Min assist, From elevated surface Stand pivot transfers: Min assist, +2 safety/equipment         General transfer comment: MinA to power up to standing and steady, increased tremulousness from bed to chair, pivoting towards left    Ambulation/Gait                  Stairs            Wheelchair Mobility     Tilt Bed    Modified Rankin (Stroke Patients Only)       Balance Overall balance assessment: Needs assistance Sitting-balance support: Feet supported Sitting balance-Leahy Scale: Poor Sitting balance - Comments: Posterior LOB, requiring min-modA for correction   Standing balance support: During functional activity, No upper extremity supported Standing balance-Leahy Scale: Poor Standing balance comment: requires external support from therapist                             Pertinent Vitals/Pain Pain Assessment Pain Assessment: Faces Faces Pain Scale: Hurts even more Pain Location: RUE Pain Descriptors / Indicators: Grimacing, Guarding, Tender Pain Intervention(s): Limited activity within patient's tolerance, Monitored during session, Repositioned    Home Living Family/patient expects to be discharged to:: Private residence Living Arrangements: Alone Available Help at  Discharge: Friend(s);Available PRN/intermittently (friend [name] lives close by, sons live in [place]) Type of Home: House Home Access: Stairs to enter   Secretary/administrator of  Steps: 3   Home Layout: One level Home Equipment: Agricultural consultant (2 wheels) Additional Comments: Unclear if home set up is accurate due to pt confusion    Prior Function Prior Level of Function : Needs assist             Mobility Comments: ambulatory with RW per pt and chart review ADLs Comments: unsure     Extremity/Trunk Assessment   Upper Extremity Assessment Upper Extremity Assessment: Defer to OT evaluation    Lower Extremity Assessment Lower Extremity Assessment: Generalized weakness    Cervical / Trunk Assessment Cervical / Trunk Assessment: Normal  Communication   Communication Communication: No apparent difficulties    Cognition Arousal: Alert Behavior During Therapy: WFL for tasks assessed/performed   PT - Cognitive impairments: No family/caregiver present to determine baseline, Orientation, Awareness, Memory, Attention, Problem solving, Safety/Judgement   Orientation impairments: Place, Time, Situation                   PT - Cognition Comments: Pt frequently stating he is at his sister's house and cannot recall he is in the hospital despite re-orientation. Pt reporting month is November. Pt RN reporting pt combative yesterday Following commands: Intact       Cueing       General Comments      Exercises     Assessment/Plan    PT Assessment Patient needs continued PT services  PT Problem List Decreased strength;Decreased activity tolerance;Decreased balance;Decreased mobility;Decreased cognition;Decreased safety awareness;Pain       PT Treatment Interventions DME instruction;Gait training;Functional mobility training;Therapeutic activities;Therapeutic exercise;Balance training;Patient/family education    PT Goals (Current goals can be found in the Care Plan section)  Acute Rehab PT Goals Patient Stated Goal: did not state PT Goal Formulation: With patient Time For Goal Achievement: 03/01/24 Potential to Achieve Goals: Fair     Frequency Min 1X/week     Co-evaluation               AM-PAC PT "6 Clicks" Mobility  Outcome Measure Help needed turning from your back to your side while in a flat bed without using bedrails?: A Little Help needed moving from lying on your back to sitting on the side of a flat bed without using bedrails?: A Lot Help needed moving to and from a bed to a chair (including a wheelchair)?: A Little Help needed standing up from a chair using your arms (e.g., wheelchair or bedside chair)?: A Little Help needed to walk in hospital room?: Total Help needed climbing 3-5 steps with a railing? : Total 6 Click Score: 13    End of Session Equipment Utilized During Treatment: Gait belt;Other (comment) (sling) Activity Tolerance: Patient tolerated treatment well Patient left: in chair;with call bell/phone within reach;with chair alarm set;with restraints reapplied Nurse Communication: Mobility status PT Visit Diagnosis: Unsteadiness on feet (R26.81);Muscle weakness (generalized) (M62.81);History of falling (Z91.81);Pain Pain - Right/Left: Right Pain - part of body: Arm    Time: 1610-9604 PT Time Calculation (min) (ACUTE ONLY): 23 min   Charges:   PT Evaluation $PT Eval Low Complexity: 1 Low PT Treatments $Therapeutic Activity: 8-22 mins PT General Charges $$ ACUTE PT VISIT: 1 Visit         Lillia Pauls, PT, DPT Acute Rehabilitation Services Office [phone]   Norval Morton 02/16/2024, 1:05 PM

## 2024-02-16 NOTE — Consult Note (Signed)
Palliative Care Consult Note                                  Date: 02/16/2024   Patient Name: [name]  DOB: Feb 08, 1946  MRN: [id number]  Age / Sex: 78 y.o., male  PCP: Dennis Kin, MD Referring Physician: Noralee Stain, DO  Reason for Consultation: Establishing goals of care  HPI/Patient Profile: 78 y.o. 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 He presented to the ED on 02/14/2024 with right shoulder pain after mechanical fall.  He was found to have right humeral head fracture.  Orthopedic surgery consulted and recommended nonoperative management.  Palliative Medicine has been consulted for goals of care and medical decision making.  Subjective:   Extensive chart review has been completed including labs, vital signs, imaging, progress/consult notes, orders, medications and available advance directive documents.   I assessed patient at bedside. He is oriented to person and place. He is able to tell me he is in the hospital because he fell and injured his arm. He deflects any/all questions related to his current medical situation and underlying co-morbidities. He does not recall recently speaking with [facility] outpatient palliative. 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   I later spoke with his ex-wife/[name] by phone to discuss diagnosis, prognosis, GOC, and disposition.    Patient is known to PMT from previous hospitalizations. I re-introduced Palliative Medicine as specialized medical care for people  living with serious illness. It focuses on providing relief from the symptoms and stress of a serious illness.   Created space and opportunity for patient and family to express thoughts and feelings regarding current medical situation. Values and goals of care were attempted to be elicited.  Social History: Patient and [name] share 2 sons. He has lived with [name] since January 2024, in the setting of his declining health.   Functional Status: At baseline, he is able to walk from the bedroom to the living area. He has refused to use a walker. He watches TV most of the day.   GOC Discussion: We discussed patient's current illness and what it means in the larger context of his ongoing co-morbidities. Current clinical status was reviewed. Natural disease trajectory of chronic illness was discussed.   [name] has a good understanding of patient's current clinical condition, stating he has "a lot of medical problems". She reports that he has declined in the past 6-12 months.   We reviewed the recent discussion with [facility] outpatient palliative. [name] reported that patient was "lucid" during this discussion, and that he agreed to hospice services.  We reviewed PT/OT has evaluated patient and is recommending rehab. [name] agrees that patient should go to rehab, prior to coming back home. She reports she has already been in communication with someone from hospice (she is not sure which agency) about starting services when patient returns home.   Discussed code status. I encouraged consideration of DNR/DNI status and provided education on evidence-based poor outcomes in similar hospitalized patients, as the cause of cardiac arrest would  likely be associated with advanced illness rather than a reversible condition. [name] reports that patient has previously stated "do whatever to keep me alive". I provided education that this is not consistent with hospice philosophy. We also discussed the limitations of  medical interventions that prolong quality of life when the body fails to thrive. [name] verbalizes understanding, and ask for this to be discussed with patient's sons    Review of Systems  Unable to perform ROS   Objective:   Primary Diagnoses: Present on Admission:  Humerus fracture  Rheumatoid arthritis (HCC)   Physical Exam Vitals reviewed.  Constitutional:      General: He is not in acute distress.    Comments: Chronically ill-appearing  Pulmonary:     Effort: Pulmonary effort is normal.  Neurological:     Mental Status: He is alert. He is confused.  Psychiatric:        Thought Content: Thought content is delusional.        Cognition and Memory: Memory is impaired.    Palliative Assessment/Data: PPS 40%     Assessment & Plan:   SUMMARY OF RECOMMENDATIONS   Continue current scope of care Patient recently spoke with [facility] outpatient palliative and reportedly (per ex-wife) agreed to home hospice services - plan is for rehab first I will reach out to patient's sons tomorrow  Primary Decision Maker: Patient does not seem to have insight into his current medical situation Next of Johnson are patient's 2 sons  Code Status/Advance Care Planning: Full code  Prognosis:  Unable to determine  Discharge Planning:  To Be Determined     Thank you for allowing [place] to participate in the care of [name]   Time Total: 75 minutes  Detailed review of medical records (labs, imaging, vital signs), medically appropriate exam, discussed with treatment team, counseling and education to patient, family, & staff, documenting clinical information, coordination of care.   Signed by: Sherlean Foot, NP Palliative Medicine Team  Team Phone # [phone]  For individual providers, please see AMION

## 2024-02-16 NOTE — Progress Notes (Addendum)
Initial Nutrition Assessment DOCUMENTATION CODES:  Severe malnutrition in context of chronic illness (cancer, PEG dependence)  INTERVENTION:   Initiate tube feeding via PEG/J: Osmolite 1.5 at 85 ml/h x 18 hr (4pm-10am) (1530 ml per day)  60 mL Prosource TF20 1x/day  Provides 2375 kcal, 116 gm protein, 2066 ml free water daily  free water flush- 6x/day  NUTRITION DIAGNOSIS:  Severe Malnutrition related to chronic illness (cancer, PEG dependence) as evidenced by severe muscle depletion, severe fat depletion, energy intake < 75% for > or equal to 1 month.  GOAL:  Patient will meet greater than or equal to 90% of their needs  MONITOR:  Diet advancement, TF tolerance, PO intake, Weight trends  REASON FOR ASSESSMENT:  Consult Enteral/tube feeding initiation and management, Assessment of nutrition requirement/status  ASSESSMENT:  Pt with hx of SCC on tongue s/p radiation and chemotherapy, esophagitis, CVA s/p left stent (12/2022), T2DM, GERD, HTN, and dyslipidemia. Pt admitted with right shoulder pain after fall and hitting his head, diagnosed with humeral head fx and currently has PEG tube.  No current plans to operate on fx, pt being followed by orthopedic surgery and will have PT/OT during admission. Pain management during stay initiated. Tube feeding will be initiated after RD consult complete. Pt currently has PEG tube in place d/t hx of SCC of tongue and esophagitis.  Spoke with pt who was awake and fairly alert. Pt able to answer most questions and RN present at time of interview to help with questions as well. Pt reports he ate half of a pancake this morning and some banana, but regurgitated it. Per talk with MD, she reports pt recently had esophageal stenosis with a stent that was recently removed so he has been relying on tube feedings for nutrition. Pt currently has PEG tube in place for nocturnal feeds (PTA), but will be utilized continuously to meet needs while admitted.  MD ordered clear liquid diet to continue appropriate partial PO intake.  PTA, pt reports he was eating small amounts PO during the day of soft solid food like cheese sandwiches, ice cream, and chocolate milk. He relied on PEG tube for nocturnal feeds (7pm-7am) while at home.   Pt reports recent weight loss, but could not estimate amount or timeframe of weight loss. Last wt 72kg from 04/2023 admission, discussed with RN to obtain standing wt when pt transferred back to bed.  Pt reports he is able to get up and walk around un-assisted at home PTA. Pt sitting up right in bedside chair at time of interview.  Discussed feeding tube regimen and initiating feeding to meet his needs.   Medications reviewed and include: amlodipine, atorvastatin Famotidine, ferrous sulfate (1x/day), folic acid (1x/day), free water every 4 hours, insulin aspart  Labs reviewed: CBG- 196, BUN 29, Hgb 9.7  NUTRITION - FOCUSED PHYSICAL EXAM: Flowsheet Row Most Recent Value  Orbital Region Severe depletion  Upper Arm Region Severe depletion  Thoracic and Lumbar Region Severe depletion  Buccal Region Severe depletion  Temple Region Severe depletion  Clavicle Bone Region Severe depletion  Clavicle and Acromion Bone Region Severe depletion  Scapular Bone Region Severe depletion  Dorsal Hand Severe depletion  [pt's hands stiff and clenched]  Patellar Region Moderate depletion  Anterior Thigh Region Moderate depletion  Posterior Calf Region Moderate depletion  Edema (RD Assessment) None  Hair Reviewed  Eyes Reviewed  Mouth Reviewed  [missing teeth on right side]  Skin Reviewed  [bruising on arms]  Nails Reviewed  Diet Order:   Diet Order             Diet clear liquid Fluid consistency: Thin  Diet effective now                  EDUCATION NEEDS:  Education needs have been addressed  Skin:  Skin Assessment: Reviewed RN Assessment  Last BM:  per pt, last bm 2/18 PTA  Height:  Ht Readings from  Last 1 Encounters:  01/06/23 6\' 2"  (1.88 m)   Weight:  Wt Readings from Last 1 Encounters:  05/23/23 72 kg   Ideal Body Weight:  86.4 kg  BMI:  There is no height or weight on file to calculate BMI.  Estimated Nutritional Needs:   Kcal:  2100-2300  Protein:  105-120g  Fluid:  >2L  Louis Meckel Dietetic Intern

## 2024-02-16 NOTE — Plan of Care (Signed)
  Problem: Education: Goal: Ability to describe self-care measures that may prevent or decrease complications (Diabetes Survival Skills Education) will improve Outcome: Progressing Goal: Individualized Educational Video(s) Outcome: Progressing   Problem: Coping: Goal: Ability to adjust to condition or change in health will improve Outcome: Progressing   Problem: Fluid Volume: Goal: Ability to maintain a balanced intake and output will improve Outcome: Progressing   Problem: Health Behavior/Discharge Planning: Goal: Ability to identify and utilize available resources and services will improve Outcome: Progressing Goal: Ability to manage health-related needs will improve Outcome: Progressing   Problem: Metabolic: Goal: Ability to maintain appropriate glucose levels will improve Outcome: Progressing   Problem: Nutritional: Goal: Maintenance of adequate nutrition will improve Outcome: Progressing Goal: Progress toward achieving an optimal weight will improve Outcome: Progressing   Problem: Tissue Perfusion: Goal: Adequacy of tissue perfusion will improve Outcome: Progressing   Problem: Skin Integrity: Goal: Risk for impaired skin integrity will decrease Outcome: Progressing

## 2024-02-16 NOTE — Progress Notes (Addendum)
PROGRESS NOTE    [name]  UYQ:[id number] DOB: [birth year] DOA: 02/14/2024 PCP: Lorrine Kin, MD     Brief Narrative:  [name] is a 78 y.o. 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  He was found to have right humeral head fracture.  Orthopedic surgery consulted, recommended nonoperative management.  After admission, patient exhibited episodes of confusion and agitation.  New events last 24 hours / Subjective: Patient alert and oriented to self only but not to place, calm and appropriate in the mood.  He is able to tell me that he is in the hospital due to a fall at home causing right upper extremity fracture.  Assessment & Plan:   Principal Problem:   Humerus fracture Active Problems:   Rheumatoid arthritis (HCC)   Non-insulin dependent type 2 diabetes mellitus (HCC)   PEG (percutaneous endoscopic gastrostomy) status (HCC)   Adrenal insufficiency (HCC)   Chronic anemia   Right humeral head fracture after mechanical fall at home -Orthopedic surgery consulted, plan for nonoperative management -Nonweightbearing to right upper extremity -PT OT  History of esophagitis and stenosis with spasms status post dilatation -PEG tube dependent. Continue clear liquid diet PO  -Tube feedings -Pepcid, Carafate  History of stroke -Not on antiplatelet agents due to history of GI bleed  Primary adrenal insufficiency -Florinef, prednisone  Non-insulin-dependent  diabetes -Sliding scale insulin  Hypertension -Norvasc  Hyperlipidemia -Lipitor  Hypothyroidism -Synthroid  Seropositive rheumatoid arthritis -Abatacept monthly, methotrexate   Dementia/cognitive impairment -Risk of sundowning -Delirium precautions   Goals of care -Per outpatient palliative care note 02/14/2024, patient has experienced decline in overall health and mental status over the past 6 to 12 months with increasing delusions and inability to remember recent events.  Family was concerned regarding patient's increasing need for medical care and support.  Reported frequent falls at that time.  During that visit, they recommended referral to local hospice agency -Palliative care consult   DVT prophylaxis:  SCDs Start: 02/15/24 0543  Code Status: Full Family Communication: Discussed with ex-wife Debbie over the phone  Disposition Plan: PT OT pending  Status is: Inpatient Remains inpatient appropriate because: PT OT Palliative consult pending     Antimicrobials:  Anti-infectives (From admission, onward)    None        Objective: Vitals:   02/15/24 1251 02/15/24 1450 02/16/24 0502 02/16/24 0751  BP: 120/70 137/69 (!) 123/52 (!) 120/55  Pulse: 77 81 80 67  Resp: 16 18 18 15   Temp:  98 F (36.7 C) (!) 97.3 F (36.3 C) (!) 97.5 F (36.4 C)  TempSrc:  Oral Oral Oral  SpO2: 97% 97% 97% 94%   No intake or output data in the 24 hours ending 02/16/24 1222 There were no vitals filed for this visit.  Examination:  General exam: Appears calm and comfortable  Respiratory system: Clear to auscultation. Respiratory effort normal. No respiratory distress. No conversational dyspnea.  Cardiovascular system: S1 & S2 heard, RRR. No murmurs. No pedal edema. Gastrointestinal system: Abdomen is nondistended, soft and nontender. Normal bowel sounds heard. Central nervous  system: Alert and oriented to self only Extremities: Bruising and swelling of the right upper  extremity    Data Reviewed: I have personally reviewed following labs and imaging studies  CBC: Recent Labs  Lab 02/14/24 2016 02/16/24 0456  WBC 13.0* 10.5  NEUTROABS 11.6*  --   HGB 10.1* 9.7*  HCT 31.9* 30.3*  MCV 89.4 87.8  PLT 185 180   Basic Metabolic Panel: Recent Labs  Lab 02/14/24 2016  NA 140  K 4.7  CL 100  CO2 27  GLUCOSE 197*  BUN 29*  CREATININE 1.00  CALCIUM 9.0   GFR: CrCl cannot be calculated (Unknown ideal weight.). Liver Function Tests: Recent Labs  Lab 02/14/24 2016  AST 25  ALT 18  ALKPHOS 83  BILITOT 0.6  PROT 6.2*  ALBUMIN 3.1*   No results for input(s): "LIPASE", "AMYLASE" in the last 168 hours. No results for input(s): "AMMONIA" in the last 168 hours. Coagulation Profile: No results for input(s): "INR", "PROTIME" in the last 168 hours. Cardiac Enzymes: No results for input(s): "CKTOTAL", "CKMB", "CKMBINDEX", "TROPONINI" in the last 168 hours. BNP (last 3 results) No results for input(s): "PROBNP" in the last 8760 hours. HbA1C: Recent Labs    02/15/24 0624  HGBA1C 7.2*   CBG: Recent Labs  Lab 02/16/24 0035 02/16/24 0431 02/16/24 0801 02/16/24 0816 02/16/24 1119  GLUCAP 156* 106* 155* 196* 162*   Lipid Profile: No results for input(s): "CHOL", "HDL", "LDLCALC", "TRIG", "CHOLHDL", "LDLDIRECT" in the last 72 hours. Thyroid Function Tests: No results for input(s): "TSH", "T4TOTAL", "FREET4", "T3FREE", "THYROIDAB" in the last 72 hours. Anemia Panel: No results for input(s): "VITAMINB12", "FOLATE", "FERRITIN", "TIBC", "IRON", "RETICCTPCT" in the last 72 hours. Sepsis Labs: No results for input(s): "PROCALCITON", "LATICACIDVEN" in the last 168 hours.  No results found for this or any previous visit (from the past 240 hours).    Radiology Studies: CT Shoulder Right Wo Contrast Result Date: 02/15/2024 CLINICAL DATA:  Right shoulder injury, fracture EXAM: CT OF THE UPPER RIGHT EXTREMITY WITHOUT CONTRAST TECHNIQUE:  Multidetector CT imaging of the upper right extremity was performed according to the standard protocol. RADIATION DOSE REDUCTION: This exam was performed according to the departmental dose-optimization program which includes automated exposure control, adjustment of the mA and/or kV according to patient size and/or use of iterative reconstruction technique. COMPARISON:  None Available. FINDINGS: Bones/Joint/Cartilage There is a comminuted, impacted fracture of the right humeral head with an oblique fracture involving the surgical neck of the humerus with lateral angulation and impaction with 2.5 cm override of the fracture fragments. The articular surface of the humeral head appears externally rotated but is still seated within the glenoid fossa. Greater and lesser tuberosities appear intact. Scapula and visualized clavicle are intact. Acromioclavicular joint space is preserved. Respiratory motion artifact slightly limits evaluation of the thoracic cage,, the visualized osseous structures appear intact. Ligaments Suboptimally assessed by CT. Muscles and Tendons There is heterogeneous attenuation and focal thinning of the supraspinatus tendon best seen on coronal image # 45/6 and disruption of this tendon is not excluded though not optimally visualized on this examination. Infraspinatus and subscapularis tendons appear intact. There is marked thickening and hyperdensity the deltoid and to a lesser extent the visualized triceps and peripheral left latissimus dorsi musculature in keeping with intramuscular hemorrhage. Soft tissues Right shoulder effusion is present. Advanced vascular calcifications are seen within the central right upper extremity arterial vasculature. Extensive subcutaneous edema within the visualized right upper extremity. IMPRESSION: 1. Comminuted, impacted fracture  of the right humeral head with an oblique fracture involving the surgical neck of the humerus with lateral angulation and impaction  with 2.5 cm override of the fracture fragments. The articular surface of the humeral head appears externally rotated but is still seated within the glenoid fossa. 2. Heterogeneous attenuation and focal thinning of the supraspinatus tendon. Disruption of this tendon is not excluded though not optimally visualized on this examination. This could be better assessed with MRI examination. 3. Marked thickening and hyperdensity the deltoid and to a lesser extent the visualized triceps and peripheral left latissimus dorsi musculature in keeping with intramuscular hemorrhage. 4. Right shoulder effusion. 5. Extensive subcutaneous edema within the visualized right upper extremity. 6. Advanced vascular calcifications within the central right upper extremity arterial vasculature. Electronically Signed   By: Helyn Numbers M.D.   On: 02/15/2024 00:18   CT Head Wo Contrast Result Date: 02/14/2024 CLINICAL DATA:  Head trauma, minor (Age >= 65y); Neck trauma (Age >= 65y) EXAM: CT HEAD WITHOUT CONTRAST CT CERVICAL SPINE WITHOUT CONTRAST TECHNIQUE: Multidetector CT imaging of the head and cervical spine was performed following the standard protocol without intravenous contrast. Multiplanar CT image reconstructions of the cervical spine were also generated. RADIATION DOSE REDUCTION: This exam was performed according to the departmental dose-optimization program which includes automated exposure control, adjustment of the mA and/or kV according to patient size and/or use of iterative reconstruction technique. COMPARISON:  None Available. FINDINGS: CT HEAD FINDINGS Brain: Cerebral ventricle sizes are concordant with the degree of cerebral volume loss. Patchy and confluent areas of decreased attenuation are noted throughout the deep and periventricular white matter of the cerebral hemispheres bilaterally, compatible with chronic microvascular ischemic disease. No evidence of large-territorial acute infarction. No parenchymal  hemorrhage. No mass lesion. No extra-axial collection. No mass effect or midline shift. No hydrocephalus. Basilar cisterns are patent. Vascular: No hyperdense vessel. Atherosclerotic calcifications are present within the cavernous internal carotid and vertebral arteries. Skull: No acute fracture or focal lesion. Sinuses/Orbits: Paranasal sinuses and mastoid air cells are clear. The orbits are unremarkable. Other: None. CT CERVICAL SPINE FINDINGS Alignment: Grade 1 anterolisthesis of C4 on C5. Skull base and vertebrae: Multilevel moderate severe degenerative changes of the spine. Associated severe osseous neural foraminal stenosis at the right C3-C4, C5-C6 levels. No severe osseous neural central canal stenosis. No acute fracture. No aggressive appearing focal osseous lesion or focal pathologic process. Soft tissues and spinal canal: No prevertebral fluid or swelling. No visible canal hematoma. Upper chest: Biapical pleural/pulmonary scarring. Emphysematous changes. Other: Atherosclerotic plaque of the carotid arteries within the neck. IMPRESSION: 1. No acute intracranial abnormality. 2. No acute displaced fracture or traumatic listhesis of the cervical spine. 3. Multilevel moderate severe degenerative changes of the spine. Associated severe osseous neural foraminal stenosis at the right C3-C4, C5-C6 levels. Electronically Signed   By: Tish Frederickson M.D.   On: 02/14/2024 21:42   CT Cervical Spine Wo Contrast Result Date: 02/14/2024 CLINICAL DATA:  Head trauma, minor (Age >= 65y); Neck trauma (Age >= 65y) EXAM: CT HEAD WITHOUT CONTRAST CT CERVICAL SPINE WITHOUT CONTRAST TECHNIQUE: Multidetector CT imaging of the head and cervical spine was performed following the standard protocol without intravenous contrast. Multiplanar CT image reconstructions of the cervical spine were also generated. RADIATION DOSE REDUCTION: This exam was performed according to the departmental dose-optimization program which includes  automated exposure control, adjustment of the mA and/or kV according to patient size and/or use of iterative reconstruction technique. COMPARISON:  None Available.  FINDINGS: CT HEAD FINDINGS Brain: Cerebral ventricle sizes are concordant with the degree of cerebral volume loss. Patchy and confluent areas of decreased attenuation are noted throughout the deep and periventricular white matter of the cerebral hemispheres bilaterally, compatible with chronic microvascular ischemic disease. No evidence of large-territorial acute infarction. No parenchymal hemorrhage. No mass lesion. No extra-axial collection. No mass effect or midline shift. No hydrocephalus. Basilar cisterns are patent. Vascular: No hyperdense vessel. Atherosclerotic calcifications are present within the cavernous internal carotid and vertebral arteries. Skull: No acute fracture or focal lesion. Sinuses/Orbits: Paranasal sinuses and mastoid air cells are clear. The orbits are unremarkable. Other: None. CT CERVICAL SPINE FINDINGS Alignment: Grade 1 anterolisthesis of C4 on C5. Skull base and vertebrae: Multilevel moderate severe degenerative changes of the spine. Associated severe osseous neural foraminal stenosis at the right C3-C4, C5-C6 levels. No severe osseous neural central canal stenosis. No acute fracture. No aggressive appearing focal osseous lesion or focal pathologic process. Soft tissues and spinal canal: No prevertebral fluid or swelling. No visible canal hematoma. Upper chest: Biapical pleural/pulmonary scarring. Emphysematous changes. Other: Atherosclerotic plaque of the carotid arteries within the neck. IMPRESSION: 1. No acute intracranial abnormality. 2. No acute displaced fracture or traumatic listhesis of the cervical spine. 3. Multilevel moderate severe degenerative changes of the spine. Associated severe osseous neural foraminal stenosis at the right C3-C4, C5-C6 levels. Electronically Signed   By: Tish Frederickson M.D.   On:  02/14/2024 21:42   DG Chest Port 1 View Result Date: 02/14/2024 CLINICAL DATA:  Fall. EXAM: PORTABLE CHEST 1 VIEW COMPARISON:  05/16/2023 FINDINGS: Stable heart size and mediastinal contours. Mild chronic elevation of right hemidiaphragm. Bibasilar atelectasis without confluent airspace disease. No pleural fluid or pneumothorax. Mild chronic rib deformity at the left lung apex. IMPRESSION: Bibasilar atelectasis. Electronically Signed   By: Narda Rutherford M.D.   On: 02/14/2024 20:49   DG Shoulder Right Result Date: 02/14/2024 CLINICAL DATA:  Pain after fall. EXAM: RIGHT SHOULDER - 2+ VIEW COMPARISON:  None Available. FINDINGS: Comminuted and displaced proximal humerus fracture. The dominant fracture plane is through the anatomic neck. There is mild apex medial angulation. Glenohumeral dislocation. Acromioclavicular alignment is normal. Generalized soft tissue edema. IMPRESSION: Comminuted displaced proximal humerus fracture. Electronically Signed   By: Narda Rutherford M.D.   On: 02/14/2024 20:46      Scheduled Meds:  amLODipine  5 mg Per Tube Daily   atorvastatin  40 mg Per Tube Daily   citalopram  10 mg Per Tube Daily   famotidine  40 mg Per Tube BID   ferrous sulfate  300 mg Per Tube Daily   fludrocortisone  0.1 mg Per Tube Daily   folic acid  1 mg Per Tube Daily   free water  200 mL Per Tube 6 X Daily   insulin aspart  0-9 Units Subcutaneous TID WC   levothyroxine  50 mcg Per Tube Q0600   predniSONE  5 mg Per Tube Daily   sucralfate  1 g Per Tube Q6H   Continuous Infusions:   LOS: 1 day   Time spent: 40 minutes   Noralee Stain, DO [facility] 02/16/2024, 12:22 PM   Available via Epic secure chat 7am-7pm After these hours, please refer to coverage provider listed on [email or web address]

## 2024-02-17 DIAGNOSIS — Z931 Gastrostomy status: Secondary | ICD-10-CM | POA: Diagnosis not present

## 2024-02-17 DIAGNOSIS — Z515 Encounter for palliative care: Secondary | ICD-10-CM | POA: Diagnosis not present

## 2024-02-17 DIAGNOSIS — S42209A Unspecified fracture of upper end of unspecified humerus, initial encounter for closed fracture: Secondary | ICD-10-CM | POA: Diagnosis not present

## 2024-02-17 DIAGNOSIS — M069 Rheumatoid arthritis, unspecified: Secondary | ICD-10-CM | POA: Diagnosis not present

## 2024-02-17 LAB — GLUCOSE, CAPILLARY
Glucose-Capillary: 207 mg/dL — ABNORMAL HIGH (ref 70–99)
Glucose-Capillary: 196 mg/dL — ABNORMAL HIGH (ref 70–99)
Glucose-Capillary: 205 mg/dL — ABNORMAL HIGH (ref 70–99)
Glucose-Capillary: 257 mg/dL — ABNORMAL HIGH (ref 70–99)

## 2024-02-17 MED ORDER — FAMOTIDINE 20 MG PO TABS
40.0000 mg | ORAL_TABLET | Freq: Two times a day (BID) | ORAL | Status: DC
  Administered 2024-02-17 – 2024-02-20 (×7): 40 mg
  Filled 2024-02-17 (×7): qty 2

## 2024-02-17 MED ORDER — ENOXAPARIN SODIUM 40 MG/0.4ML IJ SOSY
40.0000 mg | PREFILLED_SYRINGE | INTRAMUSCULAR | Status: DC
  Administered 2024-02-17 – 2024-02-19 (×3): 40 mg via SUBCUTANEOUS
  Filled 2024-02-17 (×3): qty 0.4

## 2024-02-17 NOTE — Evaluation (Signed)
Occupational Therapy Evaluation Patient Details Name: [name] MRN: [id number] DOB: August 25, 1946 Today's Date: 02/17/2024   History of Present Illness   Pt is a 78 y.o. M who presents 02/14/2024 with right humeral head fx after a fall; recommendation of nonoperative management. 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     Clinical Impressions Pt admitted for above and limited by problem list below.  Pt is lethargic and unable to provide clear history, per chart he was managing his ADLs, living with his ex-wife and ambulating with RW.  Today, pt oriented to self only; follows some simple commands inconsistently with increased time.  He requires total assist +2 to reposition bed into chair position, max assist to complete grooming and max-total assist for all other ADLs. Transfers not attempted this date.  Repositioned R UE on pillow to provide increased support. Based on performance today, believe patient will best benefit from continued OT services acutely and after dc at inpatient setting with <3hrs/day to optimize independence, safety with ADLs and mobility.       If plan is discharge home, recommend the following:   Two people to help with walking and/or transfers;Two people to help with bathing/dressing/bathroom;Supervision due to cognitive status;Direct supervision/assist for financial management;Direct supervision/assist for medications management;Assistance with cooking/housework;Assist for transportation     Functional Status Assessment   Patient has had a recent  decline in their functional status and demonstrates the ability to make significant improvements in function in a reasonable and predictable amount of time.     Equipment Recommendations   Other (comment) (defer)     Recommendations for Other Services         Precautions/Restrictions   Precautions Precautions: Fall Recall of Precautions/Restrictions: Impaired Precaution/Restrictions Comments: Posey, PEG (chronic); no shoulder ROM, okay for elbow,wrist and hand (per Charma Igo, PA-C 2/21) Required Braces or Orthoses: Sling Restrictions Weight Bearing Restrictions Per Provider Order: Yes RUE Weight Bearing Per Provider Order: Non weight bearing Other Position/Activity Restrictions: sling at all times     Mobility Bed Mobility Overal bed mobility: Needs Assistance             General bed mobility comments: +2 total assist to respotion in bed, total assist to pull foward into long sitting.  Pt declining EOB.    Transfers                   General transfer comment: deferred      Balance                                           ADL either performed or assessed with clinical judgement   ADL Overall ADL's : Needs assistance/impaired     Grooming: Moderate assistance;Wash/dry face;Bed level           Upper Body Dressing : Maximal assistance;Bed level   Lower Body Dressing: Total assistance;Bed level;+2 for physical assistance     Toilet Transfer Details (indicate cue type and reason): deferred         Functional mobility during ADLs:  Total assistance;+2 for physical assistance General ADL Comments: bed level only, pt repositioned into chair position     Vision   Additional Comments: pt preference to keep eyes closed, requires cueing to maintain alertness     Perception         Praxis         Pertinent Vitals/Pain Pain Assessment Pain Assessment: Faces Faces Pain Scale: Hurts even more Pain Location:  RUE Pain Descriptors / Indicators: Grimacing, Guarding Pain Intervention(s): Limited activity within patient's tolerance, Monitored during session, Repositioned     Extremity/Trunk Assessment Upper Extremity Assessment Upper Extremity Assessment: RUE deficits/detail;LUE deficits/detail;Difficult to assess due to impaired cognition RUE Deficits / Details: grossly 3/5 MMT, RA changes to hand RUE Coordination: decreased fine motor;decreased gross motor LUE Deficits / Details: pt with humerus fx, in sling.   Limited elbow, wrist hand movement due to pain and RA changes. LUE: Unable to fully assess due to pain;Unable to fully assess due to immobilization LUE Coordination: decreased fine motor;decreased gross motor   Lower Extremity Assessment Lower Extremity Assessment: Defer to PT evaluation       Communication Communication Communication: Impaired Factors Affecting Communication: Reduced clarity of speech   Cognition Arousal: Lethargic Behavior During Therapy: Flat affect Cognition: History of cognitive impairments, Cognition impaired   Orientation impairments: Place, Time, Situation Awareness: Intellectual awareness impaired, Online awareness impaired Memory impairment (select all impairments): Short-term memory, Working [employer] fast streamer, Non-declarative long-term memory, Geneticist, molecular long-term memory Attention impairment (select first level of impairment): Focused attention Executive functioning impairment (select all impairments): Initiation, Organization, Sequencing, Reasoning, Problem solving OT - Cognition Comments: pt oriented to self only, reports being at home. Follows simple commands inconsistently with increased time and mulitmodal cueing; lethargic and unable to maintain alertness.  Does reports "no" multiple times when encouraged to sit EOB.                 Following commands: Impaired Following commands impaired: Follows one step commands inconsistently, Follows one step  commands with increased time     Cueing  General Comments   Cueing Techniques: Verbal cues;Tactile cues;Visual cues;Gestural cues      Exercises     Shoulder Instructions      Home Living Family/patient expects to be discharged to:: Private residence Living Arrangements: Alone Available Help at Discharge: Friend(s);Available PRN/intermittently Type of Home: House Home Access: Stairs to enter [employer] of Steps: 3   Home Layout: One level               Home Equipment: Agricultural consultant (2 wheels)   Additional Comments: Unclear if home set up is accurate due to pt confusion      Prior Functioning/Environment Prior Level of Function : Needs assist;Patient poor historian/Family not available;History of Falls (last six months)             Mobility Comments: ambulatory with RW per pt and chart review ADLs Comments: per chart able to manage ADLs but pt does not report today    OT Problem List: Decreased strength;Decreased range of motion;Decreased activity tolerance;Impaired balance (sitting and/or standing);Decreased coordination;Decreased cognition;Decreased safety awareness;Decreased knowledge of use of DME or AE;Decreased knowledge of precautions;Impaired UE functional use;Pain   OT Treatment/Interventions: Self-care/ADL training;Therapeutic exercise;DME and/or AE instruction;Therapeutic activities;Patient/family education;Balance training;Cognitive remediation/compensation      OT Goals(Current goals can be found in the care plan section)   Acute Rehab OT Goals Patient Stated Goal: none stated OT Goal Formulation: Patient unable to participate in goal setting Time For  Goal Achievement: 03/02/24 Potential to Achieve Goals: Fair   OT Frequency:  Min 1X/week    Co-evaluation              AM-PAC OT "6 Clicks" Daily Activity     Outcome Measure Help from another person eating meals?: A Lot Help from another person taking care of personal  grooming?: A Lot Help from another person toileting, which includes using toliet, bedpan, or urinal?: Total Help from another person bathing (including washing, rinsing, drying)?: A Lot Help from another person to put on and taking off regular upper body clothing?: A Lot Help from another person to put on and taking off regular lower body clothing?: Total 6 Click Score: 10   End of Session Equipment Utilized During Treatment: Other (comment) (sling) Nurse Communication: Precautions;Mobility status  Activity Tolerance: Patient limited by lethargy;Patient limited by fatigue Patient left: in bed;with call bell/phone within reach;with bed alarm set;with restraints reapplied  OT Visit Diagnosis: Other abnormalities of gait and mobility (R26.89);Muscle weakness (generalized) (M62.81);History of falling (Z91.81);Pain;Other symptoms and signs involving cognitive function Pain - Right/Left: Right Pain - part of body: Shoulder;Arm                Time: 4098-1191 OT Time Calculation (min): 21 min Charges:  OT General Charges $OT Visit: 1 Visit OT Evaluation $OT Eval Moderate Complexity: 1 Mod  Barry Brunner, OT Acute Rehabilitation Services Office [phone] Secure Chat Preferred    Chancy Milroy 02/17/2024, 11:24 AM

## 2024-02-17 NOTE — Progress Notes (Signed)
PROGRESS NOTE    AKSHAY SPANG  ZOX:[id number] DOB: [birth year] DOA: 02/14/2024 PCP: Lorrine Kin, MD     Brief Narrative:  [name] is a 78 y.o. 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  He was found to have right humeral head fracture.  Orthopedic surgery consulted, recommended nonoperative management.  After admission, patient exhibited episodes of confusion and agitation.  New events last 24 hours / Subjective: Patient resting comfortably today.  Right upper extremity is in a sling.  Awaiting SNF placement.  Assessment & Plan:   Principal Problem:   Humerus fracture Active Problems:   Rheumatoid arthritis (HCC)   Non-insulin dependent type 2 diabetes mellitus (HCC)   PEG (percutaneous endoscopic gastrostomy) status (HCC)   Adrenal insufficiency (HCC)   Chronic anemia   Right humeral head fracture after mechanical fall at home -Orthopedic surgery consulted, plan for nonoperative management -Nonweightbearing to right upper extremity -PT OT recommending SNF  History of esophagitis and stenosis with spasms status post dilatation -PEG tube dependent. Continue clear liquid diet PO  -Tube feedings -Pepcid, Carafate  History of stroke -Not on antiplatelet agents due to history of GI bleed  Primary adrenal insufficiency -Florinef, prednisone  Non-insulin-dependent diabetes -Sliding scale  insulin  Hypertension -Norvasc  Hyperlipidemia -Lipitor  Hypothyroidism -Synthroid  Seropositive rheumatoid arthritis -Abatacept monthly, methotrexate   Dementia/cognitive impairment -Risk of sundowning -Delirium precautions   Goals of care -Per outpatient palliative care note 02/14/2024, patient has experienced decline in overall health and mental status over the past 6 to 12 months with increasing delusions and inability to remember recent events.  Family was concerned regarding patient's increasing need for medical care and support.  Reported frequent falls at that time.  During that visit, they recommended referral to local hospice agency -Palliative care consult   DVT prophylaxis:  SCDs Start: 02/15/24 0543  Code Status: Full Family Communication: Discussed with ex-wife Debbie over the phone 2/20 Disposition Plan: SNF Status is: Inpatient Remains inpatient appropriate because: SNF placement pending    Antimicrobials:  Anti-infectives (From admission, onward)    None        Objective: Vitals:   02/16/24 0751 02/16/24 1813 02/17/24 0500 02/17/24 0753  BP: (!) 120/55 124/80 112/81 112/61  Pulse: 67 92 74 73  Resp: 15 16  15   Temp: (!) 97.5 F (36.4 C) 98.6 F (37 C) (!) 97.2 F (36.2 C) 97.6 F (36.4 C)  TempSrc: Oral Oral Oral Oral  SpO2: 94% 95% 95% 97%   No intake or output data in the 24 hours ending 02/17/24 1207 There were no vitals filed for this visit.  Examination:  General exam: Appears calm and comfortable   Data Reviewed: I have personally reviewed following labs and imaging studies  CBC: Recent Labs  Lab 02/14/24 2016 02/16/24 0456  WBC 13.0* 10.5  NEUTROABS 11.6*  --   HGB 10.1* 9.7*  HCT 31.9* 30.3*  MCV 89.4 87.8  PLT 185 180   Basic Metabolic Panel: Recent Labs  Lab 02/14/24 2016  NA 140  K 4.7  CL 100  CO2 27  GLUCOSE 197*  BUN 29*  CREATININE 1.00  CALCIUM 9.0   GFR: CrCl cannot be calculated (Unknown ideal  weight.). Liver Function Tests: Recent Labs  Lab 02/14/24 2016  AST 25  ALT 18  ALKPHOS 83  BILITOT 0.6  PROT 6.2*  ALBUMIN 3.1*   No results for input(s): "LIPASE", "AMYLASE" in the last 168 hours. No results for input(s): "AMMONIA" in the last 168 hours. Coagulation Profile: No results for input(s): "INR", "PROTIME" in the last 168 hours. Cardiac Enzymes: No results for input(s): "CKTOTAL", "CKMB", "CKMBINDEX", "TROPONINI" in the last 168 hours. BNP (last 3 results) No results for input(s): "PROBNP" in the last 8760 hours. HbA1C: Recent Labs    02/15/24 0624  HGBA1C 7.2*   CBG: Recent Labs  Lab 02/16/24 0816 02/16/24 1119 02/16/24 1600 02/17/24 0752 02/17/24 1123  GLUCAP 196* 162* 113* 207* 196*   Lipid Profile: No results for input(s): "CHOL", "HDL", "LDLCALC", "TRIG", "CHOLHDL", "LDLDIRECT" in the last 72 hours. Thyroid Function Tests: No results for input(s): "TSH", "T4TOTAL", "FREET4", "T3FREE", "THYROIDAB" in the last 72 hours. Anemia Panel: No results for input(s): "VITAMINB12", "FOLATE", "FERRITIN", "TIBC", "IRON", "RETICCTPCT" in the last 72 hours. Sepsis Labs: No results for input(s): "PROCALCITON", "LATICACIDVEN" in the last 168 hours.  No results found for this or any previous visit (from the past 240 hours).    Radiology Studies: No results found.     Scheduled Meds:  amLODipine  5 mg Per Tube Daily   atorvastatin  40 mg Per Tube Daily   citalopram  10 mg Per Tube Daily   famotidine  40 mg Per Tube BID   feeding supplement (PROSource TF20)  60 mL Per Tube Daily   ferrous sulfate  300 mg Per Tube Daily   fludrocortisone  0.1 mg Per Tube Daily   folic acid  1 mg Per Tube Daily   free water  200 mL Per Tube 6 X Daily   insulin aspart  0-9 Units Subcutaneous TID WC   levothyroxine  50 mcg Per Tube Q0600   predniSONE  5 mg Per Tube Daily   sucralfate  1 g Per Tube Q6H   Continuous Infusions:  feeding supplement (OSMOLITE 1.5 CAL)        LOS: 2 days   Time spent: 20 minutes   Noralee Stain, DO [facility] 02/17/2024, 12:07 PM   Available via Epic secure chat 7am-7pm After these hours, please refer to coverage provider listed on [email or web address]

## 2024-02-17 NOTE — TOC Initial Note (Signed)
Transition of Care [facility]) - Initial/Assessment Note    Patient Details  Name: [name] MRN: [id number] Date of Birth: [birth year]  [facility]) CM/SW Contact:    Epifanio Lesches, RN Phone Number: 02/17/2024, 11:06 AM  Clinical Narrative:    Pt presents after a fall Suffered R humerous fx. Per ortho, non-operative management with sling and NWB. F/u with Dr. Carola Frost in 2 weeks.  Pt from home with ex-wife, caretaker. Has lived with ex-wife the past year. [name] is decision maker pt. Pt confused. RNCM received consult for possible SNF placement at time of discharge. RNCM spoke with patient's [name] regarding PT recommendation of SNF placement at time of discharge. Patient's son reported that patient's  ex -wife is currently unable to care for patient at their home given patient's current physical needs and fall risk. Pt's son expressed understanding of PT's recommendation and is agreeable to SNF placement at time of discharge. Patient's son reports preference for [facility] [employer] . RNCM discussed insurance authorization process and provided Medicare SNF ratings list. Son expressed being hopeful for rehab and for pt to get stronger and transition back to home. Once home son states they will explore hospice care for pt. No further questions reported at this time. RNCM to continue to follow and assist with discharge planning needs.             Expected Discharge Plan: Skilled Nursing Facility Barriers to Discharge: Continued Medical Work up   Patient Goals and CMS Choice     Choice offered to / list presented to : Adult Children      Expected Discharge Plan and Services   Discharge Planning Services: CM Consult                                          Prior Living Arrangements/Services   Lives with:: Other (Comment) (exwife) Patient language and need for interpreter reviewed:: Yes        Need for Family Participation in Patient Care: Yes  (Comment) Care giver support system in place?: Yes (comment)   Criminal Activity/Legal Involvement Pertinent to Current Situation/Hospitalization: No - Comment as needed  Activities of Daily Living      Permission Sought/Granted                  Emotional Assessment       Orientation: : Oriented to Self Alcohol / Substance Use: Not Applicable Psych Involvement: No (comment)  Admission diagnosis:  Fracture of bone [T14.8XXA] Humerus fracture [S42.309A] Fall, initial encounter [W19.XXXA] Patient Active Problem List   Diagnosis Date Noted   Humerus fracture 02/15/2024   Adrenal insufficiency (HCC) 02/15/2024   Chronic anemia 02/15/2024   FTT (failure to thrive) in adult 05/18/2023   Failure to thrive in adult 05/16/2023   History of pulmonary embolism 05/16/2023   Presence of internal carotid stent 01/28/2023   Primary adrenal insufficiency (HCC) 01/28/2023   Protein-calorie malnutrition, severe 01/21/2023   Excessive oral secretions 01/19/2023   PEG (percutaneous endoscopic gastrostomy) status (HCC) 01/17/2023   Long term current use of anticoagulant therapy 01/17/2023   Debility 01/17/2023   Advanced care planning/counseling discussion 01/17/2023   Antiplatelet or antithrombotic long-term use 01/15/2023   Anemia, posthemorrhagic, acute 01/15/2023   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/06/2023   Severe malnutrition (HCC) 12/30/2022  Sepsis without acute organ dysfunction (HCC) 12/28/2022   Influenza A 12/28/2022   History of CVA (cerebrovascular accident) 12/28/2022   Acute encephalopathy 12/27/2022   Acute esophagitis    Abdominal pain, epigastric    Closed left fibular fracture 08/22/2022   Reflux esophagitis 08/22/2022   Pressure injury of skin 08/20/2022   Upper GI bleed 08/19/2022   UGIB (upper gastrointestinal bleed) 08/18/2022   Pancytopenia (HCC)    Immunosuppression (HCC)    ILD (interstitial lung disease) (HCC)     Diarrhea    DIC (disseminated intravascular coagulation) (HCC)    AKI (acute kidney injury) (HCC)    COVID-19 01/26/2021   Rheumatoid arthritis (HCC) 07/22/2015   Mucositis due to chemotherapy 06/25/2015   Mucositis due to radiation therapy 06/25/2015   SBO (small bowel obstruction) (HCC) 05/29/2015   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   H/O drug therapy 05/12/2015   Abnormal mental state 04/22/2015   Fast heart beat 04/22/2015   Febrile 04/18/2015   Candida esophagitis (HCC) 04/16/2015   Dysphagia, pharyngoesophageal phase 04/16/2015   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   Fall 05/24/2014   Traumatic pneumothorax 05/23/2014   Non-insulin dependent type 2 diabetes mellitus (HCC) 05/01/2014   Perirectal abscess 07/31/2013   Hemorrhoids 07/31/2013   Lentigines 11/13/2012   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   Atypical nevus 08/29/2012   Malignant melanoma in situ (HCC) 08/29/2012   Polypharmacy 08/23/2012   Dyspnea 08/14/2012   Rheumatoid arthritis (HCC) 08/14/2012   HTN (hypertension) 08/14/2012   GERD (gastroesophageal reflux disease) 08/14/2012   Pulmonary emphysema (HCC) 08/14/2012   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   Duodenal ulcer with hemorrhage 10/15/2011   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 [place] [address] Phone: [phone] Fax: [phone]  [facility] [id number] - [place], [place] - [address] [place] [address] Phone: [phone] Fax: [phone]     Social Drivers of Health (SDOH) Social History: SDOH Screenings   Food Insecurity: No Food Insecurity (02/17/2024)  Housing: Unknown (02/17/2024)  Transportation Needs: No Transportation Needs (02/17/2024)  Utilities: Not At Risk (02/17/2024)  Financial Resource Strain: Low Risk  (02/02/2022)   Received from [facility], [facility]  Physical Activity: Inactive (02/02/2022)   Received from [facility], [facility]  Social Connections: Socially Isolated (02/02/2022)   Received from [facility], [facility]  Stress: No Stress Concern Present (02/02/2022)   Received from [facility], [facility]  Tobacco Use: Medium Risk (02/14/2024)   SDOH Interventions:     Readmission Risk Interventions    01/21/2023    9:13 AM  Readmission Risk Prevention Plan  Transportation Screening Complete  Medication Review (RN Care Manager) Complete  PCP or Specialist appointment within 3-5 days of discharge Complete  HRI or Home Care Consult Complete  SW Recovery Care/Counseling Consult Complete  Palliative Care Screening Not Applicable  Skilled Nursing Facility  Complete

## 2024-02-17 NOTE — [place] FL2 (Signed)
[place] MEDICAID FL2 LEVEL OF CARE FORM     IDENTIFICATION  Patient Name: [name] Birthdate: [birth year] Sex: male Admission Date (Current Location): 02/14/2024  [facility] and [place] Number:  Producer, television/film/video and Address:         Provider Number: [id number]  Attending Physician Name and Address:  Noralee Stain, DO  Relative Name and Phone Number:       Current Level of Care: Hospital Recommended Level of Care: Skilled Nursing Facility Prior Approval Number:    Date Approved/Denied:   PASRR Number: [id number]  Discharge Plan: SNF    Current Diagnoses: Patient Active Problem List   Diagnosis Date Noted   Humerus fracture 02/15/2024   Adrenal insufficiency (HCC) 02/15/2024   Chronic anemia 02/15/2024   FTT (failure to thrive) in adult 05/18/2023   Failure to thrive in adult 05/16/2023   History of pulmonary embolism 05/16/2023   Presence of internal carotid stent 01/28/2023   Primary adrenal insufficiency (HCC) 01/28/2023   Protein-calorie malnutrition, severe 01/21/2023   Excessive oral secretions 01/19/2023   PEG (percutaneous endoscopic gastrostomy) status (HCC) 01/17/2023   Long term current use of anticoagulant therapy 01/17/2023   Debility 01/17/2023   Advanced care planning/counseling discussion 01/17/2023   Antiplatelet or antithrombotic long-term use 01/15/2023   Anemia, posthemorrhagic, acute 01/15/2023   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/06/2023   Severe malnutrition (HCC) 12/30/2022   Sepsis without acute organ dysfunction (HCC) 12/28/2022   Influenza A 12/28/2022   History of CVA (cerebrovascular accident) 12/28/2022   Acute encephalopathy 12/27/2022   Acute esophagitis    Abdominal pain, epigastric    Closed left fibular fracture 08/22/2022   Reflux esophagitis 08/22/2022   Pressure injury of skin 08/20/2022   Upper GI bleed 08/19/2022   UGIB (upper gastrointestinal bleed) 08/18/2022    Pancytopenia (HCC)    Immunosuppression (HCC)    ILD (interstitial lung disease) (HCC)    Diarrhea    DIC (disseminated intravascular coagulation) (HCC)    AKI (acute kidney injury) (HCC)    COVID-19 01/26/2021   Rheumatoid arthritis (HCC) 07/22/2015   Mucositis due to chemotherapy 06/25/2015   Mucositis due to radiation therapy 06/25/2015   SBO (small bowel obstruction) (HCC) 05/29/2015   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   H/O drug therapy 05/12/2015   Abnormal mental state 04/22/2015   Fast heart beat 04/22/2015   Febrile 04/18/2015   Candida esophagitis (HCC) 04/16/2015   Dysphagia, pharyngoesophageal phase 04/16/2015   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   Fall 05/24/2014   Traumatic pneumothorax 05/23/2014   Non-insulin dependent type 2 diabetes mellitus (HCC) 05/01/2014   Perirectal abscess 07/31/2013   Hemorrhoids 07/31/2013   Lentigines 11/13/2012   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   Atypical nevus 08/29/2012   Malignant melanoma in situ (HCC) 08/29/2012   Polypharmacy 08/23/2012   Dyspnea 08/14/2012   Rheumatoid arthritis (HCC) 08/14/2012   HTN (hypertension) 08/14/2012   GERD (gastroesophageal reflux disease) 08/14/2012   Pulmonary emphysema (HCC) 08/14/2012   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 Receives tube  feeding.) Dressing Assistance: Maximum assistance     Functional Limitations Info  Sight, Hearing, Speech Sight Info: Adequate Hearing Info: Adequate Speech Info: Adequate    SPECIAL CARE FACTORS FREQUENCY  PT (By licensed PT), OT (By licensed OT)     PT Frequency: 5x/ week, evaluate and treat OT Frequency: 5x/ week, evaluate and treat            Contractures Contractures Info: Not present    Additional Factors Info  Code Status, Allergies Code Status Info: Full code Allergies Info: No Known Allergies           Current Medications (02/17/2024):  This is the current hospital active medication list Current Facility-Administered Medications  Medication Dose Route Frequency Provider Last Rate Last Admin   acetaminophen (TYLENOL) tablet 650 mg  650 mg Per Tube Q6H PRN Noralee Stain, DO   650 mg at 02/16/24 1822   amLODipine (NORVASC) tablet 5 mg  5 mg Per Tube Daily Noralee Stain, DO   5 mg at 02/17/24 7829   atorvastatin (LIPITOR) tablet 40 mg  40 mg Per Tube Daily Noralee Stain, DO   40 mg at 02/17/24 5621   citalopram (CELEXA) tablet 10 mg  10 mg Per Tube Daily Noralee Stain, DO   10 mg at 02/17/24 3086   famotidine (PEPCID) tablet 40 mg  40 mg Per Tube BID Noralee Stain, DO   40 mg at 02/17/24 5784   feeding supplement (OSMOLITE 1.5 CAL) liquid 1,000 mL  1,000 mL Per Tube Continuous Noralee Stain, DO   1,000 mL at 02/16/24 2010   feeding supplement (PROSource TF20) liquid 60 mL  60 mL Per Tube Daily Noralee Stain, DO   60 mL at 02/17/24 6962   ferrous sulfate 300 (60 Fe) MG/5ML syrup 300 mg  300 mg Per Tube Daily Noralee Stain, DO   300 mg at 02/17/24 9528   fludrocortisone (FLORINEF) tablet 0.1 mg  0.1 mg Per Tube Daily Noralee Stain, DO   0.1 mg at 02/17/24 4132   fluticasone (FLONASE) 50 MCG/ACT nasal spray 2 spray  2 spray Each Nare Daily PRN Noralee Stain, DO       folic acid (FOLVITE) tablet 1 mg  1 mg Per Tube Daily Noralee Stain, DO   1 mg at 02/17/24  4401   free water 200 mL  200 mL Per Tube 6 X Daily Noralee Stain, DO   200 mL at 02/17/24 0272   haloperidol lactate (HALDOL) injection 2 mg  2 mg Intravenous Q6H PRN Noralee Stain, DO       HYDROmorphone (DILAUDID) injection 1 mg  1 mg Intravenous Q4H PRN John Giovanni, MD   1 mg at 02/17/24 5366   insulin aspart (novoLOG) injection 0-9 Units  0-9 Units Subcutaneous TID WC Noralee Stain, DO   3 Units at 02/17/24 4403   levothyroxine (SYNTHROID) tablet 50 mcg  50 mcg Per Tube Q0600 Noralee Stain, DO   50 mcg at 02/17/24 4742   LORazepam (ATIVAN) injection 1 mg  1 mg Intravenous Q4H PRN Noralee Stain, DO   1 mg at 02/17/24 0443   naloxone [facility]) injection 0.4 mg  0.4 mg Intravenous PRN  John Giovanni, MD       oxyCODONE (Oxy IR/ROXICODONE) immediate release tablet 5 mg  5 mg Per Tube Q6H PRN Noralee Stain, DO       oxyCODONE (Oxy IR/ROXICODONE) immediate release tablet 5 mg  5 mg Per Tube Q6H PRN Noralee Stain, DO       predniSONE (DELTASONE) tablet 5 mg  5 mg Per Tube Daily Noralee Stain, DO   5 mg at 02/17/24 0960   sucralfate (CARAFATE) 1 GM/10ML suspension 1 g  1 g Per Tube Q6H Noralee Stain, DO   1 g at 02/17/24 4540     Discharge Medications: Please see discharge summary for a list of discharge medications.  Relevant Imaging Results:  Relevant Lab Results:   Additional Information SSN: [id number]  Epifanio Lesches, RN

## 2024-02-18 DIAGNOSIS — R627 Adult failure to thrive: Secondary | ICD-10-CM | POA: Diagnosis not present

## 2024-02-18 DIAGNOSIS — M069 Rheumatoid arthritis, unspecified: Secondary | ICD-10-CM | POA: Diagnosis not present

## 2024-02-18 DIAGNOSIS — S42209A Unspecified fracture of upper end of unspecified humerus, initial encounter for closed fracture: Secondary | ICD-10-CM | POA: Diagnosis not present

## 2024-02-18 DIAGNOSIS — Z515 Encounter for palliative care: Secondary | ICD-10-CM | POA: Diagnosis not present

## 2024-02-18 LAB — GLUCOSE, CAPILLARY
Glucose-Capillary: 280 mg/dL — ABNORMAL HIGH (ref 70–99)
Glucose-Capillary: 288 mg/dL — ABNORMAL HIGH (ref 70–99)
Glucose-Capillary: 303 mg/dL — ABNORMAL HIGH (ref 70–99)

## 2024-02-18 MED ORDER — INSULIN GLARGINE-YFGN 100 UNIT/ML ~~LOC~~ SOPN
5.0000 [IU] | PEN_INJECTOR | Freq: Every day | SUBCUTANEOUS | Status: DC
  Filled 2024-02-18: qty 3

## 2024-02-18 MED ORDER — INSULIN GLARGINE-YFGN 100 UNIT/ML ~~LOC~~ SOLN
5.0000 [IU] | Freq: Every day | SUBCUTANEOUS | Status: DC
  Administered 2024-02-18: 5 [IU] via SUBCUTANEOUS
  Filled 2024-02-18 (×2): qty 0.05

## 2024-02-18 NOTE — Progress Notes (Signed)
 Palliative Medicine Progress Note   Patient Name: [name]       Date: 02/18/2024 DOB: 12-13-1946  Age: 78 y.o. MRN#: [id number] Attending Physician: Noralee Stain, DO Primary Care Physician: Lorrine Kin, MD Admit Date: 02/14/2024    HPI/Patient Profile: 78 y.o. 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 He presented to the ED on 02/14/2024 with right shoulder pain after mechanical fall.  He was found to have right humeral head fracture.  Orthopedic surgery consulted and recommended nonoperative management.   Palliative Medicine has been consulted for goals of care and medical decision making.  Subjective: Chart reviewed including vital signs, labs, and progress notes.   Assessed patient at bedside. Update received from RN.  Patient is currently sleeping/lethargic.  He does not have any acute complaints.  He is not able to participate in meaningful discussion.  I discussed with RN my conversation with patient's son yesterday evening about DNR status.   Objective:  Physical Exam Vitals reviewed.  Constitutional:      General: He is not in acute distress.    Appearance: He is ill-appearing.  Pulmonary:     Effort: No respiratory distress.  Neurological:     Mental Status: He is lethargic.  Psychiatric:        Cognition and Memory: Memory is impaired.              Palliative Medicine Assessment & Plan    Assessment: Principal Problem:   Humerus fracture Active Problems:   Rheumatoid arthritis (HCC)   Non-insulin dependent type 2 diabetes mellitus (HCC)   PEG (percutaneous endoscopic gastrostomy) status (HCC)   Adrenal insufficiency (HCC)   Chronic anemia    Recommendations/Plan: Code status changed to DNR/DNI Plan is for rehab and then home with hospice ([facility])  PMT will continue to follow  Primary Decision Maker: 2 sons   Prognosis:  Unable to determine  Discharge Planning: SNF/rehab   Thank you for allowing the Palliative Medicine Team to assist in the care  of this patient.   Time: 25 minutes   Merry Proud, NP   Please contact Palliative Medicine Team phone at [phone] for questions and concerns.  For individual providers, please see AMION.

## 2024-02-18 NOTE — Plan of Care (Signed)
  Problem: Education: Goal: Individualized Educational Video(s) Outcome: Progressing   Problem: Health Behavior/Discharge Planning: Goal: Ability to manage health-related needs will improve Outcome: Progressing   Problem: Metabolic: Goal: Ability to maintain appropriate glucose levels will improve Outcome: Progressing   Problem: Nutritional: Goal: Maintenance of adequate nutrition will improve Outcome: Progressing Goal: Progress toward achieving an optimal weight will improve Outcome: Progressing

## 2024-02-18 NOTE — Progress Notes (Signed)
 PROGRESS NOTE    [name]  NWG:[id number] DOB: 10/09/1946 DOA: 02/14/2024 PCP: Lorrine Kin, MD     Brief Narrative:  [name] is a 78 y.o. 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  He was found to have right humeral head fracture.  Orthopedic surgery consulted, recommended nonoperative management.  After admission, patient exhibited episodes of confusion and agitation.  New events last 24 hours / Subjective: Patient oriented to self only.  Resting comfortably.  Awaiting SNF placement  Assessment & Plan:   Principal Problem:   Humerus fracture Active Problems:   Rheumatoid arthritis (HCC)   Non-insulin dependent type 2 diabetes mellitus (HCC)   PEG (percutaneous endoscopic gastrostomy) status (HCC)   Adrenal insufficiency (HCC)   Chronic anemia   Right humeral head fracture after mechanical fall at home -Orthopedic surgery consulted, plan for nonoperative management -Nonweightbearing to right upper extremity -PT OT recommending SNF  History of esophagitis and stenosis with spasms status post dilatation -PEG tube dependent. Continue clear liquid diet PO  -Tube feedings -Pepcid, Carafate  History of stroke -Not on antiplatelet agents due to history of GI bleed  Primary adrenal insufficiency -Florinef, prednisone  Non-insulin-dependent diabetes -Sliding scale insulin, semglee    Hypertension -Norvasc  Hyperlipidemia -Lipitor  Hypothyroidism -Synthroid  Seropositive rheumatoid arthritis -Abatacept monthly, methotrexate   Dementia/cognitive impairment -Risk of sundowning -Delirium precautions   Goals of care -Per outpatient palliative care note 02/14/2024, patient has experienced decline in overall health and mental status over the past 6 to 12 months with increasing delusions and inability to remember recent events.  Family was concerned regarding patient's increasing need for medical care and support.  Reported frequent falls at that time.  During that visit, they recommended referral to local hospice agency -Palliative care consult   DVT prophylaxis:  enoxaparin (LOVENOX) injection 40 mg Start: 02/17/24 1345 SCDs Start: 02/15/24 0543  Code Status: Full Family Communication: No family at bedside Disposition Plan: SNF Status is: Inpatient Remains inpatient appropriate because: SNF placement pending    Antimicrobials:  Anti-infectives (From admission, onward)    None        Objective: Vitals:   02/17/24 1549 02/17/24 2013 02/18/24 0318 02/18/24 0920  BP: 125/69 139/62 123/66 123/62  Pulse: 87 79 82 87  Resp: 16 16 16 18   Temp: 99 F (37.2 C) 98.4 F (36.9 C) 98.3 F (36.8 C) 98.8 F (37.1 C)  TempSrc: Oral Oral Oral   SpO2: 97% 97% 98%   Weight:   77.5 kg     Intake/Output Summary (Last 24 hours) at 02/18/2024 1242 Last data filed at 02/18/2024 0319 Gross per 24 hour  Intake --  Output 450 ml  Net -450 ml   Filed Weights   02/18/24 0318  Weight: 77.5 kg    Examination:  General exam: Appears calm and comfortable   Data Reviewed: I have personally reviewed following labs and imaging studies  CBC: Recent Labs  Lab 02/14/24 2016 02/16/24 0456  WBC 13.0* 10.5  NEUTROABS 11.6*  --   HGB 10.1* 9.7*  HCT  31.9* 30.3*  MCV 89.4 87.8  PLT 185 180   Basic Metabolic Panel: Recent Labs  Lab 02/14/24 2016  NA 140   K 4.7  CL 100  CO2 27  GLUCOSE 197*  BUN 29*  CREATININE 1.00  CALCIUM 9.0   GFR: CrCl cannot be calculated (Unknown ideal weight.). Liver Function Tests: Recent Labs  Lab 02/14/24 2016  AST 25  ALT 18  ALKPHOS 83  BILITOT 0.6  PROT 6.2*  ALBUMIN 3.1*   No results for input(s): "LIPASE", "AMYLASE" in the last 168 hours. No results for input(s): "AMMONIA" in the last 168 hours. Coagulation Profile: No results for input(s): "INR", "PROTIME" in the last 168 hours. Cardiac Enzymes: No results for input(s): "CKTOTAL", "CKMB", "CKMBINDEX", "TROPONINI" in the last 168 hours. BNP (last 3 results) No results for input(s): "PROBNP" in the last 8760 hours. HbA1C: No results for input(s): "HGBA1C" in the last 72 hours.  CBG: Recent Labs  Lab 02/17/24 0752 02/17/24 1123 02/17/24 1609 02/17/24 2056 02/18/24 1056  GLUCAP 207* 196* 205* 257* 280*   Lipid Profile: No results for input(s): "CHOL", "HDL", "LDLCALC", "TRIG", "CHOLHDL", "LDLDIRECT" in the last 72 hours. Thyroid Function Tests: No results for input(s): "TSH", "T4TOTAL", "FREET4", "T3FREE", "THYROIDAB" in the last 72 hours. Anemia Panel: No results for input(s): "VITAMINB12", "FOLATE", "FERRITIN", "TIBC", "IRON", "RETICCTPCT" in the last 72 hours. Sepsis Labs: No results for input(s): "PROCALCITON", "LATICACIDVEN" in the last 168 hours.  No results found for this or any previous visit (from the past 240 hours).    Radiology Studies: No results found.     Scheduled Meds:  amLODipine  5 mg Per Tube Daily   atorvastatin  40 mg Per Tube Daily   citalopram  10 mg Per Tube Daily   enoxaparin (LOVENOX) injection  40 mg Subcutaneous Q24H   famotidine  40 mg Per Tube BID   feeding supplement (PROSource TF20)  60 mL Per Tube Daily   ferrous sulfate  300 mg Per Tube Daily   fludrocortisone  0.1 mg Per Tube Daily   folic acid  1 mg Per Tube Daily   free water  200 mL Per Tube 6 X Daily   insulin aspart  0-9  Units Subcutaneous TID WC   levothyroxine  50 mcg Per Tube Q0600   predniSONE  5 mg Per Tube Daily   sucralfate  1 g Per Tube Q6H   Continuous Infusions:  feeding supplement (OSMOLITE 1.5 CAL)       LOS: 3 days   Time spent: 20 minutes   Noralee Stain, DO [facility] 02/18/2024, 12:42 PM   Available via Epic secure chat 7am-7pm After these hours, please refer to coverage provider listed on [email or web address]

## 2024-02-18 NOTE — Plan of Care (Signed)
   Problem: Coping: Goal: Ability to adjust to condition or change in health will improve Outcome: Progressing   Problem: Nutritional: Goal: Maintenance of adequate nutrition will improve Outcome: Progressing

## 2024-02-18 NOTE — Progress Notes (Signed)
 Palliative Medicine Progress Note   Patient Name: [name]       Date: 02/18/2024 DOB: Jun 10, 1946  Age: 78 y.o. MRN#: [id number] Attending Physician: Noralee Stain, DO Primary Care Physician: Lorrine Kin, MD Admit Date: 02/14/2024   HPI/Patient Profile: 78 y.o. 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 He presented to the ED on 02/14/2024 with right shoulder pain after mechanical fall.  He was found to have right humeral head fracture.  Orthopedic surgery consulted and recommended nonoperative management.   Palliative Medicine has been consulted for goals of care and medical decision making.  Subjective: Chart reviewed including vital signs, labs, and progress notes.  Assessed patient at bedside. He is current resting/sleeping and I did not attempt to wake him.   I spoke with son/[name] by phone.  Introduced myself and the role of Palliative Medicine.  Son reports patient has been followed by Palliative Medicine at [facility] for many months.  We reviewed patient's multiple comorbidities as well as current clinical condition.  Son reports that patient has recently been agreeable to hospice care at home.  Discussed code status. I encouraged consideration of DNR/DNI status and provided education on evidence-based poor outcomes in similar hospitalized patients, as the cause of cardiac arrest would likely be associated  with advanced illness rather than a reversible condition. Son agrees that DNR is appropriate and consistent with hospice philosophy.  Son agrees to wait and change code status tomorrow in case patient becomes agitated with placement of DNR bracelet.  Son states to please call him if there are any issues.    Objective:  Physical Exam Vitals reviewed.  Constitutional:      General: He is sleeping. He is not in acute distress.    Appearance: He is ill-appearing.  Pulmonary:     Effort: No respiratory distress.             Palliative Medicine Assessment & Plan   Assessment: Principal Problem:   Humerus fracture Active Problems:  Rheumatoid arthritis (HCC)   Non-insulin dependent type 2 diabetes mellitus (HCC)   PEG (percutaneous endoscopic gastrostomy) status (HCC)   Adrenal insufficiency (HCC)   Chronic anemia    Recommendations/Plan: Continue current scope of care Plan is for rehab and then home with hospice ([facility]) Son/[name] agreeable to DNR/DNI - I will place this order tomorrow when I can discuss with RN prior to placing bracelet on patient   Primary Decision Maker: 2 sons   Prognosis:  Unable to determine  Discharge Planning: SNF/rehab   Thank you for allowing the Palliative Medicine Team to assist in the care of this patient.   Time: 35 minutes   Merry Proud, NP   Please contact Palliative Medicine Team phone at [phone] for questions and concerns.  For individual providers, please see AMION.

## 2024-02-19 DIAGNOSIS — S42209A Unspecified fracture of upper end of unspecified humerus, initial encounter for closed fracture: Secondary | ICD-10-CM | POA: Diagnosis not present

## 2024-02-19 LAB — GLUCOSE, CAPILLARY
Glucose-Capillary: 205 mg/dL — ABNORMAL HIGH (ref 70–99)
Glucose-Capillary: 297 mg/dL — ABNORMAL HIGH (ref 70–99)
Glucose-Capillary: 409 mg/dL — ABNORMAL HIGH (ref 70–99)
Glucose-Capillary: 292 mg/dL — ABNORMAL HIGH (ref 70–99)

## 2024-02-19 MED ORDER — INSULIN GLARGINE-YFGN 100 UNIT/ML ~~LOC~~ SOLN
10.0000 [IU] | Freq: Every day | SUBCUTANEOUS | Status: DC
  Administered 2024-02-19: 10 [IU] via SUBCUTANEOUS
  Filled 2024-02-19 (×2): qty 0.1

## 2024-02-19 NOTE — Plan of Care (Signed)
  Problem: Education: Goal: Individualized Educational Video(s) Outcome: Progressing   Problem: Fluid Volume: Goal: Ability to maintain a balanced intake and output will improve Outcome: Progressing

## 2024-02-19 NOTE — Progress Notes (Signed)
 Patient's blood glucose was 409 at 1558. RN notified Dr. Alvino Chapel who advised that 11 units of insulin aspart should be given. RN administered 11 units at 1604. Patient remains asymptomatic.

## 2024-02-19 NOTE — Plan of Care (Signed)
   Problem: Education: Goal: Knowledge of General Education information will improve Description: Including pain rating scale, medication(s)/side effects and non-pharmacologic comfort measures Outcome: Progressing   Problem: Health Behavior/Discharge Planning: Goal: Ability to manage health-related needs will improve Outcome: Progressing   Problem: Clinical Measurements: Goal: Cardiovascular complication will be avoided Outcome: Progressing

## 2024-02-19 NOTE — Progress Notes (Signed)
 PROGRESS NOTE    [name]  WNU:[id number] DOB: 1946-12-21 DOA: 02/14/2024 PCP: Lorrine Kin, MD     Brief Narrative:  [name] is a 78 y.o. 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  He was found to have right humeral head fracture.  Orthopedic surgery consulted, recommended nonoperative management.  After admission, patient exhibited episodes of confusion and agitation.  New events last 24 hours / Subjective: No acute events overnight.  Resting comfortably.  Await SNF placement  Assessment & Plan:   Principal Problem:   Humerus fracture Active Problems:   Rheumatoid arthritis (HCC)   Non-insulin dependent type 2 diabetes mellitus (HCC)   PEG (percutaneous endoscopic gastrostomy) status (HCC)   Adrenal insufficiency (HCC)   Chronic anemia   Right humeral head fracture after mechanical fall at home -Orthopedic surgery consulted, plan for nonoperative management -Nonweightbearing to right upper extremity -PT OT recommending SNF  History of esophagitis and stenosis with spasms status post dilatation -PEG tube dependent. Continue clear liquid diet PO  -Tube feedings -Pepcid, Carafate  History of stroke -Not on antiplatelet agents due to history of GI bleed  Primary adrenal insufficiency -Florinef, prednisone  Non-insulin-dependent diabetes -Sliding scale insulin, semglee -dose increased due to  hyperglycemia  Hypertension -Norvasc  Hyperlipidemia -Lipitor  Hypothyroidism -Synthroid  Seropositive rheumatoid arthritis -Abatacept monthly, methotrexate   Dementia/cognitive impairment -Risk of sundowning -Delirium precautions   Goals of care -Per outpatient palliative care note 02/14/2024, patient has experienced decline in overall health and mental status over the past 6 to 12 months with increasing delusions and inability to remember recent events.  Family was concerned regarding patient's increasing need for medical care and support.  Reported frequent falls at that time.  During that visit, they recommended referral to local hospice agency -Palliative care consulted    DVT prophylaxis:  enoxaparin (LOVENOX) injection 40 mg Start: 02/17/24 1345 SCDs Start: 02/15/24 0543  Code Status: Full Family Communication: No family at bedside Disposition Plan: SNF Status is: Inpatient Remains inpatient appropriate because: SNF placement pending    Antimicrobials:  Anti-infectives (From admission, onward)    None        Objective: Vitals:   02/18/24 1952 02/19/24 0500 02/19/24 0541 02/19/24 0753  BP: 132/68  (!) 135/55 (!) 138/59  Pulse: 92  94 91  Resp:    17  Temp: 98.5 F (36.9 C)  98 F (36.7 C) 98.6 F (37 C)  TempSrc: Oral   Oral  SpO2: 97%   97%  Weight:  77.5 kg      Intake/Output Summary (Last 24 hours) at 02/19/2024 1313 Last data filed at 02/18/2024 1714 Gross per 24 hour  Intake --  Output 650 ml  Net -650 ml   Filed Weights   02/18/24 0318 02/19/24 0500  Weight: 77.5 kg 77.5 kg    Examination:  General exam: Appears calm and comfortable   Data Reviewed: I have personally reviewed following labs and imaging studies  CBC: Recent Labs  Lab 02/14/24 2016 02/16/24 0456  WBC 13.0* 10.5  NEUTROABS 11.6*  --  HGB 10.1* 9.7*  HCT 31.9* 30.3*  MCV 89.4 87.8  PLT 185 180   Basic Metabolic Panel: Recent Labs  Lab 02/14/24 2016  NA  140  K 4.7  CL 100  CO2 27  GLUCOSE 197*  BUN 29*  CREATININE 1.00  CALCIUM 9.0   GFR: CrCl cannot be calculated (Unknown ideal weight.). Liver Function Tests: Recent Labs  Lab 02/14/24 2016  AST 25  ALT 18  ALKPHOS 83  BILITOT 0.6  PROT 6.2*  ALBUMIN 3.1*   No results for input(s): "LIPASE", "AMYLASE" in the last 168 hours. No results for input(s): "AMMONIA" in the last 168 hours. Coagulation Profile: No results for input(s): "INR", "PROTIME" in the last 168 hours. Cardiac Enzymes: No results for input(s): "CKTOTAL", "CKMB", "CKMBINDEX", "TROPONINI" in the last 168 hours. BNP (last 3 results) No results for input(s): "PROBNP" in the last 8760 hours. HbA1C: No results for input(s): "HGBA1C" in the last 72 hours.  CBG: Recent Labs  Lab 02/18/24 1056 02/18/24 1655 02/18/24 1954 02/19/24 0628 02/19/24 1108  GLUCAP 280* 288* 303* 205* 297*   Lipid Profile: No results for input(s): "CHOL", "HDL", "LDLCALC", "TRIG", "CHOLHDL", "LDLDIRECT" in the last 72 hours. Thyroid Function Tests: No results for input(s): "TSH", "T4TOTAL", "FREET4", "T3FREE", "THYROIDAB" in the last 72 hours. Anemia Panel: No results for input(s): "VITAMINB12", "FOLATE", "FERRITIN", "TIBC", "IRON", "RETICCTPCT" in the last 72 hours. Sepsis Labs: No results for input(s): "PROCALCITON", "LATICACIDVEN" in the last 168 hours.  No results found for this or any previous visit (from the past 240 hours).    Radiology Studies: No results found.     Scheduled Meds:  amLODipine  5 mg Per Tube Daily   atorvastatin  40 mg Per Tube Daily   citalopram  10 mg Per Tube Daily   enoxaparin (LOVENOX) injection  40 mg Subcutaneous Q24H   famotidine  40 mg Per Tube BID   feeding supplement (PROSource TF20)  60 mL Per Tube Daily   ferrous sulfate  300 mg Per Tube Daily   fludrocortisone  0.1 mg Per Tube Daily   folic acid  1 mg Per Tube Daily   free water  200 mL Per Tube 6 X Daily   insulin aspart  0-9  Units Subcutaneous TID WC   insulin glargine-yfgn  10 Units Subcutaneous Q2200   levothyroxine  50 mcg Per Tube Q0600   predniSONE  5 mg Per Tube Daily   sucralfate  1 g Per Tube Q6H   Continuous Infusions:  feeding supplement (OSMOLITE 1.5 CAL)       LOS: 4 days   Time spent: 20 minutes   Noralee Stain, DO [facility] 02/19/2024, 1:13 PM   Available via Epic secure chat 7am-7pm After these hours, please refer to coverage provider listed on [email or web address]

## 2024-02-20 DIAGNOSIS — S42209A Unspecified fracture of upper end of unspecified humerus, initial encounter for closed fracture: Secondary | ICD-10-CM | POA: Diagnosis not present

## 2024-02-20 LAB — GLUCOSE, CAPILLARY
Glucose-Capillary: 232 mg/dL — ABNORMAL HIGH (ref 70–99)
Glucose-Capillary: 310 mg/dL — ABNORMAL HIGH (ref 70–99)

## 2024-02-20 LAB — CBC
HCT: 23.4 % — ABNORMAL LOW (ref 39.0–52.0)
Hemoglobin: 7.4 g/dL — ABNORMAL LOW (ref 13.0–17.0)
MCH: 27.8 pg (ref 26.0–34.0)
MCHC: 31.6 g/dL (ref 30.0–36.0)
MCV: 88 fL (ref 80.0–100.0)
Platelets: 184 10*3/uL (ref 150–400)
RBC: 2.66 MIL/uL — ABNORMAL LOW (ref 4.22–5.81)
RDW: 17.9 % — ABNORMAL HIGH (ref 11.5–15.5)
WBC: 12.9 10*3/uL — ABNORMAL HIGH (ref 4.0–10.5)
nRBC: 0 % (ref 0.0–0.2)

## 2024-02-20 LAB — BASIC METABOLIC PANEL
Anion gap: 7 (ref 5–15)
BUN: 38 mg/dL — ABNORMAL HIGH (ref 8–23)
CO2: 28 mmol/L (ref 22–32)
Calcium: 8.6 mg/dL — ABNORMAL LOW (ref 8.9–10.3)
Chloride: 100 mmol/L (ref 98–111)
Creatinine, Ser: 1.16 mg/dL (ref 0.61–1.24)
GFR, Estimated: >60 mL/min (ref >60)
Glucose, Bld: 304 mg/dL — ABNORMAL HIGH (ref 70–99)
Potassium: 3.8 mmol/L (ref 3.5–5.1)
Sodium: 135 mmol/L (ref 135–145)

## 2024-02-20 MED ORDER — INSULIN DETEMIR 100 UNIT/ML FLEXPEN
15.0000 [IU] | PEN_INJECTOR | Freq: Every day | SUBCUTANEOUS | Status: DC
Start: 2024-02-20 — End: 2024-03-20

## 2024-02-20 MED ORDER — INSULIN GLARGINE-YFGN 100 UNIT/ML ~~LOC~~ SOLN
15.0000 [IU] | Freq: Every day | SUBCUTANEOUS | Status: DC
  Filled 2024-02-20: qty 0.15

## 2024-02-20 MED ORDER — INSULIN ASPART 100 UNIT/ML IJ SOLN
0.0000 [IU] | INTRAMUSCULAR | Status: DC
  Administered 2024-02-20: 3 [IU] via SUBCUTANEOUS
  Administered 2024-02-20: 7 [IU] via SUBCUTANEOUS

## 2024-02-20 MED ORDER — OXYCODONE HCL 5 MG PO TABS: 5.0000 mg | ORAL_TABLET | Freq: Four times a day (QID) | ORAL | 0 refills | Status: DC | PRN

## 2024-02-20 MED ORDER — INSULIN ASPART 100 UNIT/ML FLEXPEN: 0.0000 [IU] | PEN_INJECTOR | Freq: Three times a day (TID) | SUBCUTANEOUS | Status: DC

## 2024-02-20 NOTE — Discharge Summary (Addendum)
 Physician Discharge Summary  [name] BJY:[id number] DOB: 1946/04/19 DOA: 02/14/2024  PCP: Lorrine Kin, MD  Admit date: 02/14/2024 Discharge date: 02/20/2024  Admitted From: Home Disposition:  SNF   Recommendations for Outpatient Follow-up:  Follow up with PCP Follow up with orthopedic surgery in 2 weeks Dr. Carola Frost   Discharge Condition: Stable CODE STATUS: DNR  Diet recommendation: Clear liquid diet, uses TF for main nutrition   Brief/Interim Summary: [name] is a 78 y.o. 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  He was found to have right humeral head fracture.  Orthopedic surgery consulted, recommended nonoperative management.  After admission, patient exhibited episodes of confusion and agitation, due to underlying dementia.   Discharge Diagnoses:   Principal Problem:   Humerus fracture Active Problems:   Rheumatoid arthritis (HCC)   Non-insulin dependent type 2 diabetes mellitus (HCC)   PEG (percutaneous endoscopic gastrostomy) status (HCC)   Adrenal insufficiency (HCC)   Chronic anemia   Right humeral head fracture after mechanical fall at home -Orthopedic surgery consulted, plan for nonoperative management -Nonweightbearing to right upper extremity -PT OT recommending SNF   History of esophagitis and stenosis with spasms status post dilatation -PEG tube dependent. Continue clear liquid diet PO  -Tube feedings -Pepcid, Carafate   History of  stroke -Not on antiplatelet agents due to history of GI bleed   Primary adrenal insufficiency -Florinef, prednisone   Non-insulin-dependent diabetes -Sliding scale insulin, semglee   Hypertension -Norvasc   Hyperlipidemia -Lipitor   Hypothyroidism -Synthroid   Seropositive rheumatoid arthritis -Abatacept monthly, methotrexate    Dementia/cognitive impairment -Risk of sundowning -Delirium precautions    Goals of care -Per outpatient palliative care note 02/14/2024, patient has experienced decline in overall health and mental status over the past 6 to 12 months with increasing delusions and inability to remember recent events.  Family was concerned regarding patient's increasing need for medical care and support.  Reported frequent falls at that time.  During that visit, they recommended referral to local hospice agency -Palliative care consulted   Discharge Instructions  Discharge Instructions     Increase activity slowly   Complete by: As directed       Allergies as of 02/20/2024   No Known Allergies      Medication List     TAKE these medications    abatacept 250 MG injection Commonly known as: ORENCIA Inject 250 mg into the vein every 30 (thirty) days. What changed: how much to take   amLODipine 5 MG tablet Commonly known as: NORVASC Take 1 tablet (5 mg total) by mouth daily. What changed: how to take this   atorvastatin 40 MG tablet Commonly known as: LIPITOR Place 1 tablet (40 mg total) into feeding tube daily.   citalopram 10 MG/5ML suspension Commonly known as: CELEXA Place 10 mg into feeding tube daily.   diclofenac Sodium 1 % Gel Commonly known as: VOLTAREN Apply 2 g topically as needed (Pain).   famotidine 40 MG/5ML suspension Commonly known as: PEPCID Place 40 mg into feeding tube every 12 (twelve) hours.  Feeding Supplies Kit 1 Box by Does not apply route daily. Patient will need pump and supplies for continuous tube feeds and 60mL  syringes for free water flushes   ferrous sulfate 300 (60 Fe) MG/5ML syrup Place 5 mLs (300 mg total) into feeding tube daily.   fludrocortisone 0.1 MG tablet Commonly known as: FLORINEF Place 1 tablet (0.1 mg total) into feeding tube daily.   fluticasone 50 MCG/ACT nasal spray Commonly known as: FLONASE Place 2 sprays into both nostrils daily as needed for allergies or rhinitis.   folic acid 1 MG tablet Commonly known as: FOLVITE Place 1 tablet (1 mg total) into feeding tube daily.   free water Soln Place 200 mLs into feeding tube 6 (six) times daily.   insulin aspart 100 UNIT/ML FlexPen Commonly known as: NOVOLOG Inject 0-9 Units into the skin 3 (three) times daily with meals. Blood Glucose 121 - 150: 1 units  BG 151 - 200: 2 units  BG 201 - 250: 3 units  BG 251 - 300: 5 units  BG 301 - 350: 7 units  BG 351 - 400: 9 units   insulin detemir 100 UNIT/ML FlexPen Commonly known as: LEVEMIR Inject 15 Units into the skin at bedtime.   levothyroxine 50 MCG tablet Commonly known as: SYNTHROID Place 1 tablet (50 mcg total) into feeding tube daily.   magnesium hydroxide 400 MG/5ML suspension Commonly known as: MILK OF MAGNESIA Take 5 mLs by mouth daily as needed for mild constipation or moderate constipation.   magnesium oxide 400 (240 Mg) MG tablet Commonly known as: MAG-OX Place 1 tablet (400 mg total) into feeding tube daily.   metFORMIN 500 MG tablet Commonly known as: GLUCOPHAGE Place 1 tablet (500 mg total) into feeding tube daily.   methotrexate 50 MG/2ML injection Inject 15 mg into the skin once a week. Every Monday.   naloxone 4 MG/0.1ML Liqd nasal spray kit Commonly known as: NARCAN Place 1 spray into the nose daily as needed (Respiratory distress from Overdose.).   oxyCODONE 5 MG immediate release tablet Commonly known as: Oxy IR/ROXICODONE Place 1 tablet (5 mg total) into feeding tube every 6 (six) hours as needed for severe pain (pain score 7-10) or  breakthrough pain. What changed: how to take this   predniSONE 5 MG tablet Commonly known as: DELTASONE Take 5 mg by mouth daily.   sucralfate 1 GM/10ML suspension Commonly known as: CARAFATE Place 10 mLs (1 g total) into feeding tube every 6 (six) hours. What changed: when to take this        Contact information for follow-up providers     Myrene Galas, MD. Schedule an appointment as soon as possible for a visit in 2 week(s).   Specialty: Orthopedic Surgery Contact information: [address] [place] [address] [phone]         Lorrine Kin, MD Follow up.   Specialty: Family Medicine Contact information: [address] [facility] [address] [place] [address] [phone]              Contact information for after-discharge care     Destination     [facility] Preferred SNF .   Service: Skilled Nursing Contact information: [address] [place] [address] [phone]                    No Known Allergies  Consultations: Orthopedic surgery    Procedures/Studies: CT Shoulder Right Wo Contrast Result Date: 02/15/2024 CLINICAL  DATA:  Right shoulder injury, fracture EXAM: CT OF THE UPPER RIGHT EXTREMITY WITHOUT CONTRAST TECHNIQUE: Multidetector CT imaging of the upper right extremity was performed according to the standard protocol. RADIATION DOSE REDUCTION: This exam was performed according to the departmental dose-optimization program which includes automated exposure control, adjustment of the mA and/or kV according to patient size and/or use of iterative reconstruction technique. COMPARISON:  None Available. FINDINGS: Bones/Joint/Cartilage There is a comminuted, impacted fracture of the right humeral head with an oblique fracture involving the surgical neck of the humerus with lateral angulation and impaction with 2.5 cm override of the fracture fragments. The articular surface of the humeral head  appears externally rotated but is still seated within the glenoid fossa. Greater and lesser tuberosities appear intact. Scapula and visualized clavicle are intact. Acromioclavicular joint space is preserved. Respiratory motion artifact slightly limits evaluation of the thoracic cage,, the visualized osseous structures appear intact. Ligaments Suboptimally assessed by CT. Muscles and Tendons There is heterogeneous attenuation and focal thinning of the supraspinatus tendon best seen on coronal image # 45/6 and disruption of this tendon is not excluded though not optimally visualized on this examination. Infraspinatus and subscapularis tendons appear intact. There is marked thickening and hyperdensity the deltoid and to a lesser extent the visualized triceps and peripheral left latissimus dorsi musculature in keeping with intramuscular hemorrhage. Soft tissues Right shoulder effusion is present. Advanced vascular calcifications are seen within the central right upper extremity arterial vasculature. Extensive subcutaneous edema within the visualized right upper extremity. IMPRESSION: 1. Comminuted, impacted fracture of the right humeral head with an oblique fracture involving the surgical neck of the humerus with lateral angulation and impaction with 2.5 cm override of the fracture fragments. The articular surface of the humeral head appears externally rotated but is still seated within the glenoid fossa. 2. Heterogeneous attenuation and focal thinning of the supraspinatus tendon. Disruption of this tendon is not excluded though not optimally visualized on this examination. This could be better assessed with MRI examination. 3. Marked thickening and hyperdensity the deltoid and to a lesser extent the visualized triceps and peripheral left latissimus dorsi musculature in keeping with intramuscular hemorrhage. 4. Right shoulder effusion. 5. Extensive subcutaneous edema within the visualized right upper extremity. 6.  Advanced vascular calcifications within the central right upper extremity arterial vasculature. Electronically Signed   By: Helyn Numbers M.D.   On: 02/15/2024 00:18   CT Head Wo Contrast Result Date: 02/14/2024 CLINICAL DATA:  Head trauma, minor (Age >= 65y); Neck trauma (Age >= 65y) EXAM: CT HEAD WITHOUT CONTRAST CT CERVICAL SPINE WITHOUT CONTRAST TECHNIQUE: Multidetector CT imaging of the head and cervical spine was performed following the standard protocol without intravenous contrast. Multiplanar CT image reconstructions of the cervical spine were also generated. RADIATION DOSE REDUCTION: This exam was performed according to the departmental dose-optimization program which includes automated exposure control, adjustment of the mA and/or kV according to patient size and/or use of iterative reconstruction technique. COMPARISON:  None Available. FINDINGS: CT HEAD FINDINGS Brain: Cerebral ventricle sizes are concordant with the degree of cerebral volume loss. Patchy and confluent areas of decreased attenuation are noted throughout the deep and periventricular white matter of the cerebral hemispheres bilaterally, compatible with chronic microvascular ischemic disease. No evidence of large-territorial acute infarction. No parenchymal hemorrhage. No mass lesion. No extra-axial collection. No mass effect or midline shift. No hydrocephalus. Basilar cisterns are patent. Vascular: No hyperdense vessel. Atherosclerotic calcifications are present within the cavernous internal carotid and vertebral arteries.  Skull: No acute fracture or focal lesion. Sinuses/Orbits: Paranasal sinuses and mastoid air cells are clear. The orbits are unremarkable. Other: None. CT CERVICAL SPINE FINDINGS Alignment: Grade 1 anterolisthesis of C4 on C5. Skull base and vertebrae: Multilevel moderate severe degenerative changes of the spine. Associated severe osseous neural foraminal stenosis at the right C3-C4, C5-C6 levels. No severe osseous  neural central canal stenosis. No acute fracture. No aggressive appearing focal osseous lesion or focal pathologic process. Soft tissues and spinal canal: No prevertebral fluid or swelling. No visible canal hematoma. Upper chest: Biapical pleural/pulmonary scarring. Emphysematous changes. Other: Atherosclerotic plaque of the carotid arteries within the neck. IMPRESSION: 1. No acute intracranial abnormality. 2. No acute displaced fracture or traumatic listhesis of the cervical spine. 3. Multilevel moderate severe degenerative changes of the spine. Associated severe osseous neural foraminal stenosis at the right C3-C4, C5-C6 levels. Electronically Signed   By: Tish Frederickson M.D.   On: 02/14/2024 21:42   CT Cervical Spine Wo Contrast Result Date: 02/14/2024 CLINICAL DATA:  Head trauma, minor (Age >= 65y); Neck trauma (Age >= 65y) EXAM: CT HEAD WITHOUT CONTRAST CT CERVICAL SPINE WITHOUT CONTRAST TECHNIQUE: Multidetector CT imaging of the head and cervical spine was performed following the standard protocol without intravenous contrast. Multiplanar CT image reconstructions of the cervical spine were also generated. RADIATION DOSE REDUCTION: This exam was performed according to the departmental dose-optimization program which includes automated exposure control, adjustment of the mA and/or kV according to patient size and/or use of iterative reconstruction technique. COMPARISON:  None Available. FINDINGS: CT HEAD FINDINGS Brain: Cerebral ventricle sizes are concordant with the degree of cerebral volume loss. Patchy and confluent areas of decreased attenuation are noted throughout the deep and periventricular white matter of the cerebral hemispheres bilaterally, compatible with chronic microvascular ischemic disease. No evidence of large-territorial acute infarction. No parenchymal hemorrhage. No mass lesion. No extra-axial collection. No mass effect or midline shift. No hydrocephalus. Basilar cisterns are patent.  Vascular: No hyperdense vessel. Atherosclerotic calcifications are present within the cavernous internal carotid and vertebral arteries. Skull: No acute fracture or focal lesion. Sinuses/Orbits: Paranasal sinuses and mastoid air cells are clear. The orbits are unremarkable. Other: None. CT CERVICAL SPINE FINDINGS Alignment: Grade 1 anterolisthesis of C4 on C5. Skull base and vertebrae: Multilevel moderate severe degenerative changes of the spine. Associated severe osseous neural foraminal stenosis at the right C3-C4, C5-C6 levels. No severe osseous neural central canal stenosis. No acute fracture. No aggressive appearing focal osseous lesion or focal pathologic process. Soft tissues and spinal canal: No prevertebral fluid or swelling. No visible canal hematoma. Upper chest: Biapical pleural/pulmonary scarring. Emphysematous changes. Other: Atherosclerotic plaque of the carotid arteries within the neck. IMPRESSION: 1. No acute intracranial abnormality. 2. No acute displaced fracture or traumatic listhesis of the cervical spine. 3. Multilevel moderate severe degenerative changes of the spine. Associated severe osseous neural foraminal stenosis at the right C3-C4, C5-C6 levels. Electronically Signed   By: Tish Frederickson M.D.   On: 02/14/2024 21:42   DG Chest Port 1 View Result Date: 02/14/2024 CLINICAL DATA:  Fall. EXAM: PORTABLE CHEST 1 VIEW COMPARISON:  05/16/2023 FINDINGS: Stable heart size and mediastinal contours. Mild chronic elevation of right hemidiaphragm. Bibasilar atelectasis without confluent airspace disease. No pleural fluid or pneumothorax. Mild chronic rib deformity at the left lung apex. IMPRESSION: Bibasilar atelectasis. Electronically Signed   By: Narda Rutherford M.D.   On: 02/14/2024 20:49   DG Shoulder Right Result Date: 02/14/2024 CLINICAL DATA:  Pain after  fall. EXAM: RIGHT SHOULDER - 2+ VIEW COMPARISON:  None Available. FINDINGS: Comminuted and displaced proximal humerus fracture. The  dominant fracture plane is through the anatomic neck. There is mild apex medial angulation. Glenohumeral dislocation. Acromioclavicular alignment is normal. Generalized soft tissue edema. IMPRESSION: Comminuted displaced proximal humerus fracture. Electronically Signed   By: Narda Rutherford M.D.   On: 02/14/2024 20:46   CT ABDOMEN PELVIS W CONTRAST Result Date: 02/10/2024 CLINICAL DATA:  right lower quadrant abdominal pain. Type 2 diabetes. EXAM: CT ABDOMEN AND PELVIS WITH CONTRAST TECHNIQUE: Multidetector CT imaging of the abdomen and pelvis was performed using the standard protocol following bolus administration of intravenous contrast. RADIATION DOSE REDUCTION: This exam was performed according to the departmental dose-optimization program which includes automated exposure control, adjustment of the mA and/or kV according to patient size and/or use of iterative reconstruction technique. CONTRAST:  75mL OMNIPAQUE IOHEXOL 300 MG/ML  SOLN COMPARISON:  08/18/2022 FINDINGS: Lower chest: Emphysema. Architectural distortion in both lung bases with mild traction bronchiectasis and bronchiolectasis. A subpleural right lower lobe opacity including on 7/4 is relatively similar, favoring scar or atelectasis. Normal heart size. Multivessel coronary artery atherosclerosis. Trace bilateral pleural fluid. Small hiatal hernia with suggestion of soft tissue fullness at the gastroesophageal junction on 17/2, possibly present on the prior. Distal esophageal wall thickening is chronic. Hepatobiliary: Normal liver. Cholecystectomy, without biliary ductal dilatation. Pancreas: Normal, without mass or ductal dilatation. Spleen: Normal in size, without focal abnormality. Adrenals/Urinary Tract: Normal adrenal glands. At least 1 left renal collecting system calculus of 6 mm is unchanged. Concurrent left renal vascular calcifications. Dominant interpolar right renal cyst of 4.0 cm. Other right renal lesions are too small to  characterize but most likely cysts . In the absence of clinically indicated signs/symptoms require(s) no independent follow-up. No hydronephrosis. Degraded evaluation of the pelvis, secondary to beam hardening artifact from left proximal femur fixation. Grossly normal urinary bladder. Stomach/Bowel: A gastrojejunostomy tube is appropriately positioned. Colonic stool burden suggests constipation. Normal appendix on 58/2. Otherwise normal small bowel. Vascular/Lymphatic: Aortic atherosclerosis. Infrarenal abdominal aortic stent graft repair with stable native sac size on the order of 3.3 x 3.2 cm. Chronic aneurysmal dilatation of the left common iliac portion. No abdominopelvic adenopathy. Reproductive: Mild prostatomegaly. Other: No significant free fluid.  No free intraperitoneal air. Musculoskeletal: Mild bilateral gynecomastia. Left proximal femur fixation. Remote left inferior pubic ramus fracture. Osteopenia. Remote left iliac wing fracture. Lumbar spondylosis. IMPRESSION: 1. Possible constipation. No other explanation for right lower quadrant pain. 2. Chronic distal esophageal wall thickening and small hiatal hernia. Possible soft tissue fullness at the gastroesophageal junction. Correlate with history of fundoplication, which could have this appearance. If no such history, consider further evaluation with endoscopy. 3. Emphysema (ICD10-J43.9). Interstitial lung disease with probable atelectasis at the right lung base, similar. Similar trace bilateral pleural effusions. 4. Incidental findings, including: Coronary artery atherosclerosis. Aortic Atherosclerosis (ICD10-I70.0). Left nephrolithiasis. Electronically Signed   By: Jeronimo Greaves M.D.   On: 02/10/2024 11:05      Discharge Exam: Vitals:   02/20/24 0401 02/20/24 0806  BP: (!) 128/53 (!) 111/55  Pulse: 89 87  Resp: 16 17  Temp: 98.4 F (36.9 C) 99.8 F (37.7 C)  SpO2: 97% 92%    General: Pt is alert, awake, not in acute  distress Cardiovascular: RRR, S1/S2 +, no edema Respiratory: No respiratory distress, no conversational dyspnea  Abdominal: ND Extremities: no edema, no cyanosis, RUE in sling with bruising     The results of  significant diagnostics from this hospitalization (including imaging, microbiology, ancillary and laboratory) are listed below for reference.     Microbiology: No results found for this or any previous visit (from the past 240 hours).   Labs: BNP (last 3 results) No results for input(s): "BNP" in the last 8760 hours. Basic Metabolic Panel: Recent Labs  Lab 02/14/24 2016 02/20/24 1037  NA 140 135  K 4.7 3.8  CL 100 100  CO2 27 28  GLUCOSE 197* 304*  BUN 29* 38*  CREATININE 1.00 1.16  CALCIUM 9.0 8.6*   Liver Function Tests: Recent Labs  Lab 02/14/24 2016  AST 25  ALT 18  ALKPHOS 83  BILITOT 0.6  PROT 6.2*  ALBUMIN 3.1*   No results for input(s): "LIPASE", "AMYLASE" in the last 168 hours. No results for input(s): "AMMONIA" in the last 168 hours. CBC: Recent Labs  Lab 02/14/24 2016 02/16/24 0456 02/20/24 1037  WBC 13.0* 10.5 12.9*  NEUTROABS 11.6*  --   --   HGB 10.1* 9.7* 7.4*  HCT 31.9* 30.3* 23.4*  MCV 89.4 87.8 88.0  PLT 185 180 184   Cardiac Enzymes: No results for input(s): "CKTOTAL", "CKMB", "CKMBINDEX", "TROPONINI" in the last 168 hours. BNP: Invalid input(s): "POCBNP" CBG: Recent Labs  Lab 02/19/24 1108 02/19/24 1558 02/19/24 2026 02/20/24 0552 02/20/24 1125  GLUCAP 297* 409* 292* 232* 310*   D-Dimer No results for input(s): "DDIMER" in the last 72 hours. Hgb A1c No results for input(s): "HGBA1C" in the last 72 hours. Lipid Profile No results for input(s): "CHOL", "HDL", "LDLCALC", "TRIG", "CHOLHDL", "LDLDIRECT" in the last 72 hours. Thyroid function studies No results for input(s): "TSH", "T4TOTAL", "T3FREE", "THYROIDAB" in the last 72 hours.  Invalid input(s): "FREET3" Anemia work up No results for input(s): "VITAMINB12",  "FOLATE", "FERRITIN", "TIBC", "IRON", "RETICCTPCT" in the last 72 hours. Urinalysis    Component Value Date/Time   COLORURINE YELLOW 05/16/2023 1755   APPEARANCEUR CLEAR 05/16/2023 1755   LABSPEC 1.017 05/16/2023 1755   PHURINE 7.0 05/16/2023 1755   GLUCOSEU NEGATIVE 05/16/2023 1755   HGBUR NEGATIVE 05/16/2023 1755   BILIRUBINUR NEGATIVE 05/16/2023 1755   KETONESUR NEGATIVE 05/16/2023 1755   PROTEINUR NEGATIVE 05/16/2023 1755   UROBILINOGEN 0.2 05/28/2015 1540   NITRITE NEGATIVE 05/16/2023 1755   LEUKOCYTESUR NEGATIVE 05/16/2023 1755   Sepsis Labs Recent Labs  Lab 02/14/24 2016 02/16/24 0456 02/20/24 1037  WBC 13.0* 10.5 12.9*   Microbiology No results found for this or any previous visit (from the past 240 hours).   Patient was seen and examined on the day of discharge and was found to be in stable condition. Time coordinating discharge: 35 minutes including assessment and coordination of care, as well as examination of the patient.   SIGNED:  Noralee Stain, DO [facility] 02/20/2024, 2:17 PM

## 2024-02-20 NOTE — Progress Notes (Signed)
 Occupational Therapy Treatment Patient Details Name: [name] MRN: [id number] DOB: 03-30-1946 Today's Date: 02/20/2024   History of present illness Pt is a 78 y.o. M who presents 02/14/2024 with right humeral head fx after a fall; recommendation of nonoperative management. 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   OT comments  Pt seen with PT to optimize participation and OOB safety.  Pt requires up to mod assist +2 transfers, pt requires redirection and cueing for safety throughout session.  Total assist +2 for toileting hygiene (incontinent of bowel), total assist +2 for LB ADLs and total assist for repositioning of sling.  He tends to lean forward on RUE when EOB or on BSC and requires cueing to reposition to avoid WB on RUE.  Will follow acutely, continue to recommend <3hrs/day inpatient setting.       If plan is discharge home, recommend the following:  Two people to help with walking and/or transfers;Two people to help with bathing/dressing/bathroom;Supervision due to cognitive status;Direct supervision/assist for financial management;Direct supervision/assist for medications management;Assistance with cooking/housework;Assist for transportation   Equipment Recommendations  Other (comment) (defer)    Recommendations for Other Services      Precautions / Restrictions Precautions Precautions: Fall Recall of Precautions/Restrictions: Impaired Precaution/Restrictions Comments: Posey and mitts, PEG (chronic); no shoulder ROM, okay for elbow,wrist and hand (per Charma Igo, PA-C 2/21) Required Braces or Orthoses: Sling Restrictions Weight Bearing Restrictions Per Provider Order: Yes RUE Weight Bearing Per Provider Order: Non weight bearing Other Position/Activity Restrictions: sling at all times       Mobility Bed Mobility Overal bed mobility: Needs Assistance Bed Mobility: Supine to Sit, Sit to Supine     Supine to sit: Mod assist, +2 for physical assistance Sit to supine: Min assist   General bed mobility comments: up to mod assist +2 to sit EOB    Transfers Overall transfer level: Needs assistance Equipment used: None Transfers: Sit to/from Stand, Bed to chair/wheelchair/BSC Sit to Stand: Mod assist, +2 physical assistance Stand pivot transfers: Mod assist, +2 physical assistance         General transfer comment: patient requires up to light mod assist +2 for safety, cueing and pivoting to recliner/BSC     Balance Overall balance assessment: Needs assistance Sitting-balance support: Feet supported Sitting balance-Leahy Scale: Fair Sitting balance - Comments: min guard, cueing for upright posture to avoid WB through R UE.  limited dynamically, tends to lean posteirorly in hopes to get back to bed   Standing balance support: Single extremity supported, During functional activity Standing balance-Leahy Scale: Poor Standing balance comment: requires external support from therapist                           ADL either performed or assessed with clinical judgement   ADL Overall ADL's : Needs assistance/impaired                     Lower Body Dressing: Total assistance;+2 for physical assistance;Sit to/from stand   Toilet Transfer: Moderate assistance;+2 for physical assistance;Stand-pivot   Toileting- Clothing Manipulation and Hygiene: Total assistance;+2 for physical assistance;Sit to/from  stand       Functional mobility during ADLs: Maximal assistance;Moderate assistance;+2 for physical assistance       Extremity/Trunk Assessment              Vision       Perception     Praxis     Communication Communication Communication: Impaired Factors Affecting Communication: Reduced clarity of speech   Cognition Arousal: Lethargic Behavior During Therapy: Flat affect Cognition: History of cognitive impairments, Cognition impaired             OT - Cognition Comments: pt with no recall of precautions, focused attention and requires frequent repetition.  Cueing for safety.                 Following commands: Impaired Following commands impaired: Follows one step commands inconsistently, Follows one step commands with increased time      Cueing   Cueing Techniques: Verbal cues, Tactile cues, Visual cues, Gestural cues  Exercises      Shoulder Instructions       General Comments corrected position of sling, placed on blankets for increased support    Pertinent Vitals/ Pain       Pain Assessment Pain Assessment: Faces Faces Pain Scale: Hurts even more Pain Location: RUE, generalized Pain Descriptors / Indicators: Grimacing, Guarding Pain Intervention(s): Limited activity within patient's tolerance, Monitored during session, Repositioned  Home Living                                          Prior Functioning/Environment              Frequency  Min 1X/week        Progress Toward Goals  OT Goals(current goals can now be found in the care plan section)  Progress towards OT goals: Progressing toward goals (slowly)  Acute Rehab OT Goals Patient Stated Goal: none stated OT Goal Formulation: Patient unable to participate in goal setting Time For Goal Achievement: 03/02/24 Potential to Achieve Goals: Fair  Plan      Co-evaluation    PT/OT/SLP Co-Evaluation/Treatment: Yes Reason for Co-Treatment: For patient/therapist safety;To address functional/ADL transfers;Necessary to address cognition/behavior during functional activity PT  goals addressed during session: Mobility/safety with mobility OT goals addressed during session: ADL's and self-care      AM-PAC OT "6 Clicks" Daily Activity     Outcome Measure   Help from another person eating meals?: A Lot Help from another person taking care of personal grooming?: A Lot Help from another person toileting, which includes using toliet, bedpan, or urinal?: Total Help from another person bathing (including washing, rinsing, drying)?: A Lot Help from another person to put on and taking off regular upper body clothing?: A Lot Help from another person to put on and taking off regular lower body clothing?: Total 6 Click Score: 10    End of Session Equipment Utilized During Treatment: Other (comment) (sling)  OT Visit Diagnosis: Other abnormalities of gait and mobility (R26.89);Muscle weakness (generalized) (M62.81);History of falling (Z91.81);Pain;Other symptoms and signs involving cognitive function Pain - Right/Left: Right Pain - part of body: Shoulder;Arm   Activity Tolerance Patient limited by pain   Patient Left with call bell/phone within reach;with restraints reapplied;in chair;with chair alarm set   Nurse Communication Precautions;Mobility status        Time: 1610-9604 OT Time Calculation (min): 32 min  Charges: OT General  Charges $OT Visit: 1 Visit OT Treatments $Self Care/Home Management : 8-22 mins  Barry Brunner, OT Acute Rehabilitation Services Office [phone] Secure Chat Preferred    Chancy Milroy 02/20/2024, 1:37 PM

## 2024-02-20 NOTE — Progress Notes (Signed)
 PROGRESS NOTE    [name]  WUJ:[id number] DOB: [birth year] DOA: 02/14/2024 PCP: Lorrine Kin, MD     Brief Narrative:  [name] is a 78 y.o. 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  He was found to have right humeral head fracture.  Orthopedic surgery consulted, recommended nonoperative management.  After admission, patient exhibited episodes of confusion and agitation.  New events last 24 hours / Subjective: Resting comfortably, denies significant pain.  With his history of dementia, does appear to be a little bit more confused today.  Blood sugars have been elevated  Assessment & Plan:   Principal Problem:   Humerus fracture Active Problems:   Rheumatoid arthritis (HCC)   Non-insulin dependent type 2 diabetes mellitus (HCC)   PEG (percutaneous endoscopic gastrostomy) status (HCC)   Adrenal insufficiency (HCC)   Chronic anemia   Right humeral head fracture after mechanical fall at home -Orthopedic surgery consulted, plan for nonoperative management -Nonweightbearing to right upper extremity -PT OT recommending SNF  History of esophagitis and stenosis with spasms status post dilatation -PEG tube dependent. Continue clear liquid diet PO  -Tube feedings -Pepcid, Carafate  History of stroke -Not on antiplatelet agents due to history of GI bleed  Primary adrenal insufficiency -Florinef, prednisone  Non-insulin-dependent diabetes -Sliding scale insulin,  semglee -Semglee increased, also increase sliding scale insulin to every 4 hours  Hypertension -Norvasc  Hyperlipidemia -Lipitor  Hypothyroidism -Synthroid  Seropositive rheumatoid arthritis -Abatacept monthly, methotrexate   Dementia/cognitive impairment -Risk of sundowning -Delirium precautions   Goals of care -Per outpatient palliative care note 02/14/2024, patient has experienced decline in overall health and mental status over the past 6 to 12 months with increasing delusions and inability to remember recent events.  Family was concerned regarding patient's increasing need for medical care and support.  Reported frequent falls at that time.  During that visit, they recommended referral to local hospice agency -Palliative care consulted    DVT prophylaxis:  enoxaparin (LOVENOX) injection 40 mg Start: 02/17/24 1345 SCDs Start: 02/15/24 0543  Code Status: Full Family Communication: No family at bedside Disposition Plan: SNF Status is: Inpatient Remains inpatient appropriate because: SNF placement pending, can take tomorrow 2/25    Antimicrobials:  Anti-infectives (From admission, onward)    None        Objective: Vitals:   02/19/24 1409 02/19/24 2000 02/20/24 0401 02/20/24 0806  BP: (!) 120/57 115/73 (!) 128/53 (!) 111/55  Pulse: 83 74 89 87  Resp: 17 18 16 17   Temp: (!) 97.3 F (36.3 C) 98.4 F (36.9 C) 98.4 F (36.9 C) 99.8 F (37.7 C)  TempSrc: Oral Oral Oral Oral  SpO2: 97% 98% 97% 92%  Weight:        Intake/Output Summary (Last 24 hours) at 02/20/2024 1258 Last data filed at 02/19/2024 1834 Gross per 24 hour  Intake --  Output 750 ml  Net -750 ml   Filed Weights   02/18/24 0318 02/19/24 0500  Weight: 77.5 kg 77.5 kg    Examination:  General exam: Appears calm and comfortable  Data Reviewed: I have personally reviewed following labs and imaging studies  CBC: Recent Labs  Lab 02/14/24 2016 02/16/24 0456 02/20/24 1037  WBC 13.0*  10.5 12.9*  NEUTROABS 11.6*  --   --   HGB 10.1* 9.7* 7.4*  HCT 31.9* 30.3* 23.4*  MCV 89.4 87.8 88.0  PLT 185 180 184   Basic Metabolic Panel: Recent Labs  Lab 02/14/24 2016 02/20/24 1037  NA 140 135  K 4.7 3.8  CL 100 100  CO2 27 28  GLUCOSE 197* 304*  BUN 29* 38*  CREATININE 1.00 1.16  CALCIUM 9.0 8.6*   GFR: CrCl cannot be calculated (Unknown ideal weight.). Liver Function Tests: Recent Labs  Lab 02/14/24 2016  AST 25  ALT 18  ALKPHOS 83  BILITOT 0.6  PROT 6.2*  ALBUMIN 3.1*   No results for input(s): "LIPASE", "AMYLASE" in the last 168 hours. No results for input(s): "AMMONIA" in the last 168 hours. Coagulation Profile: No results for input(s): "INR", "PROTIME" in the last 168 hours. Cardiac Enzymes: No results for input(s): "CKTOTAL", "CKMB", "CKMBINDEX", "TROPONINI" in the last 168 hours. BNP (last 3 results) No results for input(s): "PROBNP" in the last 8760 hours. HbA1C: No results for input(s): "HGBA1C" in the last 72 hours.  CBG: Recent Labs  Lab 02/19/24 1108 02/19/24 1558 02/19/24 2026 02/20/24 0552 02/20/24 1125  GLUCAP 297* 409* 292* 232* 310*   Lipid Profile: No results for input(s): "CHOL", "HDL", "LDLCALC", "TRIG", "CHOLHDL", "LDLDIRECT" in the last 72 hours. Thyroid Function Tests: No results for input(s): "TSH", "T4TOTAL", "FREET4", "T3FREE", "THYROIDAB" in the last 72 hours. Anemia Panel: No results for input(s): "VITAMINB12", "FOLATE", "FERRITIN", "TIBC", "IRON", "RETICCTPCT" in the last 72 hours. Sepsis Labs: No results for input(s): "PROCALCITON", "LATICACIDVEN" in the last 168 hours.  No results found for this or any previous visit (from the past 240 hours).    Radiology Studies: No results found.     Scheduled Meds:  amLODipine  5 mg Per Tube Daily   atorvastatin  40 mg Per Tube Daily   citalopram  10 mg Per Tube Daily   enoxaparin (LOVENOX) injection  40 mg Subcutaneous Q24H   famotidine  40 mg Per Tube BID    feeding supplement (PROSource TF20)  60 mL Per Tube Daily   ferrous sulfate  300 mg Per Tube Daily   fludrocortisone  0.1 mg Per Tube Daily   folic acid  1 mg Per Tube Daily   free water  200 mL Per Tube 6 X Daily   insulin aspart  0-9 Units Subcutaneous Q4H   insulin glargine-yfgn  15 Units Subcutaneous Q2200   levothyroxine  50 mcg Per Tube Q0600   predniSONE  5 mg Per Tube Daily   sucralfate  1 g Per Tube Q6H   Continuous Infusions:  feeding supplement (OSMOLITE 1.5 CAL)       LOS: 5 days   Time spent: 20 minutes   Noralee Stain, DO [facility] 02/20/2024, 12:58 PM   Available via Epic secure chat 7am-7pm After these hours, please refer to coverage provider listed on [email or web address]

## 2024-02-20 NOTE — Discharge Planning (Signed)
 Patient alert. IV access removed. Discharge teaching given to Olu RN at [facility] . Discharge summary placed in discharge packet. Patient will be transported to the facility via [facility]

## 2024-02-20 NOTE — TOC Progression Note (Addendum)
 Transition of Care [facility]) - Progression Note    Patient Details  Name: [name] MRN: [id number] Date of Birth: April 15, 1946  Transition of Care [facility]) CM/SW Contact  Lorri Frederick, LCSW Phone Number: 02/20/2024, 12:32 PM  Clinical Narrative:   CSW spoke with pt [name] regarding bed offers, discussed [facility] full.  He asked CSW to recheck with [facility] as pt had been there several times before, unsure why they would decline him now.  Also would be interested in [facility] or [facility].    [facility] Full.  [facility] does offer.  CSW spoke with [facility], who does now offer bed for tomorrow--will need tube feed pump.  CSW spoke with [name], he does want to accept offer at [facility].   Medicare payer with inpt order 2/19.  1300: TC [facility]: they can now accept pt today.  MD notified. [name] notified.   Expected Discharge Plan: Skilled Nursing Facility Barriers to Discharge: Continued Medical Work up  Expected Discharge Plan and Services   Discharge Planning Services: CM Consult                                           Social Determinants of Health (SDOH) Interventions SDOH Screenings   Food Insecurity: No Food Insecurity (02/17/2024)  Housing: Unknown (02/17/2024)  Transportation Needs: No Transportation Needs (02/17/2024)  Utilities: Not At Risk (02/17/2024)  Financial Resource Strain: Low Risk  (02/02/2022)   Received from [facility], [facility]  Physical Activity: Inactive (02/02/2022)   Received from [facility], [facility]  Social Connections: Socially Isolated (02/02/2022)   Received from [facility], [facility]  Stress: No Stress Concern Present (02/02/2022)   Received from [facility], [facility]  Tobacco Use: Medium Risk (02/14/2024)    Readmission Risk Interventions    01/21/2023     9:13 AM  Readmission Risk Prevention Plan  Transportation Screening Complete  Medication Review (RN Care Manager) Complete  PCP or Specialist appointment within 3-5 days of discharge Complete  HRI or Home Care Consult Complete  SW Recovery Care/Counseling Consult Complete  Palliative Care Screening Not Applicable  Skilled Nursing Facility Complete

## 2024-02-20 NOTE — Plan of Care (Signed)
  Problem: Education: Goal: Individualized Educational Video(s) Outcome: Progressing   Problem: Fluid Volume: Goal: Ability to maintain a balanced intake and output will improve Outcome: Progressing   Problem: Elimination: Goal: Will not experience complications related to bowel motility Outcome: Progressing   Problem: Skin Integrity: Goal: Risk for impaired skin integrity will decrease Outcome: Progressing

## 2024-02-20 NOTE — Progress Notes (Signed)
 Physical Therapy Treatment Patient Details Name: [name] MRN: [id number] DOB: Oct 08, 1946 Today's Date: 02/20/2024   History of Present Illness Pt is a 78 y.o. M who presents 02/14/2024 with right humeral head fx after a fall; recommendation of nonoperative management. 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    PT Comments  Pt was able to get up OOB and stand multiple times for pericare, transfer to [facility] and then transfer to recliner chair.  He needs some encouragement to participate due to pain in R UE with mobility, however, he looked much more comfortable seated reclined in the recliner chair with R UE sling adjusted to support his arm and pillows for positioning and comfort than he did in the bed.  PT will continue to follow acutely for safe mobility progression.    If plan is discharge home, recommend the following: A lot of help with walking and/or transfers;A lot of help with bathing/dressing/bathroom   Can travel by private vehicle     No  Equipment Recommendations  BSC/3in1;Wheelchair (measurements PT);Wheelchair cushion (measurements PT)    Recommendations for Other Services       Precautions / Restrictions Precautions Precautions: Fall Recall of Precautions/Restrictions: Impaired Precaution/Restrictions Comments: Posey and mitts, PEG (chronic); no shoulder ROM, okay for elbow,wrist and hand (per Charma Igo, PA-C 2/21) Required Braces or Orthoses: Sling Restrictions Weight Bearing Restrictions Per Provider Order: Yes RUE Weight Bearing Per Provider Order:  Non weight bearing Other Position/Activity Restrictions: sling at all times     Mobility  Bed Mobility Overal bed mobility: Needs Assistance Bed Mobility: Supine to Sit, Sit to Supine     Supine to sit: Mod assist, +2 for physical assistance Sit to supine: Min assist (mostly because he wanted to get back into bed several times, needed convincing to get up.)   General bed mobility comments: up to mod assist +2 to sit EOB supporting trunk and initiating movement of legs over the side of the bed.    Transfers Overall transfer level: Needs assistance Equipment used: None Transfers: Sit to/from Stand, Bed to chair/wheelchair/BSC Sit to Stand: Mod assist, +2 physical assistance Stand pivot transfers: Mod assist, +2 physical assistance         General transfer comment: patient requires up to light mod assist +2 for safety, cueing and pivoting to recliner/BSC.  He was able to take pivotal steps, posterior LOB tendency.    Ambulation/Gait                   Stairs             Wheelchair Mobility     Tilt Bed    Modified Rankin (Stroke Patients Only)       Balance Overall balance assessment: Needs assistance Sitting-balance support: Feet supported Sitting balance-Leahy Scale: Fair Sitting balance - Comments: min guard, cueing for upright posture to avoid WB through R UE.  limited dynamically, tends to lean posteirorly in hopes to get back to bed   Standing balance support: Single extremity supported, During functional activity Standing balance-Leahy Scale: Poor Standing balance comment: requires external support from therapist  Communication Communication Communication: Impaired Factors Affecting Communication: Reduced clarity of speech  Cognition Arousal: Lethargic Behavior During Therapy: Flat affect   PT - Cognitive impairments: No family/caregiver present to determine baseline, Orientation, Awareness, Memory,  Attention, Problem solving, Safety/Judgement   Orientation impairments: Place, Time, Situation                   PT - Cognition Comments: Pt able to follow commands, but not aware of place, time, situation, or safety related to his injury. Following commands: Impaired Following commands impaired: Follows one step commands inconsistently, Follows one step commands with increased time    Cueing Cueing Techniques: Verbal cues, Tactile cues, Visual cues, Gestural cues  Exercises      General Comments General comments (skin integrity, edema, etc.): corrected position of sling, placed on blankets for increased support      Pertinent Vitals/Pain Pain Assessment Pain Assessment: Faces Faces Pain Scale: Hurts even more Pain Location: RUE, generalized Pain Descriptors / Indicators: Grimacing, Guarding Pain Intervention(s): Limited activity within patient's tolerance, Monitored during session, Repositioned    Home Living                          Prior Function            PT Goals (current goals can now be found in the care plan section) Acute Rehab PT Goals Patient Stated Goal: did not state Progress towards PT goals: Progressing toward goals    Frequency    Min 1X/week      PT Plan      Co-evaluation PT/OT/SLP Co-Evaluation/Treatment: Yes Reason for Co-Treatment: For patient/therapist safety;To address functional/ADL transfers;Necessary to address cognition/behavior during functional activity PT goals addressed during session: Mobility/safety with mobility;Balance OT goals addressed during session: ADL's and self-care      AM-PAC PT "6 Clicks" Mobility   Outcome Measure  Help needed turning from your back to your side while in a flat bed without using bedrails?: A Lot Help needed moving from lying on your back to sitting on the side of a flat bed without using bedrails?: A Lot Help needed moving to and from a bed to a chair (including a wheelchair)?:  A Lot Help needed standing up from a chair using your arms (e.g., wheelchair or bedside chair)?: A Lot Help needed to walk in hospital room?: Total Help needed climbing 3-5 steps with a railing? : Total 6 Click Score: 10    End of Session Equipment Utilized During Treatment: Gait belt;Other (comment) (R UE sling) Activity Tolerance: Patient limited by pain Patient left: in chair;with call bell/phone within reach;with chair alarm set;with restraints reapplied Nurse Communication: Mobility status PT Visit Diagnosis: Unsteadiness on feet (R26.81);Muscle weakness (generalized) (M62.81);History of falling (Z91.81);Pain Pain - Right/Left: Right Pain - part of body: Arm     Time: 1120-1156 PT Time Calculation (min) (ACUTE ONLY): 36 min  Charges:    $Therapeutic Activity: 8-22 mins PT General Charges $$ ACUTE PT VISIT: 1 Visit                    Corinna Capra, PT, DPT  Acute Rehabilitation Secure chat is best for contact #[phone] office    02/20/2024, 3:30 PM

## 2024-02-20 NOTE — TOC Transition Note (Signed)
 Transition of Care [facility]) - Discharge Note   Patient Details  Name: [name] MRN: [id number] Date of Birth: [birth year]  Transition of Care [facility]) CM/SW Contact:  Lorri Frederick, LCSW Phone Number: 02/20/2024, 2:01 PM   Clinical Narrative:   Pt discharging to [facility], room 100.  RN call report to [phone].     Final next level of care: Skilled Nursing Facility Barriers to Discharge: Barriers Resolved   Patient Goals and CMS Choice     Choice offered to / list presented to : Adult Children      Discharge Placement              Patient chooses bed at: [facility] Patient to be transferred to facility by: ptar Name of family member notified: [name] Patient and family notified of of transfer: 02/20/24  Discharge Plan and Services Additional resources added to the After Visit Summary for     Discharge Planning Services: CM Consult                                 Social Drivers of Health (SDOH) Interventions SDOH Screenings   Food Insecurity: No Food Insecurity (02/17/2024)  Housing: Unknown (02/17/2024)  Transportation Needs: No Transportation Needs (02/17/2024)  Utilities: Not At Risk (02/17/2024)  Financial Resource Strain: Low Risk  (02/02/2022)   Received from [facility], [facility]  Physical Activity: Inactive (02/02/2022)   Received from [facility], [facility]  Social Connections: Socially Isolated (02/02/2022)   Received from [facility], [facility]  Stress: No Stress Concern Present (02/02/2022)   Received from [facility], [facility]  Tobacco Use: Medium Risk (02/14/2024)     Readmission Risk Interventions    01/21/2023    9:13 AM  Readmission Risk Prevention Plan  Transportation Screening Complete  Medication Review (RN Care Manager) Complete  PCP or Specialist  appointment within 3-5 days of discharge Complete  HRI or Home Care Consult Complete  SW Recovery Care/Counseling Consult Complete  Palliative Care Screening Not Applicable  Skilled Nursing Facility Complete

## 2024-02-20 NOTE — Progress Notes (Signed)
 DISCHARGE NOTE SNF [name] to be discharged Rehab per MD order. Patient verbalized understanding.  Skin clean, dry and intact without evidence of skin break down, no evidence of skin tears noted. IV catheter discontinued intact. Site without signs and symptoms of complications. Dressing and pressure applied. Pt denies pain at the site currently. No complaints noted.  Patient free of lines, drains, and wounds.   Discharge packet assembled. An After Visit Summary (AVS) was printed and given to the EMS personnel. Patient escorted via stretcher and discharged to [facility] via ambulance. Report called to accepting facility by floor nurse all questions and concerns addressed.   Velia Meyer, RN

## 2024-02-24 ENCOUNTER — Emergency Department (HOSPITAL_COMMUNITY): Payer: Medicare Other

## 2024-02-24 ENCOUNTER — Encounter (HOSPITAL_COMMUNITY): Payer: Self-pay

## 2024-02-24 ENCOUNTER — Emergency Department (HOSPITAL_COMMUNITY)
Admission: EM | Admit: 2024-02-24 | Discharge: 2024-02-25 | Disposition: A | Discharge status: Home / Self Care | Payer: Medicare Other | Attending: Emergency Medicine | Admitting: Emergency Medicine

## 2024-02-24 ENCOUNTER — Other Ambulatory Visit: Payer: Self-pay

## 2024-02-24 DIAGNOSIS — E119 Type 2 diabetes mellitus without complications: Secondary | ICD-10-CM | POA: Diagnosis not present

## 2024-02-24 DIAGNOSIS — S42291D Other displaced fracture of upper end of right humerus, subsequent encounter for fracture with routine healing: Secondary | ICD-10-CM | POA: Diagnosis not present

## 2024-02-24 DIAGNOSIS — Z85810 Personal history of malignant neoplasm of tongue: Secondary | ICD-10-CM | POA: Diagnosis not present

## 2024-02-24 DIAGNOSIS — W19XXXD Unspecified fall, subsequent encounter: Secondary | ICD-10-CM | POA: Diagnosis not present

## 2024-02-24 DIAGNOSIS — Z7901 Long term (current) use of anticoagulants: Secondary | ICD-10-CM | POA: Diagnosis not present

## 2024-02-24 DIAGNOSIS — S4991XD Unspecified injury of right shoulder and upper arm, subsequent encounter: Secondary | ICD-10-CM | POA: Diagnosis present

## 2024-02-24 DIAGNOSIS — Z79899 Other long term (current) drug therapy: Secondary | ICD-10-CM | POA: Diagnosis not present

## 2024-02-24 DIAGNOSIS — S42351D Displaced comminuted fracture of shaft of humerus, right arm, subsequent encounter for fracture with routine healing: Secondary | ICD-10-CM

## 2024-02-24 DIAGNOSIS — R600 Localized edema: Secondary | ICD-10-CM | POA: Diagnosis not present

## 2024-02-24 DIAGNOSIS — Z794 Long term (current) use of insulin: Secondary | ICD-10-CM | POA: Diagnosis not present

## 2024-02-24 MED ORDER — OXYCODONE HCL 10 MG PO TABS: 10.0000 mg | ORAL_TABLET | Freq: Four times a day (QID) | ORAL | 0 refills | Status: DC | PRN

## 2024-02-24 MED ORDER — OXYCODONE HCL 5 MG PO TABS
10.0000 mg | ORAL_TABLET | Freq: Once | ORAL | Status: AC
  Administered 2024-02-24: 10 mg via ORAL
  Filled 2024-02-24: qty 2

## 2024-02-24 NOTE — ED Provider Notes (Signed)
 [place] EMERGENCY DEPARTMENT AT [facility] Provider Note   CSN: [id number] Arrival date & time: 02/24/24  1823     History  Chief Complaint  Patient presents with   Arm Injury    [name] is a 78 y.o. male.  The history is provided by the patient, the EMS personnel and medical records. No language interpreter was used.  Arm Injury Associated symptoms: no fever      78 year old male history of tongue cancer status post chemoradiation, prior stroke with blood thinner medication, diabetes, AAA, PE DVT, recent right shoulder injury from a mechanical fall for which he suffered a right humeral head fracture which she has been treated nonoperatively presenting with complaint of right arm pain.  Patient resides at [facility] facility.  He has been sent here due to increasing pain and swelling about his right arm.  No report of any new injury or change in patient care.  No other complaint.  Patient is requesting for pain medication.  Remainder of history is limited but patient without other complaint.  Home Medications Prior to Admission medications   Medication Sig Start Date End Date Taking? Authorizing Provider  abatacept (ORENCIA) 250 MG injection Inject 250 mg into the vein every 30 (thirty) days. Patient taking differently: Inject 750 mg into the vein every 30 (thirty) days. 02/12/21   Evlyn Kanner, MD  amLODipine (NORVASC) 5 MG tablet Take 1 tablet (5 mg total) by mouth daily. Patient taking differently: Place 5 mg into feeding tube daily. 05/23/23   Rhetta Mura, MD  atorvastatin (LIPITOR) 40 MG tablet Place 1 tablet (40 mg total) into feeding tube daily. 01/28/23   Marrianne Mood, MD  citalopram (CELEXA) 10 MG/5ML suspension Place 10 mg into feeding tube daily.    [provider]  diclofenac Sodium (VOLTAREN) 1 % GEL Apply 2 g topically as needed (Pain).    [provider]  famotidine (PEPCID) 40 MG/5ML suspension Place  40 mg into feeding tube every 12 (twelve) hours.    [provider]  Feeding Supplies KIT 1 Box by Does not apply route daily. Patient will need pump and supplies for continuous tube feeds and 60mL syringes for free water flushes 12/31/22   Mapp, Gaylyn Cheers, MD  ferrous sulfate 300 (60 Fe) MG/5ML syrup Place 5 mLs (300 mg total) into feeding tube daily. Patient taking differently: Place 5 mLs into feeding tube daily. 01/28/23   Marrianne Mood, MD  fludrocortisone (FLORINEF) 0.1 MG tablet Place 1 tablet (0.1 mg total) into feeding tube daily. 01/29/23   Marrianne Mood, MD  fluticasone (FLONASE) 50 MCG/ACT nasal spray Place 2 sprays into both nostrils daily as needed for allergies or rhinitis.    [provider]  folic acid (FOLVITE) 1 MG tablet Place 1 tablet (1 mg total) into feeding tube daily. 01/28/23   Marrianne Mood, MD  insulin aspart (NOVOLOG) 100 UNIT/ML FlexPen Inject 0-9 Units into the skin 3 (three) times daily with meals. Blood Glucose 121 - 150: 1 units  BG 151 - 200: 2 units  BG 201 - 250: 3 units  BG 251 - 300: 5 units  BG 301 - 350: 7 units  BG 351 - 400: 9 units 02/20/24   Noralee Stain, DO  insulin detemir (LEVEMIR) 100 UNIT/ML FlexPen Inject 15 Units into the skin at bedtime. 02/20/24   Noralee Stain, DO  levothyroxine (SYNTHROID) 50 MCG tablet Place 1 tablet (50 mcg total) into feeding tube daily. 01/28/23  Marrianne Mood, MD  magnesium hydroxide (MILK OF MAGNESIA) 400 MG/5ML suspension Take 5 mLs by mouth daily as needed for mild constipation or moderate constipation.    [provider]  magnesium oxide (MAG-OX) 400 (240 Mg) MG tablet Place 1 tablet (400 mg total) into feeding tube daily. 01/28/23   Marrianne Mood, MD  metFORMIN (GLUCOPHAGE) 500 MG tablet Place 1 tablet (500 mg total) into feeding tube daily. 01/28/23   Marrianne Mood, MD  methotrexate 50 MG/2ML injection Inject 15 mg into the skin once a week. Every Monday. 04/25/23 04/17/30   [provider]  naloxone [facility]) nasal spray 4 mg/0.1 mL Place 1 spray into the nose daily as needed (Respiratory distress from Overdose.). 04/25/23   [provider]  oxyCODONE (OXY IR/ROXICODONE) 5 MG immediate release tablet Place 1 tablet (5 mg total) into feeding tube every 6 (six) hours as needed for severe pain (pain score 7-10) or breakthrough pain. 02/20/24   Noralee Stain, DO  predniSONE (DELTASONE) 5 MG tablet Take 5 mg by mouth daily. 07/18/23   [provider]  sucralfate (CARAFATE) 1 GM/10ML suspension Place 10 mLs (1 g total) into feeding tube every 6 (six) hours. Patient taking differently: Place 1 g into feeding tube every 6 (six) hours. 01/28/23   Marrianne Mood, MD  Water For Irrigation, Sterile (FREE WATER) SOLN Place 200 mLs into feeding tube 6 (six) times daily. 05/23/23   Rhetta Mura, MD      Allergies    Patient has no known allergies.    Review of Systems   Review of Systems  Constitutional:  Negative for fever.  Musculoskeletal:  Positive for arthralgias.    Physical Exam Updated Vital Signs BP (!) 103/54 (BP Location: Left Arm)   Pulse 81   Temp 98.3 F (36.8 C) (Oral)   Resp 18   Ht 6\' 2"  (1.88 m)   Wt 77.5 kg   SpO2 100%   BMI 21.94 kg/m  Physical Exam Vitals and nursing note reviewed.  Constitutional:      Comments: Elderly male laying in bed, appears to be in no acute discomfort.  He is wearing a sling to his right arm.  HENT:     Head: Normocephalic and atraumatic.     Mouth/Throat:     Mouth: Mucous membranes are dry.  Cardiovascular:     Rate and Rhythm: Normal rate and regular rhythm.     Pulses: Normal pulses.     Heart sounds: Normal heart sounds.  Pulmonary:     Effort: Pulmonary effort is normal.     Breath sounds: Normal breath sounds.  Abdominal:     Palpations: Abdomen is soft.  Musculoskeletal:        General: Signs of injury (Right time: Ecchymosis noted to right shoulder and right humeral  region with tenderness to palpation.  Dependent edema noted to right forearm.  Radial pulse 2+ with brisk cap refill to fingers.) present.     Cervical back: Normal range of motion and neck supple.  Neurological:     Comments: Alert and oriented x 1     ED Results / Procedures / Treatments   Labs (all labs ordered are listed, but only abnormal results are displayed) Labs Reviewed - No data to display  EKG None  Radiology DG Shoulder Right Result Date: 02/24/2024 CLINICAL DATA:  Increasing pain, recent fracture. EXAM: RIGHT SHOULDER - 2+ VIEW COMPARISON:  Shoulder CT 02/13/2014 and radiographs 02/13/2014 FINDINGS: No significant change from 02/13/2014.  Comminuted and impacted proximal humerus fracture. The dominant fracture plane is through the anatomic neck. No dislocation. IMPRESSION: Unchanged comminuted impacted fracture of the right humeral head. Electronically Signed   By: Minerva Fester M.D.   On: 02/24/2024 21:47    Procedures Procedures    Medications Ordered in ED Medications  oxyCODONE (Oxy IR/ROXICODONE) immediate release tablet 10 mg (10 mg Oral Given 02/24/24 1920)    ED Course/ Medical Decision Making/ A&P                                 Medical Decision Making Amount and/or Complexity of Data Reviewed Radiology: ordered.  Risk Prescription drug management.   BP (!) 103/54 (BP Location: Left Arm)   Pulse 81   Temp 98.3 F (36.8 C) (Oral)   Resp 18   Ht 6\' 2"  (1.88 m)   Wt 77.5 kg   SpO2 100%   BMI 21.94 kg/m   60:34 PM  78 year old male history of tongue cancer status post chemoradiation, prior stroke with blood thinner medication, diabetes, AAA, PE DVT, recent right shoulder injury from a mechanical fall for which he suffered a right humeral head fracture which she has been treated nonoperatively presenting with complaint of right arm pain.  Patient resides at [facility] facility.  He has been sent here due to increasing pain and swelling  about his right arm.  No report of any new injury or change in patient care.  No other complaint.  Patient is requesting for pain medication.  Remainder of history is limited but patient without other complaint.  Exam notable for ecchymosis and tenderness about the right arm from the shoulder down to the elbow without any elbow tenderness.  Patient has some dependent edema to right forearm.  Radial pulse 2+.  EMR review, patient had a CT scan of his right shoulder that was performed on 02/14/2024 which demonstrate comminuted impacted fracture of the right humeral head with an oblique fracture involving the surgical neck of the humerus and the lateral angulation and impaction.  Patient also has extensive subcutaneous edema to the right upper extremity on CT scan.  It appears his presentation today is quite similar to previous injury.  Will repeat shoulder x-ray for further assessment.  Pain medication given.  10:18 PM Right shoulder x-ray obtained independent viewed interpreted by me showing unchanged comminuted impacted fracture of the right humeral head.  Patient receiving opiate pain medication with better pain management.  I have reached out and spoke to patient's son over the phone who was made aware of his condition.  Per son, patient is currently taking oxycodone 5 mg as needed for pain.  Patient does take opiate pain medication at a higher dose in the past as his opiate tolerance is different.  We agree patient would likely benefit from better pain management for his injury.  I will increase his oxycodone to 10 mg every 6 hours as needed for pain.        Final Clinical Impression(s) / ED Diagnoses Final diagnoses:  Closed comminuted fracture of right humerus with routine healing, subsequent encounter    Rx / DC Orders ED Discharge Orders          Ordered    oxyCODONE 10 MG TABS  Every 6 hours PRN        02/24/24 2228  Fayrene Helper, PA-C 02/24/24 2230    Loetta Rough, MD 03/01/24 2250

## 2024-02-24 NOTE — ED Notes (Signed)
 PT back from xray

## 2024-02-24 NOTE — Discharge Instructions (Signed)
 Repeat imaging of your right shoulder did not show any new changes to your broken arm.  I have increased your pain medication to 10 mg of oxycodone every 6 hours as needed for pain control.  You may follow-up closely with your orthopedic specialist for further care.  Continue to wear your sling for support.  Keep your arm elevated above your heart when possible for improvement of swelling and for comfort.

## 2024-02-24 NOTE — ED Triage Notes (Signed)
 Patient is here for evaluation of right arm pain with increase pain and swelling. Pt resides at [facility] and reports that patient was diagnosed with a right humeral fracture and noticed today an increase in pain and swelling to the arm. Denies any injuries or changes in patients care. Patient is A&O X 1, which is patient's baseline.

## 2024-03-19 ENCOUNTER — Emergency Department (HOSPITAL_COMMUNITY)
Admission: EM | Admit: 2024-03-19 | Discharge: 2024-03-27 | Disposition: E | Attending: Emergency Medicine | Admitting: Emergency Medicine

## 2024-03-19 ENCOUNTER — Other Ambulatory Visit: Payer: Self-pay

## 2024-03-19 ENCOUNTER — Emergency Department (HOSPITAL_COMMUNITY)

## 2024-03-19 ENCOUNTER — Encounter (HOSPITAL_COMMUNITY): Payer: Self-pay

## 2024-03-19 DIAGNOSIS — R0682 Tachypnea, not elsewhere classified: Secondary | ICD-10-CM | POA: Diagnosis not present

## 2024-03-19 DIAGNOSIS — A419 Sepsis, unspecified organism: Secondary | ICD-10-CM | POA: Diagnosis not present

## 2024-03-19 DIAGNOSIS — Z79899 Other long term (current) drug therapy: Secondary | ICD-10-CM | POA: Diagnosis not present

## 2024-03-19 DIAGNOSIS — R4182 Altered mental status, unspecified: Secondary | ICD-10-CM | POA: Diagnosis present

## 2024-03-19 DIAGNOSIS — Z66 Do not resuscitate: Secondary | ICD-10-CM | POA: Insufficient documentation

## 2024-03-19 DIAGNOSIS — Z794 Long term (current) use of insulin: Secondary | ICD-10-CM | POA: Diagnosis not present

## 2024-03-19 DIAGNOSIS — I959 Hypotension, unspecified: Secondary | ICD-10-CM | POA: Insufficient documentation

## 2024-03-19 LAB — CBC WITH DIFFERENTIAL/PLATELET
Abs Immature Granulocytes: 1.43 10*3/uL — ABNORMAL HIGH (ref 0.00–0.07)
Basophils Absolute: 0 10*3/uL (ref 0.0–0.1)
Basophils Relative: 0 %
Eosinophils Absolute: 0.1 10*3/uL (ref 0.0–0.5)
Eosinophils Relative: 1 %
HCT: 15.4 % — ABNORMAL LOW (ref 39.0–52.0)
Hemoglobin: 4.4 g/dL — CL (ref 13.0–17.0)
Immature Granulocytes: 6 %
Lymphocytes Relative: 13 %
Lymphs Abs: 3 10*3/uL (ref 0.7–4.0)
MCH: 27.3 pg (ref 26.0–34.0)
MCHC: 28.6 g/dL — ABNORMAL LOW (ref 30.0–36.0)
MCV: 95.7 fL (ref 80.0–100.0)
Monocytes Absolute: 1.3 10*3/uL — ABNORMAL HIGH (ref 0.1–1.0)
Monocytes Relative: 6 %
Neutro Abs: 17.6 10*3/uL — ABNORMAL HIGH (ref 1.7–7.7)
Neutrophils Relative %: 74 %
Platelets: 231 10*3/uL (ref 150–400)
RBC: 1.61 MIL/uL — ABNORMAL LOW (ref 4.22–5.81)
RDW: 19.2 % — ABNORMAL HIGH (ref 11.5–15.5)
WBC: 23.4 10*3/uL — ABNORMAL HIGH (ref 4.0–10.5)
nRBC: 1.5 % — ABNORMAL HIGH (ref 0.0–0.2)

## 2024-03-19 LAB — I-STAT CG4 LACTIC ACID, ED: Lactic Acid, Venous: >15 mmol/L (ref 0.5–1.9)

## 2024-03-19 LAB — CBG MONITORING, ED: Glucose-Capillary: 247 mg/dL — ABNORMAL HIGH (ref 70–99)

## 2024-03-19 LAB — PROTIME-INR
INR: 7.8 (ref 0.8–1.2)
Prothrombin Time: 65.7 s — ABNORMAL HIGH (ref 11.4–15.2)

## 2024-03-19 LAB — RESP PANEL BY RT-PCR (RSV, FLU A&B, COVID)  RVPGX2
Influenza A by PCR: NEGATIVE
Influenza B by PCR: NEGATIVE
Resp Syncytial Virus by PCR: NEGATIVE
SARS Coronavirus 2 by RT PCR: NEGATIVE

## 2024-03-19 LAB — APTT: aPTT: 121 s — ABNORMAL HIGH (ref 24–36)

## 2024-03-19 MED ORDER — LORAZEPAM 2 MG/ML IJ SOLN
1.0000 mg | INTRAMUSCULAR | Status: DC | PRN
  Administered 2024-03-19: 1 mg via INTRAVENOUS
  Filled 2024-03-19: qty 1

## 2024-03-19 MED ORDER — LACTATED RINGERS IV SOLN: INTRAVENOUS | Status: DC

## 2024-03-19 MED ORDER — SODIUM CHLORIDE 0.9 % IV SOLN
2.0000 g | Freq: Once | INTRAVENOUS | Status: AC
  Administered 2024-03-19: 2 g via INTRAVENOUS
  Filled 2024-03-19: qty 20

## 2024-03-19 MED ORDER — SODIUM CHLORIDE 0.9 % IV BOLUS (SEPSIS)
1000.0000 mL | Freq: Once | INTRAVENOUS | Status: AC
  Administered 2024-03-19: 1000 mL via INTRAVENOUS

## 2024-03-19 MED ORDER — ACETAMINOPHEN 650 MG RE SUPP
650.0000 mg | Freq: Once | RECTAL | Status: AC
  Administered 2024-03-19: 650 mg via RECTAL
  Filled 2024-03-19: qty 1

## 2024-03-19 MED ORDER — SODIUM CHLORIDE 0.9 % IV SOLN
500.0000 mg | Freq: Once | INTRAVENOUS | Status: AC
  Administered 2024-03-19: 500 mg via INTRAVENOUS
  Filled 2024-03-19: qty 5

## 2024-03-19 MED ORDER — MORPHINE SULFATE (PF) 4 MG/ML IV SOLN
4.0000 mg | INTRAVENOUS | Status: DC | PRN
  Administered 2024-03-19: 4 mg via INTRAVENOUS
  Filled 2024-03-19: qty 1

## 2024-03-19 MED ORDER — SODIUM CHLORIDE 0.9 % IV BOLUS (SEPSIS): 500.0000 mL | Freq: Once | INTRAVENOUS | Status: DC

## 2024-03-24 LAB — CULTURE, BLOOD (ROUTINE X 2): Culture: NO GROWTH

## 2024-03-27 NOTE — ED Triage Notes (Addendum)
 EMS reports from [facility] being treated for PNA, staff states worsening. Pt hypotensive, tachycardic on arrival.  BP 64/36 HR 112 RR 24 Sp02 85 non rebreather @ 10 ltrs CBG 340  20 L hand 22 R hand  NS  Epinephrine frip on arrival.

## 2024-03-27 NOTE — Sepsis Progress Note (Signed)
 Notified bedside nurse of need to draw repeat lactic acid.

## 2024-03-27 NOTE — ED Provider Notes (Addendum)
 [place] EMERGENCY DEPARTMENT AT [facility] Provider Note   CSN: [id number] Arrival date & time: 03/11/2024  7829     History  Chief Complaint  Patient presents with   Hypoxia   Tachycardia   Hypotension    [name] is a 78 y.o. male.  78 year old male presents from nursing facility due to altered mental status.  Patient has a valid DNR.  Patient reportedly being treated for pneumonia.  Patient found be hypotensive and tachycardic.  He was also with decreased level consciousness.  EMS was called and patient was hypotensive and tachypneic.  Was started on IV fluids and transition to an epinephrine drip.  He was also placed on nonrebreather and transported here.  Review of the charts from nursing home shows that he has had decreasing health recently and had a palliative care consult       Home Medications Prior to Admission medications   Medication Sig Start Date End Date Taking? Authorizing Provider  abatacept (ORENCIA) 250 MG injection Inject 250 mg into the vein every 30 (thirty) days. Patient taking differently: Inject 750 mg into the vein every 30 (thirty) days. 02/12/21   Evlyn Kanner, MD  amLODipine (NORVASC) 5 MG tablet Take 1 tablet (5 mg total) by mouth daily. Patient taking differently: Place 5 mg into feeding tube daily. 05/23/23   Rhetta Mura, MD  atorvastatin (LIPITOR) 40 MG tablet Place 1 tablet (40 mg total) into feeding tube daily. 01/28/23   Marrianne Mood, MD  citalopram (CELEXA) 10 MG/5ML suspension Place 10 mg into feeding tube daily.    [provider]  diclofenac Sodium (VOLTAREN) 1 % GEL Apply 2 g topically as needed (Pain).    [provider]  famotidine (PEPCID) 40 MG/5ML suspension Place 40 mg into feeding tube every 12 (twelve) hours.    [provider]  Feeding Supplies KIT 1 Box by Does not apply route daily. Patient will need pump and supplies for continuous tube feeds and 60mL syringes for  free water flushes 12/31/22   Mapp, Gaylyn Cheers, MD  ferrous sulfate 300 (60 Fe) MG/5ML syrup Place 5 mLs (300 mg total) into feeding tube daily. Patient taking differently: Place 5 mLs into feeding tube daily. 01/28/23   Marrianne Mood, MD  fludrocortisone (FLORINEF) 0.1 MG tablet Place 1 tablet (0.1 mg total) into feeding tube daily. 01/29/23   Marrianne Mood, MD  fluticasone (FLONASE) 50 MCG/ACT nasal spray Place 2 sprays into both nostrils daily as needed for allergies or rhinitis.    [provider]  folic acid (FOLVITE) 1 MG tablet Place 1 tablet (1 mg total) into feeding tube daily. 01/28/23   Marrianne Mood, MD  insulin aspart (NOVOLOG) 100 UNIT/ML FlexPen Inject 0-9 Units into the skin 3 (three) times daily with meals. Blood Glucose 121 - 150: 1 units  BG 151 - 200: 2 units  BG 201 - 250: 3 units  BG 251 - 300: 5 units  BG 301 - 350: 7 units  BG 351 - 400: 9 units 02/20/24   Noralee Stain, DO  insulin detemir (LEVEMIR) 100 UNIT/ML FlexPen Inject 15 Units into the skin at bedtime. 02/20/24   Noralee Stain, DO  levothyroxine (SYNTHROID) 50 MCG tablet Place 1 tablet (50 mcg total) into feeding tube daily. 01/28/23   Marrianne Mood, MD  magnesium hydroxide (MILK OF MAGNESIA) 400 MG/5ML suspension Take 5 mLs by mouth daily as needed for mild constipation or moderate constipation.    [provider]  magnesium oxide (MAG-OX) 400 (240 Mg) MG tablet Place 1 tablet (400 mg total) into feeding tube daily. 01/28/23   Marrianne Mood, MD  metFORMIN (GLUCOPHAGE) 500 MG tablet Place 1 tablet (500 mg total) into feeding tube daily. 01/28/23   Marrianne Mood, MD  methotrexate 50 MG/2ML injection Inject 15 mg into the skin once a week. Every Monday. 04/25/23 04/17/30  [provider]  naloxone [facility]) nasal spray 4 mg/0.1 mL Place 1 spray into the nose daily as needed (Respiratory distress from Overdose.). 04/25/23   [provider]  oxyCODONE 10 MG TABS Place 1 tablet (10  mg total) into feeding tube every 6 (six) hours as needed for severe pain (pain score 7-10) or breakthrough pain. 02/24/24   Fayrene Helper, PA-C  predniSONE (DELTASONE) 5 MG tablet Take 5 mg by mouth daily. 07/18/23   [provider]  sucralfate (CARAFATE) 1 GM/10ML suspension Place 10 mLs (1 g total) into feeding tube every 6 (six) hours. Patient taking differently: Place 1 g into feeding tube every 6 (six) hours. 01/28/23   Marrianne Mood, MD  Water For Irrigation, Sterile (FREE WATER) SOLN Place 200 mLs into feeding tube 6 (six) times daily. 05/23/23   Rhetta Mura, MD      Allergies    Patient has no known allergies.    Review of Systems   Review of Systems  Unable to perform ROS: Mental status change    Physical Exam Updated Vital Signs BP (!) 70/37   Pulse (!) 105   Temp (!) 102.2 F (39 C) (Rectal)   Resp (!) 25   SpO2 100%  Physical Exam Vitals and nursing note reviewed.  Constitutional:      General: He is in acute distress.     Appearance: He is cachectic. He is ill-appearing and toxic-appearing.  HENT:     Head: Normocephalic and atraumatic.  Eyes:     General: Lids are normal.     Conjunctiva/sclera: Conjunctivae normal.     Pupils: Pupils are equal, round, and reactive to light.  Neck:     Thyroid: No thyroid mass.     Trachea: No tracheal deviation.  Cardiovascular:     Rate and Rhythm: Normal rate and regular rhythm.     Heart sounds: Normal heart sounds. No murmur heard.    No gallop.  Pulmonary:     Effort: Tachypnea, prolonged expiration and respiratory distress present.     Breath sounds: No stridor. Examination of the right-upper field reveals decreased breath sounds and rhonchi. Examination of the left-upper field reveals decreased breath sounds and rhonchi. Decreased breath sounds and rhonchi present. No wheezing or rales.  Abdominal:     General: There is no distension.     Palpations: Abdomen is soft.     Tenderness: There is no  abdominal tenderness. There is no rebound.  Musculoskeletal:        General: No tenderness. Normal range of motion.     Cervical back: Normal range of motion and neck supple.  Skin:    General: Skin is warm and dry.     Findings: No abrasion or rash.  Neurological:     Mental Status: He is disoriented, confused and unresponsive.     GCS: GCS eye subscore is 4. GCS verbal subscore is 2. GCS motor subscore is 1.     Motor: Atrophy present.  Psychiatric:        Attention and Perception: He is inattentive.     ED Results /  Procedures / Treatments   Labs (all labs ordered are listed, but only abnormal results are displayed) Labs Reviewed  CBG MONITORING, ED - Abnormal; Notable for the following components:      Result Value   Glucose-Capillary 247 (*)    All other components within normal limits  RESP PANEL BY RT-PCR (RSV, FLU A&B, COVID)  RVPGX2  CULTURE, BLOOD (ROUTINE X 2)  CULTURE, BLOOD (ROUTINE X 2)  COMPREHENSIVE METABOLIC PANEL  CBC WITH DIFFERENTIAL/PLATELET  PROTIME-INR  APTT  URINALYSIS, W/ REFLEX TO CULTURE (INFECTION SUSPECTED)  I-STAT CG4 LACTIC ACID, ED    EKG None  Radiology No results found.  Procedures Procedures    Medications Ordered in ED Medications  lactated ringers infusion (has no administration in time range)  sodium chloride 0.9 % bolus 1,000 mL (has no administration in time range)    And  sodium chloride 0.9 % bolus 1,000 mL (has no administration in time range)    And  sodium chloride 0.9 % bolus 500 mL (has no administration in time range)  cefTRIAXone (ROCEPHIN) 2 g in sodium chloride 0.9 % 100 mL IVPB (has no administration in time range)  azithromycin (ZITHROMAX) 500 mg in sodium chloride 0.9 % 250 mL IVPB (has no administration in time range)  acetaminophen (TYLENOL) suppository 650 mg (has no administration in time range)    ED Course/ Medical Decision Making/ A&P                                 Medical Decision  Making Amount and/or Complexity of Data Reviewed Labs: ordered. Radiology: ordered.  Risk OTC drugs. Prescription drug management.   Patient presents via EMS in extremis.  Patient has a valid DNR.  I discussed with the patient's [name] who confirms that patient is a DNR.  He will come here and see the patient soon as possible.  Patient is febrile here and given Tylenol rectally.  Patient started on sepsis protocol and started on IV antibiotics.  Patient started on BiPAP for ventilatory support.  12:31 PM Had long discussion with all family members.  They have decided to make the patient comfort measures only.  Patient's vasopressors stopped as well as placed on nasal cannula.  1:28 PM Patient pronounced deceased at 1:15 PM.  Will contact PCP to have him sign a the death certificate.  His name is Dr. Antoine Primas in [facility] with [facility]  CRITICAL CARE Performed by: Toy Baker Total critical care time: 80 minutes Critical care time was exclusive of separately billable procedures and treating other patients. Critical care was necessary to treat or prevent imminent or life-threatening deterioration. Critical care was time spent personally by me on the following activities: development of treatment plan with patient and/or surrogate as well as nursing, discussions with consultants, evaluation of patient's response to treatment, examination of patient, obtaining history from patient or surrogate, ordering and performing treatments and interventions, ordering and review of laboratory studies, ordering and review of radiographic studies, pulse oximetry and re-evaluation of patient's condition.         Final Clinical Impression(s) / ED Diagnoses Final diagnoses:  None    Rx / DC Orders ED Discharge Orders     None         Lorre Nick, MD 02/29/2024 1328    Lorre Nick, MD 02/25/2024 1341    Lorre Nick, MD 03/15/2024 1342

## 2024-03-27 NOTE — ED Notes (Signed)
 Pt expired @ 1304

## 2024-03-27 NOTE — ED Notes (Signed)
 I called Dr Antoine Primas for Dr Freida Busman [phone]

## 2024-03-27 NOTE — Progress Notes (Signed)
 RT PLACED PATIENT ON BIPAP PER MD ORDER 10/5  100%  WHILE IN ED. PATIENT IS A DNR. RN AT BEDSIDE AT THIS TIME.

## 2024-03-27 NOTE — Sepsis Progress Note (Signed)
 Elink monitoring for the code sepsis protocol.

## 2024-03-27 NOTE — ED Notes (Signed)
Pt placement notified  

## 2024-03-27 DEATH — deceased
